# Patient Record
Sex: Female | Born: 1945
Health system: Southern US, Community
[De-identification: ages and names within clinical notes are randomized; demographics above are authoritative.]

## PROBLEM LIST (undated history)

## (undated) DIAGNOSIS — T451X5A Adverse effect of antineoplastic and immunosuppressive drugs, initial encounter: Secondary | ICD-10-CM

## (undated) DIAGNOSIS — E785 Hyperlipidemia, unspecified: Secondary | ICD-10-CM

## (undated) DIAGNOSIS — R234 Changes in skin texture: Secondary | ICD-10-CM

## (undated) DIAGNOSIS — F419 Anxiety disorder, unspecified: Secondary | ICD-10-CM

## (undated) DIAGNOSIS — R7303 Prediabetes: Secondary | ICD-10-CM

## (undated) DIAGNOSIS — E079 Disorder of thyroid, unspecified: Secondary | ICD-10-CM

## (undated) DIAGNOSIS — T7840XA Allergy, unspecified, initial encounter: Secondary | ICD-10-CM

## (undated) DIAGNOSIS — C801 Malignant (primary) neoplasm, unspecified: Secondary | ICD-10-CM

## (undated) DIAGNOSIS — I10 Essential (primary) hypertension: Secondary | ICD-10-CM

## (undated) DIAGNOSIS — G4700 Insomnia, unspecified: Secondary | ICD-10-CM

## (undated) DIAGNOSIS — E039 Hypothyroidism, unspecified: Secondary | ICD-10-CM

## (undated) DIAGNOSIS — L270 Generalized skin eruption due to drugs and medicaments taken internally: Secondary | ICD-10-CM

## (undated) DIAGNOSIS — C3492 Malignant neoplasm of unspecified part of left bronchus or lung: Principal | ICD-10-CM

## (undated) HISTORY — DX: Insomnia, unspecified: G47.00

## (undated) HISTORY — DX: Hyperlipidemia, unspecified: E78.5

## (undated) HISTORY — DX: Disorder of thyroid, unspecified: E07.9

## (undated) HISTORY — DX: Allergy, unspecified, initial encounter: T78.40XA

## (undated) HISTORY — DX: Prediabetes: R73.03

## (undated) HISTORY — DX: Changes in skin texture: R23.4

## (undated) HISTORY — DX: Generalized skin eruption due to drugs and medicaments taken internally: L27.0

## (undated) HISTORY — DX: Anxiety disorder, unspecified: F41.9

## (undated) HISTORY — PX: CYST REMOVAL HAND: SHX6279

## (undated) HISTORY — DX: Essential (primary) hypertension: I10

## (undated) HISTORY — DX: Malignant neoplasm of unspecified part of left bronchus or lung: C34.92

## (undated) HISTORY — DX: Adverse effect of antineoplastic and immunosuppressive drugs, initial encounter: T45.1X5A

## (undated) HISTORY — PX: TONSILLECTOMY AND ADENOIDECTOMY: SUR1326

---

## 1993-10-23 HISTORY — PX: ABDOMINAL HYSTERECTOMY: SHX81

## 1998-04-07 ENCOUNTER — Ambulatory Visit (HOSPITAL_COMMUNITY): Admission: RE | Admit: 1998-04-07 | Discharge: 1998-04-07 | Payer: Self-pay | Admitting: *Deleted

## 1999-06-03 ENCOUNTER — Other Ambulatory Visit: Admission: RE | Admit: 1999-06-03 | Discharge: 1999-06-03 | Payer: Self-pay | Admitting: Orthopedic Surgery

## 2001-06-20 ENCOUNTER — Ambulatory Visit (HOSPITAL_COMMUNITY): Admission: RE | Admit: 2001-06-20 | Discharge: 2001-06-20 | Payer: Self-pay | Admitting: Internal Medicine

## 2001-06-20 ENCOUNTER — Encounter: Payer: Self-pay | Admitting: Internal Medicine

## 2002-10-27 ENCOUNTER — Ambulatory Visit (HOSPITAL_COMMUNITY): Admission: RE | Admit: 2002-10-27 | Discharge: 2002-10-27 | Payer: Self-pay | Admitting: Gastroenterology

## 2003-11-06 ENCOUNTER — Emergency Department (HOSPITAL_COMMUNITY): Admission: AD | Admit: 2003-11-06 | Discharge: 2003-11-06 | Payer: Self-pay | Admitting: Family Medicine

## 2010-02-09 ENCOUNTER — Other Ambulatory Visit: Admission: RE | Admit: 2010-02-09 | Discharge: 2010-02-09 | Payer: Self-pay | Admitting: Internal Medicine

## 2010-02-09 LAB — HM PAP SMEAR: HM Pap smear: NORMAL

## 2011-03-10 NOTE — Op Note (Signed)
   NAMELEITHA, Elizabeth [name]                         ACCOUNT NO.:  [id number]   MEDICAL RECORD NO.:  [id number]                   PATIENT TYPE:  AMB   LOCATION:  ENDO                                 FACILITY:  [facility]   PHYSICIAN:  John C. Madilyn Fireman, M.D.                 DATE OF BIRTH:  22-May-1946   DATE OF PROCEDURE:  10/27/2002  DATE OF DISCHARGE:                                 OPERATIVE REPORT   PROCEDURE:  Colonoscopy.   INDICATION FOR PROCEDURE:  Colon cancer screening in a 65 year old patient  with no prior colon screening.   DESCRIPTION OF PROCEDURE:  The patient was placed in the left lateral  decubitus position and placed on the pulse monitor with continuous low-flow  oxygen delivered by nasal cannula.  She was sedated with 75 mcg IV fentanyl  and 8 mg IV Versed.  The Olympus video colonoscope was inserted into the  rectum and advanced to the cecum, confirmed by transillumination of  McBurney's point, under visualization to the ileocecal valve and appendiceal  orifice.  The prep was excellent.  The cecum, ascending, transverse,  descending, and sigmoid colon all appeared normal with no masses, polyps,  diverticula, or other mucosal abnormalities.  The rectum likewise appeared  normal, and retroflexed view of the anus revealed no obvious internal  hemorrhoids.  The colonoscope was then withdrawn and the patient returned to  the recovery room in stable condition.  She tolerated the procedure well,  and there were no immediate complications.   IMPRESSION:  Normal colonoscopy.   PLAN:  Next colon cancer screening by flexible sigmoidoscopy in five years.                                               John C. Madilyn Fireman, M.D.    JCH/MEDQ  D:  10/27/2002  T:  10/27/2002  Job:  [id number]   cc:   Lovenia Kim, D.O.  [address], [address]  [place]  [place] [address]  Fax: [phone]

## 2012-08-01 LAB — HM DEXA SCAN: HM Dexa Scan: NORMAL

## 2013-07-31 ENCOUNTER — Ambulatory Visit (HOSPITAL_COMMUNITY)
Admission: RE | Admit: 2013-07-31 | Discharge: 2013-07-31 | Disposition: A | Discharge status: Home / Self Care | Payer: 59 | Source: Ambulatory Visit | Attending: Emergency Medicine | Admitting: Emergency Medicine

## 2013-07-31 ENCOUNTER — Other Ambulatory Visit (HOSPITAL_COMMUNITY): Payer: Self-pay | Admitting: Emergency Medicine

## 2013-07-31 DIAGNOSIS — R059 Cough, unspecified: Secondary | ICD-10-CM

## 2013-07-31 DIAGNOSIS — R079 Chest pain, unspecified: Secondary | ICD-10-CM | POA: Insufficient documentation

## 2013-07-31 DIAGNOSIS — R05 Cough: Secondary | ICD-10-CM

## 2013-07-31 DIAGNOSIS — R0602 Shortness of breath: Secondary | ICD-10-CM | POA: Insufficient documentation

## 2013-08-14 LAB — HM MAMMOGRAPHY: HM Mammogram: NORMAL

## 2013-08-28 ENCOUNTER — Telehealth: Payer: Self-pay | Admitting: Emergency Medicine

## 2013-08-28 DIAGNOSIS — G4700 Insomnia, unspecified: Secondary | ICD-10-CM

## 2013-08-28 MED ORDER — ZOLPIDEM TARTRATE 5 MG PO TABS: 5.0000 mg | ORAL_TABLET | Freq: Every evening | ORAL | Status: DC | PRN

## 2013-08-28 NOTE — Telephone Encounter (Deleted)
Controlled fax refill for zolpidem 5mg  tab qty 30 [facility] at [address] tele # [phone]

## 2013-08-28 NOTE — Telephone Encounter (Signed)
Controlled fax refill for zolpidem 5mg  tab qty30 [facility] at [address] tele # [phone]

## 2013-09-03 ENCOUNTER — Other Ambulatory Visit: Payer: Self-pay | Admitting: Physician Assistant

## 2013-09-03 MED ORDER — ALPRAZOLAM 1 MG PO TABS: 1.0000 mg | ORAL_TABLET | Freq: Every evening | ORAL | Status: DC | PRN

## 2013-09-15 ENCOUNTER — Telehealth: Payer: Self-pay | Admitting: Internal Medicine

## 2013-09-15 ENCOUNTER — Other Ambulatory Visit: Payer: Self-pay | Admitting: Internal Medicine

## 2013-09-15 MED ORDER — LEVOTHYROXINE SODIUM 50 MCG PO TABS: ORAL_TABLET | ORAL | Status: DC

## 2013-09-15 NOTE — Telephone Encounter (Signed)
PT. REQUESTING NEW RX ON LEVOTHRYOXINE 5MG  SINCE YOU CHANGED DOSE TO THE FOLLOWING. 1 PILL Monday,WEDNESDAY,FRIDAY. 1 1/2 Sunday,TUESDAY,THURSDAY, Sunday  PHARM: [facility] [address]  PLEASE ADVISE PT IF THIS IS WHAT YOU WANT HER TAKING FROM NOW ON.   NEXT APPT: 10-21-13  @ 9:45AM WITH DR MCK  CPE: 07-30-14 WITH MELISSA SMITH

## 2013-09-16 ENCOUNTER — Telehealth: Payer: Self-pay | Admitting: *Deleted

## 2013-09-28 ENCOUNTER — Other Ambulatory Visit: Payer: Self-pay | Admitting: Emergency Medicine

## 2013-09-28 ENCOUNTER — Other Ambulatory Visit: Payer: Self-pay | Admitting: Internal Medicine

## 2013-10-01 ENCOUNTER — Other Ambulatory Visit: Payer: Self-pay | Admitting: Physician Assistant

## 2013-10-02 NOTE — Telephone Encounter (Signed)
DONE

## 2013-10-17 DIAGNOSIS — R7309 Other abnormal glucose: Secondary | ICD-10-CM | POA: Insufficient documentation

## 2013-10-17 DIAGNOSIS — F419 Anxiety disorder, unspecified: Secondary | ICD-10-CM | POA: Insufficient documentation

## 2013-10-17 DIAGNOSIS — G4700 Insomnia, unspecified: Secondary | ICD-10-CM | POA: Insufficient documentation

## 2013-10-21 ENCOUNTER — Ambulatory Visit (INDEPENDENT_AMBULATORY_CARE_PROVIDER_SITE_OTHER): Payer: 59 | Admitting: Internal Medicine

## 2013-10-21 ENCOUNTER — Encounter: Payer: Self-pay | Admitting: Internal Medicine

## 2013-10-21 VITALS — BP 120/74 | HR 76 | Temp 96.3°F | Resp 16 | Wt 170.4 lb

## 2013-10-21 DIAGNOSIS — E039 Hypothyroidism, unspecified: Secondary | ICD-10-CM | POA: Insufficient documentation

## 2013-10-21 DIAGNOSIS — Z79899 Other long term (current) drug therapy: Secondary | ICD-10-CM

## 2013-10-21 DIAGNOSIS — E782 Mixed hyperlipidemia: Secondary | ICD-10-CM

## 2013-10-21 DIAGNOSIS — M545 Low back pain, unspecified: Secondary | ICD-10-CM

## 2013-10-21 DIAGNOSIS — G4700 Insomnia, unspecified: Secondary | ICD-10-CM

## 2013-10-21 DIAGNOSIS — I10 Essential (primary) hypertension: Secondary | ICD-10-CM

## 2013-10-21 DIAGNOSIS — R7309 Other abnormal glucose: Secondary | ICD-10-CM

## 2013-10-21 DIAGNOSIS — E559 Vitamin D deficiency, unspecified: Secondary | ICD-10-CM

## 2013-10-21 LAB — CBC WITH DIFFERENTIAL/PLATELET
Eosinophils Relative: 3 % (ref 0–5)
HCT: 41.3 % (ref 36.0–46.0)
Hemoglobin: 14.1 g/dL (ref 12.0–15.0)
Lymphocytes Relative: 27 % (ref 12–46)
MCH: 28.8 pg (ref 26.0–34.0)
MCV: 84.3 fL (ref 78.0–100.0)
Monocytes Absolute: 0.7 10*3/uL (ref 0.1–1.0)
Monocytes Relative: 10 % (ref 3–12)
Neutro Abs: 4.1 10*3/uL (ref 1.7–7.7)
Platelets: 205 10*3/uL (ref 150–400)
RDW: 13.3 % (ref 11.5–15.5)
WBC: 7 10*3/uL (ref 4.0–10.5)

## 2013-10-21 LAB — BASIC METABOLIC PANEL WITH GFR
BUN: 13 mg/dL (ref 6–23)
CO2: 31 mEq/L (ref 19–32)
Chloride: 101 mEq/L (ref 96–112)
Creat: 0.93 mg/dL (ref 0.50–1.10)
Glucose, Bld: 69 mg/dL — ABNORMAL LOW (ref 70–99)
Potassium: 4 mEq/L (ref 3.5–5.3)

## 2013-10-21 LAB — LIPID PANEL
HDL: 37 mg/dL — ABNORMAL LOW (ref >39)
LDL Cholesterol: 62 mg/dL (ref 0–99)
Total CHOL/HDL Ratio: 3 Ratio
Triglycerides: 53 mg/dL (ref <150)
VLDL: 11 mg/dL (ref 0–40)

## 2013-10-21 LAB — HEPATIC FUNCTION PANEL
AST: 22 U/L (ref 0–37)
Albumin: 3.9 g/dL (ref 3.5–5.2)
Alkaline Phosphatase: 65 U/L (ref 39–117)
Indirect Bilirubin: 0.6 mg/dL (ref 0.0–0.9)
Total Bilirubin: 0.8 mg/dL (ref 0.3–1.2)

## 2013-10-21 LAB — TSH: TSH: 3.244 u[IU]/mL (ref 0.350–4.500)

## 2013-10-21 LAB — MAGNESIUM: Magnesium: 2 mg/dL (ref 1.5–2.5)

## 2013-10-21 MED ORDER — MELOXICAM 15 MG PO TABS: ORAL_TABLET | ORAL | Status: DC

## 2013-10-21 MED ORDER — LORAZEPAM 2 MG PO TABS: ORAL_TABLET | ORAL | Status: DC

## 2013-10-21 NOTE — Progress Notes (Signed)
Patient ID: Elizabeth [name], female   DOB: Mar 23, 1946, 67 y.o.   MRN: [id number]   This very nice 67 y.o. DWF presents for 3 month follow up with Hypertension, Hyperlipidemia, Pre-Diabetes and Vitamin D Deficiency.    BP predates since 1997 and has been controlled at home. Last labs showed BUN/Creat 16/0.95 with calc GFR 63 in mild renal insufficiency range.Today's BP is 120/74.Marland Kitchen Patient denies any cardiac type chest pain, palpitations, dyspnea/orthopnea/PND, dizziness, claudication, or dependent edema.   Hyperlipidemia is controlled with diet & meds. Last Cholesterol was 123, Triglycerides were  64, HDL 41 and LDL 69 -all at goal. Patient denies myalgias or other med SE's. Other problems include compensated Hypothyroidism, anxiety and insomniq, and climacteric.    Also, the patient has history of elevated glucose with last A1c of 5.5% and elevated insulin of 50 in May 2012 showing Insulin Resistance. Patient denies any symptoms of reactive hypoglycemia, diabetic polys, paresthesias or visual blurring.   Further, Patient has history of Vitamin D Deficiency with last vitamin D of 75 in May this year. Patient supplements vitamin D without any suspected side-effects.  Medication Sig Dispense Refill  . ALPRAZolam (XANAX) 1 MG tablet TAKE 1/2 TO 1 TABLET BY MOUTH THREE TIMES DAILY AS NEEDED  90 tablet  0  . estrogen-methylTESTOSTERone 0.625-1.25 MG per tablet Take 1 tablet by mouth daily.      Marland Kitchen levothyroxine (SYNTHROID, LEVOTHROID) 50 MCG tablet Take 1 tablet 3x wk on MWF And take 1 & 1/2 tab 4x wk on TThSS or as directed  135 tablet  99  . montelukast (SINGULAIR) 10 MG tablet Take 10 mg by mouth at bedtime.      . simvastatin (ZOCOR) 20 MG tablet Take 20 mg by mouth daily.      Marland Kitchen triamterene-hydrochlorothiazide (MAXZIDE) 75-50 MG per tablet TAKE 1 TABLET BY MOUTH DAILY  30 tablet  0     Allergies  Allergen Reactions  . Penicillins Swelling and Rash    PMHx:   Past Medical History  Diagnosis  Date  . Hypertension   . Hyperlipidemia   . Thyroid disease   . Prediabetes   . Allergy   . Anxiety   . Insomnia     FHx:    Reviewed / unchanged  SHx:    Reviewed / unchanged  Systems Review: Constitutional: Denies fever, chills, wt changes, headaches, insomnia, fatigue, night sweats, change in appetite. Eyes: Denies redness, blurred vision, diplopia, discharge, itchy, watery eyes.  ENT: Denies discharge, congestion, post nasal drip, epistaxis, sore throat, earache, hearing loss, dental pain, tinnitus, vertigo, sinus pain, snoring.  CV: Denies chest pain, palpitations, irregular heartbeat, syncope, dyspnea, diaphoresis, orthopnea, PND, claudication, edema. Respiratory: denies cough, dyspnea, DOE, pleurisy, hoarseness, laryngitis, wheezing.  Gastrointestinal: Denies dysphagia, odynophagia, heartburn, reflux, water brash, abdominal pain or cramps, nausea, vomiting, bloating, diarrhea, constipation, hematemesis, melena, hematochezia,  or hemorrhoids. Genitourinary: Denies dysuria, frequency, urgency, nocturia, hesitancy, discharge, hematuria, flank pain. Musculoskeletal: Denies arthralgias, myalgias, stiffness, jt. swelling, pain, limp, strain/sprain.  Skin: Denies pruritus, rash, hives, warts, acne, eczema, change in skin lesion(s). Neuro: No weakness, tremor, incoordination, spasms, paresthesia, or pain. Psychiatric: Denies confusion, memory loss, or sensory loss. Endo: Denies change in weight, skin, hair change.  Heme/Lymph: No excessive bleeding, bruising, orenlarged lymph nodes.  BP: 120/74  Pulse: 76  Temp: 96.3 F (35.7 C)  Resp: 16      On Exam: Appears well nourished - in no distress. Eyes: PERRLA, EOMs, conjunctiva no swelling or erythema.  Sinuses: No frontal/maxillary tenderness ENT/Mouth: EAC's clear, TM's nl w/o erythema, bulging. Nares clear w/o erythema, swelling, exudates. Oropharynx clear without erythema or exudates. Oral hygiene is good. Tongue normal, non  obstructing. Hearing intact.  Neck: Supple. Thyroid nl. Car 2+/2+ without bruits, nodes or JVD. Chest: Respirations nl with BS clear & equal w/o rales, rhonchi, wheezing or stridor.  Cor: Heart sounds normal w/ regular rate and rhythm without sig. murmurs, gallops, clicks, or rubs. Peripheral pulses normal and equal  without edema.  Abdomen: Soft & bowel sounds normal. Non-tender w/o guarding, rebound, hernias, masses, or organomegaly.  Lymphatics: Unremarkable.  Musculoskeletal: Full ROM all peripheral extremities, joint stability, 5/5 strength, and normal gait.  Skin: Warm, dry without exposed rashes, lesions, ecchymosis apparent.  Neuro: Cranial nerves intact, reflexes equal bilaterally. Sensory-motor testing grossly intact. Tendon reflexes grossly intact.  Pysch: Alert & oriented x 3. Insight and judgement nl & appropriate. No ideations.  Assessment and Plan:  1. Hypertension - Continue monitor blood pressure at home. Continue diet/meds same.  2. Hyperlipidemia - Continue diet/meds, exercise,& lifestyle modifications. Continue monitor periodic cholesterol/liver & renal functions   3. Pre-diabetes/Insulin Resistance - Continue diet, exercise, lifestyle modifications. Monitor appropriate labs  4. Vitamin D Deficiency - Continue supplementation.  Recommended regular exercise, BP monitoring, weight control, and discussed med and SE's. Recommended labs to assess and monitor clinical status. Further disposition pending results of labs.

## 2013-10-21 NOTE — Patient Instructions (Signed)

## 2013-10-22 LAB — INSULIN, FASTING: Insulin fasting, serum: 9 u[IU]/mL (ref 3–28)

## 2013-11-10 ENCOUNTER — Other Ambulatory Visit: Payer: Self-pay | Admitting: Internal Medicine

## 2013-11-23 ENCOUNTER — Other Ambulatory Visit: Payer: Self-pay | Admitting: Emergency Medicine

## 2013-11-24 ENCOUNTER — Other Ambulatory Visit: Payer: Self-pay | Admitting: Emergency Medicine

## 2013-12-02 ENCOUNTER — Ambulatory Visit (INDEPENDENT_AMBULATORY_CARE_PROVIDER_SITE_OTHER): Payer: 59 | Admitting: Emergency Medicine

## 2013-12-02 VITALS — BP 130/72 | HR 72 | Temp 95.0°F | Resp 16 | Wt 169.0 lb

## 2013-12-02 DIAGNOSIS — J329 Chronic sinusitis, unspecified: Secondary | ICD-10-CM

## 2013-12-02 DIAGNOSIS — J309 Allergic rhinitis, unspecified: Secondary | ICD-10-CM

## 2013-12-02 MED ORDER — AZITHROMYCIN 250 MG PO TABS: ORAL_TABLET | ORAL | Status: AC

## 2013-12-02 MED ORDER — PREDNISONE 10 MG PO TABS: ORAL_TABLET | ORAL | Status: DC

## 2013-12-02 MED ORDER — ALPRAZOLAM 1 MG PO TABS: ORAL_TABLET | ORAL | Status: DC

## 2013-12-02 NOTE — Progress Notes (Signed)
Subjective:    Patient ID: Elizabeth [name], female    DOB: October 02, 1946, 68 y.o.   MRN: [id number]  HPI Comments: 68 yo female with increased right ear pain x 3 days. Sinus congestion on/off x 1 month. She has been taking Allegra OTC/ Mucinex/ H2o with out relief. She notes symptoms always worse with weather change. She denies any drainage. She has been feeling more fatigued. She notes dry cough and cold sensation over the weekend.   Sinusitis Associated symptoms include chills, congestion, coughing and sinus pressure.   Current Outpatient Prescriptions on File Prior to Visit  Medication Sig Dispense Refill  . aspirin 81 MG tablet Take 81 mg by mouth daily.      Marland Kitchen estrogen-methylTESTOSTERone 0.625-1.25 MG per tablet Take 1 tablet by mouth daily.      Marland Kitchen levothyroxine (SYNTHROID, LEVOTHROID) 50 MCG tablet Take 1 tablet 3x wk on MWF And take 1 & 1/2 tab 4x wk on TThSS or as directed  135 tablet  99  . meloxicam (MOBIC) 15 MG tablet 1/2 to 1 tablet daily with food for pain and inflammation  90 tablet  99  . montelukast (SINGULAIR) 10 MG tablet Take 10 mg by mouth at bedtime.      . simvastatin (ZOCOR) 20 MG tablet TAKE 1 TABLET BY MOUTH EVERY NIGHT AT BEDTIME  90 tablet  0  . triamterene-hydrochlorothiazide (MAXZIDE) 75-50 MG per tablet TAKE 1 TABLET BY MOUTH DAILY  90 tablet  1  . zolpidem (AMBIEN) 5 MG tablet TAKE 1 TABLET BY MOUTH EVERY DAY AT BEDTIME  30 tablet  0  . LORazepam (ATIVAN) 2 MG tablet 1/2 to 1 tablet at bedtime if needed for sleep  30 tablet  5   No current facility-administered medications on file prior to visit.   Allergies  Allergen Reactions  . Penicillins Swelling and Rash   Past Medical History  Diagnosis Date  . Hypertension   . Hyperlipidemia   . Thyroid disease   . Prediabetes   . Allergy   . Anxiety   . Insomnia       Review of Systems  Constitutional: Positive for chills and fatigue.  HENT: Positive for congestion, postnasal drip and sinus pressure.    Respiratory: Positive for cough.   All other systems reviewed and are negative.   BP 130/72  Pulse 72  Temp(Src) 95 F (35 C) (Temporal)  Resp 16  Wt 169 lb (76.658 kg)     Objective:   Physical Exam  Nursing note and vitals reviewed. Constitutional: She is oriented to person, place, and time. She appears well-developed and well-nourished.  HENT:  Head: Normocephalic and atraumatic.  Right Ear: External ear normal.  Left Ear: External ear normal.  Nose: Nose normal.  Mouth/Throat: Oropharynx is clear and moist. No oropharyngeal exudate.  Yellow TMs bilateral Maxillary tenderness   Eyes: Conjunctivae and EOM are normal.  Neck: Normal range of motion.  Cardiovascular: Normal rate, regular rhythm, normal heart sounds and intact distal pulses.   Pulmonary/Chest: Effort normal and breath sounds normal.  Musculoskeletal: Normal range of motion.  Lymphadenopathy:    She has no cervical adenopathy.  Neurological: She is alert and oriented to person, place, and time.  Skin: Skin is warm and dry.  Psychiatric: She has a normal mood and affect. Judgment normal.          Assessment & Plan:  Sinusitis/ Allergic rhinitis- Nasonex NS AD SX #1, Allegra OTC, increase H2o, allergy hygiene explained.  ZPAK/ Pred DP both AD, w/c if SX increase

## 2013-12-02 NOTE — Patient Instructions (Signed)
Sinusitis Sinusitis is redness, soreness, and puffiness (inflammation) of the air pockets in the bones of your face (sinuses). The redness, soreness, and puffiness can cause air and mucus to get trapped in your sinuses. This can allow germs to grow and cause an infection.  HOME CARE   Drink enough fluids to keep your pee (urine) clear or pale yellow.  Use a humidifier in your home.  Run a hot shower to create steam in the bathroom. Sit in the bathroom with the door closed. Breathe in the steam 3 4 times a day.  Put a warm, moist washcloth on your face 3 4 times a day, or as told by your doctor.  Use salt water sprays (saline sprays) to wet the thick fluid in your nose. This can help the sinuses drain.  Only take medicine as told by your doctor. GET HELP RIGHT AWAY IF:   Your pain gets worse.  You have very bad headaches.  You are sick to your stomach (nauseous).  You throw up (vomit).  You are very sleepy (drowsy) all the time.  Your face is puffy (swollen).  Your vision changes.  You have a stiff neck.  You have trouble breathing. MAKE SURE YOU:   Understand these instructions.  Will watch your condition.  Will get help right away if you are not doing well or get worse. Document Released: 03/27/2008 Document Revised: 07/03/2012 Document Reviewed: 05/14/2012 [facility] Patient Information 2014 [place].

## 2013-12-03 ENCOUNTER — Encounter: Payer: Self-pay | Admitting: Emergency Medicine

## 2013-12-22 ENCOUNTER — Other Ambulatory Visit: Payer: Self-pay | Admitting: Emergency Medicine

## 2014-01-20 ENCOUNTER — Ambulatory Visit (INDEPENDENT_AMBULATORY_CARE_PROVIDER_SITE_OTHER): Payer: 59 | Admitting: Emergency Medicine

## 2014-01-20 ENCOUNTER — Encounter: Payer: Self-pay | Admitting: Emergency Medicine

## 2014-01-20 VITALS — BP 124/80 | HR 72 | Temp 98.2°F | Resp 18 | Ht 60.0 in | Wt 166.0 lb

## 2014-01-20 DIAGNOSIS — J309 Allergic rhinitis, unspecified: Secondary | ICD-10-CM

## 2014-01-20 DIAGNOSIS — Z0000 Encounter for general adult medical examination without abnormal findings: Secondary | ICD-10-CM

## 2014-01-20 DIAGNOSIS — I10 Essential (primary) hypertension: Secondary | ICD-10-CM

## 2014-01-20 DIAGNOSIS — R7309 Other abnormal glucose: Secondary | ICD-10-CM

## 2014-01-20 DIAGNOSIS — E782 Mixed hyperlipidemia: Secondary | ICD-10-CM

## 2014-01-20 DIAGNOSIS — Z1331 Encounter for screening for depression: Secondary | ICD-10-CM

## 2014-01-20 DIAGNOSIS — Z789 Other specified health status: Secondary | ICD-10-CM

## 2014-01-20 LAB — CBC WITH DIFFERENTIAL/PLATELET
BASOS PCT: 1 % (ref 0–1)
Basophils Absolute: 0.1 10*3/uL (ref 0.0–0.1)
EOS ABS: 0.1 10*3/uL (ref 0.0–0.7)
Eosinophils Relative: 2 % (ref 0–5)
HEMATOCRIT: 42.2 % (ref 36.0–46.0)
HEMOGLOBIN: 14.2 g/dL (ref 12.0–15.0)
Lymphocytes Relative: 25 % (ref 12–46)
Lymphs Abs: 1.8 10*3/uL (ref 0.7–4.0)
MCH: 29 pg (ref 26.0–34.0)
MCHC: 33.6 g/dL (ref 30.0–36.0)
MCV: 86.1 fL (ref 78.0–100.0)
MONO ABS: 0.7 10*3/uL (ref 0.1–1.0)
Monocytes Relative: 10 % (ref 3–12)
Neutro Abs: 4.4 10*3/uL (ref 1.7–7.7)
Neutrophils Relative %: 62 % (ref 43–77)
Platelets: 201 10*3/uL (ref 150–400)
RBC: 4.9 MIL/uL (ref 3.87–5.11)
RDW: 14.3 % (ref 11.5–15.5)
WBC: 7.1 10*3/uL (ref 4.0–10.5)

## 2014-01-20 LAB — HEMOGLOBIN A1C
Hgb A1c MFr Bld: 5.7 % — ABNORMAL HIGH (ref <5.7)
Mean Plasma Glucose: 117 mg/dL — ABNORMAL HIGH (ref <117)

## 2014-01-20 MED ORDER — ZOLPIDEM TARTRATE 5 MG PO TABS
ORAL_TABLET | ORAL | Status: DC
Start: 2014-01-20 — End: 2014-04-20

## 2014-01-20 NOTE — Patient Instructions (Addendum)
Allergic Rhinitis Allergic rhinitis is when the mucous membranes in the nose respond to allergens. Allergens are particles in the air that cause your body to have an allergic reaction. This causes you to release allergic antibodies. Through a chain of events, these eventually cause you to release histamine into the blood stream. Although meant to protect the body, it is this release of histamine that causes your discomfort, such as frequent sneezing, congestion, and an itchy, runny nose.  CAUSES  Seasonal allergic rhinitis (hay fever) is caused by pollen allergens that may come from grasses, trees, and weeds. Year-round allergic rhinitis (perennial allergic rhinitis) is caused by allergens such as house dust mites, pet dander, and mold spores.  SYMPTOMS   Nasal stuffiness (congestion).  Itchy, runny nose with sneezing and tearing of the eyes. DIAGNOSIS  Your health care provider can help you determine the allergen or allergens that trigger your symptoms. If you and your health care provider are unable to determine the allergen, skin or blood testing may be used. TREATMENT  Allergic Rhinitis does not have a cure, but it can be controlled by:  Medicines and allergy shots (immunotherapy).  Avoiding the allergen. Hay fever may often be treated with antihistamines in pill or nasal spray forms. Antihistamines block the effects of histamine. There are over-the-counter medicines that may help with nasal congestion and swelling around the eyes. Check with your health care provider before taking or giving this medicine.  If avoiding the allergen or the medicine prescribed do not work, there are many new medicines your health care provider can prescribe. Stronger medicine may be used if initial measures are ineffective. Desensitizing injections can be used if medicine and avoidance does not work. Desensitization is when a patient is given ongoing shots until the body becomes less sensitive to the allergen.  Make sure you follow up with your health care provider if problems continue. HOME CARE INSTRUCTIONS It is not possible to completely avoid allergens, but you can reduce your symptoms by taking steps to limit your exposure to them. It helps to know exactly what you are allergic to so that you can avoid your specific triggers. SEEK MEDICAL CARE IF:   You have a fever.  You develop a cough that does not stop easily (persistent).  You have shortness of breath.  You start wheezing.  Symptoms interfere with normal daily activities. Document Released: 07/04/2001 Document Revised: 07/30/2013 Document Reviewed: 06/16/2013 [facility] Patient Information 2014 [place]. We want weight loss that will last so you should lose 1-2 pounds a week.  THAT IS IT! Please pick THREE things a month to change. Once it is a habit check off the item. Then pick another three items off the list to become habits.  If you are already doing a habit on the list GREAT!  Cross that item off! o Don't drink your calories. Ie, alcohol, soda, fruit juice, and sweet tea.  o Drink more water. Drink a glass when you feel hungry or before each meal.  o Eat breakfast - Complex carb and protein (likeDannon light and fit yogurt, oatmeal, fruit, eggs, [place] bacon). o Measure your cereal.  Eat no more than one cup a day. (ie [place]) o Eat an apple a day. o Add a vegetable a day. o Try a new vegetable a month. o Use Pam! Stop using oil or butter to cook. o Don't finish your plate or use smaller plates. o Share your dessert. o Eat sugar free Jello for dessert or frozen  grapes. o Don't eat 2-3 hours before bed. o Switch to whole wheat bread, pasta, and brown rice. o Make healthier choices when you eat out. No fries! o Pick baked chicken, NOT fried. o Don't forget to SLOW DOWN when you eat. It is not going anywhere.  o Take the stairs. o Park far away in the parking lot o [employer] (or weights) for 10 minutes while  watching TV. o Walk at work for 10 minutes during break. o Walk outside 1 time a week with your friend, kids, dog, or significant other. o Start a walking group at [place] the mall as much as you can tolerate.  o Keep a food diary. o Weigh yourself daily. o Walk for 15 minutes 3 days per week. o Cook at home more often and eat out less.  If life happens and you go back to old habits, it is okay.  Just start over. You can do it!   If you experience chest pain, get short of breath, or tired during the exercise, please stop immediately and inform your doctor.    Bad carbs also include fruit juice, alcohol, and sweet tea. These are empty calories that do not signal to your brain that you are full.   Please remember the good carbs are still carbs which convert into sugar. So please measure them out no more than 1/2-1 cup of rice, oatmeal, pasta, and beans.  Veggies are however free foods! Pile them on.   I like lean protein at every meal such as chicken, [place], pork chops, cottage cheese, etc. Just do not fry these meats and please center your meal around vegetable, the meats should be a side dish.   No all fruit is created equal. Please see the list below, the fruit at the bottom is higher in sugars than the fruit at the top

## 2014-01-20 NOTE — Progress Notes (Signed)
Patient ID: Elizabeth [name], female   DOB: [birth year], 68 y.o.   MRN: [id number] Subjective:   Elizabeth [name] is a 68 y.o. female who presents for Medicare Annual Wellness Visit and 3 month follow up on hypertension, prediabetes, hyperlipidemia, vitamin D def.  Date of last medicare wellness visit is unknown.   Her blood pressure has been controlled at home, today their BP is BP: 124/80 mmHg She does workout. She denies chest pain, shortness of breath, dizziness.  She is on cholesterol medication and denies myalgias. Her cholesterol is at goal. The cholesterol last visit was:   Lab Results  Component Value Date   CHOL 121 01/20/2014   HDL 46 01/20/2014   LDLCALC 65 01/20/2014   TRIG 50 01/20/2014   CHOLHDL 2.6 01/20/2014   She has been working on diet and exercise for prediabetes, and denies foot ulcerations, increased appetite, polyuria and visual disturbances. Last A1C in the office was:  Lab Results  Component Value Date   HGBA1C 5.7* 01/20/2014   Patient is on Vitamin D supplement.  Names of Other Physician/Practitioners you currently use: Patient Care Team: [name], MD as PCP - General (Internal Medicine) Sharol Roussel as Physician Assistant (Optometry) Jettie Booze, MD as Consulting Physician (Interventional Cardiology) Missy Sabins, MD as Consulting Physician (Gastroenterology) Simona Huh, MD as Consulting Physician (Dermatology) Lovena Le, (dentist)  Medication Review Current Outpatient Prescriptions on File Prior to Visit  Medication Sig Dispense Refill  . ALPRAZolam (XANAX) 1 MG tablet TAKE 1/2 TO 1 TABLET BY MOUTH THREE TIMES DAILY AS NEEDED  90 tablet  1  . aspirin 81 MG tablet Take 81 mg by mouth daily.      Marland Kitchen estrogen-methylTESTOSTERone 0.625-1.25 MG per tablet Take 1 tablet by mouth daily.      Marland Kitchen levothyroxine (SYNTHROID, LEVOTHROID) 50 MCG tablet Take 1 tablet 3x wk on MWF And take 1 & 1/2 tab 4x wk on TThSS or as directed  135 tablet  99   . meloxicam (MOBIC) 15 MG tablet 1/2 to 1 tablet daily with food for pain and inflammation  90 tablet  99  . montelukast (SINGULAIR) 10 MG tablet Take 10 mg by mouth at bedtime.      . simvastatin (ZOCOR) 20 MG tablet TAKE 1 TABLET BY MOUTH EVERY NIGHT AT BEDTIME  90 tablet  0  . triamterene-hydrochlorothiazide (MAXZIDE) 75-50 MG per tablet TAKE 1 TABLET BY MOUTH DAILY  90 tablet  1  . LORazepam (ATIVAN) 2 MG tablet 1/2 to 1 tablet at bedtime if needed for sleep  30 tablet  5   No current facility-administered medications on file prior to visit.   Allergies  Allergen Reactions  . Penicillins Swelling and Rash    Current Problems (verified) Patient Active Problem List   Diagnosis Date Noted  . Unspecified vitamin D deficiency 10/21/2013  . Unspecified hypothyroidism 10/21/2013  . Unspecified essential hypertension 10/21/2013  . Mixed hyperlipidemia 10/21/2013  . Other abnormal glucose 10/21/2013  . Hypertension   . Hyperlipidemia   . Prediabetes   . Anxiety   . Insomnia     Screening Tests Health Maintenance  Topic Date Due  . Influenza Vaccine  05/23/2014  . Mammogram  08/15/2015  . Tetanus/tdap  12/26/2015  . Colonoscopy  11/19/2022  . Pneumococcal Polysaccharide Vaccine Age 13 And Over  Completed  . Zostavax  Completed     Immunization History  Administered Date(s) Administered  . Pneumococcal Polysaccharide-23 07/22/2012  . Td  12/25/2005  . Zoster 01/09/2007    Preventative care:  Last colonoscopy: 11/19/12 Last mammogram: 08/14/13 Last pap smear/pelvic exam: 2011   DEXA:2013 CXR : 07/31/13  Prior vaccinations: TD or Tdap:2007  Influenza: 10/ 2014 Pneumococcal: 2013 Shingles/Zostavax: 2008  History reviewed:  Past Medical History  Diagnosis Date  . Hypertension   . Hyperlipidemia   . Thyroid disease   . Prediabetes   . Allergy   . Anxiety   . Insomnia    Past Surgical History  Procedure Laterality Date  . Tonsillectomy and adenoidectomy    .  Abdominal hysterectomy  1995    w BSO   History  Substance Use Topics  . Smoking status: Former Smoker    Quit date: 10/23/1974  . Smokeless tobacco: Never Used  . Alcohol Use: No     Comment: Rare   Family History  Problem Relation Age of Onset  . Liver disease Mother   . ALS Father   . Hypertension Father      Risk Factors: Osteoporosis: postmenopausal estrogen deficiency History of fracture in the past year: no  Tobacco History  Substance Use Topics  . Smoking status: Former Smoker    Quit date: 10/23/1974  . Smokeless tobacco: Never Used  . Alcohol Use: No     Comment: Rare   She does not smoke.  Patient is a former smoker. Are there smokers in your home (other than you)?  No  Alcohol Current alcohol use: none  Caffeine Current caffeine use: tea 1-3 /day  Exercise Exercise limitations: The patient has no exercise limitations. Current exercise: cardiovascular workout on exercise equipment and walking  Nutrition/Diet Current diet: in general, a "healthy" diet    Cardiac risk factors: advanced age (older than 65 for men, 6 for women), dyslipidemia and hypertension.  Depression Screen Nurse depression screen reviewed.  (Note: if answer to either of the following is "Yes", a more complete depression screening is indicated)   Q1: Over the past two weeks, have you felt down, depressed or hopeless? No  Q2: Over the past two weeks, have you felt little interest or pleasure in doing things? No  Have you lost interest or pleasure in daily life? No  Do you often feel hopeless? No  Do you cry easily over simple problems? No  Activities of Daily Living Nurse ADLs screen reviewed.  In your present state of health, do you have any difficulty performing the following activities?:  Driving? No Managing money?  No Feeding yourself? No Getting from bed to chair? No Climbing a flight of stairs? No Preparing food and eating?: No Bathing or showering? No Getting  dressed: No Getting to the toilet? No Using the toilet:No Moving around from place to place: No In the past year have you fallen or had a near fall?:No   Are you sexually active?  Yes  Do you have more than one partner?  No  Vision Difficulties: No  Hearing Difficulties: No Do you often ask people to speak up or repeat themselves? No Do you experience ringing or noises in your ears? No Do you have difficulty understanding soft or whispered voices? No  Cognition  Do you feel that you have a problem with memory?No  Do you often misplace items? No  Do you feel safe at home?  Yes  Advanced directives Does patient have a [place]? Yes, bRAD [place], soN Does patient have a Living Will? Yes    Objective:  Vision and hearing screens reviewed.   Blood pressure 124/80, pulse 72, temperature 98.2 F (36.8 C), temperature source Temporal, resp. rate 18, height 5' (1.524 m), weight 166 lb (75.297 kg). Body mass index is 32.42 kg/(m^2).  General appearance: alert, no distress, WD/WN,  female Cognitive Testing  Alert? Yes  Normal Appearance?Yes  Oriented to person? Yes  Place? Yes   Time? Yes  Recall of three objects?  Yes  Can perform simple calculations? Yes  Displays appropriate judgment?Yes  Can read the correct time from a watch face?Yes  HEENT: normocephalic, sclerae anicteric, TMs pearly, nares patent, no discharge or erythema, pharynx normal Oral cavity: MMM, no lesions Neck: supple, no lymphadenopathy, no thyromegaly, no masses Heart: RRR, normal S1, S2, no murmurs Lungs: CTA bilaterally, no wheezes, rhonchi, or rales Abdomen: +bs, soft, non tender, non distended, no masses, no hepatomegaly, no splenomegaly Musculoskeletal: nontender, no swelling, no obvious deformity Extremities: no edema, no cyanosis, no clubbing Pulses: 2+ symmetric, upper and lower extremities, normal cap refill Neurological: alert, oriented x 3, CN2-12 intact, strength  normal upper extremities and lower extremities, sensation normal throughout, DTRs 2+ throughout, no cerebellar signs, gait normal Psychiatric: normal affect, behavior normal, pleasant  Breast:DEF Gyn:   DEF Rectal: DEF   Assessment:     Plan:   During the course of the visit the patient was educated and counseled about appropriate screening and preventive services including:    Diabetes screening  Nutrition counseling   Screening recommendations, referrals:  Vaccinations: Tdap vaccine no  /up to date  Influenza vaccine no  /up to date  Pneumococcal vaccine no  /up to date   Shingles vaccine no  /up to date Hep B vaccine no  Nutrition assessed and recommended  Colonoscopy no  /up to date  Mammogram no  /up to date  Pap smear no  /up to date  Pelvic exam no  /up to date  Recommended yearly ophthalmology/optometry visit for glaucoma screening and checkup yes Recommended yearly dental visit for hygiene and checkup yes Advanced directives - yes  Conditions/risks identified: BMI: Discussed weight loss, diet, and increase physical activity.  Increase physical activity: AHA recommends 150 minutes of physical activity a week.  Medications reviewed DEXA- not indicated Diabetes is at goal, ACE/ARB therapy: No, Reason not on Ace Inhibitor/ARB therapy:  She is not Diabetic Urinary Incontinence is not an issue: discussed non pharmacology and pharmacology options.  Fall risk: low- discussed PT, home fall assessment, medications.   Medicare Attestation I have personally reviewed: The patient's medical and social history Their use of alcohol, tobacco or illicit drugs Their current medications and supplements The patient's functional ability including ADLs,fall risks, home safety risks, cognitive, and hearing and visual impairment Diet and physical activities Evidence for depression or mood disorders  The patient's weight, height, BMI, and visual acuity have been recorded in the  chart.  I have made referrals, counseling, and provided education to the patient based on review of the above and I have provided the patient with a written personalized care plan for preventive services.     Kelby Aline, R, PA-C   01/22/2014  CPT C5885 first AWV CPT [phone] subsequent AWV

## 2014-01-21 LAB — LIPID PANEL
CHOLESTEROL: 121 mg/dL (ref 0–200)
HDL: 46 mg/dL (ref >39)
LDL Cholesterol: 65 mg/dL (ref 0–99)
TRIGLYCERIDES: 50 mg/dL (ref <150)
Total CHOL/HDL Ratio: 2.6 Ratio
VLDL: 10 mg/dL (ref 0–40)

## 2014-01-21 LAB — BASIC METABOLIC PANEL WITH GFR
BUN: 16 mg/dL (ref 6–23)
CALCIUM: 9.2 mg/dL (ref 8.4–10.5)
CO2: 30 meq/L (ref 19–32)
CREATININE: 1.03 mg/dL (ref 0.50–1.10)
Chloride: 101 mEq/L (ref 96–112)
GFR, EST AFRICAN AMERICAN: 65 mL/min
GFR, Est Non African American: 56 mL/min — ABNORMAL LOW
GLUCOSE: 76 mg/dL (ref 70–99)
Potassium: 4.3 mEq/L (ref 3.5–5.3)
Sodium: 141 mEq/L (ref 135–145)

## 2014-01-21 LAB — INSULIN, FASTING: Insulin fasting, serum: 11 u[IU]/mL (ref 3–28)

## 2014-01-21 LAB — HEPATIC FUNCTION PANEL
ALBUMIN: 4.3 g/dL (ref 3.5–5.2)
ALT: 24 U/L (ref 0–35)
AST: 23 U/L (ref 0–37)
Alkaline Phosphatase: 51 U/L (ref 39–117)
Bilirubin, Direct: 0.3 mg/dL (ref 0.0–0.3)
Indirect Bilirubin: 1 mg/dL (ref 0.2–1.2)
TOTAL PROTEIN: 7 g/dL (ref 6.0–8.3)
Total Bilirubin: 1.3 mg/dL — ABNORMAL HIGH (ref 0.2–1.2)

## 2014-02-07 ENCOUNTER — Other Ambulatory Visit: Payer: Self-pay | Admitting: Internal Medicine

## 2014-02-08 ENCOUNTER — Other Ambulatory Visit: Payer: Self-pay | Admitting: Internal Medicine

## 2014-03-05 ENCOUNTER — Encounter: Payer: Self-pay | Admitting: Emergency Medicine

## 2014-03-06 MED ORDER — ALPRAZOLAM 1 MG PO TABS: ORAL_TABLET | ORAL | Status: DC

## 2014-03-23 ENCOUNTER — Other Ambulatory Visit: Payer: Self-pay | Admitting: Emergency Medicine

## 2014-04-13 ENCOUNTER — Other Ambulatory Visit: Payer: Self-pay | Admitting: Internal Medicine

## 2014-04-20 ENCOUNTER — Ambulatory Visit (INDEPENDENT_AMBULATORY_CARE_PROVIDER_SITE_OTHER): Payer: 59 | Admitting: Physician Assistant

## 2014-04-20 ENCOUNTER — Encounter: Payer: Self-pay | Admitting: Physician Assistant

## 2014-04-20 VITALS — BP 110/62 | HR 68 | Temp 97.7°F | Resp 16 | Ht 60.0 in | Wt 168.0 lb

## 2014-04-20 DIAGNOSIS — E039 Hypothyroidism, unspecified: Secondary | ICD-10-CM

## 2014-04-20 DIAGNOSIS — E559 Vitamin D deficiency, unspecified: Secondary | ICD-10-CM

## 2014-04-20 DIAGNOSIS — I10 Essential (primary) hypertension: Secondary | ICD-10-CM

## 2014-04-20 DIAGNOSIS — R7303 Prediabetes: Secondary | ICD-10-CM

## 2014-04-20 DIAGNOSIS — R7309 Other abnormal glucose: Secondary | ICD-10-CM

## 2014-04-20 DIAGNOSIS — E782 Mixed hyperlipidemia: Secondary | ICD-10-CM

## 2014-04-20 DIAGNOSIS — D509 Iron deficiency anemia, unspecified: Secondary | ICD-10-CM

## 2014-04-20 DIAGNOSIS — E538 Deficiency of other specified B group vitamins: Secondary | ICD-10-CM

## 2014-04-20 DIAGNOSIS — M791 Myalgia, unspecified site: Secondary | ICD-10-CM

## 2014-04-20 LAB — CBC WITH DIFFERENTIAL/PLATELET
BASOS ABS: 0.1 10*3/uL (ref 0.0–0.1)
BASOS PCT: 1 % (ref 0–1)
EOS ABS: 0.1 10*3/uL (ref 0.0–0.7)
Eosinophils Relative: 2 % (ref 0–5)
HCT: 41.7 % (ref 36.0–46.0)
HEMOGLOBIN: 14 g/dL (ref 12.0–15.0)
Lymphocytes Relative: 29 % (ref 12–46)
Lymphs Abs: 1.9 10*3/uL (ref 0.7–4.0)
MCH: 29.2 pg (ref 26.0–34.0)
MCHC: 33.6 g/dL (ref 30.0–36.0)
MCV: 86.9 fL (ref 78.0–100.0)
Monocytes Absolute: 0.7 10*3/uL (ref 0.1–1.0)
Monocytes Relative: 10 % (ref 3–12)
NEUTROS PCT: 58 % (ref 43–77)
Neutro Abs: 3.8 10*3/uL (ref 1.7–7.7)
PLATELETS: 201 10*3/uL (ref 150–400)
RBC: 4.8 MIL/uL (ref 3.87–5.11)
RDW: 13.3 % (ref 11.5–15.5)
WBC: 6.6 10*3/uL (ref 4.0–10.5)

## 2014-04-20 LAB — HEMOGLOBIN A1C
HEMOGLOBIN A1C: 5.6 % (ref <5.7)
Mean Plasma Glucose: 114 mg/dL (ref <117)

## 2014-04-20 NOTE — Progress Notes (Signed)
Assessment and Plan:  Hypertension: Continue medication, monitor blood pressure at home. Continue DASH diet. Cholesterol: Continue diet and exercise. Check cholesterol.  Pre-diabetes-Continue diet and exercise. Check A1C Vitamin D Def- check level and continue medications.  Myalgias/RLS- check labs including CPK  Continue diet and meds as discussed. Further disposition pending results of labs.  HPI 68 y.o. female  presents for 3 month follow up with hypertension, hyperlipidemia, prediabetes and vitamin D. Her blood pressure has been controlled at home, today their BP is BP: 110/62 mmHg She does workout, she has her grand kids during the summer and goes to pool with them.  She denies chest pain, shortness of breath, dizziness.  She is on cholesterol medication and denies myalgias. Her cholesterol is at goal. The cholesterol last visit was:   Lab Results  Component Value Date   CHOL 121 01/20/2014   HDL 46 01/20/2014   LDLCALC 65 01/20/2014   TRIG 50 01/20/2014   CHOLHDL 2.6 01/20/2014   She has been working on diet and exercise for prediabetes, and denies paresthesia of the feet, polydipsia and polyuria. Last A1C in the office was:  Lab Results  Component Value Date   HGBA1C 5.7* 01/20/2014   Patient is on Vitamin D supplement.   Lab Results  Component Value Date   VD25OH 42 10/21/2013   She is on thyroid medication. Her medication was not changed last visit. Patient denies nervousness, palpitations and weight changes.  Lab Results  Component Value Date   TSH 3.244 10/21/2013  .  She states that her legs have been hurting at night, it is better with movement. She does have lower back pain that is worse with standing for a long time. She has been off her Mag and is getting back on it.  She takes the xanax just once daily for nerves and the ativan replaced her ambien for sleep at night.   Current Medications:  Current Outpatient Prescriptions on File Prior to Visit  Medication Sig  Dispense Refill  . ALPRAZolam (XANAX) 1 MG tablet TAKE 1/2 TO 1 TABLET BY MOUTH THREE TIMES DAILY AS NEEDED  90 tablet  1  . aspirin 81 MG tablet Take 81 mg by mouth daily.      Marland Kitchen EST ESTROGENS-METHYLTEST HS 0.625-1.25 MG per tablet TAKE 1 TABLET BY MOUTH ONCE DAILY  90 tablet  0  . levothyroxine (SYNTHROID, LEVOTHROID) 50 MCG tablet Take 1 tablet 3x wk on MWF And take 1 & 1/2 tab 4x wk on TThSS or as directed  135 tablet  99  . LORazepam (ATIVAN) 2 MG tablet 1/2 to 1 tablet at bedtime if needed for sleep  30 tablet  5  . meloxicam (MOBIC) 15 MG tablet 1/2 to 1 tablet daily with food for pain and inflammation  90 tablet  99  . montelukast (SINGULAIR) 10 MG tablet TAKE 1 TABLET BY MOUTH EVERY DAY  30 tablet  2  . simvastatin (ZOCOR) 20 MG tablet TAKE 1 TABLET BY MOUTH EVERY NIGHT AT BEDTIME  90 tablet  0  . triamterene-hydrochlorothiazide (MAXZIDE) 75-50 MG per tablet TAKE 1 TABLET BY MOUTH DAILY  90 tablet  1   No current facility-administered medications on file prior to visit.   Medical History:  Past Medical History  Diagnosis Date  . Hypertension   . Hyperlipidemia   . Thyroid disease   . Prediabetes   . Allergy   . Anxiety   . Insomnia    Allergies:  Allergies  Allergen Reactions  . Penicillins Swelling and Rash     Review of Systems: [X]  = complains of  [ ]  = denies  General: Fatigue [ ]  Fever [ ]  Chills [ ]  Weakness [ ]   Insomnia [ ]  Eyes: Redness [ ]  Blurred vision [ ]  Diplopia [ ]   ENT: Congestion [ ]  Sinus Pain [ ]  Post Nasal Drip [ ]  Sore Throat [ ]  Earache [ ]   Cardiac: Chest pain/pressure [ ]  SOB [ ]  Orthopnea [ ]   Palpitations [ ]   Paroxysmal nocturnal dyspnea[ ]  Claudication [ ]  Edema [ ]   Pulmonary: Cough [ ]  Wheezing[ ]   SOB [ ]   Snoring [ ]   GI: Nausea [ ]  Vomiting[ ]  Dysphagia[ ]  Heartburn[ ]  Abdominal pain [ ]  Constipation [ ] ; Diarrhea [ ] ; BRBPR [ ]  Melena[ ]  GU: Hematuria[ ]  Dysuria [ ]  Nocturia[ ]  Urgency [ ]   Hesitancy [ ]  Discharge [ ]  Neuro:  Headaches[ ]  Vertigo[ ]  Paresthesias[ ]  Spasm [ ]  Speech changes [ ]  Incoordination [ ]   Ortho: Arthritis [ ]  Joint pain [ ]  Muscle pain [ ]  Joint swelling [ ]  Back Pain [ ]  Skin:  Rash [ ]   Pruritis [ ]  Change in skin lesion [ ]   Psych: Depression[ ]  Anxiety[ ]  Confusion [ ]  Memory loss [ ]   Heme/Lypmh: Bleeding [ ]  Bruising [ ]  Enlarged lymph nodes [ ]   Endocrine: Visual blurring [ ]  Paresthesia [ ]  Polyuria [ ]  Polydypsea [ ]    Heat/cold intolerance [ ]  Hypoglycemia [ ]   Family history- Review and unchanged Social history- Review and unchanged Physical Exam: BP 110/62  Pulse 68  Temp(Src) 97.7 F (36.5 C)  Resp 16  Ht 5' (1.524 m)  Wt 168 lb (76.204 kg)  BMI 32.81 kg/m2 Wt Readings from Last 3 Encounters:  04/20/14 168 lb (76.204 kg)  01/20/14 166 lb (75.297 kg)  12/03/13 169 lb (76.658 kg)   General Appearance: Well nourished, in no apparent distress. Eyes: PERRLA, EOMs, conjunctiva no swelling or erythema Sinuses: No Frontal/maxillary tenderness ENT/Mouth: Ext aud canals clear, TMs without erythema, bulging. No erythema, swelling, or exudate on post pharynx.  Tonsils not swollen or erythematous. Hearing normal.  Neck: Supple, thyroid normal.  Respiratory: Respiratory effort normal, BS equal bilaterally without rales, rhonchi, wheezing or stridor.  Cardio: RRR with no MRGs. Brisk peripheral pulses without edema.  Abdomen: Soft, + BS.  Non tender, no guarding, rebound, hernias, masses. Lymphatics: Non tender without lymphadenopathy.  Musculoskeletal: Full ROM, 5/5 strength, normal gait.  Skin: Warm, dry without rashes, lesions, ecchymosis.  Neuro: Cranial nerves intact. Normal muscle tone, no cerebellar symptoms. Sensation intact.  Psych: Awake and oriented X 3, normal affect, Insight and Judgment appropriate.    Vicie Mutters 10:43 AM

## 2014-04-20 NOTE — Patient Instructions (Signed)
   Bad carbs also include fruit juice, alcohol, and sweet tea. These are empty calories that do not signal to your brain that you are full.   Please remember the good carbs are still carbs which convert into sugar. So please measure them out no more than 1/2-1 cup of rice, oatmeal, pasta, and beans.  Veggies are however free foods! Pile them on.   I like lean protein at every meal such as chicken, [place], pork chops, cottage cheese, etc. Just do not fry these meats and please center your meal around vegetable, the meats should be a side dish.   No all fruit is created equal. Please see the list below, the fruit at the bottom is higher in sugars than the fruit at the top   Restless Legs Syndrome Restless legs syndrome is a movement disorder. It may also be called a sensori-motor disorder.  CAUSES  No one knows what specifically causes restless legs syndrome, but it tends to run in families. It is also more common in people with low iron, in pregnancy, in people who need dialysis, and those with nerve damage (neuropathy).Some medications may make restless legs syndrome worse.Those medications include drugs to treat high blood pressure, some heart conditions, nausea, colds, allergies, and depression. SYMPTOMS Symptoms include uncomfortable sensations in the legs. These leg sensations are worse during periods of inactivity or rest. They are also worse while sitting or lying down. Individuals that have the disorder describe sensations in the legs that feel like:  Pulling.  Drawing.  Crawling.  Worming.  Boring.  Tingling.  Pins and needles.  Prickling.  Pain. The sensations are usually accompanied by an overwhelming urge to move the legs. Sudden muscle jerks may also occur. Movement provides temporary relief from the discomfort. In rare cases, the arms may also be affected. Symptoms may interfere with going to sleep (sleep onset insomnia). Restless legs syndrome may also be related to  periodic limb movement disorder (PLMD). PLMD is another more common motor disorder. It also causes interrupted sleep. The symptoms from PLMD usually occur most often when you are awake. TREATMENT  Treatment for restless legs syndrome is symptomatic. This means that the symptoms are treated.   Massage and cold compresses may provide temporary relief.  Walk, stretch, or take a cold or hot bath.  Get regular exercise and a good night's sleep.  Avoid caffeine, alcohol, nicotine, and medications that can make it worse.  Do activities that provide mental stimulation like discussions, needlework, and video games. These may be helpful if you are not able to walk or stretch. Some medications are effective in relieving the symptoms. However, many of these medications have side effects. Ask your caregiver about medications that may help your symptoms. Correcting iron deficiency may improve symptoms for some patients. Document Released: 09/29/2002 Document Revised: 01/01/2012 Document Reviewed: 01/05/2011 [facility] Patient Information 2015 [place], [place]. This information is not intended to replace advice given to you by your health care Ephrem Carrick. Make sure you discuss any questions you have with your health care Macallan Ord.

## 2014-04-21 LAB — FERRITIN: Ferritin: 43 ng/mL (ref 10–291)

## 2014-04-21 LAB — BASIC METABOLIC PANEL WITH GFR
BUN: 18 mg/dL (ref 6–23)
CALCIUM: 8.7 mg/dL (ref 8.4–10.5)
CO2: 30 mEq/L (ref 19–32)
Chloride: 102 mEq/L (ref 96–112)
Creat: 0.91 mg/dL (ref 0.50–1.10)
GFR, EST AFRICAN AMERICAN: 76 mL/min
GFR, EST NON AFRICAN AMERICAN: 65 mL/min
GLUCOSE: 73 mg/dL (ref 70–99)
Potassium: 4.1 mEq/L (ref 3.5–5.3)
SODIUM: 139 meq/L (ref 135–145)

## 2014-04-21 LAB — TSH: TSH: 3.066 u[IU]/mL (ref 0.350–4.500)

## 2014-04-21 LAB — LIPID PANEL
CHOLESTEROL: 121 mg/dL (ref 0–200)
HDL: 36 mg/dL — ABNORMAL LOW (ref >39)
LDL Cholesterol: 74 mg/dL (ref 0–99)
Total CHOL/HDL Ratio: 3.4 Ratio
Triglycerides: 56 mg/dL (ref <150)
VLDL: 11 mg/dL (ref 0–40)

## 2014-04-21 LAB — MAGNESIUM: Magnesium: 2 mg/dL (ref 1.5–2.5)

## 2014-04-21 LAB — HEPATIC FUNCTION PANEL
ALT: 21 U/L (ref 0–35)
AST: 24 U/L (ref 0–37)
Albumin: 3.8 g/dL (ref 3.5–5.2)
Alkaline Phosphatase: 51 U/L (ref 39–117)
BILIRUBIN DIRECT: 0.3 mg/dL (ref 0.0–0.3)
Indirect Bilirubin: 0.9 mg/dL (ref 0.2–1.2)
Total Bilirubin: 1.2 mg/dL (ref 0.2–1.2)
Total Protein: 6.5 g/dL (ref 6.0–8.3)

## 2014-04-21 LAB — INSULIN, FASTING: Insulin fasting, serum: 10 u[IU]/mL (ref 3–28)

## 2014-04-21 LAB — VITAMIN D 25 HYDROXY (VIT D DEFICIENCY, FRACTURES): VIT D 25 HYDROXY: 69 ng/mL (ref 30–89)

## 2014-04-21 LAB — IRON AND TIBC
%SAT: 34 % (ref 20–55)
Iron: 105 ug/dL (ref 42–145)
TIBC: 309 ug/dL (ref 250–470)
UIBC: 204 ug/dL (ref 125–400)

## 2014-04-21 LAB — CK: CK TOTAL: 159 U/L (ref 7–177)

## 2014-04-21 LAB — VITAMIN B12: Vitamin B-12: 298 pg/mL (ref 211–911)

## 2014-04-27 ENCOUNTER — Other Ambulatory Visit: Payer: Self-pay | Admitting: Internal Medicine

## 2014-05-21 ENCOUNTER — Ambulatory Visit (INDEPENDENT_AMBULATORY_CARE_PROVIDER_SITE_OTHER): Payer: Medicare Other | Admitting: Internal Medicine

## 2014-05-21 ENCOUNTER — Encounter: Payer: Self-pay | Admitting: Internal Medicine

## 2014-05-21 ENCOUNTER — Other Ambulatory Visit: Payer: Self-pay | Admitting: Emergency Medicine

## 2014-05-21 VITALS — BP 114/80 | HR 88 | Temp 98.8°F | Resp 16 | Ht 60.0 in | Wt 169.8 lb

## 2014-05-21 DIAGNOSIS — J042 Acute laryngotracheitis: Secondary | ICD-10-CM

## 2014-05-21 MED ORDER — HYDROCODONE-ACETAMINOPHEN 5-325 MG PO TABS: ORAL_TABLET | ORAL | Status: AC

## 2014-05-21 MED ORDER — PREDNISONE 20 MG PO TABS: ORAL_TABLET | ORAL | Status: DC

## 2014-05-21 MED ORDER — AZITHROMYCIN 250 MG PO TABS: ORAL_TABLET | ORAL | Status: AC

## 2014-05-21 NOTE — Progress Notes (Signed)
Subjective:    Patient ID: Elizabeth [name], female    DOB: 10-05-1946, 68 y.o.   MRN: [id number]  Cough This is a new problem. The current episode started in the past 7 days. The problem has been waxing and waning. The problem occurs every few minutes. The cough is productive of purulent sputum. Associated symptoms include chest pain, chills, ear congestion, ear pain, a fever, headaches and a sore throat. Pertinent negatives include no heartburn, hemoptysis, myalgias, postnasal drip, rash, rhinorrhea, shortness of breath, sweats, weight loss or wheezing. Associated symptoms comments: c/o intermittant hoarseness and laryngitis..   Medication List   ALPRAZolam 1 MG tablet  Commonly known as:  XANAX  TAKE 1/2 TO 1 TABLET BY MOUTH THREE TIMES DAILY AS NEEDED     aspirin 81 MG tablet  Take 81 mg by mouth daily.     azithromycin 250 MG tablet   NEW  Commonly known as:  ZITHROMAX  Take 2 tablets (500 mg) on  Day 1,  followed by 1 tablet (250 mg) once daily on Days 2 through 5.     EST ESTROGENS-METHYLTEST HS 0.625-1.25 MG per tablet  Generic drug:  estrogen-methylTESTOSTERone  TAKE 1 TABLET BY MOUTH ONCE DAILY     HYDROcodone-acetaminophen 5-325 MG per tablet   __NEW  Commonly known as:  NORCO  Take 1/2 to 1 tablet every 3 to 4 hours as needed for cough     levothyroxine 50 MCG tablet  Commonly known as:  SYNTHROID, LEVOTHROID  - Take 1 tablet 3x wk on MWF - And take 1 & 1/2 tab 4x wk on TThSS or as directed     LORazepam 2 MG tablet  Commonly known as:  ATIVAN  TAKE 1/2 TO 1 TABLET BY MOUTH AT BEDTIME AS NEEDED FOR SLEEP     meloxicam 15 MG tablet  Commonly known as:  MOBIC  1/2 to 1 tablet daily with food for pain and inflammation     montelukast 10 MG tablet  Commonly known as:  SINGULAIR  TAKE 1 TABLET BY MOUTH EVERY DAY     predniSONE 20 MG tablet   -- NEW  Commonly known as:  DELTASONE  1 tab 3 x day for 3 days, then 1 tab 2 x day for 3 days, then 1 tab 1 x day for 5  days     simvastatin 20 MG tablet  Commonly known as:  ZOCOR  TAKE 1 TABLET BY MOUTH EVERY NIGHT AT BEDTIME     triamterene-hydrochlorothiazide 75-50 MG per tablet  Commonly known as:  MAXZIDE  TAKE 1 TABLET BY MOUTH DAILY     Allergies  Allergen Reactions  . Penicillins Swelling and Rash   Past Medical History  Diagnosis Date  . Hypertension   . Hyperlipidemia   . Thyroid disease   . Prediabetes   . Allergy   . Anxiety   . Insomnia    Review of Systems  Constitutional: Positive for fever and chills. Negative for weight loss.  HENT: Positive for ear pain and sore throat. Negative for postnasal drip and rhinorrhea.   Respiratory: Positive for cough. Negative for hemoptysis, shortness of breath and wheezing.   Cardiovascular: Positive for chest pain.  Gastrointestinal: Negative for heartburn.  Musculoskeletal: Negative for myalgias.  Skin: Negative for rash.  Neurological: Positive for headaches.    BP 114/80  P 88  T 98.8 F  R 16  Ht 5'   Wt 169 lb 12.8 oz  BMI 33.16 kg/m2  Objective:   Physical Exam  Constitutional: She is oriented to person, place, and time.  In no Distress. Brassy coarse cough.  HENT:  Head: Normocephalic.  Right Ear: External ear normal.  Left Ear: External ear normal.  Nose: Nose normal.  Mouth/Throat: Oropharynx is clear and moist. No oropharyngeal exudate.  Eyes: EOM are normal. Pupils are equal, round, and reactive to light.  Neck: Normal range of motion. Neck supple. No JVD present. No thyromegaly present.  Cardiovascular: Normal rate, regular rhythm and normal heart sounds.   No murmur heard. Pulmonary/Chest: Effort normal. No respiratory distress. She has no wheezes. She has rales.  Abdominal: Soft.  Musculoskeletal: Normal range of motion.  Lymphadenopathy:    She has no cervical adenopathy.  Neurological: She is alert and oriented to person, place, and time. No cranial nerve deficit. Coordination normal.  Skin: Skin is  dry. No rash noted. No erythema. No pallor.   Assessment & Plan:   1. Acute laryngotracheitis  - predniSONE (DELTASONE) 20 MG tablet; 1 tab 3 x day for 3 days, then 1 tab 2 x day for 3 days, then 1 tab 1 x day for 5 days  Dispense: 20 tablet; Refill: 0 - azithromycin (ZITHROMAX) 250 MG tablet; Take 2 tablets (500 mg) on  Day 1,  followed by 1 tablet (250 mg) once daily on Days 2 through 5.  Dispense: 6 each; Refill: 1 - HYDROcodone-acetaminophen (NORCO) 5-325 MG per tablet; Take 1/2 to 1 tablet every 3 to 4 hours as needed for cough  Dispense: 50 tablet; Refill: 0

## 2014-05-21 NOTE — Patient Instructions (Signed)
   Mucus Relief 400 mg    2 tablets 3 x day after meals

## 2014-05-25 ENCOUNTER — Ambulatory Visit: Payer: Self-pay | Admitting: Internal Medicine

## 2014-05-31 ENCOUNTER — Other Ambulatory Visit: Payer: Self-pay | Admitting: Physician Assistant

## 2014-06-06 ENCOUNTER — Other Ambulatory Visit: Payer: Self-pay | Admitting: Physician Assistant

## 2014-06-15 ENCOUNTER — Other Ambulatory Visit: Payer: Self-pay | Admitting: Internal Medicine

## 2014-06-25 ENCOUNTER — Other Ambulatory Visit: Payer: Self-pay | Admitting: Emergency Medicine

## 2014-06-25 ENCOUNTER — Other Ambulatory Visit: Payer: Self-pay | Admitting: Internal Medicine

## 2014-06-26 ENCOUNTER — Encounter: Payer: Self-pay | Admitting: Internal Medicine

## 2014-06-27 ENCOUNTER — Other Ambulatory Visit: Payer: Self-pay | Admitting: Internal Medicine

## 2014-07-07 ENCOUNTER — Other Ambulatory Visit: Payer: Self-pay | Admitting: Physician Assistant

## 2014-07-30 ENCOUNTER — Encounter: Payer: Self-pay | Admitting: Emergency Medicine

## 2014-07-31 ENCOUNTER — Ambulatory Visit (INDEPENDENT_AMBULATORY_CARE_PROVIDER_SITE_OTHER): Payer: Medicare Other | Admitting: Emergency Medicine

## 2014-07-31 ENCOUNTER — Encounter: Payer: Self-pay | Admitting: Emergency Medicine

## 2014-07-31 VITALS — BP 134/70 | HR 70 | Temp 98.2°F | Resp 16 | Ht 60.25 in | Wt 170.0 lb

## 2014-07-31 DIAGNOSIS — E039 Hypothyroidism, unspecified: Secondary | ICD-10-CM

## 2014-07-31 DIAGNOSIS — I10 Essential (primary) hypertension: Secondary | ICD-10-CM

## 2014-07-31 DIAGNOSIS — E782 Mixed hyperlipidemia: Secondary | ICD-10-CM

## 2014-07-31 DIAGNOSIS — R739 Hyperglycemia, unspecified: Secondary | ICD-10-CM

## 2014-07-31 DIAGNOSIS — E559 Vitamin D deficiency, unspecified: Secondary | ICD-10-CM

## 2014-07-31 DIAGNOSIS — Z23 Encounter for immunization: Secondary | ICD-10-CM

## 2014-07-31 DIAGNOSIS — Z0000 Encounter for general adult medical examination without abnormal findings: Secondary | ICD-10-CM

## 2014-07-31 DIAGNOSIS — R9389 Abnormal findings on diagnostic imaging of other specified body structures: Secondary | ICD-10-CM

## 2014-07-31 DIAGNOSIS — Z1212 Encounter for screening for malignant neoplasm of rectum: Secondary | ICD-10-CM

## 2014-07-31 DIAGNOSIS — I519 Heart disease, unspecified: Secondary | ICD-10-CM

## 2014-07-31 LAB — CBC WITH DIFFERENTIAL/PLATELET
BASOS ABS: 0.1 10*3/uL (ref 0.0–0.1)
BASOS PCT: 1 % (ref 0–1)
EOS PCT: 2 % (ref 0–5)
Eosinophils Absolute: 0.1 10*3/uL (ref 0.0–0.7)
HEMATOCRIT: 42.7 % (ref 36.0–46.0)
Hemoglobin: 14.3 g/dL (ref 12.0–15.0)
Lymphocytes Relative: 28 % (ref 12–46)
Lymphs Abs: 2 10*3/uL (ref 0.7–4.0)
MCH: 28.9 pg (ref 26.0–34.0)
MCHC: 33.5 g/dL (ref 30.0–36.0)
MCV: 86.3 fL (ref 78.0–100.0)
Monocytes Absolute: 0.7 10*3/uL (ref 0.1–1.0)
Monocytes Relative: 10 % (ref 3–12)
NEUTROS ABS: 4.2 10*3/uL (ref 1.7–7.7)
Neutrophils Relative %: 59 % (ref 43–77)
Platelets: 240 10*3/uL (ref 150–400)
RBC: 4.95 MIL/uL (ref 3.87–5.11)
RDW: 13.3 % (ref 11.5–15.5)
WBC: 7.1 10*3/uL (ref 4.0–10.5)

## 2014-07-31 NOTE — Patient Instructions (Signed)

## 2014-07-31 NOTE — Progress Notes (Signed)
Subjective:    Patient ID: Elizabeth [name], female    DOB: 1946/06/25, 68 y.o.   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 Deficient. She is exercising 2-3 x weekly. She is eating healthy. She notes she has not been able to lose any weight.   She notes back pain resolved with blocked pore/ cyst removed from Thoracic spine area. She notes she is overall feeling well and without concerns.    WBC             6.6   04/20/2014 HGB            14.0   04/20/2014 HCT            41.7   04/20/2014 PLT             201   04/20/2014 GLUCOSE          73   04/20/2014 CHOL            121   04/20/2014 TRIG             56   04/20/2014 HDL              36   04/20/2014 LDLCALC          74   04/20/2014 ALT              21   04/20/2014 AST              24   04/20/2014 NA              139   04/20/2014 K               4.1   04/20/2014 CL              102   04/20/2014 CREATININE     0.91   04/20/2014 BUN              18   04/20/2014 CO2              30   04/20/2014 TSH           3.066   04/20/2014 HGBA1C          5.6   04/20/2014   Hyperlipidemia Pertinent negatives include no chest pain or shortness of breath.  Hypertension Pertinent negatives include no chest pain or shortness of breath.     Medication List       This list is accurate as of: 07/31/14 10:28 AM.  Always use your most recent med list.               ALPRAZolam 1 MG tablet  Commonly known as:  XANAX  TAKE 1 TABLET BY MOUTH THREE TIMES DAILY     aspirin 81 MG tablet  Take 81 mg by mouth daily.     EST ESTROGENS-METHYLTEST HS 0.625-1.25 MG per tablet  Generic drug:  estrogen-methylTESTOSTERone  TAKE 1 TABLET BY MOUTH EVERY DAY     levothyroxine 50 MCG tablet  Commonly known as:  SYNTHROID, LEVOTHROID  TAKE 1 TABLET BY MOUTH ONCE DAILY     meloxicam 15 MG tablet  Commonly known as:  MOBIC  1/2 to 1 tablet daily with food for pain and inflammation     montelukast 10 MG  tablet  Commonly known as:  SINGULAIR  TAKE 1 TABLET BY MOUTH EVERY DAY  simvastatin 20 MG tablet  Commonly known as:  ZOCOR  TAKE 1 TABLET BY MOUTH EVERY NIGHT AT BEDTIME     triamterene-hydrochlorothiazide 75-50 MG per tablet  Commonly known as:  MAXZIDE  TAKE 1 TABLET BY MOUTH DAILY       Allergies  Allergen Reactions  . Penicillins Swelling and Rash   Past Medical History  Diagnosis Date  . Hypertension   . Hyperlipidemia   . Thyroid disease   . Prediabetes   . Allergy   . Anxiety   . Insomnia    Past Surgical History  Procedure Laterality Date  . Tonsillectomy and adenoidectomy    . Abdominal hysterectomy  1995    w BSO   History  Substance Use Topics  . Smoking status: Former Smoker    Quit date: 10/23/1974  . Smokeless tobacco: Never Used  . Alcohol Use: No     Comment: Rare   Family History  Problem Relation Age of Onset  . Liver disease Mother   . ALS Father   . Hypertension Father     Preventative care:  Last colonoscopy: 11/19/12  Last mammogram: 08/14/13  Last pap smear/pelvic exam: 2011  DEXA:2013 WNL due 2018  CXR : 07/31/13  Prior vaccinations:  TD or Tdap:2007  Influenza: 10/ 2014  Pneumococcal: 2013  Shingles/Zostavax: 2008   Patient Care Team: [name], MD as PCP - General (Internal Medicine) Sharol Roussel, OD as Physician Assistant (Optometry) Jettie Booze, MD as Consulting Physician (Interventional Cardiology) Missy Sabins, MD as Consulting Physician (Gastroenterology) Simona Huh, MD as Consulting Physician (Dermatology) Clent Jacks, MD as Consulting Physician (Ophthalmology)   Review of Systems  Respiratory: Negative for shortness of breath.   Cardiovascular: Negative for chest pain.  All other systems reviewed and are negative.  BP 134/70  Pulse 70  Temp(Src) 98.2 F (36.8 C) (Temporal)  Resp 16  Ht 5' 0.25" (1.53 m)  Wt 170 lb (77.111 kg)  BMI 32.94 kg/m2     Objective:    Physical Exam  Nursing note and vitals reviewed. Constitutional: She is oriented to person, place, and time. She appears well-developed and well-nourished. No distress.  HENT:  Head: Normocephalic and atraumatic.  Right Ear: External ear normal.  Left Ear: External ear normal.  Nose: Nose normal.  Mouth/Throat: Oropharynx is clear and moist.  Eyes: Conjunctivae and EOM are normal. Pupils are equal, round, and reactive to light. Right eye exhibits no discharge. Left eye exhibits no discharge. No scleral icterus.  Neck: Normal range of motion. Neck supple. No JVD present. No tracheal deviation present. No thyromegaly present.  Cardiovascular: Normal rate, regular rhythm, normal heart sounds and intact distal pulses.   Pulmonary/Chest: Effort normal and breath sounds normal.  Abdominal: Soft. Bowel sounds are normal. She exhibits no distension and no mass. There is no tenderness. There is no rebound and no guarding.  Genitourinary:  GYN- Def Breasts- WNL  Musculoskeletal: Normal range of motion. She exhibits no edema and no tenderness.  Lymphadenopathy:    She has no cervical adenopathy.  Neurological: She is alert and oriented to person, place, and time. She has normal reflexes. No cranial nerve deficit. She exhibits normal muscle tone. Coordination normal.  Skin: Skin is warm and dry. No rash noted. No erythema. No pallor.  Psychiatric: She has a normal mood and affect. Her behavior is normal. Judgment and thought content normal.     AORTA SCAN questionable abnormal 4.8 EKG NSCSPT  Assessment & Plan:  1. CPE- Update screening labs/ History/ Immunizations/ Testing as needed. Advised healthy diet, QD exercise, increase H20 and continue RX/ Vitamins AD.   2. ? Aorta scan w/o obvious abdominal Bruit on exam- get Complete abdomen U/S  3. 3 month F/U for HTN, Cholesterol, Pre-Dm, D. Deficient. Needs healthy diet, cardio QD and obtain healthy weight. Check Labs, Check BP if >130/80  call office

## 2014-08-01 LAB — BASIC METABOLIC PANEL WITH GFR
BUN: 18 mg/dL (ref 6–23)
CALCIUM: 9.6 mg/dL (ref 8.4–10.5)
CO2: 28 meq/L (ref 19–32)
CREATININE: 1.07 mg/dL (ref 0.50–1.10)
Chloride: 98 mEq/L (ref 96–112)
GFR, Est African American: 62 mL/min
GFR, Est Non African American: 54 mL/min — ABNORMAL LOW
Glucose, Bld: 78 mg/dL (ref 70–99)
Potassium: 3.9 mEq/L (ref 3.5–5.3)
Sodium: 139 mEq/L (ref 135–145)

## 2014-08-01 LAB — INSULIN, FASTING: Insulin fasting, serum: 4.7 u[IU]/mL (ref 2.0–19.6)

## 2014-08-01 LAB — MAGNESIUM: Magnesium: 2.1 mg/dL (ref 1.5–2.5)

## 2014-08-01 LAB — MICROALBUMIN / CREATININE URINE RATIO
Creatinine, Urine: 92.9 mg/dL
MICROALB/CREAT RATIO: 4.3 mg/g (ref 0.0–30.0)
Microalb, Ur: 0.4 mg/dL (ref <2.0)

## 2014-08-01 LAB — URINALYSIS, ROUTINE W REFLEX MICROSCOPIC
BILIRUBIN URINE: NEGATIVE
Glucose, UA: NEGATIVE mg/dL
Hgb urine dipstick: NEGATIVE
KETONES UR: NEGATIVE mg/dL
Nitrite: NEGATIVE
PH: 7 (ref 5.0–8.0)
Protein, ur: NEGATIVE mg/dL
SPECIFIC GRAVITY, URINE: 1.011 (ref 1.005–1.030)
Urobilinogen, UA: 0.2 mg/dL (ref 0.0–1.0)

## 2014-08-01 LAB — LIPID PANEL
CHOLESTEROL: 133 mg/dL (ref 0–200)
HDL: 51 mg/dL (ref >39)
LDL Cholesterol: 71 mg/dL (ref 0–99)
TRIGLYCERIDES: 57 mg/dL (ref <150)
Total CHOL/HDL Ratio: 2.6 Ratio
VLDL: 11 mg/dL (ref 0–40)

## 2014-08-01 LAB — URINALYSIS, MICROSCOPIC ONLY
CRYSTALS: NONE SEEN
Casts: NONE SEEN

## 2014-08-01 LAB — HEPATIC FUNCTION PANEL
ALBUMIN: 4.4 g/dL (ref 3.5–5.2)
ALT: 27 U/L (ref 0–35)
AST: 25 U/L (ref 0–37)
Alkaline Phosphatase: 65 U/L (ref 39–117)
Bilirubin, Direct: 0.3 mg/dL (ref 0.0–0.3)
Indirect Bilirubin: 1 mg/dL (ref 0.2–1.2)
TOTAL PROTEIN: 7.4 g/dL (ref 6.0–8.3)
Total Bilirubin: 1.3 mg/dL — ABNORMAL HIGH (ref 0.2–1.2)

## 2014-08-01 LAB — VITAMIN D 25 HYDROXY (VIT D DEFICIENCY, FRACTURES): Vit D, 25-Hydroxy: 73 ng/mL (ref 30–89)

## 2014-08-01 LAB — HEMOGLOBIN A1C
Hgb A1c MFr Bld: 5.6 % (ref <5.7)
MEAN PLASMA GLUCOSE: 114 mg/dL (ref <117)

## 2014-08-01 LAB — TSH: TSH: 2.483 u[IU]/mL (ref 0.350–4.500)

## 2014-08-06 ENCOUNTER — Ambulatory Visit (INDEPENDENT_AMBULATORY_CARE_PROVIDER_SITE_OTHER): Payer: Medicare Other | Admitting: *Deleted

## 2014-08-06 DIAGNOSIS — N390 Urinary tract infection, site not specified: Secondary | ICD-10-CM

## 2014-08-06 NOTE — Progress Notes (Signed)
Patient ID: Elizabeth [name], female   DOB: [birth year], 68 y.o.   MRN: [id number] Patient presents for recheck UA, C&S with questionable UTI vs non-clean catch.  Patient denies any current UTI symptoms, but will call with results of U/C.

## 2014-08-07 LAB — URINALYSIS, ROUTINE W REFLEX MICROSCOPIC
BILIRUBIN URINE: NEGATIVE
Glucose, UA: NEGATIVE mg/dL
Hgb urine dipstick: NEGATIVE
KETONES UR: NEGATIVE mg/dL
Nitrite: NEGATIVE
Protein, ur: NEGATIVE mg/dL
Specific Gravity, Urine: 1.011 (ref 1.005–1.030)
UROBILINOGEN UA: 1 mg/dL (ref 0.0–1.0)
pH: 7 (ref 5.0–8.0)

## 2014-08-07 LAB — URINALYSIS, MICROSCOPIC ONLY
Bacteria, UA: NONE SEEN
CRYSTALS: NONE SEEN
Casts: NONE SEEN

## 2014-08-07 LAB — URINE CULTURE: Colony Count: 50000

## 2014-08-12 ENCOUNTER — Encounter: Payer: Self-pay | Admitting: Emergency Medicine

## 2014-08-14 ENCOUNTER — Other Ambulatory Visit: Payer: 59

## 2014-08-14 ENCOUNTER — Encounter: Payer: Self-pay | Admitting: Physician Assistant

## 2014-08-14 ENCOUNTER — Ambulatory Visit (INDEPENDENT_AMBULATORY_CARE_PROVIDER_SITE_OTHER): Payer: Medicare Other | Admitting: Physician Assistant

## 2014-08-14 VITALS — BP 120/80 | HR 72 | Temp 98.2°F | Resp 16 | Ht 60.25 in | Wt 171.0 lb

## 2014-08-14 DIAGNOSIS — J0100 Acute maxillary sinusitis, unspecified: Secondary | ICD-10-CM

## 2014-08-14 DIAGNOSIS — R0982 Postnasal drip: Secondary | ICD-10-CM

## 2014-08-14 MED ORDER — BENZONATATE 100 MG PO CAPS
100.0000 mg | ORAL_CAPSULE | Freq: Four times a day (QID) | ORAL | Status: DC | PRN
Start: 2014-08-14 — End: 2014-12-29

## 2014-08-14 MED ORDER — FLUTICASONE PROPIONATE 50 MCG/ACT NA SUSP: 1.0000 | Freq: Every day | NASAL | Status: DC

## 2014-08-14 NOTE — Patient Instructions (Signed)

## 2014-08-14 NOTE — Progress Notes (Signed)
Subjective:    Patient ID: Elizabeth [name], female    DOB: Apr 01, 1946, 68 y.o.   MRN: [id number]  68 y.o. female presents today due to cough. Cough This is a new problem. Episode onset: Started this week. The problem has been unchanged. The problem occurs constantly. The cough is non-productive. Associated symptoms include chills, headaches, nasal congestion, postnasal drip, rhinorrhea, a sore throat and shortness of breath. Pertinent negatives include no chest pain, ear congestion, ear pain, fever, rash, sweats or wheezing. Associated symptoms comments: Had chills but have gone away.  Can't take deep breathe sometimes.  The sore throat is now gone and just has a cough due what she thinks post nasal drip. Nothing aggravates the symptoms. Risk factors: No sick contacts. She has tried rest (Ibuprofen and started allegra Wednesday.) for the symptoms. The treatment provided no relief. Her past medical history is significant for environmental allergies.    Review of Systems  Constitutional: Positive for chills. Negative for fever and fatigue.  HENT: Positive for congestion, postnasal drip, rhinorrhea, sinus pressure and sore throat. Negative for dental problem, drooling, ear discharge, ear pain, facial swelling and sneezing.   Eyes: Negative.   Respiratory: Positive for cough and shortness of breath. Negative for wheezing.   Cardiovascular: Negative for chest pain.  Gastrointestinal: Negative.   Genitourinary: Negative.   Musculoskeletal: Negative.   Skin: Negative.  Negative for rash.  Allergic/Immunologic: Positive for environmental allergies.  Neurological: Positive for headaches. Negative for dizziness and light-headedness.   Past Medical History  Diagnosis Date  . Hypertension   . Hyperlipidemia   . Thyroid disease   . Prediabetes   . Allergy   . Anxiety   . Insomnia    Current Outpatient Prescriptions on File Prior to Visit  Medication Sig Dispense Refill  . ALPRAZolam (XANAX) 1  MG tablet TAKE 1 TABLET BY MOUTH THREE TIMES DAILY  90 tablet  5  . aspirin 81 MG tablet Take 81 mg by mouth daily.      Marland Kitchen EST ESTROGENS-METHYLTEST HS 0.625-1.25 MG per tablet TAKE 1 TABLET BY MOUTH EVERY DAY  90 tablet  3  . levothyroxine (SYNTHROID, LEVOTHROID) 50 MCG tablet TAKE 1 TABLET BY MOUTH ONCE DAILY  90 tablet  99  . meloxicam (MOBIC) 15 MG tablet 1/2 to 1 tablet daily with food for pain and inflammation  90 tablet  99  . montelukast (SINGULAIR) 10 MG tablet TAKE 1 TABLET BY MOUTH EVERY DAY  30 tablet  3  . simvastatin (ZOCOR) 20 MG tablet TAKE 1 TABLET BY MOUTH EVERY NIGHT AT BEDTIME  90 tablet  99  . triamterene-hydrochlorothiazide (MAXZIDE) 75-50 MG per tablet TAKE 1 TABLET BY MOUTH DAILY  90 tablet  0   No current facility-administered medications on file prior to visit.   Allergies  Allergen Reactions  . Penicillins Swelling and Rash   Immunization History  Administered Date(s) Administered  . Influenza, High Dose Seasonal PF 07/31/2014  . Pneumococcal Polysaccharide-23 07/22/2012  . Td 12/25/2005  . Zoster 01/09/2007      Objective:   Physical Exam  Constitutional: She is oriented to person, place, and time. She appears well-developed and well-nourished. No distress.  HENT:  Head: Normocephalic.  Right Ear: Tympanic membrane, external ear and ear canal normal. No drainage, swelling or tenderness. Tympanic membrane is not erythematous, not retracted and not bulging. No middle ear effusion.  Left Ear: Tympanic membrane, external ear and ear canal normal. No drainage, swelling or tenderness. Tympanic  membrane is not erythematous, not retracted and not bulging.  No middle ear effusion.  Nose: Mucosal edema, rhinorrhea and sinus tenderness present. Right sinus exhibits maxillary sinus tenderness. Right sinus exhibits no frontal sinus tenderness. Left sinus exhibits maxillary sinus tenderness. Left sinus exhibits no frontal sinus tenderness.  Mouth/Throat: Oropharynx is  clear and moist and mucous membranes are normal. No trismus in the jaw. No oropharyngeal exudate, posterior oropharyngeal edema, posterior oropharyngeal erythema or tonsillar abscesses.  Eyes: Conjunctivae, EOM and lids are normal. Pupils are equal, round, and reactive to light. Right eye exhibits no discharge. Left eye exhibits no discharge. No scleral icterus.  Neck: Trachea normal and normal range of motion. Neck supple. No tracheal deviation present.  Cardiovascular: Normal rate, regular rhythm, S1 normal, S2 normal, normal heart sounds and normal pulses.  Exam reveals no gallop, no distant heart sounds and no friction rub.   No murmur heard. Pulmonary/Chest: Effort normal and breath sounds normal. No stridor. No respiratory distress. She has no decreased breath sounds. She has no wheezes. She has no rhonchi. She has no rales. She exhibits no tenderness.  Abdominal: Soft. Normal appearance and bowel sounds are normal. She exhibits no distension and no mass. There is no tenderness. There is no rebound and no guarding.  Musculoskeletal: Normal range of motion.  Lymphadenopathy:       Head (right side): No submental, no submandibular, no tonsillar, no preauricular, no posterior auricular and no occipital adenopathy present.       Head (left side): No submental, no submandibular, no tonsillar, no preauricular, no posterior auricular and no occipital adenopathy present.    She has no cervical adenopathy.       Right: No supraclavicular adenopathy present.       Left: No supraclavicular adenopathy present.  Neurological: She is alert and oriented to person, place, and time.  Skin: Skin is warm, dry and intact. No rash noted. No cyanosis or erythema. No pallor.  Psychiatric: She has a normal mood and affect. Her speech is normal. Judgment normal.      Assessment & Plan:  1. Acute maxillary sinusitis, recurrence not specified - fluticasone (FLONASE) 50 MCG/ACT nasal spray; Place 1 spray into both  nostrils daily.  Dispense: 16 g; Refill: 2 - Try the Flonase or saline nasal spray to loosen up the sinuses.  Remember to spray each nostril and to pinch your nose and to do this at bedtime.  2. Post-nasal drip  fluticasone (FLONASE) 50 MCG/ACT nasal spray; Place 1 spray into both nostrils daily.  Dispense: 16 g; Refill: 2 - benzonatate (TESSALON PERLES) 100 MG capsule; Take 1 capsule (100 mg total) by mouth every 6 (six) hours as needed for cough.  Dispense: 60 capsule; Refill: 1 - Take Tessalon Perles for cough and post nasal drip.   - Take Tylenol for pain since already on Meloxicam.  - Continue Allegra - If not better in 5-10 days, then contact the office and I will send in antibiotic for you.  Akima Slaugh, Stephani Police, PA-C 2:11 PM [facility]

## 2014-08-16 ENCOUNTER — Other Ambulatory Visit: Payer: Self-pay | Admitting: Internal Medicine

## 2014-08-18 ENCOUNTER — Ambulatory Visit
Admission: RE | Admit: 2014-08-18 | Discharge: 2014-08-18 | Disposition: A | Discharge status: Home / Self Care | Payer: Medicare Other | Source: Ambulatory Visit | Attending: Emergency Medicine | Admitting: Emergency Medicine

## 2014-08-18 DIAGNOSIS — R9389 Abnormal findings on diagnostic imaging of other specified body structures: Secondary | ICD-10-CM

## 2014-08-27 ENCOUNTER — Other Ambulatory Visit: Payer: Self-pay | Admitting: Emergency Medicine

## 2014-08-27 DIAGNOSIS — R932 Abnormal findings on diagnostic imaging of liver and biliary tract: Secondary | ICD-10-CM

## 2014-09-21 ENCOUNTER — Other Ambulatory Visit (INDEPENDENT_AMBULATORY_CARE_PROVIDER_SITE_OTHER): Payer: Self-pay | Admitting: Surgery

## 2014-09-21 DIAGNOSIS — K824 Cholesterolosis of gallbladder: Secondary | ICD-10-CM

## 2014-10-07 ENCOUNTER — Other Ambulatory Visit: Payer: Self-pay | Admitting: Internal Medicine

## 2014-11-03 ENCOUNTER — Ambulatory Visit (INDEPENDENT_AMBULATORY_CARE_PROVIDER_SITE_OTHER): Payer: Medicare Other | Admitting: Internal Medicine

## 2014-11-03 ENCOUNTER — Encounter: Payer: Self-pay | Admitting: Internal Medicine

## 2014-11-03 VITALS — BP 112/70 | HR 76 | Temp 97.7°F | Resp 16 | Ht 60.25 in | Wt 171.2 lb

## 2014-11-03 DIAGNOSIS — R7303 Prediabetes: Secondary | ICD-10-CM

## 2014-11-03 DIAGNOSIS — Z0000 Encounter for general adult medical examination without abnormal findings: Secondary | ICD-10-CM

## 2014-11-03 DIAGNOSIS — I10 Essential (primary) hypertension: Secondary | ICD-10-CM

## 2014-11-03 DIAGNOSIS — E039 Hypothyroidism, unspecified: Secondary | ICD-10-CM

## 2014-11-03 DIAGNOSIS — R6889 Other general symptoms and signs: Secondary | ICD-10-CM

## 2014-11-03 DIAGNOSIS — Z79899 Other long term (current) drug therapy: Secondary | ICD-10-CM | POA: Insufficient documentation

## 2014-11-03 DIAGNOSIS — E782 Mixed hyperlipidemia: Secondary | ICD-10-CM

## 2014-11-03 DIAGNOSIS — E559 Vitamin D deficiency, unspecified: Secondary | ICD-10-CM

## 2014-11-03 DIAGNOSIS — Z0001 Encounter for general adult medical examination with abnormal findings: Secondary | ICD-10-CM

## 2014-11-03 DIAGNOSIS — R7309 Other abnormal glucose: Secondary | ICD-10-CM

## 2014-11-03 LAB — HEMOGLOBIN A1C
HEMOGLOBIN A1C: 5.8 % — AB (ref <5.7)
Mean Plasma Glucose: 120 mg/dL — ABNORMAL HIGH (ref <117)

## 2014-11-03 NOTE — Progress Notes (Signed)
Patient ID: Elizabeth [name], female   DOB: 1946-07-10, 69 y.o.   MRN: [id number]  MEDICARE ANNUAL WELLNESS VISIT AND OV  Assessment:   1. Essential hypertension  - TSH  2. Hyperlipidemia  - Lipid panel  3. Prediabetes  - Hemoglobin A1c - Insulin, fasting  4. Hypothyroidism, unspecified hypothyroidism type  - Vit D  25 hydroxy (rtn osteoporosis monitoring)  5. Vitamin D deficiency   6. Medication management  - CBC with Differential - BASIC METABOLIC PANEL WITH GFR - Hepatic function panel - Magnesium  7. Routine general medical examination at a health care facility   Plan:   During the course of the visit the patient was educated and counseled about appropriate screening and preventive services including:    Pneumococcal vaccine   Influenza vaccine  Td vaccine  Screening electrocardiogram  Bone densitometry screening  Colorectal cancer screening  Diabetes screening  Glaucoma screening  Nutrition counseling   Advanced directives: requested  Screening recommendations, referrals: Vaccinations: DT vaccine 12/25/2005 Influenza vaccine HD 07/31/2014 Pneumococcal vaccine 07/22/2012 Prevnar vaccine unavailable Shingles vaccine 01/09/2007 Hep B vaccine not indicated  Nutrition assessed and recommended  Colonoscopy jan 2014 - recc 10 yr f/u Recommended yearly ophthalmology/optometry visit for glaucoma screening and checkup Recommended yearly dental visit for hygiene and checkup Advanced directives - yes  Conditions/risks identified: BMI: Discussed weight loss, diet, and increase physical activity.  Increase physical activity: AHA recommends 150 minutes of physical activity a week.  Medications reviewed PreDiabetes is at goal, ACE/ARB therapy: Not indicated Urinary Incontinence is not an issue: discussed non pharmacology and pharmacology options.  Fall risk: low- discussed PT, home fall assessment, medications.   Subjective:   Elizabeth [name] is a 69  y.o. female who presents for Medicare Annual Wellness Visit and complete physical.  Date of last medicare wellness visit is unknown.  She has had elevated blood pressure since 1997. Her blood pressure has been controlled at home, & today their BP is BP: 112/70 mmHg She does workout. She denies chest pain, shortness of breath, dizziness.  She is on cholesterol medication and denies myalgias. Her cholesterol is at goal. The cholesterol last visit was:  Lab Results  Component Value Date   CHOL 133 07/31/2014   HDL 51 07/31/2014   LDLCALC 71 07/31/2014   TRIG 57 07/31/2014   CHOLHDL 2.6 07/31/2014   She has had prediabetes for 3 years (A1c 5.7% in 2012). She has been working on diet and exercise for prediabetes, and denies foot ulcerations, hyperglycemia, hypoglycemia , paresthesia of the feet, polydipsia, polyuria and visual disturbances. Last A1C in the office was:  Lab Results  Component Value Date   HGBA1C 5.6 07/31/2014   Patient is on Vitamin D supplement.   Lab Results  Component Value Date   VD25OH 73 07/31/2014     Names of Other Physician/Practitioners you currently use: 1. [facility] here for primary care 2. Dr Katy Fitch , eye doctor, last visit Dec 2014 3. Dr Dalene Carrow, DDS, dentist, last visit Sept 2015  Patient Care Team: [name], MD as PCP - General (Internal Medicine) Sharol Roussel, OD as Physician Assistant (Optometry) Jettie Booze, MD as Consulting Physician (Interventional Cardiology) Missy Sabins, MD as Consulting Physician (Gastroenterology) Simona Huh, MD as Consulting Physician (Dermatology) Clent Jacks, MD as Consulting Physician (Ophthalmology)  Medication Review: Medication Sig  . ALPRAZolam (XANAX) 1 MG tablet TAKE 1 TABLET BY MOUTH THREE TIMES DAILY  . aspirin  81 MG tablet Take 81 mg by mouth daily.  . benzonatate (TESSALON PERLES) 100 MG capsule Take 1 capsule (100 mg total) by  mouth every 6 (six) hours as needed for cough.  . EST ESTROGENS-METHYLTEST HS 0.625-1.25 MG per tablet TAKE 1 TABLET BY MOUTH EVERY DAY  . fluticasone (FLONASE) 50 MCG/ACT nasal spray Place 1 spray into both nostrils daily.  Marland Kitchen levothyroxine (SYNTHROID, LEVOTHROID) 50 MCG tablet TAKE 1 TABLET BY MOUTH ONCE DAILY  . meloxicam (MOBIC) 15 MG tablet 1/2 to 1 tablet daily with food for pain and inflammation  . montelukast (SINGULAIR) 10 MG tablet TAKE 1 TABLET BY MOUTH DAILY  . simvastatin (ZOCOR) 20 MG tablet TAKE 1 TABLET BY MOUTH EVERY NIGHT AT BEDTIME  . triamterene-hydrochlorothiazide (MAXZIDE) 75-50 MG per tablet TAKE 1 TABLET BY MOUTH DAILY   Current Problems (verified) Patient Active Problem List   Diagnosis Date Noted  . Medication management 11/03/2014  . Vitamin D deficiency 10/21/2013  . Hypothyroidism 10/21/2013  . Essential hypertension 10/21/2013  . Hyperlipidemia 10/21/2013  . Prediabetes   . Anxiety   . Insomnia    Screening Tests Health Maintenance  Topic Date Due  . INFLUENZA VACCINE  05/24/2015  . MAMMOGRAM  08/15/2015  . TETANUS/TDAP  12/26/2015  . COLONOSCOPY  11/19/2022  . DEXA SCAN  Completed  . PNEUMOCOCCAL POLYSACCHARIDE VACCINE AGE 69 AND OVER  Completed  . ZOSTAVAX  Completed    Immunization History  Administered Date(s) Administered  . Influenza, High Dose Seasonal PF 07/31/2014  . Pneumococcal Polysaccharide-23 07/22/2012  . Td 12/25/2005  . Zoster 01/09/2007   Preventative care: Last colonoscopy: Jan 2014 -recc 10 yr f/u  Prior vaccinations: TD : 12/25/2005  Influenza: HD 07/31/2014  Pneumococcal: 9/30 2013 Prevnar: unavailable Shingles/Zostavax: 01/09/2007  History reviewed: allergies, current medications, past family history, past medical history, past social history, past surgical history and problem list  Risk Factors: Tobacco History  Substance Use Topics  . Smoking status: Former Smoker    Quit date: 10/23/1974  . Smokeless tobacco:  Never Used  . Alcohol Use: No     Comment: Rare   She does not smoke.  Patient is not a former smoker. Are there smokers in your home (other than you)?  No  Alcohol Current alcohol use: none  Caffeine Current caffeine use: tea 4 glasses /day  Exercise Current exercise: housecleaning, walking and yard work  Nutrition/Diet Current diet: in general, a "healthy" diet    Cardiac risk factors: advanced age (older than 67 for men, 16 for women), dyslipidemia, hypertension and sedentary lifestyle.  Depression Screen (Note: if answer to either of the following is "Yes", a more complete depression screening is indicated)   Q1: Over the past two weeks, have you felt down, depressed or hopeless? No  Q2: Over the past two weeks, have you felt little interest or pleasure in doing things? No  Have you lost interest or pleasure in daily life? No  Do you often feel hopeless? No  Do you cry easily over simple problems? No  Activities of Daily Living In your present state of health, do you have any difficulty performing the following activities?:  Driving? No Managing money?  No Feeding yourself? No Getting from bed to chair? No Climbing a flight of stairs? No Preparing food and eating?: No Bathing or showering? No Getting dressed: No Getting to the toilet? No Using the toilet:No Moving around from place to place: No In the past year have you fallen or  had a near fall?:No   Are you sexually active?  No  Do you have more than one partner?  No  Vision Difficulties: No  Hearing Difficulties: No Do you often ask people to speak up or repeat themselves? No Do you experience ringing or noises in your ears? No Do you have difficulty understanding soft or whispered voices? Sometimes.  Cognition  Do you feel that you have a problem with memory?No  Do you often misplace items? No  Do you feel safe at home?  Yes  Advanced directives Does patient have a [place]?  Yes Does patient have a Living Will? Yes  In addition to the HPI above,  No Fever-chills,  No Headache, No changes with Vision or hearing,  No problems swallowing food or Liquids,  No Chest pain or productive Cough or Shortness of Breath,  No Abdominal pain, No Nausea or Vomitting, Bowel movements are regular,  No Blood in stool or Urine,  No dysuria,  No new skin rashes or bruises,  No new joints pains-aches,  No new weakness, tingling, numbness in any extremity,  No recent weight loss,  No polyuria, polydypsia or polyphagia,  No significant Mental Stressors.  A full 10 point Review of Systems was done, except as stated above, all other Review of Systems were negative  Objective:     Blood pressure 112/70, pulse 76, temperature 97.7 F (36.5 C), resp. rate 16, height 5' 0.25" (1.53 m), weight 171 lb 3.2 oz (77.656 kg). Body mass index is 33.17 kg/(m^2).  General appearance: alert, no distress, WD/WN, female Cognitive Testing  Alert? Yes  Normal Appearance?Yes  Oriented to person? Yes  Place? Yes   Time? Yes  Recall of three objects?  Yes  Can perform simple calculations? Yes  Displays appropriate judgment? Yes  Can read the correct time from a watch/clock?Yes  HEENT: normocephalic, sclerae anicteric, TMs pearly, nares patent, no discharge or erythema, pharynx normal Oral cavity: MMM, no lesions Neck: supple, no lymphadenopathy, no thyromegaly, no masses Heart: RRR, normal S1, S2, no murmurs Lungs: CTA bilaterally, no wheezes, rhonchi, or rales Abdomen: +bs, soft, non tender, non distended, no masses, no hepatomegaly, no splenomegaly Musculoskeletal: nontender, no swelling, no obvious deformity Extremities: no edema, no cyanosis, no clubbing Pulses: 2+ symmetric, upper and lower extremities, normal cap refill Neurological: alert, oriented x 3, CN2-12 intact, strength normal upper extremities and lower extremities, sensation normal throughout, DTRs 2+ throughout, no  cerebellar signs, gait normal Psychiatric: normal affect, behavior normal, pleasant   Medicare Attestation I have personally reviewed: The patient's medical and social history Their use of alcohol, tobacco or illicit drugs Their current medications and supplements The patient's functional ability including ADLs,fall risks, home safety risks, cognitive, and hearing and visual impairment Diet and physical activities Evidence for depression or mood disorders  The patient's weight, height, BMI, and visual acuity have been recorded in the chart.  I have made referrals, counseling, and provided education to the patient based on review of the above and I have provided the patient with a written personalized care plan for preventive services.    Feleica Fulmore DAVID, MD   11/03/2014

## 2014-11-03 NOTE — Patient Instructions (Signed)

## 2014-11-04 LAB — HEPATIC FUNCTION PANEL
ALBUMIN: 4.2 g/dL (ref 3.5–5.2)
ALT: 23 U/L (ref 0–35)
AST: 24 U/L (ref 0–37)
Alkaline Phosphatase: 61 U/L (ref 39–117)
BILIRUBIN DIRECT: 0.3 mg/dL (ref 0.0–0.3)
Indirect Bilirubin: 1 mg/dL (ref 0.2–1.2)
Total Bilirubin: 1.3 mg/dL — ABNORMAL HIGH (ref 0.2–1.2)
Total Protein: 7.1 g/dL (ref 6.0–8.3)

## 2014-11-04 LAB — BASIC METABOLIC PANEL WITH GFR
BUN: 16 mg/dL (ref 6–23)
CHLORIDE: 99 meq/L (ref 96–112)
CO2: 28 meq/L (ref 19–32)
Calcium: 9 mg/dL (ref 8.4–10.5)
Creat: 1.06 mg/dL (ref 0.50–1.10)
GFR, Est African American: 63 mL/min
GFR, Est Non African American: 54 mL/min — ABNORMAL LOW
Glucose, Bld: 80 mg/dL (ref 70–99)
POTASSIUM: 4 meq/L (ref 3.5–5.3)
SODIUM: 138 meq/L (ref 135–145)

## 2014-11-04 LAB — CBC WITH DIFFERENTIAL/PLATELET
Basophils Absolute: 0 10*3/uL (ref 0.0–0.1)
Basophils Relative: 0 % (ref 0–1)
EOS ABS: 0.2 10*3/uL (ref 0.0–0.7)
EOS PCT: 2 % (ref 0–5)
HEMATOCRIT: 45.5 % (ref 36.0–46.0)
Hemoglobin: 14.7 g/dL (ref 12.0–15.0)
Lymphocytes Relative: 23 % (ref 12–46)
Lymphs Abs: 2.1 10*3/uL (ref 0.7–4.0)
MCH: 28.9 pg (ref 26.0–34.0)
MCHC: 32.3 g/dL (ref 30.0–36.0)
MCV: 89.4 fL (ref 78.0–100.0)
MONOS PCT: 7 % (ref 3–12)
MPV: 11.8 fL (ref 8.6–12.4)
Monocytes Absolute: 0.6 10*3/uL (ref 0.1–1.0)
NEUTROS PCT: 68 % (ref 43–77)
Neutro Abs: 6.2 10*3/uL (ref 1.7–7.7)
Platelets: 242 10*3/uL (ref 150–400)
RBC: 5.09 MIL/uL (ref 3.87–5.11)
RDW: 13.2 % (ref 11.5–15.5)
WBC: 9.1 10*3/uL (ref 4.0–10.5)

## 2014-11-04 LAB — MAGNESIUM: MAGNESIUM: 2.1 mg/dL (ref 1.5–2.5)

## 2014-11-04 LAB — LIPID PANEL
CHOL/HDL RATIO: 3.5 ratio
CHOLESTEROL: 138 mg/dL (ref 0–200)
HDL: 40 mg/dL (ref >39)
LDL Cholesterol: 84 mg/dL (ref 0–99)
TRIGLYCERIDES: 70 mg/dL (ref <150)
VLDL: 14 mg/dL (ref 0–40)

## 2014-11-04 LAB — INSULIN, FASTING: Insulin fasting, serum: 24.6 u[IU]/mL — ABNORMAL HIGH (ref 2.0–19.6)

## 2014-11-04 LAB — VITAMIN D 25 HYDROXY (VIT D DEFICIENCY, FRACTURES): VIT D 25 HYDROXY: 34 ng/mL (ref 30–100)

## 2014-11-04 LAB — TSH: TSH: 3.876 u[IU]/mL (ref 0.350–4.500)

## 2014-11-12 ENCOUNTER — Other Ambulatory Visit: Payer: Self-pay | Admitting: Internal Medicine

## 2014-11-16 ENCOUNTER — Other Ambulatory Visit: Payer: Self-pay | Admitting: Internal Medicine

## 2014-12-07 ENCOUNTER — Other Ambulatory Visit: Payer: Self-pay | Admitting: Internal Medicine

## 2014-12-07 DIAGNOSIS — F411 Generalized anxiety disorder: Secondary | ICD-10-CM

## 2014-12-29 ENCOUNTER — Encounter: Payer: Self-pay | Admitting: Internal Medicine

## 2014-12-29 ENCOUNTER — Other Ambulatory Visit: Payer: Self-pay | Admitting: Internal Medicine

## 2014-12-29 ENCOUNTER — Ambulatory Visit: Payer: Self-pay | Admitting: Internal Medicine

## 2014-12-29 ENCOUNTER — Ambulatory Visit (INDEPENDENT_AMBULATORY_CARE_PROVIDER_SITE_OTHER): Payer: Medicare Other | Admitting: Internal Medicine

## 2014-12-29 VITALS — BP 142/86 | HR 74 | Temp 98.0°F | Resp 18 | Ht 60.25 in | Wt 172.0 lb

## 2014-12-29 DIAGNOSIS — M25562 Pain in left knee: Secondary | ICD-10-CM

## 2014-12-29 MED ORDER — PREDNISONE 20 MG PO TABS: 10.0000 mg | ORAL_TABLET | Freq: Every day | ORAL | Status: DC

## 2014-12-29 MED ORDER — TRAMADOL HCL 50 MG PO TABS: 50.0000 mg | ORAL_TABLET | Freq: Four times a day (QID) | ORAL | Status: DC | PRN

## 2014-12-29 NOTE — Progress Notes (Signed)
   Subjective:    Patient ID: Elizabeth [name], female    DOB: 11/28/1945, 69 y.o.   MRN: [id number]  HPI  Patient here complaing of left knee pain that has been going on for some time but it has gotten significantly worse for the last 3-4 days.  The pain is intermittent and aching and with radiation up and down the left leg.  There is no significant injury that she can think of recently.  She has been limping and has been favoring her right leg instead.  It is aggravated significantly when she tried to do lateral leg lifts in her silver sneaker classes.  Pain is a 3/10 pain right now.  The pain this weekend was 8/10.  She tried ice and heat at home with minimal relief.  She took ibuprofen in addition to her meloxicam.  No hx of surgery.    Review of Systems  Gastrointestinal: Negative for nausea and vomiting.  Musculoskeletal: Negative for myalgias.  Neurological: Negative for focal weakness.  Constitutional: Negative for fever, chills and fatigue.  Musculoskeletal: Positive for joint swelling, arthralgias and gait problem.       Objective:  Physical Exam  Constitutional: She is oriented to person, place, and time. She appears well-developed and well-nourished. No distress.  HENT:  Head: Normocephalic and atraumatic.  Nose: Nose normal.  Mouth/Throat: Oropharynx is clear and moist. No oropharyngeal exudate.  Eyes: Conjunctivae are normal. No scleral icterus.  Neck: Normal range of motion. Neck supple.  Cardiovascular: Normal rate, regular rhythm, normal heart sounds and intact distal pulses.  Exam reveals no gallop and no friction rub.   No murmur heard. Negative holmans sign bilaterally.  No evidence of calf swelling or calf tenderness bilaterally.  Pulmonary/Chest: Effort normal and breath sounds normal. No respiratory distress. She has no wheezes. She has no rales. She exhibits no tenderness.  Abdominal: Soft. Bowel sounds are normal.  Musculoskeletal:  Left knee is mildly swollen  without erythema or signficant effusion.  Normal active ROM.  Crepitus with passive ROM.  Tenderness to palpation of the medial and lateral joint line.  Negative anterior, posterior drawer, valgus and varus stress.  Negative McMurrays exam.  Positive patellar grind test.    Lymphadenopathy:    She has no cervical adenopathy.  Neurological: She is alert and oriented to person, place, and time.  Skin: Skin is warm and dry. She is not diaphoretic.  Psychiatric: She has a normal mood and affect. Her behavior is normal. Judgment and thought content normal.  Nursing note and vitals reviewed.     Assessment & Plan:    1. 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  Will d/c mobic and complete 6 day high does steroid taper and then restart mobic.  If no better after that will refer to ortho.  Will give tramadol as needed for severe pain.  Patient to call back in 2 weeks if no better.    - predniSONE (DELTASONE) 20 MG tablet; Take 0.5 tablets (10 mg total) by mouth daily with breakfast. Day 1 take 6 pills,Day 2 take 5 pills,Day 3 take 4 pills,Day 4 take 3 pills,Day 5 take 2 pills,Day 6 take 1 pill  Dispense: 21 tablet; Refill: 0 - traMADol (ULTRAM) 50 MG tablet; Take 1 tablet (50 mg total) by mouth every 6 (six) hours as needed.  Dispense: 30 tablet; Refill: 0

## 2014-12-29 NOTE — Patient Instructions (Signed)

## 2015-01-18 ENCOUNTER — Other Ambulatory Visit: Payer: Self-pay | Admitting: Internal Medicine

## 2015-01-18 ENCOUNTER — Encounter: Payer: Self-pay | Admitting: Internal Medicine

## 2015-01-18 DIAGNOSIS — M25562 Pain in left knee: Secondary | ICD-10-CM

## 2015-02-07 ENCOUNTER — Other Ambulatory Visit: Payer: Self-pay | Admitting: Internal Medicine

## 2015-02-07 DIAGNOSIS — I10 Essential (primary) hypertension: Secondary | ICD-10-CM

## 2015-02-07 DIAGNOSIS — R609 Edema, unspecified: Secondary | ICD-10-CM

## 2015-02-11 ENCOUNTER — Other Ambulatory Visit: Payer: Self-pay | Admitting: Physician Assistant

## 2015-02-23 ENCOUNTER — Ambulatory Visit
Admission: RE | Admit: 2015-02-23 | Discharge: 2015-02-23 | Disposition: A | Discharge status: Home / Self Care | Payer: Self-pay | Source: Ambulatory Visit | Attending: Surgery | Admitting: Surgery

## 2015-02-23 DIAGNOSIS — K824 Cholesterolosis of gallbladder: Secondary | ICD-10-CM

## 2015-02-25 ENCOUNTER — Ambulatory Visit (INDEPENDENT_AMBULATORY_CARE_PROVIDER_SITE_OTHER): Payer: Medicare Other | Admitting: Physician Assistant

## 2015-02-25 ENCOUNTER — Encounter: Payer: Self-pay | Admitting: Physician Assistant

## 2015-02-25 VITALS — BP 132/80 | HR 80 | Temp 97.8°F | Resp 16 | Ht 60.25 in | Wt 174.0 lb

## 2015-02-25 DIAGNOSIS — I10 Essential (primary) hypertension: Secondary | ICD-10-CM

## 2015-02-25 DIAGNOSIS — E559 Vitamin D deficiency, unspecified: Secondary | ICD-10-CM

## 2015-02-25 DIAGNOSIS — E039 Hypothyroidism, unspecified: Secondary | ICD-10-CM

## 2015-02-25 DIAGNOSIS — R7303 Prediabetes: Secondary | ICD-10-CM

## 2015-02-25 DIAGNOSIS — Z6829 Body mass index (BMI) 29.0-29.9, adult: Secondary | ICD-10-CM | POA: Insufficient documentation

## 2015-02-25 DIAGNOSIS — K769 Liver disease, unspecified: Secondary | ICD-10-CM

## 2015-02-25 DIAGNOSIS — E669 Obesity, unspecified: Secondary | ICD-10-CM

## 2015-02-25 DIAGNOSIS — R7309 Other abnormal glucose: Secondary | ICD-10-CM

## 2015-02-25 DIAGNOSIS — E782 Mixed hyperlipidemia: Secondary | ICD-10-CM

## 2015-02-25 DIAGNOSIS — Z79899 Other long term (current) drug therapy: Secondary | ICD-10-CM

## 2015-02-25 LAB — CBC WITH DIFFERENTIAL/PLATELET
BASOS PCT: 1 % (ref 0–1)
Basophils Absolute: 0.1 10*3/uL (ref 0.0–0.1)
EOS ABS: 0.2 10*3/uL (ref 0.0–0.7)
Eosinophils Relative: 3 % (ref 0–5)
HEMATOCRIT: 41.7 % (ref 36.0–46.0)
Hemoglobin: 14.1 g/dL (ref 12.0–15.0)
Lymphocytes Relative: 22 % (ref 12–46)
Lymphs Abs: 1.8 10*3/uL (ref 0.7–4.0)
MCH: 29.3 pg (ref 26.0–34.0)
MCHC: 33.8 g/dL (ref 30.0–36.0)
MCV: 86.7 fL (ref 78.0–100.0)
MPV: 11.8 fL (ref 8.6–12.4)
Monocytes Absolute: 0.6 10*3/uL (ref 0.1–1.0)
Monocytes Relative: 7 % (ref 3–12)
Neutro Abs: 5.5 10*3/uL (ref 1.7–7.7)
Neutrophils Relative %: 67 % (ref 43–77)
PLATELETS: 203 10*3/uL (ref 150–400)
RBC: 4.81 MIL/uL (ref 3.87–5.11)
RDW: 13.6 % (ref 11.5–15.5)
WBC: 8.2 10*3/uL (ref 4.0–10.5)

## 2015-02-25 NOTE — Progress Notes (Signed)
. Assessment and Plan:  1. Hypertension -Continue medication, monitor blood pressure at home. Continue DASH diet.  Reminder to go to the ER if any CP, SOB, nausea, dizziness, severe HA, changes vision/speech, left arm numbness and tingling and jaw pain.  2. Cholesterol -Continue diet and exercise. Check cholesterol.   3. Prediabetes  -Continue diet and exercise. Check A1C  4. Vitamin D Def - check level and continue medications.   5. Hypothyroidism- check TSH level, continue medications the same, reminded to take on an empty stomach 30-62mns before food.   6. Obesity with co morbidities-  long discussion about weight loss, diet, and exercise  7. Liver lesions on UKoreaincreasing in size Will get MRI hepatic protocol to better qualify the lesions No weight loss, night sweats, no family history of cancer.  Will start to taper estrogen, start on every other day x 1 months and then go to 1 pill 2 days a week x 1 month, and then stop.      Continue diet and meds as discussed. Further disposition pending results of labs. Over 30 minutes of exam, counseling, chart review, and critical decision making was performed  HPI 69y.o. female  presents for 3 month follow up on hypertension, cholesterol, prediabetes, and vitamin D deficiency.   Her blood pressure has been controlled at home, today their BP is BP: 132/80 mmHg  She does workout, has not started back with her left knee pain. She denies chest pain, shortness of breath, dizziness.  She is on cholesterol medication and denies myalgias. Her cholesterol is at goal. The cholesterol last visit was:   Lab Results  Component Value Date   CHOL 138 11/03/2014   HDL 40 11/03/2014   LDLCALC 84 11/03/2014   TRIG 70 11/03/2014   CHOLHDL 3.5 11/03/2014   She has been working on diet and exercise for prediabetes, and denies paresthesia of the feet, polydipsia, polyuria and visual disturbances. Last A1C in the office was:  Lab Results  Component  Value Date   HGBA1C 5.8* 11/03/2014  Patient is on Vitamin D supplement.   Lab Results  Component Value Date   VD25OH 34 11/03/2014   She is on thyroid medication. Her medication was not changed last visit.   Lab Results  Component Value Date   TSH 3.876 11/03/2014  She had repeat AB UKore[place]ith Dr. CBrantley Stagefor gallbladder polyp, this is unchanged however she does have hyperechoic foci within the liver and the right lobe lesion has increased. It is suggested hepatic MRI.  Had left knee pain, went to ortho, had normal xrays, possible lateral meniscal tear with popping/clicking worse with twisting but she had steroid injection with rest and it has been better.  BMI is Body mass index is 33.72 kg/(m^2)., she is working on diet and exercise. Wt Readings from Last 3 Encounters:  02/25/15 174 lb (78.926 kg)  12/29/14 172 lb (78.019 kg)  11/03/14 171 lb 3.2 oz (77.656 kg)    Current Medications:  Current Outpatient Prescriptions on File Prior to Visit  Medication Sig Dispense Refill  . ALPRAZolam (XANAX) 1 MG tablet TAKE 1 TABLET BY MOUTH THREE TIMES DAILY 90 tablet 5  . aspirin 81 MG tablet Take 81 mg by mouth daily.    .Marland KitchenEST ESTROGENS-METHYLTEST HS 0.625-1.25 MG per tablet TAKE 1 TABLET BY MOUTH EVERY DAY 90 tablet 3  . levothyroxine (SYNTHROID, LEVOTHROID) 50 MCG tablet TAKE 1 TABLET BY MOUTH ONCE DAILY 90 tablet 99  . meloxicam (  MOBIC) 15 MG tablet TAKE 1/2 TO 1 TABLET BY MOUTH DAILY WITH FOOD FOR PAIN AND INFLAMMATION 90 tablet 0  . montelukast (SINGULAIR) 10 MG tablet TAKE 1 TABLET BY MOUTH EVERY DAY 30 tablet 3  . simvastatin (ZOCOR) 20 MG tablet TAKE 1 TABLET BY MOUTH EVERY NIGHT AT BEDTIME 90 tablet 99  . triamterene-hydrochlorothiazide (MAXZIDE) 75-50 MG per tablet TAKE 1 TABLET BY MOUTH EVERY DAY 90 tablet 1   No current facility-administered medications on file prior to visit.   Medical History:  Past Medical History  Diagnosis Date  . Hypertension   . Hyperlipidemia   .  Thyroid disease   . Prediabetes   . Allergy   . Anxiety   . Insomnia    Allergies:  Allergies  Allergen Reactions  . Penicillins Swelling and Rash     Review of Systems:  Review of Systems  Constitutional: Negative.   HENT: Negative.   Eyes: Negative.   Respiratory: Negative.   Cardiovascular: Negative.   Gastrointestinal: Negative.   Genitourinary: Negative.   Musculoskeletal: Positive for joint pain. Negative for myalgias, back pain, falls and neck pain.  Skin: Negative.   Neurological: Negative.   Endo/Heme/Allergies: Negative.   Psychiatric/Behavioral: Negative.     Family history- Review and unchanged Social history- Review and unchanged Physical Exam: BP 132/80 mmHg  Pulse 80  Temp(Src) 97.8 F (36.6 C)  Resp 16  Ht 5' 0.25" (1.53 m)  Wt 174 lb (78.926 kg)  BMI 33.72 kg/m2 Wt Readings from Last 3 Encounters:  02/25/15 174 lb (78.926 kg)  12/29/14 172 lb (78.019 kg)  11/03/14 171 lb 3.2 oz (77.656 kg)   General Appearance: Well nourished, in no apparent distress. Eyes: PERRLA, EOMs, conjunctiva no swelling or erythema Sinuses: No Frontal/maxillary tenderness ENT/Mouth: Ext aud canals clear, TMs without erythema, bulging. No erythema, swelling, or exudate on post pharynx.  Tonsils not swollen or erythematous. Hearing normal.  Neck: Supple, thyroid normal.  Respiratory: Respiratory effort normal, BS equal bilaterally without rales, rhonchi, wheezing or stridor.  Cardio: RRR with no MRGs. Brisk peripheral pulses without edema.  Abdomen: Soft, + BS, obese,  Non tender, no guarding, rebound, hernias, masses. Lymphatics: Non tender without lymphadenopathy.  Musculoskeletal: Full ROM, 5/5 strength, Normal gait Skin: Warm, dry without rashes, lesions, ecchymosis.  Neuro: Cranial nerves intact. Normal muscle tone, no cerebellar symptoms. Psych: Awake and oriented X 3, normal affect, Insight and Judgment appropriate.    Vicie Mutters, PA-C 11:15 AM [facility]

## 2015-02-25 NOTE — Patient Instructions (Signed)
Before you even begin to attack a weight-loss plan, it pays to remember this: You are not fat. You have fat. Losing weight isn't about blame or shame; it's simply another achievement to accomplish. Dieting is like any other skill-you have to buckle down and work at it. As long as you act in a smart, reasonable way, you'll ultimately get where you want to be. Here are some weight loss pearls for you.  1. It's Not a Diet. It's a Lifestyle Thinking of a diet as something you're on and suffering through only for the short term doesn't work. To shed weight and keep it off, you need to make permanent changes to the way you eat. It's OK to indulge occasionally, of course, but if you cut calories temporarily and then revert to your old way of eating, you'll gain back the weight quicker than you can say yo-yo. Use it to lose it. Research shows that one of the best predictors of long-term weight loss is how many pounds you drop in the first month. For that reason, nutritionists often suggest being stricter for the first two weeks of your new eating strategy to build momentum. Cut out added sugar and alcohol and avoid unrefined carbs. After that, figure out how you can reincorporate them in a way that's healthy and maintainable.  2. There's a Right Way to Exercise Working out burns calories and fat and boosts your metabolism by building muscle. But those trying to lose weight are notorious for overestimating the number of calories they burn and underestimating the amount they take in. Unfortunately, your system is biologically programmed to hold on to extra pounds and that means when you start exercising, your body senses the deficit and ramps up its hunger signals. If you're not diligent, you'll eat everything you burn and then some. Use it to lose it. Cardio gets all the exercise glory, but strength and interval training are the real heroes. They help you build lean muscle, which in turn increases your metabolism and  calorie-burning ability 3. Don't Overreact to Mild Hunger Some people have a hard time losing weight because of hunger anxiety. To them, being hungry is bad-something to be avoided at all costs-so they carry snacks with them and eat when they don't need to. Others eat because they're stressed out or bored. While you never want to get to the point of being ravenous (that's when bingeing is likely to happen), a hunger pang, a craving, or the fact that it's 3:00 p.m. should not send you racing for the vending machine or obsessing about the energy bar in your purse. Ideally, you should put off eating until your stomach is growling and it's difficult to concentrate.  Use it to lose it. When you feel the urge to eat, use the HALT method. Ask yourself, Am I really hungry? Or am I angry or anxious, lonely or bored, or tired? If you're still not certain, try the apple test. If you're truly hungry, an apple should seem delicious; if it doesn't, something else is going on. Or you can try drinking water and making yourself busy, if you are still hungry try a healthy snack.  4. Not All Calories Are Created Equal The mechanics of weight loss are pretty simple: Take in fewer calories than you use for energy. But the kind of food you eat makes all the difference. Processed food that's high in saturated fat and refined starch or sugar can cause inflammation that disrupts the hormone signals that tell  your brain you're full. The result: You eat a lot more.  Use it to lose it. Clean up your diet. Swap in whole, unprocessed foods, including vegetables, lean protein, and healthy fats that will fill you up and give you the biggest nutritional bang for your calorie buck. In a few weeks, as your brain starts receiving regular hunger and fullness signals once again, you'll notice that you feel less hungry overall and naturally start cutting back on the amount you eat.  5. Protein, Produce, and Plant-Based Fats Are Your Weight-Loss  [place] Here's why eating the three Ps regularly will help you drop pounds. Protein fills you up. You need it to build lean muscle, which keeps your metabolism humming so that you can torch more fat. People in a weight-loss program who ate double the recommended daily allowance for protein (about 110 grams for a 150-pound woman) lost 70 percent of their weight from fat, while people who ate the RDA lost only about 40 percent, one study found. Produce is packed with filling fiber. "It's very difficult to consume too many calories if you're eating a lot of vegetables. Example: Three cups of broccoli is a lot of food, yet only 93 calories. (Fruit is another story. It can be easy to overeat and can contain a lot of calories from sugar, so be sure to monitor your intake.) Plant-based fats like olive oil and those in avocados and nuts are healthy and extra satiating.  Use it to lose it. Aim to incorporate each of the three Ps into every meal and snack. People who eat protein throughout the day are able to keep weight off, according to a study in the American Journal of Clinical Nutrition. In addition to meat, poultry and seafood, good sources are beans, lentils, eggs, tofu, and yogurt. As for fat, keep portion sizes in check by measuring out salad dressing, oil, and nut butters (shoot for one to two tablespoons). Finally, eat veggies or a little fruit at every meal. People who did that consumed 308 fewer calories but didn't feel any hungrier than when they didn't eat more produce.  7. How You Eat Is As Important As What You Eat In order for your brain to register that you're full, you need to focus on what you're eating. Sit down whenever you eat, preferably at a table. Turn off the TV or computer, put down your phone, and look at your food. Smell it. Chew slowly, and don't put another bite on your fork until you swallow. When women ate lunch this attentively, they consumed 30 percent less when snacking later than  those who listened to an audiobook at lunchtime, according to a study in the British Journal of Nutrition. 8. Weighing Yourself Really Works The scale provides the best evidence about whether your efforts are paying off. Seeing the numbers tick up or down or stagnate is motivation to keep going-or to rethink your approach. A 2015 study at [employer] found that daily weigh-ins helped people lose more weight, keep it off, and maintain that loss, even after two years. Use it to lose it. Step on the scale at the same time every day for the best results. If your weight shoots up several pounds from one weigh-in to the next, don't freak out. Eating a lot of salt the night before or having your period is the likely culprit. The number should return to normal in a day or two. It's a steady climb that you need to do something about.   9. Too Much Stress and Too Little Sleep Are Your Enemies When you're tired and frazzled, your body cranks up the production of cortisol, the stress hormone that can cause carb cravings. Not getting enough sleep also boosts your levels of ghrelin, a hormone associated with hunger, while suppressing leptin, a hormone that signals fullness and satiety. People on a diet who slept only five and a half hours a night for two weeks lost 55 percent less fat and were hungrier than those who slept eight and a half hours, according to a study in the [place]. Use it to lose it. Prioritize sleep, aiming for seven hours or more a night, which research shows helps lower stress. And make sure you're getting quality zzz's. If a snoring spouse or a fidgety cat wakes you up frequently throughout the night, you may end up getting the equivalent of just four hours of sleep, according to a study from [facility]. Keep pets out of the bedroom, and use a white-noise app to drown out snoring. 10. You Will Hit a plateau-And You Can Bust Through It As you slim down, your  body releases much less leptin, the fullness hormone.  If you're not strength training, start right now. Building muscle can raise your metabolism to help you overcome a plateau. To keep your body challenged and burning calories, incorporate new moves and more intense intervals into your workouts or add another sweat session to your weekly routine. Alternatively, cut an extra 100 calories or so a day from your diet. Now that you've lost weight, your body simply doesn't need as much fuel.   Ways to cut 100 calories  1. Eat your eggs with hot sauce OR salsa instead of cheese.  Eggs are great for breakfast, but many people consider eggs and cheese to be BFFs. Instead of cheese-1 oz. of cheddar has 114 calories-top your eggs with hot sauce, which contains no calories and helps with satiety and metabolism. Salsa is also a great option!!  2. Top your toast, waffles or pancakes with mashed berries instead of jelly or syrup. Half a cup of berries-fresh, frozen or thawed-has about 40 calories, compared with 2 tbsp. of maple syrup or jelly, which both have about 100 calories. The berries will also give you a good punch of fiber, which helps keep you full and satisfied and won't spike blood sugar quickly like the jelly or syrup. 3. Swap the non-fat latte for black coffee with a splash of half-and-half. Contrary to its name, that non-fat latte has 130 calories and a startling 19g of carbohydrates per 16 oz. serving. Replacing that 'light' drinkable dessert with a black coffee with a splash of half-and-half saves you more than 100 calories per 16 oz. serving. 4. Sprinkle salads with freeze-dried raspberries instead of dried cranberries. If you want a sweet addition to your nutritious salad, stay away from dried cranberries. They have a whopping 130 calories per  cup and 30g carbohydrates. Instead, sprinkle freeze-dried raspberries guilt-free and save more than 100 calories per  cup serving, adding 3g of belly-filling  fiber. 5. Go for mustard in place of mayo on your sandwich. Mustard can add really nice flavor to any sandwich, and there are tons of varieties, from spicy to honey. A serving of mayo is 95 calories, versus 10 calories in a serving of mustard. 6. Choose a DIY salad dressing instead of the store-bought kind. Mix Dijon or whole grain mustard with low-fat Kefir or red wine vinegar  and garlic. 7. Use hummus as a spread instead of a dip. Use hummus as a spread on a high-fiber cracker or tortilla with a sandwich and save on calories without sacrificing taste. 8. Pick just one salad "accessory." Salad isn't automatically a calorie winner. It's easy to over-accessorize with toppings. Instead of topping your salad with nuts, avocado and cranberries (all three will clock in at 313 calories), just pick one. The next day, choose a different accessory, which will also keep your salad interesting. You don't wear all your jewelry every day, right? 9. Ditch the white pasta in favor of spaghetti squash. One cup of cooked spaghetti squash has about 40 calories, compared with traditional spaghetti, which comes with more than 200. Spaghetti squash is also nutrient-dense. It's a good source of fiber and Vitamins A and C, and it can be eaten just like you would eat pasta-with a great tomato sauce and [place] meatballs or with pesto, tofu and spinach, for example. 10. Dress up your chili, soups and stews with non-fat [place] yogurt instead of sour cream. Just a 'dollop' of sour cream can set you back 115 calories and a whopping 12g of fat-seven of which are of the artery-clogging variety. Added bonus: [place] yogurt is packed with muscle-building protein, calcium and B Vitamins. 11. Mash cauliflower instead of mashed potatoes. One cup of traditional mashed potatoes-in all their creamy goodness-has more than 200 calories, compared to mashed cauliflower, which you can typically eat for less than 100 calories per 1 cup serving.  Cauliflower is a great source of the antioxidant indole-3-carbinol (I3C), which may help reduce the risk of some cancers, like breast cancer. 12. Ditch the ice cream sundae in favor of a [place] yogurt parfait. Instead of a cup of ice cream or fro-yo for dessert, try 1 cup of nonfat Greek yogurt topped with fresh berries and a sprinkle of cacao nibs. Both toppings are packed with antioxidants, which can help reduce cellular inflammation and oxidative damage. And the comparison is a no-brainer: One cup of ice cream has about 275 calories; one cup of frozen yogurt has about 230; and a cup of Greek yogurt has just 130, plus twice the protein, so you're less likely to return to the freezer for a second helping. 13. Put olive oil in a spray container instead of using it directly from the bottle. Each tablespoon of olive oil is 120 calories and 15g of fat. Use a mister instead of pouring it straight into the pan or onto a salad. This allows for portion control and will save you more than 100 calories. 14. When baking, substitute canned pumpkin for butter or oil. Canned pumpkin-not pumpkin pie mix-is loaded with Vitamin A, which is important for skin and eye health, as well as immunity. And the comparisons are pretty crazy:  cup of canned pumpkin has about 40 calories, compared to butter or oil, which has more than 800 calories. Yes, 800 calories. Applesauce and mashed banana can also serve as good substitutions for butter or oil, usually in a 1:1 ratio. 15. Top casseroles with high-fiber cereal instead of breadcrumbs. Breadcrumbs are typically made with white bread, while breakfast cereals contain 5-9g of fiber per serving. Not only will you save more than 150 calories per  cup serving, the swap will also keep you more full and you'll get a metabolism boost from the added fiber. 16. Snack on pistachios instead of macadamia nuts. Believe it or not, you get the same amount of calories from 35  pistachios (100  calories) as you would from only five macadamia nuts. 17. Chow down on kale chips rather than potato chips. This is my favorite 'don't knock it 'till you try it' swap. Kale chips are so easy to make at home, and you can spice them up with a little grated parmesan or chili powder. Plus, they're a mere fraction of the calories of potato chips, but with the same crunch factor we crave so often. 18. Add seltzer and some fruit slices to your cocktail instead of soda or fruit juice. One cup of soda or fruit juice can pack on as much as 140 calories. Instead, use seltzer and fruit slices. The fruit provides valuable phytochemicals, such as flavonoids and anthocyanins, which help to combat cancer and stave off the aging process.

## 2015-02-26 ENCOUNTER — Ambulatory Visit: Payer: Self-pay | Admitting: Surgery

## 2015-02-26 ENCOUNTER — Other Ambulatory Visit: Payer: Self-pay

## 2015-02-26 DIAGNOSIS — K769 Liver disease, unspecified: Secondary | ICD-10-CM

## 2015-02-26 LAB — LIPID PANEL
Cholesterol: 125 mg/dL (ref 0–200)
HDL: 43 mg/dL — AB (ref >=46)
LDL Cholesterol: 68 mg/dL (ref 0–99)
TRIGLYCERIDES: 69 mg/dL (ref <150)
Total CHOL/HDL Ratio: 2.9 Ratio
VLDL: 14 mg/dL (ref 0–40)

## 2015-02-26 LAB — HEPATIC FUNCTION PANEL
ALT: 23 U/L (ref 0–35)
AST: 22 U/L (ref 0–37)
Albumin: 4.2 g/dL (ref 3.5–5.2)
Alkaline Phosphatase: 53 U/L (ref 39–117)
Bilirubin, Direct: 0.3 mg/dL (ref 0.0–0.3)
Indirect Bilirubin: 0.9 mg/dL (ref 0.2–1.2)
TOTAL PROTEIN: 6.9 g/dL (ref 6.0–8.3)
Total Bilirubin: 1.2 mg/dL (ref 0.2–1.2)

## 2015-02-26 LAB — TSH: TSH: 3.022 u[IU]/mL (ref 0.350–4.500)

## 2015-02-26 LAB — MAGNESIUM: MAGNESIUM: 1.9 mg/dL (ref 1.5–2.5)

## 2015-02-26 LAB — BASIC METABOLIC PANEL WITH GFR
BUN: 19 mg/dL (ref 6–23)
CALCIUM: 9.3 mg/dL (ref 8.4–10.5)
CO2: 30 mEq/L (ref 19–32)
CREATININE: 1.14 mg/dL — AB (ref 0.50–1.10)
Chloride: 99 mEq/L (ref 96–112)
GFR, EST AFRICAN AMERICAN: 57 mL/min — AB
GFR, EST NON AFRICAN AMERICAN: 50 mL/min — AB
GLUCOSE: 67 mg/dL — AB (ref 70–99)
POTASSIUM: 3.7 meq/L (ref 3.5–5.3)
Sodium: 139 mEq/L (ref 135–145)

## 2015-02-26 LAB — HEMOGLOBIN A1C
Hgb A1c MFr Bld: 5.8 % — ABNORMAL HIGH (ref <5.7)
MEAN PLASMA GLUCOSE: 120 mg/dL — AB (ref <117)

## 2015-02-26 LAB — VITAMIN D 25 HYDROXY (VIT D DEFICIENCY, FRACTURES): Vit D, 25-Hydroxy: 51 ng/mL (ref 30–100)

## 2015-02-26 LAB — INSULIN, FASTING: Insulin fasting, serum: 15.3 u[IU]/mL (ref 2.0–19.6)

## 2015-03-06 ENCOUNTER — Encounter: Payer: Self-pay | Admitting: *Deleted

## 2015-03-09 ENCOUNTER — Ambulatory Visit
Admission: RE | Admit: 2015-03-09 | Discharge: 2015-03-09 | Disposition: A | Discharge status: Home / Self Care | Payer: Medicare Other | Source: Ambulatory Visit | Attending: Physician Assistant | Admitting: Physician Assistant

## 2015-03-09 DIAGNOSIS — K769 Liver disease, unspecified: Secondary | ICD-10-CM

## 2015-03-09 MED ORDER — GADOBENATE DIMEGLUMINE 529 MG/ML IV SOLN
15.0000 mL | Freq: Once | INTRAVENOUS | Status: AC | PRN
  Administered 2015-03-09: 15 mL via INTRAVENOUS

## 2015-03-10 ENCOUNTER — Encounter: Payer: Self-pay | Admitting: Physician Assistant

## 2015-03-10 ENCOUNTER — Other Ambulatory Visit: Payer: Self-pay | Admitting: Physician Assistant

## 2015-03-10 DIAGNOSIS — R918 Other nonspecific abnormal finding of lung field: Secondary | ICD-10-CM

## 2015-03-16 ENCOUNTER — Telehealth: Payer: Self-pay | Admitting: Physician Assistant

## 2015-03-16 ENCOUNTER — Ambulatory Visit
Admission: RE | Admit: 2015-03-16 | Discharge: 2015-03-16 | Disposition: A | Discharge status: Home / Self Care | Payer: Medicare Other | Source: Ambulatory Visit | Attending: Physician Assistant | Admitting: Physician Assistant

## 2015-03-16 DIAGNOSIS — R918 Other nonspecific abnormal finding of lung field: Secondary | ICD-10-CM

## 2015-03-16 MED ORDER — IOHEXOL 300 MG/ML  SOLN
75.0000 mL | Freq: Once | INTRAMUSCULAR | Status: AC | PRN
  Administered 2015-03-16: 75 mL via INTRAVENOUS

## 2015-03-16 NOTE — Telephone Encounter (Signed)
69 year old WF with history of total AB hysterectomy in 1995 has been on estrogen since that time has been having RUQ pain, had AB US showed abnormal liver lesion, MRI liver showed hemangioma however also found numerous lung nodules in LLL.  CT chest showed: IMPRESSION: Innumerable bilateral lung nodules throughout all lobes concerning for metastatic disease. Larger dominant lesion superior segment left lower lobe as seen on MRI appears to represent mass with adjacent atelectasis.  Biopsy is recommended. Any of the larger bilateral lung masses would be amenable to CT-guided biopsy.  She has had:  normal MGM 08/2014 Normal Colon 10/2012 Tapering estrogen therapy now  No weight loss, night sweats, or family history of family cancer.  Has had right sided AB pain and left leg pain worse at night.   Discussed with patient, told very concerning for cancer, will set up PET scan and refer to pulmonary. Patient aware.

## 2015-03-17 ENCOUNTER — Other Ambulatory Visit: Payer: Self-pay | Admitting: Physician Assistant

## 2015-03-17 DIAGNOSIS — R918 Other nonspecific abnormal finding of lung field: Secondary | ICD-10-CM

## 2015-03-25 ENCOUNTER — Other Ambulatory Visit: Payer: Self-pay | Admitting: Physician Assistant

## 2015-03-25 ENCOUNTER — Encounter (HOSPITAL_COMMUNITY)
Admission: RE | Admit: 2015-03-25 | Discharge: 2015-03-25 | Disposition: A | Discharge status: Home / Self Care | Payer: Medicare Other | Source: Ambulatory Visit | Attending: Physician Assistant | Admitting: Physician Assistant

## 2015-03-25 DIAGNOSIS — R918 Other nonspecific abnormal finding of lung field: Secondary | ICD-10-CM

## 2015-03-25 DIAGNOSIS — J984 Other disorders of lung: Secondary | ICD-10-CM | POA: Diagnosis present

## 2015-03-25 LAB — GLUCOSE, CAPILLARY: Glucose-Capillary: 100 mg/dL — ABNORMAL HIGH (ref 65–99)

## 2015-03-25 MED ORDER — FLUDEOXYGLUCOSE F - 18 (FDG) INJECTION
8.4000 | Freq: Once | INTRAVENOUS | Status: AC | PRN
  Administered 2015-03-25: 8.4 via INTRAVENOUS

## 2015-03-26 ENCOUNTER — Other Ambulatory Visit: Payer: Self-pay | Admitting: Internal Medicine

## 2015-03-26 ENCOUNTER — Other Ambulatory Visit: Payer: Self-pay | Admitting: Physician Assistant

## 2015-03-26 DIAGNOSIS — R918 Other nonspecific abnormal finding of lung field: Secondary | ICD-10-CM

## 2015-03-29 ENCOUNTER — Other Ambulatory Visit: Payer: Self-pay | Admitting: Radiology

## 2015-03-29 ENCOUNTER — Encounter: Payer: Self-pay | Admitting: Physician Assistant

## 2015-03-31 ENCOUNTER — Other Ambulatory Visit: Payer: Self-pay | Admitting: Radiology

## 2015-03-31 ENCOUNTER — Institutional Professional Consult (permissible substitution): Payer: Medicare Other | Admitting: Internal Medicine

## 2015-04-01 ENCOUNTER — Ambulatory Visit (HOSPITAL_COMMUNITY)
Admission: RE | Admit: 2015-04-01 | Discharge: 2015-04-01 | Disposition: A | Discharge status: Home / Self Care | Payer: Medicare Other | Source: Ambulatory Visit | Attending: Interventional Radiology | Admitting: Interventional Radiology

## 2015-04-01 ENCOUNTER — Encounter (HOSPITAL_COMMUNITY): Payer: Self-pay

## 2015-04-01 ENCOUNTER — Ambulatory Visit (HOSPITAL_COMMUNITY)
Admission: RE | Admit: 2015-04-01 | Discharge: 2015-04-01 | Disposition: A | Discharge status: Home / Self Care | Payer: Medicare Other | Source: Ambulatory Visit | Attending: Physician Assistant | Admitting: Physician Assistant

## 2015-04-01 VITALS — BP 116/65 | HR 70 | Temp 97.6°F | Resp 16 | Ht 60.0 in | Wt 166.0 lb

## 2015-04-01 DIAGNOSIS — D1803 Hemangioma of intra-abdominal structures: Secondary | ICD-10-CM | POA: Insufficient documentation

## 2015-04-01 DIAGNOSIS — R918 Other nonspecific abnormal finding of lung field: Secondary | ICD-10-CM

## 2015-04-01 DIAGNOSIS — R7309 Other abnormal glucose: Secondary | ICD-10-CM | POA: Insufficient documentation

## 2015-04-01 DIAGNOSIS — Z79899 Other long term (current) drug therapy: Secondary | ICD-10-CM | POA: Diagnosis not present

## 2015-04-01 DIAGNOSIS — I10 Essential (primary) hypertension: Secondary | ICD-10-CM | POA: Insufficient documentation

## 2015-04-01 DIAGNOSIS — C3432 Malignant neoplasm of lower lobe, left bronchus or lung: Secondary | ICD-10-CM | POA: Diagnosis not present

## 2015-04-01 DIAGNOSIS — E785 Hyperlipidemia, unspecified: Secondary | ICD-10-CM | POA: Insufficient documentation

## 2015-04-01 DIAGNOSIS — C3490 Malignant neoplasm of unspecified part of unspecified bronchus or lung: Secondary | ICD-10-CM

## 2015-04-01 DIAGNOSIS — Z87891 Personal history of nicotine dependence: Secondary | ICD-10-CM | POA: Insufficient documentation

## 2015-04-01 DIAGNOSIS — Z7982 Long term (current) use of aspirin: Secondary | ICD-10-CM | POA: Diagnosis not present

## 2015-04-01 DIAGNOSIS — E079 Disorder of thyroid, unspecified: Secondary | ICD-10-CM | POA: Diagnosis not present

## 2015-04-01 DIAGNOSIS — R7303 Prediabetes: Secondary | ICD-10-CM

## 2015-04-01 DIAGNOSIS — Z9889 Other specified postprocedural states: Secondary | ICD-10-CM

## 2015-04-01 LAB — CBC
HEMATOCRIT: 37.1 % (ref 36.0–46.0)
Hemoglobin: 13 g/dL (ref 12.0–15.0)
MCH: 29.4 pg (ref 26.0–34.0)
MCHC: 35 g/dL (ref 30.0–36.0)
MCV: 83.9 fL (ref 78.0–100.0)
Platelets: 169 10*3/uL (ref 150–400)
RBC: 4.42 MIL/uL (ref 3.87–5.11)
RDW: 12.7 % (ref 11.5–15.5)
WBC: 7.2 10*3/uL (ref 4.0–10.5)

## 2015-04-01 LAB — APTT: aPTT: 34 seconds (ref 24–37)

## 2015-04-01 LAB — PROTIME-INR
INR: 1.08 (ref 0.00–1.49)
Prothrombin Time: 14.2 seconds (ref 11.6–15.2)

## 2015-04-01 MED ORDER — SODIUM CHLORIDE 0.9 % IV SOLN: Freq: Once | INTRAVENOUS | Status: DC

## 2015-04-01 MED ORDER — FENTANYL CITRATE (PF) 100 MCG/2ML IJ SOLN
INTRAMUSCULAR | Status: AC | PRN
  Administered 2015-04-01: 50 ug via INTRAVENOUS

## 2015-04-01 MED ORDER — FENTANYL CITRATE (PF) 100 MCG/2ML IJ SOLN
INTRAMUSCULAR | Status: AC
  Filled 2015-04-01: qty 2

## 2015-04-01 MED ORDER — LIDOCAINE HCL (PF) 1 % IJ SOLN
INTRAMUSCULAR | Status: AC
Start: 2015-04-01 — End: 2015-04-01
  Filled 2015-04-01: qty 30

## 2015-04-01 MED ORDER — MIDAZOLAM HCL 2 MG/2ML IJ SOLN
INTRAMUSCULAR | Status: AC
  Filled 2015-04-01: qty 2

## 2015-04-01 MED ORDER — LIDOCAINE-EPINEPHRINE 1 %-1:100000 IJ SOLN
INTRAMUSCULAR | Status: AC
  Filled 2015-04-01: qty 1

## 2015-04-01 MED ORDER — MIDAZOLAM HCL 2 MG/2ML IJ SOLN
INTRAMUSCULAR | Status: AC | PRN
  Administered 2015-04-01 (×2): 1 mg via INTRAVENOUS

## 2015-04-01 NOTE — H&P (Signed)
Chief Complaint: "I am here for a lung biopsy."  Referring Physician(s): Collier,Amanda PA-C  History of Present Illness: Elizabeth [name] is a 69 y.o. female with PMHx of HTN, HLP and thyroid disease. She states she underwent an [place] screening in her PCP office and an area on her liver was seen so she underwent a MRI which revealed a hemangioma. The MRI noted an area of concern on her LLL of lung. CT Chest done revealed LLL lung nodules and F/U PET these nodules were hypermetabolic. She has been scheduled today for image guided biopsy of LLL lung nodule. She denies any chest pain, shortness of breath or palpitations. She denies any active signs of bleeding or excessive bruising. She denies any recent fever or chills. The patient denies any history of sleep apnea or chronic oxygen use. She has previously tolerated sedation without complications. She denies any weight loss or appetite changes, she denies any tobacco use or history of tobacco use. She denies any hemoptysis.    Past Medical History  Diagnosis Date  . Hypertension   . Hyperlipidemia   . Thyroid disease   . Prediabetes   . Allergy   . Anxiety   . Insomnia     Past Surgical History  Procedure Laterality Date  . Tonsillectomy and adenoidectomy    . Abdominal hysterectomy  1995    w BSO    Allergies: Penicillins  Medications: Prior to Admission medications   Medication Sig Start Date End Date Taking? Authorizing Provider  ALPRAZolam Duanne Moron) 1 MG tablet TAKE 1 TABLET BY MOUTH THREE TIMES DAILY 12/07/14 06/07/15 Yes [name], MD  aspirin 81 MG tablet Take 81 mg by mouth daily.   Yes Historical Provider, MD  EST ESTROGENS-METHYLTEST HS 0.625-1.25 MG per tablet TAKE 1 TABLET BY MOUTH EVERY DAY 07/07/14  Yes [name], MD  levothyroxine (SYNTHROID, LEVOTHROID) 50 MCG tablet TAKE 1 TABLET BY MOUTH ONCE DAILY 06/25/14 06/26/15 Yes [name], MD  meloxicam (MOBIC) 15 MG tablet TAKE 1/2 TO 1 TABLET BY MOUTH  DAILY WITH FOOD FOR PAIN AND INFLAMMATION 02/11/15  Yes [name], MD  montelukast (SINGULAIR) 10 MG tablet TAKE 1 TABLET BY MOUTH EVERY DAY 12/29/14  Yes [name], MD  simvastatin (ZOCOR) 20 MG tablet TAKE 1 TABLET BY MOUTH EVERY NIGHT AT BEDTIME 05/31/14 06/01/15 Yes [name], MD  triamterene-hydrochlorothiazide (MAXZIDE) 75-50 MG per tablet TAKE 1 TABLET BY MOUTH EVERY DAY 02/07/15  Yes [name], MD     Family History  Problem Relation Age of Onset  . Liver disease Mother   . Alcohol abuse Mother   . ALS Father   . Hypertension Father     History   Social History  . Marital Status: Married    Spouse Name: N/A  . Number of Children: N/A  . Years of Education: N/A   Social History Main Topics  . Smoking status: Former Smoker    Quit date: 10/23/1974  . Smokeless tobacco: Never Used  . Alcohol Use: No     Comment: Rare  . Drug Use: No  . Sexual Activity: Not on file   Other Topics Concern  . None   Social History Narrative   Review of Systems: A 12 point ROS discussed and pertinent positives are indicated in the HPI above.  All other systems are negative.  Review of Systems  Vital Signs: BP 125/76 mmHg  Pulse 85  Temp(Src) 97.4 F (36.3 C)  Resp 20  Ht  5' (1.524 m)  Wt 166 lb (75.297 kg)  BMI 32.42 kg/m2  SpO2 100%  Physical Exam  Constitutional: She is oriented to person, place, and time. No distress.  HENT:  Head: Normocephalic and atraumatic.  Neck: No tracheal deviation present.  Cardiovascular: Normal rate and regular rhythm.  Exam reveals no gallop and no friction rub.   No murmur heard. Pulmonary/Chest: Effort normal and breath sounds normal. No respiratory distress. She has no wheezes. She has no rales.  Abdominal: Soft. Bowel sounds are normal. She exhibits no distension. There is no tenderness.  Neurological: She is alert and oriented to person, place, and time.  Skin: She is not diaphoretic.  Psychiatric: She has a normal  mood and affect. Her behavior is normal. Thought content normal.    Mallampati Score:  MD Evaluation Airway: WNL Heart: WNL Abdomen: WNL Chest/ Lungs: WNL ASA  Classification: 2 Mallampati/Airway Score: Two  Imaging: Ct Chest W Contrast  03/16/2015   CLINICAL DATA:  Mass seen in left lower lobe on recent abdominal MRI  EXAM: CT CHEST WITH CONTRAST  TECHNIQUE: Multidetector CT imaging of the chest was performed during intravenous contrast administration.  CONTRAST:  24m OMNIPAQUE IOHEXOL 300 MG/ML  SOLN  COMPARISON:  03/09/2015  FINDINGS: There are innumerable bilateral pulmonary nodules. These range in size from about 5 mm to 2 cm. They demonstrate irregular indistinct borders, the larger ones demonstrating spiculation. There is a rounded area of opacity in the superior segment of the left lower lobe, measuring 27 x 27 x 30 mm. It is located in the perihilar area. There is consolidation inferior to it with air bronchograms within an which may represent surrounding atelectasis.  There is no significant pleural or pericardial effusion. There are small nonpathologic mediastinal lymph nodes. There is no significant hilar adenopathy.  The known hemangioma in the posterior right lobe of the liver is seen on the current examination. There are no acute musculoskeletal findings.  IMPRESSION: Innumerable bilateral lung nodules throughout all lobes concerning for metastatic disease. Larger dominant lesion superior segment left lower lobe as seen on MRI appears to represent mass with adjacent atelectasis.  Biopsy is recommended. Any of the larger bilateral lung masses would be amenable to CT-guided biopsy.   Electronically Signed   By: RSkipper ClicheM.D.   On: 03/16/2015 14:27   Mr Abdomen W Wo Contrast  03/10/2015   CLINICAL DATA:  Subsequent encounter for liver lesions seen on ultrasound  EXAM: MRI ABDOMEN WITHOUT AND WITH CONTRAST  TECHNIQUE: Multiplanar multisequence MR imaging of the abdomen was  performed both before and after the administration of intravenous contrast.  CONTRAST:  144mMULTIHANCE GADOBENATE DIMEGLUMINE 529 MG/ML IV SOLN  COMPARISON:  Ultrasound exam from 02/23/2015.  FINDINGS: Lower chest: Images which include the lower chest show numerous pulmonary nodules bilaterally (see images 1-7 of series 8). There is a large lesion in the left lower lung which may be consolidation or soft tissue. This lesion measures 3.8 cm.  Hepatobiliary: The dominant lesion in the posterior right liver measures 3.3 x 4.0 cm. 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 No discrete lesion is seen in the left liver is suggested on the recent ultrasound exam. A second tiny T1 hypo intense, T2 hyperintense, nonenhancing lesion is seen in the inferior right liver adjacent to the gallbladder fossa (see image 16 of series 8).  Pancreas: No focal mass lesion. No dilatation of the main duct. No intraparenchymal cyst. No peripancreatic  edema.  Spleen: No splenomegaly. No focal mass lesion.  Adrenals/Urinary Tract: No adrenal nodule or mass. Kidneys are unremarkable.  Stomach/Bowel: Stomach is nondistended. No gastric wall thickening. No evidence of outlet obstruction. Duodenum is normally positioned as is the ligament of Treitz.  Vascular/Lymphatic: No abdominal aortic aneurysm. No evidence for lymphadenopathy in the abdomen.  Other: No intraperitoneal free fluid.  Musculoskeletal: No abnormal marrow signal within the visualized bony anatomy.  IMPRESSION: 1. Multiple bilateral pulmonary nodules with a masslike lesion in the left lower lobe. Dedicated CT chest with contrast recommended to further evaluate. 2. 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 A second tiny inferior right liver lesion measures only 7 mm in cannot be fully characterized. This is most likely a benign finding, but attention on followup imaging could be used to ensure stability. 3. These  results will be called to the ordering clinician or representative by the Radiologist Assistant, and communication documented in the PACS or zVision Dashboard.   Electronically Signed   By: Misty Stanley M.D.   On: 03/10/2015 08:53   Nm Pet Image Initial (pi) Skull Base To Thigh  03/25/2015   CLINICAL DATA:  Initial treatment strategy for pulmonary nodules.  EXAM: NUCLEAR MEDICINE PET SKULL BASE TO THIGH  TECHNIQUE: 8.4 mCi F-18 FDG was injected intravenously. Full-ring PET imaging was performed from the skull base to thigh after the radiotracer. CT data was obtained and used for attenuation correction and anatomic localization.  FASTING BLOOD GLUCOSE:  Value: 100 mg/dl  COMPARISON:  CT scan 03/16/2015  FINDINGS: NECK  No hypermetabolic lymph nodes in the neck.  CHEST  Innumerable bilateral pulmonary nodules are again demonstrated as on the recent CT scan. These are metabolically active.  18 mm right apical lesion on CT image 49 has SUV max of 5.6.  19.5 mm right lower lobe lesion on image number 61 has SUV max of 7.2.  Dominant 4.2 cm left lower lobe lesion has SUV max of 10.7.  No enlarged or hypermetabolic mediastinal or hilar lymph nodes suggest adenopathy.  ABDOMEN/PELVIS  No abnormal hypermetabolic activity within the liver, pancreas, adrenal glands, or spleen. No hypermetabolic lymph nodes in the abdomen or pelvis. Stable hepatic hemangioma. No obvious hepatic metastasis. I do not identify a definite primary neoplasm in the abdomen/ pelvis. The uterus and ovaries are surgically absent.  SKELETON  FDG activity noted along the right iliac crest laterally. There are some bony changes and I suspect this is due to a prior avulsion injury involving the gluteus medius muscle. I do not see an obvious mass or destructive bone lesion on the CT scan. There are few small scattered sclerotic bone lesions which are likely benign bone islands.  IMPRESSION: 1. Diffuse pulmonary metastatic disease with a 4 cm dominant left  lower low lung lesion. No enlarged were hypermetabolic mediastinal or hilar adenopathy. 2. No primary neoplasm is identified in the neck, abdomen or pelvis. 3. No findings for osseous metastatic disease.   Electronically Signed   By: Marijo Sanes M.D.   On: 03/25/2015 09:33    Labs:  CBC:  Recent Labs  07/31/14 1053 11/03/14 1240 02/25/15 1146 04/01/15 0804  WBC 7.1 9.1 8.2 7.2  HGB 14.3 14.7 14.1 13.0  HCT 42.7 45.5 41.7 37.1  PLT 240 242 203 169    COAGS:  Recent Labs  04/01/15 0804  INR 1.08  APTT 34    BMP:  Recent Labs  04/20/14 1104 07/31/14 1053 11/03/14 1240 02/25/15 1146  NA 139 139 138 139  K 4.1 3.9 4.0 3.7  CL 102 98 99 99  CO2 '30 28 28 30  '$ GLUCOSE 73 78 80 67*  BUN '18 18 16 19  '$ CALCIUM 8.7 9.6 9.0 9.3  CREATININE 0.91 1.07 1.06 1.14*  GFRNONAA 65 54* 54* 50*  GFRAA 76 62 63 57*    LIVER FUNCTION TESTS:  Recent Labs  04/20/14 1104 07/31/14 1053 11/03/14 1240 02/25/15 1146  BILITOT 1.2 1.3* 1.3* 1.2  AST '24 25 24 22  '$ ALT '21 27 23 23  '$ ALKPHOS 51 65 61 53  PROT 6.5 7.4 7.1 6.9  ALBUMIN 3.8 4.4 4.2 4.2    Assessment and Plan: Incidental left lower lobe lung nodules found on MRI when evaluating liver lesion/hemangioma, asymptomatic   PET 04/23/61 metabolic activity seen in LLL lung nodules.  Seen by Primary care physician  Scheduled today for image guided LLL lung nodule biopsy with moderate sedation The patient has been NPO, no blood thinners taken, labs and vitals have been reviewed. Risks and Benefits discussed with the patient including, but not limited to bleeding, hemoptysis, respiratory failure requiring intubation, infection, pneumothorax requiring chest tube placement, stroke from air embolism or even death. All of the patient's questions were answered, patient is agreeable to proceed. Consent signed and in chart.   Thank you for this interesting consult.  I greatly enjoyed meeting KARL KNARR and look forward to  participating in their care.  SignedHedy Jacob 04/01/2015, 8:51 AM   I spent a total of 30 minutes in face to face in clinical consultation, greater than 50% of which was counseling/coordinating care for lung nodules.

## 2015-04-01 NOTE — Procedures (Signed)
Technically successful CT guided biopsy of dominant hypermetabolic nodule/mass within the left lower lobe No immediate post procedural complications.

## 2015-04-01 NOTE — Discharge Instructions (Signed)
Needle Biopsy of Lung, Care After °Refer to this sheet in the next few weeks. These instructions provide you with information on caring for yourself after your procedure. Your health care provider may also give you more specific instructions. Your treatment has been planned according to current medical practices, but problems sometimes occur. Call your health care provider if you have any problems or questions after your procedure. °WHAT TO EXPECT AFTER THE PROCEDURE °· A bandage will be applied over the area where the needle was inserted. You may be asked to apply pressure to the bandage for several minutes to ensure there is minimal bleeding. °· In most cases, you can leave when your needle biopsy procedure is completed. Do not drive yourself home. Someone else should take you home. °· If you received an IV sedative or general anesthetic, you will be taken to a comfortable place to relax while the medicine wears off. °· If you have upcoming travel scheduled, talk to your health care provider about when it is safe to travel by air after the procedure. °HOME CARE INSTRUCTIONS °· Expect to take it easy for the rest of the day. °· Protect the area where you received the needle biopsy by keeping the bandage in place for as long as instructed. °· You may feel some mild pain or discomfort in the area, but this should stop in a day or two. °· Take medicines only as directed by your health care provider. °SEEK MEDICAL CARE IF:  °· You have pain at the biopsy site that worsens or is not helped by medicine. °· You have swelling or drainage at the needle biopsy site. °· You have a fever. °SEEK IMMEDIATE MEDICAL CARE IF:  °· You have new or worsening shortness of breath. °· You have chest pain. °· You are coughing up blood. °· You have bleeding that does not stop with pressure or a bandage. °· You develop light-headedness or fainting. °Document Released: 08/06/2007 Document Revised: 02/23/2014 Document Reviewed:  03/03/2013 °[employer]® Patient Information ©2015 [employer]. This information is not intended to replace advice given to you by your health care provider. Make sure you discuss any questions you have with your health care provider. ° °

## 2015-04-02 ENCOUNTER — Encounter: Payer: Self-pay | Admitting: Physician Assistant

## 2015-04-05 ENCOUNTER — Other Ambulatory Visit: Payer: Self-pay | Admitting: *Deleted

## 2015-04-05 ENCOUNTER — Telehealth: Payer: Self-pay | Admitting: *Deleted

## 2015-04-05 ENCOUNTER — Other Ambulatory Visit: Payer: Self-pay | Admitting: Physician Assistant

## 2015-04-05 DIAGNOSIS — C3492 Malignant neoplasm of unspecified part of left bronchus or lung: Secondary | ICD-10-CM

## 2015-04-05 DIAGNOSIS — C3490 Malignant neoplasm of unspecified part of unspecified bronchus or lung: Secondary | ICD-10-CM

## 2015-04-05 HISTORY — DX: Malignant neoplasm of unspecified part of left bronchus or lung: C34.92

## 2015-04-05 NOTE — Telephone Encounter (Signed)
Oncology Nurse Navigator Documentation  Oncology Nurse Navigator Flowsheets 04/05/2015  Referral date to RadOnc/MedOnc 04/05/2015  Navigator Encounter Type Introductory phone call.  I received a referral today on Ms. Rochette.  I have set her up for clinic on 04/15/15 arrive at cancer center at 12:30.  She verbalized understanding appt time and place.   Treatment Phase Other/Pre-tx  Barriers/Navigation Needs No barriers at this time  Time Spent with Patient 15

## 2015-04-15 ENCOUNTER — Other Ambulatory Visit (HOSPITAL_BASED_OUTPATIENT_CLINIC_OR_DEPARTMENT_OTHER): Payer: Medicare Other

## 2015-04-15 ENCOUNTER — Encounter: Payer: Self-pay | Admitting: Internal Medicine

## 2015-04-15 ENCOUNTER — Ambulatory Visit
Admission: RE | Admit: 2015-04-15 | Discharge: 2015-04-15 | Disposition: A | Discharge status: Home / Self Care | Payer: Medicare Other | Source: Ambulatory Visit | Attending: Radiation Oncology | Admitting: Radiation Oncology

## 2015-04-15 ENCOUNTER — Encounter: Payer: Self-pay | Admitting: *Deleted

## 2015-04-15 ENCOUNTER — Ambulatory Visit (HOSPITAL_BASED_OUTPATIENT_CLINIC_OR_DEPARTMENT_OTHER): Payer: Medicare Other | Admitting: Internal Medicine

## 2015-04-15 ENCOUNTER — Ambulatory Visit: Payer: Medicare Other | Attending: Internal Medicine | Admitting: Physical Therapy

## 2015-04-15 ENCOUNTER — Telehealth: Payer: Self-pay | Admitting: Internal Medicine

## 2015-04-15 VITALS — BP 147/78 | HR 82 | Temp 97.9°F | Resp 19 | Ht 60.0 in | Wt 167.5 lb

## 2015-04-15 DIAGNOSIS — C3432 Malignant neoplasm of lower lobe, left bronchus or lung: Secondary | ICD-10-CM | POA: Diagnosis not present

## 2015-04-15 DIAGNOSIS — C3490 Malignant neoplasm of unspecified part of unspecified bronchus or lung: Secondary | ICD-10-CM

## 2015-04-15 DIAGNOSIS — R5381 Other malaise: Secondary | ICD-10-CM | POA: Diagnosis present

## 2015-04-15 DIAGNOSIS — C3492 Malignant neoplasm of unspecified part of left bronchus or lung: Secondary | ICD-10-CM

## 2015-04-15 DIAGNOSIS — Z8739 Personal history of other diseases of the musculoskeletal system and connective tissue: Secondary | ICD-10-CM

## 2015-04-15 LAB — CBC WITH DIFFERENTIAL/PLATELET
BASO%: 0.6 % (ref 0.0–2.0)
Basophils Absolute: 0 10*3/uL (ref 0.0–0.1)
EOS ABS: 0.1 10*3/uL (ref 0.0–0.5)
EOS%: 2 % (ref 0.0–7.0)
HCT: 40.3 % (ref 34.8–46.6)
HGB: 13.7 g/dL (ref 11.6–15.9)
LYMPH%: 25.8 % (ref 14.0–49.7)
MCH: 29.3 pg (ref 25.1–34.0)
MCHC: 34 g/dL (ref 31.5–36.0)
MCV: 86.1 fL (ref 79.5–101.0)
MONO#: 0.6 10*3/uL (ref 0.1–0.9)
MONO%: 9.1 % (ref 0.0–14.0)
NEUT%: 62.5 % (ref 38.4–76.8)
NEUTROS ABS: 4 10*3/uL (ref 1.5–6.5)
Platelets: 185 10*3/uL (ref 145–400)
RBC: 4.68 10*6/uL (ref 3.70–5.45)
RDW: 12.8 % (ref 11.2–14.5)
WBC: 6.4 10*3/uL (ref 3.9–10.3)
lymph#: 1.7 10*3/uL (ref 0.9–3.3)

## 2015-04-15 LAB — COMPREHENSIVE METABOLIC PANEL (CC13)
ALT: 29 U/L (ref 0–55)
AST: 25 U/L (ref 5–34)
Albumin: 3.6 g/dL (ref 3.5–5.0)
Alkaline Phosphatase: 63 U/L (ref 40–150)
Anion Gap: 6 mEq/L (ref 3–11)
BUN: 18.2 mg/dL (ref 7.0–26.0)
CO2: 30 mEq/L — ABNORMAL HIGH (ref 22–29)
CREATININE: 1.2 mg/dL — AB (ref 0.6–1.1)
Calcium: 8.9 mg/dL (ref 8.4–10.4)
Chloride: 104 mEq/L (ref 98–109)
EGFR: 45 mL/min/{1.73_m2} — ABNORMAL LOW (ref >90)
Glucose: 95 mg/dl (ref 70–140)
Potassium: 3.7 mEq/L (ref 3.5–5.1)
Sodium: 140 mEq/L (ref 136–145)
Total Bilirubin: 1.11 mg/dL (ref 0.20–1.20)
Total Protein: 6.8 g/dL (ref 6.4–8.3)

## 2015-04-15 NOTE — Progress Notes (Signed)
[place] [phone]   [phone] Multidisciplinary thoracic oncology clinic  CONSULT NOTE  REFERRING PHYSICIAN: Dr. [name]  REASON FOR CONSULTATION:  69 years old white female recently diagnosed with lung cancer.  HPI [name] is a 69 y.o. female was past medical history significant for anxiety, hypertension, hypothyroidism, dyslipidemia and remote history of smoking for only 10 years but quit in 1976. The patient was undergoing ultrasound of the abdomen on 08/18/2014 for evaluation of abdominal aorta and it showed 2 echogenic liver lesions concerning for hemangioma. Repeat ultrasound of the abdomen or 02/23/2015 showed stable echogenic foci within the gallbladder wall questionable for adenomyosis but there was also the hyperechoic foci within the liver and the right lower lobe lesion has mildly increased in size. Hepatic protocol MRI was recommended. This was performed on 03/10/2015 and it showed multiple bilateral pulmonary nodules with a masslike lesion in the left lower lobe. 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 There was a second tiny inferior right liver lesion measured only 7 mm and cannot be fully characterized. CT scan of the chest with contrast was performed on 03/16/2015 and it showed innumerable bilateral lung nodules throughout all lobes concerning for metastatic disease. The large dominant lesion is located in the superior segment of the left lower lobe and measured 2.7 x 2.7 x 3.0 cm. A PET scan was performed on 03/25/2015 and it showed diffuse pulmonary metastatic disease with a 4.0 cm dominant left lower lobe lung lesion. There was no enlarged hypermetabolic mediastinal or hilar adenopathy. There was no primary neoplasm identified in the neck, abdomen or pelvis. There was no findings for osseous metastatic disease. On 04/01/2015 the patient underwent CT-guided core biopsy of the dominant hypermetabolic  mass within the superior segment of the left lower lobe by interventional radiology. The final pathology (Accession: [id number]) was consistent with adenocarcinoma. The tumor is positive for cytokeratin 7 and TTF-1 while it is negative for cytokeratin 20, CDX-2 and estrogen receptor. The immunohistochemical staining pattern is consistent with an adenocarcinoma of lung primary source. The patient was referred to me today for evaluation and recommendation regarding her treatment options. When seen today she is feeling fine except for dry cough but no significant chest pain, shortness breath or hemoptysis. The patient denied having any significant weight loss or night sweats. The patient denied having any significant nausea or vomiting, no fever or chills. She has no headache or visual changes. Family history significant for mother with liver disease and father with ALS. Maternal grandfather had lung cancer, uncle with bone cancer and aunt with melanoma. The patient is married and has 2 sons. She was accompanied today by her husband Alvester Chou and her 2 sons, Luciana Axe and Hartley. The patient used to do office work. She has a history of smoking for around 10 years but quit in 1976. She has no history of alcohol or drug abuse.  HPI  Past Medical History  Diagnosis Date  . Hypertension   . Hyperlipidemia   . Thyroid disease   . Prediabetes   . Allergy   . Anxiety   . Insomnia     Past Surgical History  Procedure Laterality Date  . Tonsillectomy and adenoidectomy    . Abdominal hysterectomy  1995    w BSO    Family History  Problem Relation Age of Onset  . Liver disease Mother   . Alcohol abuse Mother   . ALS Father   . Hypertension  Father     Social History History  Substance Use Topics  . Smoking status: Former Smoker -- 0.50 packs/day for 10 years    Quit date: 10/23/1974  . Smokeless tobacco: Never Used  . Alcohol Use: No     Comment: Rare    Allergies  Allergen Reactions  .  Penicillins Swelling and Rash    Current Outpatient Prescriptions  Medication Sig Dispense Refill  . ALPRAZolam (XANAX) 1 MG tablet TAKE 1 TABLET BY MOUTH THREE TIMES DAILY 90 tablet 5  . aspirin 81 MG tablet Take 81 mg by mouth daily.    Marland Kitchen EST ESTROGENS-METHYLTEST HS 0.625-1.25 MG per tablet TAKE 1 TABLET BY MOUTH EVERY DAY 90 tablet 3  . levothyroxine (SYNTHROID, LEVOTHROID) 50 MCG tablet TAKE 1 TABLET BY MOUTH ONCE DAILY 90 tablet 99  . meloxicam (MOBIC) 15 MG tablet TAKE 1/2 TO 1 TABLET BY MOUTH DAILY WITH FOOD FOR PAIN AND INFLAMMATION 90 tablet 0  . montelukast (SINGULAIR) 10 MG tablet TAKE 1 TABLET BY MOUTH EVERY DAY 30 tablet 3  . simvastatin (ZOCOR) 20 MG tablet TAKE 1 TABLET BY MOUTH EVERY NIGHT AT BEDTIME 90 tablet 99  . triamterene-hydrochlorothiazide (MAXZIDE) 75-50 MG per tablet TAKE 1 TABLET BY MOUTH EVERY DAY 90 tablet 1   No current facility-administered medications for this visit.    Review of Systems  Constitutional: negative Eyes: negative Ears, nose, mouth, throat, and face: negative Respiratory: positive for cough Cardiovascular: negative Gastrointestinal: negative Genitourinary:negative Integument/breast: negative Hematologic/lymphatic: negative Musculoskeletal:negative Neurological: negative Behavioral/Psych: negative Endocrine: negative Allergic/Immunologic: negative  Physical Exam  KXF:GHWEX, healthy, no distress, well nourished and well developed SKIN: skin color, texture, turgor are normal, no rashes or significant lesions HEAD: Normocephalic, No masses, lesions, tenderness or abnormalities EYES: normal, PERRLA EARS: External ears normal, Canals clear OROPHARYNX:no exudate, no erythema and lips, buccal mucosa, and tongue normal  NECK: supple, no adenopathy, no JVD LYMPH:  no palpable lymphadenopathy, no hepatosplenomegaly BREAST:not examined LUNGS: clear to auscultation , and palpation HEART: regular rate & rhythm, no murmurs and no  gallops ABDOMEN:abdomen soft, non-tender, normal bowel sounds and no masses or organomegaly BACK: Back symmetric, no curvature., No CVA tenderness EXTREMITIES:no joint deformities, effusion, or inflammation, no edema, no skin discoloration  NEURO: alert & oriented x 3 with fluent speech, no focal motor/sensory deficits  PERFORMANCE STATUS: ECOG 1  LABORATORY DATA: Lab Results  Component Value Date   WBC 6.4 04/15/2015   HGB 13.7 04/15/2015   HCT 40.3 04/15/2015   MCV 86.1 04/15/2015   PLT 185 04/15/2015      Chemistry      Component Value Date/Time   NA 140 04/15/2015 1228   NA 139 02/25/2015 1146   K 3.7 04/15/2015 1228   K 3.7 02/25/2015 1146   CL 99 02/25/2015 1146   CO2 30* 04/15/2015 1228   CO2 30 02/25/2015 1146   BUN 18.2 04/15/2015 1228   BUN 19 02/25/2015 1146   CREATININE 1.2* 04/15/2015 1228   CREATININE 1.14* 02/25/2015 1146      Component Value Date/Time   CALCIUM 8.9 04/15/2015 1228   CALCIUM 9.3 02/25/2015 1146   ALKPHOS 63 04/15/2015 1228   ALKPHOS 53 02/25/2015 1146   AST 25 04/15/2015 1228   AST 22 02/25/2015 1146   ALT 29 04/15/2015 1228   ALT 23 02/25/2015 1146   BILITOT 1.11 04/15/2015 1228   BILITOT 1.2 02/25/2015 1146       RADIOGRAPHIC STUDIES: Dg Chest 1 View  04/01/2015  CLINICAL DATA:  Post biopsy  EXAM: CHEST  1 VIEW  COMPARISON:  07/31/2013  FINDINGS: Numerous ill-defined pulmonary nodules bilaterally, with a dominant nodule in the retrocardiac lung which has been recently biopsied. No evidence of significant surrounding alveolar hemorrhage. No pneumothorax or hemothorax noted. Normal heart size and mediastinal contours.  IMPRESSION: No pneumothorax or other acute finding after left lung biopsy.   Electronically Signed   By: Monte Fantasia M.D.   On: 04/01/2015 11:47   Nm Pet Image Initial (pi) Skull Base To Thigh  03/25/2015   CLINICAL DATA:  Initial treatment strategy for pulmonary nodules.  EXAM: NUCLEAR MEDICINE PET SKULL BASE TO  THIGH  TECHNIQUE: 8.4 mCi F-18 FDG was injected intravenously. Full-ring PET imaging was performed from the skull base to thigh after the radiotracer. CT data was obtained and used for attenuation correction and anatomic localization.  FASTING BLOOD GLUCOSE:  Value: 100 mg/dl  COMPARISON:  CT scan 03/16/2015  FINDINGS: NECK  No hypermetabolic lymph nodes in the neck.  CHEST  Innumerable bilateral pulmonary nodules are again demonstrated as on the recent CT scan. These are metabolically active.  18 mm right apical lesion on CT image 49 has SUV max of 5.6.  19.5 mm right lower lobe lesion on image number 61 has SUV max of 7.2.  Dominant 4.2 cm left lower lobe lesion has SUV max of 10.7.  No enlarged or hypermetabolic mediastinal or hilar lymph nodes suggest adenopathy.  ABDOMEN/PELVIS  No abnormal hypermetabolic activity within the liver, pancreas, adrenal glands, or spleen. No hypermetabolic lymph nodes in the abdomen or pelvis. Stable hepatic hemangioma. No obvious hepatic metastasis. I do not identify a definite primary neoplasm in the abdomen/ pelvis. The uterus and ovaries are surgically absent.  SKELETON  FDG activity noted along the right iliac crest laterally. There are some bony changes and I suspect this is due to a prior avulsion injury involving the gluteus medius muscle. I do not see an obvious mass or destructive bone lesion on the CT scan. There are few small scattered sclerotic bone lesions which are likely benign bone islands.  IMPRESSION: 1. Diffuse pulmonary metastatic disease with a 4 cm dominant left lower low lung lesion. No enlarged were hypermetabolic mediastinal or hilar adenopathy. 2. No primary neoplasm is identified in the neck, abdomen or pelvis. 3. No findings for osseous metastatic disease.   Electronically Signed   By: Marijo Sanes M.D.   On: 03/25/2015 09:33   Ct Biopsy  04/01/2015   INDICATION: No known primary, now with multiple hypermetabolic nodules/masses within both the right  and left lungs worrisome for metastatic disease. Please perform CT-guided biopsy for tissue diagnostic purposes  EXAM: CT BIOPSY OF DOMINANT HYPERMETABOLIC MASS WITHIN THE SUPERIOR SEGMENT OF THE LEFT LOWER LOBE  COMPARISON:  Chest CT- 03/16/2015; PET-CT - 03/25/2015  MEDICATIONS: None  ANESTHESIA/SEDATION: Conscious sedation was achieved with intravenous Versed and Fentanyl.  Sedation time  15 minutes  CONTRAST:  None  COMPLICATIONS: None immediate.  PROCEDURE: Informed consent was obtained from the patient following an explanation of the procedure, risks, benefits and alternatives. The patient understands,agrees and consents for the procedure. All questions were addressed. A time out was performed prior to the initiation of the procedure.  The patient was positioned left lateral decubitus on the CT table and a limited chest CT was performed for procedural planning demonstrating unchanged appearance of the dominant approximately 3.7 x 3.5 cm hypermetabolic mass within the superior segment of the left lower  lobe (image 15, series 2). The operative site was prepped and draped in the usual sterile fashion. Under sterile conditions and local anesthesia, a 17 gauge coaxial needle was advanced into the peripheral aspect of the nodule. Positioning was confirmed with intermittent CT fluoroscopy and followed by the acquisition of 5 core needle biopsies with an 18 gauge core needle biopsy device.  Limited post procedural chest CT was negative for pneumothorax or additional complication. The co-axial needle was removed and hemostasis was achieved with manual compression. A dressing was placed. The patient tolerated the procedure well without immediate postprocedural complication. The patient was escorted to have an upright chest radiograph.  IMPRESSION: Technically successful CT guided core needle core biopsy of dominant indeterminate hypermetabolic mass within the superior segment of the left lower lobe.   Electronically  Signed   By: Sandi Mariscal M.D.   On: 04/01/2015 11:51    ASSESSMENT: This is a very pleasant 69 years old white female recently diagnosed with stage IV (T2a, N0, M1 a) non-small cell lung cancer, adenocarcinoma presenting with a large dominant mass and the left lower lobe in addition to multiple pulmonary nodules bilaterally diagnosed in June 2016.  PLAN: I had a lengthy discussion with the patient and her family today about her current disease stage, prognosis and treatment options. I recommended for the patient to complete the staging workup by ordering a MRI of the brain to rule out brain metastasis. I also requested the tissue block to be sent to [facility] one for molecular biomarker testing to see if the patient has any actionable mutations that can benefit from target therapy. If the patient has no actionable mutation, I would consider her for systemic chemotherapy with platinum for AUC of 5 and Alimta 500 MG/M2 plus/minus Avastin 15 MG/KG every 3 weeks. I discussed with the patient adverse effect of the chemotherapy including but not limited to mild alopecia, myelosuppression, nausea and vomiting, peripheral neuropathy, liver or renal dysfunction. The patient is currently asymptomatic except for the dry cough. I gave her the option of proceeding with systemic chemotherapy now versus waiting until the molecular studies are available. Since the patient is mildly symptomatic, she agreed to wait until the molecular studies become available. She would come back for follow-up visit in 3 weeks for reevaluation and more detailed discussion of her treatment options based on the mutations status. She was advised to call immediately if she has any concerning symptoms in the interval. The patient was seen during the multidisciplinary thoracic oncology clinic today by medical oncology, thoracic navigator, social worker and physical therapist. The patient voices understanding of current disease status and  treatment options and is in agreement with the current care plan.  All questions were answered. The patient knows to call the clinic with any problems, questions or concerns. We can certainly see the patient much sooner if necessary.  Thank you so much for allowing me to participate in the care of Elizabeth [name]. I will continue to follow up the patient with you and assist in her care.  I spent 55 minutes counseling the patient face to face. The total time spent in the appointment was 80 minutes.  Disclaimer: This note was dictated with voice recognition software. Similar sounding words can inadvertently be transcribed and may not be corrected upon review.   Nikolette Reindl K. April 15, 2015, 1:45 PM

## 2015-04-15 NOTE — Progress Notes (Signed)
[place] Clinical Social Work  Clinical Social Work met with patient/family and Futures trader at [facility] appointment to offer support and assess for psychosocial needs.  Medical oncologist reviewed patient's diagnosis and recommended treatment plan with patient/family.  Elizabeth [name] was accompanied by spouse Elizabeth [name] and sons Elizabeth [name] and Elizabeth [name].  Elizabeth [name] and family were tearful after MD visit, CSW provided brief emotional support.    Clinical Social Work briefly discussed Clinical Social Work role and [employer] support programs/services.  Clinical Social Work encouraged patient to call with any additional questions or concerns.   Elizabeth [name], MSW, LCSW, OSW-C Clinical Social Worker [facility] [phone]

## 2015-04-15 NOTE — Progress Notes (Signed)
Oncology Nurse Navigator Documentation  Oncology Nurse Navigator Flowsheets 04/15/2015  Navigator Encounter Type Clinic/MDC  Patient Visit Type Initial  Treatment Phase Abnormal Scans  Barriers/Navigation Needs Education;Family concerns  Support Groups/Services Friends and Family  Time Spent with Patient 30         Thoracic Treatment Summary Name:Vernadine D Wickes Date:04/15/2015 DOB:[birth year] Your Medical Team Medical Oncologist:Dr. Mohamed    Type and Stage of Lung Cancer Non-Small Cell Carcinoma: Adenocarcinoma   Clinical Stage:  No matching staging information was found for the patient.   Clinical stage is based on radiology exams.  Pathological stage will be determined after surgery.  Staging is based on the size of the tumor, involvement of lymph nodes or not, and whether or not the cancer center has spread. Recommendations Recommendations: Chemotherapy  These recommendations are based on information available as of today's consult.  This is subject to change depending further testing or exams. Next Steps Next Step: Medical Oncology will set up follow up appointments  05/10/15 Follow up appointment with Dr. Julien Nordmann Barriers to Care What do you perceive as a potential barrier that may prevent you from receiving your treatment plan? Education-Information on lung cancer given and explained Support-resources at [employer] and [employer] given and explained   Resources Given: NCI Booklet on [employer] at [employer].Radonna Ricker [phone] Fall Risk Information  [employer] resources     Questions Norton Blizzard, RN BSN Thoracic Oncology Nurse Navigator at [place] is a nurse navigator that is available to assist you through your cancer journey.  She can answer your questions and/or provide resources regarding your treatment plan, emotional support, or financial concerns.

## 2015-04-15 NOTE — Telephone Encounter (Signed)
Gave and printed appt sched adn avs for pt for July

## 2015-04-15 NOTE — Therapy (Signed)
Elizabeth [name], [place], [address] Phone: [phone]   Fax:  [phone]  Physical Therapy Evaluation  Patient Details  Name: Elizabeth [name] MRN: [id number] Date of Birth: Nov 18, 1945 Referring Provider:  Curt Bears, MD  Encounter Date: 04/15/2015      PT End of Session - 04/15/15 1415    Visit Number 1   Number of Visits 1   PT Start Time 1751   PT Stop Time 1402   PT Time Calculation (min) 15 min   Activity Tolerance Patient tolerated treatment well   Behavior During Therapy [facility] for tasks assessed/performed      Past Medical History  Diagnosis Date  . Hypertension   . Hyperlipidemia   . Thyroid disease   . Prediabetes   . Allergy   . Anxiety   . Insomnia     Past Surgical History  Procedure Laterality Date  . Tonsillectomy and adenoidectomy    . Abdominal hysterectomy  1995    w BSO    There were no vitals filed for this visit.  Visit Diagnosis:  Physical deconditioning - Plan: PT plan of care cert/re-cert  History of joint pain - Plan: PT plan of care cert/re-cert      Subjective Assessment - 04/15/15 1404    Subjective No complaints.   Patient is accompained by: Family member  husband, two sons   Pertinent History Incidental finding of left lower lobe adenocarcinoma, stage IV, with bilateral pulmonary nodules, when being wored up for a liver lesion (which was benign).  Ex-smoker who quit 1976; prediabetic; anxiety; insomnia; vitamin D deficiency; hypthyroid; HTN controlled with diuretic; hyperlipidemia.   Currently in Pain? No/denies            [facility] PT Assessment - 04/15/15 0001    Assessment   Medical Diagnosis left lower lobe adenocarcinoma, stage IV, with bilateral pulmonary nodules   Onset Date/Surgical Date 03/16/15   Precautions   Precautions Other (comment)  cancer precautions   Restrictions   Weight Bearing Restrictions No   Balance Screen   Has the  patient fallen in the past 6 months No   Has the patient had a decrease in activity level because of a fear of falling?  No   Is the patient reluctant to leave their home because of a fear of falling?  No   Home Environment   Living Environment Private residence   Living Arrangements Spouse/significant other   Type of [place] to enter   [place] One level   Prior Function   Level of [place] used to do Silver Sneakers 3x/week until March, when left knee started to hurt; now is taking care of grandkids this summer   Observation/Other Assessments   Observations well-appearing woman in no acute distress surrounded by family members   Functional Tests   Functional tests Sit to Stand   Sit to Stand   Comments 14 times in 30 seconds   Posture/Postural Control   Posture/Postural Control No significant limitations   Posture Comments slight forward head   ROM / Strength   AROM / PROM / Strength AROM   AROM   Overall AROM Comments standing trunk AROM WFL   Ambulation/Gait   Ambulation/Gait Yes   Ambulation/Gait Assistance 7: Independent  per her report   Balance   Balance Assessed Yes   Dynamic Standing Balance   Dynamic Standing - Comments reaches 12 inches  forward in standing                           PT Education - April 26, 2015 1414    Education provided Yes   Education Details posture, breathing, walking or other cardio exercise, energy conservation, PT information   Person(s) Educated Patient;Spouse;Child(ren)   Methods Explanation;Handout   Comprehension Verbalized understanding               Lung Clinic Goals - Apr 26, 2015 1421    Patient will be able to verbalize understanding of the benefit of exercise to decrease fatigue.   Status Achieved   Patient will be able to verbalize the importance of posture.   Status Achieved   Patient will be able to demonstrate diaphragmatic breathing for improved  lung function.   Status Achieved   Patient will be able to verbalize understanding of the role of physical therapy to prevent functional decline and who to contact if physical therapy is needed.   Status Achieved             Plan - Apr 26, 2015 1415    Clinical Impression Statement Patient with asymptomatic status just diagnosed with adenocarcinoma of left lower lobe with multiple pulmonary nodules, stage IV.  Expected to have either targeted therapy for adenocarcinoma if she has genetic markers or chemotherapy.  Recent h/o left knee pain that limited activity.   Rehab Potential Good   PT Frequency One time visit   PT Treatment/Interventions Patient/family education   PT Next Visit Plan None at this time; pt. may benefit from therapy if endurance or fatigue become issues.   PT Home Exercise Plan see education section   Consulted and Agree with Plan of Care Patient;Family member/caregiver          G-Codes - 04/26/2015 1421    Functional Assessment Tool Used clinical judgement   Functional Limitation Mobility: Walking and moving around   Mobility: Walking and Moving Around Current Status [phone]) At least 1 percent but less than 20 percent impaired, limited or restricted   Mobility: Walking and Moving Around Goal Status [phone]) At least 1 percent but less than 20 percent impaired, limited or restricted   Mobility: Walking and Moving Around Discharge Status [phone]) At least 1 percent but less than 20 percent impaired, limited or restricted       Problem List Patient Active Problem List   Diagnosis Date Noted  . Adenocarcinoma, lung 04/05/2015  . Obesity 02/25/2015  . Medication management 11/03/2014  . Vitamin D deficiency 10/21/2013  . Hypothyroidism 10/21/2013  . Essential hypertension 10/21/2013  . Hyperlipidemia 10/21/2013  . Prediabetes   . Anxiety   . Insomnia     Elizabeth [name] 04-26-15, 2:23 PM  [place], [place], [address] Phone: [phone]   Fax:  [phone]    Patient will follow up at outpatient cancer rehab if needed.  If the patient does require further physical therapy, a specific plan will be dictated and sent to the referring physician for approval at that time. The patient was educated today on the importance of posture, diaphragmatic breathing, energy conservation, and a progressive walking program.    Serafina Royals, PT Apr 26, 2015 2:23 PM

## 2015-04-16 ENCOUNTER — Other Ambulatory Visit (HOSPITAL_COMMUNITY)
Admission: RE | Admit: 2015-04-16 | Discharge: 2015-04-16 | Disposition: A | Discharge status: Home / Self Care | Payer: Medicare Other | Source: Ambulatory Visit | Attending: Internal Medicine | Admitting: Internal Medicine

## 2015-04-16 DIAGNOSIS — Z87891 Personal history of nicotine dependence: Secondary | ICD-10-CM | POA: Diagnosis not present

## 2015-04-16 DIAGNOSIS — C3490 Malignant neoplasm of unspecified part of unspecified bronchus or lung: Secondary | ICD-10-CM | POA: Insufficient documentation

## 2015-04-19 ENCOUNTER — Telehealth: Payer: Self-pay | Admitting: *Deleted

## 2015-04-19 ENCOUNTER — Other Ambulatory Visit: Payer: Self-pay | Admitting: Internal Medicine

## 2015-04-19 NOTE — Telephone Encounter (Signed)
Oncology Nurse Navigator Documentation  Oncology Nurse Navigator Flowsheets 04/19/2015  Navigator Encounter Type Telephone/I called Ms. Wojtaszek to see if she had any questions or concerns from clinic.  She received a lot of information last week and I was just checking on her.  She stated she did not have any needs right now.    Treatment Phase Other  Barriers/Navigation Needs None  Support Groups/Services Navigator gave support  Time Spent with Patient 15

## 2015-04-22 ENCOUNTER — Ambulatory Visit (HOSPITAL_COMMUNITY)
Admission: RE | Admit: 2015-04-22 | Discharge: 2015-04-22 | Disposition: A | Discharge status: Home / Self Care | Payer: Medicare Other | Source: Ambulatory Visit | Attending: Internal Medicine | Admitting: Internal Medicine

## 2015-04-22 DIAGNOSIS — C3492 Malignant neoplasm of unspecified part of left bronchus or lung: Secondary | ICD-10-CM

## 2015-04-22 DIAGNOSIS — C3490 Malignant neoplasm of unspecified part of unspecified bronchus or lung: Secondary | ICD-10-CM | POA: Diagnosis present

## 2015-04-22 MED ORDER — GADOBENATE DIMEGLUMINE 529 MG/ML IV SOLN: 15.0000 mL | Freq: Once | INTRAVENOUS | Status: AC | PRN

## 2015-04-23 MED ORDER — GADOBENATE DIMEGLUMINE 529 MG/ML IV SOLN
15.0000 mL | Freq: Once | INTRAVENOUS | Status: AC | PRN
  Administered 2015-04-23: 15 mL via INTRAVENOUS

## 2015-05-04 ENCOUNTER — Encounter (HOSPITAL_COMMUNITY): Payer: Self-pay

## 2015-05-05 ENCOUNTER — Telehealth: Payer: Self-pay | Admitting: Internal Medicine

## 2015-05-05 ENCOUNTER — Telehealth: Payer: Self-pay | Admitting: *Deleted

## 2015-05-05 ENCOUNTER — Other Ambulatory Visit: Payer: Self-pay | Admitting: *Deleted

## 2015-05-05 ENCOUNTER — Encounter: Payer: Self-pay | Admitting: Internal Medicine

## 2015-05-05 ENCOUNTER — Other Ambulatory Visit (HOSPITAL_BASED_OUTPATIENT_CLINIC_OR_DEPARTMENT_OTHER): Payer: Medicare Other

## 2015-05-05 ENCOUNTER — Ambulatory Visit (HOSPITAL_BASED_OUTPATIENT_CLINIC_OR_DEPARTMENT_OTHER): Payer: Medicare Other | Admitting: Internal Medicine

## 2015-05-05 VITALS — BP 146/88 | HR 102 | Temp 98.1°F | Resp 18 | Ht 60.0 in | Wt 165.2 lb

## 2015-05-05 DIAGNOSIS — C3492 Malignant neoplasm of unspecified part of left bronchus or lung: Secondary | ICD-10-CM

## 2015-05-05 DIAGNOSIS — C3432 Malignant neoplasm of lower lobe, left bronchus or lung: Secondary | ICD-10-CM | POA: Diagnosis not present

## 2015-05-05 LAB — CBC WITH DIFFERENTIAL/PLATELET
BASO%: 0.7 % (ref 0.0–2.0)
Basophils Absolute: 0.1 10*3/uL (ref 0.0–0.1)
EOS%: 1.8 % (ref 0.0–7.0)
Eosinophils Absolute: 0.2 10*3/uL (ref 0.0–0.5)
HEMATOCRIT: 42.2 % (ref 34.8–46.6)
HGB: 14.1 g/dL (ref 11.6–15.9)
LYMPH%: 21 % (ref 14.0–49.7)
MCH: 28.5 pg (ref 25.1–34.0)
MCHC: 33.5 g/dL (ref 31.5–36.0)
MCV: 85.1 fL (ref 79.5–101.0)
MONO#: 0.7 10*3/uL (ref 0.1–0.9)
MONO%: 8.4 % (ref 0.0–14.0)
NEUT#: 6 10*3/uL (ref 1.5–6.5)
NEUT%: 68.1 % (ref 38.4–76.8)
Platelets: 183 10*3/uL (ref 145–400)
RBC: 4.96 10*6/uL (ref 3.70–5.45)
RDW: 12.9 % (ref 11.2–14.5)
WBC: 8.8 10*3/uL (ref 3.9–10.3)
lymph#: 1.8 10*3/uL (ref 0.9–3.3)

## 2015-05-05 LAB — COMPREHENSIVE METABOLIC PANEL (CC13)
ALK PHOS: 78 U/L (ref 40–150)
ALT: 43 U/L (ref 0–55)
AST: 33 U/L (ref 5–34)
Albumin: 3.9 g/dL (ref 3.5–5.0)
Anion Gap: 9 mEq/L (ref 3–11)
BILIRUBIN TOTAL: 1.06 mg/dL (ref 0.20–1.20)
BUN: 18.8 mg/dL (ref 7.0–26.0)
CHLORIDE: 103 meq/L (ref 98–109)
CO2: 27 meq/L (ref 22–29)
CREATININE: 1.1 mg/dL (ref 0.6–1.1)
Calcium: 9.6 mg/dL (ref 8.4–10.4)
EGFR: 54 mL/min/{1.73_m2} — AB (ref >90)
GLUCOSE: 114 mg/dL (ref 70–140)
Potassium: 4.3 mEq/L (ref 3.5–5.1)
Sodium: 139 mEq/L (ref 136–145)
Total Protein: 7.4 g/dL (ref 6.4–8.3)

## 2015-05-05 MED ORDER — AFATINIB DIMALEATE 40 MG PO TABS: 40.0000 mg | ORAL_TABLET | Freq: Every day | ORAL | Status: DC

## 2015-05-05 NOTE — Telephone Encounter (Signed)
pwer pof tos ch pt appt-gave pt copy of avs

## 2015-05-05 NOTE — Telephone Encounter (Signed)
Per pof added pt appt for today..per orders pof..the patient aware

## 2015-05-05 NOTE — Telephone Encounter (Signed)
Oncology Nurse Navigator Documentation  Oncology Nurse Navigator Flowsheets 05/05/2015  Navigator Encounter Type Telephone/per Dr. Julien Nordmann, I called patient to come and see him today at 4pm with labs at 3:45.  She verbalized understanding of appt  Treatment Phase Pre-treatment  Barriers/Navigation Needs No barriers at this time  Time Spent with Patient (Retired) 15

## 2015-05-05 NOTE — Progress Notes (Signed)
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  PRIOR THERAPY: None  CURRENT THERAPY: Gilotrif 40 mg by mouth daily. She is expected to start the first dose in a few days.  INTERVAL HISTORY: Elizabeth [name] 69 y.o. female returns to the clinic today for follow-up visit accompanied by her son. The patient is feeling fine today with no specific complaints except for mild shortness of breath. She denied having any significant chest pain, cough or hemoptysis. She has no significant weight loss or night sweats. She denied having any significant nausea or vomiting, no fever or chills. The molecular studies sent to [facility] one showed evidence for EGFR mutation with deletion in exon 19. The patient is here today for evaluation and discussion of her treatment options. She also had MRI of the brain performed recently that showed no evidence for metastatic disease to the brain.  MEDICAL HISTORY: Past Medical History  Diagnosis Date  . Hypertension   . Hyperlipidemia   . Thyroid disease   . Prediabetes   . Allergy   . Anxiety   . Insomnia     ALLERGIES:  is allergic to penicillins.  MEDICATIONS:  Current Outpatient Prescriptions  Medication Sig Dispense Refill  . ALPRAZolam (XANAX) 1 MG tablet TAKE 1 TABLET BY MOUTH THREE TIMES DAILY 90 tablet 5  . aspirin 81 MG tablet Take 81 mg by mouth daily.    Marland Kitchen EST ESTROGENS-METHYLTEST HS 0.625-1.25 MG per tablet TAKE 1 TABLET BY MOUTH EVERY DAY 90 tablet 3  . levothyroxine (SYNTHROID, LEVOTHROID) 50 MCG tablet TAKE 1 TABLET BY MOUTH ONCE DAILY 90  tablet 99  . meloxicam (MOBIC) 15 MG tablet TAKE 1/2 TO 1 TABLET BY MOUTH DAILY WITH FOOD FOR PAIN AND INFLAMMATION 90 tablet 0  . montelukast (SINGULAIR) 10 MG tablet TAKE 1 TABLET BY MOUTH EVERY DAY 30 tablet 3  . simvastatin (ZOCOR) 20 MG tablet TAKE 1 TABLET BY MOUTH EVERY NIGHT AT BEDTIME 90 tablet 99  . triamterene-hydrochlorothiazide (MAXZIDE) 75-50 MG per tablet TAKE 1 TABLET BY MOUTH EVERY DAY 90 tablet 1   No current facility-administered medications for this visit.    SURGICAL HISTORY:  Past Surgical History  Procedure Laterality Date  . Tonsillectomy and adenoidectomy    . Abdominal hysterectomy  1995    w BSO    REVIEW OF SYSTEMS:  Constitutional: negative Eyes: negative Ears, nose, mouth, throat, and face: negative Respiratory: positive for dyspnea on exertion Cardiovascular: negative Gastrointestinal: negative Genitourinary:negative Integument/breast: negative Hematologic/lymphatic: negative Musculoskeletal:negative Neurological: negative Behavioral/Psych: negative Endocrine: negative Allergic/Immunologic: negative   PHYSICAL EXAMINATION: General appearance: alert, cooperative and no distress Head: Normocephalic, without obvious abnormality, atraumatic Neck: no adenopathy, no JVD, supple, symmetrical, trachea midline and thyroid not enlarged, symmetric, no tenderness/mass/nodules Lymph nodes: Cervical, supraclavicular, and axillary nodes normal. Resp: clear to auscultation bilaterally Back: symmetric, no curvature. ROM normal. No CVA tenderness. Cardio: regular rate and rhythm, S1, S2 normal, no murmur, click, rub or gallop GI: soft, non-tender; bowel sounds normal; no masses,  no organomegaly Extremities: extremities normal, atraumatic, no cyanosis or edema Neurologic: Alert and oriented X 3, normal strength  and tone. Normal symmetric reflexes. Normal coordination and gait  ECOG PERFORMANCE STATUS: 0 - Asymptomatic  Blood pressure 146/88, pulse 102,  temperature 98.1 F (36.7 C), temperature source Oral, resp. rate 18, height 5' (1.524 m), weight 165 lb 3.2 oz (74.934 kg), SpO2 98 %.  LABORATORY DATA: Lab Results  Component Value Date   WBC 8.8 05/05/2015   HGB 14.1 05/05/2015   HCT 42.2 05/05/2015   MCV 85.1 05/05/2015   PLT 183 05/05/2015      Chemistry      Component Value Date/Time   NA 140 04/15/2015 1228   NA 139 02/25/2015 1146   K 3.7 04/15/2015 1228   K 3.7 02/25/2015 1146   CL 99 02/25/2015 1146   CO2 30* 04/15/2015 1228   CO2 30 02/25/2015 1146   BUN 18.2 04/15/2015 1228   BUN 19 02/25/2015 1146   CREATININE 1.2* 04/15/2015 1228   CREATININE 1.14* 02/25/2015 1146      Component Value Date/Time   CALCIUM 8.9 04/15/2015 1228   CALCIUM 9.3 02/25/2015 1146   ALKPHOS 63 04/15/2015 1228   ALKPHOS 53 02/25/2015 1146   AST 25 04/15/2015 1228   AST 22 02/25/2015 1146   ALT 29 04/15/2015 1228   ALT 23 02/25/2015 1146   BILITOT 1.11 04/15/2015 1228   BILITOT 1.2 02/25/2015 1146       RADIOGRAPHIC STUDIES: Mr Jeri Cos OQ Contrast  14-May-2015   CLINICAL DATA:  Adenocarcinoma of the lung.  EXAM: MRI HEAD WITHOUT AND WITH CONTRAST  TECHNIQUE: Multiplanar, multiecho pulse sequences of the brain and surrounding structures were obtained without and with intravenous contrast.  CONTRAST:  16 mL MultiHance.  COMPARISON:  None available.  FINDINGS: No acute infarct, hemorrhage, or mass lesion is present. The ventricles are of normal size. No significant extraaxial fluid collection is present.  Scattered subcortical T2 hyperintensities bilaterally are somewhat advanced for age. There is no associated enhancement. Linear enhancement in the right frontal lobe is compatible with a developmental venous anomaly. There is no cavernous malformation associated.  Flow is present in the major intracranial arteries. Globes orbits are intact. The paranasal sinuses and mastoid air cells are clear.  IMPRESSION: 1. No evidence for metastatic  disease of the brain or meninges. 2. Scattered subcortical T2 hyperintensities are somewhat advanced for age. The finding is nonspecific but can be seen in the setting of chronic microvascular ischemia, a demyelinating process such as multiple sclerosis, vasculitis, complicated migraine headaches, or as the sequelae of a prior infectious or inflammatory process. 3. No acute intracranial abnormality.   Electronically Signed   By: San Morelle M.D.   On: 05/14/15 15:19    ASSESSMENT AND PLAN: This is a very pleasant 69 years old white female recently diagnosed with a stage IV non-small cell lung cancer, adenocarcinoma with positive EGFR mutation with deletion in exon 19. Her recent MRI of the brain showed no evidence for disease metastasis. I had a lengthy discussion with the patient and her son about her current condition and treatment options. I recommended for the patient treatment with EGFR tyrosine kinase inhibitor. I recommended for her treatment with Gilotrif which showed survival benefit compared to chemotherapy for patient with EGFR mutation with deletion in exon 19. I would consider the patient for Charlett Nose a 40 mg by mouth daily. I discussed with her the adverse effect of this treatment including but not limited to skin rash, diarrhea, dry skin, paronychia, liver, renal dysfunction. I will send her prescription to  [facility] outpatient [facility]. I strongly advised the patient to keep Imodium with her at home for treatment of diarrhea as needed. She will also call if she has any significant skin rash or any other symptoms. The patient would come back for follow-up visit in 2 weeks for reevaluation and management of any adverse effect of her treatment. She was advised to call immediately if she has any concerning symptoms in the interval. The patient voices understanding of current disease status and treatment options and is in agreement with the current care plan.  All questions were  answered. The patient knows to call the clinic with any problems, questions or concerns. We can certainly see the patient much sooner if necessary.  I spent 20 minutes counseling the patient face to face. The total time spent in the appointment was 30 minutes.  Disclaimer: This note was dictated with voice recognition software. Similar sounding words can inadvertently be transcribed and may not be corrected upon review.

## 2015-05-06 ENCOUNTER — Encounter: Payer: Self-pay | Admitting: Internal Medicine

## 2015-05-06 NOTE — Progress Notes (Signed)
I faxed prior auth for gilotrif

## 2015-05-07 ENCOUNTER — Encounter: Payer: Self-pay | Admitting: Internal Medicine

## 2015-05-07 NOTE — Progress Notes (Signed)
Per [employer] gilotrif '40mg'$  tablet is approved 05/06/15-05/05/16 under part d; medicare. I sent to medical records.

## 2015-05-10 ENCOUNTER — Other Ambulatory Visit: Payer: Medicare Other

## 2015-05-10 ENCOUNTER — Ambulatory Visit: Payer: Medicare Other | Admitting: Internal Medicine

## 2015-05-13 ENCOUNTER — Telehealth: Payer: Self-pay | Admitting: *Deleted

## 2015-05-13 ENCOUNTER — Emergency Department (HOSPITAL_COMMUNITY): Payer: Medicare Other

## 2015-05-13 ENCOUNTER — Encounter (HOSPITAL_COMMUNITY): Payer: Self-pay | Admitting: *Deleted

## 2015-05-13 ENCOUNTER — Emergency Department (HOSPITAL_COMMUNITY)
Admission: EM | Admit: 2015-05-13 | Discharge: 2015-05-13 | Disposition: A | Discharge status: Home / Self Care | Payer: Medicare Other | Attending: Emergency Medicine | Admitting: Emergency Medicine

## 2015-05-13 DIAGNOSIS — R079 Chest pain, unspecified: Secondary | ICD-10-CM | POA: Diagnosis not present

## 2015-05-13 DIAGNOSIS — E785 Hyperlipidemia, unspecified: Secondary | ICD-10-CM | POA: Insufficient documentation

## 2015-05-13 DIAGNOSIS — Z8669 Personal history of other diseases of the nervous system and sense organs: Secondary | ICD-10-CM | POA: Diagnosis not present

## 2015-05-13 DIAGNOSIS — Z7982 Long term (current) use of aspirin: Secondary | ICD-10-CM | POA: Insufficient documentation

## 2015-05-13 DIAGNOSIS — R05 Cough: Secondary | ICD-10-CM | POA: Diagnosis not present

## 2015-05-13 DIAGNOSIS — F419 Anxiety disorder, unspecified: Secondary | ICD-10-CM | POA: Insufficient documentation

## 2015-05-13 DIAGNOSIS — Z880 Allergy status to penicillin: Secondary | ICD-10-CM | POA: Insufficient documentation

## 2015-05-13 DIAGNOSIS — Z79899 Other long term (current) drug therapy: Secondary | ICD-10-CM | POA: Diagnosis not present

## 2015-05-13 DIAGNOSIS — E079 Disorder of thyroid, unspecified: Secondary | ICD-10-CM | POA: Insufficient documentation

## 2015-05-13 DIAGNOSIS — Z87891 Personal history of nicotine dependence: Secondary | ICD-10-CM | POA: Diagnosis not present

## 2015-05-13 DIAGNOSIS — R0602 Shortness of breath: Secondary | ICD-10-CM | POA: Diagnosis not present

## 2015-05-13 DIAGNOSIS — I10 Essential (primary) hypertension: Secondary | ICD-10-CM | POA: Insufficient documentation

## 2015-05-13 HISTORY — DX: Malignant (primary) neoplasm, unspecified: C80.1

## 2015-05-13 LAB — BASIC METABOLIC PANEL
Anion gap: 12 (ref 5–15)
BUN: 17 mg/dL (ref 6–20)
CO2: 24 mmol/L (ref 22–32)
CREATININE: 1.17 mg/dL — AB (ref 0.44–1.00)
Calcium: 9.3 mg/dL (ref 8.9–10.3)
Chloride: 102 mmol/L (ref 101–111)
GFR calc Af Amer: 54 mL/min — ABNORMAL LOW (ref >60)
GFR calc non Af Amer: 47 mL/min — ABNORMAL LOW (ref >60)
Glucose, Bld: 91 mg/dL (ref 65–99)
Potassium: 3.7 mmol/L (ref 3.5–5.1)
Sodium: 138 mmol/L (ref 135–145)

## 2015-05-13 LAB — CBC
HEMATOCRIT: 40.1 % (ref 36.0–46.0)
Hemoglobin: 13.9 g/dL (ref 12.0–15.0)
MCH: 29.3 pg (ref 26.0–34.0)
MCHC: 34.7 g/dL (ref 30.0–36.0)
MCV: 84.6 fL (ref 78.0–100.0)
Platelets: 217 10*3/uL (ref 150–400)
RBC: 4.74 MIL/uL (ref 3.87–5.11)
RDW: 12.5 % (ref 11.5–15.5)
WBC: 10.4 10*3/uL (ref 4.0–10.5)

## 2015-05-13 LAB — I-STAT TROPONIN, ED: Troponin i, poc: 0 ng/mL (ref 0.00–0.08)

## 2015-05-13 LAB — TROPONIN I: Troponin I: <0.03 ng/mL (ref <0.031)

## 2015-05-13 MED ORDER — FENTANYL CITRATE (PF) 100 MCG/2ML IJ SOLN
75.0000 ug | Freq: Once | INTRAMUSCULAR | Status: AC
  Administered 2015-05-13: 75 ug via INTRAVENOUS
  Filled 2015-05-13: qty 2

## 2015-05-13 MED ORDER — SODIUM CHLORIDE 0.9 % IV BOLUS (SEPSIS)
1000.0000 mL | Freq: Once | INTRAVENOUS | Status: AC
  Administered 2015-05-13: 1000 mL via INTRAVENOUS

## 2015-05-13 MED ORDER — IOHEXOL 350 MG/ML SOLN
80.0000 mL | Freq: Once | INTRAVENOUS | Status: AC | PRN
  Administered 2015-05-13: 80 mL via INTRAVENOUS

## 2015-05-13 MED ORDER — HYDROCODONE-HOMATROPINE 5-1.5 MG/5ML PO SYRP: 5.0000 mL | ORAL_SOLUTION | Freq: Four times a day (QID) | ORAL | Status: DC | PRN

## 2015-05-13 NOTE — ED Notes (Signed)
UA at bedside if needed.Marland KitchenMarland KitchenMarland Kitchen

## 2015-05-13 NOTE — ED Notes (Signed)
Pt c/o intermittent left sided chest pain since last night. Pain started in mid chest now has moved to left chest. Pt reports shortness of breath with some mild nausea. Pt took aspirin at 1400 this afternoon. Pt has hx of stage 4 lung cancer.

## 2015-05-13 NOTE — ED Provider Notes (Signed)
CSN: [id number]     Arrival date & time 05/13/15  1831 History   First MD Initiated Contact with Patient 05/13/15 2022     Chief Complaint  Patient presents with  . Chest Pain     (Consider location/radiation/quality/duration/timing/severity/associated sxs/prior Treatment) Patient is a 69 y.o. female presenting with chest pain.  Chest Pain Pain location:  L chest and R chest Pain quality: aching and burning   Pain radiates to:  Does not radiate Pain radiates to the back: no   Pain severity:  Moderate Onset quality:  Gradual Duration:  2 days Timing:  Constant Progression:  Worsening Chronicity:  New Context: breathing   Relieved by:  None tried Worsened by:  Nothing tried Ineffective treatments:  None tried Associated symptoms: cough and shortness of breath   Associated symptoms: no abdominal pain, no fatigue, no fever, no PND and no syncope     Past Medical History  Diagnosis Date  . Hypertension   . Hyperlipidemia   . Thyroid disease   . Prediabetes   . Allergy   . Anxiety   . Insomnia    Past Surgical History  Procedure Laterality Date  . Tonsillectomy and adenoidectomy    . Abdominal hysterectomy  1995    w BSO   Family History  Problem Relation Age of Onset  . Liver disease Mother   . Alcohol abuse Mother   . ALS Father   . Hypertension Father    History  Substance Use Topics  . Smoking status: Former Smoker -- 0.50 packs/day for 10 years    Quit date: 10/23/1974  . Smokeless tobacco: Never Used  . Alcohol Use: No     Comment: Rare   OB History    No data available     Review of Systems  Constitutional: Negative for fever and fatigue.  Respiratory: Positive for cough and shortness of breath.   Cardiovascular: Positive for chest pain. Negative for syncope and PND.  Gastrointestinal: Negative for abdominal pain.  All other systems reviewed and are negative.     Allergies  Penicillins  Home Medications   Prior to Admission medications    Medication Sig Start Date End Date Taking? Authorizing Provider  afatinib dimaleate (GILOTRIF) 40 MG tablet Take 1 tablet (40 mg total) by mouth daily. Take on an empty stomach 1hr before or 2 hrs after meals. 05/05/15   Curt Bears, MD  ALPRAZolam Duanne Moron) 1 MG tablet TAKE 1 TABLET BY MOUTH THREE TIMES DAILY 12/07/14 06/07/15  [name], MD  aspirin 81 MG tablet Take 81 mg by mouth daily.    Historical Provider, MD  EST ESTROGENS-METHYLTEST HS 0.625-1.25 MG per tablet TAKE 1 TABLET BY MOUTH EVERY DAY 07/07/14   [name], MD  levothyroxine (SYNTHROID, LEVOTHROID) 50 MCG tablet TAKE 1 TABLET BY MOUTH ONCE DAILY 06/25/14 06/26/15  [name], MD  montelukast (SINGULAIR) 10 MG tablet TAKE 1 TABLET BY MOUTH EVERY DAY 04/19/15   Vicie Mutters, PA-C  simvastatin (ZOCOR) 20 MG tablet TAKE 1 TABLET BY MOUTH EVERY NIGHT AT BEDTIME 05/31/14 06/01/15  [name], MD  triamterene-hydrochlorothiazide (MAXZIDE) 75-50 MG per tablet TAKE 1 TABLET BY MOUTH EVERY DAY 02/07/15   [name], MD   BP 155/80 mmHg  Pulse 85  Temp(Src) 98 F (36.7 C) (Oral)  Resp 24  Ht '5\' 1"'$  (1.549 m)  Wt 163 lb (73.936 kg)  BMI 30.81 kg/m2  SpO2 98% Physical Exam  Constitutional: She is oriented to person, place, and time.  She appears well-developed and well-nourished.  HENT:  Head: Normocephalic and atraumatic.  Eyes: Conjunctivae and EOM are normal. Right eye exhibits no discharge. Left eye exhibits no discharge.  Cardiovascular: Normal rate and regular rhythm.   Pulmonary/Chest: Effort normal and breath sounds normal. No respiratory distress.  Abdominal: Soft. She exhibits no distension. There is no tenderness. There is no rebound.  Musculoskeletal: Normal range of motion. She exhibits no edema or tenderness.  Neurological: She is alert and oriented to person, place, and time.  Skin: Skin is warm and dry.  Nursing note and vitals reviewed.   ED Course  Procedures (including critical care  time) Labs Review Labs Reviewed  BASIC METABOLIC PANEL - Abnormal; Notable for the following:    Creatinine, Ser 1.17 (*)    GFR calc non Af Amer 47 (*)    GFR calc Af Amer 54 (*)    All other components within normal limits  CBC  TROPONIN I  I-STAT TROPOININ, ED    Imaging Review Dg Chest 2 View  05/13/2015   CLINICAL DATA:  Centralized and left-sided chest pain since yesterday evening. History of lung cancer.  EXAM: CHEST  2 VIEW  COMPARISON:  04/01/2015; 07/31/2013; chest CT - 03/16/2015; CT-guided left lower lobe pulmonary nodule biopsy - 04/01/2015  FINDINGS: Grossly unchanged cardiac silhouette and mediastinal contours. Extensive bilateral nodular opacities including dominant retrocardiac nodule/mass appear grossly unchanged. No new focal airspace opacities. There is persistent mild elevation of the right hemidiaphragm. No pleural effusion or pneumothorax. No evidence of edema. No acute osseus abnormalities.  IMPRESSION: Similar findings of extensive metastatic disease to the chest without acute cardiopulmonary disease.   Electronically Signed   By: Sandi Mariscal M.D.   On: 05/13/2015 19:22   Ct Angio Chest Pe W/cm &/or Wo Cm  05/13/2015   CLINICAL DATA:  Acute onset of shortness breath and generalized chest pain. Current history of stage IV lung cancer. Initial encounter.  EXAM: CT ANGIOGRAPHY CHEST WITH CONTRAST  TECHNIQUE: Multidetector CT imaging of the chest was performed using the standard protocol during bolus administration of intravenous contrast. Multiplanar CT image reconstructions and MIPs were obtained to evaluate the vascular anatomy.  CONTRAST:  64m OMNIPAQUE IOHEXOL 350 MG/ML SOLN  COMPARISON:  Chest radiograph performed earlier today at 7:04 p.m., and PET/CT performed 03/25/2015  FINDINGS: There is no evidence of pulmonary embolus.  Numerous bilateral pulmonary nodules are noted, with a dominant 5.3 cm mass at the left lung base. Mild underlying interstitial prominence is  noted, without definite evidence of lymphangitic spread of tumor. Findings are compatible with diffuse metastatic disease. The extent of metastatic disease is relatively stable from the prior PET/CT.  No superimposed focal airspace consolidation is seen. Mild interstitial prominence may reflect mild superimposed interstitial edema. No pleural effusion or pneumothorax is seen.  The mediastinum is unremarkable in appearance. No mediastinal lymphadenopathy is seen. No pericardial effusion is identified. The great vessels are grossly unremarkable. No axillary lymphadenopathy is seen. The visualized portions of the thyroid gland are unremarkable in appearance.  The visualized portions of the liver and spleen are unremarkable. A tiny hiatal hernia is noted. The visualized portions of the gallbladder, pancreas and adrenal glands are within normal limits.  No acute osseous abnormalities are seen.  Review of the MIP images confirms the above findings.  IMPRESSION: 1. No evidence of pulmonary embolus. 2. Mild interstitial prominence, suggestive of mild superimposed interstitial edema. 3. Numerous bilateral pulmonary nodules again noted, with a dominant 5.3 cm  mass at the left lung base. Findings compatible with diffuse metastatic disease, relatively stable from the recent prior PET/CT. 4. Tiny hiatal hernia noted.   Electronically Signed   By: Garald Balding M.D.   On: 05/13/2015 23:13     EKG Interpretation   Date/Time:  Thursday May 13 2015 18:38:01 EDT Ventricular Rate:  87 PR Interval:  158 QRS Duration: 60 QT Interval:  374 QTC Calculation: 450 R Axis:   26 Text Interpretation:  Normal sinus rhythm Possible Left atrial enlargement  Low voltage QRS Septal infarct , age undetermined Abnormal ECG no acute  ischemia.  QTc borderline long for female Confirmed by [facility] MD, Corene Cornea  [phone]) on 05/13/2015 10:14:07 PM      MDM   Final diagnoses:  Chest pain   69 yo F w/ stage IV lung cancer here with sob,  cp, cough c/w likely allergies, however with history of cancer tachycardia in triage and tachypnea with chest pain is high risk for pulmonary embolism so I will get a CTA to evaluate for that. First troponin was negative and her EKG without any evidence of ischemia and this is low risk for ischemia so I will get a second troponin 3 hours afterwards and have her follow-up with her doctor to continue that workup. Also possible she could have pneumonia however she hasn't had a fever or productive cough and this will show up on CT scan if it is present. I will treat her with some fluids and pain medication at this time to wait for results of CT scan and troponin for disposition.  Ct scan with extensive metastatic disease, reviewed and interpreted contemperaneously by myself, but no evidence of consolidation, PE, significant fluid overload. Symptoms likely 2/2 allergies and coughing, will treat accordingly.   I have personally and contemperaneously reviewed labs and imaging and used in my decision making as above.   I feel the patient has had an appropriate workup for their chief complaint at this time and likelihood of emergent condition existing is low. They have been counseled on decision, discharge, follow up and which symptoms necessitate immediate return to the emergency department. They or their family verbally stated understanding and agreement with plan and discharged in stable condition.      Merrily Pew, MD 05/14/15 [phone]

## 2015-05-13 NOTE — ED Notes (Signed)
Patient is alert and orientedx4.  Patient was explained discharge instructions and they understood them with no questions.  The patient's son, Elizabeth [name] is taking the patient home.

## 2015-05-13 NOTE — Telephone Encounter (Signed)
Oncology Nurse Navigator Documentation  Oncology Nurse Navigator Flowsheets 05/13/2015  Navigator Encounter Type Telephone/I called patient to see if she has started her oral biologic.  She stated she did on 05/11/15.  She did not have any trouble getting medication.   She did state she had some CP last night and was not sure what to do.  I explained if she had this feeling again to go to emergency room.  She verbalized understanding.   I asked that she call cancer center if needed.   Patient Visit Type Follow-up  Treatment Phase Treatment  Barriers/Navigation Needs Education  Education Understanding Cancer/ Treatment Options  Interventions Education Method  Time Spent with Patient 30

## 2015-05-13 NOTE — Telephone Encounter (Signed)
Oncology Nurse Navigator Documentation  Oncology Nurse Navigator Flowsheets 05/13/2015  Referral date to RadOnc/MedOnc -  Navigator Encounter Type Telephone/I called patient today to see if she has gotten her oral biologic medication and how she was doing with it.  I left her a vm message to call if needed   Treatment Phase Treatment  Barriers/Navigation Needs Education  Education Understanding Cancer/ Treatment Options  Interventions Education Method  Time Spent with Patient 15

## 2015-05-19 ENCOUNTER — Telehealth: Payer: Self-pay | Admitting: Internal Medicine

## 2015-05-19 ENCOUNTER — Ambulatory Visit (HOSPITAL_BASED_OUTPATIENT_CLINIC_OR_DEPARTMENT_OTHER): Payer: Medicare Other | Admitting: Physician Assistant

## 2015-05-19 ENCOUNTER — Other Ambulatory Visit (HOSPITAL_BASED_OUTPATIENT_CLINIC_OR_DEPARTMENT_OTHER): Payer: Medicare Other

## 2015-05-19 ENCOUNTER — Other Ambulatory Visit: Payer: Medicare Other

## 2015-05-19 ENCOUNTER — Encounter: Payer: Self-pay | Admitting: *Deleted

## 2015-05-19 ENCOUNTER — Encounter: Payer: Self-pay | Admitting: Physician Assistant

## 2015-05-19 ENCOUNTER — Encounter: Payer: Self-pay | Admitting: Medical Oncology

## 2015-05-19 VITALS — BP 144/80 | HR 81 | Temp 97.7°F | Resp 18 | Ht 61.0 in | Wt 164.9 lb

## 2015-05-19 DIAGNOSIS — C3432 Malignant neoplasm of lower lobe, left bronchus or lung: Secondary | ICD-10-CM

## 2015-05-19 DIAGNOSIS — C3490 Malignant neoplasm of unspecified part of unspecified bronchus or lung: Secondary | ICD-10-CM

## 2015-05-19 DIAGNOSIS — C7800 Secondary malignant neoplasm of unspecified lung: Secondary | ICD-10-CM | POA: Diagnosis not present

## 2015-05-19 DIAGNOSIS — C3492 Malignant neoplasm of unspecified part of left bronchus or lung: Secondary | ICD-10-CM

## 2015-05-19 LAB — COMPREHENSIVE METABOLIC PANEL (CC13)
ALT: 30 U/L (ref 0–55)
AST: 25 U/L (ref 5–34)
Albumin: 3.6 g/dL (ref 3.5–5.0)
Alkaline Phosphatase: 63 U/L (ref 40–150)
Anion Gap: 8 mEq/L (ref 3–11)
BUN: 15.5 mg/dL (ref 7.0–26.0)
CHLORIDE: 102 meq/L (ref 98–109)
CO2: 27 meq/L (ref 22–29)
Calcium: 8.8 mg/dL (ref 8.4–10.4)
Creatinine: 1.2 mg/dL — ABNORMAL HIGH (ref 0.6–1.1)
EGFR: 47 mL/min/{1.73_m2} — ABNORMAL LOW (ref >90)
GLUCOSE: 73 mg/dL (ref 70–140)
POTASSIUM: 3.7 meq/L (ref 3.5–5.1)
Sodium: 137 mEq/L (ref 136–145)
Total Bilirubin: 1.23 mg/dL — ABNORMAL HIGH (ref 0.20–1.20)
Total Protein: 6.8 g/dL (ref 6.4–8.3)

## 2015-05-19 LAB — CBC WITH DIFFERENTIAL/PLATELET
BASO%: 0.5 % (ref 0.0–2.0)
BASOS ABS: 0.1 10*3/uL (ref 0.0–0.1)
EOS%: 1.4 % (ref 0.0–7.0)
Eosinophils Absolute: 0.2 10*3/uL (ref 0.0–0.5)
HCT: 38.9 % (ref 34.8–46.6)
HGB: 13.2 g/dL (ref 11.6–15.9)
LYMPH#: 1.4 10*3/uL (ref 0.9–3.3)
LYMPH%: 12.3 % — ABNORMAL LOW (ref 14.0–49.7)
MCH: 28.8 pg (ref 25.1–34.0)
MCHC: 33.8 g/dL (ref 31.5–36.0)
MCV: 85.1 fL (ref 79.5–101.0)
MONO#: 1 10*3/uL — ABNORMAL HIGH (ref 0.1–0.9)
MONO%: 9.3 % (ref 0.0–14.0)
NEUT#: 8.6 10*3/uL — ABNORMAL HIGH (ref 1.5–6.5)
NEUT%: 76.5 % (ref 38.4–76.8)
Platelets: 194 10*3/uL (ref 145–400)
RBC: 4.57 10*6/uL (ref 3.70–5.45)
RDW: 13.2 % (ref 11.2–14.5)
WBC: 11.2 10*3/uL — ABNORMAL HIGH (ref 3.9–10.3)

## 2015-05-19 NOTE — Telephone Encounter (Signed)
gave and printd appt sched and avs fo rpt for AUG

## 2015-05-19 NOTE — Progress Notes (Addendum)
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  PRIOR THERAPY: None  CURRENT THERAPY: Gilotrif 40 mg by mouth daily. Treatment starting 05/11/2015.   INTERVAL HISTORY: Elizabeth [name] 69 y.o. female returns to the clinic today for follow-up visit. She reports starting the Gilotrif on 05/11/2015. She has been tolerating this medication relatively well. She notes looser than usual bowel movements, now having 2 bowel movements per day. She normally has an issue with constipation, having 1 difficult bowel movement daily. She notes a few skin eruptions on her chin and cheeks with no other significant rash. She reports that her mouth has been irritated and she is been using a mouthwash of Pepto-Bismol a children's strength any histamine and children strength ibuprofen which she has found helpful in the past. She reports going to the emergency room with viral symptoms and was prescribed hydrocodone cough syrup, the symptoms have resolved. She has a couple of fillings that need to be done. She sees Dr. Blenda Mounts who uses Nitrous gas. The patient is feeling fine today with no specific complaints except for mild shortness of breath. She denied having any significant chest pain, cough or hemoptysis. She has no significant weight loss or night sweats. She denied having any significant nausea or vomiting, no fever or chills.   MEDICAL HISTORY: Past Medical History  Diagnosis Date  . Hypertension   . Hyperlipidemia   . Thyroid disease   . Prediabetes   . Allergy   . Anxiety   . Insomnia   . Cancer       ALLERGIES:  is allergic to penicillins.  MEDICATIONS:  Current Outpatient Prescriptions  Medication Sig Dispense Refill  . afatinib dimaleate (GILOTRIF) 40 MG tablet Take 1 tablet (40 mg total) by mouth daily. Take on an empty stomach 1hr before or 2 hrs after meals. 30 tablet 2  . ALPRAZolam (XANAX) 1 MG tablet TAKE 1 TABLET BY MOUTH THREE TIMES DAILY 90 tablet 5  . aspirin 81 MG tablet Take 81 mg by mouth at bedtime.     . cetirizine (ZYRTEC) 10 MG tablet Take 10 mg by mouth daily as needed for allergies.    . EST ESTROGENS-METHYLTEST HS 0.625-1.25 MG per tablet TAKE 1 TABLET BY MOUTH EVERY DAY (Patient taking differently: TAKE 1 TABLET BY MOUTH THREE TIMES WEEKLY ON MON, WED, AND FRI) 90 tablet 3  . HYDROcodone-homatropine (HYCODAN) 5-1.5 MG/5ML syrup Take 5 mLs by mouth every 6 (six) hours as needed for cough. 120 mL 0  . levothyroxine (SYNTHROID, LEVOTHROID) 50 MCG tablet TAKE 1 TABLET BY MOUTH ONCE DAILY (Patient taking differently: TAKE 1 (5MCG) TABLET BY MOUTH THREE TIMES WEEKLY ON MON, WED, AND FRI AND TAKE 1 1/2 (75MCG) ON ALL OTHER DAYS) 90 tablet 99  . meloxicam (MOBIC) 15 MG tablet Take 15 mg by mouth daily as needed for pain.    . montelukast (SINGULAIR) 10 MG tablet TAKE 1 TABLET BY MOUTH EVERY DAY (Patient taking differently: TAKE 1 TABLET BY MOUTH DAILY AT BEDTIME) 30 tablet 3  . simvastatin (ZOCOR) 20 MG tablet TAKE 1 TABLET BY MOUTH EVERY NIGHT  AT BEDTIME 90 tablet 99  . triamterene-hydrochlorothiazide (MAXZIDE) 75-50 MG per tablet TAKE 1 TABLET BY MOUTH EVERY DAY 90 tablet 1   No current facility-administered medications for this visit.    SURGICAL HISTORY:  Past Surgical History  Procedure Laterality Date  . Tonsillectomy and adenoidectomy    . Abdominal hysterectomy  1995    w BSO    REVIEW OF SYSTEMS:  Constitutional: negative Eyes: negative Ears, nose, mouth, throat, and face: negative Respiratory: positive for dyspnea on exertion Cardiovascular:  negative Gastrointestinal: negative Genitourinary:negative Integument/breast: positive for a few small erythematous acneform eruptions on the chin and cheeks Hematologic/lymphatic: negative Musculoskeletal:negative Neurological: negative Behavioral/Psych: negative Endocrine: negative Allergic/Immunologic: negative   PHYSICAL EXAMINATION: General appearance: alert, cooperative and no distress Head: Normocephalic, without obvious abnormality, atraumatic Neck: no adenopathy, no JVD, supple, symmetrical, trachea midline and thyroid not enlarged, symmetric, no tenderness/mass/nodules Lymph nodes: Cervical, supraclavicular, and axillary nodes normal. Resp: clear to auscultation bilaterally Back: symmetric, no curvature. ROM normal. No CVA tenderness. Cardio: regular rate and rhythm, S1, S2 normal, no murmur, click, rub or gallop GI: soft, non-tender; bowel sounds normal; no masses,  no organomegaly Extremities: extremities normal, atraumatic, no cyanosis or edema Neurologic: Alert and oriented X 3, normal strength and tone. Normal symmetric reflexes. Normal coordination and gait Skin: scant erythematous acneform eruptions on the chin and cheeks, no evidence of superinfection.  ECOG PERFORMANCE STATUS: 0 - Asymptomatic  Blood pressure 144/80, pulse 81, temperature 97.7 F (36.5 C), temperature source Oral, resp. rate 18, height 5' 1" (1.549 m), weight 164 lb 14.4 oz (74.798 kg), SpO2 100 %.  LABORATORY DATA: Lab Results  Component Value Date   WBC 11.2* 05/19/2015   HGB 13.2 05/19/2015   HCT 38.9 05/19/2015   MCV 85.1 05/19/2015   PLT 194 05/19/2015      Chemistry      Component Value Date/Time   NA 137 05/19/2015 1142   NA 138 05/13/2015 1855   K 3.7 05/19/2015 1142   K 3.7 05/13/2015 1855   CL 102 05/13/2015 1855   CO2 27 05/19/2015 1142   CO2 24 05/13/2015 1855   BUN 15.5 05/19/2015 1142   BUN 17 05/13/2015 1855   CREATININE 1.2* 05/19/2015 1142   CREATININE 1.17*  05/13/2015 1855   CREATININE 1.14* 02/25/2015 1146      Component Value Date/Time   CALCIUM 8.8 05/19/2015 1142   CALCIUM 9.3 05/13/2015 1855   ALKPHOS 63 05/19/2015 1142   ALKPHOS 53 02/25/2015 1146   AST 25 05/19/2015 1142   AST 22 02/25/2015 1146   ALT 30 05/19/2015 1142   ALT 23 02/25/2015 1146   BILITOT 1.23* 05/19/2015 1142   BILITOT 1.2 02/25/2015 1146       RADIOGRAPHIC STUDIES: Dg Chest 2 View  05/13/2015   CLINICAL DATA:  Centralized and left-sided chest pain since yesterday evening. History of lung cancer.  EXAM: CHEST  2 VIEW  COMPARISON:  04/01/2015; 07/31/2013; chest CT - 03/16/2015; CT-guided left lower lobe pulmonary nodule biopsy - 04/01/2015  FINDINGS: Grossly unchanged cardiac silhouette and mediastinal contours. Extensive bilateral nodular opacities including dominant retrocardiac nodule/mass appear grossly unchanged. No new focal airspace opacities. There is persistent mild elevation of the right hemidiaphragm. No pleural effusion or pneumothorax. No evidence of edema. No acute osseus abnormalities.  IMPRESSION: Similar findings of extensive metastatic disease to the chest without acute cardiopulmonary disease.   Electronically Signed   By: Sandi Mariscal M.D.   On: 05/13/2015 19:22  Ct Angio Chest Pe W/cm &/or Wo Cm  05/13/2015   CLINICAL DATA:  Acute onset of shortness breath and generalized chest pain. Current history of stage IV lung cancer. Initial encounter.  EXAM: CT ANGIOGRAPHY CHEST WITH CONTRAST  TECHNIQUE: Multidetector CT imaging of the chest was performed using the standard protocol during bolus administration of intravenous contrast. Multiplanar CT image reconstructions and MIPs were obtained to evaluate the vascular anatomy.  CONTRAST:  74m OMNIPAQUE IOHEXOL 350 MG/ML SOLN  COMPARISON:  Chest radiograph performed earlier today at 7:04 p.m., and PET/CT performed 03/25/2015  FINDINGS: There is no evidence of pulmonary embolus.  Numerous bilateral pulmonary  nodules are noted, with a dominant 5.3 cm mass at the left lung base. Mild underlying interstitial prominence is noted, without definite evidence of lymphangitic spread of tumor. Findings are compatible with diffuse metastatic disease. The extent of metastatic disease is relatively stable from the prior PET/CT.  No superimposed focal airspace consolidation is seen. Mild interstitial prominence may reflect mild superimposed interstitial edema. No pleural effusion or pneumothorax is seen.  The mediastinum is unremarkable in appearance. No mediastinal lymphadenopathy is seen. No pericardial effusion is identified. The great vessels are grossly unremarkable. No axillary lymphadenopathy is seen. The visualized portions of the thyroid gland are unremarkable in appearance.  The visualized portions of the liver and spleen are unremarkable. A tiny hiatal hernia is noted. The visualized portions of the gallbladder, pancreas and adrenal glands are within normal limits.  No acute osseous abnormalities are seen.  Review of the MIP images confirms the above findings.  IMPRESSION: 1. No evidence of pulmonary embolus. 2. Mild interstitial prominence, suggestive of mild superimposed interstitial edema. 3. Numerous bilateral pulmonary nodules again noted, with a dominant 5.3 cm mass at the left lung base. Findings compatible with diffuse metastatic disease, relatively stable from the recent prior PET/CT. 4. Tiny hiatal hernia noted.   Electronically Signed   By: JGarald BaldingM.D.   On: 05/13/2015 23:13   Mr BJeri CosWXKContrast  04/22/2015   CLINICAL DATA:  Adenocarcinoma of the lung.  EXAM: MRI HEAD WITHOUT AND WITH CONTRAST  TECHNIQUE: Multiplanar, multiecho pulse sequences of the brain and surrounding structures were obtained without and with intravenous contrast.  CONTRAST:  16 mL MultiHance.  COMPARISON:  None available.  FINDINGS: No acute infarct, hemorrhage, or mass lesion is present. The ventricles are of normal size. No  significant extraaxial fluid collection is present.  Scattered subcortical T2 hyperintensities bilaterally are somewhat advanced for age. There is no associated enhancement. Linear enhancement in the right frontal lobe is compatible with a developmental venous anomaly. There is no cavernous malformation associated.  Flow is present in the major intracranial arteries. Globes orbits are intact. The paranasal sinuses and mastoid air cells are clear.  IMPRESSION: 1. No evidence for metastatic disease of the brain or meninges. 2. Scattered subcortical T2 hyperintensities are somewhat advanced for age. The finding is nonspecific but can be seen in the setting of chronic microvascular ischemia, a demyelinating process such as multiple sclerosis, vasculitis, complicated migraine headaches, or as the sequelae of a prior infectious or inflammatory process. 3. No acute intracranial abnormality.   Electronically Signed   By: CSan MorelleM.D.   On: 04/22/2015 15:19    ASSESSMENT AND PLAN: This is a very pleasant 69years old white female recently diagnosed with a stage IV non-small cell lung cancer, adenocarcinoma with positive EGFR mutation with deletion in exon 19. Her recent MRI of the  brain showed no evidence for disease metastasis. She is currently being treated with Gilotrif 40 mg by mouth daily. Treatment beginning 05/11/2015. Overall she's tolerating this medication relatively well. Patient was discussed with and also seen by Dr. Julien Nordmann. She will continue on the Gilotrif at the current dose. She'll follow-up in 2 weeks for another symptom management visit. She is advised to follow-up with her dentist to have her cavities taking care of so that she does not develop any possible closed space infections.  She is again advised to keep Imodium with her at home for treatment of diarrhea as needed. She will also call if she has any significant skin rash or any other symptoms. She was advised to call immediately  if she has any concerning symptoms in the interval. The patient voices understanding of current disease status and treatment options and is in agreement with the current care plan.  All questions were answered. The patient knows to call the clinic with any problems, questions or concerns. We can certainly see the patient much sooner if necessary.  Carlton Adam, PA-C 05/19/2015  ADDENDUM: Hematology/Oncology Attending: I had a face to face encounter with the patient. I recommended her care plan. This is a very pleasant 69 years old white female recently diagnosed with stage IV non-small cell lung cancer, adenocarcinoma with positive EGFR mutation with deletion in exon 19. The patient was started recently on treatment with oral Gilotrif. She status post 2 weeks of treatment and tolerating it fairly well was no significant skin rash or diarrhea. She was advised to keep Imodium available with her also time for management of diarrhea. The patient will continue her current treatment with Gilotrif. She would come back for follow-up visit in 2 weeks for reevaluation and management of any adverse effect of her treatment. She was advised to call immediately if she has any concerning symptoms in the interval.   Disclaimer: This note was dictated with voice recognition software. Similar sounding words can inadvertently be transcribed and may be missed upon review. Eilleen Kempf., MD 05/24/2015

## 2015-05-22 NOTE — Patient Instructions (Signed)
Continue Gilotrif 40 mg by mouth daily Follow up in 2 weeks 

## 2015-05-24 ENCOUNTER — Telehealth: Payer: Self-pay | Admitting: Medical Oncology

## 2015-05-24 ENCOUNTER — Other Ambulatory Visit: Payer: Self-pay | Admitting: Medical Oncology

## 2015-05-24 DIAGNOSIS — T451X5A Adverse effect of antineoplastic and immunosuppressive drugs, initial encounter: Principal | ICD-10-CM

## 2015-05-24 DIAGNOSIS — R112 Nausea with vomiting, unspecified: Secondary | ICD-10-CM

## 2015-05-24 MED ORDER — ONDANSETRON HCL 8 MG PO TABS: 8.0000 mg | ORAL_TABLET | Freq: Three times a day (TID) | ORAL | Status: DC | PRN

## 2015-05-24 NOTE — Telephone Encounter (Signed)
Per Awilda Metro I called in zofran and encouraged fluids and to call back in am if no better.

## 2015-05-24 NOTE — Telephone Encounter (Signed)
On gilotrif . Since Friday she is nauseated and started vomiting today x1. Had 8 oz of fluid today. She has diarrhea controlled with imodium. Mouth sores present. Requests Magic mouth wash and non drowsy antiemetic.

## 2015-06-03 ENCOUNTER — Encounter: Payer: Self-pay | Admitting: Physician Assistant

## 2015-06-03 ENCOUNTER — Other Ambulatory Visit (HOSPITAL_BASED_OUTPATIENT_CLINIC_OR_DEPARTMENT_OTHER): Payer: Medicare Other

## 2015-06-03 ENCOUNTER — Telehealth: Payer: Self-pay | Admitting: Internal Medicine

## 2015-06-03 ENCOUNTER — Ambulatory Visit (HOSPITAL_BASED_OUTPATIENT_CLINIC_OR_DEPARTMENT_OTHER): Payer: Medicare Other | Admitting: Physician Assistant

## 2015-06-03 VITALS — BP 133/76 | HR 90 | Temp 98.1°F | Resp 18 | Ht 61.0 in | Wt 161.2 lb

## 2015-06-03 DIAGNOSIS — R197 Diarrhea, unspecified: Secondary | ICD-10-CM | POA: Diagnosis not present

## 2015-06-03 DIAGNOSIS — C3490 Malignant neoplasm of unspecified part of unspecified bronchus or lung: Secondary | ICD-10-CM

## 2015-06-03 DIAGNOSIS — C3432 Malignant neoplasm of lower lobe, left bronchus or lung: Secondary | ICD-10-CM

## 2015-06-03 DIAGNOSIS — R21 Rash and other nonspecific skin eruption: Secondary | ICD-10-CM

## 2015-06-03 DIAGNOSIS — C3492 Malignant neoplasm of unspecified part of left bronchus or lung: Secondary | ICD-10-CM

## 2015-06-03 LAB — COMPREHENSIVE METABOLIC PANEL (CC13)
ALT: 73 U/L — ABNORMAL HIGH (ref 0–55)
ANION GAP: 8 meq/L (ref 3–11)
AST: 29 U/L (ref 5–34)
Albumin: 3.3 g/dL — ABNORMAL LOW (ref 3.5–5.0)
Alkaline Phosphatase: 122 U/L (ref 40–150)
BILIRUBIN TOTAL: 1.11 mg/dL (ref 0.20–1.20)
BUN: 14.8 mg/dL (ref 7.0–26.0)
CALCIUM: 8.6 mg/dL (ref 8.4–10.4)
CO2: 28 meq/L (ref 22–29)
CREATININE: 1.1 mg/dL (ref 0.6–1.1)
Chloride: 104 mEq/L (ref 98–109)
EGFR: 53 mL/min/{1.73_m2} — ABNORMAL LOW (ref >90)
Glucose: 102 mg/dl (ref 70–140)
Potassium: 3.4 mEq/L — ABNORMAL LOW (ref 3.5–5.1)
Sodium: 140 mEq/L (ref 136–145)
Total Protein: 6.4 g/dL (ref 6.4–8.3)

## 2015-06-03 LAB — CBC WITH DIFFERENTIAL/PLATELET
BASO%: 0.6 % (ref 0.0–2.0)
BASOS ABS: 0.1 10*3/uL (ref 0.0–0.1)
EOS ABS: 1.2 10*3/uL — AB (ref 0.0–0.5)
EOS%: 10 % — AB (ref 0.0–7.0)
HCT: 40 % (ref 34.8–46.6)
HEMOGLOBIN: 13.6 g/dL (ref 11.6–15.9)
LYMPH%: 11.8 % — ABNORMAL LOW (ref 14.0–49.7)
MCH: 28.8 pg (ref 25.1–34.0)
MCHC: 34.1 g/dL (ref 31.5–36.0)
MCV: 84.5 fL (ref 79.5–101.0)
MONO#: 0.9 10*3/uL (ref 0.1–0.9)
MONO%: 8.1 % (ref 0.0–14.0)
NEUT#: 8.1 10*3/uL — ABNORMAL HIGH (ref 1.5–6.5)
NEUT%: 69.5 % (ref 38.4–76.8)
Platelets: 198 10*3/uL (ref 145–400)
RBC: 4.73 10*6/uL (ref 3.70–5.45)
RDW: 13.2 % (ref 11.2–14.5)
WBC: 11.6 10*3/uL — ABNORMAL HIGH (ref 3.9–10.3)
lymph#: 1.4 10*3/uL (ref 0.9–3.3)

## 2015-06-03 LAB — RESEARCH LABS

## 2015-06-03 MED ORDER — CLINDAMYCIN PHOSPHATE 1 % EX SOLN: Freq: Two times a day (BID) | CUTANEOUS | Status: DC

## 2015-06-03 NOTE — Progress Notes (Addendum)
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  PRIOR THERAPY: None  CURRENT THERAPY: Gilotrif 40 mg by mouth daily. Treatment starting 05/11/2015.   INTERVAL HISTORY: Elizabeth [name] 69 y.o. female returns to the clinic today for follow-up visit. She reports starting the Gilotrif on 05/11/2015. She is now status post proximal May 3 weeks of therapy. She has been tolerating this medication relatively well. She has had 2 days of diarrhea having 6 episodes per day. She was taking Imodium but not as directed. She's developed a rash primarily on her face and neck with some scalp itching without specific lesions.  The patient is feeling fine today with no specific complaints except for mild shortness of breath. She denied having any significant chest pain, cough or hemoptysis. She has no significant weight loss or night sweats. She denied having any significant nausea or vomiting, no fever or chills.   MEDICAL HISTORY: Past Medical History  Diagnosis Date  . Hypertension   . Hyperlipidemia   . Thyroid disease   . Prediabetes   . Allergy   . Anxiety   . Insomnia   . Cancer     ALLERGIES:  is allergic to penicillins.  MEDICATIONS:  Current Outpatient Prescriptions  Medication Sig Dispense Refill  . afatinib dimaleate (GILOTRIF) 40 MG tablet Take 1 tablet (40 mg total) by mouth daily. Take on an empty stomach 1hr before or 2 hrs after meals. 30 tablet 2  . ALPRAZolam (XANAX) 1 MG tablet TAKE 1 TABLET BY MOUTH THREE TIMES DAILY 90 tablet 5  . aspirin 81 MG tablet Take 81 mg by mouth at  bedtime.     . cetirizine (ZYRTEC) 10 MG tablet Take 10 mg by mouth daily as needed for allergies.    . EST ESTROGENS-METHYLTEST HS 0.625-1.25 MG per tablet TAKE 1 TABLET BY MOUTH EVERY DAY (Patient taking differently: TAKE 1 TABLET BY MOUTH THREE TIMES WEEKLY ON MON, WED, AND FRI) 90 tablet 3  . HYDROcodone-homatropine (HYCODAN) 5-1.5 MG/5ML syrup Take 5 mLs by mouth every 6 (six) hours as needed for cough. 120 mL 0  . levothyroxine (SYNTHROID, LEVOTHROID) 50 MCG tablet TAKE 1 TABLET BY MOUTH ONCE DAILY (Patient taking differently: TAKE 1 (5MCG) TABLET BY MOUTH THREE TIMES WEEKLY ON MON, WED, AND FRI AND TAKE 1 1/2 (75MCG) ON ALL OTHER DAYS) 90 tablet 99  . meloxicam (MOBIC) 15 MG tablet Take 15 mg by mouth daily as needed for pain.    . montelukast (SINGULAIR) 10 MG tablet TAKE 1 TABLET BY MOUTH EVERY DAY (Patient taking differently: TAKE 1 TABLET BY MOUTH DAILY AT BEDTIME) 30 tablet 3  . ondansetron (ZOFRAN) 8 MG tablet Take 1 tablet (8 mg total) by mouth every 8 (eight) hours as needed for nausea or vomiting. 20 tablet 0  . triamterene-hydrochlorothiazide (MAXZIDE) 75-50 MG per tablet TAKE 1 TABLET BY MOUTH EVERY DAY 90 tablet 1  . clindamycin (CLEOCIN-T) 1 % external solution Apply topically 2 (two) times daily. To affected areas 30 mL 1  . simvastatin (ZOCOR) 20 MG tablet TAKE 1 TABLET BY MOUTH EVERY NIGHT AT BEDTIME 90  tablet 99   No current facility-administered medications for this visit.    SURGICAL HISTORY:  Past Surgical History  Procedure Laterality Date  . Tonsillectomy and adenoidectomy    . Abdominal hysterectomy  1995    w BSO    REVIEW OF SYSTEMS:  Constitutional: negative Eyes: negative Ears, nose, mouth, throat, and face: negative Respiratory: positive for dyspnea on exertion Cardiovascular: negative Gastrointestinal: negative Genitourinary:negative Integument/breast: positive for a few small erythematous acneform eruptions on the chin and  cheeks Hematologic/lymphatic: negative Musculoskeletal:negative Neurological: negative Behavioral/Psych: negative Endocrine: negative Allergic/Immunologic: negative   PHYSICAL EXAMINATION: General appearance: alert, cooperative and no distress Head: Normocephalic, without obvious abnormality, atraumatic Neck: no adenopathy, no JVD, supple, symmetrical, trachea midline and thyroid not enlarged, symmetric, no tenderness/mass/nodules Lymph nodes: Cervical, supraclavicular, and axillary nodes normal. Resp: clear to auscultation bilaterally Back: symmetric, no curvature. ROM normal. No CVA tenderness. Cardio: regular rate and rhythm, S1, S2 normal, no murmur, click, rub or gallop GI: soft, non-tender; bowel sounds normal; no masses,  no organomegaly Extremities: extremities normal, atraumatic, no cyanosis or edema Neurologic: Alert and oriented X 3, normal strength and tone. Normal symmetric reflexes. Normal coordination and gait Skin: erythematous acneform eruptions on the face and neck, to a lesser extent on the anterior chest, no evidence of superinfection.  ECOG PERFORMANCE STATUS: 0 - Asymptomatic  Blood pressure 133/76, pulse 90, temperature 98.1 F (36.7 C), temperature source Oral, resp. rate 18, height 5' 1" (1.549 m), weight 161 lb 3.2 oz (73.12 kg), SpO2 97 %.  LABORATORY DATA: Lab Results  Component Value Date   WBC 11.6* 06/03/2015   HGB 13.6 06/03/2015   HCT 40.0 06/03/2015   MCV 84.5 06/03/2015   PLT 198 06/03/2015      Chemistry      Component Value Date/Time   NA 140 06/03/2015 1334   NA 138 05/13/2015 1855   K 3.4* 06/03/2015 1334   K 3.7 05/13/2015 1855   CL 102 05/13/2015 1855   CO2 28 06/03/2015 1334   CO2 24 05/13/2015 1855   BUN 14.8 06/03/2015 1334   BUN 17 05/13/2015 1855   CREATININE 1.1 06/03/2015 1334   CREATININE 1.17* 05/13/2015 1855   CREATININE 1.14* 02/25/2015 1146      Component Value Date/Time   CALCIUM 8.6 06/03/2015 1334   CALCIUM  9.3 05/13/2015 1855   ALKPHOS 122 06/03/2015 1334   ALKPHOS 53 02/25/2015 1146   AST 29 06/03/2015 1334   AST 22 02/25/2015 1146   ALT 73* 06/03/2015 1334   ALT 23 02/25/2015 1146   BILITOT 1.11 06/03/2015 1334   BILITOT 1.2 02/25/2015 1146       RADIOGRAPHIC STUDIES: Dg Chest 2 View  05/13/2015   CLINICAL DATA:  Centralized and left-sided chest pain since yesterday evening. History of lung cancer.  EXAM: CHEST  2 VIEW  COMPARISON:  04/01/2015; 07/31/2013; chest CT - 03/16/2015; CT-guided left lower lobe pulmonary nodule biopsy - 04/01/2015  FINDINGS: Grossly unchanged cardiac silhouette and mediastinal contours. Extensive bilateral nodular opacities including dominant retrocardiac nodule/mass appear grossly unchanged. No new focal airspace opacities. There is persistent mild elevation of the right hemidiaphragm. No pleural effusion or pneumothorax. No evidence of edema. No acute osseus abnormalities.  IMPRESSION: Similar findings of extensive metastatic disease to the chest without acute cardiopulmonary disease.   Electronically Signed   By: Sandi Mariscal M.D.   On: 05/13/2015 19:22   Ct Angio Chest Pe W/cm &/or Wo Cm  05/13/2015   CLINICAL DATA:  Acute onset of shortness breath and generalized chest pain. Current history of stage IV lung cancer. Initial encounter.  EXAM: CT ANGIOGRAPHY CHEST WITH CONTRAST  TECHNIQUE: Multidetector CT imaging of the chest was performed using the standard protocol during bolus administration of intravenous contrast. Multiplanar CT image reconstructions and MIPs were obtained to evaluate the vascular anatomy.  CONTRAST:  37m OMNIPAQUE IOHEXOL 350 MG/ML SOLN  COMPARISON:  Chest radiograph performed earlier today at 7:04 p.m., and PET/CT performed 03/25/2015  FINDINGS: There is no evidence of pulmonary embolus.  Numerous bilateral pulmonary nodules are noted, with a dominant 5.3 cm mass at the left lung base. Mild underlying interstitial prominence is noted, without  definite evidence of lymphangitic spread of tumor. Findings are compatible with diffuse metastatic disease. The extent of metastatic disease is relatively stable from the prior PET/CT.  No superimposed focal airspace consolidation is seen. Mild interstitial prominence may reflect mild superimposed interstitial edema. No pleural effusion or pneumothorax is seen.  The mediastinum is unremarkable in appearance. No mediastinal lymphadenopathy is seen. No pericardial effusion is identified. The great vessels are grossly unremarkable. No axillary lymphadenopathy is seen. The visualized portions of the thyroid gland are unremarkable in appearance.  The visualized portions of the liver and spleen are unremarkable. A tiny hiatal hernia is noted. The visualized portions of the gallbladder, pancreas and adrenal glands are within normal limits.  No acute osseous abnormalities are seen.  Review of the MIP images confirms the above findings.  IMPRESSION: 1. No evidence of pulmonary embolus. 2. Mild interstitial prominence, suggestive of mild superimposed interstitial edema. 3. Numerous bilateral pulmonary nodules again noted, with a dominant 5.3 cm mass at the left lung base. Findings compatible with diffuse metastatic disease, relatively stable from the recent prior PET/CT. 4. Tiny hiatal hernia noted.   Electronically Signed   By: JGarald BaldingM.D.   On: 05/13/2015 23:13    ASSESSMENT AND PLAN: This is a very pleasant 69years old white female recently diagnosed with a stage IV non-small cell lung cancer, adenocarcinoma with positive EGFR mutation with deletion in exon 19. Her recent MRI of the brain showed no evidence for disease metastasis. She is currently being treated with Gilotrif 40 mg by mouth daily. Treatment beginning 05/11/2015. Overall she's tolerating this medication relatively well. Patient was discussed with and also seen by Dr. MJulien Nordmann She will continue on the Gilotrif at the current dose. She'll  follow-up in approximately 5 weeks with a restaging CT scan of the chest, abdomen and pelvis to reevaluate her disease. For the skin eruption/rash a prescription for clindamycin solution was sent her pharmacy of record via E scribe.  She is again advised to keep Imodium with her at home for treatment of diarrhea as needed.  She was advised to call immediately if she has any concerning symptoms in the interval. The patient voices understanding of current disease status and treatment options and is in agreement with the current care plan.  All questions were answered. The patient knows to call the clinic with any problems, questions or concerns. We can certainly see the patient much sooner if necessary.  JCarlton Adam PA-C 06/03/2015  ADDENDUM: Hematology/Oncology Attending: I had a face to face encounter with the patient. I recommended her care plan. This is a very pleasant 69years old white female with a stage IV non-small cell lung cancer, adenocarcinoma with positive EGFR mutation with deletion in exon 19. She is currently undergoing treatment with targeted therapy with Gilotrif, status  post 3 weeks of treatment and tolerating it fairly well except for mild skin rash on the face and neck area as well as episodes of diarrhea. She denied having any significant nausea or vomiting, no weight loss or night sweats. I recommended for the patient to continue her current treatment with Gilotrif at the same dose. She would come back for follow-up visit in 5 weeks after repeating CT scan of the chest, abdomen and pelvis for restaging of her disease. The patient was advised to call immediately if she has any concerning symptoms in the interval.  Disclaimer: This note was dictated with voice recognition software. Similar sounding words can inadvertently be transcribed and may not be corrected upon review. Eilleen Kempf., MD 06/05/2015

## 2015-06-03 NOTE — Telephone Encounter (Signed)
Gave patient avs report and appointments for September. Central radiology will contact patient re ct scan 2-4 weeks prior to expected date.

## 2015-06-04 NOTE — Patient Instructions (Signed)
Continue Gilotrif at the current dose Follow up in approximately 5 weeks with a restaging CT scan to re-evaluate your disease

## 2015-06-09 ENCOUNTER — Encounter: Payer: Self-pay | Admitting: Skilled Nursing Facility1

## 2015-06-09 NOTE — Progress Notes (Signed)
Subjective:     Patient ID: Elizabeth [name], female   DOB: 10/27/1945, 69 y.o.   MRN: [id number]  HPI   Review of Systems     Objective:   Physical Exam To assist the pt in identifying some dietary strategies to gain some lost wt back.      Assessment:     Pt identified as being malnourished due to losing some wt. Pt was contacted via the telephone at [phone]. Pt states she has lost about 8 or 9 pounds but it has been slow. Pt states she has very little appetite but she is making eating a priority. Pt states she eats breakfast, chicken, and a fruit smoothie every day as a baseline.     Plan:     No plan identified at this time.

## 2015-06-11 ENCOUNTER — Encounter: Payer: Self-pay | Admitting: Internal Medicine

## 2015-06-11 ENCOUNTER — Ambulatory Visit (INDEPENDENT_AMBULATORY_CARE_PROVIDER_SITE_OTHER): Payer: Medicare Other | Admitting: Internal Medicine

## 2015-06-11 VITALS — BP 132/84 | HR 80 | Temp 97.2°F | Resp 16 | Ht 60.0 in | Wt 161.6 lb

## 2015-06-11 DIAGNOSIS — E559 Vitamin D deficiency, unspecified: Secondary | ICD-10-CM | POA: Diagnosis not present

## 2015-06-11 DIAGNOSIS — C3490 Malignant neoplasm of unspecified part of unspecified bronchus or lung: Secondary | ICD-10-CM

## 2015-06-11 DIAGNOSIS — J0410 Acute tracheitis without obstruction: Secondary | ICD-10-CM | POA: Diagnosis not present

## 2015-06-11 DIAGNOSIS — Z6830 Body mass index (BMI) 30.0-30.9, adult: Secondary | ICD-10-CM | POA: Diagnosis not present

## 2015-06-11 DIAGNOSIS — E039 Hypothyroidism, unspecified: Secondary | ICD-10-CM

## 2015-06-11 DIAGNOSIS — E669 Obesity, unspecified: Secondary | ICD-10-CM

## 2015-06-11 DIAGNOSIS — E782 Mixed hyperlipidemia: Secondary | ICD-10-CM | POA: Diagnosis not present

## 2015-06-11 DIAGNOSIS — I10 Essential (primary) hypertension: Secondary | ICD-10-CM

## 2015-06-11 DIAGNOSIS — R7309 Other abnormal glucose: Secondary | ICD-10-CM | POA: Diagnosis not present

## 2015-06-11 DIAGNOSIS — R7303 Prediabetes: Secondary | ICD-10-CM

## 2015-06-11 DIAGNOSIS — Z79899 Other long term (current) drug therapy: Secondary | ICD-10-CM

## 2015-06-11 LAB — CBC WITH DIFFERENTIAL/PLATELET
BASOS ABS: 0 10*3/uL (ref 0.0–0.1)
BASOS PCT: 0 % (ref 0–1)
Eosinophils Absolute: 1.2 10*3/uL — ABNORMAL HIGH (ref 0.0–0.7)
Eosinophils Relative: 18 % — ABNORMAL HIGH (ref 0–5)
HCT: 37.6 % (ref 36.0–46.0)
Hemoglobin: 12.6 g/dL (ref 12.0–15.0)
LYMPHS ABS: 1.1 10*3/uL (ref 0.7–4.0)
Lymphocytes Relative: 16 % (ref 12–46)
MCH: 29 pg (ref 26.0–34.0)
MCHC: 33.5 g/dL (ref 30.0–36.0)
MCV: 86.4 fL (ref 78.0–100.0)
MPV: 10.4 fL (ref 8.6–12.4)
Monocytes Absolute: 0.4 10*3/uL (ref 0.1–1.0)
Monocytes Relative: 6 % (ref 3–12)
NEUTROS PCT: 60 % (ref 43–77)
Neutro Abs: 4 10*3/uL (ref 1.7–7.7)
Platelets: 224 10*3/uL (ref 150–400)
RBC: 4.35 MIL/uL (ref 3.87–5.11)
RDW: 13.5 % (ref 11.5–15.5)
WBC: 6.7 10*3/uL (ref 4.0–10.5)

## 2015-06-11 LAB — LIPID PANEL
CHOL/HDL RATIO: 4.8 ratio (ref <=5.0)
Cholesterol: 119 mg/dL — ABNORMAL LOW (ref 125–200)
HDL: 25 mg/dL — AB (ref >=46)
LDL CALC: 77 mg/dL (ref <130)
Triglycerides: 83 mg/dL (ref <150)
VLDL: 17 mg/dL (ref <30)

## 2015-06-11 LAB — TSH: TSH: 4.219 u[IU]/mL (ref 0.350–4.500)

## 2015-06-11 LAB — HEPATIC FUNCTION PANEL
ALT: 71 U/L — ABNORMAL HIGH (ref 6–29)
AST: 48 U/L — AB (ref 10–35)
Albumin: 3.3 g/dL — ABNORMAL LOW (ref 3.6–5.1)
Alkaline Phosphatase: 96 U/L (ref 33–130)
BILIRUBIN DIRECT: 0.4 mg/dL — AB (ref <=0.2)
BILIRUBIN INDIRECT: 0.9 mg/dL (ref 0.2–1.2)
Total Bilirubin: 1.3 mg/dL — ABNORMAL HIGH (ref 0.2–1.2)
Total Protein: 5.9 g/dL — ABNORMAL LOW (ref 6.1–8.1)

## 2015-06-11 LAB — BASIC METABOLIC PANEL WITH GFR
BUN: 13 mg/dL (ref 7–25)
CHLORIDE: 107 mmol/L (ref 98–110)
CO2: 23 mmol/L (ref 20–31)
Calcium: 8.3 mg/dL — ABNORMAL LOW (ref 8.6–10.4)
Creat: 0.92 mg/dL (ref 0.50–0.99)
GFR, Est African American: 74 mL/min (ref >=60)
GFR, Est Non African American: 64 mL/min (ref >=60)
Glucose, Bld: 95 mg/dL (ref 65–99)
POTASSIUM: 3.9 mmol/L (ref 3.5–5.3)
SODIUM: 140 mmol/L (ref 135–146)

## 2015-06-11 LAB — MAGNESIUM: Magnesium: 1.8 mg/dL (ref 1.5–2.5)

## 2015-06-11 LAB — HEMOGLOBIN A1C
Hgb A1c MFr Bld: 5.8 % — ABNORMAL HIGH (ref <5.7)
Mean Plasma Glucose: 120 mg/dL — ABNORMAL HIGH (ref <117)

## 2015-06-11 MED ORDER — PREDNISONE 20 MG PO TABS: ORAL_TABLET | ORAL | Status: DC

## 2015-06-11 MED ORDER — AZITHROMYCIN 250 MG PO TABS: ORAL_TABLET | ORAL | Status: DC

## 2015-06-11 NOTE — Progress Notes (Signed)
Patient ID: Elizabeth [name], female   DOB: 1946/04/02, 69 y.o.   MRN: [id number]     This very nice 69 y.o. MWF presents for  follow up with Hypertension, Hyperlipidemia, Pre-Diabetes and Vitamin D Deficiency. Patient also reports a persistent cough productive of scant amounts of thick ? mucopurulent sputum.         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     Patient is treated for HTN since 1997 & BP has been controlled at home. Today's BP: 132/84 mmHg. Patient has had no complaints of any cardiac type chest pain, palpitations, dyspnea/orthopnea/PND, dizziness, claudication, or dependent edema.     Hyperlipidemia is controlled with diet & meds. Patient denies myalgias or other med SE's. Last Lipids were Cholesterol 125; HDL 43*; LDL 68; Triglycerides 69 on  02/25/2015.    Also, the patient has history of PreDiabetes since 2012 with A1c 5.7% and has had no symptoms of reactive hypoglycemia, diabetic polys, paresthesias or visual blurring.  Last A1c was 5.8% on 02/25/2015.     Further, the patient also has history of Vitamin D Deficiency and supplements vitamin D without any suspected side-effects. Last vitamin D was 51 on 02/25/2015.  Medication Sig  . afatinib (GILOTRIF) 40 MG tablet Take 1 tablet (40 mg total) by mouth daily. Take on an empty stomach 1hr before or 2 hrs after meals.  Marland Kitchen aspirin 81 MG tablet Take 81 mg by mouth at bedtime.   . cetirizine  10 MG tablet Take 10 mg by mouth daily as needed for allergies.  . clindamycin (CLEOCIN-T) 1 %  solution Apply topically 2 (two) times daily. To affected areas  . EST ESTROGENS-METHYLTEST HS 0.625-1.25 MG per tablet  TAKE 1 TAB 3 x/wk on MWF  .  HYCODAN)L syrup Take 5 mLs by mouth every 6 (six) hours as needed for cough.  . levothyroxine  50 MCG tablet TAKE 1.5 tab 3 x /wk & 1 tab 4 x/wk  .  meloxicam  15 MG tablet Take 15 mg by mouth daily as needed for pain.  . montelukast  10 MG tablet TAKE 1 TABLET BY MOUTH EVERY DAY (Patient taking differently: TAKE 1 TABLET BY MOUTH DAILY AT BEDTIME)  . ondansetron  8 MG tablet Take 1 tablet (8 mg total) by mouth every 8 (eight) hours as needed for nausea or vomiting.  . simvastatin  20 MG tablet TAKE 1 TABLET BY MOUTH EVERY NIGHT AT BEDTIME  . triamterene-hctz (MAXZIDE) 75-50  TAKE 1 TABLET BY MOUTH EVERY DAY   Allergies  Allergen Reactions  . Penicillins Swelling and Rash   PMHx:   Past Medical History  Diagnosis Date  . Hypertension   . Hyperlipidemia   . Thyroid disease   . Prediabetes   . Allergy   . Anxiety   . Insomnia   . Cancer    Immunization History  Administered Date(s) Administered  . Influenza, High Dose Seasonal PF 07/31/2014  . Pneumococcal Polysaccharide-23 07/22/2012  . Td 12/25/2005  . Zoster 01/09/2007   Past Surgical History  Procedure Laterality Date  . Tonsillectomy and adenoidectomy    . Abdominal hysterectomy  1995    w BSO   FHx:    Reviewed / unchanged  SHx:    Reviewed / unchanged  Systems Review:  Constitutional:  Denies fever, chills, wt changes, headaches, insomnia, fatigue, night sweats, change in appetite. Eyes: Denies redness, blurred vision, diplopia, discharge, itchy, watery eyes.  ENT: Denies discharge, congestion, post nasal drip, epistaxis, sore throat, earache, hearing loss, dental pain, tinnitus, vertigo, sinus pain, snoring.  CV: Denies chest pain, palpitations, irregular heartbeat, syncope, dyspnea, diaphoresis, orthopnea, PND, claudication or edema. Respiratory: denies cough, dyspnea, DOE, pleurisy, hoarseness, laryngitis, wheezing.  Gastrointestinal: Denies dysphagia, odynophagia, heartburn, reflux, water brash, abdominal pain or cramps, nausea, vomiting, bloating, diarrhea, constipation, hematemesis, melena, hematochezia  or hemorrhoids. Genitourinary: Denies dysuria,  frequency, urgency, nocturia, hesitancy, discharge, hematuria or flank pain. Musculoskeletal: Denies arthralgias, myalgias, stiffness, jt. swelling, pain, limping or strain/sprain.  Skin: Denies pruritus, rash, hives, warts, acne, eczema or change in skin lesion(s). Neuro: No weakness, tremor, incoordination, spasms, paresthesia or pain. Psychiatric: Denies confusion, memory loss or sensory loss. Endo: Denies change in weight, skin or hair change.  Heme/Lymph: No excessive bleeding, bruising or enlarged lymph nodes.  Physical Exam  BP 132/84   Pulse 80  Temp 97.2 F   Resp 16  Ht 5'   Wt 161 lb 9.6 oz     BMI 31.56   Appears over nourished and in no distress.  Eyes: PERRLA, EOMs, conjunctiva no swelling or erythema. Sinuses: No frontal/maxillary tenderness ENT/Mouth: EAC's clear, TM's nl w/o erythema, bulging. Nares clear w/o erythema, swelling, exudates. Oropharynx clear without erythema or exudates. Oral hygiene is good. Tongue normal, non obstructing. Hearing intact.  Neck: Supple. Thyroid nl. Car 2+/2+ without bruits, nodes or JVD. Chest: Scattered mediaum rales/ rhonchi with a ? Of forced post-tussive expiratory wheezes. No stridor.  Cor: Heart sounds normal w/ regular rate and rhythm without sig. murmurs, gallops, clicks, or rubs. Peripheral pulses normal and equal  without edema.  Abdomen: Soft & bowel sounds normal. Non-tender w/o guarding, rebound, hernias, masses, or organomegaly.  Lymphatics: Unremarkable.  Musculoskeletal: Full ROM all peripheral extremities, joint stability, 5/5 strength, and normal gait.  Skin: Warm, dry without exposed rashes, lesions or ecchymosis apparent.  Neuro: Cranial nerves intact, reflexes equal bilaterally. Sensory-motor testing grossly intact.   Pysch: Alert & oriented x 3.  Insight and judgement nl & appropriate. No ideations.  Assessment and Plan:  1. Essential hypertension  - TSH  2. Hyperlipidemia  - Lipid panel  3.  Prediabetes  - Hemoglobin A1c - Insulin, random  4. Vitamin D deficiency  - Vit D  25 hydroxy   5. Adenocarcinoma of  Lung, Stage 4   6. Obesity   7. Hypothyroidism   8. Medication management  - CBC with Differential/Platelet - BASIC METABOLIC PANEL WITH GFR - Hepatic function panel - Magnesium  9. BMI 30.0-30.9,adult   10. Tracheitis  - predniSONE (DELTASONE) 20 MG tablet; 1 tab 3 x day for 3 days, then 1 tab 2 x day for 3 days, then 1 tab 1 x day for 5 days  Dispense: 20 tablet; Refill: 0 - azithromycin (ZITHROMAX) 250 MG tablet; Take 2 tablets (500 mg) on  Day 1,  followed by 1 tablet (250 mg) once daily on Days 2 through 5.  Dispense: 6 each; Refill: 1   Recommended regular exercise, BP monitoring, weight control, and discussed med and SE's. Recommended labs to assess and monitor clinical status. Further disposition pending results of labs. Over 30 minutes of exam, counseling, chart review was performed

## 2015-06-11 NOTE — Patient Instructions (Addendum)
Recommend Adult Low dose Aspirin or   coated  Aspirin 81 mg daily   To reduce risk of Colon Cancer 20 %,   Skin Cancer 26 % ,   Melanoma 46%   and   Pancreatic cancer 60%  ++++++++++++++++++  Vitamin D goal   is between 70-100.   Please make sure that you are taking your Vitamin D as directed.   It is very important as a natural anti-inflammatory   helping hair, skin, and nails, as well as reducing stroke and heart attack risk.   It helps your bones and helps with mood.  It also decreases numerous cancer risks so please take it as directed.   Low Vit D is associated with a 200-300% higher risk for CANCER   and 200-300% higher risk for HEART   ATTACK  &  STROKE.   .....................................Marland Kitchen  It is also associated with higher death rate at younger ages,   autoimmune diseases like Rheumatoid arthritis, Lupus, Multiple Sclerosis.     Also many other serious conditions, like depression, Alzheimer's  Dementia, infertility, muscle aches, fatigue, fibromyalgia - just to name a few.  +++++++++++++++++++  Recommend the book "The END of DIETING" by Dr Excell Seltzer   & the book "The END of DIABETES " by Dr Excell Seltzer  At [facility]com - get book & Audio CD's     Being diabetic has a  300% increased risk for heart attack, stroke, cancer, and alzheimer- type vascular dementia. It is very important that you work harder with diet by avoiding all foods that are white. Avoid white rice (brown & wild rice is OK), white potatoes (sweetpotatoes in moderation is OK), White bread or wheat bread or anything made out of white flour like bagels, donuts, rolls, buns, biscuits, cakes, pastries, cookies, pizza crust, and pasta (made from white flour & egg whites) - vegetarian pasta or spinach or wheat pasta is OK. Multigrain breads like Arnold's or [employer], or multigrain sandwich thins or flatbreads.  Diet, exercise and weight loss can reverse and cure diabetes in the early  stages.  Diet, exercise and weight loss is very important in the control and prevention of complications of diabetes which affects every system in your body, ie. Brain - dementia/stroke, eyes - glaucoma/blindness, heart - heart attack/heart failure, kidneys - dialysis, stomach - gastric paralysis, intestines - malabsorption, nerves - severe painful neuritis, circulation - gangrene & loss of a leg(s), and finally cancer and Alzheimers.    I recommend avoid fried & greasy foods,  sweets/candy, white rice (brown or wild rice or Quinoa is OK), white potatoes (sweet potatoes are OK) - anything made from white flour - bagels, doughnuts, rolls, buns, biscuits,white and wheat breads, pizza crust and traditional pasta made of white flour & egg white(vegetarian pasta or spinach or wheat pasta is OK).  Multi-grain bread is OK - like multi-grain flat bread or sandwich thins. Avoid alcohol in excess. Exercise is also important.    Eat all the vegetables you want - avoid meat, especially red meat and dairy - especially cheese.  Cheese is the most concentrated form of trans-fats which is the worst thing to clog up our arteries. Veggie cheese is OK which can be found in the fresh produce section at [employer] or [employer] or [employer]  ++++++++++++++++++++++++++    +++++++++++++++++++++++++++++++  Vitamin D Deficiency Vitamin D is an important vitamin that your body needs. Having too little of it in your body is called a deficiency. A  very bad deficiency can make your bones soft and can cause a condition called rickets.  Vitamin D is important to your body for different reasons, such as:   It helps your body absorb 2 minerals called calcium and phosphorus.  It helps make your bones healthy.  It may prevent some diseases, such as diabetes and multiple sclerosis.  It helps your muscles and heart. You can get vitamin D in several ways. It is a natural part of some foods. The vitamin is also added to some  dairy products and cereals. Some people take vitamin D supplements. Also, your body makes vitamin D when you are in the sun. It changes the sun's rays into a form of the vitamin that your body can use. CAUSES   Not eating enough foods that contain vitamin D.  Not getting enough sunlight.  Having certain digestive system diseases that make it hard to absorb vitamin D. These diseases include Crohn's disease, chronic pancreatitis, and cystic fibrosis.  Having a surgery in which part of the stomach or small intestine is removed.  Being obese. Fat cells pull vitamin D out of your blood. That means that obese people may not have enough vitamin D left in their blood and in other body tissues.  Having chronic kidney or liver disease. RISK FACTORS Risk factors are things that make you more likely to develop a vitamin D deficiency. They include:  Being older.  Not being able to get outside very much.  Living in a nursing home.  Having had broken bones.  Having weak or thin bones (osteoporosis).  Having a disease or condition that changes how your body absorbs vitamin D.  Having dark skin.  Some medicines such as seizure medicines or steroids.  Being overweight or obese. SYMPTOMS Mild cases of vitamin D deficiency may not have any symptoms. If you have a very bad case, symptoms may include:  Bone pain.  Muscle pain.  Falling often.  Broken bones caused by a minor injury, due to osteoporosis. DIAGNOSIS A blood test is the best way to tell if you have a vitamin D deficiency. TREATMENT Vitamin D deficiency can be treated in different ways. Treatment for vitamin D deficiency depends on what is causing it. Options include:  Taking vitamin D supplements.  Taking a calcium supplement. Your caregiver will suggest what dose is best for you. HOME CARE INSTRUCTIONS  Take any supplements that your caregiver prescribes. Follow the directions carefully. Take only the suggested  amount.  Have your blood tested 2 months after you start taking supplements.  Eat foods that contain vitamin D. Healthy choices include:  Fortified dairy products, cereals, or juices. Fortified means vitamin D has been added to the food. Check the label on the package to be sure.  Fatty fish like salmon or trout.  Eggs.  Oysters.  Do not use a tanning bed.  Keep your weight at a healthy level. Lose weight if you need to.  Keep all follow-up appointments. Your caregiver will need to perform blood tests to make sure your vitamin D deficiency is going away. SEEK MEDICAL CARE IF:  You have any questions about your treatment.  You continue to have symptoms of vitamin D deficiency.  You have nausea or vomiting.  You are constipated.  You feel confused.  You have severe abdominal or back pain. MAKE SURE YOU:  Understand these instructions.  Will watch your condition.  Will get help right away if you are not doing well or get  worse.

## 2015-06-12 LAB — INSULIN, RANDOM: Insulin: 15.9 u[IU]/mL (ref 2.0–19.6)

## 2015-06-12 LAB — VITAMIN D 25 HYDROXY (VIT D DEFICIENCY, FRACTURES): VIT D 25 HYDROXY: 34 ng/mL (ref 30–100)

## 2015-06-13 ENCOUNTER — Encounter: Payer: Self-pay | Admitting: Internal Medicine

## 2015-07-01 ENCOUNTER — Other Ambulatory Visit: Payer: Self-pay | Admitting: Medical Oncology

## 2015-07-01 ENCOUNTER — Encounter (HOSPITAL_COMMUNITY): Payer: Self-pay

## 2015-07-01 ENCOUNTER — Other Ambulatory Visit (HOSPITAL_BASED_OUTPATIENT_CLINIC_OR_DEPARTMENT_OTHER): Payer: Medicare Other

## 2015-07-01 ENCOUNTER — Ambulatory Visit (HOSPITAL_COMMUNITY)
Admission: RE | Admit: 2015-07-01 | Discharge: 2015-07-01 | Disposition: A | Discharge status: Home / Self Care | Payer: Medicare Other | Source: Ambulatory Visit | Attending: Physician Assistant | Admitting: Physician Assistant

## 2015-07-01 DIAGNOSIS — C3490 Malignant neoplasm of unspecified part of unspecified bronchus or lung: Secondary | ICD-10-CM | POA: Diagnosis not present

## 2015-07-01 DIAGNOSIS — R110 Nausea: Secondary | ICD-10-CM

## 2015-07-01 DIAGNOSIS — M5137 Other intervertebral disc degeneration, lumbosacral region: Secondary | ICD-10-CM | POA: Diagnosis not present

## 2015-07-01 DIAGNOSIS — R911 Solitary pulmonary nodule: Secondary | ICD-10-CM | POA: Diagnosis not present

## 2015-07-01 DIAGNOSIS — C3432 Malignant neoplasm of lower lobe, left bronchus or lung: Secondary | ICD-10-CM | POA: Diagnosis not present

## 2015-07-01 DIAGNOSIS — D1803 Hemangioma of intra-abdominal structures: Secondary | ICD-10-CM | POA: Insufficient documentation

## 2015-07-01 LAB — COMPREHENSIVE METABOLIC PANEL (CC13)
ALT: 103 U/L — ABNORMAL HIGH (ref 0–55)
AST: 38 U/L — ABNORMAL HIGH (ref 5–34)
Albumin: 3.2 g/dL — ABNORMAL LOW (ref 3.5–5.0)
Alkaline Phosphatase: 87 U/L (ref 40–150)
Anion Gap: 10 mEq/L (ref 3–11)
BUN: 13 mg/dL (ref 7.0–26.0)
CO2: 25 meq/L (ref 22–29)
Calcium: 9.1 mg/dL (ref 8.4–10.4)
Chloride: 107 mEq/L (ref 98–109)
Creatinine: 1.1 mg/dL (ref 0.6–1.1)
EGFR: 52 mL/min/{1.73_m2} — AB (ref >90)
GLUCOSE: 88 mg/dL (ref 70–140)
POTASSIUM: 3.9 meq/L (ref 3.5–5.1)
SODIUM: 142 meq/L (ref 136–145)
TOTAL PROTEIN: 6.1 g/dL — AB (ref 6.4–8.3)
Total Bilirubin: 2.02 mg/dL — ABNORMAL HIGH (ref 0.20–1.20)

## 2015-07-01 LAB — CBC WITH DIFFERENTIAL/PLATELET
BASO%: 0.5 % (ref 0.0–2.0)
Basophils Absolute: 0 10*3/uL (ref 0.0–0.1)
EOS%: 5.2 % (ref 0.0–7.0)
Eosinophils Absolute: 0.4 10*3/uL (ref 0.0–0.5)
HCT: 38 % (ref 34.8–46.6)
HEMOGLOBIN: 12.6 g/dL (ref 11.6–15.9)
LYMPH%: 15.1 % (ref 14.0–49.7)
MCH: 28.5 pg (ref 25.1–34.0)
MCHC: 33.1 g/dL (ref 31.5–36.0)
MCV: 86.2 fL (ref 79.5–101.0)
MONO#: 0.7 10*3/uL (ref 0.1–0.9)
MONO%: 9.3 % (ref 0.0–14.0)
NEUT%: 69.9 % (ref 38.4–76.8)
NEUTROS ABS: 5 10*3/uL (ref 1.5–6.5)
Platelets: 181 10*3/uL (ref 145–400)
RBC: 4.41 10*6/uL (ref 3.70–5.45)
RDW: 14 % (ref 11.2–14.5)
WBC: 7.1 10*3/uL (ref 3.9–10.3)
lymph#: 1.1 10*3/uL (ref 0.9–3.3)

## 2015-07-01 MED ORDER — ONDANSETRON HCL 8 MG PO TABS: 8.0000 mg | ORAL_TABLET | Freq: Three times a day (TID) | ORAL | Status: DC | PRN

## 2015-07-01 MED ORDER — IOHEXOL 300 MG/ML  SOLN
100.0000 mL | Freq: Once | INTRAMUSCULAR | Status: AC | PRN
  Administered 2015-07-01: 100 mL via INTRAVENOUS

## 2015-07-05 ENCOUNTER — Encounter: Payer: Self-pay | Admitting: Internal Medicine

## 2015-07-05 ENCOUNTER — Telehealth: Payer: Self-pay | Admitting: Internal Medicine

## 2015-07-05 ENCOUNTER — Ambulatory Visit (HOSPITAL_BASED_OUTPATIENT_CLINIC_OR_DEPARTMENT_OTHER): Payer: Medicare Other | Admitting: Internal Medicine

## 2015-07-05 VITALS — BP 144/77 | HR 88 | Temp 98.3°F | Resp 18 | Ht 60.0 in | Wt 154.9 lb

## 2015-07-05 DIAGNOSIS — C3432 Malignant neoplasm of lower lobe, left bronchus or lung: Secondary | ICD-10-CM | POA: Diagnosis not present

## 2015-07-05 DIAGNOSIS — C3490 Malignant neoplasm of unspecified part of unspecified bronchus or lung: Secondary | ICD-10-CM

## 2015-07-05 NOTE — Progress Notes (Signed)
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  PRIOR THERAPY: None  CURRENT THERAPY: Gilotrif 40 mg by mouth daily. First dose started on 05/11/2015. Status post 2 months of treatment.  INTERVAL HISTORY: JEFFERY GAMMELL 69 y.o. female returns to the clinic today for follow-up visit accompanied by her husband, son and sister. The patient is feeling fine today with no specific complaints except for occasional episodes of diarrhea resolved with Imodium. She has fatigue and weakness after taking Gilotrif in the morning and she switched to night time and feeling much better. She denied having any significant chest pain, cough or hemoptysis. She has no significant weight loss or night sweats. She denied having any significant nausea or vomiting, no fever or chills. She had repeat CT scan of the chest performed recently and she is here for evaluation and discussion of her scan results.  MEDICAL HISTORY: Past Medical History  Diagnosis Date  . Hypertension   . Hyperlipidemia   . Thyroid disease   . Prediabetes   . Allergy   . Anxiety   . Insomnia   . Cancer     ALLERGIES:  is allergic to penicillins.  MEDICATIONS:  Current Outpatient Prescriptions  Medication Sig Dispense Refill  . afatinib dimaleate (GILOTRIF) 40 MG tablet Take 1 tablet (40 mg total) by mouth daily. Take on an empty stomach 1hr before or 2 hrs after meals. 30 tablet 2  . aspirin 81 MG tablet Take 81 mg by mouth at bedtime.     . clindamycin (CLEOCIN-T) 1 % external solution Apply topically 2 (two) times daily. To  affected areas 30 mL 1  . montelukast (SINGULAIR) 10 MG tablet TAKE 1 TABLET BY MOUTH EVERY DAY (Patient taking differently: TAKE 1 TABLET BY MOUTH DAILY AT BEDTIME) 30 tablet 3  . ondansetron (ZOFRAN) 8 MG tablet Take 1 tablet (8 mg total) by mouth every 8 (eight) hours as needed for nausea or vomiting. 20 tablet 0  . cetirizine (ZYRTEC) 10 MG tablet Take 10 mg by mouth daily as needed for allergies.    Marland Kitchen levothyroxine (SYNTHROID, LEVOTHROID) 50 MCG tablet TAKE 1 TABLET BY MOUTH ONCE DAILY (Patient taking differently: TAKE 1 (5MCG) TABLET BY MOUTH THREE TIMES WEEKLY ON MON, WED, AND FRI AND TAKE 1 1/2 (75MCG) ON ALL OTHER DAYS) 90 tablet 99  . simvastatin (ZOCOR) 20 MG tablet TAKE 1 TABLET BY MOUTH EVERY NIGHT AT BEDTIME 90 tablet 99   No current facility-administered medications for this visit.    SURGICAL HISTORY:  Past Surgical History  Procedure Laterality Date  . Tonsillectomy and adenoidectomy    . Abdominal hysterectomy  1995    w BSO    REVIEW OF SYSTEMS:  Constitutional: negative Eyes: negative Ears, nose, mouth, throat, and face: negative Respiratory: negative Cardiovascular: negative Gastrointestinal: positive for diarrhea Genitourinary:negative Integument/breast: negative Hematologic/lymphatic: negative Musculoskeletal:negative Neurological: negative Behavioral/Psych: negative Endocrine: negative Allergic/Immunologic: negative   PHYSICAL EXAMINATION: General appearance: alert, cooperative and no distress Head: Normocephalic, without obvious abnormality, atraumatic Neck: no adenopathy, no JVD, supple, symmetrical, trachea midline and thyroid not enlarged, symmetric, no tenderness/mass/nodules Lymph nodes: Cervical,  supraclavicular, and axillary nodes normal. Resp: clear to auscultation bilaterally Back: symmetric, no curvature. ROM normal. No CVA tenderness. Cardio: regular rate and rhythm, S1, S2 normal, no murmur, click, rub or gallop GI: soft, non-tender; bowel  sounds normal; no masses,  no organomegaly Extremities: extremities normal, atraumatic, no cyanosis or edema Neurologic: Alert and oriented X 3, normal strength and tone. Normal symmetric reflexes. Normal coordination and gait  ECOG PERFORMANCE STATUS: 0 - Asymptomatic  Blood pressure 144/77, pulse 88, temperature 98.3 F (36.8 C), temperature source Oral, resp. rate 18, height 5' (1.524 m), weight 154 lb 14.4 oz (70.262 kg), SpO2 99 %.  LABORATORY DATA: Lab Results  Component Value Date   WBC 7.1 07/01/2015   HGB 12.6 07/01/2015   HCT 38.0 07/01/2015   MCV 86.2 07/01/2015   PLT 181 07/01/2015      Chemistry      Component Value Date/Time   NA 142 07/01/2015 1048   NA 140 06/11/2015 1208   K 3.9 07/01/2015 1048   K 3.9 06/11/2015 1208   CL 107 06/11/2015 1208   CO2 25 07/01/2015 1048   CO2 23 06/11/2015 1208   BUN 13.0 07/01/2015 1048   BUN 13 06/11/2015 1208   CREATININE 1.1 07/01/2015 1048   CREATININE 0.92 06/11/2015 1208   CREATININE 1.17* 05/13/2015 1855      Component Value Date/Time   CALCIUM 9.1 07/01/2015 1048   CALCIUM 8.3* 06/11/2015 1208   ALKPHOS 87 07/01/2015 1048   ALKPHOS 96 06/11/2015 1208   AST 38* 07/01/2015 1048   AST 48* 06/11/2015 1208   ALT 103* 07/01/2015 1048   ALT 71* 06/11/2015 1208   BILITOT 2.02* 07/01/2015 1048   BILITOT 1.3* 06/11/2015 1208       RADIOGRAPHIC STUDIES: Ct Chest W Contrast  07/01/2015   CLINICAL DATA:  New diagnosis small cell lung cancer, currently non operative.  EXAM: CT CHEST, ABDOMEN, AND PELVIS WITH CONTRAST  TECHNIQUE: Multidetector CT imaging of the chest, abdomen and pelvis was performed following the standard protocol during bolus administration of intravenous contrast.  CONTRAST:  145m OMNIPAQUE IOHEXOL 300 MG/ML  SOLN  COMPARISON:  Multiple exams, including 03/16/2015 and 03/25/2015 as well as 05/13/2015  FINDINGS: CT CHEST FINDINGS  Mediastinum/Nodes: Currently no pathologic adenopathy. Mild pectus  excavatum. Small type 1 hiatal hernia.  Lungs/Pleura: Scattered bilateral pulmonary nodules with indistinct margins in both lungs. In general these appear more solid centrally with a hazy margin some have associated air bronchograms or cavitation. The index left lower lobe lesion, which likely represents tumor and some adjacent volume loss, measures 2.6 by 2.5 cm, formerly 3.3 by 3.2 cm. Generally the nodules appear somewhat smaller than before.  Musculoskeletal: Lower cervical spondylosis.  CT ABDOMEN PELVIS FINDINGS  Hepatobiliary: Posterior right hepatic lobe hemangioma, stable.  Pancreas: Unremarkable  Spleen: Punctate calcification in the lower spleen, compatible with remote granulomatous disease.  Adrenals/Urinary Tract: Unremarkable  Stomach/Bowel: Unremarkable  Vascular/Lymphatic: Aortoiliac atherosclerotic vascular disease.  Reproductive: Uterus absent.  Ovaries not well seen.  Other: No supplemental non-categorized findings.  Musculoskeletal: Grade 1 anterolisthesis at L4-5. Degenerative disc disease and endplate sclerosis at LM0-Q6with left foraminal stenosis at L4-5 and L5-S1.  IMPRESSION: 1. Improvement in size of the left lower lobe nodule, now 2.6 by 2.5 cm, previously 8 3.3 cm mass. 2. Scattered nodules in both lungs . Reduced in size. These have year is somewhat unusual feature of some peripheral sub solid appearance which can be seen in the setting of  hemorrhagic lesions, and some may be minimally cavitary. These are likely malignant ; some types of fungal pneumonia can manifest in a similar imaging fashion. 3. No findings of metastatic disease below the diaphragms. 4. Right posterior hepatic lobe hemangioma. 5. Left foraminal impingement at L4-5 and L5-S1 due to spondylosis and degenerative disc disease.   Electronically Signed   By: Van Clines M.D.   On: 07/01/2015 16:07   Ct Abdomen Pelvis W Contrast  07/01/2015   CLINICAL DATA:  New diagnosis small cell lung cancer, currently non  operative.  EXAM: CT CHEST, ABDOMEN, AND PELVIS WITH CONTRAST  TECHNIQUE: Multidetector CT imaging of the chest, abdomen and pelvis was performed following the standard protocol during bolus administration of intravenous contrast.  CONTRAST:  12m OMNIPAQUE IOHEXOL 300 MG/ML  SOLN  COMPARISON:  Multiple exams, including 03/16/2015 and 03/25/2015 as well as 05/13/2015  FINDINGS: CT CHEST FINDINGS  Mediastinum/Nodes: Currently no pathologic adenopathy. Mild pectus excavatum. Small type 1 hiatal hernia.  Lungs/Pleura: Scattered bilateral pulmonary nodules with indistinct margins in both lungs. In general these appear more solid centrally with a hazy margin some have associated air bronchograms or cavitation. The index left lower lobe lesion, which likely represents tumor and some adjacent volume loss, measures 2.6 by 2.5 cm, formerly 3.3 by 3.2 cm. Generally the nodules appear somewhat smaller than before.  Musculoskeletal: Lower cervical spondylosis.  CT ABDOMEN PELVIS FINDINGS  Hepatobiliary: Posterior right hepatic lobe hemangioma, stable.  Pancreas: Unremarkable  Spleen: Punctate calcification in the lower spleen, compatible with remote granulomatous disease.  Adrenals/Urinary Tract: Unremarkable  Stomach/Bowel: Unremarkable  Vascular/Lymphatic: Aortoiliac atherosclerotic vascular disease.  Reproductive: Uterus absent.  Ovaries not well seen.  Other: No supplemental non-categorized findings.  Musculoskeletal: Grade 1 anterolisthesis at L4-5. Degenerative disc disease and endplate sclerosis at LJ6-R6with left foraminal stenosis at L4-5 and L5-S1.  IMPRESSION: 1. Improvement in size of the left lower lobe nodule, now 2.6 by 2.5 cm, previously 8 3.3 cm mass. 2. Scattered nodules in both lungs . Reduced in size. These have year is somewhat unusual feature of some peripheral sub solid appearance which can be seen in the setting of hemorrhagic lesions, and some may be minimally cavitary. These are likely malignant ;  some types of fungal pneumonia can manifest in a similar imaging fashion. 3. No findings of metastatic disease below the diaphragms. 4. Right posterior hepatic lobe hemangioma. 5. Left foraminal impingement at L4-5 and L5-S1 due to spondylosis and degenerative disc disease.   Electronically Signed   By: WVan ClinesM.D.   On: 07/01/2015 16:07    ASSESSMENT AND PLAN: This is a very pleasant 69years old white female recently diagnosed with a stage IV non-small cell lung cancer, adenocarcinoma with positive EGFR mutation with deletion in exon 19. The patient was started on treatment with Gilotrif 40 mg by mouth daily and tolerating it much better after start taking it at nighttime. She status post 2 months of treatment. The recent CT scan of the chest, abdomen and pelvis showed improvement of her disease. I discussed the scan results with the patient and her family. I recommended for her to continue her current treatment with Gilotrif with the same dose but she will hold her treatment for 5 days because of the elevated liver enzymes. We will repeat her compliance metabolic panel week and we will resume the treatment if her liver enzymes improved. The patient would come back for follow-up visit in one month's for reevaluation and repeat blood  work. She was advised to call immediately if she has any concerning symptoms in the interval. The patient voices understanding of current disease status and treatment options and is in agreement with the current care plan.  All questions were answered. The patient knows to call the clinic with any problems, questions or concerns. We can certainly see the patient much sooner if necessary.  I spent 20 minutes counseling the patient face to face. The total time spent in the appointment was 30 minutes.  Disclaimer: This note was dictated with voice recognition software. Similar sounding words can inadvertently be transcribed and may not be corrected upon  review.

## 2015-07-05 NOTE — Telephone Encounter (Signed)
per pof to sch pt appt-gave pt copy of avs °

## 2015-07-11 ENCOUNTER — Other Ambulatory Visit: Payer: Self-pay | Admitting: Internal Medicine

## 2015-07-12 ENCOUNTER — Other Ambulatory Visit (HOSPITAL_BASED_OUTPATIENT_CLINIC_OR_DEPARTMENT_OTHER): Payer: Medicare Other

## 2015-07-12 DIAGNOSIS — C3492 Malignant neoplasm of unspecified part of left bronchus or lung: Secondary | ICD-10-CM

## 2015-07-12 LAB — COMPREHENSIVE METABOLIC PANEL (CC13)
ALBUMIN: 3 g/dL — AB (ref 3.5–5.0)
ALK PHOS: 73 U/L (ref 40–150)
ALT: 92 U/L — AB (ref 0–55)
AST: 49 U/L — AB (ref 5–34)
Anion Gap: 5 mEq/L (ref 3–11)
BUN: 14.9 mg/dL (ref 7.0–26.0)
CALCIUM: 8.4 mg/dL (ref 8.4–10.4)
CO2: 28 mEq/L (ref 22–29)
CREATININE: 0.8 mg/dL (ref 0.6–1.1)
Chloride: 110 mEq/L — ABNORMAL HIGH (ref 98–109)
EGFR: 75 mL/min/{1.73_m2} — ABNORMAL LOW (ref >90)
Glucose: 77 mg/dl (ref 70–140)
Potassium: 3.6 mEq/L (ref 3.5–5.1)
Sodium: 143 mEq/L (ref 136–145)
Total Bilirubin: 0.94 mg/dL (ref 0.20–1.20)
Total Protein: 5.6 g/dL — ABNORMAL LOW (ref 6.4–8.3)

## 2015-07-12 LAB — CBC WITH DIFFERENTIAL/PLATELET
BASO%: 0.8 % (ref 0.0–2.0)
Basophils Absolute: 0 10*3/uL (ref 0.0–0.1)
EOS%: 7.7 % — AB (ref 0.0–7.0)
Eosinophils Absolute: 0.4 10*3/uL (ref 0.0–0.5)
HEMATOCRIT: 34.5 % — AB (ref 34.8–46.6)
HEMOGLOBIN: 11.8 g/dL (ref 11.6–15.9)
LYMPH#: 1 10*3/uL (ref 0.9–3.3)
LYMPH%: 21.7 % (ref 14.0–49.7)
MCH: 29.3 pg (ref 25.1–34.0)
MCHC: 34.3 g/dL (ref 31.5–36.0)
MCV: 85.5 fL (ref 79.5–101.0)
MONO#: 0.5 10*3/uL (ref 0.1–0.9)
MONO%: 11.6 % (ref 0.0–14.0)
NEUT%: 58.2 % (ref 38.4–76.8)
NEUTROS ABS: 2.7 10*3/uL (ref 1.5–6.5)
Platelets: 203 10*3/uL (ref 145–400)
RBC: 4.04 10*6/uL (ref 3.70–5.45)
RDW: 14.1 % (ref 11.2–14.5)
WBC: 4.7 10*3/uL (ref 3.9–10.3)

## 2015-07-13 ENCOUNTER — Telehealth: Payer: Self-pay | Admitting: *Deleted

## 2015-07-13 NOTE — Telephone Encounter (Signed)
Patient called in reference to labs drawn on yesterday.  "Should I refill the Gilotrif?"  Called Dr. Julien Nordmann reviewing lab results and liver enzymes.  Verbal order received and read back from Dr. Julien Nordmann for Ms. Hilley to go ahead and refill.  Order given to Ms. Nygard at this time.

## 2015-08-03 ENCOUNTER — Encounter: Payer: Self-pay | Admitting: Internal Medicine

## 2015-08-03 ENCOUNTER — Encounter: Payer: Self-pay | Admitting: Emergency Medicine

## 2015-08-04 ENCOUNTER — Telehealth: Payer: Self-pay | Admitting: Internal Medicine

## 2015-08-04 ENCOUNTER — Other Ambulatory Visit: Payer: Medicare Other

## 2015-08-04 ENCOUNTER — Ambulatory Visit (HOSPITAL_BASED_OUTPATIENT_CLINIC_OR_DEPARTMENT_OTHER): Payer: Medicare Other | Admitting: Internal Medicine

## 2015-08-04 ENCOUNTER — Encounter: Payer: Self-pay | Admitting: Internal Medicine

## 2015-08-04 ENCOUNTER — Other Ambulatory Visit (HOSPITAL_BASED_OUTPATIENT_CLINIC_OR_DEPARTMENT_OTHER): Payer: Medicare Other

## 2015-08-04 ENCOUNTER — Ambulatory Visit: Payer: Medicare Other | Admitting: Nurse Practitioner

## 2015-08-04 VITALS — BP 122/72 | HR 79 | Temp 97.7°F | Resp 18 | Ht 60.0 in | Wt 150.8 lb

## 2015-08-04 DIAGNOSIS — C7800 Secondary malignant neoplasm of unspecified lung: Secondary | ICD-10-CM | POA: Diagnosis not present

## 2015-08-04 DIAGNOSIS — R21 Rash and other nonspecific skin eruption: Secondary | ICD-10-CM | POA: Diagnosis not present

## 2015-08-04 DIAGNOSIS — C3490 Malignant neoplasm of unspecified part of unspecified bronchus or lung: Secondary | ICD-10-CM

## 2015-08-04 DIAGNOSIS — C3432 Malignant neoplasm of lower lobe, left bronchus or lung: Secondary | ICD-10-CM | POA: Diagnosis not present

## 2015-08-04 DIAGNOSIS — C3492 Malignant neoplasm of unspecified part of left bronchus or lung: Secondary | ICD-10-CM

## 2015-08-04 LAB — CBC WITH DIFFERENTIAL/PLATELET
BASO%: 0.8 % (ref 0.0–2.0)
Basophils Absolute: 0 10*3/uL (ref 0.0–0.1)
EOS%: 6.6 % (ref 0.0–7.0)
Eosinophils Absolute: 0.4 10*3/uL (ref 0.0–0.5)
HEMATOCRIT: 39.2 % (ref 34.8–46.6)
HGB: 12.9 g/dL (ref 11.6–15.9)
LYMPH#: 0.9 10*3/uL (ref 0.9–3.3)
LYMPH%: 16.1 % (ref 14.0–49.7)
MCH: 28.2 pg (ref 25.1–34.0)
MCHC: 33 g/dL (ref 31.5–36.0)
MCV: 85.5 fL (ref 79.5–101.0)
MONO#: 0.6 10*3/uL (ref 0.1–0.9)
MONO%: 11.3 % (ref 0.0–14.0)
NEUT%: 65.2 % (ref 38.4–76.8)
NEUTROS ABS: 3.6 10*3/uL (ref 1.5–6.5)
PLATELETS: 163 10*3/uL (ref 145–400)
RBC: 4.58 10*6/uL (ref 3.70–5.45)
RDW: 14.3 % (ref 11.2–14.5)
WBC: 5.5 10*3/uL (ref 3.9–10.3)

## 2015-08-04 LAB — COMPREHENSIVE METABOLIC PANEL (CC13)
ALK PHOS: 54 U/L (ref 40–150)
ALT: 74 U/L — AB (ref 0–55)
ANION GAP: 8 meq/L (ref 3–11)
AST: 42 U/L — ABNORMAL HIGH (ref 5–34)
Albumin: 3.4 g/dL — ABNORMAL LOW (ref 3.5–5.0)
BUN: 15.5 mg/dL (ref 7.0–26.0)
CALCIUM: 8.8 mg/dL (ref 8.4–10.4)
CO2: 25 meq/L (ref 22–29)
Chloride: 110 mEq/L — ABNORMAL HIGH (ref 98–109)
Creatinine: 0.8 mg/dL (ref 0.6–1.1)
EGFR: 71 mL/min/{1.73_m2} — ABNORMAL LOW (ref >90)
Glucose: 94 mg/dl (ref 70–140)
Potassium: 4.3 mEq/L (ref 3.5–5.1)
Sodium: 142 mEq/L (ref 136–145)
TOTAL PROTEIN: 6 g/dL — AB (ref 6.4–8.3)
Total Bilirubin: 1.41 mg/dL — ABNORMAL HIGH (ref 0.20–1.20)

## 2015-08-04 NOTE — Telephone Encounter (Signed)
per pof to sch pt appt-gave pt copy of sch °

## 2015-08-04 NOTE — Progress Notes (Signed)
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  PRIOR THERAPY: None  CURRENT THERAPY: Gilotrif 40 mg by mouth daily. First dose started on 05/11/2015. Status post 3 months of treatment.  INTERVAL HISTORY: Elizabeth [name] 69 y.o. female returns to the clinic today for follow-up visit accompanied by her husband. The patient is feeling fine today with no specific complaints. She has very mild skin rash and infrequent episodes of diarrhea but she is able to manage these adverse effect much better. She denied having any significant chest pain, cough or hemoptysis. She has no significant weight loss or night sweats. She denied having any significant nausea or vomiting, no fever or chills. She had repeat CT scan of the chest performed recently and she is here for evaluation and discussion of her scan results.  MEDICAL HISTORY: Past Medical History  Diagnosis Date  . Hypertension   . Hyperlipidemia   . Thyroid disease   . Prediabetes   . Allergy   . Anxiety   . Insomnia   . Cancer (HCC)     ALLERGIES:  is allergic to penicillins.  MEDICATIONS:  Current Outpatient Prescriptions  Medication Sig Dispense Refill  . afatinib dimaleate (GILOTRIF) 40 MG tablet Take 1 tablet (40 mg total) by mouth daily. Take on an empty stomach 1hr before or 2 hrs after meals. 30 tablet 2  . aspirin 81 MG tablet Take 81 mg by mouth at bedtime.     . cetirizine (ZYRTEC) 10 MG tablet Take 10 mg by mouth daily as needed for allergies.    . Cholecalciferol (VITAMIN D3) 10000 UNITS TABS Take by mouth.    .  clindamycin (CLEOCIN-T) 1 % external solution Apply topically 2 (two) times daily. To affected areas 30 mL 1  . HYDROMET 5-1.5 MG/5ML syrup TK 5ML PO EVERY 6 HOURS AS NEEDED FOR COUGH  0  . levothyroxine (SYNTHROID, LEVOTHROID) 50 MCG tablet TAKE 1 TABLET BY MOUTH EVERY DAY 90 tablet 1  . montelukast (SINGULAIR) 10 MG tablet TAKE 1 TABLET BY MOUTH EVERY DAY (Patient taking differently: TAKE 1 TABLET BY MOUTH DAILY AT BEDTIME) 30 tablet 3  . ondansetron (ZOFRAN) 8 MG tablet Take 1 tablet (8 mg total) by mouth every 8 (eight) hours as needed for nausea or vomiting. 20 tablet 0  . simvastatin (ZOCOR) 20 MG tablet TK 1 T PO  QHS  99  . triamterene-hydrochlorothiazide (MAXZIDE) 75-50 MG tablet TK 1 T PO QD  1   No current facility-administered medications for this visit.    SURGICAL HISTORY:  Past Surgical History  Procedure Laterality Date  . Tonsillectomy and adenoidectomy    . Abdominal hysterectomy  1995    w BSO    REVIEW OF SYSTEMS:  A comprehensive review of systems was negative except for: Gastrointestinal: positive for diarrhea Integument/breast: positive for rash   PHYSICAL EXAMINATION: General appearance: alert, cooperative and no distress Head: Normocephalic, without obvious abnormality, atraumatic Neck: no adenopathy, no JVD, supple, symmetrical, trachea midline and thyroid not enlarged, symmetric, no tenderness/mass/nodules Lymph nodes: Cervical, supraclavicular, and axillary nodes normal. Resp: clear to auscultation bilaterally Back:  symmetric, no curvature. ROM normal. No CVA tenderness. Cardio: regular rate and rhythm, S1, S2 normal, no murmur, click, rub or gallop GI: soft, non-tender; bowel sounds normal; no masses,  no organomegaly Extremities: extremities normal, atraumatic, no cyanosis or edema Neurologic: Alert and oriented X 3, normal strength and tone. Normal symmetric reflexes. Normal coordination and gait  ECOG PERFORMANCE STATUS: 0 - Asymptomatic  Blood  pressure 122/72, pulse 79, temperature 97.7 F (36.5 C), temperature source Oral, resp. rate 18, height 5' (1.524 m), weight 150 lb 12.8 oz (68.402 kg), SpO2 99 %.  LABORATORY DATA: Lab Results  Component Value Date   WBC 5.5 08/04/2015   HGB 12.9 08/04/2015   HCT 39.2 08/04/2015   MCV 85.5 08/04/2015   PLT 163 08/04/2015      Chemistry      Component Value Date/Time   NA 143 07/12/2015 1104   NA 140 06/11/2015 1208   K 3.6 07/12/2015 1104   K 3.9 06/11/2015 1208   CL 107 06/11/2015 1208   CO2 28 07/12/2015 1104   CO2 23 06/11/2015 1208   BUN 14.9 07/12/2015 1104   BUN 13 06/11/2015 1208   CREATININE 0.8 07/12/2015 1104   CREATININE 0.92 06/11/2015 1208   CREATININE 1.17* 05/13/2015 1855      Component Value Date/Time   CALCIUM 8.4 07/12/2015 1104   CALCIUM 8.3* 06/11/2015 1208   ALKPHOS 73 07/12/2015 1104   ALKPHOS 96 06/11/2015 1208   AST 49* 07/12/2015 1104   AST 48* 06/11/2015 1208   ALT 92* 07/12/2015 1104   ALT 71* 06/11/2015 1208   BILITOT 0.94 07/12/2015 1104   BILITOT 1.3* 06/11/2015 1208       RADIOGRAPHIC STUDIES: No results found.  ASSESSMENT AND PLAN: This is a very pleasant 69 years old white female recently diagnosed with a stage IV non-small cell lung cancer, adenocarcinoma with positive EGFR mutation with deletion in exon 19. The patient was started on treatment with Gilotrif 40 mg by mouth daily and tolerating it much better after start taking it at nighttime. She status post 3 months of treatment.  The patient has no significant complaints today. I recommended for her to continue her current treatment with Gilotrif as a scheduled. The patient would come back for follow-up visit in one month for reevaluation and repeat blood work. She was advised to call immediately if she has any concerning symptoms in the interval. The patient voices understanding of current disease status and treatment options and is in agreement with the current care  plan.  All questions were answered. The patient knows to call the clinic with any problems, questions or concerns. We can certainly see the patient much sooner if necessary.  Disclaimer: This note was dictated with voice recognition software. Similar sounding words can inadvertently be transcribed and may not be corrected upon review.

## 2015-08-09 ENCOUNTER — Other Ambulatory Visit: Payer: Self-pay | Admitting: Internal Medicine

## 2015-08-13 ENCOUNTER — Other Ambulatory Visit: Payer: Self-pay | Admitting: Physician Assistant

## 2015-08-16 ENCOUNTER — Encounter: Payer: Self-pay | Admitting: Internal Medicine

## 2015-08-18 ENCOUNTER — Other Ambulatory Visit: Payer: Self-pay | Admitting: Internal Medicine

## 2015-08-18 DIAGNOSIS — F411 Generalized anxiety disorder: Secondary | ICD-10-CM

## 2015-09-13 ENCOUNTER — Telehealth: Payer: Self-pay | Admitting: Internal Medicine

## 2015-09-13 ENCOUNTER — Encounter: Payer: Self-pay | Admitting: Internal Medicine

## 2015-09-13 ENCOUNTER — Ambulatory Visit (HOSPITAL_BASED_OUTPATIENT_CLINIC_OR_DEPARTMENT_OTHER): Payer: Medicare Other | Admitting: Internal Medicine

## 2015-09-13 ENCOUNTER — Other Ambulatory Visit (HOSPITAL_BASED_OUTPATIENT_CLINIC_OR_DEPARTMENT_OTHER): Payer: Medicare Other

## 2015-09-13 VITALS — BP 150/70 | HR 80 | Temp 98.1°F | Resp 18 | Ht 60.0 in | Wt 148.5 lb

## 2015-09-13 DIAGNOSIS — C3490 Malignant neoplasm of unspecified part of unspecified bronchus or lung: Secondary | ICD-10-CM

## 2015-09-13 DIAGNOSIS — C3492 Malignant neoplasm of unspecified part of left bronchus or lung: Secondary | ICD-10-CM

## 2015-09-13 LAB — CBC WITH DIFFERENTIAL/PLATELET
BASO%: 0.9 % (ref 0.0–2.0)
Basophils Absolute: 0.1 10*3/uL (ref 0.0–0.1)
EOS%: 3.4 % (ref 0.0–7.0)
Eosinophils Absolute: 0.2 10*3/uL (ref 0.0–0.5)
HCT: 36.1 % (ref 34.8–46.6)
HGB: 12 g/dL (ref 11.6–15.9)
LYMPH%: 15.8 % (ref 14.0–49.7)
MCH: 28.1 pg (ref 25.1–34.0)
MCHC: 33.3 g/dL (ref 31.5–36.0)
MCV: 84.3 fL (ref 79.5–101.0)
MONO#: 0.7 10*3/uL (ref 0.1–0.9)
MONO%: 11.8 % (ref 0.0–14.0)
NEUT#: 4 10*3/uL (ref 1.5–6.5)
NEUT%: 68.1 % (ref 38.4–76.8)
PLATELETS: 206 10*3/uL (ref 145–400)
RBC: 4.28 10*6/uL (ref 3.70–5.45)
RDW: 13.7 % (ref 11.2–14.5)
WBC: 5.9 10*3/uL (ref 3.9–10.3)
lymph#: 0.9 10*3/uL (ref 0.9–3.3)

## 2015-09-13 LAB — COMPREHENSIVE METABOLIC PANEL (CC13)
ALT: 47 U/L (ref 0–55)
ANION GAP: 9 meq/L (ref 3–11)
AST: 30 U/L (ref 5–34)
Albumin: 3 g/dL — ABNORMAL LOW (ref 3.5–5.0)
Alkaline Phosphatase: 84 U/L (ref 40–150)
BUN: 12.2 mg/dL (ref 7.0–26.0)
CHLORIDE: 107 meq/L (ref 98–109)
CO2: 24 meq/L (ref 22–29)
Calcium: 8.6 mg/dL (ref 8.4–10.4)
Creatinine: 0.8 mg/dL (ref 0.6–1.1)
EGFR: 79 mL/min/{1.73_m2} — AB (ref >90)
Glucose: 84 mg/dl (ref 70–140)
Potassium: 3.6 mEq/L (ref 3.5–5.1)
SODIUM: 140 meq/L (ref 136–145)
Total Bilirubin: 0.82 mg/dL (ref 0.20–1.20)
Total Protein: 5.6 g/dL — ABNORMAL LOW (ref 6.4–8.3)

## 2015-09-13 NOTE — Progress Notes (Signed)
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  PRIOR THERAPY: None  CURRENT THERAPY: Gilotrif 40 mg by mouth daily. First dose started on 05/11/2015. Status post 4 months of treatment.  INTERVAL HISTORY: Elizabeth [name] 69 y.o. female returns to the clinic today for follow-up visit accompanied by her husband. The patient is feeling fine today with no specific complaints. She has no significant skin rash or diarrhea. She denied having any significant chest pain, cough or hemoptysis. She has no significant weight loss or night sweats. She denied having any significant nausea or vomiting, no fever or chills.   MEDICAL HISTORY: Past Medical History  Diagnosis Date  . Hypertension   . Hyperlipidemia   . Thyroid disease   . Prediabetes   . Allergy   . Anxiety   . Insomnia   . Cancer (HCC)     ALLERGIES:  is allergic to penicillins.  MEDICATIONS:  Current Outpatient Prescriptions  Medication Sig Dispense Refill  . ALPRAZolam (XANAX) 1 MG tablet Take 1/2 to 1 tablet 3 x day if needed for anxiety 90 tablet 5  . aspirin 81 MG tablet Take 81 mg by mouth at bedtime.     Marland Kitchen azithromycin (ZITHROMAX) 250 MG tablet   0  . cetirizine (ZYRTEC) 10 MG tablet Take 10 mg by mouth daily as needed for allergies.    . Cholecalciferol (VITAMIN D3) 10000 UNITS TABS Take by mouth.    . clindamycin (CLEOCIN-T) 1 % external solution Apply topically 2 (two) times daily. To affected areas 30 mL 1  . FLUZONE HIGH-DOSE 0.5 ML SUSY ADM 0.5ML IM UTD  0  . GILOTRIF 40 MG tablet TAKE 1 TABLET BY MOUTH ONCE DAILY.  TAKE ON AN EMPTY STOMACH 1 HOUR BEFORE OR 2 HOURS AFTER MEALS 30 tablet 2  . HYDROMET 5-1.5 MG/5ML syrup TK 5ML PO EVERY 6 HOURS AS NEEDED FOR COUGH  0  . levothyroxine (SYNTHROID, LEVOTHROID) 50 MCG tablet TAKE 1 TABLET BY MOUTH EVERY DAY 90 tablet 1  . montelukast (SINGULAIR) 10 MG tablet TAKE 1 TABLET BY MOUTH EVERY DAY 90 tablet 0  . ondansetron (ZOFRAN) 8 MG tablet Take 1 tablet (8 mg total) by mouth every 8 (eight) hours as needed for nausea or vomiting. 20 tablet 0   No current facility-administered medications for this visit.    SURGICAL HISTORY:  Past Surgical History  Procedure Laterality Date  . Tonsillectomy and adenoidectomy    . Abdominal hysterectomy  1995    w BSO    REVIEW OF SYSTEMS:  A comprehensive review of systems was negative.   PHYSICAL EXAMINATION: General appearance: alert, cooperative and no distress Head: Normocephalic, without obvious abnormality, atraumatic Neck: no adenopathy, no JVD, supple, symmetrical, trachea midline and thyroid not enlarged, symmetric, no tenderness/mass/nodules Lymph nodes: Cervical, supraclavicular, and axillary nodes normal. Resp: clear to auscultation bilaterally Back: symmetric, no curvature. ROM normal. No CVA tenderness. Cardio: regular rate and rhythm, S1, S2 normal, no murmur, click, rub or gallop GI: soft, non-tender; bowel sounds normal; no masses,  no organomegaly Extremities: extremities normal, atraumatic, no cyanosis or edema Neurologic: Alert  and oriented X 3, normal strength and tone. Normal symmetric reflexes. Normal coordination and gait  ECOG PERFORMANCE STATUS: 0 - Asymptomatic  Blood pressure 150/70, pulse 80, temperature 98.1 F (36.7 C), temperature source Oral, resp. rate 18, height 5' (1.524 m), weight 148 lb 8 oz (67.359 kg), SpO2 100 %.  LABORATORY DATA: Lab Results  Component Value Date   WBC 5.9 09/13/2015   HGB 12.0 09/13/2015   HCT 36.1 09/13/2015   MCV 84.3 09/13/2015   PLT 206 09/13/2015        Chemistry      Component Value Date/Time   NA 142 08/04/2015 0937   NA 140 06/11/2015 1208   K 4.3 08/04/2015 0937   K 3.9 06/11/2015 1208   CL 107 06/11/2015 1208   CO2 25 08/04/2015 0937   CO2 23 06/11/2015 1208   BUN 15.5 08/04/2015 0937   BUN 13 06/11/2015 1208   CREATININE 0.8 08/04/2015 0937   CREATININE 0.92 06/11/2015 1208   CREATININE 1.17* 05/13/2015 1855      Component Value Date/Time   CALCIUM 8.8 08/04/2015 0937   CALCIUM 8.3* 06/11/2015 1208   ALKPHOS 54 08/04/2015 0937   ALKPHOS 96 06/11/2015 1208   AST 42* 08/04/2015 0937   AST 48* 06/11/2015 1208   ALT 74* 08/04/2015 0937   ALT 71* 06/11/2015 1208   BILITOT 1.41* 08/04/2015 0937   BILITOT 1.3* 06/11/2015 1208       RADIOGRAPHIC STUDIES: No results found.  ASSESSMENT AND PLAN: This is a very pleasant 69 years old white female recently diagnosed with a stage IV non-small cell lung cancer, adenocarcinoma with positive EGFR mutation with deletion in exon 19. The patient was started on treatment with Gilotrif 40 mg by mouth daily and tolerating it much better after start taking it at nighttime. She status post 4 months of treatment.  The patient has no significant complaints today. I recommended for her to continue her current treatment with Gilotrif as a scheduled. The patient would come back for follow-up visit in one month for reevaluation and repeat blood work as well as CT scan of the chest, abdomen and pelvis. She was advised to call immediately if she has any concerning symptoms in the interval. The patient voices understanding of current disease status and treatment options and is in agreement with the current care plan.  All questions were answered. The patient knows to call the clinic with any problems, questions or concerns. We can certainly see the patient much sooner if necessary.  Disclaimer: This note was dictated with voice recognition software. Similar sounding words can inadvertently be  transcribed and may not be corrected upon review.

## 2015-09-13 NOTE — Telephone Encounter (Signed)
Gave and printed appt sched and avs for pt for DEC....gv barium

## 2015-09-21 ENCOUNTER — Encounter: Payer: Self-pay | Admitting: Internal Medicine

## 2015-09-21 ENCOUNTER — Ambulatory Visit (INDEPENDENT_AMBULATORY_CARE_PROVIDER_SITE_OTHER): Payer: Medicare Other | Admitting: Internal Medicine

## 2015-09-21 VITALS — BP 140/72 | HR 88 | Temp 97.8°F | Resp 16 | Ht 60.0 in | Wt 147.0 lb

## 2015-09-21 DIAGNOSIS — Z1212 Encounter for screening for malignant neoplasm of rectum: Secondary | ICD-10-CM

## 2015-09-21 DIAGNOSIS — Z79899 Other long term (current) drug therapy: Secondary | ICD-10-CM

## 2015-09-21 DIAGNOSIS — E039 Hypothyroidism, unspecified: Secondary | ICD-10-CM

## 2015-09-21 DIAGNOSIS — R7303 Prediabetes: Secondary | ICD-10-CM

## 2015-09-21 DIAGNOSIS — Z0000 Encounter for general adult medical examination without abnormal findings: Secondary | ICD-10-CM | POA: Diagnosis not present

## 2015-09-21 DIAGNOSIS — E559 Vitamin D deficiency, unspecified: Secondary | ICD-10-CM

## 2015-09-21 DIAGNOSIS — Z130 Encounter for screening for diseases of the blood and blood-forming organs and certain disorders involving the immune mechanism: Secondary | ICD-10-CM

## 2015-09-21 DIAGNOSIS — I10 Essential (primary) hypertension: Secondary | ICD-10-CM | POA: Diagnosis not present

## 2015-09-21 DIAGNOSIS — Z0001 Encounter for general adult medical examination with abnormal findings: Secondary | ICD-10-CM

## 2015-09-21 DIAGNOSIS — E782 Mixed hyperlipidemia: Secondary | ICD-10-CM

## 2015-09-21 DIAGNOSIS — E669 Obesity, unspecified: Secondary | ICD-10-CM

## 2015-09-21 LAB — HEPATIC FUNCTION PANEL
ALK PHOS: 71 U/L (ref 33–130)
ALT: 54 U/L — AB (ref 6–29)
AST: 33 U/L (ref 10–35)
Albumin: 3.9 g/dL (ref 3.6–5.1)
BILIRUBIN DIRECT: 0.2 mg/dL (ref <=0.2)
BILIRUBIN INDIRECT: 0.9 mg/dL (ref 0.2–1.2)
Total Bilirubin: 1.1 mg/dL (ref 0.2–1.2)
Total Protein: 6.2 g/dL (ref 6.1–8.1)

## 2015-09-21 LAB — BASIC METABOLIC PANEL WITH GFR
BUN: 18 mg/dL (ref 7–25)
CALCIUM: 8.9 mg/dL (ref 8.6–10.4)
CHLORIDE: 103 mmol/L (ref 98–110)
CO2: 28 mmol/L (ref 20–31)
Creat: 0.94 mg/dL (ref 0.50–0.99)
GFR, EST NON AFRICAN AMERICAN: 63 mL/min (ref >=60)
GFR, Est African American: 72 mL/min (ref >=60)
GLUCOSE: 79 mg/dL (ref 65–99)
POTASSIUM: 4.2 mmol/L (ref 3.5–5.3)
Sodium: 138 mmol/L (ref 135–146)

## 2015-09-21 LAB — CBC WITH DIFFERENTIAL/PLATELET
Basophils Absolute: 0.1 10*3/uL (ref 0.0–0.1)
Basophils Relative: 1 % (ref 0–1)
EOS PCT: 3 % (ref 0–5)
Eosinophils Absolute: 0.2 10*3/uL (ref 0.0–0.7)
HEMATOCRIT: 40.3 % (ref 36.0–46.0)
HEMOGLOBIN: 13.1 g/dL (ref 12.0–15.0)
LYMPHS PCT: 13 % (ref 12–46)
Lymphs Abs: 1 10*3/uL (ref 0.7–4.0)
MCH: 27.9 pg (ref 26.0–34.0)
MCHC: 32.5 g/dL (ref 30.0–36.0)
MCV: 85.7 fL (ref 78.0–100.0)
MONO ABS: 0.6 10*3/uL (ref 0.1–1.0)
MONOS PCT: 8 % (ref 3–12)
MPV: 11.7 fL (ref 8.6–12.4)
Neutro Abs: 5.9 10*3/uL (ref 1.7–7.7)
Neutrophils Relative %: 75 % (ref 43–77)
Platelets: 216 10*3/uL (ref 150–400)
RBC: 4.7 MIL/uL (ref 3.87–5.11)
RDW: 13.5 % (ref 11.5–15.5)
WBC: 7.9 10*3/uL (ref 4.0–10.5)

## 2015-09-21 LAB — MAGNESIUM: Magnesium: 1.7 mg/dL (ref 1.5–2.5)

## 2015-09-21 LAB — IRON AND TIBC
%SAT: 20 % (ref 11–50)
IRON: 62 ug/dL (ref 45–160)
TIBC: 304 ug/dL (ref 250–450)
UIBC: 242 ug/dL (ref 125–400)

## 2015-09-21 LAB — LIPID PANEL
Cholesterol: 142 mg/dL (ref 125–200)
HDL: 38 mg/dL — AB (ref >=46)
LDL Cholesterol: 75 mg/dL (ref <130)
Total CHOL/HDL Ratio: 3.7 Ratio (ref <=5.0)
Triglycerides: 144 mg/dL (ref <150)
VLDL: 29 mg/dL (ref <30)

## 2015-09-21 LAB — TSH: TSH: 1.927 u[IU]/mL (ref 0.350–4.500)

## 2015-09-21 LAB — VITAMIN B12: VITAMIN B 12: 370 pg/mL (ref 211–911)

## 2015-09-21 MED ORDER — DIAZEPAM 5 MG PO TABS: 5.0000 mg | ORAL_TABLET | Freq: Every day | ORAL | Status: DC

## 2015-09-21 NOTE — Progress Notes (Signed)
Patient ID: Elizabeth [name], female   DOB: May 12, 1946, 69 y.o.   MRN: [id number]  Complete Physical  Assessment and Plan:   1. Essential hypertension  - Urinalysis, Routine w reflex microscopic (not at [facility]) - Microalbumin / creatinine urine ratio - EKG 12-Lead - TSH  2. Hypothyroidism, unspecified hypothyroidism type -cont levothyroxine  3. Prediabetes  - Hemoglobin A1c - Insulin, random  4. Hyperlipidemia  - Lipid panel  5. Vitamin D deficiency -cont supplementation - VITAMIN D 25 Hydroxy (Vit-D Deficiency, Fractures)  6. Medication management  - CBC with Differential/Platelet - BASIC METABOLIC PANEL WITH GFR - Hepatic function panel - Magnesium  7. Obesity -cont diet and exercise  8. Encounter for general adult medical examination with abnormal findings   9. Screening for rectal cancer  - POC Hemoccult Bld/Stl (3-Cd Home Screen); Future  10. Screening for deficiency anemia  - Iron and TIBC - Vitamin B12  Needs prevnar 13 vaccine however we are currently out of stock of this vaccine.  She has declined it today and would like to have it back when she is done with her trip this weekend.    Discussed med's effects and SE's. Screening labs and tests as requested with regular follow-up as recommended.  HPI  69 y.o. female  presents for a complete physical. She has been diagnosed with Stage 4 adenocarcinoma of the lungs with metastatic disease.  She reports that she is currently still under the care of Dr. Julien Nordmann and she has been taking oral chemotherapy.  She is on her fifth cycle of oral chemotherapy.  She reports that she is tolerating the medication well but does get some diarrhea and occasional nausea with it.   Her blood pressure has been controlled at home, today their BP is BP: 140/72 mmHg.  She does not workout. She denies chest pain, shortness of breath, dizziness.   She is not on cholesterol medication and denies myalgias. Her cholesterol is at goal.  The cholesterol last visit was:  Lab Results  Component Value Date   CHOL 119* 06/11/2015   HDL 25* 06/11/2015   LDLCALC 77 06/11/2015   TRIG 83 06/11/2015   CHOLHDL 4.8 06/11/2015  .  She has been working on diet and exercise for prediabetes, she is on bASA, she is not on ACE/ARB and denies foot ulcerations, hyperglycemia, hypoglycemia , increased appetite, nausea, paresthesia of the feet, polydipsia, polyuria, visual disturbances, vomiting and weight loss. Last A1C in the office was:  Lab Results  Component Value Date   HGBA1C 5.8* 06/11/2015    Patient is on Vitamin D supplement.   Lab Results  Component Value Date   VD25OH 34 06/11/2015     She does have some issues with insomnia.  She was previously on [place] and would like to have something to help her sleep.    Current Medications:  Current Outpatient Prescriptions on File Prior to Visit  Medication Sig Dispense Refill  . aspirin 81 MG tablet Take 81 mg by mouth at bedtime.     . Cholecalciferol (VITAMIN D3) 10000 UNITS TABS Take by mouth.    Marland Kitchen GILOTRIF 40 MG tablet TAKE 1 TABLET BY MOUTH ONCE DAILY. TAKE ON AN EMPTY STOMACH 1 HOUR BEFORE OR 2 HOURS AFTER MEALS 30 tablet 2  . levothyroxine (SYNTHROID, LEVOTHROID) 50 MCG tablet TAKE 1 TABLET BY MOUTH EVERY DAY 90 tablet 1  . montelukast (SINGULAIR) 10 MG tablet TAKE 1 TABLET BY MOUTH EVERY DAY 90 tablet 0  .  ALPRAZolam (XANAX) 1 MG tablet Take 1/2 to 1 tablet 3 x day if needed for anxiety (Patient not taking: Reported on 09/21/2015) 90 tablet 5   No current facility-administered medications on file prior to visit.    Health Maintenance:   Immunization History  Administered Date(s) Administered  . Influenza, High Dose Seasonal PF 07/31/2014  . Influenza-Unspecified 09/13/2015  . Pneumococcal Polysaccharide-23 07/22/2012  . Td 12/25/2005  . Zoster 01/09/2007    Tetanus: UTD Pneumovax: 2013 Flu vaccine: 09/13/15 Zostavax: 2008 MGM: Due, will schedule DEXA: defer  until cancer treated. Colonoscopy: 2014 Last Dental Exam: Dr. Lovena Le, twice yearly Last Eye Exam: Dr. Earl Gala  Patient Care Team: [name], MD as PCP - General (Internal Medicine) Sharol Roussel, OD as Physician Assistant (Optometry) Jettie Booze, MD as Consulting Physician (Interventional Cardiology) Teena Irani, MD as Consulting Physician (Gastroenterology) Druscilla Brownie, MD as Consulting Physician (Dermatology) Clent Jacks, MD as Consulting Physician (Ophthalmology) Erroll Luna, MD as Consulting Physician (General Surgery)  Allergies:  Allergies  Allergen Reactions  . Penicillins Swelling and Rash    Medical History:  Past Medical History  Diagnosis Date  . Hypertension   . Hyperlipidemia   . Thyroid disease   . Prediabetes   . Allergy   . Anxiety   . Insomnia   . Cancer [facility])     Surgical History:  Past Surgical History  Procedure Laterality Date  . Tonsillectomy and adenoidectomy    . Abdominal hysterectomy  1995    w BSO    Family History:  Family History  Problem Relation Age of Onset  . Liver disease Mother   . Alcohol abuse Mother   . ALS Father   . Hypertension Father     Social History:  Social History  Substance Use Topics  . Smoking status: Former Smoker -- 0.50 packs/day for 10 years    Quit date: 10/23/1974  . Smokeless tobacco: Never Used  . Alcohol Use: No     Comment: Rare    Review of Systems: Review of Systems  Constitutional: Negative for fever and chills.  HENT: Negative for congestion, ear pain and sore throat.   Eyes: Negative.   Respiratory: Negative for cough, shortness of breath and wheezing.   Cardiovascular: Negative for chest pain, palpitations and leg swelling.  Gastrointestinal: Positive for diarrhea. Negative for heartburn, constipation, blood in stool and melena.  Genitourinary: Negative.   Neurological: Negative for dizziness, sensory change, loss of consciousness and headaches.   Psychiatric/Behavioral: Negative for depression. The patient has insomnia. The patient is not nervous/anxious.     Physical Exam: Estimated body mass index is 28.71 kg/(m^2) as calculated from the following:   Height as of this encounter: 5' (1.524 m).   Weight as of this encounter: 147 lb (66.679 kg). BP 140/72 mmHg  Pulse 88  Temp(Src) 97.8 F (36.6 C) (Temporal)  Resp 16  Ht 5' (1.524 m)  Wt 147 lb (66.679 kg)  BMI 28.71 kg/m2  General Appearance: Well nourished well developed, in no apparent distress.  Eyes: PERRLA, EOMs, conjunctiva no swelling or erythema ENT/Mouth: Ear canals normal without obstruction, swelling, erythema, or discharge.  TMs normal bilaterally with no erythema, bulging, retraction, or loss of landmark.  Oropharynx moist and clear with no exudate, erythema, or swelling.   Neck: Supple, thyroid normal. No bruits.  No cervical adenopathy Respiratory: Respiratory effort normal, Breath sounds clear A&P without wheeze, rhonchi, rales.   Cardio: RRR without murmurs, rubs or gallops. Brisk peripheral pulses without  edema.  Chest: symmetric, with normal excursions Breasts: Symmetric, dense fibrocystic tissue, no nipple discharge, retractions.  Abdomen: Soft, nontender, no guarding, rebound, hernias, masses, or organomegaly.  Lymphatics: Non tender without lymphadenopathy.  Musculoskeletal: Full ROM all peripheral extremities,5/5 strength, and normal gait.  Skin: Warm, dry without rashes, lesions, ecchymosis. Neuro: Awake and oriented X 3, Cranial nerves intact, reflexes equal bilaterally. Normal muscle tone, no cerebellar symptoms. Sensation intact.  Psych:  normal affect, Insight and Judgment appropriate.   EKG: WNL no changes.  Over 40 minutes of exam, counseling, chart review and critical decision making was performed  Loma Sousa Forcucci 2:25 PM [facility]

## 2015-09-21 NOTE — Patient Instructions (Signed)
Preventive Care for Adults  A healthy lifestyle and preventive care can promote health and wellness. Preventive health guidelines for women include the following key practices.  A routine yearly physical is a good way to check with your health care provider about your health and preventive screening. It is a chance to share any concerns and updates on your health and to receive a thorough exam.  Visit your dentist for a routine exam and preventive care every 6 months. Brush your teeth twice a day and floss once a day. Good oral hygiene prevents tooth decay and gum disease.  The frequency of eye exams is based on your age, health, family medical history, use of contact lenses, and other factors. Follow your health care provider's recommendations for frequency of eye exams.  Eat a healthy diet. Foods like vegetables, fruits, whole grains, low-fat dairy products, and lean protein foods contain the nutrients you need without too many calories. Decrease your intake of foods high in solid fats, added sugars, and salt. Eat the right amount of calories for you.Get information about a proper diet from your health care provider, if necessary.  Regular physical exercise is one of the most important things you can do for your health. Most adults should get at least 150 minutes of moderate-intensity exercise (any activity that increases your heart rate and causes you to sweat) each week. In addition, most adults need muscle-strengthening exercises on 2 or more days a week.  Maintain a healthy weight. The body mass index (BMI) is a screening tool to identify possible weight problems. It provides an estimate of body fat based on height and weight. Your health care provider can find your BMI and can help you achieve or maintain a healthy weight.For adults 20 years and older:  A BMI below 18.5 is considered underweight.  A BMI of 18.5 to 24.9 is normal.  A BMI of 25 to 29.9 is considered overweight.  A BMI  of 30 and above is considered obese.  Maintain normal blood lipids and cholesterol levels by exercising and minimizing your intake of saturated fat. Eat a balanced diet with plenty of fruit and vegetables. If your lipid or cholesterol levels are high, you are over 50, or you are at high risk for heart disease, you may need your cholesterol levels checked more frequently.Ongoing high lipid and cholesterol levels should be treated with medicines if diet and exercise are not working.  If you smoke, find out from your health care provider how to quit. If you do not use tobacco, do not start.  Lung cancer screening is recommended for adults aged 2-80 years who are at high risk for developing lung cancer because of a history of smoking. A yearly low-dose CT scan of the lungs is recommended for people who have at least a 30-pack-year history of smoking and are a current smoker or have quit within the past 15 years. A pack year of smoking is smoking an average of 1 pack of cigarettes a day for 1 year (for example: 1 pack a day for 30 years or 2 packs a day for 15 years). Yearly screening should continue until the smoker has stopped smoking for at least 15 years. Yearly screening should be stopped for people who develop a health problem that would prevent them from having lung cancer treatment.  Avoid use of street drugs. Do not share needles with anyone. Ask for help if you need support or instructions about stopping the use of drugs.  High  blood pressure causes heart disease and increases the risk of stroke.  Ongoing high blood pressure should be treated with medicines if weight loss and exercise do not work.  If you are 55-79 years old, ask your health care provider if you should take aspirin to prevent strokes.  Diabetes screening involves taking a blood sample to check your fasting blood sugar level. This should be done once every 3 years, after age 45, if you are within normal weight and without risk  factors for diabetes. Testing should be considered at a younger age or be carried out more frequently if you are overweight and have at least 1 risk factor for diabetes.  Breast cancer screening is essential preventive care for women. You should practice "breast self-awareness." This means understanding the normal appearance and feel of your breasts and may include breast self-examination. Any changes detected, no matter how small, should be reported to a health care provider. Women in their 20s and 30s should have a clinical breast exam (CBE) by a health care provider as part of a regular health exam every 1 to 3 years. After age 40, women should have a CBE every year. Starting at age 40, women should consider having a mammogram (breast X-ray test) every year. Women who have a family history of breast cancer should talk to their health care provider about genetic screening. Women at a high risk of breast cancer should talk to their health care providers about having an MRI and a mammogram every year.  Breast cancer gene (BRCA)-related cancer risk assessment is recommended for women who have family members with BRCA-related cancers. BRCA-related cancers include breast, ovarian, tubal, and peritoneal cancers. Having family members with these cancers may be associated with an increased risk for harmful changes (mutations) in the breast cancer genes BRCA1 and BRCA2. Results of the assessment will determine the need for genetic counseling and BRCA1 and BRCA2 testing.  Routine pelvic exams to screen for cancer are no longer recommended for nonpregnant women who are considered low risk for cancer of the pelvic organs (ovaries, uterus, and vagina) and who do not have symptoms. Ask your health care provider if a screening pelvic exam is right for you.  If you have had past treatment for cervical cancer or a condition that could lead to cancer, you need Pap tests and screening for cancer for at least 20 years after  your treatment. If Pap tests have been discontinued, your risk factors (such as having a new sexual partner) need to be reassessed to determine if screening should be resumed. Some women have medical problems that increase the chance of getting cervical cancer. In these cases, your health care provider may recommend more frequent screening and Pap tests.    Colorectal cancer can be detected and often prevented. Most routine colorectal cancer screening begins at the age of 50 years and continues through age 75 years. However, your health care provider may recommend screening at an earlier age if you have risk factors for colon cancer. On a yearly basis, your health care provider may provide home test kits to check for hidden blood in the stool. Use of a small camera at the end of a tube, to directly examine the colon (sigmoidoscopy or colonoscopy), can detect the earliest forms of colorectal cancer. Talk to your health care provider about this at age 50, when routine screening begins. Direct exam of the colon should be repeated every 5-10 years through age 75 years, unless early forms of pre-cancerous   polyps or small growths are found.  Osteoporosis is a disease in which the bones lose minerals and strength with aging. This can result in serious bone fractures or breaks. The risk of osteoporosis can be identified using a bone density scan. Women ages 68 years and over and women at risk for fractures or osteoporosis should discuss screening with their health care providers. Ask your health care provider whether you should take a calcium supplement or vitamin D to reduce the rate of osteoporosis.  Menopause can be associated with physical symptoms and risks. Hormone replacement therapy is available to decrease symptoms and risks. You should talk to your health care provider about whether hormone replacement therapy is right for you.  Use sunscreen. Apply sunscreen liberally and repeatedly throughout the day.  You should seek shade when your shadow is shorter than you. Protect yourself by wearing long sleeves, pants, a wide-brimmed hat, and sunglasses year round, whenever you are outdoors.  Once a month, do a whole body skin exam, using a mirror to look at the skin on your back. Tell your health care provider of new moles, moles that have irregular borders, moles that are larger than a pencil eraser, or moles that have changed in shape or color.  Stay current with required vaccines (immunizations).  Influenza vaccine. All adults should be immunized every year.  Tetanus, diphtheria, and acellular pertussis (Td, Tdap) vaccine. Pregnant women should receive 1 dose of Tdap vaccine during each pregnancy. The dose should be obtained regardless of the length of time since the last dose. Immunization is preferred during the 27th-36th week of gestation. An adult who has not previously received Tdap or who does not know her vaccine status should receive 1 dose of Tdap. This initial dose should be followed by tetanus and diphtheria toxoids (Td) booster doses every 10 years. Adults with an unknown or incomplete history of completing a 3-dose immunization series with Td-containing vaccines should begin or complete a primary immunization series including a Tdap dose. Adults should receive a Td booster every 10 years.    Zoster vaccine. One dose is recommended for adults aged 36 years or older unless certain conditions are present.    Pneumococcal 13-valent conjugate (PCV13) vaccine. When indicated, a person who is uncertain of her immunization history and has no record of immunization should receive the PCV13 vaccine. An adult aged 1 years or older who has certain medical conditions and has not been previously immunized should receive 1 dose of PCV13 vaccine. This PCV13 should be followed with a dose of pneumococcal polysaccharide (PPSV23) vaccine. The PPSV23 vaccine dose should be obtained at least 8 weeks after the  dose of PCV13 vaccine. An adult aged 73 years or older who has certain medical conditions and previously received 1 or more doses of PPSV23 vaccine should receive 1 dose of PCV13. The PCV13 vaccine dose should be obtained 1 or more years after the last PPSV23 vaccine dose.    Pneumococcal polysaccharide (PPSV23) vaccine. When PCV13 is also indicated, PCV13 should be obtained first. All adults aged 47 years and older should be immunized. An adult younger than age 86 years who has certain medical conditions should be immunized. Any person who resides in a nursing home or long-term care facility should be immunized. An adult smoker should be immunized. People with an immunocompromised condition and certain other conditions should receive both PCV13 and PPSV23 vaccines. People with human immunodeficiency virus (HIV) infection should be immunized as soon as possible after diagnosis. Immunization  chemotherapy or radiation therapy should be avoided. Routine use of PPSV23 vaccine is not recommended for American Indians, [place] Natives, or people younger than 65 years unless there are medical conditions that require PPSV23 vaccine. When indicated, people who have unknown immunization and have no record of immunization should receive PPSV23 vaccine. One-time revaccination 5 years after the first dose of PPSV23 is recommended for people aged 19-64 years who have chronic kidney failure, nephrotic syndrome, asplenia, or immunocompromised conditions. People who received 1-2 doses of PPSV23 before age 65 years should receive another dose of PPSV23 vaccine at age 65 years or later if at least 5 years have passed since the previous dose. Doses of PPSV23 are not needed for people immunized with PPSV23 at or after age 65 years.   Preventive Services / Frequency  Ages 65 years and over  Blood pressure check.  Lipid and cholesterol check.  Lung cancer screening. / Every year if you are aged 55-80 years and have a  30-pack-year history of smoking and currently smoke or have quit within the past 15 years. Yearly screening is stopped once you have quit smoking for at least 15 years or develop a health problem that would prevent you from having lung cancer treatment.  Clinical breast exam.** / Every year after age 40 years.  BRCA-related cancer risk assessment.** / For women who have family members with a BRCA-related cancer (breast, ovarian, tubal, or peritoneal cancers).  Mammogram.** / Every year beginning at age 40 years and continuing for as long as you are in good health. Consult with your health care provider.  Pap test.** / Every 3 years starting at age 30 years through age 65 or 70 years with 3 consecutive normal Pap tests. Testing can be stopped between 65 and 70 years with 3 consecutive normal Pap tests and no abnormal Pap or HPV tests in the past 10 years.  Fecal occult blood test (FOBT) of stool. / Every year beginning at age 50 years and continuing until age 75 years. You may not need to do this test if you get a colonoscopy every 10 years.  Flexible sigmoidoscopy or colonoscopy.** / Every 5 years for a flexible sigmoidoscopy or every 10 years for a colonoscopy beginning at age 50 years and continuing until age 75 years.  Hepatitis C blood test.** / For all people born from 1945 through 1965 and any individual with known risks for hepatitis C.  Osteoporosis screening.** / A one-time screening for women ages 65 years and over and women at risk for fractures or osteoporosis.  Skin self-exam. / Monthly.  Influenza vaccine. / Every year.  Tetanus, diphtheria, and acellular pertussis (Tdap/Td) vaccine.** / 1 dose of Td every 10 years.  Zoster vaccine.** / 1 dose for adults aged 60 years or older.  Pneumococcal 13-valent conjugate (PCV13) vaccine.** / Consult your health care provider.  Pneumococcal polysaccharide (PPSV23) vaccine.** / 1 dose for all adults aged 65 years and older. Screening  for abdominal aortic aneurysm (AAA)  by ultrasound is recommended for people who have history of high blood pressure or who are current or former smokers.  

## 2015-09-22 LAB — VITAMIN D 25 HYDROXY (VIT D DEFICIENCY, FRACTURES): VIT D 25 HYDROXY: 61 ng/mL (ref 30–100)

## 2015-09-22 LAB — URINALYSIS, ROUTINE W REFLEX MICROSCOPIC
BILIRUBIN URINE: NEGATIVE
GLUCOSE, UA: NEGATIVE
Hgb urine dipstick: NEGATIVE
Ketones, ur: NEGATIVE
Leukocytes, UA: NEGATIVE
Nitrite: NEGATIVE
PH: 6 (ref 5.0–8.0)
PROTEIN: NEGATIVE
Specific Gravity, Urine: 1.008 (ref 1.001–1.035)

## 2015-09-22 LAB — INSULIN, RANDOM: INSULIN: 16.7 u[IU]/mL (ref 2.0–19.6)

## 2015-09-22 LAB — MICROALBUMIN / CREATININE URINE RATIO
Creatinine, Urine: 43 mg/dL (ref 20–320)
Microalb, Ur: <0.2 mg/dL

## 2015-09-22 LAB — HEMOGLOBIN A1C
HEMOGLOBIN A1C: 5.3 % (ref <5.7)
Mean Plasma Glucose: 105 mg/dL (ref <117)

## 2015-10-12 ENCOUNTER — Ambulatory Visit (HOSPITAL_COMMUNITY)
Admission: RE | Admit: 2015-10-12 | Discharge: 2015-10-12 | Disposition: A | Discharge status: Home / Self Care | Payer: Medicare Other | Source: Ambulatory Visit | Attending: Internal Medicine | Admitting: Internal Medicine

## 2015-10-12 ENCOUNTER — Other Ambulatory Visit (HOSPITAL_BASED_OUTPATIENT_CLINIC_OR_DEPARTMENT_OTHER): Payer: Medicare Other

## 2015-10-12 ENCOUNTER — Encounter (HOSPITAL_COMMUNITY): Payer: Self-pay

## 2015-10-12 ENCOUNTER — Other Ambulatory Visit (HOSPITAL_COMMUNITY): Payer: Medicare Other

## 2015-10-12 DIAGNOSIS — Z9221 Personal history of antineoplastic chemotherapy: Secondary | ICD-10-CM | POA: Diagnosis not present

## 2015-10-12 DIAGNOSIS — D1803 Hemangioma of intra-abdominal structures: Secondary | ICD-10-CM | POA: Diagnosis not present

## 2015-10-12 DIAGNOSIS — C3490 Malignant neoplasm of unspecified part of unspecified bronchus or lung: Secondary | ICD-10-CM | POA: Insufficient documentation

## 2015-10-12 DIAGNOSIS — M47816 Spondylosis without myelopathy or radiculopathy, lumbar region: Secondary | ICD-10-CM | POA: Insufficient documentation

## 2015-10-12 DIAGNOSIS — I700 Atherosclerosis of aorta: Secondary | ICD-10-CM | POA: Diagnosis not present

## 2015-10-12 DIAGNOSIS — D739 Disease of spleen, unspecified: Secondary | ICD-10-CM | POA: Insufficient documentation

## 2015-10-12 DIAGNOSIS — R918 Other nonspecific abnormal finding of lung field: Secondary | ICD-10-CM | POA: Insufficient documentation

## 2015-10-12 LAB — CBC WITH DIFFERENTIAL/PLATELET
BASO%: 0.2 % (ref 0.0–2.0)
BASOS ABS: 0 10*3/uL (ref 0.0–0.1)
EOS%: 0 % (ref 0.0–7.0)
Eosinophils Absolute: 0 10*3/uL (ref 0.0–0.5)
HEMATOCRIT: 40.5 % (ref 34.8–46.6)
HGB: 13.1 g/dL (ref 11.6–15.9)
LYMPH#: 1 10*3/uL (ref 0.9–3.3)
LYMPH%: 7.5 % — ABNORMAL LOW (ref 14.0–49.7)
MCH: 27.2 pg (ref 25.1–34.0)
MCHC: 32.3 g/dL (ref 31.5–36.0)
MCV: 84.3 fL (ref 79.5–101.0)
MONO#: 0.7 10*3/uL (ref 0.1–0.9)
MONO%: 5.4 % (ref 0.0–14.0)
NEUT#: 11.3 10*3/uL — ABNORMAL HIGH (ref 1.5–6.5)
NEUT%: 86.9 % — AB (ref 38.4–76.8)
PLATELETS: 228 10*3/uL (ref 145–400)
RBC: 4.8 10*6/uL (ref 3.70–5.45)
RDW: 13.9 % (ref 11.2–14.5)
WBC: 13 10*3/uL — ABNORMAL HIGH (ref 3.9–10.3)

## 2015-10-12 LAB — COMPREHENSIVE METABOLIC PANEL
ALT: 54 U/L (ref 0–55)
ANION GAP: 9 meq/L (ref 3–11)
AST: 30 U/L (ref 5–34)
Albumin: 3.8 g/dL (ref 3.5–5.0)
Alkaline Phosphatase: 82 U/L (ref 40–150)
BILIRUBIN TOTAL: 0.98 mg/dL (ref 0.20–1.20)
BUN: 19.5 mg/dL (ref 7.0–26.0)
CALCIUM: 9.8 mg/dL (ref 8.4–10.4)
CHLORIDE: 105 meq/L (ref 98–109)
CO2: 25 meq/L (ref 22–29)
CREATININE: 0.9 mg/dL (ref 0.6–1.1)
EGFR: 64 mL/min/{1.73_m2} — ABNORMAL LOW (ref >90)
Glucose: 104 mg/dl (ref 70–140)
Potassium: 3.9 mEq/L (ref 3.5–5.1)
Sodium: 139 mEq/L (ref 136–145)
TOTAL PROTEIN: 6.9 g/dL (ref 6.4–8.3)

## 2015-10-12 MED ORDER — IOHEXOL 300 MG/ML  SOLN
100.0000 mL | Freq: Once | INTRAMUSCULAR | Status: AC | PRN
Start: 2015-10-12 — End: 2015-10-12
  Administered 2015-10-12: 100 mL via INTRAVENOUS

## 2015-10-19 ENCOUNTER — Ambulatory Visit (HOSPITAL_BASED_OUTPATIENT_CLINIC_OR_DEPARTMENT_OTHER): Payer: Medicare Other | Admitting: Internal Medicine

## 2015-10-19 ENCOUNTER — Encounter: Payer: Self-pay | Admitting: Internal Medicine

## 2015-10-19 VITALS — BP 136/81 | HR 96 | Temp 97.9°F | Resp 18 | Ht 60.0 in | Wt 144.3 lb

## 2015-10-19 DIAGNOSIS — C3492 Malignant neoplasm of unspecified part of left bronchus or lung: Secondary | ICD-10-CM

## 2015-10-19 DIAGNOSIS — Z5111 Encounter for antineoplastic chemotherapy: Secondary | ICD-10-CM

## 2015-10-19 DIAGNOSIS — C7800 Secondary malignant neoplasm of unspecified lung: Secondary | ICD-10-CM | POA: Diagnosis not present

## 2015-10-19 DIAGNOSIS — R05 Cough: Secondary | ICD-10-CM | POA: Diagnosis not present

## 2015-10-19 DIAGNOSIS — G4700 Insomnia, unspecified: Secondary | ICD-10-CM | POA: Diagnosis not present

## 2015-10-19 DIAGNOSIS — C3432 Malignant neoplasm of lower lobe, left bronchus or lung: Secondary | ICD-10-CM | POA: Diagnosis not present

## 2015-10-19 MED ORDER — HYDROCODONE-HOMATROPINE 5-1.5 MG/5ML PO SYRP: 5.0000 mL | ORAL_SOLUTION | Freq: Four times a day (QID) | ORAL | Status: DC | PRN

## 2015-10-19 NOTE — Progress Notes (Signed)
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  PRIOR THERAPY: None  CURRENT THERAPY: Gilotrif 40 mg by mouth daily. First dose started on 05/11/2015. Status post 5 months of treatment.  INTERVAL HISTORY: Elizabeth [name] 69 y.o. female returns to the clinic today for follow-up visit accompanied by her husband and son. The patient is feeling fine today with no specific complaints except for mild cough and she is requesting refill of Hycodan. She has insomnia for many years and her primary care physician change in her treatment from Ambien to Valium. She has no significant skin rash but has occasional episodes of diarrhea. She denied having any significant chest pain, cough or hemoptysis. She has no significant weight loss or night sweats. She denied having any significant nausea or vomiting, no fever or chills. She had repeat CT scan of the chest, abdomen and pelvis performed recently and she is here for evaluation and discussion of her scan results.  MEDICAL HISTORY: Past Medical History  Diagnosis Date  . Hypertension   . Hyperlipidemia   . Thyroid disease   . Prediabetes   . Allergy   . Anxiety   . Insomnia   . Cancer (HCC)     ALLERGIES:  is allergic to penicillins.  MEDICATIONS:  Current Outpatient Prescriptions  Medication Sig Dispense Refill  . ALPRAZolam (XANAX) 1 MG tablet Take 1/2 to 1 tablet 3 x day if needed for anxiety (Patient not taking: Reported on 09/21/2015) 90 tablet 5  . aspirin 81 MG tablet Take 81 mg by mouth at bedtime.     . Cholecalciferol (VITAMIN D3)  10000 UNITS TABS Take by mouth.    . diazepam (VALIUM) 5 MG tablet Take 1 tablet (5 mg total) by mouth at bedtime. 30 tablet 0  . GILOTRIF 40 MG tablet TAKE 1 TABLET BY MOUTH ONCE DAILY. TAKE ON AN EMPTY STOMACH 1 HOUR BEFORE OR 2 HOURS AFTER MEALS 30 tablet 2  . levothyroxine (SYNTHROID, LEVOTHROID) 50 MCG tablet TAKE 1 TABLET BY MOUTH EVERY DAY 90 tablet 1  . montelukast (SINGULAIR) 10 MG tablet TAKE 1 TABLET BY MOUTH EVERY DAY 90 tablet 0   No current facility-administered medications for this visit.    SURGICAL HISTORY:  Past Surgical History  Procedure Laterality Date  . Tonsillectomy and adenoidectomy    . Abdominal hysterectomy  1995    w BSO    REVIEW OF SYSTEMS:  Constitutional: negative Eyes: negative Ears, nose, mouth, throat, and face: negative Respiratory: positive for cough Cardiovascular: negative Gastrointestinal: positive for diarrhea Genitourinary:negative Integument/breast: negative Hematologic/lymphatic: negative Musculoskeletal:negative Neurological: negative Behavioral/Psych: positive for sleep disturbance Endocrine: negative Allergic/Immunologic: negative   PHYSICAL EXAMINATION: General appearance: alert, cooperative and no distress Head: Normocephalic, without obvious abnormality, atraumatic Neck: no adenopathy, no JVD, supple, symmetrical, trachea midline and thyroid not enlarged, symmetric, no tenderness/mass/nodules Lymph nodes: Cervical, supraclavicular, and axillary nodes normal. Resp: clear to auscultation bilaterally Back: symmetric, no curvature. ROM normal. No CVA tenderness. Cardio: regular rate and rhythm, S1, S2 normal, no murmur, click, rub or gallop GI: soft, non-tender; bowel sounds normal; no masses,  no organomegaly Extremities:  extremities normal, atraumatic, no cyanosis or edema Neurologic: Alert and oriented X 3, normal strength and tone. Normal symmetric reflexes. Normal coordination and gait  ECOG PERFORMANCE STATUS: 0 -  Asymptomatic  Blood pressure 136/81, pulse 96, temperature 97.9 F (36.6 C), temperature source Oral, resp. rate 18, height 5' (1.524 m), weight 144 lb 4.8 oz (65.454 kg), SpO2 99 %.  LABORATORY DATA: Lab Results  Component Value Date   WBC 13.0* 10/12/2015   HGB 13.1 10/12/2015   HCT 40.5 10/12/2015   MCV 84.3 10/12/2015   PLT 228 10/12/2015      Chemistry      Component Value Date/Time   NA 139 10/12/2015 1115   NA 138 09/21/2015 1504   K 3.9 10/12/2015 1115   K 4.2 09/21/2015 1504   CL 103 09/21/2015 1504   CO2 25 10/12/2015 1115   CO2 28 09/21/2015 1504   BUN 19.5 10/12/2015 1115   BUN 18 09/21/2015 1504   CREATININE 0.9 10/12/2015 1115   CREATININE 0.94 09/21/2015 1504   CREATININE 1.17* 05/13/2015 1855      Component Value Date/Time   CALCIUM 9.8 10/12/2015 1115   CALCIUM 8.9 09/21/2015 1504   ALKPHOS 82 10/12/2015 1115   ALKPHOS 71 09/21/2015 1504   AST 30 10/12/2015 1115   AST 33 09/21/2015 1504   ALT 54 10/12/2015 1115   ALT 54* 09/21/2015 1504   BILITOT 0.98 10/12/2015 1115   BILITOT 1.1 09/21/2015 1504       RADIOGRAPHIC STUDIES: Ct Chest W Contrast  10/12/2015  CLINICAL DATA:  Restaging lung cancer diagnosed in June 2016 post chemotherapy. Adenocarcinoma. EXAM: CT CHEST, ABDOMEN, AND PELVIS WITH CONTRAST TECHNIQUE: Multidetector CT imaging of the chest, abdomen and pelvis was performed following the standard protocol during bolus administration of intravenous contrast. CONTRAST:  112m OMNIPAQUE IOHEXOL 300 MG/ML  SOLN COMPARISON:  CT 07/01/2015 and 05/13/2015. FINDINGS: CT CHEST Mediastinum/Nodes: There are no enlarged mediastinal, hilar or axillary lymph nodes. The thyroid gland, trachea and esophagus demonstrate no significant findings. The heart size is normal. There is no pericardial effusion. There are no significant vascular findings. Lungs/Pleura: There is no pleural effusion.There has been further improvement in the previously demonstrated  innumerable pulmonary nodules bilaterally. The remaining nodules are smaller, less well-defined and less dense. The left infrahilar index lesion remains difficult to measure given the associated volume loss and adjacent opacity, although measures 2.8 x 2.2 cm on image 33 (previously 2.7 x 2.5 cm). No new or enlarging nodules identified. Musculoskeletal/Chest wall: No chest wall mass or suspicious osseous findings. CT ABDOMEN AND PELVIS FINDINGS Hepatobiliary: At approximately 3.4 cm hemangioma posteriorly in the right hepatic lobe on image 46 is unchanged. There are no worrisome hepatic findings. No evidence of gallstones, gallbladder wall thickening or biliary dilatation. Pancreas: Unremarkable. No pancreatic ductal dilatation or surrounding inflammatory changes. Spleen: Stable calcified splenic granuloma. The spleen is normal in size without other focal abnormality. Adrenals/Urinary Tract: Both adrenal glands appear normal. The kidneys appear normal without evidence of urinary tract calculus, suspicious lesion or hydronephrosis. No bladder abnormalities are seen. Stomach/Bowel: No evidence of bowel wall thickening, distention or surrounding inflammatory change. The appendix appears normal. Vascular/Lymphatic: There are no enlarged abdominal or pelvic lymph nodes. Mild aortoiliac atherosclerosis. Reproductive: Hysterectomy.  No evidence of adnexal mass. Other: No ascites or peritoneal nodularity. Musculoskeletal: No acute or significant osseous findings. Stable lower lumbar spondylosis with degenerative grade 1 anterolisthesis at L4-5 and endplate sclerosis at LZ6-X0 IMPRESSION: 1. Continued improvement in  the multiple bilateral pulmonary nodules. The dominant left lower lobe mass appears slightly smaller, difficult to measure given adjacent atelectasis. 2. No evidence of disease progression. No adenopathy or subdiaphragmatic involvement. 3. Hepatic hemangioma and lower lumbar spondylosis again noted.  Electronically Signed   By: Richardean Sale M.D.   On: 10/12/2015 13:33   Ct Abdomen Pelvis W Contrast  10/12/2015  CLINICAL DATA:  Restaging lung cancer diagnosed in June 2016 post chemotherapy. Adenocarcinoma. EXAM: CT CHEST, ABDOMEN, AND PELVIS WITH CONTRAST TECHNIQUE: Multidetector CT imaging of the chest, abdomen and pelvis was performed following the standard protocol during bolus administration of intravenous contrast. CONTRAST:  139m OMNIPAQUE IOHEXOL 300 MG/ML  SOLN COMPARISON:  CT 07/01/2015 and 05/13/2015. FINDINGS: CT CHEST Mediastinum/Nodes: There are no enlarged mediastinal, hilar or axillary lymph nodes. The thyroid gland, trachea and esophagus demonstrate no significant findings. The heart size is normal. There is no pericardial effusion. There are no significant vascular findings. Lungs/Pleura: There is no pleural effusion.There has been further improvement in the previously demonstrated innumerable pulmonary nodules bilaterally. The remaining nodules are smaller, less well-defined and less dense. The left infrahilar index lesion remains difficult to measure given the associated volume loss and adjacent opacity, although measures 2.8 x 2.2 cm on image 33 (previously 2.7 x 2.5 cm). No new or enlarging nodules identified. Musculoskeletal/Chest wall: No chest wall mass or suspicious osseous findings. CT ABDOMEN AND PELVIS FINDINGS Hepatobiliary: At approximately 3.4 cm hemangioma posteriorly in the right hepatic lobe on image 46 is unchanged. There are no worrisome hepatic findings. No evidence of gallstones, gallbladder wall thickening or biliary dilatation. Pancreas: Unremarkable. No pancreatic ductal dilatation or surrounding inflammatory changes. Spleen: Stable calcified splenic granuloma. The spleen is normal in size without other focal abnormality. Adrenals/Urinary Tract: Both adrenal glands appear normal. The kidneys appear normal without evidence of urinary tract calculus, suspicious  lesion or hydronephrosis. No bladder abnormalities are seen. Stomach/Bowel: No evidence of bowel wall thickening, distention or surrounding inflammatory change. The appendix appears normal. Vascular/Lymphatic: There are no enlarged abdominal or pelvic lymph nodes. Mild aortoiliac atherosclerosis. Reproductive: Hysterectomy.  No evidence of adnexal mass. Other: No ascites or peritoneal nodularity. Musculoskeletal: No acute or significant osseous findings. Stable lower lumbar spondylosis with degenerative grade 1 anterolisthesis at L4-5 and endplate sclerosis at LH6-F7 IMPRESSION: 1. Continued improvement in the multiple bilateral pulmonary nodules. The dominant left lower lobe mass appears slightly smaller, difficult to measure given adjacent atelectasis. 2. No evidence of disease progression. No adenopathy or subdiaphragmatic involvement. 3. Hepatic hemangioma and lower lumbar spondylosis again noted. Electronically Signed   By: WRichardean SaleM.D.   On: 10/12/2015 13:33    ASSESSMENT AND PLAN: This is a very pleasant 69years old white female recently diagnosed with a stage IV non-small cell lung cancer, adenocarcinoma with positive EGFR mutation with deletion in exon 19. The patient was started on treatment with Gilotrif 40 mg by mouth daily and tolerating it much better after start taking it at nighttime. She status post 5 months of treatment.  The patient has no significant complaints today except for mild cough. The recent CT scan of the chest, abdomen and pelvis showed further improvement of her disease. I discussed the scan results with the patient and her family.  I recommended for her to continue her current treatment with Gilotrif as a scheduled. The patient would come back for follow-up visit in one month for reevaluation and repeat blood work. For the dry cough, I gave the patient a  refill of Vicodin today. For insomnia, she will continue on treatment with Valium as prescribed by her primary  care physician. She was advised to call immediately if she has any concerning symptoms in the interval. The patient voices understanding of current disease status and treatment options and is in agreement with the current care plan.  All questions were answered. The patient knows to call the clinic with any problems, questions or concerns. We can certainly see the patient much sooner if necessary.  Disclaimer: This note was dictated with voice recognition software. Similar sounding words can inadvertently be transcribed and may not be corrected upon review.

## 2015-10-28 ENCOUNTER — Telehealth: Payer: Self-pay | Admitting: Internal Medicine

## 2015-10-28 NOTE — Telephone Encounter (Signed)
Left message for patient re 1/27 lab/fu. Appointments completed by AR.

## 2015-11-12 ENCOUNTER — Other Ambulatory Visit: Payer: Self-pay | Admitting: Medical Oncology

## 2015-11-12 ENCOUNTER — Telehealth: Payer: Self-pay | Admitting: Internal Medicine

## 2015-11-12 NOTE — Telephone Encounter (Signed)
Spoke with patient and she is aware of her new time,i spoke with diane b to make sure that a Friday was ok and she stated yes

## 2015-11-16 ENCOUNTER — Other Ambulatory Visit: Payer: Self-pay | Admitting: Internal Medicine

## 2015-11-16 MED FILL — *GILOTRIF 40MG TABLET: 40 | 30 days supply | Qty: 30 | Fill #0

## 2015-11-19 ENCOUNTER — Encounter: Payer: Self-pay | Admitting: Internal Medicine

## 2015-11-19 ENCOUNTER — Other Ambulatory Visit (HOSPITAL_BASED_OUTPATIENT_CLINIC_OR_DEPARTMENT_OTHER): Payer: Medicare Other

## 2015-11-19 ENCOUNTER — Telehealth: Payer: Self-pay | Admitting: Internal Medicine

## 2015-11-19 ENCOUNTER — Other Ambulatory Visit: Payer: Medicare Other

## 2015-11-19 ENCOUNTER — Ambulatory Visit: Payer: Medicare Other | Admitting: Internal Medicine

## 2015-11-19 ENCOUNTER — Ambulatory Visit (HOSPITAL_BASED_OUTPATIENT_CLINIC_OR_DEPARTMENT_OTHER): Payer: Medicare Other | Admitting: Internal Medicine

## 2015-11-19 VITALS — BP 127/68 | HR 88 | Temp 97.7°F | Resp 18 | Wt 143.6 lb

## 2015-11-19 DIAGNOSIS — C7800 Secondary malignant neoplasm of unspecified lung: Secondary | ICD-10-CM

## 2015-11-19 DIAGNOSIS — Z5111 Encounter for antineoplastic chemotherapy: Secondary | ICD-10-CM

## 2015-11-19 DIAGNOSIS — C3492 Malignant neoplasm of unspecified part of left bronchus or lung: Secondary | ICD-10-CM

## 2015-11-19 DIAGNOSIS — C3432 Malignant neoplasm of lower lobe, left bronchus or lung: Secondary | ICD-10-CM

## 2015-11-19 LAB — CBC WITH DIFFERENTIAL/PLATELET
BASO%: 0.5 % (ref 0.0–2.0)
Basophils Absolute: 0 10*3/uL (ref 0.0–0.1)
EOS%: 5.7 % (ref 0.0–7.0)
Eosinophils Absolute: 0.3 10*3/uL (ref 0.0–0.5)
HCT: 37.2 % (ref 34.8–46.6)
HEMOGLOBIN: 12.3 g/dL (ref 11.6–15.9)
LYMPH%: 16.4 % (ref 14.0–49.7)
MCH: 27.6 pg (ref 25.1–34.0)
MCHC: 33.1 g/dL (ref 31.5–36.0)
MCV: 83.6 fL (ref 79.5–101.0)
MONO#: 0.6 10*3/uL (ref 0.1–0.9)
MONO%: 9.6 % (ref 0.0–14.0)
NEUT%: 67.8 % (ref 38.4–76.8)
NEUTROS ABS: 4 10*3/uL (ref 1.5–6.5)
Platelets: 211 10*3/uL (ref 145–400)
RBC: 4.45 10*6/uL (ref 3.70–5.45)
RDW: 13.3 % (ref 11.2–14.5)
WBC: 5.9 10*3/uL (ref 3.9–10.3)
lymph#: 1 10*3/uL (ref 0.9–3.3)

## 2015-11-19 LAB — COMPREHENSIVE METABOLIC PANEL
ALBUMIN: 3.2 g/dL — AB (ref 3.5–5.0)
ALK PHOS: 83 U/L (ref 40–150)
ALT: 41 U/L (ref 0–55)
ANION GAP: 8 meq/L (ref 3–11)
AST: 29 U/L (ref 5–34)
BUN: 18.2 mg/dL (ref 7.0–26.0)
CALCIUM: 9.1 mg/dL (ref 8.4–10.4)
CHLORIDE: 108 meq/L (ref 98–109)
CO2: 25 mEq/L (ref 22–29)
Creatinine: 0.9 mg/dL (ref 0.6–1.1)
EGFR: 68 mL/min/{1.73_m2} — AB (ref >90)
Glucose: 106 mg/dl (ref 70–140)
POTASSIUM: 3.8 meq/L (ref 3.5–5.1)
Sodium: 141 mEq/L (ref 136–145)
Total Bilirubin: 0.77 mg/dL (ref 0.20–1.20)
Total Protein: 6.3 g/dL — ABNORMAL LOW (ref 6.4–8.3)

## 2015-11-19 NOTE — Progress Notes (Signed)
[facility] Telephone:([phone]   Fax:(336) [place], [place] [phone]  DIAGNOSIS: Stage IV (T2a, N0, M1 a) non-small cell lung cancer, adenocarcinoma with positive EGFR mutation with deletion in exon 19, presenting with a large dominant mass and the left lower lobe in addition to multiple pulmonary nodules bilaterally diagnosed in June 2016.  PRIOR THERAPY: None  CURRENT THERAPY: Gilotrif 40 mg by mouth daily. First dose started on 05/11/2015. Status post 6 months of treatment.  INTERVAL HISTORY: Elizabeth [name] 70 y.o. female returns to the clinic today for follow-up visit accompanied by her husband. The patient is feeling fine today with no specific complaints except for occasional diarrhea with certain foods.  She has no significant skin rash but a few surgeries in her scalp. She denied having any significant chest pain, cough or hemoptysis. She has no significant weight loss or night sweats. She denied having any significant nausea or vomiting, no fever or chills. She had repeat CBC performed earlier today and she is here for evaluation and discussion of her lab results.  MEDICAL HISTORY: Past Medical History  Diagnosis Date  . Hypertension   . Hyperlipidemia   . Thyroid disease   . Prediabetes   . Allergy   . Anxiety   . Insomnia   . Cancer ([facility])   . Adenocarcinoma of left lung, stage 4 ([facility]) 04/05/2015    Biopsy confirmed CT SCAN: There are innumerable bilateral pulmonary nodules. These range in size from about 5 mm to 2 cm. They demonstrate irregular indistinct borders, the larger ones demonstrating spiculation. PET SCAN: Diffuse pulmonary metastatic disease with a 4 cm dominant left lower low lung lesion. No enlarged or hypermetabolic mediastinal or hilar adenopathy.    ALLERGIES:  is allergic to penicillins.  MEDICATIONS:  Current Outpatient Prescriptions    Medication Sig Dispense Refill  . ALPRAZolam (XANAX) 1 MG tablet Take 1/2 to 1 tablet 3 x day if needed for anxiety 90 tablet 5  . aspirin 81 MG tablet Take 81 mg by mouth at bedtime.     . Cholecalciferol (VITAMIN D3) 10000 UNITS TABS Take by mouth.    Marland Kitchen GILOTRIF 40 MG tablet TAKE 1 TABLET BY MOUTH ONCE DAILY. TAKE ON AN EMPTY STOMACH 1 HOUR BEFORE OR 2 HOURS AFTER MEALS 30 tablet 2  . HYDROcodone-homatropine (HYCODAN) 5-1.5 MG/5ML syrup Take 5 mLs by mouth every 6 (six) hours as needed for cough. 120 mL 0  . levothyroxine (SYNTHROID, LEVOTHROID) 50 MCG tablet TAKE 1 TABLET BY MOUTH EVERY DAY 90 tablet 1  . montelukast (SINGULAIR) 10 MG tablet TAKE 1 TABLET BY MOUTH EVERY DAY 90 tablet 0   No current facility-administered medications for this visit.    SURGICAL HISTORY:  Past Surgical History  Procedure Laterality Date  . Tonsillectomy and adenoidectomy    . Abdominal hysterectomy  1995    w BSO    REVIEW OF SYSTEMS:  A comprehensive review of systems was negative except for: Gastrointestinal: positive for diarrhea Integument/breast: positive for rash   PHYSICAL EXAMINATION: General appearance: alert, cooperative and no distress Head: Normocephalic, without obvious abnormality, atraumatic Neck: no adenopathy, no JVD, supple, symmetrical, trachea midline and thyroid not enlarged, symmetric, no tenderness/mass/nodules Lymph nodes: Cervical, supraclavicular, and axillary nodes normal. Resp: clear to auscultation bilaterally Back: symmetric, no curvature. ROM normal. No CVA tenderness. Cardio: regular rate and rhythm, S1, S2 normal, no murmur,  click, rub or gallop GI: soft, non-tender; bowel sounds normal; no masses,  no organomegaly Extremities: extremities normal, atraumatic, no cyanosis or edema Neurologic: Alert and oriented X 3, normal strength and tone. Normal symmetric reflexes. Normal coordination and gait  ECOG PERFORMANCE STATUS: 0 - Asymptomatic  Blood pressure 127/68,  pulse 88, temperature 97.7 F (36.5 C), temperature source Oral, resp. rate 18, weight 143 lb 9.6 oz (65.137 kg), SpO2 99 %.  LABORATORY DATA: Lab Results  Component Value Date   WBC 5.9 11/19/2015   HGB 12.3 11/19/2015   HCT 37.2 11/19/2015   MCV 83.6 11/19/2015   PLT 211 11/19/2015      Chemistry      Component Value Date/Time   NA 139 10/12/2015 1115   NA 138 09/21/2015 1504   K 3.9 10/12/2015 1115   K 4.2 09/21/2015 1504   CL 103 09/21/2015 1504   CO2 25 10/12/2015 1115   CO2 28 09/21/2015 1504   BUN 19.5 10/12/2015 1115   BUN 18 09/21/2015 1504   CREATININE 0.9 10/12/2015 1115   CREATININE 0.94 09/21/2015 1504   CREATININE 1.17* 05/13/2015 1855      Component Value Date/Time   CALCIUM 9.8 10/12/2015 1115   CALCIUM 8.9 09/21/2015 1504   ALKPHOS 82 10/12/2015 1115   ALKPHOS 71 09/21/2015 1504   AST 30 10/12/2015 1115   AST 33 09/21/2015 1504   ALT 54 10/12/2015 1115   ALT 54* 09/21/2015 1504   BILITOT 0.98 10/12/2015 1115   BILITOT 1.1 09/21/2015 1504       RADIOGRAPHIC STUDIES: No results found.  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 The patient was started on treatment with Gilotrif 40 mg by mouth daily and tolerating it much better after start taking it at nighttime. She status post 6 months of treatment.  The patient has no significant complaints today except for mild sores in the scalp and few episodes of diarrhea.  I recommended for her to continue her current treatment with Gilotrif as a scheduled. The patient would come back for follow-up visit in one month for reevaluation and repeat blood work. She was advised to call immediately if she has any concerning symptoms in the interval. The patient voices understanding of current disease status and treatment options and is in agreement with the  current care plan.  All questions were answered. The patient knows to call the clinic with any problems, questions or concerns. We can certainly see the patient much sooner if necessary.  Disclaimer: This note was dictated with voice recognition software. Similar sounding words can inadvertently be transcribed and may not be corrected upon review.

## 2015-11-19 NOTE — Telephone Encounter (Signed)
per pof to sch appt-gave pt copy of avs

## 2015-12-05 ENCOUNTER — Other Ambulatory Visit: Payer: Self-pay | Admitting: Internal Medicine

## 2015-12-11 ENCOUNTER — Encounter: Payer: Self-pay | Admitting: *Deleted

## 2015-12-16 MED FILL — *GILOTRIF 40MG TABLET: 40 | 30 days supply | Qty: 30 | Fill #1

## 2015-12-20 ENCOUNTER — Encounter: Payer: Self-pay | Admitting: Internal Medicine

## 2015-12-20 ENCOUNTER — Telehealth: Payer: Self-pay | Admitting: Internal Medicine

## 2015-12-20 ENCOUNTER — Other Ambulatory Visit (HOSPITAL_BASED_OUTPATIENT_CLINIC_OR_DEPARTMENT_OTHER): Payer: Medicare Other

## 2015-12-20 ENCOUNTER — Ambulatory Visit (HOSPITAL_BASED_OUTPATIENT_CLINIC_OR_DEPARTMENT_OTHER): Payer: Medicare Other | Admitting: Internal Medicine

## 2015-12-20 VITALS — BP 140/70 | HR 68 | Temp 98.0°F | Resp 18 | Ht 60.0 in | Wt 143.6 lb

## 2015-12-20 DIAGNOSIS — Z5111 Encounter for antineoplastic chemotherapy: Secondary | ICD-10-CM

## 2015-12-20 DIAGNOSIS — C3432 Malignant neoplasm of lower lobe, left bronchus or lung: Secondary | ICD-10-CM | POA: Diagnosis not present

## 2015-12-20 DIAGNOSIS — C7800 Secondary malignant neoplasm of unspecified lung: Secondary | ICD-10-CM | POA: Diagnosis not present

## 2015-12-20 DIAGNOSIS — C3492 Malignant neoplasm of unspecified part of left bronchus or lung: Secondary | ICD-10-CM

## 2015-12-20 LAB — CBC WITH DIFFERENTIAL/PLATELET
BASO%: 1 % (ref 0.0–2.0)
BASOS ABS: 0.1 10*3/uL (ref 0.0–0.1)
EOS%: 4.9 % (ref 0.0–7.0)
Eosinophils Absolute: 0.3 10*3/uL (ref 0.0–0.5)
HCT: 38.3 % (ref 34.8–46.6)
HGB: 12.8 g/dL (ref 11.6–15.9)
LYMPH%: 15.8 % (ref 14.0–49.7)
MCH: 27.8 pg (ref 25.1–34.0)
MCHC: 33.3 g/dL (ref 31.5–36.0)
MCV: 83.5 fL (ref 79.5–101.0)
MONO#: 0.7 10*3/uL (ref 0.1–0.9)
MONO%: 11.5 % (ref 0.0–14.0)
NEUT#: 3.8 10*3/uL (ref 1.5–6.5)
NEUT%: 66.8 % (ref 38.4–76.8)
PLATELETS: 197 10*3/uL (ref 145–400)
RBC: 4.58 10*6/uL (ref 3.70–5.45)
RDW: 13.8 % (ref 11.2–14.5)
WBC: 5.7 10*3/uL (ref 3.9–10.3)
lymph#: 0.9 10*3/uL (ref 0.9–3.3)

## 2015-12-20 LAB — COMPREHENSIVE METABOLIC PANEL
ALT: 41 U/L (ref 0–55)
ANION GAP: 9 meq/L (ref 3–11)
AST: 26 U/L (ref 5–34)
Albumin: 3.3 g/dL — ABNORMAL LOW (ref 3.5–5.0)
Alkaline Phosphatase: 65 U/L (ref 40–150)
BUN: 15 mg/dL (ref 7.0–26.0)
CALCIUM: 9 mg/dL (ref 8.4–10.4)
CHLORIDE: 109 meq/L (ref 98–109)
CO2: 24 meq/L (ref 22–29)
Creatinine: 1 mg/dL (ref 0.6–1.1)
EGFR: 59 mL/min/{1.73_m2} — AB (ref >90)
Glucose: 86 mg/dl (ref 70–140)
POTASSIUM: 4 meq/L (ref 3.5–5.1)
Sodium: 141 mEq/L (ref 136–145)
Total Bilirubin: 1.06 mg/dL (ref 0.20–1.20)
Total Protein: 6.3 g/dL — ABNORMAL LOW (ref 6.4–8.3)

## 2015-12-20 NOTE — Telephone Encounter (Signed)
Scheduled patient appt per pof, avs report printed.   Patient is aware [facility] will contact to schedule CT scan

## 2015-12-20 NOTE — Progress Notes (Signed)
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  PRIOR THERAPY: None  CURRENT THERAPY: Gilotrif 40 mg by mouth daily. First dose started on 05/11/2015. Status post 7 months of treatment.  INTERVAL HISTORY: Elizabeth [name] 70 y.o. female returns to the clinic today for follow-up visit accompanied by her husband. She has no significant change since her last visit. The patient is feeling fine today with no specific complaints except for occasional diarrhea with certain foods. She has no significant skin rash. She denied having any significant chest pain, cough or hemoptysis. She has no significant weight loss or night sweats. She denied having any significant nausea or vomiting, no fever or chills. She had repeat CBC performed earlier today and she is here for evaluation and discussion of her lab results.  MEDICAL HISTORY: Past Medical History  Diagnosis Date  . Hypertension   . Hyperlipidemia   . Thyroid disease   . Prediabetes   . Allergy   . Anxiety   . Insomnia   . Cancer ([place])   . Adenocarcinoma of left lung, stage 4 ([place]) 04/05/2015    Biopsy confirmed CT SCAN: There are innumerable bilateral pulmonary nodules. These range in size from about 5 mm to 2 cm. They demonstrate irregular indistinct borders, the larger ones demonstrating spiculation. PET SCAN: Diffuse pulmonary metastatic disease with a 4 cm dominant left lower low lung lesion. No enlarged or hypermetabolic mediastinal or hilar adenopathy.    ALLERGIES:  is allergic to penicillins.  MEDICATIONS:  Current Outpatient  Prescriptions  Medication Sig Dispense Refill  . aspirin 81 MG tablet Take 81 mg by mouth at bedtime.     . Cholecalciferol (VITAMIN D3) 10000 UNITS TABS Take by mouth.    . clindamycin (CLEOCIN T) 1 % external solution Apply 1 application topically daily as needed.  0  . GILOTRIF 40 MG tablet TAKE 1 TABLET BY MOUTH ONCE DAILY. TAKE ON AN EMPTY STOMACH 1 HOUR BEFORE OR 2 HOURS AFTER MEALS 30 tablet 2  . levothyroxine (SYNTHROID, LEVOTHROID) 50 MCG tablet TAKE 1 TABLET BY MOUTH EVERY DAY 90 tablet 0  . montelukast (SINGULAIR) 10 MG tablet TAKE 1 TABLET BY MOUTH EVERY DAY 90 tablet 0  . ALPRAZolam (XANAX) 1 MG tablet Take 1/2 to 1 tablet 3 x day if needed for anxiety 90 tablet 5  . HYDROcodone-homatropine (HYCODAN) 5-1.5 MG/5ML syrup Take 5 mLs by mouth every 6 (six) hours as needed for cough. (Patient not taking: Reported on 12/20/2015) 120 mL 0   No current facility-administered medications for this visit.    SURGICAL HISTORY:  Past Surgical History  Procedure Laterality Date  . Tonsillectomy and adenoidectomy    . Abdominal hysterectomy  1995    w BSO    REVIEW OF SYSTEMS:  A comprehensive review of systems was negative except for: Gastrointestinal: positive for diarrhea   PHYSICAL EXAMINATION: General appearance: alert, cooperative and no distress Head: Normocephalic, without obvious abnormality, atraumatic Neck: no adenopathy, no JVD, supple, symmetrical, trachea midline and thyroid not enlarged, symmetric, no tenderness/mass/nodules Lymph nodes: Cervical, supraclavicular, and axillary nodes normal. Resp: clear to auscultation  bilaterally Back: symmetric, no curvature. ROM normal. No CVA tenderness. Cardio: regular rate and rhythm, S1, S2 normal, no murmur, click, rub or gallop GI: soft, non-tender; bowel sounds normal; no masses,  no organomegaly Extremities: extremities normal, atraumatic, no cyanosis or edema Neurologic: Alert and oriented X 3, normal strength and tone. Normal  symmetric reflexes. Normal coordination and gait  ECOG PERFORMANCE STATUS: 0 - Asymptomatic  Blood pressure 140/70, pulse 68, temperature 98 F (36.7 C), temperature source Oral, resp. rate 18, height 5' (1.524 m), weight 143 lb 9.6 oz (65.137 kg), SpO2 100 %.  LABORATORY DATA: Lab Results  Component Value Date   WBC 5.7 12/20/2015   HGB 12.8 12/20/2015   HCT 38.3 12/20/2015   MCV 83.5 12/20/2015   PLT 197 12/20/2015      Chemistry      Component Value Date/Time   NA 141 11/19/2015 0828   NA 138 09/21/2015 1504   K 3.8 11/19/2015 0828   K 4.2 09/21/2015 1504   CL 103 09/21/2015 1504   CO2 25 11/19/2015 0828   CO2 28 09/21/2015 1504   BUN 18.2 11/19/2015 0828   BUN 18 09/21/2015 1504   CREATININE 0.9 11/19/2015 0828   CREATININE 0.94 09/21/2015 1504   CREATININE 1.17* 05/13/2015 1855      Component Value Date/Time   CALCIUM 9.1 11/19/2015 0828   CALCIUM 8.9 09/21/2015 1504   ALKPHOS 83 11/19/2015 0828   ALKPHOS 71 09/21/2015 1504   AST 29 11/19/2015 0828   AST 33 09/21/2015 1504   ALT 41 11/19/2015 0828   ALT 54* 09/21/2015 1504   BILITOT 0.77 11/19/2015 0828   BILITOT 1.1 09/21/2015 1504       RADIOGRAPHIC STUDIES: No results found.  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 The patient was started on treatment with Gilotrif 40 mg by mouth daily and tolerating it much better after start taking it at nighttime. She status post 7 months of treatment.  The patient has no significant complaints today except for few episodes of diarrhea with certain foods.  I recommended for her to continue her current treatment with Gilotrif as a scheduled. The patient would come back for follow-up visit in one month for reevaluation and repeat blood work as well as CT scan of the chest, abdomen and pelvis for restaging of  her disease. She was advised to call immediately if she has any concerning symptoms in the interval. The patient voices understanding of current disease status and treatment options and is in agreement with the current care plan.  All questions were answered. The patient knows to call the clinic with any problems, questions or concerns. We can certainly see the patient much sooner if necessary.  Disclaimer: This note was dictated with voice recognition software. Similar sounding words can inadvertently be transcribed and may not be corrected upon review.

## 2015-12-24 ENCOUNTER — Encounter: Payer: Self-pay | Admitting: Physician Assistant

## 2015-12-24 ENCOUNTER — Ambulatory Visit (INDEPENDENT_AMBULATORY_CARE_PROVIDER_SITE_OTHER): Payer: Medicare Other | Admitting: Physician Assistant

## 2015-12-24 VITALS — BP 108/60 | HR 80 | Temp 97.3°F | Resp 16 | Ht 60.0 in | Wt 144.8 lb

## 2015-12-24 DIAGNOSIS — I10 Essential (primary) hypertension: Secondary | ICD-10-CM | POA: Diagnosis not present

## 2015-12-24 DIAGNOSIS — D649 Anemia, unspecified: Secondary | ICD-10-CM

## 2015-12-24 DIAGNOSIS — G4700 Insomnia, unspecified: Secondary | ICD-10-CM

## 2015-12-24 DIAGNOSIS — Z79899 Other long term (current) drug therapy: Secondary | ICD-10-CM

## 2015-12-24 DIAGNOSIS — E559 Vitamin D deficiency, unspecified: Secondary | ICD-10-CM

## 2015-12-24 DIAGNOSIS — Z23 Encounter for immunization: Secondary | ICD-10-CM | POA: Diagnosis not present

## 2015-12-24 DIAGNOSIS — C3492 Malignant neoplasm of unspecified part of left bronchus or lung: Secondary | ICD-10-CM | POA: Diagnosis not present

## 2015-12-24 DIAGNOSIS — R7303 Prediabetes: Secondary | ICD-10-CM | POA: Diagnosis not present

## 2015-12-24 DIAGNOSIS — R6889 Other general symptoms and signs: Secondary | ICD-10-CM | POA: Diagnosis not present

## 2015-12-24 DIAGNOSIS — E782 Mixed hyperlipidemia: Secondary | ICD-10-CM | POA: Diagnosis not present

## 2015-12-24 DIAGNOSIS — Z5111 Encounter for antineoplastic chemotherapy: Secondary | ICD-10-CM | POA: Diagnosis not present

## 2015-12-24 DIAGNOSIS — Z0001 Encounter for general adult medical examination with abnormal findings: Secondary | ICD-10-CM

## 2015-12-24 DIAGNOSIS — E669 Obesity, unspecified: Secondary | ICD-10-CM | POA: Diagnosis not present

## 2015-12-24 DIAGNOSIS — F419 Anxiety disorder, unspecified: Secondary | ICD-10-CM | POA: Diagnosis not present

## 2015-12-24 DIAGNOSIS — Z0000 Encounter for general adult medical examination without abnormal findings: Secondary | ICD-10-CM

## 2015-12-24 DIAGNOSIS — E039 Hypothyroidism, unspecified: Secondary | ICD-10-CM

## 2015-12-24 LAB — TSH: TSH: 0.52 m[IU]/L

## 2015-12-24 LAB — VITAMIN B12: VITAMIN B 12: 312 pg/mL (ref 200–1100)

## 2015-12-24 LAB — CBC WITH DIFFERENTIAL/PLATELET
Basophils Absolute: 0.1 10*3/uL (ref 0.0–0.1)
Basophils Relative: 1 % (ref 0–1)
EOS PCT: 4 % (ref 0–5)
Eosinophils Absolute: 0.2 10*3/uL (ref 0.0–0.7)
HCT: 40.3 % (ref 36.0–46.0)
Hemoglobin: 13.1 g/dL (ref 12.0–15.0)
LYMPHS ABS: 0.9 10*3/uL (ref 0.7–4.0)
LYMPHS PCT: 16 % (ref 12–46)
MCH: 27.4 pg (ref 26.0–34.0)
MCHC: 32.5 g/dL (ref 30.0–36.0)
MCV: 84.3 fL (ref 78.0–100.0)
MONO ABS: 0.5 10*3/uL (ref 0.1–1.0)
MONOS PCT: 9 % (ref 3–12)
MPV: 11.5 fL (ref 8.6–12.4)
NEUTROS ABS: 4.1 10*3/uL (ref 1.7–7.7)
Neutrophils Relative %: 70 % (ref 43–77)
PLATELETS: 222 10*3/uL (ref 150–400)
RBC: 4.78 MIL/uL (ref 3.87–5.11)
RDW: 13.9 % (ref 11.5–15.5)
WBC: 5.8 10*3/uL (ref 4.0–10.5)

## 2015-12-24 LAB — LIPID PANEL
CHOL/HDL RATIO: 3.7 ratio (ref <=5.0)
CHOLESTEROL: 157 mg/dL (ref 125–200)
HDL: 43 mg/dL — AB (ref >=46)
LDL Cholesterol: 91 mg/dL (ref <130)
Triglycerides: 114 mg/dL (ref <150)
VLDL: 23 mg/dL (ref <30)

## 2015-12-24 LAB — BASIC METABOLIC PANEL WITH GFR
BUN: 15 mg/dL (ref 7–25)
CO2: 29 mmol/L (ref 20–31)
CREATININE: 0.9 mg/dL (ref 0.50–0.99)
Calcium: 8.7 mg/dL (ref 8.6–10.4)
Chloride: 106 mmol/L (ref 98–110)
GFR, EST AFRICAN AMERICAN: 76 mL/min (ref >=60)
GFR, Est Non African American: 66 mL/min (ref >=60)
Glucose, Bld: 45 mg/dL — ABNORMAL LOW (ref 65–99)
Potassium: 4 mmol/L (ref 3.5–5.3)
SODIUM: 141 mmol/L (ref 135–146)

## 2015-12-24 LAB — HEPATIC FUNCTION PANEL
ALT: 40 U/L — ABNORMAL HIGH (ref 6–29)
AST: 21 U/L (ref 10–35)
Albumin: 3.7 g/dL (ref 3.6–5.1)
Alkaline Phosphatase: 66 U/L (ref 33–130)
BILIRUBIN DIRECT: 0.3 mg/dL — AB (ref <=0.2)
BILIRUBIN TOTAL: 1.3 mg/dL — AB (ref 0.2–1.2)
Indirect Bilirubin: 1 mg/dL (ref 0.2–1.2)
Total Protein: 6.2 g/dL (ref 6.1–8.1)

## 2015-12-24 LAB — MAGNESIUM: Magnesium: 1.6 mg/dL (ref 1.5–2.5)

## 2015-12-24 LAB — IRON AND TIBC
%SAT: 27 % (ref 11–50)
Iron: 80 ug/dL (ref 45–160)
TIBC: 297 ug/dL (ref 250–450)
UIBC: 217 ug/dL (ref 125–400)

## 2015-12-24 LAB — FERRITIN: Ferritin: 50 ng/mL (ref 20–288)

## 2015-12-24 NOTE — Patient Instructions (Signed)
Benefiber is good for constipation/diarrhea/irritable bowel syndrome, it helps with weight loss and can help lower your bad cholesterol. Please do 1 TBSP in the morning in water, coffee, or tea. It can take up to a month before you can see a difference with your bowel movements. It is cheapest from [employer], [employer], [employer].   Preventive Care for Adults A healthy lifestyle and preventive care can promote health and wellness. Preventive health guidelines for women include the following key practices.  A routine yearly physical is a good way to check with your health care provider about your health and preventive screening. It is a chance to share any concerns and updates on your health and to receive a thorough exam.  Visit your dentist for a routine exam and preventive care every 6 months. Brush your teeth twice a day and floss once a day. Good oral hygiene prevents tooth decay and gum disease.  The frequency of eye exams is based on your age, health, family medical history, use of contact lenses, and other factors. Follow your health care provider's recommendations for frequency of eye exams.  Eat a healthy diet. Foods like vegetables, fruits, whole grains, low-fat dairy products, and lean protein foods contain the nutrients you need without too many calories. Decrease your intake of foods high in solid fats, added sugars, and salt. Eat the right amount of calories for you.Get information about a proper diet from your health care provider, if necessary.  Regular physical exercise is one of the most important things you can do for your health. Most adults should get at least 150 minutes of moderate-intensity exercise (any activity that increases your heart rate and causes you to sweat) each week. In addition, most adults need muscle-strengthening exercises on 2 or more days a week.  Maintain a healthy weight. The body mass index (BMI) is a screening tool to identify possible weight problems. It provides  an estimate of body fat based on height and weight. Your health care provider can find your BMI and can help you achieve or maintain a healthy weight.For adults 20 years and older:  A BMI below 18.5 is considered underweight.  A BMI of 18.5 to 24.9 is normal.  A BMI of 25 to 29.9 is considered overweight.  A BMI of 30 and above is considered obese.  Maintain normal blood lipids and cholesterol levels by exercising and minimizing your intake of saturated fat. Eat a balanced diet with plenty of fruit and vegetables. If your lipid or cholesterol levels are high, you are over 50, or you are at high risk for heart disease, you may need your cholesterol levels checked more frequently.Ongoing high lipid and cholesterol levels should be treated with medicines if diet and exercise are not working.  If you smoke, find out from your health care provider how to quit. If you do not use tobacco, do not start.  Lung cancer screening is recommended for adults aged 37-80 years who are at high risk for developing lung cancer because of a history of smoking. A yearly low-dose CT scan of the lungs is recommended for people who have at least a 30-pack-year history of smoking and are a current smoker or have quit within the past 15 years. A pack year of smoking is smoking an average of 1 pack of cigarettes a day for 1 year (for example: 1 pack a day for 30 years or 2 packs a day for 15 years). Yearly screening should continue until the smoker has stopped  smoking for at least 15 years. Yearly screening should be stopped for people who develop a health problem that would prevent them from having lung cancer treatment.  Avoid use of street drugs. Do not share needles with anyone. Ask for help if you need support or instructions about stopping the use of drugs.  High blood pressure causes heart disease and increases the risk of stroke.  Ongoing high blood pressure should be treated with medicines if weight loss and  exercise do not work.  If you are 11-4 years old, ask your health care provider if you should take aspirin to prevent strokes.  Diabetes screening involves taking a blood sample to check your fasting blood sugar level. This should be done once every 3 years, after age 54, if you are within normal weight and without risk factors for diabetes. Testing should be considered at a younger age or be carried out more frequently if you are overweight and have at least 1 risk factor for diabetes.  Breast cancer screening is essential preventive care for women. You should practice "breast self-awareness." This means understanding the normal appearance and feel of your breasts and may include breast self-examination. Any changes detected, no matter how small, should be reported to a health care provider. Women in their 48s and 30s should have a clinical breast exam (CBE) by a health care provider as part of a regular health exam every 1 to 3 years. After age 64, women should have a CBE every year. Starting at age 24, women should consider having a mammogram (breast X-ray test) every year. Women who have a family history of breast cancer should talk to their health care provider about genetic screening. Women at a high risk of breast cancer should talk to their health care providers about having an MRI and a mammogram every year.  Breast cancer gene (BRCA)-related cancer risk assessment is recommended for women who have family members with BRCA-related cancers. BRCA-related cancers include breast, ovarian, tubal, and peritoneal cancers. Having family members with these cancers may be associated with an increased risk for harmful changes (mutations) in the breast cancer genes BRCA1 and BRCA2. Results of the assessment will determine the need for genetic counseling and BRCA1 and BRCA2 testing.  Routine pelvic exams to screen for cancer are no longer recommended for nonpregnant women who are considered low risk for  cancer of the pelvic organs (ovaries, uterus, and vagina) and who do not have symptoms. Ask your health care provider if a screening pelvic exam is right for you.  If you have had past treatment for cervical cancer or a condition that could lead to cancer, you need Pap tests and screening for cancer for at least 20 years after your treatment. If Pap tests have been discontinued, your risk factors (such as having a new sexual partner) need to be reassessed to determine if screening should be resumed. Some women have medical problems that increase the chance of getting cervical cancer. In these cases, your health care provider may recommend more frequent screening and Pap tests.    Colorectal cancer can be detected and often prevented. Most routine colorectal cancer screening begins at the age of 75 years and continues through age 48 years. However, your health care provider may recommend screening at an earlier age if you have risk factors for colon cancer. On a yearly basis, your health care provider may provide home test kits to check for hidden blood in the stool. Use of a small camera at  the end of a tube, to directly examine the colon (sigmoidoscopy or colonoscopy), can detect the earliest forms of colorectal cancer. Talk to your health care provider about this at age 57, when routine screening begins. Direct exam of the colon should be repeated every 5-10 years through age 71 years, unless early forms of pre-cancerous polyps or small growths are found.  Osteoporosis is a disease in which the bones lose minerals and strength with aging. This can result in serious bone fractures or breaks. The risk of osteoporosis can be identified using a bone density scan. Women ages 83 years and over and women at risk for fractures or osteoporosis should discuss screening with their health care providers. Ask your health care provider whether you should take a calcium supplement or vitamin D to reduce the rate of  osteoporosis.  Menopause can be associated with physical symptoms and risks. Hormone replacement therapy is available to decrease symptoms and risks. You should talk to your health care provider about whether hormone replacement therapy is right for you.  Use sunscreen. Apply sunscreen liberally and repeatedly throughout the day. You should seek shade when your shadow is shorter than you. Protect yourself by wearing long sleeves, pants, a wide-brimmed hat, and sunglasses year round, whenever you are outdoors.  Once a month, do a whole body skin exam, using a mirror to look at the skin on your back. Tell your health care provider of new moles, moles that have irregular borders, moles that are larger than a pencil eraser, or moles that have changed in shape or color.  Stay current with required vaccines (immunizations).  Influenza vaccine. All adults should be immunized every year.  Tetanus, diphtheria, and acellular pertussis (Td, Tdap) vaccine. Pregnant women should receive 1 dose of Tdap vaccine during each pregnancy. The dose should be obtained regardless of the length of time since the last dose. Immunization is preferred during the 27th-36th week of gestation. An adult who has not previously received Tdap or who does not know her vaccine status should receive 1 dose of Tdap. This initial dose should be followed by tetanus and diphtheria toxoids (Td) booster doses every 10 years. Adults with an unknown or incomplete history of completing a 3-dose immunization series with Td-containing vaccines should begin or complete a primary immunization series including a Tdap dose. Adults should receive a Td booster every 10 years.    Zoster vaccine. One dose is recommended for adults aged 59 years or older unless certain conditions are present.    Pneumococcal 13-valent conjugate (PCV13) vaccine. When indicated, a person who is uncertain of her immunization history and has no record of immunization  should receive the PCV13 vaccine. An adult aged 50 years or older who has certain medical conditions and has not been previously immunized should receive 1 dose of PCV13 vaccine. This PCV13 should be followed with a dose of pneumococcal polysaccharide (PPSV23) vaccine. The PPSV23 vaccine dose should be obtained at least 8 weeks after the dose of PCV13 vaccine. An adult aged 84 years or older who has certain medical conditions and previously received 1 or more doses of PPSV23 vaccine should receive 1 dose of PCV13. The PCV13 vaccine dose should be obtained 1 or more years after the last PPSV23 vaccine dose.    Pneumococcal polysaccharide (PPSV23) vaccine. When PCV13 is also indicated, PCV13 should be obtained first. All adults aged 59 years and older should be immunized. An adult younger than age 17 years who has certain medical conditions should  be immunized. Any person who resides in a nursing home or long-term care facility should be immunized. An adult smoker should be immunized. People with an immunocompromised condition and certain other conditions should receive both PCV13 and PPSV23 vaccines. People with human immunodeficiency virus (HIV) infection should be immunized as soon as possible after diagnosis. Immunization during chemotherapy or radiation therapy should be avoided. Routine use of PPSV23 vaccine is not recommended for American Indians, [place] Natives, or people younger than 65 years unless there are medical conditions that require PPSV23 vaccine. When indicated, people who have unknown immunization and have no record of immunization should receive PPSV23 vaccine. One-time revaccination 5 years after the first dose of PPSV23 is recommended for people aged 19-64 years who have chronic kidney failure, nephrotic syndrome, asplenia, or immunocompromised conditions. People who received 1-2 doses of PPSV23 before age 79 years should receive another dose of PPSV23 vaccine at age 29 years or later if at  least 5 years have passed since the previous dose. Doses of PPSV23 are not needed for people immunized with PPSV23 at or after age 42 years.   Preventive Services / Frequency  Ages 86 years and over  Blood pressure check.  Lipid and cholesterol check.  Lung cancer screening. / Every year if you are aged 83-80 years and have a 30-pack-year history of smoking and currently smoke or have quit within the past 15 years. Yearly screening is stopped once you have quit smoking for at least 15 years or develop a health problem that would prevent you from having lung cancer treatment.  Clinical breast exam.** / Every year after age 52 years.  BRCA-related cancer risk assessment.** / For women who have family members with a BRCA-related cancer (breast, ovarian, tubal, or peritoneal cancers).  Mammogram.** / Every year beginning at age 50 years and continuing for as long as you are in good health. Consult with your health care provider.  Pap test.** / Every 3 years starting at age 63 years through age 91 or 74 years with 3 consecutive normal Pap tests. Testing can be stopped between 65 and 70 years with 3 consecutive normal Pap tests and no abnormal Pap or HPV tests in the past 10 years.  Fecal occult blood test (FOBT) of stool. / Every year beginning at age 70 years and continuing until age 76 years. You may not need to do this test if you get a colonoscopy every 10 years.  Flexible sigmoidoscopy or colonoscopy.** / Every 5 years for a flexible sigmoidoscopy or every 10 years for a colonoscopy beginning at age 26 years and continuing until age 30 years.  Hepatitis C blood test.** / For all people born from [birth year] through 1964 and any individual with known risks for hepatitis C.  Osteoporosis screening.** / A one-time screening for women ages 26 years and over and women at risk for fractures or osteoporosis.  Skin self-exam. / Monthly.  Influenza vaccine. / Every year.  Tetanus, diphtheria, and  acellular pertussis (Tdap/Td) vaccine.** / 1 dose of Td every 10 years.  Zoster vaccine.** / 1 dose for adults aged 56 years or older.  Pneumococcal 13-valent conjugate (PCV13) vaccine.** / Consult your health care provider.  Pneumococcal polysaccharide (PPSV23) vaccine.** / 1 dose for all adults aged 72 years and older. Screening for abdominal aortic aneurysm (AAA)  by ultrasound is recommended for people who have history of high blood pressure or who are current or former smokers.

## 2015-12-24 NOTE — Progress Notes (Signed)
Patient ID: Elizabeth [name], female   DOB: 30-Nov-1945, 70 y.o.   MRN: [id number]  MEDICARE ANNUAL WELLNESS VISIT AND OV  Assessment:   1. Essential hypertension - continue medications, DASH diet, exercise and monitor at home. Call if greater than 130/80.  - CBC with Differential/Platelet - BASIC METABOLIC PANEL WITH GFR - Hepatic function panel  2. Adenocarcinoma of left lung, stage 4 ([place]) Continue follow up with oncology  3. Hypothyroidism, unspecified hypothyroidism type - TSH  4. Prediabetes Discussed general issues about diabetes pathophysiology and management., Educational material distributed., Suggested low cholesterol diet., Encouraged aerobic exercise., Discussed foot care., Reminded to get yearly retinal exam.  5. Hyperlipidemia - Lipid panel  6. Vitamin D deficiency - VITAMIN D 25 Hydroxy (Vit-D Deficiency, Fractures)  7. Obesity - long discussion about weight loss, diet, and exercise  8. Medication management - Magnesium  9. Anxiety continue medications, stress management techniques discussed, increase water, good sleep hygiene discussed, increase exercise, and increase veggies.   10. Encounter for antineoplastic chemotherapy Continue follow up with oncology  11. Insomnia improved  12. Medicare annual wellness visit, subsequent  13. Need for prophylactic vaccination against Streptococcus pneumoniae (pneumococcus) - Pneumococcal conjugate vaccine 13-valent IM  14. Anemia, unspecified anemia type - Iron and TIBC - Ferritin - Vitamin B12   Future Appointments Date Time Provider [place]  01/12/2016 10:00 AM [facility] ONLY [facility] None  01/12/2016 11:00 AM [facility] 2 [facility] [place]  01/19/2016 12:45 PM Curt Bears, MD [facility] None  09/20/2016 2:00 PM Courtney Forcucci, PA-C [facility] None      Plan:   During the course of the visit the patient was educated and counseled about appropriate screening and preventive services  including:    Pneumococcal vaccine   Influenza vaccine  Td vaccine  Screening electrocardiogram  Bone densitometry screening  Colorectal cancer screening  Diabetes screening  Glaucoma screening  Nutrition counseling   Advanced directives: requested  Conditions/risks identified: BMI: Discussed weight loss, diet, and increase physical activity.  Increase physical activity: AHA recommends 150 minutes of physical activity a week.  Medications reviewed PreDiabetes is at goal, ACE/ARB therapy: Not indicated Urinary Incontinence is not an issue: discussed non pharmacology and pharmacology options.  Fall risk: low- discussed PT, home fall assessment, medications.   Subjective:   Elizabeth [name] is a 70 y.o. female who presents for Medicare Annual Wellness Visit and complete physical.  Date of last medicare wellness visit 2016  She has had elevated blood pressure since 1997. Her blood pressure has been controlled at home, & today their BP is BP: 108/60 mmHg She does workout. She denies chest pain, shortness of breath, dizziness.  She has stage 4 adenocarcinoma of the lungs, follows Dr. Julien Nordmann, has been taking oral chemotherapy. She is tolerating the medication okay and will get repeat scan on the  She is on cholesterol medication and denies myalgias. Her cholesterol is at goal. The cholesterol last visit was:  Lab Results  Component Value Date   CHOL 142 09/21/2015   HDL 38* 09/21/2015   LDLCALC 75 09/21/2015   TRIG 144 09/21/2015   CHOLHDL 3.7 09/21/2015   She has had prediabetes for 3 years (A1c 5.7% in 2012). She has been working on diet and exercise for prediabetes, and denies foot ulcerations, hyperglycemia, hypoglycemia , paresthesia of the feet, polydipsia, polyuria and visual disturbances. Last A1C in the office was:  Lab Results  Component Value Date   HGBA1C 5.3 09/21/2015   Patient is  on Vitamin D supplement.   Lab Results  Component Value Date   VD25OH 61  09/21/2015     BMI is Body mass index is 28.28 kg/(m^2)., she is working on diet and exercise. Wt Readings from Last 3 Encounters:  12/24/15 144 lb 12.8 oz (65.681 kg)  12/20/15 143 lb 9.6 oz (65.137 kg)  11/19/15 143 lb 9.6 oz (65.137 kg)   She is on thyroid medication. Her medication was changed last visit.   Lab Results  Component Value Date   TSH 1.927 09/21/2015  .   Names of Other Physician/Practitioners you currently use: 1. [facility] here for primary care 2. Dr Earl Gala , eye doctor, last visit Dec 2016 3. Dr Dalene Carrow, DDS, dentist, last visit Sept 2016  Patient Care Team: [name], MD as PCP - General (Internal Medicine) Sharol Roussel, OD as Physician Assistant (Optometry) Jettie Booze, MD as Consulting Physician (Interventional Cardiology) Teena Irani, MD as Consulting Physician (Gastroenterology) Druscilla Brownie, MD as Consulting Physician (Dermatology) Clent Jacks, MD as Consulting Physician (Ophthalmology) Erroll Luna, MD as Consulting Physician (General Surgery)  Medication Review: Current Outpatient Prescriptions on File Prior to Visit  Medication Sig Dispense Refill  . ALPRAZolam (XANAX) 1 MG tablet Take 1/2 to 1 tablet 3 x day if needed for anxiety 90 tablet 5  . aspirin 81 MG tablet Take 81 mg by mouth at bedtime.     . Cholecalciferol (VITAMIN D3) 10000 UNITS TABS Take by mouth.    . clindamycin (CLEOCIN T) 1 % external solution Apply 1 application topically daily as needed.  0  . GILOTRIF 40 MG tablet TAKE 1 TABLET BY MOUTH ONCE DAILY. TAKE ON AN EMPTY STOMACH 1 HOUR BEFORE OR 2 HOURS AFTER MEALS 30 tablet 2  . HYDROcodone-homatropine (HYCODAN) 5-1.5 MG/5ML syrup Take 5 mLs by mouth every 6 (six) hours as needed for cough. 120 mL 0  . levothyroxine (SYNTHROID, LEVOTHROID) 50 MCG tablet TAKE 1 TABLET BY MOUTH EVERY DAY 90 tablet 0  . montelukast (SINGULAIR) 10 MG tablet TAKE 1 TABLET BY MOUTH EVERY  DAY 90 tablet 0   No current facility-administered medications on file prior to visit.    Current Problems (verified) Patient Active Problem List   Diagnosis Date Noted  . Encounter for antineoplastic chemotherapy 10/19/2015  . Medicare annual wellness visit, subsequent 06/11/2015  . Adenocarcinoma of left lung, stage 4 ([place]) 04/05/2015  . Obesity 02/25/2015  . Medication management 11/03/2014  . Vitamin D deficiency 10/21/2013  . Hypothyroidism 10/21/2013  . Essential hypertension 10/21/2013  . Hyperlipidemia 10/21/2013  . Prediabetes   . Anxiety   . Insomnia    Screening Tests Immunization History  Administered Date(s) Administered  . Influenza, High Dose Seasonal PF 07/31/2014  . Influenza-Unspecified 09/13/2015  . Pneumococcal Polysaccharide-23 07/22/2012  . Td 12/25/2005  . Zoster 01/09/2007   Preventative care: Last colonoscopy: Jan 2014 -recc 10 yr f/u [facility] 10/2015  Prior vaccinations: TD : 12/25/2005 DUE this year Influenza: HD 08/01/2015 Pneumococcal: 9/30 2013 Prevnar: DUE Shingles/Zostavax: 01/09/2007  Allergies Allergies  Allergen Reactions  . Penicillins Swelling and Rash   Surgical history Past Surgical History  Procedure Laterality Date  . Tonsillectomy and adenoidectomy    . Abdominal hysterectomy  1995    w BSO   Family history Family History  Problem Relation Age of Onset  . Liver disease Mother   . Alcohol abuse Mother   . ALS Father   . Hypertension Father  Risk Factors: Tobacco Social History  Substance Use Topics  . Smoking status: Former Smoker -- 0.50 packs/day for 10 years    Quit date: 10/23/1974  . Smokeless tobacco: Never Used  . Alcohol Use: No     Comment: Rare   She does not smoke.  Patient is not a former smoker. Are there smokers in your home (other than you)?  No  Alcohol Current alcohol use: none  Caffeine Current caffeine use: tea 4 glasses /day  Exercise Current exercise: none  Nutrition/Diet Current  diet: in general, a "healthy" diet    Cardiac risk factors: advanced age (older than 62 for men, 9 for women), dyslipidemia, hypertension and sedentary lifestyle.  Depression Screen (Note: if answer to either of the following is "Yes", a more complete depression screening is indicated)   Q1: Over the past two weeks, have you felt down, depressed or hopeless? No  Q2: Over the past two weeks, have you felt little interest or pleasure in doing things? No  Have you lost interest or pleasure in daily life? No  Do you often feel hopeless? No  Do you cry easily over simple problems? No  Activities of Daily Living In your present state of health, do you have any difficulty performing the following activities?:  Driving? No Managing money?  No Feeding yourself? No Getting from bed to chair? No Climbing a flight of stairs? No Preparing food and eating?: No Bathing or showering? No Getting dressed: No Getting to the toilet? No Using the toilet:No Moving around from place to place: No In the past year have you fallen or had a near fall?:No  Vision Difficulties: No  Hearing Difficulties: No Do you often ask people to speak up or repeat themselves? No Do you experience ringing or noises in your ears? No Do you have difficulty understanding soft or whispered voices? Sometimes.  Cognition  Do you feel that you have a problem with memory?No  Do you often misplace items? No  Do you feel safe at home?  Yes  Advanced directives Does patient have a Jay? Yes Does patient have a Living Will? Yes   Objective:     Blood pressure 108/60, pulse 80, temperature 97.3 F (36.3 C), temperature source Temporal, resp. rate 16, height 5' (1.524 m), weight 144 lb 12.8 oz (65.681 kg), SpO2 98 %. Body mass index is 28.28 kg/(m^2).  General appearance: alert, no distress, WD/WN, female Cognitive Testing  Alert? Yes  Normal Appearance?Yes  Oriented to person? Yes  Place?  Yes   Time? Yes  Recall of three objects?  Yes  Can perform simple calculations? Yes  Displays appropriate judgment? Yes  Can read the correct time from a watch/clock?Yes  HEENT: normocephalic, sclerae anicteric, TMs pearly, nares patent, no discharge or erythema, pharynx normal Oral cavity: MMM, no lesions Neck: supple, no lymphadenopathy, no thyromegaly, no masses Heart: RRR, normal S1, S2, no murmurs Lungs: CTA bilaterally, no wheezes, rhonchi, or rales Abdomen: +bs, soft, non tender, non distended, no masses, no hepatomegaly, no splenomegaly Musculoskeletal: nontender, no swelling, no obvious deformity Extremities: no edema, no cyanosis, no clubbing Pulses: 2+ symmetric, upper and lower extremities, normal cap refill Neurological: alert, oriented x 3, CN2-12 intact, strength normal upper extremities and lower extremities, sensation normal throughout, DTRs 2+ throughout, no cerebellar signs, gait normal Psychiatric: normal affect, behavior normal, pleasant   Medicare Attestation I have personally reviewed: The patient's medical and social history Their use of alcohol, tobacco or  illicit drugs Their current medications and supplements The patient's functional ability including ADLs,fall risks, home safety risks, cognitive, and hearing and visual impairment Diet and physical activities Evidence for depression or mood disorders  The patient's weight, height, BMI, and visual acuity have been recorded in the chart.  I have made referrals, counseling, and provided education to the patient based on review of the above and I have provided the patient with a written personalized care plan for preventive services.    Vicie Mutters, PA-C   12/24/2015

## 2015-12-25 LAB — VITAMIN D 25 HYDROXY (VIT D DEFICIENCY, FRACTURES): Vit D, 25-Hydroxy: 97 ng/mL (ref 30–100)

## 2016-01-12 ENCOUNTER — Ambulatory Visit (HOSPITAL_COMMUNITY)
Admission: RE | Admit: 2016-01-12 | Discharge: 2016-01-12 | Disposition: A | Discharge status: Home / Self Care | Payer: Medicare Other | Source: Ambulatory Visit | Attending: Internal Medicine | Admitting: Internal Medicine

## 2016-01-12 ENCOUNTER — Other Ambulatory Visit (HOSPITAL_BASED_OUTPATIENT_CLINIC_OR_DEPARTMENT_OTHER): Payer: Medicare Other

## 2016-01-12 ENCOUNTER — Encounter (HOSPITAL_COMMUNITY): Payer: Self-pay

## 2016-01-12 DIAGNOSIS — C3492 Malignant neoplasm of unspecified part of left bronchus or lung: Secondary | ICD-10-CM | POA: Diagnosis not present

## 2016-01-12 DIAGNOSIS — I700 Atherosclerosis of aorta: Secondary | ICD-10-CM | POA: Diagnosis not present

## 2016-01-12 DIAGNOSIS — M5136 Other intervertebral disc degeneration, lumbar region: Secondary | ICD-10-CM | POA: Diagnosis not present

## 2016-01-12 DIAGNOSIS — C3432 Malignant neoplasm of lower lobe, left bronchus or lung: Secondary | ICD-10-CM | POA: Diagnosis not present

## 2016-01-12 DIAGNOSIS — M47816 Spondylosis without myelopathy or radiculopathy, lumbar region: Secondary | ICD-10-CM | POA: Diagnosis not present

## 2016-01-12 DIAGNOSIS — M954 Acquired deformity of chest and rib: Secondary | ICD-10-CM | POA: Diagnosis not present

## 2016-01-12 DIAGNOSIS — J9811 Atelectasis: Secondary | ICD-10-CM | POA: Insufficient documentation

## 2016-01-12 DIAGNOSIS — D1803 Hemangioma of intra-abdominal structures: Secondary | ICD-10-CM | POA: Insufficient documentation

## 2016-01-12 LAB — COMPREHENSIVE METABOLIC PANEL
ALK PHOS: 68 U/L (ref 40–150)
ALT: 40 U/L (ref 0–55)
ANION GAP: 8 meq/L (ref 3–11)
AST: 23 U/L (ref 5–34)
Albumin: 3.4 g/dL — ABNORMAL LOW (ref 3.5–5.0)
BILIRUBIN TOTAL: 1.2 mg/dL (ref 0.20–1.20)
BUN: 12.4 mg/dL (ref 7.0–26.0)
CALCIUM: 9.2 mg/dL (ref 8.4–10.4)
CHLORIDE: 104 meq/L (ref 98–109)
CO2: 28 mEq/L (ref 22–29)
CREATININE: 0.9 mg/dL (ref 0.6–1.1)
EGFR: 67 mL/min/{1.73_m2} — ABNORMAL LOW (ref >90)
Glucose: 77 mg/dl (ref 70–140)
Potassium: 4.5 mEq/L (ref 3.5–5.1)
Sodium: 141 mEq/L (ref 136–145)
Total Protein: 6.6 g/dL (ref 6.4–8.3)

## 2016-01-12 LAB — CBC WITH DIFFERENTIAL/PLATELET
BASO%: 0.3 % (ref 0.0–2.0)
BASOS ABS: 0 10*3/uL (ref 0.0–0.1)
EOS ABS: 0.3 10*3/uL (ref 0.0–0.5)
EOS%: 3.8 % (ref 0.0–7.0)
HEMATOCRIT: 38.5 % (ref 34.8–46.6)
HGB: 13 g/dL (ref 11.6–15.9)
LYMPH#: 1 10*3/uL (ref 0.9–3.3)
LYMPH%: 14.4 % (ref 14.0–49.7)
MCH: 28.3 pg (ref 25.1–34.0)
MCHC: 33.8 g/dL (ref 31.5–36.0)
MCV: 83.9 fL (ref 79.5–101.0)
MONO#: 0.8 10*3/uL (ref 0.1–0.9)
MONO%: 11.2 % (ref 0.0–14.0)
NEUT#: 4.9 10*3/uL (ref 1.5–6.5)
NEUT%: 70.3 % (ref 38.4–76.8)
PLATELETS: 194 10*3/uL (ref 145–400)
RBC: 4.59 10*6/uL (ref 3.70–5.45)
RDW: 13.1 % (ref 11.2–14.5)
WBC: 6.9 10*3/uL (ref 3.9–10.3)

## 2016-01-12 MED ORDER — IOPAMIDOL (ISOVUE-300) INJECTION 61%
100.0000 mL | Freq: Once | INTRAVENOUS | Status: AC | PRN
  Administered 2016-01-12: 100 mL via INTRAVENOUS

## 2016-01-14 MED FILL — *GILOTRIF 40MG TABLET: 40 | 30 days supply | Qty: 30 | Fill #2

## 2016-01-17 ENCOUNTER — Encounter: Payer: Self-pay | Admitting: Medical Oncology

## 2016-01-17 ENCOUNTER — Other Ambulatory Visit: Payer: Self-pay | Admitting: Medical Oncology

## 2016-01-17 DIAGNOSIS — C3492 Malignant neoplasm of unspecified part of left bronchus or lung: Secondary | ICD-10-CM

## 2016-01-17 DIAGNOSIS — L270 Generalized skin eruption due to drugs and medicaments taken internally: Secondary | ICD-10-CM

## 2016-01-17 HISTORY — DX: Generalized skin eruption due to drugs and medicaments taken internally: L27.0

## 2016-01-17 MED ORDER — CLINDAMYCIN PHOSPHATE 1 % EX SOLN: 1.0000 "application " | Freq: Every day | CUTANEOUS | Status: DC | PRN

## 2016-01-19 ENCOUNTER — Telehealth: Payer: Self-pay | Admitting: Medical Oncology

## 2016-01-19 ENCOUNTER — Encounter: Payer: Self-pay | Admitting: Internal Medicine

## 2016-01-19 ENCOUNTER — Ambulatory Visit (HOSPITAL_BASED_OUTPATIENT_CLINIC_OR_DEPARTMENT_OTHER): Payer: Medicare Other | Admitting: Internal Medicine

## 2016-01-19 ENCOUNTER — Telehealth: Payer: Self-pay | Admitting: Internal Medicine

## 2016-01-19 VITALS — BP 130/74 | HR 80 | Temp 97.9°F | Resp 18 | Ht 60.0 in | Wt 145.8 lb

## 2016-01-19 DIAGNOSIS — Z5111 Encounter for antineoplastic chemotherapy: Secondary | ICD-10-CM

## 2016-01-19 DIAGNOSIS — C3492 Malignant neoplasm of unspecified part of left bronchus or lung: Secondary | ICD-10-CM

## 2016-01-19 DIAGNOSIS — L270 Generalized skin eruption due to drugs and medicaments taken internally: Secondary | ICD-10-CM

## 2016-01-19 DIAGNOSIS — C3432 Malignant neoplasm of lower lobe, left bronchus or lung: Secondary | ICD-10-CM | POA: Diagnosis not present

## 2016-01-19 DIAGNOSIS — C7800 Secondary malignant neoplasm of unspecified lung: Secondary | ICD-10-CM

## 2016-01-19 NOTE — Telephone Encounter (Signed)
Gave and printed appt shced and avs for pt for April °

## 2016-01-19 NOTE — Telephone Encounter (Signed)
Pt did come to appt

## 2016-01-19 NOTE — Progress Notes (Signed)
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  PRIOR THERAPY: None  CURRENT THERAPY: Gilotrif 40 mg by mouth daily. First dose started on 05/11/2015. Status post 8 months of treatment.  INTERVAL HISTORY: Elizabeth [name] 70 y.o. female returns to the clinic today for follow-up visit accompanied by her husband and son. The patient is feeling fine today with no specific complaints except for occasional diarrhea with certain foods in addition to dry skin and these scaly scalp. She has no significant skin rash. She denied having any significant chest pain, shortness of breath, cough or hemoptysis. She has no significant weight loss or night sweats. She denied having any significant nausea or vomiting, no fever or chills. She had repeat CT scan of the chest, abdomen and pelvis as well as CBC and comprehensive metabolic panel and she is here today for evaluation and discussion of her scan and lab results.  MEDICAL HISTORY: Past Medical History  Diagnosis Date  . Hypertension   . Hyperlipidemia   . Thyroid disease   . Prediabetes   . Allergy   . Anxiety   . Insomnia   . Cancer ([place])   . Adenocarcinoma of left lung, stage 4 ([place]) 04/05/2015    Biopsy confirmed CT SCAN: There are innumerable bilateral pulmonary nodules. These range in size from about 5 mm to 2 cm. They demonstrate irregular indistinct borders, the larger ones demonstrating spiculation. PET SCAN: Diffuse pulmonary metastatic disease with a 4 cm dominant left lower low lung lesion. No enlarged or hypermetabolic mediastinal or hilar  adenopathy.  . Drug-induced skin rash 01/17/2016    ALLERGIES:  is allergic to penicillins.  MEDICATIONS:  Current Outpatient Prescriptions  Medication Sig Dispense Refill  . ALPRAZolam (XANAX) 1 MG tablet Take 1/2 to 1 tablet 3 x day if needed for anxiety 90 tablet 5  . aspirin 81 MG tablet Take 81 mg by mouth at bedtime.     . Cholecalciferol (VITAMIN D3) 10000 UNITS TABS Take by mouth.    . clindamycin (CLEOCIN T) 1 % external solution Apply 1 application topically daily as needed. 30 mL 0  . GILOTRIF 40 MG tablet TAKE 1 TABLET BY MOUTH ONCE DAILY. TAKE ON AN EMPTY STOMACH 1 HOUR BEFORE OR 2 HOURS AFTER MEALS 30 tablet 2  . HYDROcodone-homatropine (HYCODAN) 5-1.5 MG/5ML syrup Take 5 mLs by mouth every 6 (six) hours as needed for cough. 120 mL 0  . levothyroxine (SYNTHROID, LEVOTHROID) 50 MCG tablet TAKE 1 TABLET BY MOUTH EVERY DAY 90 tablet 0  . montelukast (SINGULAIR) 10 MG tablet TAKE 1 TABLET BY MOUTH EVERY DAY 90 tablet 0   No current facility-administered medications for this visit.    SURGICAL HISTORY:  Past Surgical History  Procedure Laterality Date  . Tonsillectomy and adenoidectomy    . Abdominal hysterectomy  1995    w BSO    REVIEW OF SYSTEMS:  Constitutional: negative Eyes: negative Ears, nose, mouth, throat, and face: negative Respiratory: negative Cardiovascular: negative Gastrointestinal: negative Genitourinary:negative Integument/breast: positive for dryness Hematologic/lymphatic: negative Musculoskeletal:negative Neurological: negative Behavioral/Psych: negative Endocrine: negative Allergic/Immunologic: negative   PHYSICAL EXAMINATION:  General appearance: alert, cooperative and no distress Head: Normocephalic, without obvious abnormality, atraumatic Neck: no adenopathy, no JVD, supple, symmetrical, trachea midline and thyroid not enlarged, symmetric, no tenderness/mass/nodules Lymph nodes: Cervical, supraclavicular, and axillary nodes normal. Resp:  clear to auscultation bilaterally Back: symmetric, no curvature. ROM normal. No CVA tenderness. Cardio: regular rate and rhythm, S1, S2 normal, no murmur, click, rub or gallop GI: soft, non-tender; bowel sounds normal; no masses,  no organomegaly Extremities: extremities normal, atraumatic, no cyanosis or edema Neurologic: Alert and oriented X 3, normal strength and tone. Normal symmetric reflexes. Normal coordination and gait  ECOG PERFORMANCE STATUS: 0 - Asymptomatic  There were no vitals taken for this visit.  LABORATORY DATA: Lab Results  Component Value Date   WBC 6.9 01/12/2016   HGB 13.0 01/12/2016   HCT 38.5 01/12/2016   MCV 83.9 01/12/2016   PLT 194 01/12/2016      Chemistry      Component Value Date/Time   NA 141 01/12/2016 1012   NA 141 12/24/2015 1151   K 4.5 01/12/2016 1012   K 4.0 12/24/2015 1151   CL 106 12/24/2015 1151   CO2 28 01/12/2016 1012   CO2 29 12/24/2015 1151   BUN 12.4 01/12/2016 1012   BUN 15 12/24/2015 1151   CREATININE 0.9 01/12/2016 1012   CREATININE 0.90 12/24/2015 1151   CREATININE 1.17* 05/13/2015 1855      Component Value Date/Time   CALCIUM 9.2 01/12/2016 1012   CALCIUM 8.7 12/24/2015 1151   ALKPHOS 68 01/12/2016 1012   ALKPHOS 66 12/24/2015 1151   AST 23 01/12/2016 1012   AST 21 12/24/2015 1151   ALT 40 01/12/2016 1012   ALT 40* 12/24/2015 1151   BILITOT 1.20 01/12/2016 1012   BILITOT 1.3* 12/24/2015 1151       RADIOGRAPHIC STUDIES: Ct Chest W Contrast  01/12/2016  CLINICAL DATA:  Restaging of stage IV lung cancer original diagnosis 2014. Chemotherapy completed. EXAM: CT CHEST, ABDOMEN, AND PELVIS WITH CONTRAST TECHNIQUE: Multidetector CT imaging of the chest, abdomen and pelvis was performed following the standard protocol during bolus administration of intravenous contrast. CONTRAST:  139m ISOVUE-300 IOPAMIDOL (ISOVUE-300) INJECTION 61% COMPARISON:  Multiple exams, including 10/12/2015 FINDINGS: CT CHEST FINDINGS  Mediastinum/Nodes: No pathologic thoracic adenopathy. Pectus excavatum, Haller index 2.8. As before there is a small amount of fluid in the superior per cardial recesses. Lungs/Pleura: There has been no change in the appearance of patchy nodularity and architectural distortion in the lungs related to prior treated metastatic lesions. The local atelectasis in the left lower lobe is noted with air bronchograms; a 1.3 by 1.0 cm portion which is stable from previous does not have air bronchograms and could represent residual pulmonary nodule. Some of the nodular densities are associated with volume loss for example the region of right lower lobe architectural distortion and sub solid nodularity on image 30/4. One index focus of nodularity in the right upper lobe measures 1.1 by 0.8 cm on image 23/4, no change based on my measurements. Musculoskeletal: Thoracic spondylosis. CT ABDOMEN PELVIS FINDINGS Hepatobiliary: Stable segment 7 hemangioma.  Otherwise unremarkable. Pancreas: Unremarkable Spleen: Punctate chronic calcification compatible with old granulomatous disease. Adrenals/Urinary Tract: Unremarkable Stomach/Bowel: Unremarkable Vascular/Lymphatic: Aortoiliac atherosclerotic vascular disease. No pathologic adenopathy in the abdomen or pelvis. Reproductive: Hysterectomy.  Ovaries not well seen. Other: No supplemental non-categorized findings. Musculoskeletal: 6 mm degenerative anterolisthesis at L4-5 with resulting impingement. There is also left foraminal stenosis at L5-S1 due to spurring and disc protrusion. Endplate  sclerosis at L5-S1 with loss of disc height. IMPRESSION: 1. Stable CT appearance the chest. Prior pulmonary nodules currently manifest mostly is regions of architectural distortion and faint mild residual nodularity. There is continued focal consolidation/atelectasis in the vicinity of the prior larger left lower lobe mass ; more solid appearance in this vicinity of atelectasis measures 1.3 by 1.0 cm,  previously 1.4 by 1.0 cm. 2. No findings of metastatic disease to the abdomen or pelvis currently. 3. Other imaging findings of potential clinical significance: Pectus excavatum. Hepatic hemangioma. Aortoiliac atherosclerotic vascular disease. Lumbar spondylosis and degenerative disc disease with impingement at L4-5 and L5-S1. Electronically Signed   By: Van Clines M.D.   On: 01/12/2016 14:01   Ct Abdomen Pelvis W Contrast  01/12/2016  CLINICAL DATA:  Restaging of stage IV lung cancer original diagnosis 2014. Chemotherapy completed. EXAM: CT CHEST, ABDOMEN, AND PELVIS WITH CONTRAST TECHNIQUE: Multidetector CT imaging of the chest, abdomen and pelvis was performed following the standard protocol during bolus administration of intravenous contrast. CONTRAST:  129m ISOVUE-300 IOPAMIDOL (ISOVUE-300) INJECTION 61% COMPARISON:  Multiple exams, including 10/12/2015 FINDINGS: CT CHEST FINDINGS Mediastinum/Nodes: No pathologic thoracic adenopathy. Pectus excavatum, Haller index 2.8. As before there is a small amount of fluid in the superior per cardial recesses. Lungs/Pleura: There has been no change in the appearance of patchy nodularity and architectural distortion in the lungs related to prior treated metastatic lesions. The local atelectasis in the left lower lobe is noted with air bronchograms; a 1.3 by 1.0 cm portion which is stable from previous does not have air bronchograms and could represent residual pulmonary nodule. Some of the nodular densities are associated with volume loss for example the region of right lower lobe architectural distortion and sub solid nodularity on image 30/4. One index focus of nodularity in the right upper lobe measures 1.1 by 0.8 cm on image 23/4, no change based on my measurements. Musculoskeletal: Thoracic spondylosis. CT ABDOMEN PELVIS FINDINGS Hepatobiliary: Stable segment 7 hemangioma.  Otherwise unremarkable. Pancreas: Unremarkable Spleen: Punctate chronic  calcification compatible with old granulomatous disease. Adrenals/Urinary Tract: Unremarkable Stomach/Bowel: Unremarkable Vascular/Lymphatic: Aortoiliac atherosclerotic vascular disease. No pathologic adenopathy in the abdomen or pelvis. Reproductive: Hysterectomy.  Ovaries not well seen. Other: No supplemental non-categorized findings. Musculoskeletal: 6 mm degenerative anterolisthesis at L4-5 with resulting impingement. There is also left foraminal stenosis at L5-S1 due to spurring and disc protrusion. Endplate sclerosis at LH4-R7with loss of disc height. IMPRESSION: 1. Stable CT appearance the chest. Prior pulmonary nodules currently manifest mostly is regions of architectural distortion and faint mild residual nodularity. There is continued focal consolidation/atelectasis in the vicinity of the prior larger left lower lobe mass ; more solid appearance in this vicinity of atelectasis measures 1.3 by 1.0 cm, previously 1.4 by 1.0 cm. 2. No findings of metastatic disease to the abdomen or pelvis currently. 3. Other imaging findings of potential clinical significance: Pectus excavatum. Hepatic hemangioma. Aortoiliac atherosclerotic vascular disease. Lumbar spondylosis and degenerative disc disease with impingement at L4-5 and L5-S1. Electronically Signed   By: WVan ClinesM.D.   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 The patient was started on treatment with Gilotrif 40 mg by mouth daily and tolerating it much better after start taking it at nighttime. She status post 8 months of treatment.  The patient  has no significant complaints today except for few episodes of diarrhea with certain foods and dry skin. The recent CT scan of the chest, abdomen and pelvis showed no concerning findings for disease progression. I  discussed the scan results with the patient and her family.  I recommended for her to continue her current treatment with Gilotrif as a scheduled. The patient would come back for follow-up visit in one month for reevaluation and repeat blood work. She was advised to call immediately if she has any concerning symptoms in the interval. The patient voices understanding of current disease status and treatment options and is in agreement with the current care plan.  All questions were answered. The patient knows to call the clinic with any problems, questions or concerns. We can certainly see the patient much sooner if necessary.  Disclaimer: This note was dictated with voice recognition software. Similar sounding words can inadvertently be transcribed and may not be corrected upon review.

## 2016-02-01 ENCOUNTER — Telehealth: Payer: Self-pay | Admitting: *Deleted

## 2016-02-01 DIAGNOSIS — C3492 Malignant neoplasm of unspecified part of left bronchus or lung: Secondary | ICD-10-CM

## 2016-02-01 DIAGNOSIS — L270 Generalized skin eruption due to drugs and medicaments taken internally: Secondary | ICD-10-CM

## 2016-02-01 MED ORDER — CLINDAMYCIN PHOSPHATE 1 % EX SOLN: 1.0000 "application " | Freq: Every day | CUTANEOUS | Status: DC | PRN

## 2016-02-01 NOTE — Telephone Encounter (Signed)
TC from patient requesting refill on her Clindamycin solution for skin breakouts d/t oral chemo Gilotrif.  Refill done

## 2016-02-13 ENCOUNTER — Other Ambulatory Visit: Payer: Self-pay | Admitting: Internal Medicine

## 2016-02-14 ENCOUNTER — Other Ambulatory Visit: Payer: Self-pay | Admitting: Internal Medicine

## 2016-02-17 ENCOUNTER — Other Ambulatory Visit (HOSPITAL_BASED_OUTPATIENT_CLINIC_OR_DEPARTMENT_OTHER): Payer: Medicare Other

## 2016-02-17 ENCOUNTER — Ambulatory Visit (HOSPITAL_BASED_OUTPATIENT_CLINIC_OR_DEPARTMENT_OTHER): Payer: Medicare Other | Admitting: Internal Medicine

## 2016-02-17 ENCOUNTER — Encounter: Payer: Self-pay | Admitting: Internal Medicine

## 2016-02-17 ENCOUNTER — Telehealth: Payer: Self-pay | Admitting: Internal Medicine

## 2016-02-17 VITALS — BP 124/74 | HR 86 | Temp 98.6°F | Resp 18 | Ht 60.0 in | Wt 142.9 lb

## 2016-02-17 DIAGNOSIS — C3492 Malignant neoplasm of unspecified part of left bronchus or lung: Secondary | ICD-10-CM

## 2016-02-17 DIAGNOSIS — C3432 Malignant neoplasm of lower lobe, left bronchus or lung: Secondary | ICD-10-CM | POA: Diagnosis not present

## 2016-02-17 DIAGNOSIS — L270 Generalized skin eruption due to drugs and medicaments taken internally: Secondary | ICD-10-CM

## 2016-02-17 DIAGNOSIS — Z5111 Encounter for antineoplastic chemotherapy: Secondary | ICD-10-CM

## 2016-02-17 DIAGNOSIS — R197 Diarrhea, unspecified: Secondary | ICD-10-CM | POA: Diagnosis not present

## 2016-02-17 DIAGNOSIS — C7800 Secondary malignant neoplasm of unspecified lung: Secondary | ICD-10-CM

## 2016-02-17 DIAGNOSIS — L271 Localized skin eruption due to drugs and medicaments taken internally: Secondary | ICD-10-CM | POA: Diagnosis not present

## 2016-02-17 LAB — CBC WITH DIFFERENTIAL/PLATELET
BASO%: 0.7 % (ref 0.0–2.0)
BASOS ABS: 0.1 10*3/uL (ref 0.0–0.1)
EOS%: 2.1 % (ref 0.0–7.0)
Eosinophils Absolute: 0.2 10*3/uL (ref 0.0–0.5)
HCT: 38 % (ref 34.8–46.6)
HGB: 12.7 g/dL (ref 11.6–15.9)
LYMPH#: 1.1 10*3/uL (ref 0.9–3.3)
LYMPH%: 10.6 % — ABNORMAL LOW (ref 14.0–49.7)
MCH: 27.7 pg (ref 25.1–34.0)
MCHC: 33.4 g/dL (ref 31.5–36.0)
MCV: 82.8 fL (ref 79.5–101.0)
MONO#: 0.8 10*3/uL (ref 0.1–0.9)
MONO%: 8.4 % (ref 0.0–14.0)
NEUT#: 7.8 10*3/uL — ABNORMAL HIGH (ref 1.5–6.5)
NEUT%: 78.2 % — AB (ref 38.4–76.8)
Platelets: 214 10*3/uL (ref 145–400)
RBC: 4.59 10*6/uL (ref 3.70–5.45)
RDW: 13.2 % (ref 11.2–14.5)
WBC: 9.9 10*3/uL (ref 3.9–10.3)

## 2016-02-17 LAB — COMPREHENSIVE METABOLIC PANEL
ALT: 52 U/L (ref 0–55)
AST: 28 U/L (ref 5–34)
Albumin: 3.1 g/dL — ABNORMAL LOW (ref 3.5–5.0)
Alkaline Phosphatase: 117 U/L (ref 40–150)
Anion Gap: 7 mEq/L (ref 3–11)
BUN: 15.1 mg/dL (ref 7.0–26.0)
CHLORIDE: 105 meq/L (ref 98–109)
CO2: 28 meq/L (ref 22–29)
Calcium: 9 mg/dL (ref 8.4–10.4)
Creatinine: 0.9 mg/dL (ref 0.6–1.1)
EGFR: 70 mL/min/{1.73_m2} — AB (ref >90)
GLUCOSE: 85 mg/dL (ref 70–140)
POTASSIUM: 4 meq/L (ref 3.5–5.1)
SODIUM: 140 meq/L (ref 136–145)
Total Bilirubin: 0.94 mg/dL (ref 0.20–1.20)
Total Protein: 6.4 g/dL (ref 6.4–8.3)

## 2016-02-17 MED ORDER — METHYLPREDNISOLONE 4 MG PO TBPK: ORAL_TABLET | ORAL | Status: DC

## 2016-02-17 MED ORDER — DOXYCYCLINE HYCLATE 100 MG PO TABS: 100.0000 mg | ORAL_TABLET | Freq: Two times a day (BID) | ORAL | Status: DC

## 2016-02-17 MED ORDER — AFATINIB DIMALEATE 30 MG PO TABS: 30.0000 mg | ORAL_TABLET | Freq: Every day | ORAL | Status: DC

## 2016-02-17 MED ORDER — CLINDAMYCIN PHOSPHATE 1 % EX SOLN: 1.0000 "application " | Freq: Every day | CUTANEOUS | Status: DC | PRN

## 2016-02-17 MED FILL — *GILOTRIF 30 MG TABLET: 30 | 30 days supply | Qty: 30 | Fill #0

## 2016-02-17 MED FILL — METHYLPREDNISOLONE 4 MG TAB: 4 | 6 days supply | Qty: 21 | Fill #0

## 2016-02-17 MED FILL — DOXYCYCLINE HYCLATE 100 MG: 100 | 10 days supply | Qty: 20 | Fill #0

## 2016-02-17 NOTE — Progress Notes (Signed)
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  PRIOR THERAPY: Gilotrif 40 mg by mouth daily. First dose started on 05/11/2015. Status post 9 months of treatment. This was discontinued on 02/17/2016 secondary to extensive skin rash.   CURRENT THERAPY: Gilotrif 30 mg by mouth daily. First dose start on 02/24/2016.   INTERVAL HISTORY: Elizabeth [name] 70 y.o. female returns to the clinic today for follow-up visit accompanied by her husband and son. The patient developed extensive skin rash on the chest, back, upper and lower extremities as well as face and head. It is a macular rash started 2 weeks ago. There was no change in any of her medication. She also had few episodes of diarrhea recently. She denied having any significant chest pain, shortness of breath, cough or hemoptysis. She has no significant weight loss or night sweats. She denied having any significant nausea or vomiting, no fever or chills. She is here today for evaluation and management of her condition.  MEDICAL HISTORY: Past Medical History  Diagnosis Date  . Hypertension   . Hyperlipidemia   . Thyroid disease   . Prediabetes   . Allergy   . Anxiety   . Insomnia   . Cancer ([place])   . Adenocarcinoma of left lung, stage 4 ([place]) 04/05/2015    Biopsy confirmed CT SCAN: There are innumerable bilateral pulmonary nodules. These range in size from about 5 mm to 2 cm. They demonstrate irregular indistinct borders, the larger ones demonstrating spiculation. PET SCAN: Diffuse pulmonary metastatic disease with a 4 cm dominant left lower  low lung lesion. No enlarged or hypermetabolic mediastinal or hilar adenopathy.  . Drug-induced skin rash 01/17/2016    ALLERGIES:  is allergic to penicillins.  MEDICATIONS:  Current Outpatient Prescriptions  Medication Sig Dispense Refill  . aspirin 81 MG tablet Take 81 mg by mouth at bedtime.     . Cholecalciferol (VITAMIN D3) 10000 UNITS TABS Take by mouth.    . clindamycin (CLEOCIN T) 1 % external solution Apply 1 application topically daily as needed. 30 mL 1  . Cyanocobalamin (VITAMIN B 12 PO) Take 1 tablet by mouth daily.    Marland Kitchen GILOTRIF 40 MG tablet TAKE 1 TABLET BY MOUTH ONCE DAILY. TAKE ON AN EMPTY STOMACH 1 HOUR BEFORE OR 2 HOURS AFTER MEALS 30 tablet 2  . HYDROcodone-homatropine (HYCODAN) 5-1.5 MG/5ML syrup Take 5 mLs by mouth every 6 (six) hours as needed for cough. 120 mL 0  . levothyroxine (SYNTHROID, LEVOTHROID) 50 MCG tablet TAKE 1 TABLET BY MOUTH EVERY DAY 90 tablet 0  . magnesium 30 MG tablet Take 30 mg by mouth 2 (two) times daily.    . montelukast (SINGULAIR) 10 MG tablet TAKE 1 TABLET BY MOUTH EVERY DAY 90 tablet 0   No current facility-administered medications for this visit.    SURGICAL HISTORY:  Past Surgical History  Procedure Laterality Date  . Tonsillectomy and adenoidectomy    . Abdominal hysterectomy  1995    w BSO    REVIEW OF SYSTEMS:  Constitutional: negative Eyes: negative Ears, nose, mouth, throat, and face: negative Respiratory: negative Cardiovascular:  negative Gastrointestinal: negative Genitourinary:negative Integument/breast: positive for dryness, pruritus and rash Hematologic/lymphatic: negative Musculoskeletal:negative Neurological: negative Behavioral/Psych: negative Endocrine: negative Allergic/Immunologic: negative   PHYSICAL EXAMINATION: General appearance: alert, cooperative and no distress Head: Normocephalic, without obvious abnormality, atraumatic Neck: no adenopathy, no JVD, supple, symmetrical, trachea midline and thyroid  not enlarged, symmetric, no tenderness/mass/nodules Lymph nodes: Cervical, supraclavicular, and axillary nodes normal. Resp: clear to auscultation bilaterally Back: symmetric, no curvature. ROM normal. No CVA tenderness. Cardio: regular rate and rhythm, S1, S2 normal, no murmur, click, rub or gallop GI: soft, non-tender; bowel sounds normal; no masses,  no organomegaly Extremities: extremities normal, atraumatic, no cyanosis or edema Neurologic: Alert and oriented X 3, normal strength and tone. Normal symmetric reflexes. Normal coordination and gait  Skin exam: Extensive macular skin rash on the face, chest, back as well as upper and lower extremities.  ECOG PERFORMANCE STATUS: 0 - Asymptomatic  Blood pressure 124/74, pulse 86, temperature 98.6 F (37 C), temperature source Oral, resp. rate 18, height 5' (1.524 m), weight 142 lb 14.4 oz (64.819 kg), SpO2 99 %.  LABORATORY DATA: Lab Results  Component Value Date   WBC 9.9 02/17/2016   HGB 12.7 02/17/2016   HCT 38.0 02/17/2016   MCV 82.8 02/17/2016   PLT 214 02/17/2016      Chemistry      Component Value Date/Time   NA 141 01/12/2016 1012   NA 141 12/24/2015 1151   K 4.5 01/12/2016 1012   K 4.0 12/24/2015 1151   CL 106 12/24/2015 1151   CO2 28 01/12/2016 1012   CO2 29 12/24/2015 1151   BUN 12.4 01/12/2016 1012   BUN 15 12/24/2015 1151   CREATININE 0.9 01/12/2016 1012   CREATININE 0.90 12/24/2015 1151   CREATININE 1.17* 05/13/2015 1855      Component Value Date/Time   CALCIUM 9.2 01/12/2016 1012   CALCIUM 8.7 12/24/2015 1151   ALKPHOS 68 01/12/2016 1012   ALKPHOS 66 12/24/2015 1151   AST 23 01/12/2016 1012   AST 21 12/24/2015 1151   ALT 40 01/12/2016 1012   ALT 40* 12/24/2015 1151   BILITOT 1.20 01/12/2016 1012   BILITOT 1.3* 12/24/2015 1151       RADIOGRAPHIC STUDIES: No results found.  ASSESSMENT AND PLAN: This is a very pleasant 70 years old white female recently diagnosed with a stage IV non-small cell lung  cancer, adenocarcinoma with positive EGFR mutation with deletion in exon 19. The patient was started on treatment with Gilotrif 40 mg by mouth daily and tolerating it much better after start taking it at nighttime. She status post 9 months of treatment. This was discontinued today secondary to significant skin rash. The patient presented today with extensive rash all over her body. She also had few episodes of diarrhea. I recommended for her to hold her treatment with Gilotrif for 7 days. I will reduce the dose of Gilotrif to 30 mg by mouth daily starting next week. For the extensive skin rash, I will start the patient on Medrol Dosepak in addition to doxycycline 100 mg by mouth twice a day for 10 days. She will also continue with the clindamycin lotion. For the diarrhea, the patient will continue with the Imodium. The patient would come back for follow-up visit in one month for reevaluation and repeat blood work. She was advised to call immediately if she has any concerning symptoms in the interval. The patient voices understanding of current disease status and treatment options and is in agreement with the  current care plan.  All questions were answered. The patient knows to call the clinic with any problems, questions or concerns. We can certainly see the patient much sooner if necessary.  Disclaimer: This note was dictated with voice recognition software. Similar sounding words can inadvertently be transcribed and may not be corrected upon review.

## 2016-02-17 NOTE — Telephone Encounter (Signed)
Gave and printed appt sched and avs for pt for may °

## 2016-02-25 ENCOUNTER — Telehealth: Payer: Self-pay | Admitting: Internal Medicine

## 2016-02-25 NOTE — Telephone Encounter (Signed)
S/w pt, advised appt chg from 5/23 to 6/5 @ 1.30 due to md pal. Pt verbalized understanding.

## 2016-03-14 ENCOUNTER — Other Ambulatory Visit: Payer: Medicare Other

## 2016-03-14 ENCOUNTER — Ambulatory Visit: Payer: Medicare Other | Admitting: Internal Medicine

## 2016-03-20 ENCOUNTER — Other Ambulatory Visit: Payer: Self-pay | Admitting: Internal Medicine

## 2016-03-22 MED FILL — *GILOTRIF 30 MG TABLET: 30 | 30 days supply | Qty: 30 | Fill #1

## 2016-03-24 ENCOUNTER — Ambulatory Visit (INDEPENDENT_AMBULATORY_CARE_PROVIDER_SITE_OTHER): Payer: Medicare Other | Admitting: *Deleted

## 2016-03-24 DIAGNOSIS — R300 Dysuria: Secondary | ICD-10-CM | POA: Diagnosis not present

## 2016-03-24 LAB — URINALYSIS, ROUTINE W REFLEX MICROSCOPIC
BILIRUBIN URINE: NEGATIVE
Glucose, UA: NEGATIVE
KETONES UR: NEGATIVE
Nitrite: NEGATIVE
PH: 6 (ref 5.0–8.0)
SPECIFIC GRAVITY, URINE: 1.016 (ref 1.001–1.035)

## 2016-03-24 LAB — URINALYSIS, MICROSCOPIC ONLY
CASTS: NONE SEEN [LPF]
CRYSTALS: NONE SEEN [HPF]
Squamous Epithelial / LPF: NONE SEEN [HPF] (ref <=5)
Yeast: NONE SEEN [HPF]

## 2016-03-24 MED ORDER — CIPROFLOXACIN HCL 500 MG PO TABS: 500.0000 mg | ORAL_TABLET | Freq: Two times a day (BID) | ORAL | Status: AC

## 2016-03-24 NOTE — Progress Notes (Signed)
Patient ID: Elizabeth [name], female   DOB: [birth year], 71 y.o.   MRN: [id number] Patient presents to check UA, C&S with c/o increasing dysuria, increased urine frequency and urgency, with decreased urine output.  Per Dr. Idell Pickles orders, Cipro 500 mg BID times 10 days sent into pharmacy for patient to start over the weekend, and advised pt we may need to change abx once we receive culture results.  Patient expressed understanding.

## 2016-03-26 LAB — URINE CULTURE: Colony Count: >=100000

## 2016-03-27 ENCOUNTER — Other Ambulatory Visit (HOSPITAL_BASED_OUTPATIENT_CLINIC_OR_DEPARTMENT_OTHER): Payer: Medicare Other

## 2016-03-27 ENCOUNTER — Other Ambulatory Visit: Payer: Self-pay | Admitting: Medical Oncology

## 2016-03-27 ENCOUNTER — Encounter: Payer: Self-pay | Admitting: Internal Medicine

## 2016-03-27 ENCOUNTER — Ambulatory Visit (HOSPITAL_BASED_OUTPATIENT_CLINIC_OR_DEPARTMENT_OTHER): Payer: Medicare Other | Admitting: Internal Medicine

## 2016-03-27 ENCOUNTER — Telehealth: Payer: Self-pay | Admitting: Internal Medicine

## 2016-03-27 VITALS — BP 138/78 | HR 74 | Temp 97.8°F | Resp 18 | Ht 60.0 in | Wt 142.2 lb

## 2016-03-27 DIAGNOSIS — R197 Diarrhea, unspecified: Secondary | ICD-10-CM

## 2016-03-27 DIAGNOSIS — Z5111 Encounter for antineoplastic chemotherapy: Secondary | ICD-10-CM

## 2016-03-27 DIAGNOSIS — C7800 Secondary malignant neoplasm of unspecified lung: Secondary | ICD-10-CM

## 2016-03-27 DIAGNOSIS — C3432 Malignant neoplasm of lower lobe, left bronchus or lung: Secondary | ICD-10-CM

## 2016-03-27 DIAGNOSIS — C3492 Malignant neoplasm of unspecified part of left bronchus or lung: Secondary | ICD-10-CM

## 2016-03-27 DIAGNOSIS — L270 Generalized skin eruption due to drugs and medicaments taken internally: Secondary | ICD-10-CM

## 2016-03-27 LAB — COMPREHENSIVE METABOLIC PANEL
ALT: 45 U/L (ref 0–55)
AST: 26 U/L (ref 5–34)
Albumin: 3.3 g/dL — ABNORMAL LOW (ref 3.5–5.0)
Alkaline Phosphatase: 81 U/L (ref 40–150)
Anion Gap: 7 mEq/L (ref 3–11)
BILIRUBIN TOTAL: 0.91 mg/dL (ref 0.20–1.20)
BUN: 17.2 mg/dL (ref 7.0–26.0)
CO2: 26 meq/L (ref 22–29)
CREATININE: 1 mg/dL (ref 0.6–1.1)
Calcium: 8.8 mg/dL (ref 8.4–10.4)
Chloride: 105 mEq/L (ref 98–109)
EGFR: 61 mL/min/{1.73_m2} — AB (ref >90)
GLUCOSE: 81 mg/dL (ref 70–140)
Potassium: 3.9 mEq/L (ref 3.5–5.1)
SODIUM: 137 meq/L (ref 136–145)
TOTAL PROTEIN: 6.6 g/dL (ref 6.4–8.3)

## 2016-03-27 LAB — CBC WITH DIFFERENTIAL/PLATELET
BASO%: 0.7 % (ref 0.0–2.0)
Basophils Absolute: 0.1 10*3/uL (ref 0.0–0.1)
EOS ABS: 0.2 10*3/uL (ref 0.0–0.5)
EOS%: 2.6 % (ref 0.0–7.0)
HCT: 37.5 % (ref 34.8–46.6)
HEMOGLOBIN: 12.5 g/dL (ref 11.6–15.9)
LYMPH%: 15 % (ref 14.0–49.7)
MCH: 27.3 pg (ref 25.1–34.0)
MCHC: 33.4 g/dL (ref 31.5–36.0)
MCV: 81.8 fL (ref 79.5–101.0)
MONO#: 0.7 10*3/uL (ref 0.1–0.9)
MONO%: 9.6 % (ref 0.0–14.0)
NEUT%: 72.1 % (ref 38.4–76.8)
NEUTROS ABS: 5.6 10*3/uL (ref 1.5–6.5)
PLATELETS: 211 10*3/uL (ref 145–400)
RBC: 4.58 10*6/uL (ref 3.70–5.45)
RDW: 13.8 % (ref 11.2–14.5)
WBC: 7.7 10*3/uL (ref 3.9–10.3)
lymph#: 1.2 10*3/uL (ref 0.9–3.3)

## 2016-03-27 MED ORDER — METHYLPREDNISOLONE 4 MG PO TBPK: ORAL_TABLET | ORAL | Status: DC

## 2016-03-27 MED ORDER — HYDROCODONE-HOMATROPINE 5-1.5 MG/5ML PO SYRP: 5.0000 mL | ORAL_SOLUTION | Freq: Four times a day (QID) | ORAL | Status: DC | PRN

## 2016-03-27 MED ORDER — CLINDAMYCIN PHOSPHATE 1 % EX SOLN: 1.0000 "application " | Freq: Every day | CUTANEOUS | Status: DC | PRN

## 2016-03-27 NOTE — Telephone Encounter (Signed)
Gave pt apt & avs °

## 2016-03-27 NOTE — Progress Notes (Signed)
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  PRIOR THERAPY: Gilotrif 40 mg by mouth daily. First dose started on 05/11/2015. Status post 9 months of treatment. This was discontinued on 02/17/2016 secondary to extensive skin rash.   CURRENT THERAPY: Gilotrif 30 mg by mouth daily. First dose start on 02/24/2016.   INTERVAL HISTORY: Elizabeth [name] 70 y.o. female returns to the clinic today for follow-up visit. The patient is still having papular skin rash on the lower extremity. It is improved a little bit after treatment with doxycycline. She is requesting refill of clindamycin today. She also had few episodes of diarrhea recently. She denied having any significant chest pain, shortness of breath, cough or hemoptysis. She has no significant weight loss or night sweats. She denied having any significant nausea or vomiting, no fever or chills. She is here today for evaluation and management of her condition.  MEDICAL HISTORY: Past Medical History  Diagnosis Date  . Hypertension   . Hyperlipidemia   . Thyroid disease   . Prediabetes   . Allergy   . Anxiety   . Insomnia   . Cancer ([place])   . Adenocarcinoma of left lung, stage 4 ([facility]) 04/05/2015    Biopsy confirmed CT SCAN: There are innumerable bilateral pulmonary nodules. These range in size from about 5 mm to 2 cm. They demonstrate irregular indistinct borders, the larger ones demonstrating spiculation. PET SCAN: Diffuse pulmonary metastatic disease with a 4 cm dominant left lower low lung lesion. No enlarged or hypermetabolic mediastinal  or hilar adenopathy.  . Drug-induced skin rash 01/17/2016    ALLERGIES:  is allergic to penicillins.  MEDICATIONS:  Current Outpatient Prescriptions  Medication Sig Dispense Refill  . afatinib dimaleate (GILOTRIF) 30 MG tablet Take 1 tablet (30 mg total) by mouth daily. Take on an empty stomach 1hr before or 2 hrs after meals. 30 tablet 2  . aspirin 81 MG tablet Take 81 mg by mouth at bedtime.     . Cholecalciferol (VITAMIN D3) 10000 UNITS TABS Take by mouth.    . ciprofloxacin (CIPRO) 500 MG tablet Take 1 tablet (500 mg total) by mouth 2 (two) times daily. 20 tablet 0  . clindamycin (CLEOCIN T) 1 % external solution Apply 1 application topically daily as needed. 30 mL 1  . Cyanocobalamin (VITAMIN B 12 PO) Take 1 tablet by mouth daily.    Marland Kitchen HYDROcodone-homatropine (HYCODAN) 5-1.5 MG/5ML syrup Take 5 mLs by mouth every 6 (six) hours as needed for cough. 120 mL 0  . hydrocortisone cream 0.5 % Apply 1 application topically 2 (two) times daily.    Marland Kitchen levothyroxine (SYNTHROID, LEVOTHROID) 50 MCG tablet TAKE 1 TABLET BY MOUTH EVERY DAY 90 tablet 0  . magnesium 30 MG tablet Take 30 mg by mouth 2 (two) times daily.    . montelukast (SINGULAIR) 10 MG tablet TAKE 1 TABLET BY MOUTH EVERY DAY 90 tablet 1  . Triprolidine-Pseudoephedrine (ANTIHISTAMINE PO) Take by mouth.     No current facility-administered medications for this visit.    SURGICAL HISTORY:  Past Surgical History  Procedure Laterality Date  . Tonsillectomy  and adenoidectomy    . Abdominal hysterectomy  1995    w BSO    REVIEW OF SYSTEMS:  A comprehensive review of systems was negative except for: Gastrointestinal: positive for diarrhea Integument/breast: positive for rash   PHYSICAL EXAMINATION: General appearance: alert, cooperative and no distress Head: Normocephalic, without obvious abnormality, atraumatic Neck: no adenopathy, no JVD, supple, symmetrical, trachea midline and thyroid not enlarged, symmetric, no  tenderness/mass/nodules Lymph nodes: Cervical, supraclavicular, and axillary nodes normal. Resp: clear to auscultation bilaterally Back: symmetric, no curvature. ROM normal. No CVA tenderness. Cardio: regular rate and rhythm, S1, S2 normal, no murmur, click, rub or gallop GI: soft, non-tender; bowel sounds normal; no masses,  no organomegaly Extremities: extremities normal, atraumatic, no cyanosis or edema Neurologic: Alert and oriented X 3, normal strength and tone. Normal symmetric reflexes. Normal coordination and gait  Skin exam: Extensive papular skin rash on the face, chest, back as well as upper and lower extremities.  ECOG PERFORMANCE STATUS: 0 - Asymptomatic  Blood pressure 138/78, pulse 74, temperature 97.8 F (36.6 C), temperature source Oral, resp. rate 18, height 5' (1.524 m), weight 142 lb 3.2 oz (64.501 kg), SpO2 100 %.  LABORATORY DATA: Lab Results  Component Value Date   WBC 7.7 03/27/2016   HGB 12.5 03/27/2016   HCT 37.5 03/27/2016   MCV 81.8 03/27/2016   PLT 211 03/27/2016      Chemistry      Component Value Date/Time   NA 137 03/27/2016 1348   NA 141 12/24/2015 1151   K 3.9 03/27/2016 1348   K 4.0 12/24/2015 1151   CL 106 12/24/2015 1151   CO2 26 03/27/2016 1348   CO2 29 12/24/2015 1151   BUN 17.2 03/27/2016 1348   BUN 15 12/24/2015 1151   CREATININE 1.0 03/27/2016 1348   CREATININE 0.90 12/24/2015 1151   CREATININE 1.17* 05/13/2015 1855      Component Value Date/Time   CALCIUM 8.8 03/27/2016 1348   CALCIUM 8.7 12/24/2015 1151   ALKPHOS 81 03/27/2016 1348   ALKPHOS 66 12/24/2015 1151   AST 26 03/27/2016 1348   AST 21 12/24/2015 1151   ALT 45 03/27/2016 1348   ALT 40* 12/24/2015 1151   BILITOT 0.91 03/27/2016 1348   BILITOT 1.3* 12/24/2015 1151       RADIOGRAPHIC STUDIES: No results found.  ASSESSMENT AND PLAN: This is a very pleasant 70 years old white female recently diagnosed with a stage IV non-small cell lung cancer, adenocarcinoma  with positive EGFR mutation with deletion in exon 19. The patient was started on treatment with Gilotrif 40 mg by mouth daily and tolerating it much better after start taking it at nighttime. She status post 9 months of treatment. This was discontinued today secondary to significant skin rash.  She also had few episodes of diarrhea as well as papular skin rash on the lower extremities.. I will start the patient on Medrol Dosepak for the skin rash. I will reduce the dose of Gilotrif to 30 mg by mouth daily starting next week. She will also continue with the clindamycin lotion. For the diarrhea, the patient will continue with the Imodium. The patient would come back for follow-up visit in one month for reevaluation and repeat blood work in addition to CT scan of the chest, abdomen and pelvis for restaging of her disease. She was advised to call immediately if she has any concerning symptoms in the interval. The patient voices understanding of current disease status and treatment options and  is in agreement with the current care plan.  All questions were answered. The patient knows to call the clinic with any problems, questions or concerns. We can certainly see the patient much sooner if necessary.  Disclaimer: This note was dictated with voice recognition software. Similar sounding words can inadvertently be transcribed and may not be corrected upon review.

## 2016-04-17 ENCOUNTER — Other Ambulatory Visit (HOSPITAL_BASED_OUTPATIENT_CLINIC_OR_DEPARTMENT_OTHER): Payer: Medicare Other

## 2016-04-17 ENCOUNTER — Encounter: Payer: Self-pay | Admitting: Internal Medicine

## 2016-04-17 ENCOUNTER — Ambulatory Visit (HOSPITAL_COMMUNITY)
Admission: RE | Admit: 2016-04-17 | Discharge: 2016-04-17 | Disposition: A | Discharge status: Home / Self Care | Payer: Medicare Other | Source: Ambulatory Visit | Attending: Internal Medicine | Admitting: Internal Medicine

## 2016-04-17 ENCOUNTER — Encounter (HOSPITAL_COMMUNITY): Payer: Self-pay

## 2016-04-17 DIAGNOSIS — D1803 Hemangioma of intra-abdominal structures: Secondary | ICD-10-CM | POA: Diagnosis not present

## 2016-04-17 DIAGNOSIS — M47816 Spondylosis without myelopathy or radiculopathy, lumbar region: Secondary | ICD-10-CM | POA: Diagnosis not present

## 2016-04-17 DIAGNOSIS — Z9221 Personal history of antineoplastic chemotherapy: Secondary | ICD-10-CM | POA: Diagnosis not present

## 2016-04-17 DIAGNOSIS — I700 Atherosclerosis of aorta: Secondary | ICD-10-CM | POA: Insufficient documentation

## 2016-04-17 DIAGNOSIS — C3492 Malignant neoplasm of unspecified part of left bronchus or lung: Secondary | ICD-10-CM

## 2016-04-17 DIAGNOSIS — Z5111 Encounter for antineoplastic chemotherapy: Secondary | ICD-10-CM

## 2016-04-17 LAB — COMPREHENSIVE METABOLIC PANEL
ALT: 47 U/L (ref 0–55)
AST: 24 U/L (ref 5–34)
Albumin: 3.5 g/dL (ref 3.5–5.0)
Alkaline Phosphatase: 69 U/L (ref 40–150)
Anion Gap: 8 mEq/L (ref 3–11)
BILIRUBIN TOTAL: 1.61 mg/dL — AB (ref 0.20–1.20)
BUN: 16.3 mg/dL (ref 7.0–26.0)
CO2: 28 meq/L (ref 22–29)
CREATININE: 0.9 mg/dL (ref 0.6–1.1)
Calcium: 9.4 mg/dL (ref 8.4–10.4)
Chloride: 106 mEq/L (ref 98–109)
EGFR: 70 mL/min/{1.73_m2} — ABNORMAL LOW (ref >90)
GLUCOSE: 61 mg/dL — AB (ref 70–140)
Potassium: 4 mEq/L (ref 3.5–5.1)
SODIUM: 142 meq/L (ref 136–145)
TOTAL PROTEIN: 7 g/dL (ref 6.4–8.3)

## 2016-04-17 LAB — CBC WITH DIFFERENTIAL/PLATELET
BASO%: 0.6 % (ref 0.0–2.0)
Basophils Absolute: 0 10*3/uL (ref 0.0–0.1)
EOS%: 7.6 % — ABNORMAL HIGH (ref 0.0–7.0)
Eosinophils Absolute: 0.5 10*3/uL (ref 0.0–0.5)
HCT: 38.8 % (ref 34.8–46.6)
HGB: 12.8 g/dL (ref 11.6–15.9)
LYMPH%: 16 % (ref 14.0–49.7)
MCH: 27.6 pg (ref 25.1–34.0)
MCHC: 33 g/dL (ref 31.5–36.0)
MCV: 83.8 fL (ref 79.5–101.0)
MONO#: 0.7 10*3/uL (ref 0.1–0.9)
MONO%: 10.8 % (ref 0.0–14.0)
NEUT%: 65 % (ref 38.4–76.8)
NEUTROS ABS: 4 10*3/uL (ref 1.5–6.5)
Platelets: 169 10*3/uL (ref 145–400)
RBC: 4.63 10*6/uL (ref 3.70–5.45)
RDW: 13.7 % (ref 11.2–14.5)
WBC: 6.2 10*3/uL (ref 3.9–10.3)
lymph#: 1 10*3/uL (ref 0.9–3.3)

## 2016-04-17 MED ORDER — IOPAMIDOL (ISOVUE-300) INJECTION 61%
100.0000 mL | Freq: Once | INTRAVENOUS | Status: AC | PRN
  Administered 2016-04-17: 100 mL via INTRAVENOUS

## 2016-04-20 MED FILL — *GILOTRIF 30 MG TABLET: 30 | 30 days supply | Qty: 30 | Fill #2

## 2016-04-24 ENCOUNTER — Other Ambulatory Visit: Payer: Self-pay | Admitting: Internal Medicine

## 2016-04-26 ENCOUNTER — Ambulatory Visit (HOSPITAL_BASED_OUTPATIENT_CLINIC_OR_DEPARTMENT_OTHER): Payer: Medicare Other | Admitting: Internal Medicine

## 2016-04-26 ENCOUNTER — Telehealth: Payer: Self-pay | Admitting: Internal Medicine

## 2016-04-26 ENCOUNTER — Encounter: Payer: Self-pay | Admitting: Internal Medicine

## 2016-04-26 VITALS — BP 138/82 | HR 78 | Temp 97.7°F | Resp 18 | Ht 60.0 in | Wt 144.5 lb

## 2016-04-26 DIAGNOSIS — C3492 Malignant neoplasm of unspecified part of left bronchus or lung: Secondary | ICD-10-CM

## 2016-04-26 DIAGNOSIS — R197 Diarrhea, unspecified: Secondary | ICD-10-CM

## 2016-04-26 DIAGNOSIS — L270 Generalized skin eruption due to drugs and medicaments taken internally: Secondary | ICD-10-CM | POA: Diagnosis not present

## 2016-04-26 DIAGNOSIS — C3432 Malignant neoplasm of lower lobe, left bronchus or lung: Secondary | ICD-10-CM | POA: Diagnosis not present

## 2016-04-26 DIAGNOSIS — C7800 Secondary malignant neoplasm of unspecified lung: Secondary | ICD-10-CM | POA: Diagnosis not present

## 2016-04-26 DIAGNOSIS — Z5111 Encounter for antineoplastic chemotherapy: Secondary | ICD-10-CM

## 2016-04-26 NOTE — Progress Notes (Signed)
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  PRIOR THERAPY: Gilotrif 40 mg by mouth daily. First dose started on 05/11/2015. Status post 9 months of treatment. This was discontinued on 02/17/2016 secondary to extensive skin rash.   CURRENT THERAPY: Gilotrif 30 mg by mouth daily. First dose start on 02/24/2016. Status post 2 months of treatment.  INTERVAL HISTORY: Elizabeth [name] 70 y.o. female returns to the clinic today for follow-up visit accompanied by her husband. The patient is feeling much better today was no specific complaints. She also had few episodes of diarrhea recently. She denied having any significant chest pain, shortness of breath, cough or hemoptysis. She has no significant weight loss or night sweats. She denied having any significant nausea or vomiting, no fever or chills. She had repeat CT scan of the chest, abdomen and pelvis performed recently and she is here for evaluation and discussion of her scan results.  MEDICAL HISTORY: Past Medical History  Diagnosis Date  . Hypertension   . Hyperlipidemia   . Thyroid disease   . Prediabetes   . Allergy   . Anxiety   . Insomnia   . Cancer ([place])   . Adenocarcinoma of left lung, stage 4 ([facility]) 04/05/2015    Biopsy confirmed CT SCAN: There are innumerable bilateral pulmonary nodules. These range in size from about 5 mm to 2 cm. They demonstrate irregular indistinct borders, the larger ones demonstrating spiculation. PET SCAN: Diffuse pulmonary metastatic disease with a 4 cm dominant left lower low lung lesion. No enlarged or  hypermetabolic mediastinal or hilar adenopathy.  . Drug-induced skin rash 01/17/2016    ALLERGIES:  is allergic to penicillins.  MEDICATIONS:  Current Outpatient Prescriptions  Medication Sig Dispense Refill  . afatinib dimaleate (GILOTRIF) 30 MG tablet Take 1 tablet (30 mg total) by mouth daily. Take on an empty stomach 1hr before or 2 hrs after meals. 30 tablet 2  . aspirin 81 MG tablet Take 81 mg by mouth at bedtime.     . cephALEXin (KEFLEX) 500 MG capsule TK ONE C PO Q 8 H FOR 10 DAYS  0  . Cholecalciferol (VITAMIN D3) 10000 UNITS TABS Take by mouth.    . clindamycin (CLEOCIN T) 1 % external solution Apply 1 application topically daily as needed. 30 mL 1  . Cyanocobalamin (VITAMIN B 12 PO) Take 1 tablet by mouth daily.    . hydrocortisone cream 0.5 % Apply 1 application topically 2 (two) times daily.    Marland Kitchen levothyroxine (SYNTHROID, LEVOTHROID) 50 MCG tablet TAKE 1 TABLET BY MOUTH EVERY DAY 90 tablet 0  . magnesium 30 MG tablet Take 30 mg by mouth 2 (two) times daily.    . montelukast (SINGULAIR) 10 MG tablet TAKE 1 TABLET BY MOUTH EVERY DAY 90 tablet 1  . ALPRAZolam (XANAX) 1 MG tablet TAKE 1/2 TO 1 TABLET BY MOUTH THREE TIMES DAILY AS NEEDED FOR ANXIETY (Patient not taking: Reported on 04/26/2016) 270 tablet 1  . HYDROcodone-homatropine (HYCODAN) 5-1.5 MG/5ML syrup Take 5 mLs by mouth every 6 (six) hours as needed for cough. (Patient not taking: Reported on 04/26/2016)  120 mL 0  . Triprolidine-Pseudoephedrine (ANTIHISTAMINE PO) Take by mouth. Reported on 04/26/2016     No current facility-administered medications for this visit.    SURGICAL HISTORY:  Past Surgical History  Procedure Laterality Date  . Tonsillectomy and adenoidectomy    . Abdominal hysterectomy  1995    w BSO    REVIEW OF SYSTEMS:  Constitutional: negative Eyes: negative Ears, nose, mouth, throat, and face: negative Respiratory: negative Cardiovascular: negative Gastrointestinal: positive for  diarrhea Genitourinary:negative Integument/breast: negative Hematologic/lymphatic: negative Musculoskeletal:negative Neurological: negative Behavioral/Psych: negative Endocrine: negative Allergic/Immunologic: negative   PHYSICAL EXAMINATION: General appearance: alert, cooperative and no distress Head: Normocephalic, without obvious abnormality, atraumatic Neck: no adenopathy, no JVD, supple, symmetrical, trachea midline and thyroid not enlarged, symmetric, no tenderness/mass/nodules Lymph nodes: Cervical, supraclavicular, and axillary nodes normal. Resp: clear to auscultation bilaterally Back: symmetric, no curvature. ROM normal. No CVA tenderness. Cardio: regular rate and rhythm, S1, S2 normal, no murmur, click, rub or gallop GI: soft, non-tender; bowel sounds normal; no masses,  no organomegaly Extremities: extremities normal, atraumatic, no cyanosis or edema Neurologic: Alert and oriented X 3, normal strength and tone. Normal symmetric reflexes. Normal coordination and gait   ECOG PERFORMANCE STATUS: 0 - Asymptomatic  Blood pressure 138/82, pulse 78, temperature 97.7 F (36.5 C), temperature source Oral, resp. rate 18, height 5' (1.524 m), weight 144 lb 8 oz (65.545 kg), SpO2 100 %.  LABORATORY DATA: Lab Results  Component Value Date   WBC 6.2 04/17/2016   HGB 12.8 04/17/2016   HCT 38.8 04/17/2016   MCV 83.8 04/17/2016   PLT 169 04/17/2016      Chemistry      Component Value Date/Time   NA 142 04/17/2016 1059   NA 141 12/24/2015 1151   K 4.0 04/17/2016 1059   K 4.0 12/24/2015 1151   CL 106 12/24/2015 1151   CO2 28 04/17/2016 1059   CO2 29 12/24/2015 1151   BUN 16.3 04/17/2016 1059   BUN 15 12/24/2015 1151   CREATININE 0.9 04/17/2016 1059   CREATININE 0.90 12/24/2015 1151   CREATININE 1.17* 05/13/2015 1855      Component Value Date/Time   CALCIUM 9.4 04/17/2016 1059   CALCIUM 8.7 12/24/2015 1151   ALKPHOS 69 04/17/2016 1059   ALKPHOS 66 12/24/2015 1151   AST  24 04/17/2016 1059   AST 21 12/24/2015 1151   ALT 47 04/17/2016 1059   ALT 40* 12/24/2015 1151   BILITOT 1.61* 04/17/2016 1059   BILITOT 1.3* 12/24/2015 1151       RADIOGRAPHIC STUDIES: Ct Chest W Contrast  04/17/2016  CLINICAL DATA:  Restaging lung cancer diagnosed 1 year ago. Chemotherapy in progress. No current complaints. EXAM: CT CHEST, ABDOMEN, AND PELVIS WITH CONTRAST TECHNIQUE: Multidetector CT imaging of the chest, abdomen and pelvis was performed following the standard protocol during bolus administration of intravenous contrast. CONTRAST:  163m ISOVUE-300 IOPAMIDOL (ISOVUE-300) INJECTION 61% COMPARISON:  CT 01/12/2016 and 10/12/2015 FINDINGS: CT CHEST Cardiovascular: There are no significant vascular findings. The heart size is normal. A small amount of fluid in the superior pericardial recess is unchanged. Mediastinum/Nodes: There are no enlarged mediastinal, hilar or axillary lymph nodes. The thyroid gland appears unremarkable. There is a small hiatal hernia. Lungs/Pleura: There is no pleural effusion. There has been no significant change in the multifocal irregular nodular and linear densities in both lungs, consistent with treated metastases. The area of volume loss, architectural distortion and bronchiectasis medially in the left lower lobe is unchanged. There is  no endobronchial lesion. No new or enlarging nodules are seen. Musculoskeletal/Chest wall: No chest wall mass or suspicious osseous findings. Stable pectus deformity of the sternum. CT ABDOMEN AND PELVIS FINDINGS Hepatobiliary: There are no worrisome hepatic lesions. The dominant hemangioma in segment 7 is unchanged, measuring 4.0 x 3.0 cm on image 50. No evidence of gallstones, gallbladder wall thickening or biliary dilatation. Pancreas: Unremarkable. No pancreatic ductal dilatation or surrounding inflammatory changes. Spleen: Normal in size without focal abnormality. There is a stable granuloma medially. Adrenals/Urinary  Tract: Both adrenal glands appear normal. The kidneys appear stable without suspicious findings. There are bilateral extrarenal pelves without evidence of urinary tract calculus or obstruction. The bladder appears unremarkable. Stomach/Bowel: No evidence of bowel wall thickening, distention or surrounding inflammatory change. Vascular/Lymphatic: There are no enlarged abdominal or pelvic lymph nodes. Mild aortic atherosclerosis noted. Reproductive: Hysterectomy.  No evidence of adnexal mass. Other: No ascites or peritoneal nodularity. Musculoskeletal: No acute or significant osseous findings. Scattered sclerotic lesions in the pelvis are unchanged. There is stable lower lumbar spondylosis with chronic disc space loss at L5-S1 and a grade 1 anterolisthesis at L4-5. IMPRESSION: 1. Stable examination with treated pulmonary metastatic disease bilaterally. No new or enlarging lesions identified. 2. No evidence of adenopathy or abdominal pelvic metastatic disease. 3. Other findings of potential clinical significance: Stable hepatic hemangioma. Aortic atherosclerosis. Stable lower lumbar spondylosis. Electronically Signed   By: Richardean Sale M.D.   On: 04/17/2016 13:05   Ct Abdomen Pelvis W Contrast  04/17/2016  CLINICAL DATA:  Restaging lung cancer diagnosed 1 year ago. Chemotherapy in progress. No current complaints. EXAM: CT CHEST, ABDOMEN, AND PELVIS WITH CONTRAST TECHNIQUE: Multidetector CT imaging of the chest, abdomen and pelvis was performed following the standard protocol during bolus administration of intravenous contrast. CONTRAST:  169m ISOVUE-300 IOPAMIDOL (ISOVUE-300) INJECTION 61% COMPARISON:  CT 01/12/2016 and 10/12/2015 FINDINGS: CT CHEST Cardiovascular: There are no significant vascular findings. The heart size is normal. A small amount of fluid in the superior pericardial recess is unchanged. Mediastinum/Nodes: There are no enlarged mediastinal, hilar or axillary lymph nodes. The thyroid gland  appears unremarkable. There is a small hiatal hernia. Lungs/Pleura: There is no pleural effusion. There has been no significant change in the multifocal irregular nodular and linear densities in both lungs, consistent with treated metastases. The area of volume loss, architectural distortion and bronchiectasis medially in the left lower lobe is unchanged. There is no endobronchial lesion. No new or enlarging nodules are seen. Musculoskeletal/Chest wall: No chest wall mass or suspicious osseous findings. Stable pectus deformity of the sternum. CT ABDOMEN AND PELVIS FINDINGS Hepatobiliary: There are no worrisome hepatic lesions. The dominant hemangioma in segment 7 is unchanged, measuring 4.0 x 3.0 cm on image 50. No evidence of gallstones, gallbladder wall thickening or biliary dilatation. Pancreas: Unremarkable. No pancreatic ductal dilatation or surrounding inflammatory changes. Spleen: Normal in size without focal abnormality. There is a stable granuloma medially. Adrenals/Urinary Tract: Both adrenal glands appear normal. The kidneys appear stable without suspicious findings. There are bilateral extrarenal pelves without evidence of urinary tract calculus or obstruction. The bladder appears unremarkable. Stomach/Bowel: No evidence of bowel wall thickening, distention or surrounding inflammatory change. Vascular/Lymphatic: There are no enlarged abdominal or pelvic lymph nodes. Mild aortic atherosclerosis noted. Reproductive: Hysterectomy.  No evidence of adnexal mass. Other: No ascites or peritoneal nodularity. Musculoskeletal: No acute or significant osseous findings. Scattered sclerotic lesions in the pelvis are unchanged. There is stable lower lumbar spondylosis with chronic disc space loss  at L5-S1 and a grade 1 anterolisthesis at L4-5. IMPRESSION: 1. Stable examination with treated pulmonary metastatic disease bilaterally. No new or enlarging lesions identified. 2. No evidence of adenopathy or abdominal pelvic  metastatic disease. 3. Other findings of potential clinical significance: Stable hepatic hemangioma. Aortic atherosclerosis. Stable lower lumbar spondylosis. Electronically Signed   By: Richardean Sale M.D.   On: 04/17/2016 13:05    ASSESSMENT AND PLAN: This is a very pleasant 70 years old white female recently diagnosed with a stage IV non-small cell lung cancer, adenocarcinoma with positive EGFR mutation with deletion in exon 19. The patient was started on treatment with Gilotrif 40 mg by mouth daily and tolerating it much better after start taking it at nighttime. She status post 9 months of treatment. This was discontinued today secondary to significant skin rash. She is currently on reduced dose of Gilotrif to 30 mg by mouth daily status post 2 months. The recent CT scan of the chest, abdomen and pelvis showed no evidence for disease progression. I discussed the scan results with the patient and her husband. For the diarrhea, the patient will continue with the imodium. The patient would come back for follow-up visit in one month for reevaluation and repeat blood work. She was advised to call immediately if she has any concerning symptoms in the interval. The patient voices understanding of current disease status and treatment options and is in agreement with the current care plan.  All questions were answered. The patient knows to call the clinic with any problems, questions or concerns. We can certainly see the patient much sooner if necessary.  Disclaimer: This note was dictated with voice recognition software. Similar sounding words can inadvertently be transcribed and may not be corrected upon review.

## 2016-04-26 NOTE — Telephone Encounter (Signed)
Gave pt avs °

## 2016-04-26 NOTE — Progress Notes (Signed)
Pt had ingrown toenail to the left G toe removed on 04/21/16 by Dr. Fritzi Mandes. Pt is taking Keflex 500 mg three times daily x 10 days. Will return 05/05/16 for follow-up.

## 2016-04-27 ENCOUNTER — Ambulatory Visit: Payer: Self-pay | Admitting: Internal Medicine

## 2016-05-09 ENCOUNTER — Ambulatory Visit: Payer: Medicare Other | Admitting: Podiatry

## 2016-05-16 ENCOUNTER — Ambulatory Visit: Payer: Self-pay | Admitting: Internal Medicine

## 2016-05-22 ENCOUNTER — Other Ambulatory Visit: Payer: Self-pay | Admitting: Internal Medicine

## 2016-05-22 MED FILL — *GILOTRIF 30 MG TABLET: 30 | 30 days supply | Qty: 30 | Fill #0

## 2016-05-23 ENCOUNTER — Ambulatory Visit (INDEPENDENT_AMBULATORY_CARE_PROVIDER_SITE_OTHER): Payer: Medicare Other | Admitting: Internal Medicine

## 2016-05-23 ENCOUNTER — Encounter: Payer: Self-pay | Admitting: Internal Medicine

## 2016-05-23 VITALS — BP 120/78 | HR 72 | Temp 97.1°F | Resp 16 | Ht 60.0 in | Wt 144.6 lb

## 2016-05-23 DIAGNOSIS — Z79899 Other long term (current) drug therapy: Secondary | ICD-10-CM

## 2016-05-23 DIAGNOSIS — E782 Mixed hyperlipidemia: Secondary | ICD-10-CM | POA: Diagnosis not present

## 2016-05-23 DIAGNOSIS — E559 Vitamin D deficiency, unspecified: Secondary | ICD-10-CM

## 2016-05-23 DIAGNOSIS — E038 Other specified hypothyroidism: Secondary | ICD-10-CM | POA: Diagnosis not present

## 2016-05-23 DIAGNOSIS — I10 Essential (primary) hypertension: Secondary | ICD-10-CM | POA: Diagnosis not present

## 2016-05-23 DIAGNOSIS — R7303 Prediabetes: Secondary | ICD-10-CM

## 2016-05-23 LAB — LIPID PANEL
CHOL/HDL RATIO: 3.4 ratio (ref <=5.0)
CHOLESTEROL: 189 mg/dL (ref 125–200)
HDL: 55 mg/dL (ref >=46)
LDL Cholesterol: 114 mg/dL (ref <130)
Triglycerides: 100 mg/dL (ref <150)
VLDL: 20 mg/dL (ref <30)

## 2016-05-23 LAB — CBC WITH DIFFERENTIAL/PLATELET
Basophils Absolute: 50 cells/uL (ref 0–200)
Basophils Relative: 1 %
EOS PCT: 6 %
Eosinophils Absolute: 300 cells/uL (ref 15–500)
HCT: 40.9 % (ref 35.0–45.0)
HEMOGLOBIN: 13.5 g/dL (ref 11.7–15.5)
LYMPHS ABS: 1100 {cells}/uL (ref 850–3900)
Lymphocytes Relative: 22 %
MCH: 27.8 pg (ref 27.0–33.0)
MCHC: 33 g/dL (ref 32.0–36.0)
MCV: 84.3 fL (ref 80.0–100.0)
MONOS PCT: 11 %
MPV: 11.9 fL (ref 7.5–12.5)
Monocytes Absolute: 550 cells/uL (ref 200–950)
NEUTROS ABS: 3000 {cells}/uL (ref 1500–7800)
Neutrophils Relative %: 60 %
PLATELETS: 188 10*3/uL (ref 140–400)
RBC: 4.85 MIL/uL (ref 3.80–5.10)
RDW: 14.3 % (ref 11.0–15.0)
WBC: 5 10*3/uL (ref 3.8–10.8)

## 2016-05-23 LAB — HEMOGLOBIN A1C
Hgb A1c MFr Bld: 5.3 % (ref <5.7)
MEAN PLASMA GLUCOSE: 105 mg/dL

## 2016-05-23 LAB — BASIC METABOLIC PANEL WITH GFR
BUN: 15 mg/dL (ref 7–25)
CALCIUM: 9.1 mg/dL (ref 8.6–10.4)
CHLORIDE: 105 mmol/L (ref 98–110)
CO2: 25 mmol/L (ref 20–31)
CREATININE: 0.9 mg/dL (ref 0.50–0.99)
GFR, EST NON AFRICAN AMERICAN: 65 mL/min (ref >=60)
GFR, Est African American: 75 mL/min (ref >=60)
Glucose, Bld: 72 mg/dL (ref 65–99)
Potassium: 3.8 mmol/L (ref 3.5–5.3)
SODIUM: 140 mmol/L (ref 135–146)

## 2016-05-23 LAB — HEPATIC FUNCTION PANEL
ALT: 37 U/L — AB (ref 6–29)
AST: 21 U/L (ref 10–35)
Albumin: 4 g/dL (ref 3.6–5.1)
Alkaline Phosphatase: 72 U/L (ref 33–130)
BILIRUBIN DIRECT: 0.2 mg/dL (ref <=0.2)
BILIRUBIN INDIRECT: 0.9 mg/dL (ref 0.2–1.2)
BILIRUBIN TOTAL: 1.1 mg/dL (ref 0.2–1.2)
Total Protein: 6.5 g/dL (ref 6.1–8.1)

## 2016-05-23 LAB — MAGNESIUM: MAGNESIUM: 1.6 mg/dL (ref 1.5–2.5)

## 2016-05-23 LAB — TSH: TSH: 1.05 mIU/L

## 2016-05-23 NOTE — Patient Instructions (Signed)

## 2016-05-23 NOTE — Progress Notes (Signed)
[facility]                       [name], M.D.        Uvaldo Bristle. Silverio Lay, P.A.-C       Starlyn Skeans, P.A.-C   [facility]                [address] [place] [address] Telephone [phone] Telefax [phone] ______________________________________________________________________     This very nice 70 y.o. MWF presents for 3 month follow up with Hypertension, Hyperlipidemia, Pre-Diabetes and Vitamin D Deficiency. Patient was dx'd with Stage 4 LLL lung cancer in June 2016 and sinc has been on Chemo with Gilotrif supervised by Dr Julien Nordmann.    Patient has been treated for HTN in the past & has been able to d/c her diuretic over the past year & BP has been controlled at home. Today's BP: 120/78. Patient has had no complaints of any cardiac type chest pain, palpitations, dyspnea/orthopnea/PND, dizziness, claudication, or dependent edema.     Hyperlipidemia is controlled with diet & meds. Patient denies myalgias or other med SE's. Last Lipids were  Lab Results  Component Value Date   CHOL 157 12/24/2015   HDL 43 (L) 12/24/2015   LDLCALC 91 12/24/2015   TRIG 114 12/24/2015   CHOLHDL 3.7 12/24/2015      Also, the patient has history of T2_NIDDM PreDiabetes and has had no symptoms of reactive hypoglycemia, diabetic polys, paresthesias or visual blurring.  Last A1c was  Lab Results  Component Value Date   HGBA1C 5.3 09/21/2015       Further, the patient also has history of Vitamin D Deficiency and supplements vitamin D without any suspected side-effects. Last vitamin D was   Lab Results  Component Value Date   VD25OH 30 12/24/2015   Current Outpatient Prescriptions on File Prior to Visit  Medication Sig  . ALPRAZolam (XANAX) 1 MG tablet TAKE 1/2 TO 1 TABLET BY MOUTH THREE TIMES DAILY AS NEEDED FOR ANXIETY  . aspirin 81 MG tablet Take 81 mg by mouth at bedtime.   . Cholecalciferol  (VITAMIN D3) 10000 UNITS TABS Take by mouth.  . clindamycin (CLEOCIN T) 1 % external solution Apply 1 application topically daily as needed.  . Cyanocobalamin (VITAMIN B 12 PO) Take 1 tablet by mouth daily.  Marland Kitchen GILOTRIF 30 MG tablet TAKE 1 TABLET BY MOUTH DAILY. TAKE ON AN EMPTY STOMACH 1HR BEFORE OR 2 HOURS AFTER MEALS.  . HYDROcodone-homatropine (HYCODAN) 5-1.5 MG/5ML syrup Take 5 mLs by mouth every 6 (six) hours as needed for cough.  . hydrocortisone cream 0.5 % Apply 1 application topically 2 (two) times daily.  Marland Kitchen levothyroxine (SYNTHROID, LEVOTHROID) 50 MCG tablet TAKE 1 TABLET BY MOUTH EVERY DAY  . magnesium 30 MG tablet Take 30 mg by mouth 2 (two) times daily.  . montelukast (SINGULAIR) 10 MG tablet TAKE 1 TABLET BY MOUTH EVERY DAY  . Triprolidine-Pseudoephedrine (ANTIHISTAMINE PO) Take by mouth. Reported on 04/26/2016   No current facility-administered medications on file prior to visit.    Allergies  Allergen Reactions  . Penicillins Swelling and Rash   PMHx:   Past Medical History:  Diagnosis Date  . Adenocarcinoma of left lung, stage 4 ([place]) 04/05/2015   Biopsy confirmed CT SCAN: There are innumerable bilateral pulmonary nodules. These range in  size from about 5 mm to 2 cm. They demonstrate irregular indistinct borders, the larger ones demonstrating spiculation. PET SCAN: Diffuse pulmonary metastatic disease with a 4 cm dominant left lower low lung lesion. No enlarged or hypermetabolic mediastinal or hilar adenopathy.  . Allergy   . Anxiety   . Cancer ([facility])   . Drug-induced skin rash 01/17/2016  . Hyperlipidemia   . Hypertension   . Insomnia   . Prediabetes   . Thyroid disease    Immunization History  Administered Date(s) Administered  . Influenza, High Dose Seasonal PF 07/31/2014  . Influenza-Unspecified 09/13/2015  . Pneumococcal Conjugate-13 12/24/2015  . Pneumococcal Polysaccharide-23 07/22/2012  . Td 12/25/2005  . Zoster 01/09/2007   Past Surgical History:   Procedure Laterality Date  . ABDOMINAL HYSTERECTOMY  1995   w BSO  . TONSILLECTOMY AND ADENOIDECTOMY     FHx:    Reviewed / unchanged  SHx:    Reviewed / unchanged  Systems Review:  Constitutional: Denies fever, chills, wt changes, headaches, insomnia, fatigue, night sweats, change in appetite. Eyes: Denies redness, blurred vision, diplopia, discharge, itchy, watery eyes.  ENT: Denies discharge, congestion, post nasal drip, epistaxis, sore throat, earache, hearing loss, dental pain, tinnitus, vertigo, sinus pain, snoring.  CV: Denies chest pain, palpitations, irregular heartbeat, syncope, dyspnea, diaphoresis, orthopnea, PND, claudication or edema. Respiratory: denies cough, dyspnea, DOE, pleurisy, hoarseness, laryngitis, wheezing.  Gastrointestinal: Denies dysphagia, odynophagia, heartburn, reflux, water brash, abdominal pain or cramps, nausea, vomiting, bloating, diarrhea, constipation, hematemesis, melena, hematochezia  or hemorrhoids. Genitourinary: Denies dysuria, frequency, urgency, nocturia, hesitancy, discharge, hematuria or flank pain. Musculoskeletal: Denies arthralgias, myalgias, stiffness, jt. swelling, pain, limping or strain/sprain.  Skin: Denies pruritus, rash, hives, warts, acne, eczema or change in skin lesion(s). Neuro: No weakness, tremor, incoordination, spasms, paresthesia or pain. Psychiatric: Denies confusion, memory loss or sensory loss. Endo: Denies change in weight, skin or hair change.  Heme/Lymph: No excessive bleeding, bruising or enlarged lymph nodes.  Physical Exam  BP 120/78   Pulse 72   Temp 97.1 F (36.2 C)   Resp 16   Ht 5' (1.524 m)   Wt 144 lb 9.6 oz (65.6 kg)   BMI 28.24 kg/m   Appears well nourished and in no distress. Eyes: PERRLA, EOMs, conjunctiva no swelling or erythema. Sinuses: No frontal/maxillary tenderness ENT/Mouth: EAC's clear, TM's nl w/o erythema, bulging. Nares clear w/o erythema, swelling, exudates. Oropharynx clear  without erythema or exudates. Oral hygiene is good. Tongue normal, non obstructing. Hearing intact.  Neck: Supple. Thyroid nl. Car 2+/2+ without bruits, nodes or JVD. Chest: Respirations nl with BS clear & equal w/o rales, rhonchi, wheezing or stridor.  Cor: Heart sounds normal w/ regular rate and rhythm without sig. murmurs, gallops, clicks, or rubs. Peripheral pulses normal and equal  without edema.  Abdomen: Soft & bowel sounds normal. Non-tender w/o guarding, rebound, hernias, masses, or organomegaly.  Lymphatics: Unremarkable.  Musculoskeletal: Full ROM all peripheral extremities, joint stability, 5/5 strength, and normal gait.  Skin: Warm, dry without exposed rashes, lesions or ecchymosis apparent.  Neuro: Cranial nerves intact, reflexes equal bilaterally. Sensory-motor testing grossly intact. Tendon reflexes grossly intact.  Pysch: Alert & oriented x 3.  Insight and judgement nl & appropriate. No ideations.  Assessment and Plan:   1. Essential hypertension  - Continue medication, monitor blood pressure at home. Continue DASH diet. Reminder to go to the ER if any CP, SOB, nausea, dizziness, severe HA, changes vision/speech, left arm numbness and tingling and jaw pain. -  TSH  2. Hyperlipidemia  - Continue diet/meds, exercise,& lifestyle modifications. Continue monitor periodic cholesterol/liver & renal functions  - Lipid panel - TSH  3. Prediabetes  - Continue diet, exercise, lifestyle modifications. Monitor appropriate labs. - Hemoglobin A1c - Insulin, random  4. Vitamin D deficiency  - Continue supplementation. - VITAMIN D 25 Hydroxy   5. Hypothyroidism  - TSH  6. Medication management  - CBC with Differential/Platelet - BASIC METABOLIC PANEL WITH GFR - Hepatic function panel - Magnesium  7. LLL lung Cancer, stage IV   Recommended regular exercise, BP monitoring, weight control, and discussed med and SE's. Recommended labs to assess and monitor clinical  status. Further disposition pending results of labs. Over 30 minutes of exam, counseling, chart review was performed

## 2016-05-24 LAB — INSULIN, RANDOM: Insulin: 25.3 u[IU]/mL — ABNORMAL HIGH (ref 2.0–19.6)

## 2016-05-24 LAB — VITAMIN D 25 HYDROXY (VIT D DEFICIENCY, FRACTURES): VIT D 25 HYDROXY: 118 ng/mL — AB (ref 30–100)

## 2016-05-25 ENCOUNTER — Telehealth: Payer: Self-pay | Admitting: Internal Medicine

## 2016-05-25 ENCOUNTER — Encounter: Payer: Self-pay | Admitting: Internal Medicine

## 2016-05-25 ENCOUNTER — Other Ambulatory Visit (HOSPITAL_BASED_OUTPATIENT_CLINIC_OR_DEPARTMENT_OTHER): Payer: Medicare Other

## 2016-05-25 ENCOUNTER — Ambulatory Visit (HOSPITAL_BASED_OUTPATIENT_CLINIC_OR_DEPARTMENT_OTHER): Payer: Medicare Other | Admitting: Internal Medicine

## 2016-05-25 VITALS — BP 129/76 | HR 80 | Temp 98.1°F | Resp 18 | Ht 60.0 in | Wt 143.5 lb

## 2016-05-25 DIAGNOSIS — C7800 Secondary malignant neoplasm of unspecified lung: Secondary | ICD-10-CM

## 2016-05-25 DIAGNOSIS — C3432 Malignant neoplasm of lower lobe, left bronchus or lung: Secondary | ICD-10-CM

## 2016-05-25 DIAGNOSIS — Z5111 Encounter for antineoplastic chemotherapy: Secondary | ICD-10-CM

## 2016-05-25 DIAGNOSIS — L270 Generalized skin eruption due to drugs and medicaments taken internally: Secondary | ICD-10-CM

## 2016-05-25 DIAGNOSIS — R197 Diarrhea, unspecified: Secondary | ICD-10-CM

## 2016-05-25 DIAGNOSIS — C3492 Malignant neoplasm of unspecified part of left bronchus or lung: Secondary | ICD-10-CM

## 2016-05-25 LAB — COMPREHENSIVE METABOLIC PANEL
ALBUMIN: 3.5 g/dL (ref 3.5–5.0)
ALK PHOS: 79 U/L (ref 40–150)
ALT: 40 U/L (ref 0–55)
AST: 22 U/L (ref 5–34)
Anion Gap: 8 mEq/L (ref 3–11)
BUN: 17.4 mg/dL (ref 7.0–26.0)
CALCIUM: 9.4 mg/dL (ref 8.4–10.4)
CHLORIDE: 106 meq/L (ref 98–109)
CO2: 28 mEq/L (ref 22–29)
CREATININE: 0.9 mg/dL (ref 0.6–1.1)
EGFR: 65 mL/min/{1.73_m2} — ABNORMAL LOW (ref >90)
GLUCOSE: 82 mg/dL (ref 70–140)
POTASSIUM: 4.6 meq/L (ref 3.5–5.1)
SODIUM: 142 meq/L (ref 136–145)
Total Bilirubin: 1.34 mg/dL — ABNORMAL HIGH (ref 0.20–1.20)
Total Protein: 6.8 g/dL (ref 6.4–8.3)

## 2016-05-25 LAB — CBC WITH DIFFERENTIAL/PLATELET
BASO%: 0.6 % (ref 0.0–2.0)
Basophils Absolute: 0 10*3/uL (ref 0.0–0.1)
EOS%: 5 % (ref 0.0–7.0)
Eosinophils Absolute: 0.3 10*3/uL (ref 0.0–0.5)
HEMATOCRIT: 38.4 % (ref 34.8–46.6)
HEMOGLOBIN: 12.9 g/dL (ref 11.6–15.9)
LYMPH#: 1.2 10*3/uL (ref 0.9–3.3)
LYMPH%: 23.4 % (ref 14.0–49.7)
MCH: 28 pg (ref 25.1–34.0)
MCHC: 33.6 g/dL (ref 31.5–36.0)
MCV: 83.5 fL (ref 79.5–101.0)
MONO#: 0.6 10*3/uL (ref 0.1–0.9)
MONO%: 12 % (ref 0.0–14.0)
NEUT#: 3.1 10*3/uL (ref 1.5–6.5)
NEUT%: 59 % (ref 38.4–76.8)
Platelets: 168 10*3/uL (ref 145–400)
RBC: 4.6 10*6/uL (ref 3.70–5.45)
RDW: 13.5 % (ref 11.2–14.5)
WBC: 5.2 10*3/uL (ref 3.9–10.3)

## 2016-05-25 NOTE — Progress Notes (Signed)
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  PRIOR THERAPY: Gilotrif 40 mg by mouth daily. First dose started on 05/11/2015. Status post 9 months of treatment. This was discontinued on 02/17/2016 secondary to extensive skin rash.   CURRENT THERAPY: Gilotrif 30 mg by mouth daily. First dose start on 02/24/2016. Status post 3 months of treatment.  INTERVAL HISTORY: Elizabeth [name] 70 y.o. female returns to the clinic today for follow-up visit. The patient is feeling much better today with no specific complaints. She has very mild skin rash but no significant diarrhea. She denied having any significant chest pain, shortness of breath, cough or hemoptysis. She has no significant weight loss or night sweats. She denied having any significant nausea or vomiting, no fever or chills. She is here today for reevaluation with repeat blood work.  MEDICAL HISTORY: Past Medical History:  Diagnosis Date  . Adenocarcinoma of left lung, stage 4 ([place]) 04/05/2015   Biopsy confirmed CT SCAN: There are innumerable bilateral pulmonary nodules. These range in size from about 5 mm to 2 cm. They demonstrate irregular indistinct borders, the larger ones demonstrating spiculation. PET SCAN: Diffuse pulmonary metastatic disease with a 4 cm dominant left lower low lung lesion. No enlarged or hypermetabolic mediastinal or hilar adenopathy.  . Allergy   . Anxiety   . Cancer ([place])   . Drug-induced skin rash 01/17/2016  . Hyperlipidemia   . Hypertension   . Insomnia   . Prediabetes   . Thyroid disease      ALLERGIES:  is allergic to penicillins.  MEDICATIONS:  Current Outpatient Prescriptions  Medication Sig Dispense Refill  . ALPRAZolam (XANAX) 1 MG tablet TAKE 1/2 TO 1 TABLET BY MOUTH THREE TIMES DAILY AS NEEDED FOR ANXIETY 270 tablet 1  . aspirin 81 MG tablet Take 81 mg by mouth at bedtime.     . Cholecalciferol (VITAMIN D3) 10000 UNITS TABS Take by mouth.    . clindamycin (CLEOCIN T) 1 % external solution Apply 1 application topically daily as needed. 30 mL 1  . Cyanocobalamin (VITAMIN B 12 PO) Take 1 tablet by mouth daily.    Marland Kitchen GILOTRIF 30 MG tablet TAKE 1 TABLET BY MOUTH DAILY. TAKE ON AN EMPTY STOMACH 1HR BEFORE OR 2 HOURS AFTER MEALS. 30 tablet 2  . HYDROcodone-homatropine (HYCODAN) 5-1.5 MG/5ML syrup Take 5 mLs by mouth every 6 (six) hours as needed for cough. 120 mL 0  . hydrocortisone cream 0.5 % Apply 1 application topically 2 (two) times daily.    Marland Kitchen levothyroxine (SYNTHROID, LEVOTHROID) 50 MCG tablet TAKE 1 TABLET BY MOUTH EVERY DAY 90 tablet 0  . magnesium 30 MG tablet Take 30 mg by mouth 2 (two) times daily.    . montelukast (SINGULAIR) 10 MG tablet TAKE 1 TABLET BY MOUTH EVERY DAY 90 tablet 1  . Triprolidine-Pseudoephedrine (ANTIHISTAMINE PO) Take by mouth. Reported on 04/26/2016     No current facility-administered medications for this visit.     SURGICAL HISTORY:  Past Surgical History:  Procedure Laterality Date  . ABDOMINAL HYSTERECTOMY  1995   w [facility]  REVIEW OF SYSTEMS:  A comprehensive review of systems was negative.   PHYSICAL EXAMINATION: General appearance: alert, cooperative and no distress Head: Normocephalic, without obvious abnormality, atraumatic Neck: no adenopathy, no JVD, supple, symmetrical, trachea midline and thyroid not enlarged, symmetric, no tenderness/mass/nodules Lymph nodes: Cervical, supraclavicular, and axillary nodes normal. Resp: clear to auscultation bilaterally Back: symmetric, no curvature.  ROM normal. No CVA tenderness. Cardio: regular rate and rhythm, S1, S2 normal, no murmur, click, rub or gallop GI: soft, non-tender; bowel sounds normal; no masses,  no organomegaly Extremities: extremities normal, atraumatic, no cyanosis or edema Neurologic: Alert and oriented X 3, normal strength and tone. Normal symmetric reflexes. Normal coordination and gait   ECOG PERFORMANCE STATUS: 0 - Asymptomatic  Blood pressure 129/76, pulse 80, temperature 98.1 F (36.7 C), temperature source Oral, resp. rate 18, height 5' (1.524 m), weight 143 lb 8 oz (65.1 kg), SpO2 100 %.  LABORATORY DATA: Lab Results  Component Value Date   WBC 5.2 05/25/2016   HGB 12.9 05/25/2016   HCT 38.4 05/25/2016   MCV 83.5 05/25/2016   PLT 168 05/25/2016      Chemistry      Component Value Date/Time   NA 140 05/23/2016 1234   NA 142 04/17/2016 1059   K 3.8 05/23/2016 1234   K 4.0 04/17/2016 1059   CL 105 05/23/2016 1234   CO2 25 05/23/2016 1234   CO2 28 04/17/2016 1059   BUN 15 05/23/2016 1234   BUN 16.3 04/17/2016 1059   CREATININE 0.90 05/23/2016 1234   CREATININE 0.9 04/17/2016 1059      Component Value Date/Time   CALCIUM 9.1 05/23/2016 1234   CALCIUM 9.4 04/17/2016 1059   ALKPHOS 72 05/23/2016 1234   ALKPHOS 69 04/17/2016 1059   AST 21 05/23/2016 1234   AST 24 04/17/2016 1059   ALT 37 (H) 05/23/2016 1234   ALT 47 04/17/2016 1059   BILITOT 1.1 05/23/2016 1234   BILITOT 1.61 (H) 04/17/2016 1059       RADIOGRAPHIC STUDIES: No results found.  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 The patient was started on treatment with Gilotrif 40 mg by mouth daily, status post 9 months of treatment. This was discontinued today secondary to significant skin rash. She is currently on reduced dose of Gilotrif to 30 mg by mouth daily status  post 3 months. She is tolerating this dose much better. I recommended for the patient to continue her treatment as scheduled. For the diarrhea, the patient will continue with the imodium on as-needed basis. The patient would come back for follow-up visit in one month for reevaluation and repeat blood work. She was advised to call immediately if she has any concerning symptoms in the interval. The patient voices understanding of current disease status and treatment options and is in agreement with the current care plan.  All questions were answered. The patient knows to call the clinic with any problems, questions or concerns. We can certainly see the patient much sooner if necessary.  Disclaimer: This note was dictated with voice recognition software. Similar sounding words can inadvertently be transcribed and may not be corrected upon review.

## 2016-05-25 NOTE — Telephone Encounter (Signed)
Gv pt appt for 06/29/16.

## 2016-05-29 ENCOUNTER — Encounter: Payer: Self-pay | Admitting: Physician Assistant

## 2016-05-29 DIAGNOSIS — I700 Atherosclerosis of aorta: Secondary | ICD-10-CM | POA: Insufficient documentation

## 2016-06-12 ENCOUNTER — Telehealth: Payer: Self-pay | Admitting: Medical Oncology

## 2016-06-12 ENCOUNTER — Other Ambulatory Visit: Payer: Self-pay | Admitting: Medical Oncology

## 2016-06-12 DIAGNOSIS — R21 Rash and other nonspecific skin eruption: Secondary | ICD-10-CM

## 2016-06-12 MED ORDER — METHYLPREDNISOLONE 4 MG PO TBPK: ORAL_TABLET | ORAL | 0 refills | Status: DC

## 2016-06-12 NOTE — Telephone Encounter (Signed)
Returned call . She stated she is "breaking out again like she did in march and april on her torso-stomach-  and upper thighs including inner thighs.

## 2016-06-12 NOTE — Telephone Encounter (Signed)
Prednisone dose pak called to pharmacy,I left message for pt.

## 2016-06-20 MED FILL — *GILOTRIF 30 MG TABLET: 30 | 30 days supply | Qty: 30 | Fill #1

## 2016-06-24 IMAGING — CR DG CHEST 1V
1 series · 1 of 1 positions shown · non-contrast
Comparison: 07/31/2013

CLINICAL DATA: Post biopsy

EXAM:
CHEST  1 VIEW

[w chest pa]
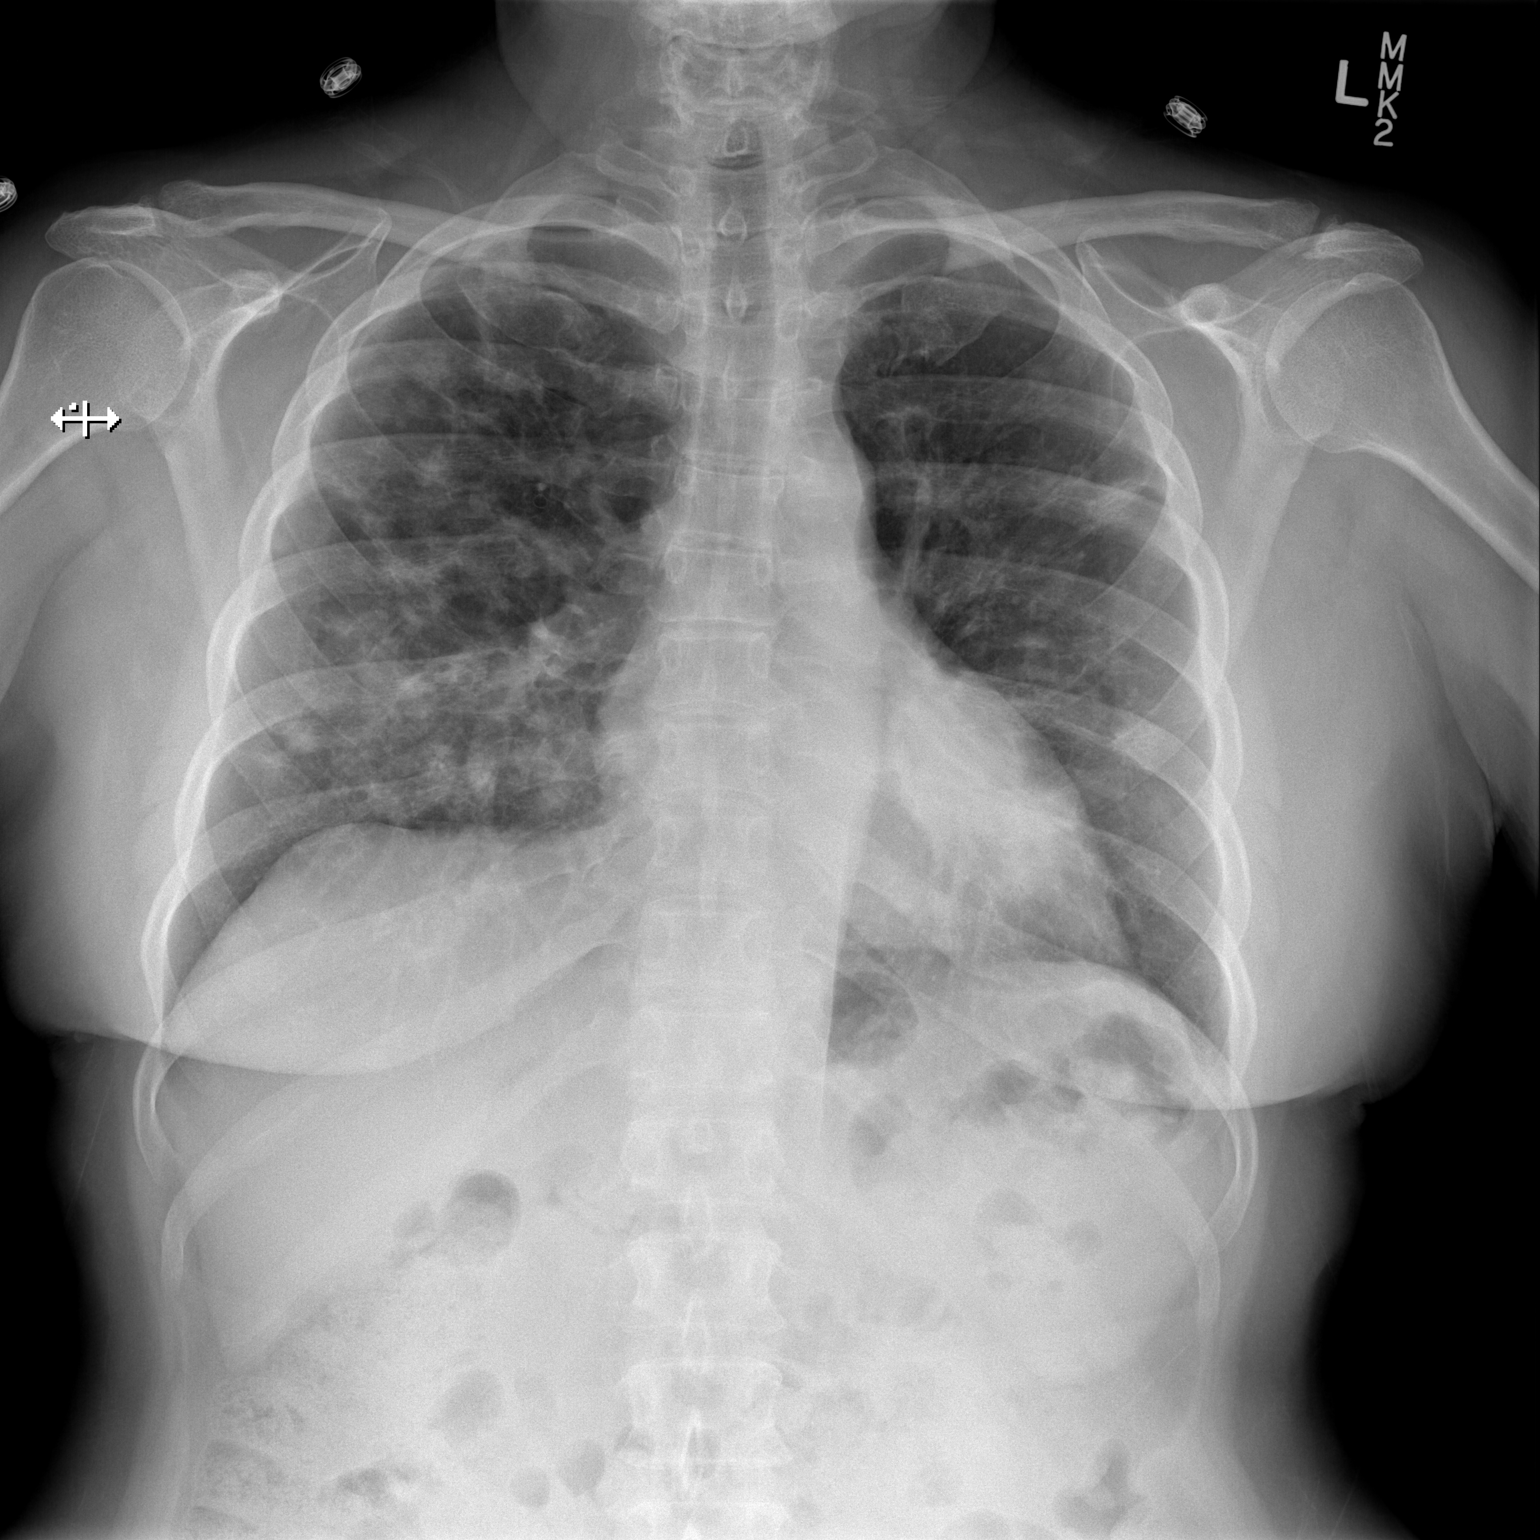

[1 of 1 positions shown; findings below may reference images not displayed]

FINDINGS: Numerous ill-defined pulmonary nodules bilaterally, with a dominant
nodule in the retrocardiac lung which has been recently biopsied. No
evidence of significant surrounding alveolar hemorrhage. No
pneumothorax or hemothorax noted. Normal heart size and mediastinal
contours.
IMPRESSION: No pneumothorax or other acute finding after left lung biopsy.

## 2016-06-29 ENCOUNTER — Ambulatory Visit (HOSPITAL_BASED_OUTPATIENT_CLINIC_OR_DEPARTMENT_OTHER): Payer: Medicare Other | Admitting: Internal Medicine

## 2016-06-29 ENCOUNTER — Other Ambulatory Visit (HOSPITAL_BASED_OUTPATIENT_CLINIC_OR_DEPARTMENT_OTHER): Payer: Medicare Other

## 2016-06-29 ENCOUNTER — Encounter: Payer: Self-pay | Admitting: Internal Medicine

## 2016-06-29 ENCOUNTER — Telehealth: Payer: Self-pay | Admitting: Internal Medicine

## 2016-06-29 VITALS — BP 130/82 | HR 71 | Temp 98.0°F | Resp 17 | Ht 60.0 in | Wt 145.9 lb

## 2016-06-29 DIAGNOSIS — C3432 Malignant neoplasm of lower lobe, left bronchus or lung: Secondary | ICD-10-CM

## 2016-06-29 DIAGNOSIS — R197 Diarrhea, unspecified: Secondary | ICD-10-CM

## 2016-06-29 DIAGNOSIS — C7800 Secondary malignant neoplasm of unspecified lung: Secondary | ICD-10-CM

## 2016-06-29 DIAGNOSIS — C3492 Malignant neoplasm of unspecified part of left bronchus or lung: Secondary | ICD-10-CM

## 2016-06-29 DIAGNOSIS — L270 Generalized skin eruption due to drugs and medicaments taken internally: Secondary | ICD-10-CM | POA: Diagnosis not present

## 2016-06-29 DIAGNOSIS — Z5111 Encounter for antineoplastic chemotherapy: Secondary | ICD-10-CM

## 2016-06-29 LAB — COMPREHENSIVE METABOLIC PANEL
ALT: 34 U/L (ref 0–55)
ANION GAP: 8 meq/L (ref 3–11)
AST: 22 U/L (ref 5–34)
Albumin: 3.1 g/dL — ABNORMAL LOW (ref 3.5–5.0)
Alkaline Phosphatase: 91 U/L (ref 40–150)
BILIRUBIN TOTAL: 0.95 mg/dL (ref 0.20–1.20)
BUN: 14.9 mg/dL (ref 7.0–26.0)
CALCIUM: 9.2 mg/dL (ref 8.4–10.4)
CO2: 29 meq/L (ref 22–29)
CREATININE: 0.9 mg/dL (ref 0.6–1.1)
Chloride: 105 mEq/L (ref 98–109)
EGFR: 65 mL/min/{1.73_m2} — ABNORMAL LOW (ref >90)
Glucose: 88 mg/dl (ref 70–140)
Potassium: 4.5 mEq/L (ref 3.5–5.1)
Sodium: 142 mEq/L (ref 136–145)
TOTAL PROTEIN: 6.6 g/dL (ref 6.4–8.3)

## 2016-06-29 LAB — CBC WITH DIFFERENTIAL/PLATELET
BASO%: 0.8 % (ref 0.0–2.0)
Basophils Absolute: 0.1 10*3/uL (ref 0.0–0.1)
EOS%: 3.9 % (ref 0.0–7.0)
Eosinophils Absolute: 0.3 10*3/uL (ref 0.0–0.5)
HEMATOCRIT: 38.4 % (ref 34.8–46.6)
HGB: 12.5 g/dL (ref 11.6–15.9)
LYMPH#: 1 10*3/uL (ref 0.9–3.3)
LYMPH%: 13.7 % — ABNORMAL LOW (ref 14.0–49.7)
MCH: 27.2 pg (ref 25.1–34.0)
MCHC: 32.6 g/dL (ref 31.5–36.0)
MCV: 83.4 fL (ref 79.5–101.0)
MONO#: 0.6 10*3/uL (ref 0.1–0.9)
MONO%: 8.7 % (ref 0.0–14.0)
NEUT%: 72.9 % (ref 38.4–76.8)
NEUTROS ABS: 5.2 10*3/uL (ref 1.5–6.5)
PLATELETS: 186 10*3/uL (ref 145–400)
RBC: 4.61 10*6/uL (ref 3.70–5.45)
RDW: 13.7 % (ref 11.2–14.5)
WBC: 7.1 10*3/uL (ref 3.9–10.3)

## 2016-06-29 MED ORDER — DOXYCYCLINE HYCLATE 100 MG PO TABS: 100.0000 mg | ORAL_TABLET | Freq: Two times a day (BID) | ORAL | 0 refills | Status: DC

## 2016-06-29 MED FILL — DOXYCYCLINE HYCLATE 100 MG: 100 | 10 days supply | Qty: 20 | Fill #0

## 2016-06-29 NOTE — Progress Notes (Signed)
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  PRIOR THERAPY: Gilotrif 40 mg by mouth daily. First dose started on 05/11/2015. Status post 9 months of treatment. This was discontinued on 02/17/2016 secondary to extensive skin rash.   CURRENT THERAPY: Gilotrif 30 mg by mouth daily. First dose start on 02/24/2016. Status post 4 months of treatment.  INTERVAL HISTORY: Elizabeth [name] 70 y.o. female returns to the clinic today for follow-up visit. The patient is feeling much better today with no specific complaints except for several papular skin rash on the lower extremities. She was treated with a Medrol Dosepak and felt a little bit better but the rash recurs. She has intermittent diarrhea. She denied having any significant chest pain, shortness of breath, cough or hemoptysis. She has no significant weight loss or night sweats. She denied having any significant nausea or vomiting, no fever or chills. She is here today for reevaluation with repeat blood work.  MEDICAL HISTORY: Past Medical History:  Diagnosis Date  . Adenocarcinoma of left lung, stage 4 ([place]) 04/05/2015   Biopsy confirmed CT SCAN: There are innumerable bilateral pulmonary nodules. These range in size from about 5 mm to 2 cm. They demonstrate irregular indistinct borders, the larger ones demonstrating spiculation. PET SCAN: Diffuse pulmonary metastatic disease with a 4 cm dominant left lower low lung lesion. No enlarged or hypermetabolic mediastinal or hilar adenopathy.  . Allergy   . Anxiety   . Cancer ([facility])   . Drug-induced  skin rash 01/17/2016  . Hyperlipidemia   . Hypertension   . Insomnia   . Prediabetes   . Thyroid disease     ALLERGIES:  is allergic to penicillins.  MEDICATIONS:  Current Outpatient Prescriptions  Medication Sig Dispense Refill  . ALPRAZolam (XANAX) 1 MG tablet TAKE 1/2 TO 1 TABLET BY MOUTH THREE TIMES DAILY AS NEEDED FOR ANXIETY 270 tablet 1  . aspirin 81 MG tablet Take 81 mg by mouth at bedtime.     . Cholecalciferol (VITAMIN D3) 10000 UNITS TABS Take by mouth.    . clindamycin (CLEOCIN T) 1 % external solution Apply 1 application topically daily as needed. (Patient not taking: Reported on 05/25/2016) 30 mL 1  . Cyanocobalamin (VITAMIN B 12 PO) Take 1 tablet by mouth daily.    Marland Kitchen GILOTRIF 30 MG tablet TAKE 1 TABLET BY MOUTH DAILY. TAKE ON AN EMPTY STOMACH 1HR BEFORE OR 2 HOURS AFTER MEALS. 30 tablet 2  . HYDROcodone-homatropine (HYCODAN) 5-1.5 MG/5ML syrup Take 5 mLs by mouth every 6 (six) hours as needed for cough. (Patient not taking: Reported on 05/25/2016) 120 mL 0  . hydrocortisone cream 0.5 % Apply 1 application topically 2 (two) times daily.    Marland Kitchen levothyroxine (SYNTHROID, LEVOTHROID) 50 MCG tablet TAKE 1 TABLET BY MOUTH EVERY DAY 90 tablet 0  . magnesium 30 MG tablet Take 30 mg by mouth 2 (two) times daily.    . methylPREDNISolone (MEDROL DOSEPAK) 4 MG TBPK tablet Take per package instructions 21 tablet 0  . montelukast (SINGULAIR) 10 MG tablet TAKE 1 TABLET BY MOUTH EVERY DAY 90 tablet 1  .  Triprolidine-Pseudoephedrine (ANTIHISTAMINE PO) Take by mouth. Reported on 04/26/2016     No current facility-administered medications for this visit.     SURGICAL HISTORY:  Past Surgical History:  Procedure Laterality Date  . ABDOMINAL HYSTERECTOMY  1995   w [facility]  . TONSILLECTOMY AND ADENOIDECTOMY      REVIEW OF SYSTEMS:  A comprehensive review of systems was negative except for: Gastrointestinal: positive for diarrhea Integument/breast: positive for rash   PHYSICAL EXAMINATION: General  appearance: alert, cooperative and no distress Head: Normocephalic, without obvious abnormality, atraumatic Neck: no adenopathy, no JVD, supple, symmetrical, trachea midline and thyroid not enlarged, symmetric, no tenderness/mass/nodules Lymph nodes: Cervical, supraclavicular, and axillary nodes normal. Resp: clear to auscultation bilaterally Back: symmetric, no curvature. ROM normal. No CVA tenderness. Cardio: regular rate and rhythm, S1, S2 normal, no murmur, click, rub or gallop GI: soft, non-tender; bowel sounds normal; no masses,  no organomegaly Extremities: extremities normal, atraumatic, no cyanosis or edema Neurologic: Alert and oriented X 3, normal strength and tone. Normal symmetric reflexes. Normal coordination and gait   ECOG PERFORMANCE STATUS: 0 - Asymptomatic  Blood pressure 130/82, pulse 71, temperature 98 F (36.7 C), temperature source Oral, resp. rate 17, height 5' (1.524 m), weight 145 lb 14.4 oz (66.2 kg), SpO2 100 %.  LABORATORY DATA: Lab Results  Component Value Date   WBC 7.1 06/29/2016   HGB 12.5 06/29/2016   HCT 38.4 06/29/2016   MCV 83.4 06/29/2016   PLT 186 06/29/2016      Chemistry      Component Value Date/Time   NA 142 06/29/2016 1054   K 4.5 06/29/2016 1054   CL 105 05/23/2016 1234   CO2 29 06/29/2016 1054   BUN 14.9 06/29/2016 1054   CREATININE 0.9 06/29/2016 1054      Component Value Date/Time   CALCIUM 9.2 06/29/2016 1054   ALKPHOS 91 06/29/2016 1054   AST 22 06/29/2016 1054   ALT 34 06/29/2016 1054   BILITOT 0.95 06/29/2016 1054       RADIOGRAPHIC STUDIES: No results found.  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 The patient was started on treatment with Gilotrif 40 mg by mouth daily, status post 9 months of treatment. This was discontinued today secondary to  significant skin rash. She is currently on reduced dose of Gilotrif to 30 mg by mouth daily status post 4 months. She is tolerating the treatment well except for recurrent skin rash mainly on the lower extremities as well as intermittent diarrhea. I recommended for the patient to continue her treatment with Gilotrif as scheduled.  For the skin rash, I will start the patient on doxycycline 100 mg by mouth twice a day for 10 days. For the diarrhea, the patient will continue with the imodium on as-needed basis. The patient would come back for follow-up visit in one month for reevaluation and repeat blood work as well as CT scan of the chest, abdomen and pelvis for restaging of her disease. She was advised to call immediately if she has any concerning symptoms in the interval. The patient voices understanding of current disease status and treatment options and is in agreement with the current care plan.  All questions were answered. The patient knows to call the clinic with any problems, questions or concerns. We can certainly see the patient much sooner if necessary.  Disclaimer: This note was dictated with voice recognition software. Similar sounding words can inadvertently be transcribed and may not be corrected upon review.

## 2016-06-29 NOTE — Telephone Encounter (Signed)
Gave patient avs report and appointments for October. Central radiology will call re ct - patient aware.

## 2016-07-03 ENCOUNTER — Other Ambulatory Visit: Payer: Self-pay | Admitting: Internal Medicine

## 2016-07-04 MED ORDER — LEVOTHYROXINE SODIUM 50 MCG PO TABS: 50.0000 ug | ORAL_TABLET | Freq: Every day | ORAL | 0 refills | Status: DC

## 2016-07-18 ENCOUNTER — Encounter: Payer: Self-pay | Admitting: Internal Medicine

## 2016-07-20 MED FILL — *GILOTRIF 30 MG TABLET: 30 | 30 days supply | Qty: 30 | Fill #2

## 2016-07-28 ENCOUNTER — Encounter (HOSPITAL_COMMUNITY): Payer: Self-pay

## 2016-07-28 ENCOUNTER — Other Ambulatory Visit (HOSPITAL_BASED_OUTPATIENT_CLINIC_OR_DEPARTMENT_OTHER): Payer: Medicare Other

## 2016-07-28 ENCOUNTER — Ambulatory Visit (HOSPITAL_COMMUNITY)
Admission: RE | Admit: 2016-07-28 | Discharge: 2016-07-28 | Disposition: A | Discharge status: Home / Self Care | Payer: Medicare Other | Source: Ambulatory Visit | Attending: Internal Medicine | Admitting: Internal Medicine

## 2016-07-28 DIAGNOSIS — C3492 Malignant neoplasm of unspecified part of left bronchus or lung: Secondary | ICD-10-CM

## 2016-07-28 DIAGNOSIS — Z5111 Encounter for antineoplastic chemotherapy: Secondary | ICD-10-CM

## 2016-07-28 DIAGNOSIS — D1803 Hemangioma of intra-abdominal structures: Secondary | ICD-10-CM | POA: Insufficient documentation

## 2016-07-28 LAB — CBC WITH DIFFERENTIAL/PLATELET
BASO%: 0.5 % (ref 0.0–2.0)
Basophils Absolute: 0 10*3/uL (ref 0.0–0.1)
EOS ABS: 0.2 10*3/uL (ref 0.0–0.5)
EOS%: 2.3 % (ref 0.0–7.0)
HCT: 38.6 % (ref 34.8–46.6)
HEMOGLOBIN: 12.6 g/dL (ref 11.6–15.9)
LYMPH%: 14.9 % (ref 14.0–49.7)
MCH: 26.9 pg (ref 25.1–34.0)
MCHC: 32.6 g/dL (ref 31.5–36.0)
MCV: 82.8 fL (ref 79.5–101.0)
MONO#: 0.8 10*3/uL (ref 0.1–0.9)
MONO%: 9.9 % (ref 0.0–14.0)
NEUT%: 72.4 % (ref 38.4–76.8)
NEUTROS ABS: 5.7 10*3/uL (ref 1.5–6.5)
PLATELETS: 183 10*3/uL (ref 145–400)
RBC: 4.66 10*6/uL (ref 3.70–5.45)
RDW: 13.8 % (ref 11.2–14.5)
WBC: 7.8 10*3/uL (ref 3.9–10.3)
lymph#: 1.2 10*3/uL (ref 0.9–3.3)

## 2016-07-28 LAB — COMPREHENSIVE METABOLIC PANEL
ALBUMIN: 3.2 g/dL — AB (ref 3.5–5.0)
ALK PHOS: 80 U/L (ref 40–150)
ALT: 22 U/L (ref 0–55)
ANION GAP: 9 meq/L (ref 3–11)
AST: 17 U/L (ref 5–34)
BILIRUBIN TOTAL: 1.17 mg/dL (ref 0.20–1.20)
BUN: 18.2 mg/dL (ref 7.0–26.0)
CALCIUM: 9.2 mg/dL (ref 8.4–10.4)
CO2: 27 mEq/L (ref 22–29)
Chloride: 107 mEq/L (ref 98–109)
Creatinine: 0.8 mg/dL (ref 0.6–1.1)
EGFR: 72 mL/min/{1.73_m2} — AB (ref >90)
Glucose: 69 mg/dl — ABNORMAL LOW (ref 70–140)
Potassium: 4.1 mEq/L (ref 3.5–5.1)
Sodium: 143 mEq/L (ref 136–145)
TOTAL PROTEIN: 6.6 g/dL (ref 6.4–8.3)

## 2016-07-28 MED ORDER — IOPAMIDOL (ISOVUE-300) INJECTION 61%
100.0000 mL | Freq: Once | INTRAVENOUS | Status: AC | PRN
  Administered 2016-07-28: 100 mL via INTRAVENOUS

## 2016-08-01 ENCOUNTER — Encounter: Payer: Self-pay | Admitting: Internal Medicine

## 2016-08-01 ENCOUNTER — Telehealth: Payer: Self-pay | Admitting: Internal Medicine

## 2016-08-01 ENCOUNTER — Ambulatory Visit (HOSPITAL_BASED_OUTPATIENT_CLINIC_OR_DEPARTMENT_OTHER): Payer: Medicare Other | Admitting: Internal Medicine

## 2016-08-01 VITALS — BP 136/79 | HR 88 | Temp 98.2°F | Resp 18 | Ht 60.0 in | Wt 146.4 lb

## 2016-08-01 DIAGNOSIS — L270 Generalized skin eruption due to drugs and medicaments taken internally: Secondary | ICD-10-CM

## 2016-08-01 DIAGNOSIS — Z5111 Encounter for antineoplastic chemotherapy: Secondary | ICD-10-CM

## 2016-08-01 DIAGNOSIS — R197 Diarrhea, unspecified: Secondary | ICD-10-CM

## 2016-08-01 DIAGNOSIS — C3492 Malignant neoplasm of unspecified part of left bronchus or lung: Secondary | ICD-10-CM

## 2016-08-01 DIAGNOSIS — C3432 Malignant neoplasm of lower lobe, left bronchus or lung: Secondary | ICD-10-CM

## 2016-08-01 DIAGNOSIS — C7800 Secondary malignant neoplasm of unspecified lung: Secondary | ICD-10-CM

## 2016-08-01 MED ORDER — METHYLPREDNISOLONE 4 MG PO TBPK: ORAL_TABLET | ORAL | 0 refills | Status: DC

## 2016-08-01 MED ORDER — DOXYCYCLINE HYCLATE 100 MG PO TABS: 100.0000 mg | ORAL_TABLET | Freq: Two times a day (BID) | ORAL | 0 refills | Status: DC

## 2016-08-01 MED FILL — METHYLPREDNISOLONE 4 MG TAB: 4 | 6 days supply | Qty: 21 | Fill #0

## 2016-08-01 MED FILL — DOXYCYCLINE HYCLATE 100 MG: 100 | 10 days supply | Qty: 20 | Fill #0

## 2016-08-01 NOTE — Telephone Encounter (Signed)
Avs report and appointment schedule given to patient, per 08/01/16 los.

## 2016-08-01 NOTE — Progress Notes (Signed)
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  PRIOR THERAPY: Gilotrif 40 mg by mouth daily. First dose started on 05/11/2015. Status post 9 months of treatment. This was discontinued on 02/17/2016 secondary to extensive skin rash.   CURRENT THERAPY: Gilotrif 30 mg by mouth daily. First dose start on 02/24/2016. Status post 5 months of treatment.  INTERVAL HISTORY: Elizabeth [name] 70 y.o. female returns to the clinic today for follow-up visit accompanied by her husband. The patient is feeling much better today with no specific complaints except for several papular skin rash on the lower extremities. She also has intermittent diarrhea. She denied having any significant chest pain, shortness of breath, cough or hemoptysis. She has no significant weight loss or night sweats. She denied having any significant nausea or vomiting, no fever or chills. She had repeat CT scan of the chest, abdomen and pelvis performed recently and she is here for evaluation and discussion of her scan results.  MEDICAL HISTORY: Past Medical History:  Diagnosis Date  . Adenocarcinoma of left lung, stage 4 ([place]) 04/05/2015   Biopsy confirmed CT SCAN: There are innumerable bilateral pulmonary nodules. These range in size from about 5 mm to 2 cm. They demonstrate irregular indistinct borders, the larger ones demonstrating spiculation. PET SCAN: Diffuse pulmonary metastatic disease with a 4 cm dominant left lower low lung lesion. No enlarged or hypermetabolic mediastinal or hilar adenopathy.  . Allergy   . Anxiety   .  Cancer ([place])   . Drug-induced skin rash 01/17/2016  . Hyperlipidemia   . Hypertension   . Insomnia   . Prediabetes   . Thyroid disease     ALLERGIES:  is allergic to penicillins.  MEDICATIONS:  Current Outpatient Prescriptions  Medication Sig Dispense Refill  . ALPRAZolam (XANAX) 1 MG tablet TAKE 1/2 TO 1 TABLET BY MOUTH THREE TIMES DAILY AS NEEDED FOR ANXIETY 270 tablet 1  . aspirin 81 MG tablet Take 81 mg by mouth at bedtime.     . Cholecalciferol (VITAMIN D3) 10000 UNITS TABS Take by mouth.    . clindamycin (CLEOCIN T) 1 % external solution Apply 1 application topically daily as needed. (Patient not taking: Reported on 05/25/2016) 30 mL 1  . Cyanocobalamin (VITAMIN B 12 PO) Take 1 tablet by mouth daily.    Marland Kitchen doxycycline (VIBRA-TABS) 100 MG tablet Take 1 tablet (100 mg total) by mouth 2 (two) times daily. 20 tablet 0  . GILOTRIF 30 MG tablet TAKE 1 TABLET BY MOUTH DAILY. TAKE ON AN EMPTY STOMACH 1HR BEFORE OR 2 HOURS AFTER MEALS. 30 tablet 2  . HYDROcodone-homatropine (HYCODAN) 5-1.5 MG/5ML syrup Take 5 mLs by mouth every 6 (six) hours as needed for cough. (Patient not taking: Reported on 05/25/2016) 120 mL 0  . hydrocortisone cream 0.5 % Apply 1 application topically 2 (two) times daily.    Marland Kitchen levothyroxine (SYNTHROID, LEVOTHROID) 50 MCG tablet TAKE 1 TABLET BY MOUTH EVERY DAY 90 tablet 0  . magnesium 30 MG tablet Take 30 mg by mouth 2 (two) times daily.    . methylPREDNISolone (MEDROL DOSEPAK) 4 MG TBPK tablet Take  per package instructions 21 tablet 0  . montelukast (SINGULAIR) 10 MG tablet TAKE 1 TABLET BY MOUTH EVERY DAY 90 tablet 1  . Triprolidine-Pseudoephedrine (ANTIHISTAMINE PO) Take by mouth. Reported on 04/26/2016     No current facility-administered medications for this visit.     SURGICAL HISTORY:  Past Surgical History:  Procedure Laterality Date  . ABDOMINAL HYSTERECTOMY  1995   w BSO  . TONSILLECTOMY AND ADENOIDECTOMY      REVIEW OF SYSTEMS:  Constitutional:  negative Eyes: negative Ears, nose, mouth, throat, and face: negative Respiratory: negative Cardiovascular: negative Gastrointestinal: negative Genitourinary:negative Integument/breast: negative Hematologic/lymphatic: negative Musculoskeletal:negative Neurological: negative Behavioral/Psych: negative Endocrine: negative Allergic/Immunologic: negative   PHYSICAL EXAMINATION: General appearance: alert, cooperative and no distress Head: Normocephalic, without obvious abnormality, atraumatic Neck: no adenopathy, no JVD, supple, symmetrical, trachea midline and thyroid not enlarged, symmetric, no tenderness/mass/nodules Lymph nodes: Cervical, supraclavicular, and axillary nodes normal. Resp: clear to auscultation bilaterally Back: symmetric, no curvature. ROM normal. No CVA tenderness. Cardio: regular rate and rhythm, S1, S2 normal, no murmur, click, rub or gallop GI: soft, non-tender; bowel sounds normal; no masses,  no organomegaly Extremities: extremities normal, atraumatic, no cyanosis or edema Neurologic: Alert and oriented X 3, normal strength and tone. Normal symmetric reflexes. Normal coordination and gait   ECOG PERFORMANCE STATUS: 0 - Asymptomatic  Blood pressure 136/79, pulse 88, temperature 98.2 F (36.8 C), temperature source Oral, resp. rate 18, height 5' (1.524 m), weight 146 lb 6.4 oz (66.4 kg), SpO2 99 %.  LABORATORY DATA: Lab Results  Component Value Date   WBC 7.8 07/28/2016   HGB 12.6 07/28/2016   HCT 38.6 07/28/2016   MCV 82.8 07/28/2016   PLT 183 07/28/2016      Chemistry      Component Value Date/Time   NA 143 07/28/2016 1050   K 4.1 07/28/2016 1050   CL 105 05/23/2016 1234   CO2 27 07/28/2016 1050   BUN 18.2 07/28/2016 1050   CREATININE 0.8 07/28/2016 1050      Component Value Date/Time   CALCIUM 9.2 07/28/2016 1050   ALKPHOS 80 07/28/2016 1050   AST 17 07/28/2016 1050   ALT 22 07/28/2016 1050   BILITOT 1.17 07/28/2016 1050        RADIOGRAPHIC STUDIES: Ct Chest W Contrast  Result Date: 07/28/2016 CLINICAL DATA:  Restaging left lung cancer. EXAM: CT CHEST, ABDOMEN, AND PELVIS WITH CONTRAST TECHNIQUE: Multidetector CT imaging of the chest, abdomen and pelvis was performed following the standard protocol during bolus administration of intravenous contrast. CONTRAST:  111m ISOVUE-300 IOPAMIDOL (ISOVUE-300) INJECTION 61% COMPARISON:  04/17/2016 FINDINGS: CT CHEST FINDINGS Chest wall: No breast masses, supraclavicular or axillary lymphadenopathy. Cardiovascular: The heart is normal in size. No pericardial effusion. Stable pectus deformity with mass effect on the right arm. The aorta is normal in caliber. No dissection. The branch vessels are patent. Mediastinum/Nodes: No mediastinal or hilar mass or adenopathy. Small scattered lymph nodes are unchanged. The esophagus is grossly normal. Lungs/Pleura: Stable multi focal wall a irregular nodular and linear densities in both lungs consistent with treated metastasis. Stable area of volume loss, architectural distortion and bronchiectasis in the left lower lobe is unchanged. This may be radiation related. No new pulmonary lesions.  No acute overlying pulmonary process. Musculoskeletal: No significant bony findings. CT ABDOMEN PELVIS FINDINGS Hepatobiliary: Stable 3.5 cm right hepatic lobe hemangioma. No worrisome hepatic lesions to suggest metastatic disease. The gallbladder is normal. No common bile duct dilatation. Pancreas: No mass, inflammation or ductal  dilatation. Spleen: Normal size. No focal lesions. Stable calcified granulomas. Adrenals/Urinary Tract: The adrenal glands and kidneys are normal. Stomach/Bowel: The stomach, duodenum, small bowel and colon are unremarkable. No inflammatory changes, mass lesions or obstructive findings. Moderate stool throughout the colon may suggest constipation. The terminal ileum and appendix are normal. Vascular/Lymphatic: No mesenteric or  retroperitoneal mass or adenopathy. Stable scattered atherosclerotic calcifications involving the aorta. The branch vessels are patent. The major venous structures are patent. Reproductive: The uterus is surgically absent. The ovaries are not identified. Other: No pelvic mass or adenopathy. No free pelvic fluid collections. The bladder appears normal. No inguinal mass or adenopathy. Musculoskeletal: No significant bony findings. Moderate degenerative disc disease at L5-S1. IMPRESSION: Stable CT appearance of the chest, abdomen and pelvis. Evidence of treated pulmonary metastatic disease without findings for recurrent metastatic disease. Stable hepatic hemangioma. No metastatic disease in the abdomen/ pelvis. Electronically Signed   By: Marijo Sanes M.D.   On: 07/28/2016 14:58   Ct Abdomen Pelvis W Contrast  Result Date: 07/28/2016 CLINICAL DATA:  Restaging left lung cancer. EXAM: CT CHEST, ABDOMEN, AND PELVIS WITH CONTRAST TECHNIQUE: Multidetector CT imaging of the chest, abdomen and pelvis was performed following the standard protocol during bolus administration of intravenous contrast. CONTRAST:  115m ISOVUE-300 IOPAMIDOL (ISOVUE-300) INJECTION 61% COMPARISON:  04/17/2016 FINDINGS: CT CHEST FINDINGS Chest wall: No breast masses, supraclavicular or axillary lymphadenopathy. Cardiovascular: The heart is normal in size. No pericardial effusion. Stable pectus deformity with mass effect on the right arm. The aorta is normal in caliber. No dissection. The branch vessels are patent. Mediastinum/Nodes: No mediastinal or hilar mass or adenopathy. Small scattered lymph nodes are unchanged. The esophagus is grossly normal. Lungs/Pleura: Stable multi focal wall a irregular nodular and linear densities in both lungs consistent with treated metastasis. Stable area of volume loss, architectural distortion and bronchiectasis in the left lower lobe is unchanged. This may be radiation related. No new pulmonary lesions.  No  acute overlying pulmonary process. Musculoskeletal: No significant bony findings. CT ABDOMEN PELVIS FINDINGS Hepatobiliary: Stable 3.5 cm right hepatic lobe hemangioma. No worrisome hepatic lesions to suggest metastatic disease. The gallbladder is normal. No common bile duct dilatation. Pancreas: No mass, inflammation or ductal dilatation. Spleen: Normal size. No focal lesions. Stable calcified granulomas. Adrenals/Urinary Tract: The adrenal glands and kidneys are normal. Stomach/Bowel: The stomach, duodenum, small bowel and colon are unremarkable. No inflammatory changes, mass lesions or obstructive findings. Moderate stool throughout the colon may suggest constipation. The terminal ileum and appendix are normal. Vascular/Lymphatic: No mesenteric or retroperitoneal mass or adenopathy. Stable scattered atherosclerotic calcifications involving the aorta. The branch vessels are patent. The major venous structures are patent. Reproductive: The uterus is surgically absent. The ovaries are not identified. Other: No pelvic mass or adenopathy. No free pelvic fluid collections. The bladder appears normal. No inguinal mass or adenopathy. Musculoskeletal: No significant bony findings. Moderate degenerative disc disease at L5-S1. IMPRESSION: Stable CT appearance of the chest, abdomen and pelvis. Evidence of treated pulmonary metastatic disease without findings for recurrent metastatic disease. Stable hepatic hemangioma. No metastatic disease in the abdomen/ pelvis. Electronically Signed   By: PMarijo SanesM.D.   On: 07/28/2016 14:58    ASSESSMENT AND PLAN: This is a very pleasant 70years old white female recently diagnosed with a stage IV non-small cell lung cancer, adenocarcinoma with positive EGFR mutation with deletion in exon 19. The patient was started on treatment with Gilotrif 40 mg by mouth daily, status post 9 months  of treatment. This was discontinued today secondary to significant skin rash. She is currently  on reduced dose of Gilotrif to 30 mg by mouth daily status post 5 months. She is tolerating the treatment well except for recurrent skin rash mainly on the lower extremities as well as intermittent diarrhea. The recent CT scan of the chest, abdomen and pelvis showed no evidence for disease progression. I discussed the scan results with the patient and her husband. I recommended for the patient to continue her treatment with Gilotrif with the same dose as scheduled.  For the skin rash, I gave the patient prescription for doxycycline 100 mg by mouth twice a day for 10 days in addition to Medrol Dosepak. I also asked the patient to see her dermatologist for other recommendations. For the diarrhea, the patient will continue with the imodium on as-needed basis. The patient would come back for follow-up visit in one month for reevaluation and repeat blood work. She was advised to call immediately if she has any concerning symptoms in the interval. The patient voices understanding of current disease status and treatment options and is in agreement with the current care plan.  All questions were answered. The patient knows to call the clinic with any problems, questions or concerns. We can certainly see the patient much sooner if necessary.  Disclaimer: This note was dictated with voice recognition software. Similar sounding words can inadvertently be transcribed and may not be corrected upon review.

## 2016-08-05 IMAGING — CT CT ANGIO CHEST
2 of 9 series · 18 of 46 positions shown · IV contrast (Omni 300)
Comparison: Chest radiograph performed earlier today at [DATE] p.m.,
and PET/CT performed 03/25/2015

CLINICAL DATA: Acute onset of shortness breath and generalized
chest pain. Current history of stage IV lung cancer. Initial
encounter.

EXAM:
CT ANGIOGRAPHY CHEST WITH CONTRAST
TECHNIQUE: Multidetector CT imaging of the chest was performed using the
standard protocol during bolus administration of intravenous
contrast. Multiplanar CT image reconstructions and MIPs were
obtained to evaluate the vascular anatomy.
CONTRAST:  80mL OMNIPAQUE IOHEXOL 350 MG/ML SOLN

[Series 5: thins · axial · 0.67mm/px · z∈[+1053,+1288]mm · 15 of 265 slices shown]
[im 15/265  lung]
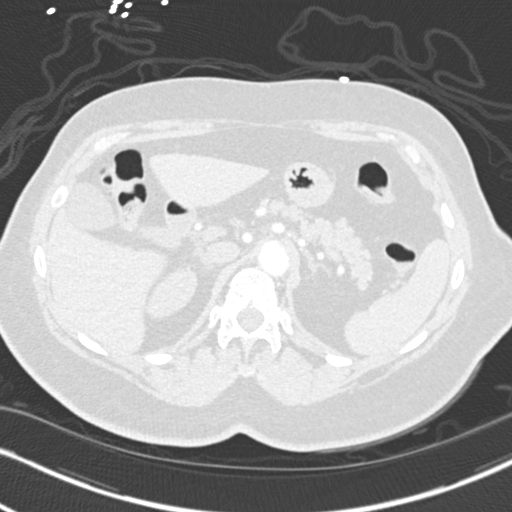
[im 30/265  soft-tissue]
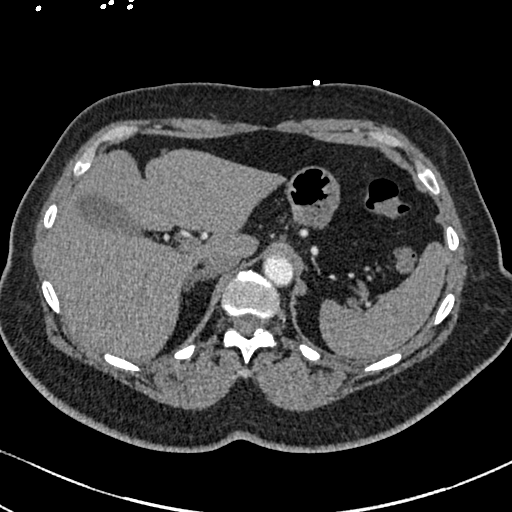
[im 45/265  lung]
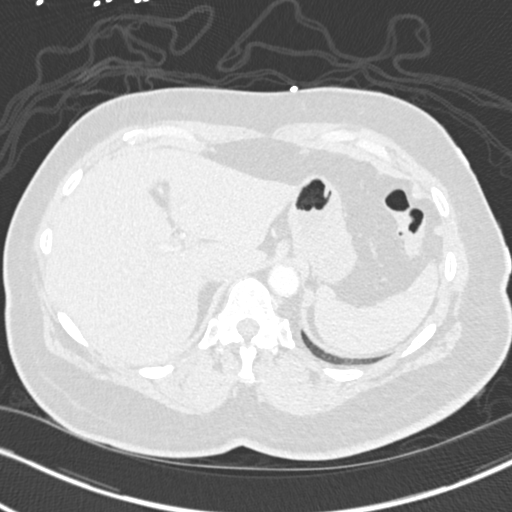
[im 59/265  soft-tissue]
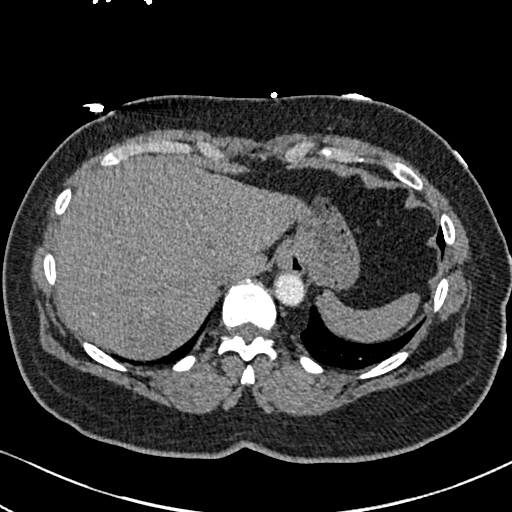
[im 89/265  lung]
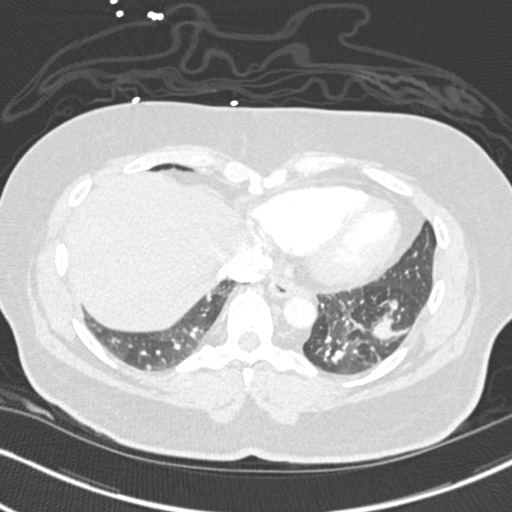
[im 103/265  soft-tissue]
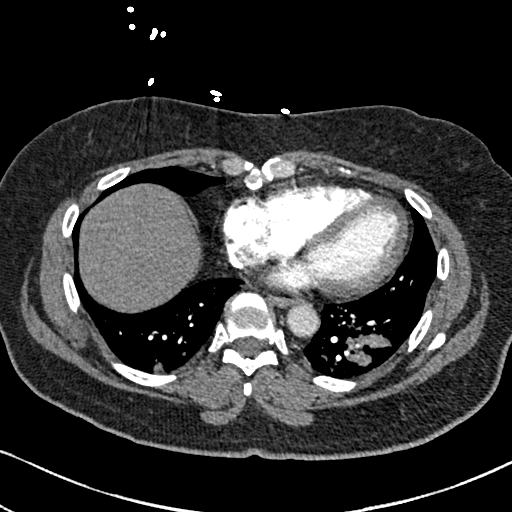
[im 118/265  lung]
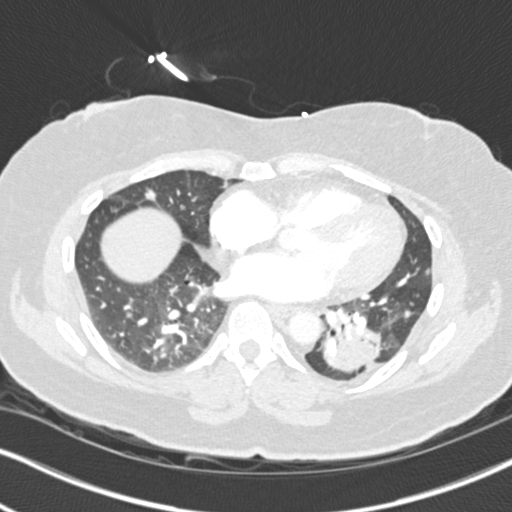
[im 133/265  soft-tissue]
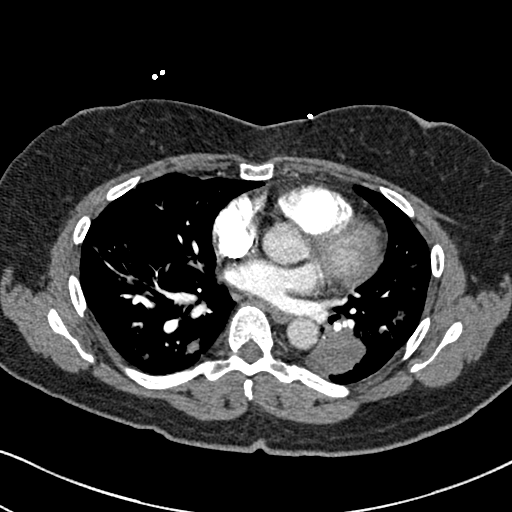
[im 147/265  lung]
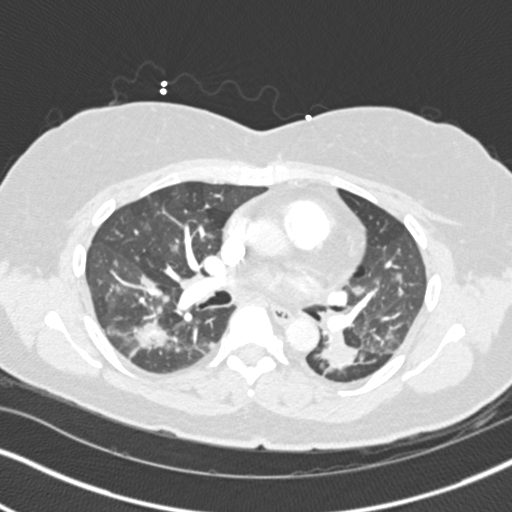
[im 162/265  soft-tissue]
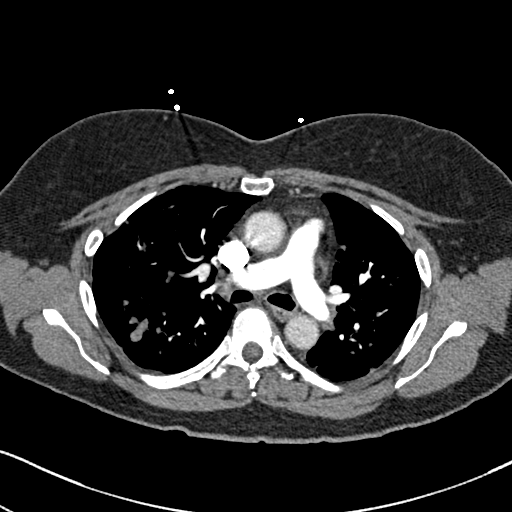
[im 177/265  lung]
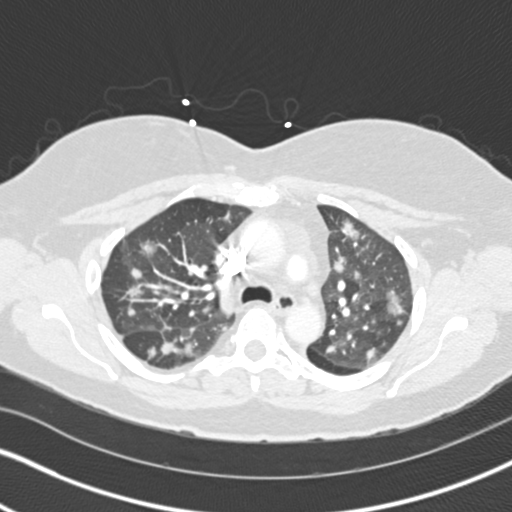
[im 206/265  soft-tissue]
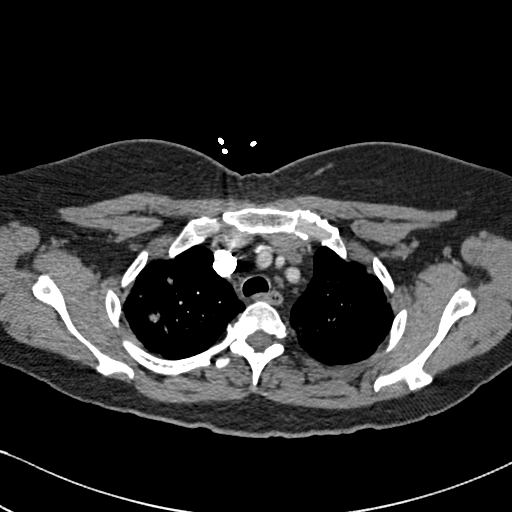
[im 221/265  lung]
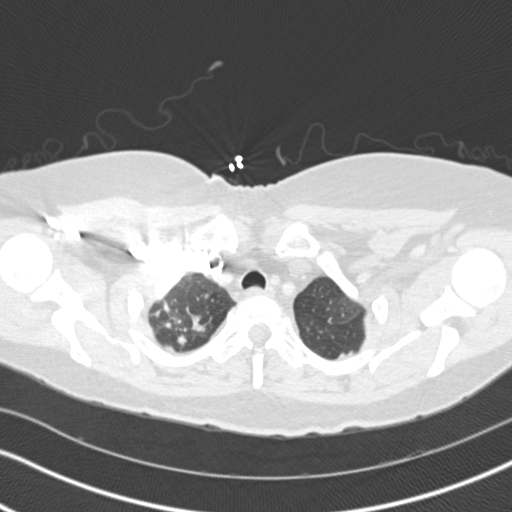
[im 235/265  soft-tissue]
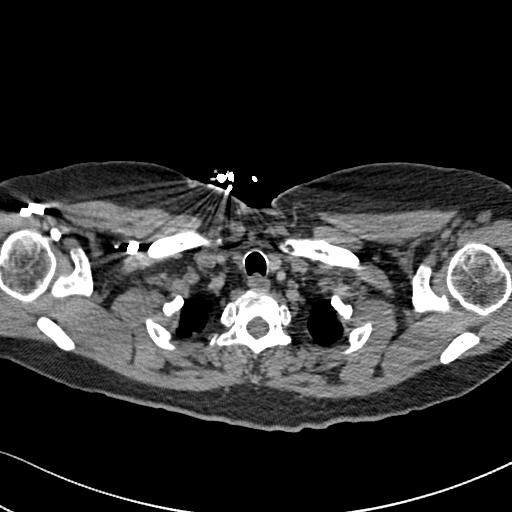
[im 250/265  lung]
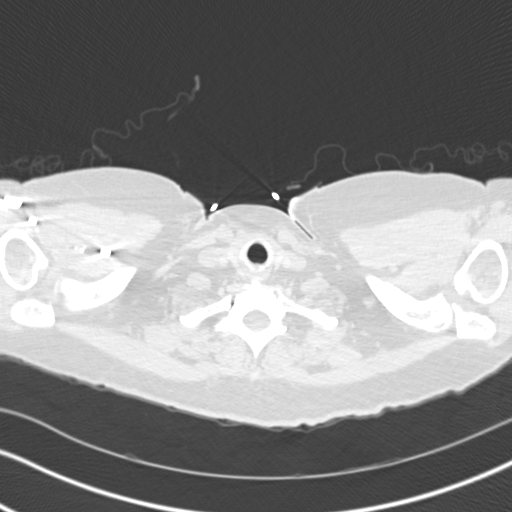

[Series 7: coronal mpr · coronal · 0.53mm/px · 3 of 126 slices shown]
[im 32/126  soft-tissue]
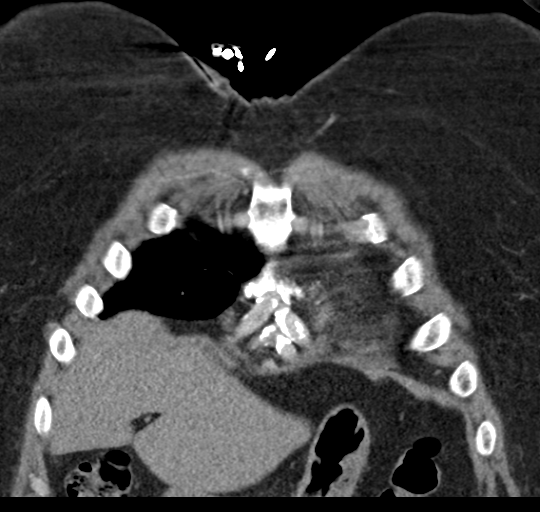
[im 63/126  soft-tissue]
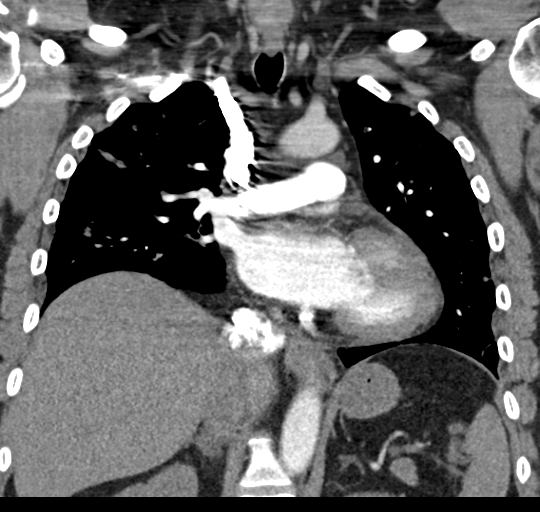
[im 94/126  soft-tissue]
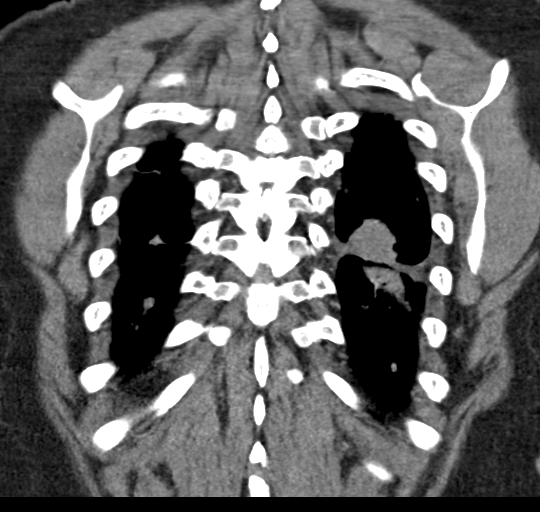

[18 of 46 positions shown; findings below may reference images not displayed]

FINDINGS: There is no evidence of pulmonary embolus.

Numerous bilateral pulmonary nodules are noted, with a dominant
cm mass at the left lung base. Mild underlying interstitial
prominence is noted, without definite evidence of lymphangitic
spread of tumor. Findings are compatible with diffuse metastatic
disease. The extent of metastatic disease is relatively stable from
the prior PET/CT.

No superimposed focal airspace consolidation is seen. Mild
interstitial prominence may reflect mild superimposed interstitial
edema. No pleural effusion or pneumothorax is seen.

The mediastinum is unremarkable in appearance. No mediastinal
lymphadenopathy is seen. No pericardial effusion is identified. The
great vessels are grossly unremarkable. No axillary lymphadenopathy
is seen. The visualized portions of the thyroid gland are
unremarkable in appearance.

The visualized portions of the liver and spleen are unremarkable. A
tiny hiatal hernia is noted. The visualized portions of the
gallbladder, pancreas and adrenal glands are within normal limits.

No acute osseous abnormalities are seen.

Review of the MIP images confirms the above findings.
IMPRESSION: 1. No evidence of pulmonary embolus.
2. Mild interstitial prominence, suggestive of mild superimposed
interstitial edema.
3. Numerous bilateral pulmonary nodules again noted, with a dominant
5.3 cm mass at the left lung base. Findings compatible with diffuse
metastatic disease, relatively stable from the recent prior PET/CT.
4. Tiny hiatal hernia noted.

## 2016-08-14 ENCOUNTER — Encounter: Payer: Self-pay | Admitting: Internal Medicine

## 2016-08-17 ENCOUNTER — Other Ambulatory Visit: Payer: Self-pay | Admitting: Internal Medicine

## 2016-08-18 MED FILL — GILOTRIF 30 MG TABLET: 30 | 30 days supply | Qty: 30 | Fill #0

## 2016-08-29 ENCOUNTER — Encounter: Payer: Self-pay | Admitting: Internal Medicine

## 2016-08-29 ENCOUNTER — Ambulatory Visit (HOSPITAL_BASED_OUTPATIENT_CLINIC_OR_DEPARTMENT_OTHER): Payer: Medicare Other | Admitting: Internal Medicine

## 2016-08-29 ENCOUNTER — Telehealth: Payer: Self-pay | Admitting: Internal Medicine

## 2016-08-29 ENCOUNTER — Other Ambulatory Visit (HOSPITAL_BASED_OUTPATIENT_CLINIC_OR_DEPARTMENT_OTHER): Payer: Medicare Other

## 2016-08-29 VITALS — BP 134/70 | HR 70 | Temp 97.9°F | Resp 16 | Ht 60.0 in | Wt 147.3 lb

## 2016-08-29 DIAGNOSIS — R197 Diarrhea, unspecified: Secondary | ICD-10-CM | POA: Diagnosis not present

## 2016-08-29 DIAGNOSIS — C3432 Malignant neoplasm of lower lobe, left bronchus or lung: Secondary | ICD-10-CM | POA: Diagnosis not present

## 2016-08-29 DIAGNOSIS — C7800 Secondary malignant neoplasm of unspecified lung: Secondary | ICD-10-CM | POA: Diagnosis not present

## 2016-08-29 DIAGNOSIS — C3492 Malignant neoplasm of unspecified part of left bronchus or lung: Secondary | ICD-10-CM

## 2016-08-29 DIAGNOSIS — L270 Generalized skin eruption due to drugs and medicaments taken internally: Secondary | ICD-10-CM

## 2016-08-29 DIAGNOSIS — Z5111 Encounter for antineoplastic chemotherapy: Secondary | ICD-10-CM

## 2016-08-29 LAB — CBC WITH DIFFERENTIAL/PLATELET
BASO%: 0.9 % (ref 0.0–2.0)
Basophils Absolute: 0 10*3/uL (ref 0.0–0.1)
EOS%: 4.2 % (ref 0.0–7.0)
Eosinophils Absolute: 0.2 10*3/uL (ref 0.0–0.5)
HEMATOCRIT: 37 % (ref 34.8–46.6)
HEMOGLOBIN: 12.2 g/dL (ref 11.6–15.9)
LYMPH#: 1.2 10*3/uL (ref 0.9–3.3)
LYMPH%: 27.1 % (ref 14.0–49.7)
MCH: 27.8 pg (ref 25.1–34.0)
MCHC: 33 g/dL (ref 31.5–36.0)
MCV: 84.3 fL (ref 79.5–101.0)
MONO#: 0.4 10*3/uL (ref 0.1–0.9)
MONO%: 8.4 % (ref 0.0–14.0)
NEUT#: 2.7 10*3/uL (ref 1.5–6.5)
NEUT%: 59.4 % (ref 38.4–76.8)
PLATELETS: 175 10*3/uL (ref 145–400)
RBC: 4.39 10*6/uL (ref 3.70–5.45)
RDW: 13.8 % (ref 11.2–14.5)
WBC: 4.5 10*3/uL (ref 3.9–10.3)

## 2016-08-29 LAB — COMPREHENSIVE METABOLIC PANEL
ALBUMIN: 3.3 g/dL — AB (ref 3.5–5.0)
ALK PHOS: 76 U/L (ref 40–150)
ALT: 25 U/L (ref 0–55)
ANION GAP: 7 meq/L (ref 3–11)
AST: 22 U/L (ref 5–34)
BILIRUBIN TOTAL: 0.79 mg/dL (ref 0.20–1.20)
BUN: 15.3 mg/dL (ref 7.0–26.0)
CALCIUM: 8.9 mg/dL (ref 8.4–10.4)
CO2: 27 mEq/L (ref 22–29)
CREATININE: 1.1 mg/dL (ref 0.6–1.1)
Chloride: 106 mEq/L (ref 98–109)
EGFR: 53 mL/min/{1.73_m2} — ABNORMAL LOW (ref >90)
Glucose: 78 mg/dl (ref 70–140)
Potassium: 4.4 mEq/L (ref 3.5–5.1)
Sodium: 140 mEq/L (ref 136–145)
TOTAL PROTEIN: 6.7 g/dL (ref 6.4–8.3)

## 2016-08-29 NOTE — Progress Notes (Signed)
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  PRIOR THERAPY: Gilotrif 40 mg by mouth daily. First dose started on 05/11/2015. Status post 9 months of treatment. This was discontinued on 02/17/2016 secondary to extensive skin rash.   CURRENT THERAPY: Gilotrif 30 mg by mouth daily. First dose start on 02/24/2016. Status post 6 months of treatment.  INTERVAL HISTORY: Elizabeth [name] 70 y.o. female returns to the clinic today for follow-up visit accompanied by her husband. The patient is feeling much better today with no specific complaints. She also has intermittent diarrhea but well controlled with Imodium. She denied having any significant chest pain, shortness of breath, cough or hemoptysis. She has no significant weight loss or night sweats. She denied having any significant nausea or vomiting, no fever or chills. She is here today for evaluation and repeat blood work.  MEDICAL HISTORY: Past Medical History:  Diagnosis Date  . Adenocarcinoma of left lung, stage 4 ([place]) 04/05/2015   Biopsy confirmed CT SCAN: There are innumerable bilateral pulmonary nodules. These range in size from about 5 mm to 2 cm. They demonstrate irregular indistinct borders, the larger ones demonstrating spiculation. PET SCAN: Diffuse pulmonary metastatic disease with a 4 cm dominant left lower low lung lesion. No enlarged or hypermetabolic mediastinal or hilar adenopathy.  . Allergy   . Anxiety   . Cancer ([place])   . Drug-induced skin rash 01/17/2016  . Hyperlipidemia   . Hypertension   . Insomnia   . Prediabetes     . Thyroid disease     ALLERGIES:  is allergic to penicillins.  MEDICATIONS:  Current Outpatient Prescriptions  Medication Sig Dispense Refill  . ALPRAZolam (XANAX) 1 MG tablet TAKE 1/2 TO 1 TABLET BY MOUTH THREE TIMES DAILY AS NEEDED FOR ANXIETY 270 tablet 1  . aspirin 81 MG tablet Take 81 mg by mouth at bedtime.     . Cholecalciferol (VITAMIN D3) 10000 UNITS TABS Take by mouth.    . clindamycin (CLEOCIN T) 1 % external solution Apply 1 application topically daily as needed. 30 mL 1  . Cyanocobalamin (VITAMIN B 12 PO) Take 1 tablet by mouth daily.    Marland Kitchen GILOTRIF 30 MG tablet TAKE 1 TABLET BY MOUTH DAILY. TAKE ON AN EMPTY STOMACH 1 HOUR BEFORE OR 2 HOURS AFTER MEALS. 30 tablet 2  . hydrocortisone cream 0.5 % Apply 1 application topically 2 (two) times daily.    Marland Kitchen levothyroxine (SYNTHROID, LEVOTHROID) 50 MCG tablet TAKE 1 TABLET BY MOUTH EVERY DAY 90 tablet 0  . magnesium 30 MG tablet Take 30 mg by mouth 2 (two) times daily.    . montelukast (SINGULAIR) 10 MG tablet TAKE 1 TABLET BY MOUTH EVERY DAY 90 tablet 1  . Triprolidine-Pseudoephedrine (ANTIHISTAMINE PO) Take by mouth. Reported on 04/26/2016    . HYDROcodone-homatropine (HYCODAN) 5-1.5 MG/5ML syrup Take 5 mLs by mouth every 6 (six) hours as needed for cough. (Patient not taking: Reported on 08/29/2016) 120 mL 0   No current facility-administered medications for this visit.     SURGICAL HISTORY:  Past Surgical History:  Procedure Laterality Date  . ABDOMINAL  HYSTERECTOMY  1995   w BSO  . TONSILLECTOMY AND ADENOIDECTOMY      REVIEW OF SYSTEMS:  A comprehensive review of systems was negative.   PHYSICAL EXAMINATION: General appearance: alert, cooperative and no distress Head: Normocephalic, without obvious abnormality, atraumatic Neck: no adenopathy, no JVD, supple, symmetrical, trachea midline and thyroid not enlarged, symmetric, no tenderness/mass/nodules Lymph nodes: Cervical, supraclavicular, and axillary nodes normal. Resp:  clear to auscultation bilaterally Back: symmetric, no curvature. ROM normal. No CVA tenderness. Cardio: regular rate and rhythm, S1, S2 normal, no murmur, click, rub or gallop GI: soft, non-tender; bowel sounds normal; no masses,  no organomegaly Extremities: extremities normal, atraumatic, no cyanosis or edema Neurologic: Alert and oriented X 3, normal strength and tone. Normal symmetric reflexes. Normal coordination and gait   ECOG PERFORMANCE STATUS: 0 - Asymptomatic  Blood pressure 134/70, pulse 70, temperature 97.9 F (36.6 C), temperature source Oral, resp. rate 16, height 5' (1.524 m), weight 147 lb 4.8 oz (66.8 kg), SpO2 100 %.  LABORATORY DATA: Lab Results  Component Value Date   WBC 4.5 08/29/2016   HGB 12.2 08/29/2016   HCT 37.0 08/29/2016   MCV 84.3 08/29/2016   PLT 175 08/29/2016      Chemistry      Component Value Date/Time   NA 140 08/29/2016 1012   K 4.4 08/29/2016 1012   CL 105 05/23/2016 1234   CO2 27 08/29/2016 1012   BUN 15.3 08/29/2016 1012   CREATININE 1.1 08/29/2016 1012      Component Value Date/Time   CALCIUM 8.9 08/29/2016 1012   ALKPHOS 76 08/29/2016 1012   AST 22 08/29/2016 1012   ALT 25 08/29/2016 1012   BILITOT 0.79 08/29/2016 1012       RADIOGRAPHIC STUDIES: No results found.  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 The patient was started on treatment with Gilotrif 40 mg by mouth daily, status post 9 months of treatment. This was discontinued today secondary to significant skin rash. She is currently on reduced dose of Gilotrif to 30 mg by mouth daily status post 6 months. She is tolerating the treatment well. I recommended for the patient to continue her treatment with Gilotrif with the same dose as scheduled.  For the diarrhea, the patient will continue with the imodium on  as-needed basis. The patient would come back for follow-up visit in one month for reevaluation and repeat blood work. She was advised to call immediately if she has any concerning symptoms in the interval. The patient voices understanding of current disease status and treatment options and is in agreement with the current care plan.  All questions were answered. The patient knows to call the clinic with any problems, questions or concerns. We can certainly see the patient much sooner if necessary.  Disclaimer: This note was dictated with voice recognition software. Similar sounding words can inadvertently be transcribed and may not be corrected upon review.

## 2016-08-29 NOTE — Telephone Encounter (Signed)
Gave patient avs report and appointments for December  °

## 2016-09-03 ENCOUNTER — Encounter: Payer: Self-pay | Admitting: Internal Medicine

## 2016-09-03 ENCOUNTER — Other Ambulatory Visit: Payer: Self-pay | Admitting: Internal Medicine

## 2016-09-12 ENCOUNTER — Encounter: Payer: Self-pay | Admitting: Internal Medicine

## 2016-09-12 ENCOUNTER — Other Ambulatory Visit: Payer: Self-pay | Admitting: Internal Medicine

## 2016-09-12 DIAGNOSIS — C3492 Malignant neoplasm of unspecified part of left bronchus or lung: Secondary | ICD-10-CM

## 2016-09-12 DIAGNOSIS — Z5111 Encounter for antineoplastic chemotherapy: Secondary | ICD-10-CM

## 2016-09-12 MED FILL — GILOTRIF 30 MG TABLET: 30 | 30 days supply | Qty: 30 | Fill #1

## 2016-09-19 ENCOUNTER — Other Ambulatory Visit: Payer: Self-pay | Admitting: Medical Oncology

## 2016-09-19 DIAGNOSIS — C3492 Malignant neoplasm of unspecified part of left bronchus or lung: Secondary | ICD-10-CM

## 2016-09-19 DIAGNOSIS — Z5111 Encounter for antineoplastic chemotherapy: Secondary | ICD-10-CM

## 2016-09-19 MED ORDER — HYDROCODONE-HOMATROPINE 5-1.5 MG/5ML PO SYRP: 5.0000 mL | ORAL_SOLUTION | Freq: Four times a day (QID) | ORAL | 0 refills | Status: DC | PRN

## 2016-09-19 NOTE — Addendum Note (Signed)
Addended by: Ardeen Garland on: 09/19/2016 01:42 PM   Modules accepted: Orders

## 2016-09-20 ENCOUNTER — Encounter: Payer: Self-pay | Admitting: Internal Medicine

## 2016-09-26 ENCOUNTER — Ambulatory Visit (INDEPENDENT_AMBULATORY_CARE_PROVIDER_SITE_OTHER): Payer: Medicare Other | Admitting: Internal Medicine

## 2016-09-26 ENCOUNTER — Encounter: Payer: Self-pay | Admitting: Internal Medicine

## 2016-09-26 VITALS — BP 128/84 | HR 70 | Temp 98.2°F | Resp 16 | Ht 60.0 in | Wt 148.0 lb

## 2016-09-26 DIAGNOSIS — E782 Mixed hyperlipidemia: Secondary | ICD-10-CM

## 2016-09-26 DIAGNOSIS — E559 Vitamin D deficiency, unspecified: Secondary | ICD-10-CM

## 2016-09-26 DIAGNOSIS — E038 Other specified hypothyroidism: Secondary | ICD-10-CM

## 2016-09-26 DIAGNOSIS — Z136 Encounter for screening for cardiovascular disorders: Secondary | ICD-10-CM | POA: Diagnosis not present

## 2016-09-26 DIAGNOSIS — Z0000 Encounter for general adult medical examination without abnormal findings: Secondary | ICD-10-CM

## 2016-09-26 DIAGNOSIS — I10 Essential (primary) hypertension: Secondary | ICD-10-CM

## 2016-09-26 DIAGNOSIS — R7303 Prediabetes: Secondary | ICD-10-CM

## 2016-09-26 DIAGNOSIS — I700 Atherosclerosis of aorta: Secondary | ICD-10-CM

## 2016-09-26 DIAGNOSIS — Z79899 Other long term (current) drug therapy: Secondary | ICD-10-CM

## 2016-09-26 DIAGNOSIS — C3492 Malignant neoplasm of unspecified part of left bronchus or lung: Secondary | ICD-10-CM

## 2016-09-26 MED ORDER — PREDNISOLONE 15 MG/5ML PO SOLN: ORAL | 0 refills | Status: DC

## 2016-09-26 MED ORDER — CLOTRIMAZOLE 1 % EX OINT: TOPICAL_OINTMENT | CUTANEOUS | 0 refills | Status: DC

## 2016-09-26 MED ORDER — MUPIROCIN 2 % EX OINT: 1.0000 "application " | TOPICAL_OINTMENT | Freq: Two times a day (BID) | CUTANEOUS | 0 refills | Status: DC

## 2016-09-26 MED ORDER — IPRATROPIUM BROMIDE 0.03 % NA SOLN: 2.0000 | Freq: Three times a day (TID) | NASAL | 2 refills | Status: DC

## 2016-09-26 MED FILL — prednisoLONE 15 MG/5ML SYRP: 15 | 30 days supply | Qty: 450 | Fill #0

## 2016-09-26 MED FILL — MUPIROCIN 2% OINTMENT: 2 | 14 days supply | Qty: 22 | Fill #0

## 2016-09-26 MED FILL — IPRATROPIUM 0.03% SPRAY: 0.03 | 30 days supply | Qty: 30 | Fill #0

## 2016-09-26 NOTE — Progress Notes (Signed)
Complete Physical  Assessment and Plan:   1. Aortic atherosclerosis (HCC) -cont BP control -cont cholesterol control -diet and exercise  2. Essential hypertension -cont current meds -well controlled -dash diet -exercise as tolerated -monitor at home  3. Other specified hypothyroidism -cont levothyroxine -TSH  4. Hyperlipidemia -cont diet and exercise -monitor yearly  5. Prediabetes -cont diet and exercise  6. Vitamin D deficiency -cont Vit D -recheck levels at the cancer center  7. Medication management -has labs drawn at the cancer center -has appointment tomorrow  8.  Lung Adenocarcinoma -follows with Dr. Earlie Server -on current Glotinef -has skin intolerance to medication -will give prednisolone for scalp itching   Discussed med's effects and SE's. Screening labs and tests as requested with regular follow-up as recommended.  HPI  70 y.o. female  presents for a complete physical.  Her blood pressure has been controlled at home, today their BP is BP: 128/84.  She does not workout. She denies chest pain, shortness of breath, dizziness.   She is on cholesterol medication and denies myalgias. Her cholesterol is at goal. The cholesterol last visit was:  Lab Results  Component Value Date   CHOL 189 05/23/2016   HDL 55 05/23/2016   LDLCALC 114 05/23/2016   TRIG 100 05/23/2016   CHOLHDL 3.4 05/23/2016  .  She has been working on diet and exercise for prediabetes, she is on bASA, she is on ACE/ARB and denies foot ulcerations, hyperglycemia, hypoglycemia , increased appetite, nausea, paresthesia of the feet, polydipsia, polyuria, visual disturbances, vomiting and weight loss. Last A1C in the office was:  Lab Results  Component Value Date   HGBA1C 5.3 05/23/2016    Patient is on Vitamin D supplement.   Lab Results  Component Value Date   VD25OH 118 (H) 05/23/2016     She does have a chronic rash secondary to the [facility] treatment that she is on for her lung  cancer.  She is using eucerin cream on it and avoiding sun exposure.    Current Medications:  Current Outpatient Prescriptions on File Prior to Visit  Medication Sig Dispense Refill  . ALPRAZolam (XANAX) 1 MG tablet TAKE 1/2 TO 1 TABLET BY MOUTH THREE TIMES DAILY AS NEEDED FOR ANXIETY 270 tablet 1  . aspirin 81 MG tablet Take 81 mg by mouth at bedtime.     . Cholecalciferol (VITAMIN D3) 10000 UNITS TABS Take by mouth.    . clindamycin (CLEOCIN T) 1 % external solution Apply 1 application topically daily as needed. 30 mL 1  . Cyanocobalamin (VITAMIN B 12 PO) Take 1 tablet by mouth daily.    Marland Kitchen GILOTRIF 30 MG tablet TAKE 1 TABLET BY MOUTH DAILY. TAKE ON AN EMPTY STOMACH 1 HOUR BEFORE OR 2 HOURS AFTER MEALS. 30 tablet 2  . HYDROcodone-homatropine (HYCODAN) 5-1.5 MG/5ML syrup Take 5 mLs by mouth every 6 (six) hours as needed for cough. 120 mL 0  . hydrocortisone cream 0.5 % Apply 1 application topically 2 (two) times daily.    Marland Kitchen levothyroxine (SYNTHROID, LEVOTHROID) 50 MCG tablet TAKE 1 TABLET BY MOUTH EVERY DAY 90 tablet 0  . magnesium 30 MG tablet Take 30 mg by mouth 2 (two) times daily.    . montelukast (SINGULAIR) 10 MG tablet TAKE 1 TABLET BY MOUTH EVERY DAY 90 tablet 3  . Triprolidine-Pseudoephedrine (ANTIHISTAMINE PO) Take by mouth. Reported on 04/26/2016     No current facility-administered medications on file prior to visit.     Health Maintenance:  Immunization History  Administered Date(s) Administered  . Influenza, High Dose Seasonal PF 07/31/2014  . Influenza-Unspecified 09/13/2015, 07/23/2016  . Pneumococcal Conjugate-13 12/24/2015  . Pneumococcal Polysaccharide-23 07/22/2012  . Td 12/25/2005  . Zoster 01/09/2007    Tetanus:2008 Pneumovax: 2013 Flu vaccine:2017 Zostavax:2008 Pap: Hysterctomy MGM:1/17  DEXA:2013 Colonoscopy: 2014 Last Eye Exam: Changing to somebody else, was seeing Dr. Earl Gala, due in January  Patient Care Team: [name], MD as PCP - General  (Internal Medicine) Sharol Roussel, OD as Physician Assistant (Optometry) Jettie Booze, MD as Consulting Physician (Interventional Cardiology) Teena Irani, MD as Consulting Physician (Gastroenterology) Druscilla Brownie, MD as Consulting Physician (Dermatology) Clent Jacks, MD as Consulting Physician (Ophthalmology) Erroll Luna, MD as Consulting Physician (General Surgery)  Allergies:  Allergies  Allergen Reactions  . Penicillins Swelling and Rash    Medical History:  Past Medical History:  Diagnosis Date  . Adenocarcinoma of left lung, stage 4 ([place]) 04/05/2015   Biopsy confirmed CT SCAN: There are innumerable bilateral pulmonary nodules. These range in size from about 5 mm to 2 cm. They demonstrate irregular indistinct borders, the larger ones demonstrating spiculation. PET SCAN: Diffuse pulmonary metastatic disease with a 4 cm dominant left lower low lung lesion. No enlarged or hypermetabolic mediastinal or hilar adenopathy.  . Allergy   . Anxiety   . Cancer ([place])   . Drug-induced skin rash 01/17/2016  . Hyperlipidemia   . Hypertension   . Insomnia   . Prediabetes   . Thyroid disease     Surgical History:  Past Surgical History:  Procedure Laterality Date  . ABDOMINAL HYSTERECTOMY  1995   w BSO  . TONSILLECTOMY AND ADENOIDECTOMY      Family History:  Family History  Problem Relation Age of Onset  . Liver disease Mother   . Alcohol abuse Mother   . ALS Father   . Hypertension Father     Social History:  Social History  Substance Use Topics  . Smoking status: Former Smoker    Packs/day: 0.50    Years: 10.00    Quit date: 10/23/1974  . Smokeless tobacco: Never Used  . Alcohol use No     Comment: Rare    Review of Systems: Review of Systems  Constitutional: Negative for chills, fever and malaise/fatigue.  HENT: Negative for congestion, ear pain and sore throat.   Eyes: Negative.   Respiratory: Positive for shortness of breath. Negative for cough and  wheezing.   Cardiovascular: Negative for chest pain, palpitations and leg swelling.  Gastrointestinal: Positive for diarrhea. Negative for abdominal pain, blood in stool, constipation, heartburn and melena.  Genitourinary: Negative.   Skin: Positive for itching and rash.  Neurological: Negative for dizziness, sensory change, loss of consciousness and headaches.  Psychiatric/Behavioral: Negative for depression. The patient is not nervous/anxious and does not have insomnia.     Physical Exam: Estimated body mass index is 28.9 kg/m as calculated from the following:   Height as of this encounter: 5' (1.524 m).   Weight as of this encounter: 148 lb (67.1 kg). BP 128/84   Pulse 70   Temp 98.2 F (36.8 C) (Temporal)   Resp 16   Ht 5' (1.524 m)   Wt 148 lb (67.1 kg)   BMI 28.90 kg/m   General Appearance: Well nourished well developed, in no apparent distress.  Eyes: PERRLA, EOMs, conjunctiva no swelling or erythema ENT/Mouth: Ear canals normal without obstruction, swelling, erythema, or discharge.  TMs normal bilaterally with no erythema, bulging, retraction,  or loss of landmark.  Oropharynx moist and clear with no exudate, erythema, or swelling.   Neck: Supple, thyroid normal. No bruits.  No cervical adenopathy Respiratory: Respiratory effort normal, Breath sounds clear A&P without wheeze, rhonchi, rales.   Cardio: RRR without murmurs, rubs or gallops. Brisk peripheral pulses without edema.  Chest: symmetric, with normal excursions Breasts: Symmetric, without lumps, nipple discharge, retractions.  Abdomen: Soft, nontender, no guarding, rebound, hernias, masses, or organomegaly.  Lymphatics: Non tender without lymphadenopathy.  Musculoskeletal: Full ROM all peripheral extremities,5/5 strength, and normal gait.  Skin: Erythematous papular rash to the bilateral forearms and the neck.  Non-tender, warm and dry Neuro: Awake and oriented X 3, Cranial nerves intact, reflexes equal bilaterally.  Normal muscle tone, no cerebellar symptoms. Sensation intact.  Psych:  normal affect, Insight and Judgment appropriate.   EKG: WNL no changes.  Over 40 minutes of exam, counseling, chart review and critical decision making was performed  Loma Sousa Forcucci 2:15 PM [facility]

## 2016-09-27 ENCOUNTER — Telehealth: Payer: Self-pay | Admitting: Internal Medicine

## 2016-09-27 ENCOUNTER — Other Ambulatory Visit (HOSPITAL_BASED_OUTPATIENT_CLINIC_OR_DEPARTMENT_OTHER): Payer: Medicare Other

## 2016-09-27 ENCOUNTER — Ambulatory Visit (HOSPITAL_BASED_OUTPATIENT_CLINIC_OR_DEPARTMENT_OTHER): Payer: Medicare Other | Admitting: Internal Medicine

## 2016-09-27 ENCOUNTER — Encounter: Payer: Self-pay | Admitting: Internal Medicine

## 2016-09-27 ENCOUNTER — Other Ambulatory Visit: Payer: Self-pay | Admitting: Internal Medicine

## 2016-09-27 DIAGNOSIS — C7800 Secondary malignant neoplasm of unspecified lung: Secondary | ICD-10-CM | POA: Diagnosis not present

## 2016-09-27 DIAGNOSIS — C3432 Malignant neoplasm of lower lobe, left bronchus or lung: Secondary | ICD-10-CM | POA: Diagnosis not present

## 2016-09-27 DIAGNOSIS — Z5111 Encounter for antineoplastic chemotherapy: Secondary | ICD-10-CM

## 2016-09-27 DIAGNOSIS — R05 Cough: Secondary | ICD-10-CM | POA: Diagnosis not present

## 2016-09-27 DIAGNOSIS — C3492 Malignant neoplasm of unspecified part of left bronchus or lung: Secondary | ICD-10-CM

## 2016-09-27 DIAGNOSIS — R21 Rash and other nonspecific skin eruption: Secondary | ICD-10-CM | POA: Diagnosis not present

## 2016-09-27 LAB — COMPREHENSIVE METABOLIC PANEL
ALT: 33 U/L (ref 0–55)
ANION GAP: 11 meq/L (ref 3–11)
AST: 28 U/L (ref 5–34)
Albumin: 3.5 g/dL (ref 3.5–5.0)
Alkaline Phosphatase: 110 U/L (ref 40–150)
BUN: 19.7 mg/dL (ref 7.0–26.0)
CALCIUM: 9.4 mg/dL (ref 8.4–10.4)
CHLORIDE: 105 meq/L (ref 98–109)
CO2: 26 mEq/L (ref 22–29)
Creatinine: 0.9 mg/dL (ref 0.6–1.1)
EGFR: 67 mL/min/{1.73_m2} — ABNORMAL LOW (ref >90)
Glucose: 104 mg/dl (ref 70–140)
POTASSIUM: 4.3 meq/L (ref 3.5–5.1)
Sodium: 141 mEq/L (ref 136–145)
Total Bilirubin: 1.04 mg/dL (ref 0.20–1.20)
Total Protein: 7.4 g/dL (ref 6.4–8.3)

## 2016-09-27 LAB — CBC WITH DIFFERENTIAL/PLATELET
BASO%: 0.2 % (ref 0.0–2.0)
BASOS ABS: 0 10*3/uL (ref 0.0–0.1)
EOS%: 0.3 % (ref 0.0–7.0)
Eosinophils Absolute: 0 10*3/uL (ref 0.0–0.5)
HEMATOCRIT: 37.4 % (ref 34.8–46.6)
HGB: 12.4 g/dL (ref 11.6–15.9)
LYMPH#: 0.8 10*3/uL — AB (ref 0.9–3.3)
LYMPH%: 8.8 % — AB (ref 14.0–49.7)
MCH: 28.1 pg (ref 25.1–34.0)
MCHC: 33.2 g/dL (ref 31.5–36.0)
MCV: 84.8 fL (ref 79.5–101.0)
MONO#: 0.1 10*3/uL (ref 0.1–0.9)
MONO%: 1.3 % (ref 0.0–14.0)
NEUT#: 8 10*3/uL — ABNORMAL HIGH (ref 1.5–6.5)
NEUT%: 89.4 % — AB (ref 38.4–76.8)
Platelets: 199 10*3/uL (ref 145–400)
RBC: 4.41 10*6/uL (ref 3.70–5.45)
RDW: 13.4 % (ref 11.2–14.5)
WBC: 8.9 10*3/uL (ref 3.9–10.3)

## 2016-09-27 MED ORDER — HYDROCODONE-HOMATROPINE 5-1.5 MG/5ML PO SYRP: 5.0000 mL | ORAL_SOLUTION | Freq: Four times a day (QID) | ORAL | 0 refills | Status: DC | PRN

## 2016-09-27 NOTE — Progress Notes (Signed)
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 She presented with large mass in the left lower lobe in addition to multiple bilateral pulmonary nodules diagnosed in June 2016  PRIOR THERAPY: Gilotrif 40 mg by mouth daily. First dose started on 05/11/2015. Status post 9 months of treatment. This was discontinued on 02/17/2016 secondary to extensive skin rash.  CURRENT THERAPY: Gilotrif 30 mg by mouth daily started 02/24/2016, status post 7 months of treatment.  INTERVAL HISTORY: Elizabeth [name] 70 y.o. female returns to the clinic today for monthly follow-up visit. The patient has no complaints today except for cough and she is requesting refill of Hycodan. She is tolerating her current treatment with Gilotrif fairly well with no significant adverse effect except for dry skin and mild rash. She has occasional diarrhea. She denied having any significant chest pain, shortness of breath or hemoptysis. She had repeat CBC and comprehensive metabolic panel performed earlier today and she is here for evaluation and discussion of her lab results.  MEDICAL HISTORY: Past Medical History:  Diagnosis Date  . Adenocarcinoma of left lung, stage 4 ([place]) 04/05/2015   Biopsy confirmed CT SCAN: There are innumerable bilateral pulmonary nodules. These range in size from about 5 mm to 2 cm. They demonstrate irregular indistinct borders, the larger ones demonstrating spiculation. PET SCAN: Diffuse pulmonary metastatic disease with a 4 cm dominant left lower low lung lesion. No enlarged or hypermetabolic mediastinal or hilar adenopathy.  . Allergy   . Anxiety   . Cancer ([place])   . Drug-induced skin rash 01/17/2016  . Hyperlipidemia   . Hypertension   .  Insomnia   . Prediabetes   . Thyroid disease     ALLERGIES:  is allergic to penicillins.  MEDICATIONS:  Current Outpatient Prescriptions  Medication Sig Dispense Refill  . ALPRAZolam (XANAX) 1 MG tablet TAKE 1/2 TO 1 TABLET BY MOUTH THREE TIMES DAILY AS NEEDED FOR ANXIETY 270 tablet 1  . aspirin 81 MG tablet Take 81 mg by mouth at bedtime.     . Cholecalciferol (VITAMIN D3) 10000 UNITS TABS Take by mouth.    . clindamycin (CLEOCIN T) 1 % external solution Apply 1 application topically daily as needed. 30 mL 1  . Clotrimazole 1 % OINT Place a small amount of the external right lip for angular chelitis 56.7 g 0  . Cyanocobalamin (VITAMIN B 12 PO) Take 1 tablet by mouth daily.    Marland Kitchen GILOTRIF 30 MG tablet TAKE 1 TABLET BY MOUTH DAILY. TAKE ON AN EMPTY STOMACH 1 HOUR BEFORE OR 2 HOURS AFTER MEALS. 30 tablet 2  . HYDROcodone-homatropine (HYCODAN) 5-1.5 MG/5ML syrup Take 5 mLs by mouth every 6 (six) hours as needed for cough. 120 mL 0  . hydrocortisone cream 0.5 % Apply 1 application topically 2 (two) times daily.    Marland Kitchen ipratropium (ATROVENT) 0.03 % nasal spray Place 2 sprays into the nose 3 (three) times daily. 30 mL 2  . levothyroxine (SYNTHROID, LEVOTHROID) 50 MCG tablet TAKE 1 TABLET BY MOUTH EVERY DAY 90 tablet 0  . magnesium 30 MG tablet Take 30 mg by mouth 2 (two) times daily.    . montelukast (SINGULAIR) 10 MG tablet TAKE 1 TABLET BY MOUTH EVERY DAY 90 tablet 3  .  mupirocin ointment (BACTROBAN) 2 % Place 1 application into the nose 2 (two) times daily. 22 g 0  . prednisoLONE (PRELONE) 15 MG/5ML SOLN Please use enough to apply topically to the scalp for itching 450 mL 0  . Triprolidine-Pseudoephedrine (ANTIHISTAMINE PO) Take by mouth. Reported on 04/26/2016     No current facility-administered medications for this visit.     SURGICAL HISTORY:  Past Surgical History:  Procedure Laterality Date  . ABDOMINAL HYSTERECTOMY  1995   w BSO  . TONSILLECTOMY AND ADENOIDECTOMY      REVIEW OF  SYSTEMS:  A comprehensive review of systems was negative except for: Respiratory: positive for cough Integument/breast: positive for rash   PHYSICAL EXAMINATION: General appearance: alert, cooperative and no distress Head: Normocephalic, without obvious abnormality, atraumatic Neck: no adenopathy, no JVD, supple, symmetrical, trachea midline and thyroid not enlarged, symmetric, no tenderness/mass/nodules Lymph nodes: Cervical, supraclavicular, and axillary nodes normal. Resp: clear to auscultation bilaterally Back: symmetric, no curvature. ROM normal. No CVA tenderness. Cardio: regular rate and rhythm, S1, S2 normal, no murmur, click, rub or gallop and normal apical impulse GI: soft, non-tender; bowel sounds normal; no masses,  no organomegaly Extremities: extremities normal, atraumatic, no cyanosis or edema  ECOG PERFORMANCE STATUS: 1 - Symptomatic but completely ambulatory  Blood pressure 122/68, pulse 72, temperature 97.9 F (36.6 C), temperature source Oral, resp. rate 18, height 5' (1.524 m), weight 146 lb 11.2 oz (66.5 kg), SpO2 99 %.  LABORATORY DATA: Lab Results  Component Value Date   WBC 8.9 09/27/2016   HGB 12.4 09/27/2016   HCT 37.4 09/27/2016   MCV 84.8 09/27/2016   PLT 199 09/27/2016      Chemistry      Component Value Date/Time   NA 140 08/29/2016 1012   K 4.4 08/29/2016 1012   CL 105 05/23/2016 1234   CO2 27 08/29/2016 1012   BUN 15.3 08/29/2016 1012   CREATININE 1.1 08/29/2016 1012      Component Value Date/Time   CALCIUM 8.9 08/29/2016 1012   ALKPHOS 76 08/29/2016 1012   AST 22 08/29/2016 1012   ALT 25 08/29/2016 1012   BILITOT 0.79 08/29/2016 1012       RADIOGRAPHIC STUDIES: No results found.  ASSESSMENT AND PLAN: This is a very pleasant 70 years old white female with stage IV non-small cell lung cancer, adenocarcinoma with positive EGFR mutation with deletion in exon 19 diagnosed in June 2016. She was previously treated with Gilotrif 40 mg by  mouth daily for 9 months but this was discontinued secondary to intolerance. She is currently undergoing treatment with oral Gilotrif 30 mg by mouth daily status post 7 months She is tolerating her current treatment well with no concerning complaints except for mild skin rash and occasional episodes of diarrhea. I recommended for the patient to continue her treatment with Gilotrif I will see her back for follow-up visit in one months with repeat CT scan of the chest for restaging of her disease. For the cough, I gave the patient refill of Hycodan. She was advised to call immediately if she has any concerning symptoms in the interval. The patient voices understanding of current disease status and treatment options and is in agreement with the current care plan.  All questions were answered. The patient knows to call the clinic with any problems, questions or concerns. We can certainly see the patient much sooner if necessary.  I spent 10 minutes counseling the patient face to face. The total time spent  in the appointment was 15 minutes.  Disclaimer: This note was dictated with voice recognition software. Similar sounding words can inadvertently be transcribed and may not be corrected upon review.

## 2016-09-27 NOTE — Telephone Encounter (Signed)
Appointments scheduled per 12/6 LOS. Patient given AVS report and calendars with future scheduled appointments. Patient aware of CT scan. Patient given two bottles of contrast and instructions.

## 2016-09-29 LAB — VITAMIN D 25 HYDROXY (VIT D DEFICIENCY, FRACTURES): VIT D 25 HYDROXY: 100 ng/mL (ref 30.0–100.0)

## 2016-10-04 ENCOUNTER — Encounter: Payer: Self-pay | Admitting: Internal Medicine

## 2016-10-04 ENCOUNTER — Other Ambulatory Visit: Payer: Self-pay | Admitting: Internal Medicine

## 2016-10-04 DIAGNOSIS — E2839 Other primary ovarian failure: Secondary | ICD-10-CM

## 2016-10-08 NOTE — Addendum Note (Signed)
Addended by: Josee Speece A on: 10/08/2016 04:32 PM   Modules accepted: Orders

## 2016-10-17 MED FILL — GILOTRIF 30 MG TABLET: 30 | 30 days supply | Qty: 30 | Fill #2

## 2016-10-26 ENCOUNTER — Other Ambulatory Visit: Payer: Self-pay | Admitting: Internal Medicine

## 2016-10-27 ENCOUNTER — Other Ambulatory Visit (HOSPITAL_BASED_OUTPATIENT_CLINIC_OR_DEPARTMENT_OTHER): Payer: Medicare Other

## 2016-10-27 ENCOUNTER — Ambulatory Visit (HOSPITAL_COMMUNITY)
Admission: RE | Admit: 2016-10-27 | Discharge: 2016-10-27 | Disposition: A | Discharge status: Home / Self Care | Payer: Medicare Other | Source: Ambulatory Visit | Attending: Internal Medicine | Admitting: Internal Medicine

## 2016-10-27 DIAGNOSIS — C3492 Malignant neoplasm of unspecified part of left bronchus or lung: Secondary | ICD-10-CM | POA: Insufficient documentation

## 2016-10-27 DIAGNOSIS — R918 Other nonspecific abnormal finding of lung field: Secondary | ICD-10-CM | POA: Insufficient documentation

## 2016-10-27 DIAGNOSIS — D1803 Hemangioma of intra-abdominal structures: Secondary | ICD-10-CM | POA: Diagnosis not present

## 2016-10-27 DIAGNOSIS — Z5111 Encounter for antineoplastic chemotherapy: Secondary | ICD-10-CM

## 2016-10-27 LAB — CBC WITH DIFFERENTIAL/PLATELET
BASO%: 1 % (ref 0.0–2.0)
BASOS ABS: 0.1 10*3/uL (ref 0.0–0.1)
EOS ABS: 0.2 10*3/uL (ref 0.0–0.5)
EOS%: 3.9 % (ref 0.0–7.0)
HEMATOCRIT: 39.1 % (ref 34.8–46.6)
HGB: 12.9 g/dL (ref 11.6–15.9)
LYMPH%: 28.5 % (ref 14.0–49.7)
MCH: 27.8 pg (ref 25.1–34.0)
MCHC: 33 g/dL (ref 31.5–36.0)
MCV: 84.3 fL (ref 79.5–101.0)
MONO#: 0.4 10*3/uL (ref 0.1–0.9)
MONO%: 8.5 % (ref 0.0–14.0)
NEUT#: 2.8 10*3/uL (ref 1.5–6.5)
NEUT%: 58.1 % (ref 38.4–76.8)
Platelets: 191 10*3/uL (ref 145–400)
RBC: 4.64 10*6/uL (ref 3.70–5.45)
RDW: 13.6 % (ref 11.2–14.5)
WBC: 4.8 10*3/uL (ref 3.9–10.3)
lymph#: 1.4 10*3/uL (ref 0.9–3.3)

## 2016-10-27 LAB — COMPREHENSIVE METABOLIC PANEL
ALT: 29 U/L (ref 0–55)
AST: 24 U/L (ref 5–34)
Albumin: 3.7 g/dL (ref 3.5–5.0)
Alkaline Phosphatase: 85 U/L (ref 40–150)
Anion Gap: 8 mEq/L (ref 3–11)
BUN: 17 mg/dL (ref 7.0–26.0)
CHLORIDE: 106 meq/L (ref 98–109)
CO2: 28 meq/L (ref 22–29)
CREATININE: 0.9 mg/dL (ref 0.6–1.1)
Calcium: 9.4 mg/dL (ref 8.4–10.4)
EGFR: 69 mL/min/{1.73_m2} — ABNORMAL LOW (ref >90)
Glucose: 78 mg/dl (ref 70–140)
Potassium: 4.1 mEq/L (ref 3.5–5.1)
Sodium: 142 mEq/L (ref 136–145)
Total Bilirubin: 1.33 mg/dL — ABNORMAL HIGH (ref 0.20–1.20)
Total Protein: 6.7 g/dL (ref 6.4–8.3)

## 2016-10-27 MED ORDER — IOPAMIDOL (ISOVUE-300) INJECTION 61%
INTRAVENOUS | Status: AC
  Filled 2016-10-27: qty 75

## 2016-10-27 MED ORDER — IOPAMIDOL (ISOVUE-300) INJECTION 61%
75.0000 mL | Freq: Once | INTRAVENOUS | Status: AC | PRN
  Administered 2016-10-27: 75 mL via INTRAVENOUS

## 2016-10-31 ENCOUNTER — Ambulatory Visit (HOSPITAL_BASED_OUTPATIENT_CLINIC_OR_DEPARTMENT_OTHER): Payer: Medicare Other | Admitting: Internal Medicine

## 2016-10-31 ENCOUNTER — Telehealth: Payer: Self-pay | Admitting: Internal Medicine

## 2016-10-31 ENCOUNTER — Encounter: Payer: Self-pay | Admitting: Internal Medicine

## 2016-10-31 VITALS — BP 136/65 | HR 73 | Temp 97.9°F | Resp 18 | Ht 60.0 in | Wt 148.2 lb

## 2016-10-31 DIAGNOSIS — I10 Essential (primary) hypertension: Secondary | ICD-10-CM

## 2016-10-31 DIAGNOSIS — L270 Generalized skin eruption due to drugs and medicaments taken internally: Secondary | ICD-10-CM

## 2016-10-31 DIAGNOSIS — E038 Other specified hypothyroidism: Secondary | ICD-10-CM

## 2016-10-31 DIAGNOSIS — C7802 Secondary malignant neoplasm of left lung: Secondary | ICD-10-CM | POA: Diagnosis not present

## 2016-10-31 DIAGNOSIS — C3492 Malignant neoplasm of unspecified part of left bronchus or lung: Secondary | ICD-10-CM

## 2016-10-31 DIAGNOSIS — R197 Diarrhea, unspecified: Secondary | ICD-10-CM

## 2016-10-31 DIAGNOSIS — C3432 Malignant neoplasm of lower lobe, left bronchus or lung: Secondary | ICD-10-CM | POA: Diagnosis not present

## 2016-10-31 DIAGNOSIS — C7801 Secondary malignant neoplasm of right lung: Secondary | ICD-10-CM | POA: Diagnosis not present

## 2016-10-31 DIAGNOSIS — Z5111 Encounter for antineoplastic chemotherapy: Secondary | ICD-10-CM

## 2016-10-31 NOTE — Telephone Encounter (Signed)
Appointments scheduled per 1/9 LOS. Patient given AVS report and calendars with future scheduled appointments. °

## 2016-10-31 NOTE — Progress Notes (Signed)
[facility] [phone]   Fax:(336) [place], MD [address] [place] [employer] [place] [address]  DIAGNOSIS: Stage IV (T2a, N0, M1a) non-small cell lung cancer, adenocarcinoma with positive EGFR mutation with deletion in exon 19 diagnosed in June 2016 and presented with large mass in the left lower lobe in addition to multiple bilateral pulmonary nodules  PRIOR THERAPY: Gilotrif 40 mg by mouth daily started 05/11/2015, status post 9 months of treatment discontinued on 02/17/2016 secondary to extensive skin rash.  CURRENT THERAPY: Gilotrif 30 mg by mouth daily started 02/21/2016 status post 8 months of treatment.  INTERVAL HISTORY: Elizabeth [name] 71 y.o. female returns to the clinic today for follow-up visit accompanied by her husband. The patient is feeling fine today with no specific complaints. Her skin rash has much improved after starting treatment with steroids. She also has few episodes of diarrhea. She denied having any significant weight loss or night sweats. She has no chest pain, shortness of breath, cough or hemoptysis. She denied having any fever or chills. The patient had repeat CT scan of the chest performed recently and she is here for evaluation and discussion of her scan results.  MEDICAL HISTORY: Past Medical History:  Diagnosis Date  . Adenocarcinoma of left lung, stage 4 ([place]) 04/05/2015   Biopsy confirmed CT SCAN: There are innumerable bilateral pulmonary nodules. These range in size from about 5 mm to 2 cm. They demonstrate irregular indistinct borders, the larger ones demonstrating spiculation. PET SCAN: Diffuse pulmonary metastatic disease with a 4 cm dominant left lower low lung lesion. No enlarged or hypermetabolic mediastinal or hilar adenopathy.  . Allergy   . Anxiety   . Cancer ([place])   . Drug-induced skin rash 01/17/2016  . Hyperlipidemia   . Hypertension   . Insomnia   .  Prediabetes   . Thyroid disease     ALLERGIES:  is allergic to penicillins.  MEDICATIONS:  Current Outpatient Prescriptions  Medication Sig Dispense Refill  . ALPRAZolam (XANAX) 1 MG tablet TAKE 1/2 TO 1 TABLET BY MOUTH THREE TIMES DAILY AS NEEDED FOR ANXIETY 270 tablet 0  . aspirin 81 MG tablet Take 81 mg by mouth at bedtime.     . Cholecalciferol (VITAMIN D3) 10000 UNITS TABS Take by mouth.    . clindamycin (CLEOCIN T) 1 % external solution Apply 1 application topically daily as needed. 30 mL 1  . Clotrimazole 1 % OINT Place a small amount of the external right lip for angular chelitis 56.7 g 0  . Cyanocobalamin (VITAMIN B 12 PO) Take 1 tablet by mouth daily.    Marland Kitchen GILOTRIF 30 MG tablet TAKE 1 TABLET BY MOUTH DAILY. TAKE ON AN EMPTY STOMACH 1 HOUR BEFORE OR 2 HOURS AFTER MEALS. 30 tablet 2  . hydrocortisone cream 0.5 % Apply 1 application topically 2 (two) times daily.    Marland Kitchen ipratropium (ATROVENT) 0.03 % nasal spray Place 2 sprays into the nose 3 (three) times daily. 30 mL 2  . levothyroxine (SYNTHROID, LEVOTHROID) 50 MCG tablet TAKE 1 TABLET BY MOUTH EVERY DAY 90 tablet 0  . magnesium 30 MG tablet Take 30 mg by mouth 2 (two) times daily.    . montelukast (SINGULAIR) 10 MG tablet TAKE 1 TABLET BY MOUTH EVERY DAY 90 tablet 3  . mupirocin ointment (BACTROBAN) 2 % Place 1 application into the nose 2 (two) times daily. 22 g 0  . prednisoLONE (PRELONE)  15 MG/5ML SOLN Please use enough to apply topically to the scalp for itching 450 mL 0  . Triprolidine-Pseudoephedrine (ANTIHISTAMINE PO) Take by mouth. Reported on 04/26/2016    . HYDROcodone-homatropine (HYCODAN) 5-1.5 MG/5ML syrup Take 5 mLs by mouth every 6 (six) hours as needed for cough. (Patient not taking: Reported on 10/31/2016) 120 mL 0   No current facility-administered medications for this visit.     SURGICAL HISTORY:  Past Surgical History:  Procedure Laterality Date  . ABDOMINAL HYSTERECTOMY  1995   w BSO  . TONSILLECTOMY AND  ADENOIDECTOMY      REVIEW OF SYSTEMS:  Constitutional: negative Eyes: negative Ears, nose, mouth, throat, and face: negative Respiratory: negative Cardiovascular: negative Gastrointestinal: positive for diarrhea Genitourinary:negative Integument/breast: negative Hematologic/lymphatic: negative Musculoskeletal:negative Neurological: negative Behavioral/Psych: negative Endocrine: negative Allergic/Immunologic: negative   PHYSICAL EXAMINATION: General appearance: alert, cooperative and no distress Head: Normocephalic, without obvious abnormality, atraumatic Neck: no adenopathy, no JVD, supple, symmetrical, trachea midline and thyroid not enlarged, symmetric, no tenderness/mass/nodules Lymph nodes: Cervical, supraclavicular, and axillary nodes normal. Resp: clear to auscultation bilaterally Back: symmetric, no curvature. ROM normal. No CVA tenderness. Cardio: regular rate and rhythm, S1, S2 normal, no murmur, click, rub or gallop GI: soft, non-tender; bowel sounds normal; no masses,  no organomegaly Extremities: extremities normal, atraumatic, no cyanosis or edema Neurologic: Alert and oriented X 3, normal strength and tone. Normal symmetric reflexes. Normal coordination and gait  ECOG PERFORMANCE STATUS: 0 - Asymptomatic  Blood pressure 136/65, pulse 73, temperature 97.9 F (36.6 C), temperature source Oral, resp. rate 18, height 5' (1.524 m), weight 148 lb 3.2 oz (67.2 kg), SpO2 100 %.  LABORATORY DATA: Lab Results  Component Value Date   WBC 4.8 10/27/2016   HGB 12.9 10/27/2016   HCT 39.1 10/27/2016   MCV 84.3 10/27/2016   PLT 191 10/27/2016      Chemistry      Component Value Date/Time   NA 142 10/27/2016 1055   K 4.1 10/27/2016 1055   CL 105 05/23/2016 1234   CO2 28 10/27/2016 1055   BUN 17.0 10/27/2016 1055   CREATININE 0.9 10/27/2016 1055      Component Value Date/Time   CALCIUM 9.4 10/27/2016 1055   ALKPHOS 85 10/27/2016 1055   AST 24 10/27/2016 1055    ALT 29 10/27/2016 1055   BILITOT 1.33 (H) 10/27/2016 1055       RADIOGRAPHIC STUDIES: Ct Chest W Contrast  Result Date: 10/27/2016 CLINICAL DATA:  Restaging lung cancer. EXAM: CT CHEST WITH CONTRAST TECHNIQUE: Multidetector CT imaging of the chest was performed during intravenous contrast administration. CONTRAST:  60m ISOVUE-300 IOPAMIDOL (ISOVUE-300) INJECTION 61% COMPARISON:  Chest CT 07/28/2016 FINDINGS: Chest wall: No breast masses, supraclavicular or axillary lymphadenopathy. The thyroid gland appears normal. Moderate pectus deformity with mass effect on the right ventricle. Cardiovascular: The heart is normal in size. No pericardial effusion. The aorta is normal in caliber. No dissection. The branch vessels are patent. No significant coronary artery calcifications. Mediastinum/Nodes: Small scattered mediastinal and hilar lymph nodes but no mass or overt adenopathy. The esophagus is grossly normal. Lungs/Pleura: Multiple small stable ill-defined pulmonary nodules consistent with treated disease. No new or enlarging pulmonary lesions. 13.5 mm lesion in the superior segment of the right lower lobe is stable. Persistent area of dense airspace consolidation and bronchiectasis in the left lower lobe likely treated tumor and possible radiation change. No findings suspicious for recurrent tumor based on prior CTs. Upper Abdomen: Stable segment 7 3.4 cm  hemangioma. No findings suspicious for hepatic or adrenal gland metastasis. Musculoskeletal: No significant bony findings. IMPRESSION: Stable CT appearance of the chest when compared to multiple prior studies. Treated pulmonary metastatic disease with vague scattered nodular densities. No findings suspicious for new or progressive/ recurrent metastatic disease. Stable small scattered mediastinal and hilar lymph nodes but no mass or adenopathy. Stable right hepatic lobe hemangioma. No findings for upper abdominal metastatic disease. Electronically Signed   By:  Marijo Sanes M.D.   On: 10/27/2016 13:50    ASSESSMENT AND PLAN: This is a very pleasant 71 years old white female with a stage IV non-small cell lung cancer, adenocarcinoma with positive EGFR mutation with deletion in exon 19. She was initially treated with 40 mg by mouth daily for 9 months discontinued secondary to significant skin rash. She is currently on Gilotrif 30 mg by mouth daily for the last 8 months and tolerating her treatment well. The patient had repeat CT scan of the chest performed recently. I personally and independently reviewed the scan images and discuss the results with the patient and her husband. Her scan showed no clear evidence for disease progression. I recommended for the patient to continue her current treatment with Gilotrif 30 mg by mouth daily. For the skin rash, she will continue to apply hydrocortisone cream. For the diarrhea, she will continue on Imodium on as-needed basis. For the hypothyroidism, the patient will continue levothyroxine 50 g by mouth daily. The patient would come back for follow-up visit in one month's for reevaluation with repeat CBC and comprehensive metabolic panel. She was advised to call immediately if she has any concerning symptoms in the interval. The patient voices understanding of current disease status and treatment options and is in agreement with the current care plan.  All questions were answered. The patient knows to call the clinic with any problems, questions or concerns. We can certainly see the patient much sooner if necessary.  Disclaimer: This note was dictated with voice recognition software. Similar sounding words can inadvertently be transcribed and may not be corrected upon review.

## 2016-11-14 ENCOUNTER — Other Ambulatory Visit: Payer: Self-pay | Admitting: Internal Medicine

## 2016-11-14 MED FILL — GILOTRIF 30 MG TABLET: 30 | 30 days supply | Qty: 30 | Fill #0

## 2016-11-27 ENCOUNTER — Other Ambulatory Visit: Payer: Self-pay | Admitting: Physician Assistant

## 2016-11-28 ENCOUNTER — Encounter: Payer: Self-pay | Admitting: Internal Medicine

## 2016-11-28 ENCOUNTER — Ambulatory Visit (HOSPITAL_BASED_OUTPATIENT_CLINIC_OR_DEPARTMENT_OTHER): Payer: Medicare Other | Admitting: Internal Medicine

## 2016-11-28 ENCOUNTER — Other Ambulatory Visit (HOSPITAL_BASED_OUTPATIENT_CLINIC_OR_DEPARTMENT_OTHER): Payer: Medicare Other

## 2016-11-28 ENCOUNTER — Telehealth: Payer: Self-pay | Admitting: Internal Medicine

## 2016-11-28 VITALS — BP 138/81 | HR 91 | Temp 97.6°F | Resp 18 | Ht 60.0 in | Wt 148.5 lb

## 2016-11-28 DIAGNOSIS — Z5111 Encounter for antineoplastic chemotherapy: Secondary | ICD-10-CM

## 2016-11-28 DIAGNOSIS — C3492 Malignant neoplasm of unspecified part of left bronchus or lung: Secondary | ICD-10-CM

## 2016-11-28 DIAGNOSIS — C7800 Secondary malignant neoplasm of unspecified lung: Secondary | ICD-10-CM

## 2016-11-28 DIAGNOSIS — I10 Essential (primary) hypertension: Secondary | ICD-10-CM

## 2016-11-28 DIAGNOSIS — C3432 Malignant neoplasm of lower lobe, left bronchus or lung: Secondary | ICD-10-CM | POA: Diagnosis not present

## 2016-11-28 DIAGNOSIS — L270 Generalized skin eruption due to drugs and medicaments taken internally: Secondary | ICD-10-CM

## 2016-11-28 DIAGNOSIS — E038 Other specified hypothyroidism: Secondary | ICD-10-CM

## 2016-11-28 LAB — CBC WITH DIFFERENTIAL/PLATELET
BASO%: 0.3 % (ref 0.0–2.0)
BASOS ABS: 0 10*3/uL (ref 0.0–0.1)
EOS%: 1.8 % (ref 0.0–7.0)
Eosinophils Absolute: 0.2 10*3/uL (ref 0.0–0.5)
HEMATOCRIT: 40 % (ref 34.8–46.6)
HGB: 12.9 g/dL (ref 11.6–15.9)
LYMPH%: 14.9 % (ref 14.0–49.7)
MCH: 28 pg (ref 25.1–34.0)
MCHC: 32.3 g/dL (ref 31.5–36.0)
MCV: 86.8 fL (ref 79.5–101.0)
MONO#: 0.8 10*3/uL (ref 0.1–0.9)
MONO%: 7.9 % (ref 0.0–14.0)
NEUT#: 7.2 10*3/uL — ABNORMAL HIGH (ref 1.5–6.5)
NEUT%: 75.1 % (ref 38.4–76.8)
Platelets: 189 10*3/uL (ref 145–400)
RBC: 4.61 10*6/uL (ref 3.70–5.45)
RDW: 13 % (ref 11.2–14.5)
WBC: 9.6 10*3/uL (ref 3.9–10.3)
lymph#: 1.4 10*3/uL (ref 0.9–3.3)

## 2016-11-28 LAB — COMPREHENSIVE METABOLIC PANEL
ALT: 25 U/L (ref 0–55)
AST: 20 U/L (ref 5–34)
Albumin: 3.4 g/dL — ABNORMAL LOW (ref 3.5–5.0)
Alkaline Phosphatase: 89 U/L (ref 40–150)
Anion Gap: 7 mEq/L (ref 3–11)
BUN: 15 mg/dL (ref 7.0–26.0)
CALCIUM: 9.2 mg/dL (ref 8.4–10.4)
CHLORIDE: 107 meq/L (ref 98–109)
CO2: 28 mEq/L (ref 22–29)
Creatinine: 0.9 mg/dL (ref 0.6–1.1)
EGFR: 66 mL/min/{1.73_m2} — AB (ref >90)
Glucose: 78 mg/dl (ref 70–140)
POTASSIUM: 4.5 meq/L (ref 3.5–5.1)
SODIUM: 142 meq/L (ref 136–145)
Total Bilirubin: 0.79 mg/dL (ref 0.20–1.20)
Total Protein: 6.8 g/dL (ref 6.4–8.3)

## 2016-11-28 NOTE — Telephone Encounter (Signed)
Appointments scheduled per 2/6 LOS. Patient given AVS report and calendars with future scheduled appointments. °

## 2016-11-28 NOTE — Progress Notes (Signed)
Elizabeth [name]   Fax:(336) [place], MD [address] Elizabeth [name] [address]  DIAGNOSIS: Stage IV (T2a, N0, M1a) non-small cell lung cancer, adenocarcinoma with positive EGFR mutation with deletion in exon 19 diagnosed in June 2016 and presented with large mass in the left lower lobe in addition to multiple bilateral pulmonary nodules  PRIOR THERAPY: Gilotrif 40 mg by mouth daily started 05/11/2015, status post 9 months of treatment discontinued on 02/17/2016 secondary to extensive skin rash.  CURRENT THERAPY: Gilotrif 30 mg by mouth daily started 02/21/2016 status post 8 months of treatment.  INTERVAL HISTORY: Elizabeth [name] 71 y.o. female came to the clinic today for follow-up visit. The patient is feeling fine with no specific complaints except for the scab in her scalp in addition to fingertips skin breaks. She was seen by dermatology in November and was given prescription for topical shampoos and creams. She denied having any recent weight loss or night sweats. She has no nausea, vomiting, diarrhea or constipation. She has no chest pain, shortness of breath, cough or hemoptysis. She is here today for evaluation and repeat blood work.  MEDICAL HISTORY: Past Medical History:  Diagnosis Date  . Adenocarcinoma of left lung, stage 4 ([place]) 04/05/2015   Biopsy confirmed CT SCAN: There are innumerable bilateral pulmonary nodules. These range in size from about 5 mm to 2 cm. They demonstrate irregular indistinct borders, the larger ones demonstrating spiculation. PET SCAN: Diffuse pulmonary metastatic disease with a 4 cm dominant left lower low lung lesion. No enlarged or hypermetabolic mediastinal or hilar adenopathy.  . Allergy   . Anxiety   . Cancer ([place])   . Drug-induced skin rash 01/17/2016  . Hyperlipidemia   . Hypertension   . Insomnia   . Prediabetes   . Thyroid disease      ALLERGIES:  is allergic to penicillins.  MEDICATIONS:  Current Outpatient Prescriptions  Medication Sig Dispense Refill  . ALPRAZolam (XANAX) 1 MG tablet TAKE 1/2 TO 1 TABLET BY MOUTH THREE TIMES DAILY AS NEEDED FOR ANXIETY 270 tablet 0  . aspirin 81 MG tablet Take 81 mg by mouth at bedtime.     . Cholecalciferol (VITAMIN D3) 10000 UNITS TABS Take by mouth.    . clindamycin (CLEOCIN T) 1 % external solution Apply 1 application topically daily as needed. 30 mL 1  . Clotrimazole 1 % OINT Place a small amount of the external right lip for angular chelitis 56.7 g 0  . Cyanocobalamin (VITAMIN B 12 PO) Take 1 tablet by mouth daily.    Marland Kitchen GILOTRIF 30 MG tablet TAKE 1 TABLET BY MOUTH ONCE DAILY. TAKE ON AN EMPTY STOMACH 1 HOUR BEFORE OR 2 HOURS AFTER MEALS 30 tablet 2  . hydrocortisone cream 0.5 % Apply 1 application topically 2 (two) times daily.    Marland Kitchen ipratropium (ATROVENT) 0.03 % nasal spray Place 2 sprays into the nose 3 (three) times daily. 30 mL 2  . levothyroxine (SYNTHROID, LEVOTHROID) 50 MCG tablet TAKE 1 TABLET BY MOUTH EVERY DAY 90 tablet 1  . magnesium 30 MG tablet Take 30 mg by mouth 2 (two) times daily.    . montelukast (SINGULAIR) 10 MG tablet TAKE 1 TABLET BY MOUTH EVERY DAY 90 tablet 3  . mupirocin ointment (BACTROBAN) 2 % Place 1 application into the nose 2 (two) times daily. 22 g 0  . prednisoLONE (PRELONE) 15 MG/5ML SOLN Please use  enough to apply topically to the scalp for itching 450 mL 0  . Triprolidine-Pseudoephedrine (ANTIHISTAMINE PO) Take by mouth. Reported on 04/26/2016    . HYDROcodone-homatropine (HYCODAN) 5-1.5 MG/5ML syrup Take 5 mLs by mouth every 6 (six) hours as needed for cough. (Patient not taking: Reported on 10/31/2016) 120 mL 0   No current facility-administered medications for this visit.     SURGICAL HISTORY:  Past Surgical History:  Procedure Laterality Date  . ABDOMINAL HYSTERECTOMY  1995   w BSO  . TONSILLECTOMY AND ADENOIDECTOMY      REVIEW OF  SYSTEMS:  A comprehensive review of systems was negative except for: Integument/breast: positive for dryness and skin lesion(s)   PHYSICAL EXAMINATION: General appearance: alert, cooperative and no distress Head: Normocephalic, without obvious abnormality, atraumatic Neck: no adenopathy, no JVD, supple, symmetrical, trachea midline and thyroid not enlarged, symmetric, no tenderness/mass/nodules Lymph nodes: Cervical, supraclavicular, and axillary nodes normal. Resp: clear to auscultation bilaterally Back: symmetric, no curvature. ROM normal. No CVA tenderness. Cardio: regular rate and rhythm, S1, S2 normal, no murmur, click, rub or gallop GI: soft, non-tender; bowel sounds normal; no masses,  no organomegaly Extremities: extremities normal, atraumatic, no cyanosis or edema  ECOG PERFORMANCE STATUS: 0 - Asymptomatic  Blood pressure 138/81, pulse 91, temperature 97.6 F (36.4 C), temperature source Oral, resp. rate 18, height 5' (1.524 m), weight 148 lb 8 oz (67.4 kg), SpO2 100 %.  LABORATORY DATA: Lab Results  Component Value Date   WBC 9.6 11/28/2016   HGB 12.9 11/28/2016   HCT 40.0 11/28/2016   MCV 86.8 11/28/2016   PLT 189 11/28/2016      Chemistry      Component Value Date/Time   NA 142 10/27/2016 1055   K 4.1 10/27/2016 1055   CL 105 05/23/2016 1234   CO2 28 10/27/2016 1055   BUN 17.0 10/27/2016 1055   CREATININE 0.9 10/27/2016 1055      Component Value Date/Time   CALCIUM 9.4 10/27/2016 1055   ALKPHOS 85 10/27/2016 1055   AST 24 10/27/2016 1055   ALT 29 10/27/2016 1055   BILITOT 1.33 (H) 10/27/2016 1055       RADIOGRAPHIC STUDIES: No results found.  ASSESSMENT AND PLAN:  This is a very pleasant 71 years old white female with a stage IV non-small cell lung cancer, adenocarcinoma with positive EGFR mutation with deletion in exon 19. She status post treatment with Gilotrif 40 mg by mouth daily for 9 months and her dose was reduced to 30 mg by mouth daily secondary  to intolerance and skin rash, status post 9 months. She is feeling fine today except for the the scalp lesions. I recommended for her to see her dermatologist again for evaluation of these lesions. I recommended for the patient to continue her current treatment with Gilotrif 30 mg by mouth daily. I gave her the option of reducing the dose to 20 mg by mouth daily but she declined this option. I will see her back for follow-up visit in one month for evaluation and repeat blood work. She was advised to call immediately if she has any concerning symptoms in the interval. The patient voices understanding of current disease status and treatment options and is in agreement with the current care plan.  All questions were answered. The patient knows to call the clinic with any problems, questions or concerns. We can certainly see the patient much sooner if necessary. I spent 10 minutes counseling the patient face to face. The  total time spent in the appointment was 15 minutes.  Disclaimer: This note was dictated with voice recognition software. Similar sounding words can inadvertently be transcribed and may not be corrected upon review.

## 2016-12-05 MED FILL — SULFAMETHOXAZOLE/TMP DS TAB: 800-160 | 14 days supply | Qty: 28 | Fill #0

## 2016-12-05 MED FILL — FLUOCINONIDE 0.05% SOLUTION: 0.05 | 30 days supply | Qty: 60 | Fill #0

## 2016-12-17 MED FILL — IPRATROPIUM 0.03% SPRAY: 0.03 | 30 days supply | Qty: 30 | Fill #1

## 2016-12-18 ENCOUNTER — Other Ambulatory Visit: Payer: Self-pay | Admitting: Internal Medicine

## 2016-12-18 MED FILL — GILOTRIF 30 MG TABLET: 30 | 30 days supply | Qty: 30 | Fill #1

## 2016-12-18 MED FILL — MUPIROCIN 2% OINTMENT: 2 | 10 days supply | Qty: 22 | Fill #0

## 2016-12-28 ENCOUNTER — Ambulatory Visit (HOSPITAL_BASED_OUTPATIENT_CLINIC_OR_DEPARTMENT_OTHER): Payer: Medicare Other | Admitting: Internal Medicine

## 2016-12-28 ENCOUNTER — Other Ambulatory Visit (HOSPITAL_BASED_OUTPATIENT_CLINIC_OR_DEPARTMENT_OTHER): Payer: Medicare Other

## 2016-12-28 ENCOUNTER — Telehealth: Payer: Self-pay | Admitting: Internal Medicine

## 2016-12-28 VITALS — BP 138/80 | HR 69 | Temp 97.5°F | Resp 16 | Wt 151.5 lb

## 2016-12-28 DIAGNOSIS — L270 Generalized skin eruption due to drugs and medicaments taken internally: Secondary | ICD-10-CM

## 2016-12-28 DIAGNOSIS — C7802 Secondary malignant neoplasm of left lung: Secondary | ICD-10-CM | POA: Diagnosis not present

## 2016-12-28 DIAGNOSIS — Z5111 Encounter for antineoplastic chemotherapy: Secondary | ICD-10-CM

## 2016-12-28 DIAGNOSIS — C7801 Secondary malignant neoplasm of right lung: Secondary | ICD-10-CM | POA: Diagnosis not present

## 2016-12-28 DIAGNOSIS — C3492 Malignant neoplasm of unspecified part of left bronchus or lung: Secondary | ICD-10-CM

## 2016-12-28 DIAGNOSIS — C3432 Malignant neoplasm of lower lobe, left bronchus or lung: Secondary | ICD-10-CM | POA: Diagnosis not present

## 2016-12-28 LAB — CBC WITH DIFFERENTIAL/PLATELET
BASO%: 0.8 % (ref 0.0–2.0)
BASOS ABS: 0 10*3/uL (ref 0.0–0.1)
EOS%: 4.6 % (ref 0.0–7.0)
Eosinophils Absolute: 0.2 10*3/uL (ref 0.0–0.5)
HEMATOCRIT: 37.7 % (ref 34.8–46.6)
HGB: 12.3 g/dL (ref 11.6–15.9)
LYMPH#: 1.3 10*3/uL (ref 0.9–3.3)
LYMPH%: 24.5 % (ref 14.0–49.7)
MCH: 28 pg (ref 25.1–34.0)
MCHC: 32.6 g/dL (ref 31.5–36.0)
MCV: 85.7 fL (ref 79.5–101.0)
MONO#: 0.5 10*3/uL (ref 0.1–0.9)
MONO%: 10.1 % (ref 0.0–14.0)
NEUT%: 60 % (ref 38.4–76.8)
NEUTROS ABS: 3.2 10*3/uL (ref 1.5–6.5)
Platelets: 161 10*3/uL (ref 145–400)
RBC: 4.4 10*6/uL (ref 3.70–5.45)
RDW: 13.3 % (ref 11.2–14.5)
WBC: 5.3 10*3/uL (ref 3.9–10.3)

## 2016-12-28 LAB — COMPREHENSIVE METABOLIC PANEL
ALBUMIN: 3.5 g/dL (ref 3.5–5.0)
ALK PHOS: 80 U/L (ref 40–150)
ALT: 42 U/L (ref 0–55)
AST: 28 U/L (ref 5–34)
Anion Gap: 8 mEq/L (ref 3–11)
BUN: 15.9 mg/dL (ref 7.0–26.0)
CALCIUM: 9.2 mg/dL (ref 8.4–10.4)
CO2: 29 mEq/L (ref 22–29)
CREATININE: 0.9 mg/dL (ref 0.6–1.1)
Chloride: 106 mEq/L (ref 98–109)
EGFR: 63 mL/min/{1.73_m2} — ABNORMAL LOW (ref >90)
GLUCOSE: 86 mg/dL (ref 70–140)
POTASSIUM: 4.5 meq/L (ref 3.5–5.1)
SODIUM: 142 meq/L (ref 136–145)
Total Bilirubin: 1.02 mg/dL (ref 0.20–1.20)
Total Protein: 6.6 g/dL (ref 6.4–8.3)

## 2016-12-28 NOTE — Telephone Encounter (Signed)
Gave patient AVS and calender per 12/28/2016 los. [facility] to contact patient with CT schedule. Patient is aware .

## 2016-12-28 NOTE — Progress Notes (Signed)
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  CURRENT THERAPY: Gilotrif 30 mg by mouth daily started 02/21/2016 status post 9 months of treatment.  INTERVAL HISTORY: Elizabeth [name] 71 y.o. female came to the clinic today for follow-up visit accompanied by her husband.The patient is feeling fine today with no specific complaints. The skin rash is better after she would was seen by dermatology and was given topical lotions for her scalp lesions. She is currently on Bactroban, Clotrimazole as well as clindamycin lotion. She denied having any diarrhea. She has no chest pain, shortness of breath, cough or hemoptysis.she has no nausea or vomiting. She denied having any weight loss or night sweats. She is here today for evaluation and repeat blood work.   MEDICAL HISTORY: Past Medical History:  Diagnosis Date  . Adenocarcinoma of left lung, stage 4 ([place]) 04/05/2015   Biopsy confirmed CT SCAN: There are innumerable bilateral pulmonary nodules. These range in size from about 5 mm to 2 cm. They demonstrate irregular indistinct borders, the larger ones demonstrating spiculation. PET SCAN: Diffuse pulmonary metastatic disease with a 4 cm dominant left lower low lung lesion. No enlarged or hypermetabolic mediastinal or hilar adenopathy.  . Allergy   . Anxiety   . Cancer ([place])   . Drug-induced skin rash 01/17/2016  . Hyperlipidemia   . Hypertension   .  Insomnia   . Prediabetes   . Thyroid disease     ALLERGIES:  is allergic to penicillins.  MEDICATIONS:  Current Outpatient Prescriptions  Medication Sig Dispense Refill  . ALPRAZolam (XANAX) 1 MG tablet TAKE 1/2 TO 1 TABLET BY MOUTH THREE TIMES DAILY AS NEEDED FOR ANXIETY 270 tablet 0  . aspirin 81 MG tablet Take 81 mg by mouth at bedtime.     . Cholecalciferol (VITAMIN D3) 10000 UNITS TABS Take by mouth.    . clindamycin (CLEOCIN T) 1 % external solution Apply 1 application topically daily as needed. 30 mL 1  . Clotrimazole 1 % OINT Place a small amount of the external right lip for angular chelitis 56.7 g 0  . Cyanocobalamin (VITAMIN B 12 PO) Take 1 tablet by mouth daily.    Marland Kitchen GILOTRIF 30 MG tablet TAKE 1 TABLET BY MOUTH ONCE DAILY. TAKE ON AN EMPTY STOMACH 1 HOUR BEFORE OR 2 HOURS AFTER MEALS 30 tablet 2  . HYDROcodone-homatropine (HYCODAN) 5-1.5 MG/5ML syrup Take 5 mLs by mouth every 6 (six) hours as needed for cough. (Patient not taking: Reported on 10/31/2016) 120 mL 0  . hydrocortisone cream 0.5 % Apply 1 application topically 2 (two) times daily.    Marland Kitchen ipratropium (ATROVENT) 0.03 % nasal spray Place 2 sprays into the nose 3 (three) times daily. 30 mL 2  . levothyroxine (SYNTHROID, LEVOTHROID) 50 MCG tablet TAKE 1 TABLET BY MOUTH EVERY DAY 90 tablet 1  . magnesium 30 MG tablet Take 30 mg by mouth 2 (two) times daily.    . montelukast (SINGULAIR) 10 MG tablet TAKE  1 TABLET BY MOUTH EVERY DAY 90 tablet 3  . mupirocin ointment (BACTROBAN) 2 % PLACE 1 APPLICATION INTO THE NOSE 2 (TWO) TIMES DAILY. 22 g 0  . prednisoLONE (PRELONE) 15 MG/5ML SOLN Please use enough to apply topically to the scalp for itching 450 mL 0  . Triprolidine-Pseudoephedrine (ANTIHISTAMINE PO) Take by mouth. Reported on 04/26/2016     No current facility-administered medications for this visit.     SURGICAL HISTORY:  Past Surgical History:  Procedure Laterality Date  . ABDOMINAL HYSTERECTOMY  1995   w BSO  .  TONSILLECTOMY AND ADENOIDECTOMY      REVIEW OF SYSTEMS:  A comprehensive review of systems was negative except for: Integument/breast: positive for skin lesion(s)   PHYSICAL EXAMINATION: General appearance: alert, cooperative and no distress Head: Normocephalic, without obvious abnormality, atraumatic Neck: no adenopathy, no JVD, supple, symmetrical, trachea midline and thyroid not enlarged, symmetric, no tenderness/mass/nodules Lymph nodes: Cervical, supraclavicular, and axillary nodes normal. Resp: clear to auscultation bilaterally Back: symmetric, no curvature. ROM normal. No CVA tenderness. Cardio: regular rate and rhythm, S1, S2 normal, no murmur, click, rub or gallop GI: soft, non-tender; bowel sounds normal; no masses,  no organomegaly Extremities: extremities normal, atraumatic, no cyanosis or edema  ECOG PERFORMANCE STATUS: 0 - Asymptomatic  Blood pressure 138/80, pulse 69, temperature 97.5 F (36.4 C), temperature source Oral, resp. rate 16, weight 151 lb 8 oz (68.7 kg), SpO2 100 %.  LABORATORY DATA: Lab Results  Component Value Date   WBC 5.3 12/28/2016   HGB 12.3 12/28/2016   HCT 37.7 12/28/2016   MCV 85.7 12/28/2016   PLT 161 12/28/2016      Chemistry      Component Value Date/Time   NA 142 12/28/2016 1109   K 4.5 12/28/2016 1109   CL 105 05/23/2016 1234   CO2 29 12/28/2016 1109   BUN 15.9 12/28/2016 1109   CREATININE 0.9 12/28/2016 1109      Component Value Date/Time   CALCIUM 9.2 12/28/2016 1109   ALKPHOS 80 12/28/2016 1109   AST 28 12/28/2016 1109   ALT 42 12/28/2016 1109   BILITOT 1.02 12/28/2016 1109       RADIOGRAPHIC STUDIES: No results found.  ASSESSMENT AND PLAN:  This is a very pleasant 71 years old white female with a stage IV non-small cell lung cancer, carcinomawith positive EGFR mutation with deletion in exon 19 status post treatment with Gilotrif 40 mg by mouth daily for 9 months and she is currently on a reduced dose Gilotrif 30 mg by  mouth daily status post 9 months. She is tolerating the treatment well with no significant complaints except for the skin rash. I recommended for the patient to continue her current treatment with Gilotrif with the same dose. I will see her back for follow-up visit in one month's for evaluation after repeating CT scan of the chest for restaging of her disease. She was advised to call immediately if she has any concerning symptoms in the interval. The patient voices understanding of current disease status and treatment options and is in agreement with the current care plan.  All questions were answered. The patient knows to call the clinic with any problems, questions or concerns. We can certainly see the patient much sooner if necessary. I spent 10 minutes counseling the patient face to face. The total time spent in the appointment was 15 minutes.  Disclaimer: This note was dictated with voice recognition software. Similar sounding words can inadvertently be transcribed  and may not be corrected upon review.

## 2016-12-30 ENCOUNTER — Encounter: Payer: Self-pay | Admitting: Internal Medicine

## 2017-01-04 MED FILL — FLUOCINONIDE 0.05% SOLUTION: 0.05 | 30 days supply | Qty: 60 | Fill #1

## 2017-01-16 MED FILL — GILOTRIF 30 MG TABLET: 30 | 30 days supply | Qty: 30 | Fill #2

## 2017-01-18 MED FILL — SULFAMETHOXAZOLE/TMP DS TAB: 800-160 | 14 days supply | Qty: 28 | Fill #0

## 2017-01-24 ENCOUNTER — Encounter: Payer: Self-pay | Admitting: *Deleted

## 2017-01-26 ENCOUNTER — Ambulatory Visit (HOSPITAL_COMMUNITY)
Admission: RE | Admit: 2017-01-26 | Discharge: 2017-01-26 | Disposition: A | Discharge status: Home / Self Care | Payer: Medicare Other | Source: Ambulatory Visit | Attending: Internal Medicine | Admitting: Internal Medicine

## 2017-01-26 ENCOUNTER — Other Ambulatory Visit (HOSPITAL_BASED_OUTPATIENT_CLINIC_OR_DEPARTMENT_OTHER): Payer: Medicare Other

## 2017-01-26 DIAGNOSIS — L270 Generalized skin eruption due to drugs and medicaments taken internally: Secondary | ICD-10-CM | POA: Insufficient documentation

## 2017-01-26 DIAGNOSIS — C3432 Malignant neoplasm of lower lobe, left bronchus or lung: Secondary | ICD-10-CM | POA: Diagnosis not present

## 2017-01-26 DIAGNOSIS — C3492 Malignant neoplasm of unspecified part of left bronchus or lung: Secondary | ICD-10-CM

## 2017-01-26 DIAGNOSIS — Z9221 Personal history of antineoplastic chemotherapy: Secondary | ICD-10-CM | POA: Diagnosis not present

## 2017-01-26 DIAGNOSIS — Z5111 Encounter for antineoplastic chemotherapy: Secondary | ICD-10-CM

## 2017-01-26 LAB — CBC WITH DIFFERENTIAL/PLATELET
BASO%: 1 % (ref 0.0–2.0)
Basophils Absolute: 0.1 10*3/uL (ref 0.0–0.1)
EOS%: 2.9 % (ref 0.0–7.0)
Eosinophils Absolute: 0.2 10*3/uL (ref 0.0–0.5)
HCT: 38.1 % (ref 34.8–46.6)
HGB: 12.7 g/dL (ref 11.6–15.9)
LYMPH%: 21.7 % (ref 14.0–49.7)
MCH: 28 pg (ref 25.1–34.0)
MCHC: 33.5 g/dL (ref 31.5–36.0)
MCV: 83.8 fL (ref 79.5–101.0)
MONO#: 0.6 10*3/uL (ref 0.1–0.9)
MONO%: 9.2 % (ref 0.0–14.0)
NEUT#: 4.1 10*3/uL (ref 1.5–6.5)
NEUT%: 65.2 % (ref 38.4–76.8)
Platelets: 195 10*3/uL (ref 145–400)
RBC: 4.54 10*6/uL (ref 3.70–5.45)
RDW: 13.8 % (ref 11.2–14.5)
WBC: 6.2 10*3/uL (ref 3.9–10.3)
lymph#: 1.3 10*3/uL (ref 0.9–3.3)

## 2017-01-26 LAB — COMPREHENSIVE METABOLIC PANEL
ALT: 26 U/L (ref 0–55)
AST: 22 U/L (ref 5–34)
Albumin: 3.6 g/dL (ref 3.5–5.0)
Alkaline Phosphatase: 79 U/L (ref 40–150)
Anion Gap: 8 mEq/L (ref 3–11)
BUN: 16.8 mg/dL (ref 7.0–26.0)
CHLORIDE: 107 meq/L (ref 98–109)
CO2: 24 meq/L (ref 22–29)
CREATININE: 1.1 mg/dL (ref 0.6–1.1)
Calcium: 9.2 mg/dL (ref 8.4–10.4)
EGFR: 51 mL/min/{1.73_m2} — ABNORMAL LOW (ref >90)
Glucose: 82 mg/dl (ref 70–140)
Potassium: 5 mEq/L (ref 3.5–5.1)
Sodium: 139 mEq/L (ref 136–145)
Total Bilirubin: 0.7 mg/dL (ref 0.20–1.20)
Total Protein: 6.8 g/dL (ref 6.4–8.3)

## 2017-01-26 MED ORDER — IOPAMIDOL (ISOVUE-300) INJECTION 61%
INTRAVENOUS | Status: AC
  Filled 2017-01-26: qty 75

## 2017-01-26 MED ORDER — IOPAMIDOL (ISOVUE-300) INJECTION 61%
75.0000 mL | Freq: Once | INTRAVENOUS | Status: AC | PRN
  Administered 2017-01-26: 75 mL via INTRAVENOUS

## 2017-01-29 ENCOUNTER — Encounter: Payer: Self-pay | Admitting: Internal Medicine

## 2017-01-29 MED FILL — CLOBETASOL 0.05% SOLUTION: 0.05 | 20 days supply | Qty: 50 | Fill #0

## 2017-02-01 ENCOUNTER — Encounter: Payer: Self-pay | Admitting: Internal Medicine

## 2017-02-01 ENCOUNTER — Ambulatory Visit (HOSPITAL_BASED_OUTPATIENT_CLINIC_OR_DEPARTMENT_OTHER): Payer: Medicare Other | Admitting: Internal Medicine

## 2017-02-01 ENCOUNTER — Telehealth: Payer: Self-pay | Admitting: Internal Medicine

## 2017-02-01 VITALS — BP 133/75 | HR 71 | Temp 97.7°F | Resp 18 | Ht 60.0 in | Wt 150.8 lb

## 2017-02-01 DIAGNOSIS — C7802 Secondary malignant neoplasm of left lung: Secondary | ICD-10-CM | POA: Diagnosis not present

## 2017-02-01 DIAGNOSIS — C7801 Secondary malignant neoplasm of right lung: Secondary | ICD-10-CM

## 2017-02-01 DIAGNOSIS — C3432 Malignant neoplasm of lower lobe, left bronchus or lung: Secondary | ICD-10-CM

## 2017-02-01 DIAGNOSIS — C3492 Malignant neoplasm of unspecified part of left bronchus or lung: Secondary | ICD-10-CM

## 2017-02-01 DIAGNOSIS — L270 Generalized skin eruption due to drugs and medicaments taken internally: Secondary | ICD-10-CM | POA: Diagnosis not present

## 2017-02-01 DIAGNOSIS — E039 Hypothyroidism, unspecified: Secondary | ICD-10-CM

## 2017-02-01 DIAGNOSIS — Z5111 Encounter for antineoplastic chemotherapy: Secondary | ICD-10-CM

## 2017-02-01 NOTE — Telephone Encounter (Signed)
Appointments scheduled per 4.12.18 LOS. Patient given AVS report and calendars with future scheduled appointments. °

## 2017-02-01 NOTE — Progress Notes (Signed)
[place] [phone]   Fax:([phone]) [place], MD [address] [place] [address]  DIAGNOSIS: Stage IV (T2a, N0, M1a) non-small cell lung cancer, adenocarcinoma with positive EGFR mutation with deletion in exon 19 diagnosed in June 2016 and presented with large mass in the left lower lobe in addition to multiple bilateral pulmonary nodules  PRIOR THERAPY: Gilotrif 40 mg by mouth daily started 05/11/2015, status post 9 months of treatment discontinued on 02/17/2016 secondary to extensive skin rash.  CURRENT THERAPY: Gilotrif 30 mg by mouth daily started 02/21/2016 status post 11 months of treatment.  INTERVAL HISTORY: Elizabeth [name] 71 y.o. female returns to the clinic today for follow-up visit accompanied by her husband. The patient is feeling fine today with no specific complaints except for mild skin rash and scabs in her scalp. She was seen by dermatology and treated with Bactrim with some improvement in her symptoms. She also uses hydrocortisone spray to the scalp area. She denied having any diarrhea. She denied having any chest pain, shortness of breath, cough or hemoptysis. She has no fever or chills. She denied having any nausea, vomiting, diarrhea or constipation. She had repeat CT scan of the chest performed recently and she is here for evaluation and discussion of her scan results.   MEDICAL HISTORY: Past Medical History:  Diagnosis Date  . Adenocarcinoma of left lung, stage 4 ([place]) 04/05/2015   Biopsy confirmed CT SCAN: There are innumerable bilateral pulmonary nodules. These range in size from about 5 mm to 2 cm. They demonstrate irregular indistinct borders, the larger ones demonstrating spiculation. PET SCAN: Diffuse pulmonary metastatic disease with a 4 cm dominant left lower low lung lesion. No enlarged or hypermetabolic mediastinal or hilar adenopathy.  . Allergy   . Anxiety   .  Cancer ([place])   . Drug-induced skin rash 01/17/2016  . Hyperlipidemia   . Hypertension   . Insomnia   . Prediabetes   . Thyroid disease     ALLERGIES:  is allergic to penicillins.  MEDICATIONS:  Current Outpatient Prescriptions  Medication Sig Dispense Refill  . ALPRAZolam (XANAX) 1 MG tablet TAKE 1/2 TO 1 TABLET BY MOUTH THREE TIMES DAILY AS NEEDED FOR ANXIETY 270 tablet 0  . aspirin 81 MG tablet Take 81 mg by mouth at bedtime.     . Cholecalciferol (VITAMIN D3) 10000 UNITS TABS Take by mouth.    . clindamycin (CLEOCIN T) 1 % external solution Apply 1 application topically daily as needed. 30 mL 1  . Clotrimazole 1 % OINT Place a small amount of the external right lip for angular chelitis 56.7 g 0  . Cyanocobalamin (VITAMIN B 12 PO) Take 1 tablet by mouth daily.    Marland Kitchen GILOTRIF 30 MG tablet TAKE 1 TABLET BY MOUTH ONCE DAILY. TAKE ON AN EMPTY STOMACH 1 HOUR BEFORE OR 2 HOURS AFTER MEALS 30 tablet 2  . HYDROcodone-homatropine (HYCODAN) 5-1.5 MG/5ML syrup Take 5 mLs by mouth every 6 (six) hours as needed for cough. (Patient not taking: Reported on 10/31/2016) 120 mL 0  . hydrocortisone cream 0.5 % Apply 1 application topically 2 (two) times daily.    Marland Kitchen ipratropium (ATROVENT) 0.03 % nasal spray Place 2 sprays into the nose 3 (three) times daily. 30 mL 2  . levothyroxine (SYNTHROID, LEVOTHROID) 50 MCG tablet TAKE 1 TABLET BY MOUTH EVERY DAY 90 tablet 1  . magnesium 30 MG tablet Take 30  mg by mouth 2 (two) times daily.    . montelukast (SINGULAIR) 10 MG tablet TAKE 1 TABLET BY MOUTH EVERY DAY 90 tablet 3  . mupirocin ointment (BACTROBAN) 2 % PLACE 1 APPLICATION INTO THE NOSE 2 (TWO) TIMES DAILY. 22 g 0  . prednisoLONE (PRELONE) 15 MG/5ML SOLN Please use enough to apply topically to the scalp for itching 450 mL 0  . Triprolidine-Pseudoephedrine (ANTIHISTAMINE PO) Take by mouth. Reported on 04/26/2016     No current facility-administered medications for this visit.     SURGICAL HISTORY:  Past  Surgical History:  Procedure Laterality Date  . ABDOMINAL HYSTERECTOMY  1995   w BSO  . TONSILLECTOMY AND ADENOIDECTOMY      REVIEW OF SYSTEMS:  Constitutional: negative Eyes: negative Ears, nose, mouth, throat, and face: negative Respiratory: negative Cardiovascular: negative Gastrointestinal: negative Genitourinary:negative Integument/breast: positive for dryness and rash Hematologic/lymphatic: negative Musculoskeletal:negative Neurological: negative Behavioral/Psych: negative Endocrine: negative Allergic/Immunologic: negative   PHYSICAL EXAMINATION: General appearance: alert, cooperative and no distress Head: Normocephalic, without obvious abnormality, atraumatic Neck: no adenopathy, no JVD, supple, symmetrical, trachea midline and thyroid not enlarged, symmetric, no tenderness/mass/nodules Lymph nodes: Cervical, supraclavicular, and axillary nodes normal. Resp: clear to auscultation bilaterally Back: symmetric, no curvature. ROM normal. No CVA tenderness. Cardio: regular rate and rhythm, S1, S2 normal, no murmur, click, rub or gallop GI: soft, non-tender; bowel sounds normal; no masses,  no organomegaly Extremities: extremities normal, atraumatic, no cyanosis or edema Neurologic: Alert and oriented X 3, normal strength and tone. Normal symmetric reflexes. Normal coordination and gait  ECOG PERFORMANCE STATUS: 0 - Asymptomatic  Blood pressure 133/75, pulse 71, temperature 97.7 F (36.5 C), temperature source Oral, resp. rate 18, height 5' (1.524 m), weight 150 lb 12.8 oz (68.4 kg), SpO2 100 %.  LABORATORY DATA: Lab Results  Component Value Date   WBC 6.2 01/26/2017   HGB 12.7 01/26/2017   HCT 38.1 01/26/2017   MCV 83.8 01/26/2017   PLT 195 01/26/2017      Chemistry      Component Value Date/Time   NA 139 01/26/2017 1041   K 5.0 01/26/2017 1041   CL 105 05/23/2016 1234   CO2 24 01/26/2017 1041   BUN 16.8 01/26/2017 1041   CREATININE 1.1 01/26/2017 1041        Component Value Date/Time   CALCIUM 9.2 01/26/2017 1041   ALKPHOS 79 01/26/2017 1041   AST 22 01/26/2017 1041   ALT 26 01/26/2017 1041   BILITOT 0.70 01/26/2017 1041       RADIOGRAPHIC STUDIES: Ct Chest W Contrast  Result Date: 01/26/2017 CLINICAL DATA:  Restaging left lung cancer.  Initial diagnosis 2016. EXAM: CT CHEST WITH CONTRAST TECHNIQUE: Multidetector CT imaging of the chest was performed during intravenous contrast administration. CONTRAST:  39m ISOVUE-300 IOPAMIDOL (ISOVUE-300) INJECTION 61% COMPARISON:  10/27/2016 FINDINGS: Chest wall: No breast masses, supraclavicular or axillary lymphadenopathy. The thyroid gland appears normal. Cardiovascular: The heart is normal in size. No pericardial effusion. 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 The aorta is normal in caliber. No dissection. The branch vessels are patent. Stable scattered coronary artery calcifications. Mediastinum/Nodes: No mediastinal or hilar mass or lymphadenopathy. The esophagus is grossly normal. Lungs/Pleura: Stable CT appearance of both lungs. In the there are numerous patchy ill-defined ground-glass opacities but no change since multiple prior studies. No worrisome or progressive findings/lesions. Stable treated lesion in the left lower lobe with chronic  radiation and bronchiectasis measuring a maximum of 3.5 cm. No evidence of recurrent tumor. No pleural effusions. No acute overlying pulmonary findings. Upper Abdomen: Stable hepatic hemangioma. No findings suspicious for hepatic or adrenal gland metastasis. Musculoskeletal: No significant bony findings. No evidence of osseous metastatic disease. IMPRESSION: Stable CT appearance of the chest when compared to multiple prior studies. Treated pulmonary metastatic disease with numerous vague scattered nodular densities but no progressive or new findings. Stable small scattered mediastinal and hilar lymph  nodes there No findings for upper abdominal metastatic disease. Electronically Signed   By: Marijo Sanes M.D.   On: 01/26/2017 14:58    ASSESSMENT AND PLAN:  This is a very pleasant 71 years old white female with a stage IV non-small cell lung cancer, adenocarcinoma with positive EGFR mutation with deletion in the 19 status post treatment with Gilotrif initially at 40 mg by mouth daily for 9 months than her dose was reduced to 30 mg by mouth daily status post 11 months. The patient is doing fine with no specific complaints and tolerating her treatment well except for the mild skin rash and scabs in her scalp. She had repeat CT scan of the chest performed recently. I personally and independently reviewed the scan images and discuss the results with the patient and her husband. Her scan showed no evidence for disease progression. I recommended for the patient to continue her current treatment with Gilotrif 30 mg by mouth daily. I will see her back for follow-up visit in 6 weeks for evaluation and repeat blood work. For the skin rash she will continue her current treatment with hydrocortisone lotions as needed. For the hypothyroidism, she will continue on levothyroxine. Her blood pressure has been well controlled recently and she would continue on close monitoring for now. The patient was advised to call immediately if she has any concerning symptoms in the interval. The patient voices understanding of current disease status and treatment options and is in agreement with the current care plan. All questions were answered. The patient knows to call the clinic with any problems, questions or concerns. We can certainly see the patient much sooner if necessary.  Disclaimer: This note was dictated with voice recognition software. Similar sounding words can inadvertently be transcribed and may not be corrected upon review.

## 2017-02-12 ENCOUNTER — Other Ambulatory Visit: Payer: Self-pay | Admitting: Internal Medicine

## 2017-02-12 NOTE — Telephone Encounter (Signed)
Please call [facility]  

## 2017-02-13 ENCOUNTER — Encounter: Payer: Self-pay | Admitting: *Deleted

## 2017-02-13 MED FILL — FLUOCINONIDE 0.05% SOLUTION: 0.05 | 30 days supply | Qty: 60 | Fill #2

## 2017-02-14 ENCOUNTER — Other Ambulatory Visit: Payer: Self-pay | Admitting: Internal Medicine

## 2017-02-14 MED FILL — GILOTRIF 30 MG TABLET: 30 | 30 days supply | Qty: 30 | Fill #0

## 2017-02-27 MED FILL — CLOBETASOL 0.05% SOLUTION: 0.05 | 20 days supply | Qty: 50 | Fill #1

## 2017-03-08 ENCOUNTER — Ambulatory Visit (INDEPENDENT_AMBULATORY_CARE_PROVIDER_SITE_OTHER): Payer: Medicare Other | Admitting: Internal Medicine

## 2017-03-08 ENCOUNTER — Encounter: Payer: Self-pay | Admitting: Internal Medicine

## 2017-03-08 VITALS — BP 114/70 | HR 76 | Temp 97.0°F | Resp 16 | Ht 60.0 in | Wt 149.8 lb

## 2017-03-08 DIAGNOSIS — E559 Vitamin D deficiency, unspecified: Secondary | ICD-10-CM | POA: Diagnosis not present

## 2017-03-08 DIAGNOSIS — Z79899 Other long term (current) drug therapy: Secondary | ICD-10-CM | POA: Diagnosis not present

## 2017-03-08 DIAGNOSIS — I10 Essential (primary) hypertension: Secondary | ICD-10-CM

## 2017-03-08 DIAGNOSIS — R7309 Other abnormal glucose: Secondary | ICD-10-CM | POA: Diagnosis not present

## 2017-03-08 DIAGNOSIS — E782 Mixed hyperlipidemia: Secondary | ICD-10-CM

## 2017-03-08 DIAGNOSIS — E039 Hypothyroidism, unspecified: Secondary | ICD-10-CM | POA: Diagnosis not present

## 2017-03-08 LAB — LIPID PANEL
Cholesterol: 157 mg/dL (ref <200)
HDL: 46 mg/dL — AB (ref >50)
LDL CALC: 86 mg/dL (ref <100)
Total CHOL/HDL Ratio: 3.4 Ratio (ref <5.0)
Triglycerides: 125 mg/dL (ref <150)
VLDL: 25 mg/dL (ref <30)

## 2017-03-08 LAB — TSH: TSH: 0.98 m[IU]/L

## 2017-03-08 NOTE — Patient Instructions (Signed)

## 2017-03-08 NOTE — Progress Notes (Signed)
This very nice 71 y.o. MWF presents for 3 month follow up with HTN, HLD, Pre-DM, Hypothyroidism and  Vit D Deficiency who was dx'd with Stage 4 LLL lung cancer (June 2016) and is followed on Gilotrif by Dr Julien Nordmann. .      Patient has hx/o HTN predating since 1997 and has tapered off of her diuretic over the last year & BP has been controlled at home. Today's BP is at goal - 114/70. Patient has had no complaints of any cardiac type chest pain, palpitations, dyspnea/orthopnea/PND, dizziness, claudication, or dependent edema.     Hyperlipidemia is not controlled with diet. Last Lipids were not at goal: Lab Results  Component Value Date   CHOL 189 05/23/2016   HDL 55 05/23/2016   LDLCALC 114 05/23/2016   TRIG 100 05/23/2016   CHOLHDL 3.4 05/23/2016      Also, the patient has history of PreDiabetes (A1c 5.7% in 2012)  and has had no symptoms of reactive hypoglycemia, diabetic polys, paresthesias or visual blurring.  Last A1c was  Lab Results  Component Value Date   HGBA1C 5.3 05/23/2016      Further, the patient also has history of Vitamin D Deficiency and supplements vitamin D without any suspected side-effects. Last vitamin D was at goal:   Lab Results  Component Value Date   VD25OH 100.0 09/27/2016   Current Outpatient Prescriptions on File Prior to Visit  Medication Sig  . aspirin 81 MG tablet Take 81 mg by mouth at bedtime.   . Cholecalciferol (VITAMIN D3) 10000 UNITS TABS Take by mouth.  . Cyanocobalamin (VITAMIN B 12 PO) Take 1 tablet by mouth daily.  Marland Kitchen GILOTRIF 30 MG tablet TAKE 1 TABLET BY MOUTH ONCE DAILY. TAKE ON AN EMPTY STOMACH 1 HOUR BEFORE OR 2 HOURS AFTER MEALS  . HYDROcodone-homatropine (HYCODAN) 5-1.5 MG/5ML syrup Take 5 mLs by mouth every 6 (six) hours as needed for cough.  Marland Kitchen ipratropium (ATROVENT) 0.03 % nasal spray Place 2 sprays into the nose 3 (three) times daily.  Marland Kitchen levothyroxine (SYNTHROID, LEVOTHROID) 50 MCG tablet TAKE 1 TABLET BY MOUTH EVERY DAY  .  montelukast (SINGULAIR) 10 MG tablet TAKE 1 TABLET BY MOUTH EVERY DAY  . mupirocin ointment (BACTROBAN) 2 % PLACE 1 APPLICATION INTO THE NOSE 2 (TWO) TIMES DAILY.  . Triprolidine-Pseudoephedrine (ANTIHISTAMINE PO) Take by mouth. Reported on 04/26/2016   No current facility-administered medications on file prior to visit.    Allergies  Allergen Reactions  . Penicillins Swelling and Rash   PMHx:   Past Medical History:  Diagnosis Date  . Adenocarcinoma of left lung, stage 4 ([place]) 04/05/2015   Biopsy confirmed CT SCAN: There are innumerable bilateral pulmonary nodules. These range in size from about 5 mm to 2 cm. They demonstrate irregular indistinct borders, the larger ones demonstrating spiculation. PET SCAN: Diffuse pulmonary metastatic disease with a 4 cm dominant left lower low lung lesion. No enlarged or hypermetabolic mediastinal or hilar adenopathy.  . Allergy   . Anxiety   . Cancer ([place])   . Drug-induced skin rash 01/17/2016  . Hyperlipidemia   . Hypertension   . Insomnia   . Prediabetes   . Thyroid disease    Immunization History  Administered Date(s) Administered  . Influenza, High Dose Seasonal PF 07/31/2014  . Influenza-Unspecified 09/13/2015, 07/23/2016  . Pneumococcal Conjugate-13 12/24/2015  . Pneumococcal Polysaccharide-23 07/22/2012  . Td 12/25/2005  . Zoster 01/09/2007   Past Surgical History:  Procedure Laterality Date  .  ABDOMINAL HYSTERECTOMY  1995   w BSO  . TONSILLECTOMY AND ADENOIDECTOMY     FHx:    Reviewed / unchanged  SHx:    Reviewed / unchanged  Systems Review:  Constitutional: Denies fever, chills, wt changes, headaches, insomnia, fatigue, night sweats, change in appetite. Eyes: Denies redness, blurred vision, diplopia, discharge, itchy, watery eyes.  ENT: Denies discharge, congestion, post nasal drip, epistaxis, sore throat, earache, hearing loss, dental pain, tinnitus, vertigo, sinus pain, snoring.  CV: Denies chest pain, palpitations,  irregular heartbeat, syncope, dyspnea, diaphoresis, orthopnea, PND, claudication or edema. Respiratory: denies cough, dyspnea, DOE, pleurisy, hoarseness, laryngitis, wheezing.  Gastrointestinal: Denies dysphagia, odynophagia, heartburn, reflux, water brash, abdominal pain or cramps, nausea, vomiting, bloating, diarrhea, constipation, hematemesis, melena, hematochezia  or hemorrhoids. Genitourinary: Denies dysuria, frequency, urgency, nocturia, hesitancy, discharge, hematuria or flank pain. Musculoskeletal: Denies arthralgias, myalgias, stiffness, jt. swelling, pain, limping or strain/sprain.  Skin: Denies pruritus, rash, hives, warts, acne, eczema or change in skin lesion(s). Neuro: No weakness, tremor, incoordination, spasms, paresthesia or pain. Psychiatric: Denies confusion, memory loss or sensory loss. Endo: Denies change in weight, skin or hair change.  Heme/Lymph: No excessive bleeding, bruising or enlarged lymph nodes.  Physical Exam  BP 114/70   Pulse 76   Temp 97 F (36.1 C)   Resp 16   Ht 5' (1.524 m)   Wt 149 lb 12.8 oz (67.9 kg)   BMI 29.26 kg/m   Appears well nourished, well groomed  and in no distress.  Eyes: PERRLA, EOMs, conjunctiva no swelling or erythema. Sinuses: No frontal/maxillary tenderness ENT/Mouth: EAC's clear, TM's nl w/o erythema, bulging. Nares clear w/o erythema, swelling, exudates. Oropharynx clear without erythema or exudates. Oral hygiene is good. Tongue normal, non obstructing. Hearing intact.  Neck: Supple. Thyroid nl. Car 2+/2+ without bruits, nodes or JVD. Chest: Respirations nl with BS clear & equal w/o rales, rhonchi, wheezing or stridor.  Cor: Heart sounds normal w/ regular rate and rhythm without sig. murmurs, gallops, clicks or rubs. Peripheral pulses normal and equal  without edema.  Abdomen: Soft & bowel sounds normal. Non-tender w/o guarding, rebound, hernias, masses or organomegaly.  Lymphatics: Unremarkable.  Musculoskeletal: Full ROM  all peripheral extremities, joint stability, 5/5 strength and normal gait.  Skin: Warm, dry without exposed rashes, lesions or ecchymosis apparent.  Neuro: Cranial nerves intact, reflexes equal bilaterally. Sensory-motor testing grossly intact. Tendon reflexes grossly intact.  Pysch: Alert & oriented x 3.  Insight and judgement nl & appropriate. No ideations.  Assessment and Plan:  1. Essential hypertension  - Continue medication, monitor blood pressure at home.  - Continue DASH diet. Reminder to go to the ER if any CP,  SOB, nausea, dizziness, severe HA, changes vision/speech,  left arm numbness and tingling and jaw pain.  - Magnesium - TSH  2. Hyperlipidemia, mixed  - Continue diet/meds, exercise,& lifestyle modifications.  - Continue monitor periodic cholesterol/liver & renal functions   - Lipid panel - TSH  3. PreDiabetes  - Continue diet, exercise, lifestyle modifications.  - Monitor appropriate labs.  - Hemoglobin A1c - Insulin, random  4. Vitamin D deficiency  - Continue supplementation. - VITAMIN D 25 Hydroxy   5. Acquired hypothyroidism  - TSH  6. Medication management  - Magnesium - Lipid panel - TSH - Hemoglobin A1c - Insulin, random - VITAMIN D 25 Hydroxy        Discussed  regular exercise, BP monitoring, weight control to achieve/maintain BMI less than 25 and discussed med and SE's.  Recommended labs to assess and monitor clinical status with further disposition pending results of labs. Over 30 minutes of exam, counseling, chart review was performed.

## 2017-03-09 LAB — HEMOGLOBIN A1C
Hgb A1c MFr Bld: 5.3 % (ref <5.7)
Mean Plasma Glucose: 105 mg/dL

## 2017-03-09 LAB — INSULIN, RANDOM: INSULIN: 5.2 u[IU]/mL (ref 2.0–19.6)

## 2017-03-09 LAB — MAGNESIUM: Magnesium: 1.9 mg/dL (ref 1.5–2.5)

## 2017-03-09 LAB — VITAMIN D 25 HYDROXY (VIT D DEFICIENCY, FRACTURES): Vit D, 25-Hydroxy: 100 ng/mL (ref 30–100)

## 2017-03-12 MED FILL — GILOTRIF 30 MG TABLET: 30 | 30 days supply | Qty: 30 | Fill #1

## 2017-03-15 ENCOUNTER — Encounter: Payer: Self-pay | Admitting: Internal Medicine

## 2017-03-15 ENCOUNTER — Ambulatory Visit (HOSPITAL_BASED_OUTPATIENT_CLINIC_OR_DEPARTMENT_OTHER): Payer: Medicare Other | Admitting: Internal Medicine

## 2017-03-15 ENCOUNTER — Telehealth: Payer: Self-pay | Admitting: Internal Medicine

## 2017-03-15 ENCOUNTER — Other Ambulatory Visit (HOSPITAL_BASED_OUTPATIENT_CLINIC_OR_DEPARTMENT_OTHER): Payer: Medicare Other

## 2017-03-15 VITALS — BP 120/71 | HR 77 | Temp 97.7°F | Resp 18 | Ht 60.0 in | Wt 148.4 lb

## 2017-03-15 DIAGNOSIS — C7801 Secondary malignant neoplasm of right lung: Secondary | ICD-10-CM | POA: Diagnosis not present

## 2017-03-15 DIAGNOSIS — C3432 Malignant neoplasm of lower lobe, left bronchus or lung: Secondary | ICD-10-CM | POA: Diagnosis not present

## 2017-03-15 DIAGNOSIS — L270 Generalized skin eruption due to drugs and medicaments taken internally: Secondary | ICD-10-CM | POA: Diagnosis not present

## 2017-03-15 DIAGNOSIS — C3492 Malignant neoplasm of unspecified part of left bronchus or lung: Secondary | ICD-10-CM

## 2017-03-15 DIAGNOSIS — Z5111 Encounter for antineoplastic chemotherapy: Secondary | ICD-10-CM

## 2017-03-15 DIAGNOSIS — C7802 Secondary malignant neoplasm of left lung: Secondary | ICD-10-CM | POA: Diagnosis not present

## 2017-03-15 LAB — COMPREHENSIVE METABOLIC PANEL
ALBUMIN: 3.5 g/dL (ref 3.5–5.0)
ALK PHOS: 96 U/L (ref 40–150)
ALT: 30 U/L (ref 0–55)
AST: 24 U/L (ref 5–34)
Anion Gap: 7 mEq/L (ref 3–11)
BUN: 20.3 mg/dL (ref 7.0–26.0)
CALCIUM: 9.4 mg/dL (ref 8.4–10.4)
CHLORIDE: 108 meq/L (ref 98–109)
CO2: 26 mEq/L (ref 22–29)
Creatinine: 0.9 mg/dL (ref 0.6–1.1)
EGFR: 61 mL/min/{1.73_m2} — AB (ref >90)
GLUCOSE: 58 mg/dL — AB (ref 70–140)
POTASSIUM: 4 meq/L (ref 3.5–5.1)
SODIUM: 141 meq/L (ref 136–145)
Total Bilirubin: 0.93 mg/dL (ref 0.20–1.20)
Total Protein: 6.9 g/dL (ref 6.4–8.3)

## 2017-03-15 LAB — CBC WITH DIFFERENTIAL/PLATELET
BASO%: 0.7 % (ref 0.0–2.0)
BASOS ABS: 0.1 10*3/uL (ref 0.0–0.1)
EOS%: 2 % (ref 0.0–7.0)
Eosinophils Absolute: 0.2 10*3/uL (ref 0.0–0.5)
HCT: 36.5 % (ref 34.8–46.6)
HEMOGLOBIN: 12.4 g/dL (ref 11.6–15.9)
LYMPH%: 14.8 % (ref 14.0–49.7)
MCH: 28.5 pg (ref 25.1–34.0)
MCHC: 33.9 g/dL (ref 31.5–36.0)
MCV: 83.9 fL (ref 79.5–101.0)
MONO#: 0.7 10*3/uL (ref 0.1–0.9)
MONO%: 8.8 % (ref 0.0–14.0)
NEUT#: 6.1 10*3/uL (ref 1.5–6.5)
NEUT%: 73.7 % (ref 38.4–76.8)
Platelets: 199 10*3/uL (ref 145–400)
RBC: 4.35 10*6/uL (ref 3.70–5.45)
RDW: 13.6 % (ref 11.2–14.5)
WBC: 8.3 10*3/uL (ref 3.9–10.3)
lymph#: 1.2 10*3/uL (ref 0.9–3.3)

## 2017-03-15 NOTE — Progress Notes (Signed)
[facility] Telephone:([phone]   Fax:(336) [phone]  OFFICE PROGRESS NOTE  [name], MD 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  CURRENT THERAPY: Gilotrif 30 mg by mouth daily started 02/21/2016 status post 13 months of treatment.  INTERVAL HISTORY: Elizabeth [name] 71 y.o. female returns to the clinic today for follow-up visit. The patient is currently on treatment with Gilotrif and tolerating it well except for the scabs in her scalp. She is scheduled to see her dermatologist tomorrow for evaluation of this condition. She denied having any weight loss or night sweats. She has no nausea, vomiting, diarrhea or constipation. She has no fever or chills. She has no chest pain, shortness of breath, cough or hemoptysis. She is here today for evaluation and repeat blood work.   MEDICAL HISTORY: Past Medical History:  Diagnosis Date  . Adenocarcinoma of left lung, stage 4 ([place]) 04/05/2015   Biopsy confirmed CT SCAN: There are innumerable bilateral pulmonary nodules. These range in size from about 5 mm to 2 cm. They demonstrate irregular indistinct borders, the larger ones demonstrating spiculation. PET SCAN: Diffuse pulmonary metastatic disease with a 4 cm dominant left lower low lung lesion. No enlarged or hypermetabolic mediastinal or hilar adenopathy.  . Allergy   . Anxiety   . Cancer ([place])   . Drug-induced skin rash 01/17/2016  . Hyperlipidemia   . Hypertension   . Insomnia   . Prediabetes   . Thyroid disease     ALLERGIES:  is  allergic to penicillins.  MEDICATIONS:  Current Outpatient Prescriptions  Medication Sig Dispense Refill  . aspirin 81 MG tablet Take 81 mg by mouth at bedtime.     . Cholecalciferol (VITAMIN D3) 10000 UNITS TABS Take by mouth.    . Cyanocobalamin (VITAMIN B 12 PO) Take 1 tablet by mouth daily.    Marland Kitchen GILOTRIF 30 MG tablet TAKE 1 TABLET BY MOUTH ONCE DAILY. TAKE ON AN EMPTY STOMACH 1 HOUR BEFORE OR 2 HOURS AFTER MEALS 30 tablet 2  . ibuprofen (ADVIL,MOTRIN) 200 MG tablet Take 200 mg by mouth every 6 (six) hours as needed.    Marland Kitchen ipratropium (ATROVENT) 0.03 % nasal spray Place 2 sprays into the nose 3 (three) times daily. 30 mL 2  . levothyroxine (SYNTHROID, LEVOTHROID) 50 MCG tablet TAKE 1 TABLET BY MOUTH EVERY DAY 90 tablet 1  . montelukast (SINGULAIR) 10 MG tablet TAKE 1 TABLET BY MOUTH EVERY DAY 90 tablet 3  . HYDROcodone-homatropine (HYCODAN) 5-1.5 MG/5ML syrup Take 5 mLs by mouth every 6 (six) hours as needed for cough. (Patient not taking: Reported on 03/15/2017) 120 mL 0  . mupirocin ointment (BACTROBAN) 2 % PLACE 1 APPLICATION INTO THE NOSE 2 (TWO) TIMES DAILY. (Patient not taking: Reported on 03/15/2017) 22 g 0   No current facility-administered medications for this visit.     SURGICAL HISTORY:  Past Surgical History:  Procedure Laterality Date  . ABDOMINAL HYSTERECTOMY  1995   w BSO  . TONSILLECTOMY AND ADENOIDECTOMY      REVIEW OF SYSTEMS:  A comprehensive review of  systems was negative except for: Integument/breast: positive for rash   PHYSICAL EXAMINATION: General appearance: alert, cooperative and no distress Head: Normocephalic, without obvious abnormality, atraumatic Neck: no adenopathy, no JVD, supple, symmetrical, trachea midline and thyroid not enlarged, symmetric, no tenderness/mass/nodules Lymph nodes: Cervical, supraclavicular, and axillary nodes normal. Resp: clear to auscultation bilaterally Back: symmetric, no curvature. ROM normal. No CVA tenderness. Cardio:  regular rate and rhythm, S1, S2 normal, no murmur, click, rub or gallop GI: soft, non-tender; bowel sounds normal; no masses,  no organomegaly Extremities: extremities normal, atraumatic, no cyanosis or edema  ECOG PERFORMANCE STATUS: 0 - Asymptomatic  Blood pressure 120/71, pulse 77, temperature 97.7 F (36.5 C), temperature source Oral, resp. rate 18, height 5' (1.524 m), weight 148 lb 6.4 oz (67.3 kg), SpO2 100 %.  LABORATORY DATA: Lab Results  Component Value Date   WBC 8.3 03/15/2017   HGB 12.4 03/15/2017   HCT 36.5 03/15/2017   MCV 83.9 03/15/2017   PLT 199 03/15/2017      Chemistry      Component Value Date/Time   NA 139 01/26/2017 1041   K 5.0 01/26/2017 1041   CL 105 05/23/2016 1234   CO2 24 01/26/2017 1041   BUN 16.8 01/26/2017 1041   CREATININE 1.1 01/26/2017 1041      Component Value Date/Time   CALCIUM 9.2 01/26/2017 1041   ALKPHOS 79 01/26/2017 1041   AST 22 01/26/2017 1041   ALT 26 01/26/2017 1041   BILITOT 0.70 01/26/2017 1041       RADIOGRAPHIC STUDIES: No results found.  ASSESSMENT AND PLAN:  This is a very pleasant 71 years old right female with a stage IV non-small cell lung cancer, adenocarcinoma with positive EGFR mutation with deletion in exon 19. The patient is currently undergoing treatment with Gilotrif 30 mg by mouth daily status post 17 months. She is tolerating her treatment well with no significant adverse effects except for the skin rash in the scalp. She is scheduled to see her dermatologist tomorrow for evaluation of these lesions. I recommended for the patient to continue her treatment with Gilotrif at the same dose. If the patient continues to have more trouble with Gilotrif, I may consider switching her to treatment with Tagrisso. I will see her back for follow-up visit in 6 weeks for reevaluation with repeat blood work. She was advised to call immediately if she has any concerning symptoms in the interval. The patient voices  understanding of current disease status and treatment options and is in agreement with the current care plan. All questions were answered. The patient knows to call the clinic with any problems, questions or concerns. We can certainly see the patient much sooner if necessary. I spent 10 minutes counseling the patient face to face. The total time spent in the appointment was 15 minutes.  Disclaimer: This note was dictated with voice recognition software. Similar sounding words can inadvertently be transcribed and may not be corrected upon review.

## 2017-03-15 NOTE — Telephone Encounter (Signed)
Gave patient avs report and appointments for July.  °

## 2017-03-16 MED FILL — FLUOCINONIDE 0.05% SOLUTION: 0.05 | 30 days supply | Qty: 60 | Fill #0

## 2017-04-05 ENCOUNTER — Other Ambulatory Visit: Payer: Self-pay | Admitting: Internal Medicine

## 2017-04-05 NOTE — Telephone Encounter (Signed)
Please call [facility]  

## 2017-04-09 MED FILL — GILOTRIF 30 MG TABLET: 30 | 30 days supply | Qty: 30 | Fill #2

## 2017-04-13 MED FILL — IPRATROPIUM 0.03% SPRAY: 0.03 | 30 days supply | Qty: 30 | Fill #2

## 2017-04-20 ENCOUNTER — Other Ambulatory Visit: Payer: Self-pay | Admitting: Internal Medicine

## 2017-04-24 ENCOUNTER — Telehealth: Payer: Self-pay | Admitting: Internal Medicine

## 2017-04-24 ENCOUNTER — Ambulatory Visit (HOSPITAL_BASED_OUTPATIENT_CLINIC_OR_DEPARTMENT_OTHER): Payer: Medicare Other | Admitting: Internal Medicine

## 2017-04-24 ENCOUNTER — Other Ambulatory Visit (HOSPITAL_BASED_OUTPATIENT_CLINIC_OR_DEPARTMENT_OTHER): Payer: Medicare Other

## 2017-04-24 ENCOUNTER — Encounter: Payer: Self-pay | Admitting: Internal Medicine

## 2017-04-24 VITALS — BP 139/74 | HR 70 | Temp 97.7°F | Resp 18 | Ht 60.0 in | Wt 149.3 lb

## 2017-04-24 DIAGNOSIS — L270 Generalized skin eruption due to drugs and medicaments taken internally: Secondary | ICD-10-CM

## 2017-04-24 DIAGNOSIS — C7802 Secondary malignant neoplasm of left lung: Secondary | ICD-10-CM

## 2017-04-24 DIAGNOSIS — C3492 Malignant neoplasm of unspecified part of left bronchus or lung: Secondary | ICD-10-CM

## 2017-04-24 DIAGNOSIS — C3432 Malignant neoplasm of lower lobe, left bronchus or lung: Secondary | ICD-10-CM | POA: Diagnosis not present

## 2017-04-24 DIAGNOSIS — C7801 Secondary malignant neoplasm of right lung: Secondary | ICD-10-CM | POA: Diagnosis not present

## 2017-04-24 DIAGNOSIS — Z5111 Encounter for antineoplastic chemotherapy: Secondary | ICD-10-CM

## 2017-04-24 LAB — COMPREHENSIVE METABOLIC PANEL
ALT: 30 U/L (ref 0–55)
ANION GAP: 9 meq/L (ref 3–11)
AST: 30 U/L (ref 5–34)
Albumin: 3.5 g/dL (ref 3.5–5.0)
Alkaline Phosphatase: 80 U/L (ref 40–150)
BUN: 17.6 mg/dL (ref 7.0–26.0)
CO2: 26 mEq/L (ref 22–29)
Calcium: 8.9 mg/dL (ref 8.4–10.4)
Chloride: 106 mEq/L (ref 98–109)
Creatinine: 1.1 mg/dL (ref 0.6–1.1)
EGFR: 52 mL/min/{1.73_m2} — AB (ref >90)
GLUCOSE: 91 mg/dL (ref 70–140)
POTASSIUM: 4.4 meq/L (ref 3.5–5.1)
SODIUM: 141 meq/L (ref 136–145)
Total Bilirubin: 0.71 mg/dL (ref 0.20–1.20)
Total Protein: 6.8 g/dL (ref 6.4–8.3)

## 2017-04-24 LAB — CBC WITH DIFFERENTIAL/PLATELET
BASO%: 0.8 % (ref 0.0–2.0)
BASOS ABS: 0.1 10*3/uL (ref 0.0–0.1)
EOS ABS: 0.2 10*3/uL (ref 0.0–0.5)
EOS%: 2.3 % (ref 0.0–7.0)
HCT: 37.2 % (ref 34.8–46.6)
HEMOGLOBIN: 12.4 g/dL (ref 11.6–15.9)
LYMPH%: 18.1 % (ref 14.0–49.7)
MCH: 28.2 pg (ref 25.1–34.0)
MCHC: 33.3 g/dL (ref 31.5–36.0)
MCV: 84.6 fL (ref 79.5–101.0)
MONO#: 0.5 10*3/uL (ref 0.1–0.9)
MONO%: 6.8 % (ref 0.0–14.0)
NEUT#: 5 10*3/uL (ref 1.5–6.5)
NEUT%: 72 % (ref 38.4–76.8)
Platelets: 209 10*3/uL (ref 145–400)
RBC: 4.39 10*6/uL (ref 3.70–5.45)
RDW: 14 % (ref 11.2–14.5)
WBC: 7 10*3/uL (ref 3.9–10.3)
lymph#: 1.3 10*3/uL (ref 0.9–3.3)

## 2017-04-24 NOTE — Telephone Encounter (Signed)
Scheduled appt per 7/3 los - Gave patient AVS and calender per los. [facility] to contact patient with ct  Schedule.

## 2017-04-24 NOTE — Progress Notes (Signed)
[facility] [phone]   [phone]  OFFICE PROGRESS NOTE  [name], MD [address] [place] [address]  DIAGNOSIS: Stage IV (T2a, N0, M1a) non-small cell lung cancer, adenocarcinoma with positive EGFR mutation with deletion in exon 19 diagnosed in June 2016 and presented with large mass in the left lower lobe in addition to multiple bilateral pulmonary nodules  PRIOR THERAPY: Gilotrif 40 mg by mouth daily started 05/11/2015, status post 9 months of treatment discontinued on 02/17/2016 secondary to extensive skin rash.  CURRENT THERAPY: Gilotrif 30 mg by mouth daily started 02/21/2016 status post 14 months of treatment.  INTERVAL HISTORY: Elizabeth [name] 71 y.o. female returns to the clinic today for follow-up visit. The patient is feeling fine today was no specific complaints. This scabs in her Scalp is much better after she started treatment with Bactrim. She denied having any recent weight loss or night sweats. She has no nausea, vomiting, diarrhea or constipation. She has no fever or chills. The patient denied having any chest pain, shortness of breath, cough or hemoptysis. She is here today for evaluation and repeat blood work.  MEDICAL HISTORY: Past Medical History:  Diagnosis Date  . Adenocarcinoma of left lung, stage 4 ([place]) 04/05/2015   Biopsy confirmed CT SCAN: There are innumerable bilateral pulmonary nodules. These range in size from about 5 mm to 2 cm. They demonstrate irregular indistinct borders, the larger ones demonstrating spiculation. PET SCAN: Diffuse pulmonary metastatic disease with a 4 cm dominant left lower low lung lesion. No enlarged or hypermetabolic mediastinal or hilar adenopathy.  . Allergy   . Anxiety   . Cancer ([place])   . Drug-induced skin rash 01/17/2016  . Hyperlipidemia   . Hypertension   . Insomnia   . Prediabetes   . Thyroid disease     ALLERGIES:  is allergic to  penicillins.  MEDICATIONS:  Current Outpatient Prescriptions  Medication Sig Dispense Refill  . ALPRAZolam (XANAX) 1 MG tablet TAKE 1/2 TO 1 TABLET BY MOUTH 2 TO 3 TIMES DAILY ONLY AS NEEDED FOR ANXIETY 90 tablet 0  . aspirin 81 MG tablet Take 81 mg by mouth at bedtime.     . Cholecalciferol (VITAMIN D3) 10000 UNITS TABS Take by mouth.    . clobetasol (TEMOVATE) 0.05 % external solution   2  . coal tar (NEUTROGENA T-GEL) 0.5 % shampoo Apply topically at bedtime as needed.    . Cyanocobalamin (VITAMIN B 12 PO) Take 1 tablet by mouth daily.    . fluocinonide (LIDEX) 0.05 % external solution APP TO THE SCALP D PRN  2  . GILOTRIF 30 MG tablet TAKE 1 TABLET BY MOUTH ONCE DAILY. TAKE ON AN EMPTY STOMACH 1 HOUR BEFORE OR 2 HOURS AFTER MEALS 30 tablet 2  . HYDROcodone-homatropine (HYCODAN) 5-1.5 MG/5ML syrup Take 5 mLs by mouth every 6 (six) hours as needed for cough. 120 mL 0  . ibuprofen (ADVIL,MOTRIN) 200 MG tablet Take 200 mg by mouth every 6 (six) hours as needed.    Marland Kitchen ipratropium (ATROVENT) 0.03 % nasal spray Place 2 sprays into the nose 3 (three) times daily. 30 mL 2  . ketoconazole (NIZORAL) 2 % shampoo APP ON THE SKIN TWICE WEEKLY  3  . levothyroxine (SYNTHROID, LEVOTHROID) 50 MCG tablet TAKE 1 TABLET BY MOUTH EVERY DAY 90 tablet 0  . montelukast (SINGULAIR) 10 MG tablet TAKE 1 TABLET BY MOUTH EVERY DAY 90 tablet 3  . mupirocin  ointment (BACTROBAN) 2 % PLACE 1 APPLICATION INTO THE NOSE 2 (TWO) TIMES DAILY. 22 g 0  . sulfamethoxazole-trimethoprim (BACTRIM DS,SEPTRA DS) 800-160 MG tablet TK 1 T PO BID  1   No current facility-administered medications for this visit.     SURGICAL HISTORY:  Past Surgical History:  Procedure Laterality Date  . ABDOMINAL HYSTERECTOMY  1995   w BSO  . TONSILLECTOMY AND ADENOIDECTOMY      REVIEW OF SYSTEMS:  A comprehensive review of systems was negative except for: Integument/breast: positive for rash   PHYSICAL EXAMINATION: General appearance: alert,  cooperative and no distress Head: Normocephalic, without obvious abnormality, atraumatic Neck: no adenopathy, no JVD, supple, symmetrical, trachea midline and thyroid not enlarged, symmetric, no tenderness/mass/nodules Lymph nodes: Cervical, supraclavicular, and axillary nodes normal. Resp: clear to auscultation bilaterally Back: symmetric, no curvature. ROM normal. No CVA tenderness. Cardio: regular rate and rhythm, S1, S2 normal, no murmur, click, rub or gallop GI: soft, non-tender; bowel sounds normal; no masses,  no organomegaly Extremities: extremities normal, atraumatic, no cyanosis or edema  ECOG PERFORMANCE STATUS: 0 - Asymptomatic  Blood pressure 139/74, pulse 70, temperature 97.7 F (36.5 C), temperature source Oral, resp. rate 18, height 5' (1.524 m), weight 149 lb 4.8 oz (67.7 kg), SpO2 100 %.  LABORATORY DATA: Lab Results  Component Value Date   WBC 7.0 04/24/2017   HGB 12.4 04/24/2017   HCT 37.2 04/24/2017   MCV 84.6 04/24/2017   PLT 209 04/24/2017      Chemistry      Component Value Date/Time   NA 141 04/24/2017 1250   K 4.4 04/24/2017 1250   CL 105 05/23/2016 1234   CO2 26 04/24/2017 1250   BUN 17.6 04/24/2017 1250   CREATININE 1.1 04/24/2017 1250      Component Value Date/Time   CALCIUM 8.9 04/24/2017 1250   ALKPHOS 80 04/24/2017 1250   AST 30 04/24/2017 1250   ALT 30 04/24/2017 1250   BILITOT 0.71 04/24/2017 1250       RADIOGRAPHIC STUDIES: No results found.  ASSESSMENT AND PLAN:  This is a very pleasant 71 years old white female with stage IV non-small cell lung cancer, adenocarcinoma with positive EGFR mutation with deletion in next 52 and currently undergoing treatment with Gilotrif initially at a dose of 40 mg for 9 months followed by 30 mg by mouth daily status post 14 months. She is tolerating the treatment well except for the skin rash mainly on the scalp. Her lab work is unremarkable today. I recommended for the patient to continue her  current treatment with Gilotrif with the same dose. I will see her back for follow-up visit in 6 weeks for evaluation after repeating CT scan of the chest for restaging of her disease. She was advised to call immediately if she has any concerning symptoms in the interval. The patient voices understanding of current disease status and treatment options and is in agreement with the current care plan. All questions were answered. The patient knows to call the clinic with any problems, questions or concerns. We can certainly see the patient much sooner if necessary. I spent 10 minutes counseling the patient face to face. The total time spent in the appointment was 15 minutes.  Disclaimer: This note was dictated with voice recognition software. Similar sounding words can inadvertently be transcribed and may not be corrected upon review.

## 2017-04-27 ENCOUNTER — Ambulatory Visit: Payer: Self-pay | Admitting: Physician Assistant

## 2017-05-08 MED FILL — SULFAMETHOXAZOLE/TMP DS TAB: 800-160 | 10 days supply | Qty: 20 | Fill #0

## 2017-05-09 ENCOUNTER — Other Ambulatory Visit: Payer: Self-pay | Admitting: Internal Medicine

## 2017-05-14 MED FILL — GILOTRIF 30 MG TABLET: 30 | 30 days supply | Qty: 30 | Fill #0

## 2017-05-22 MED FILL — BETAMETHASONE DP 0.05% CRM: 0.05 | 20 days supply | Qty: 45 | Fill #0

## 2017-05-22 MED FILL — SULFAMETHOXAZOLE/TMP DS TAB: 800-160 | 30 days supply | Qty: 60 | Fill #0

## 2017-06-01 ENCOUNTER — Ambulatory Visit (HOSPITAL_COMMUNITY)
Admission: RE | Admit: 2017-06-01 | Discharge: 2017-06-01 | Disposition: A | Discharge status: Home / Self Care | Payer: Medicare Other | Source: Ambulatory Visit | Attending: Internal Medicine | Admitting: Internal Medicine

## 2017-06-01 ENCOUNTER — Other Ambulatory Visit (HOSPITAL_BASED_OUTPATIENT_CLINIC_OR_DEPARTMENT_OTHER): Payer: Medicare Other

## 2017-06-01 DIAGNOSIS — Z9889 Other specified postprocedural states: Secondary | ICD-10-CM | POA: Diagnosis not present

## 2017-06-01 DIAGNOSIS — L270 Generalized skin eruption due to drugs and medicaments taken internally: Secondary | ICD-10-CM

## 2017-06-01 DIAGNOSIS — C3492 Malignant neoplasm of unspecified part of left bronchus or lung: Secondary | ICD-10-CM

## 2017-06-01 DIAGNOSIS — Z5111 Encounter for antineoplastic chemotherapy: Secondary | ICD-10-CM

## 2017-06-01 DIAGNOSIS — R918 Other nonspecific abnormal finding of lung field: Secondary | ICD-10-CM | POA: Insufficient documentation

## 2017-06-01 DIAGNOSIS — J479 Bronchiectasis, uncomplicated: Secondary | ICD-10-CM | POA: Insufficient documentation

## 2017-06-01 LAB — CBC WITH DIFFERENTIAL/PLATELET
BASO%: 0.6 % (ref 0.0–2.0)
BASOS ABS: 0 10*3/uL (ref 0.0–0.1)
EOS ABS: 0.2 10*3/uL (ref 0.0–0.5)
EOS%: 3.3 % (ref 0.0–7.0)
HCT: 35.8 % (ref 34.8–46.6)
HGB: 12 g/dL (ref 11.6–15.9)
LYMPH%: 21.9 % (ref 14.0–49.7)
MCH: 28.9 pg (ref 25.1–34.0)
MCHC: 33.5 g/dL (ref 31.5–36.0)
MCV: 86.3 fL (ref 79.5–101.0)
MONO#: 0.6 10*3/uL (ref 0.1–0.9)
MONO%: 10.1 % (ref 0.0–14.0)
NEUT#: 3.5 10*3/uL (ref 1.5–6.5)
NEUT%: 64.1 % (ref 38.4–76.8)
PLATELETS: 169 10*3/uL (ref 145–400)
RBC: 4.15 10*6/uL (ref 3.70–5.45)
RDW: 13.5 % (ref 11.2–14.5)
WBC: 5.4 10*3/uL (ref 3.9–10.3)
lymph#: 1.2 10*3/uL (ref 0.9–3.3)

## 2017-06-01 LAB — COMPREHENSIVE METABOLIC PANEL
ALT: 21 U/L (ref 0–55)
ANION GAP: 6 meq/L (ref 3–11)
AST: 23 U/L (ref 5–34)
Albumin: 3.3 g/dL — ABNORMAL LOW (ref 3.5–5.0)
Alkaline Phosphatase: 69 U/L (ref 40–150)
BUN: 16.4 mg/dL (ref 7.0–26.0)
CHLORIDE: 108 meq/L (ref 98–109)
CO2: 25 meq/L (ref 22–29)
Calcium: 8.9 mg/dL (ref 8.4–10.4)
Creatinine: 1 mg/dL (ref 0.6–1.1)
EGFR: 59 mL/min/{1.73_m2} — AB (ref >90)
Glucose: 79 mg/dl (ref 70–140)
POTASSIUM: 4 meq/L (ref 3.5–5.1)
Sodium: 140 mEq/L (ref 136–145)
Total Bilirubin: 0.96 mg/dL (ref 0.20–1.20)
Total Protein: 6.4 g/dL (ref 6.4–8.3)

## 2017-06-01 MED ORDER — IOPAMIDOL (ISOVUE-300) INJECTION 61%
INTRAVENOUS | Status: AC
  Administered 2017-06-01: 75 mL
  Filled 2017-06-01: qty 75

## 2017-06-04 ENCOUNTER — Ambulatory Visit (HOSPITAL_BASED_OUTPATIENT_CLINIC_OR_DEPARTMENT_OTHER): Payer: Medicare Other | Admitting: Internal Medicine

## 2017-06-04 ENCOUNTER — Encounter: Payer: Self-pay | Admitting: Internal Medicine

## 2017-06-04 ENCOUNTER — Telehealth: Payer: Self-pay | Admitting: Internal Medicine

## 2017-06-04 VITALS — BP 156/71 | HR 93 | Temp 98.0°F | Resp 18 | Ht 60.0 in | Wt 150.9 lb

## 2017-06-04 DIAGNOSIS — C7802 Secondary malignant neoplasm of left lung: Secondary | ICD-10-CM

## 2017-06-04 DIAGNOSIS — C7801 Secondary malignant neoplasm of right lung: Secondary | ICD-10-CM | POA: Diagnosis not present

## 2017-06-04 DIAGNOSIS — C3432 Malignant neoplasm of lower lobe, left bronchus or lung: Secondary | ICD-10-CM

## 2017-06-04 DIAGNOSIS — L270 Generalized skin eruption due to drugs and medicaments taken internally: Secondary | ICD-10-CM

## 2017-06-04 DIAGNOSIS — C3492 Malignant neoplasm of unspecified part of left bronchus or lung: Secondary | ICD-10-CM

## 2017-06-04 DIAGNOSIS — E039 Hypothyroidism, unspecified: Secondary | ICD-10-CM

## 2017-06-04 DIAGNOSIS — Z5111 Encounter for antineoplastic chemotherapy: Secondary | ICD-10-CM

## 2017-06-04 MED ORDER — HYDROCODONE-HOMATROPINE 5-1.5 MG/5ML PO SYRP: 5.0000 mL | ORAL_SOLUTION | Freq: Four times a day (QID) | ORAL | 0 refills | Status: DC | PRN

## 2017-06-04 NOTE — Progress Notes (Signed)
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  CURRENT THERAPY: Gilotrif 30 mg by mouth daily started 02/21/2016 status post 15 months of treatment.  INTERVAL HISTORY: Elizabeth [name] 71 y.o. female returns to the clinic today for follow-up visit accompanied by her husband and sister. The patient is feeling well today with no specific complaints. She continues to tolerate her treatment with Gilotrif fairly well. She uses Bactrim for the rash on the scalp 1 week at a time. She denied having any chest pain, shortness of breath, cough or hemoptysis. She denied having any nausea, vomiting, diarrhea or constipation. She has no fever or chills. She had repeat CT scan of the chest performed recently and she is here for evaluation and discussion of her scan results.  MEDICAL HISTORY: Past Medical History:  Diagnosis Date  . Adenocarcinoma of left lung, stage 4 ([place]) 04/05/2015   Biopsy confirmed CT SCAN: There are innumerable bilateral pulmonary nodules. These range in size from about 5 mm to 2 cm. They demonstrate irregular indistinct borders, the larger ones demonstrating spiculation. PET SCAN: Diffuse pulmonary metastatic disease with a 4 cm dominant left lower low lung lesion. No enlarged or hypermetabolic mediastinal or hilar adenopathy.  . Allergy   . Anxiety   . Cancer ([place])   . Drug-induced skin rash 01/17/2016  . Hyperlipidemia   . Hypertension   .  Insomnia   . Prediabetes   . Thyroid disease     ALLERGIES:  is allergic to penicillins.  MEDICATIONS:  Current Outpatient Prescriptions  Medication Sig Dispense Refill  . ALPRAZolam (XANAX) 1 MG tablet TAKE 1/2 TO 1 TABLET BY MOUTH 2 TO 3 TIMES DAILY ONLY AS NEEDED FOR ANXIETY 90 tablet 0  . aspirin 81 MG tablet Take 81 mg by mouth at bedtime.     . Cholecalciferol (VITAMIN D3) 10000 UNITS TABS Take by mouth.    . coal tar (NEUTROGENA T-GEL) 0.5 % shampoo Apply topically at bedtime as needed.    . Cyanocobalamin (VITAMIN B 12 PO) Take 1 tablet by mouth daily.    Marland Kitchen GILOTRIF 30 MG tablet TAKE 1 TABLET BY MOUTH ONCE DAILY. TAKE ON AN EMPTY STOMACH 1 HOUR BEFORE OR 2 HOURS AFTER MEALS 30 tablet 2  . HYDROcodone-homatropine (HYCODAN) 5-1.5 MG/5ML syrup Take 5 mLs by mouth every 6 (six) hours as needed for cough. 120 mL 0  . ibuprofen (ADVIL,MOTRIN) 200 MG tablet Take 200 mg by mouth every 6 (six) hours as needed.    Marland Kitchen ipratropium (ATROVENT) 0.03 % nasal spray Place 2 sprays into the nose 3 (three) times daily. 30 mL 2  . levothyroxine (SYNTHROID, LEVOTHROID) 50 MCG tablet TAKE 1 TABLET BY MOUTH EVERY DAY 90 tablet 0  . montelukast (SINGULAIR) 10 MG tablet TAKE 1 TABLET BY MOUTH EVERY DAY 90 tablet 3  . clobetasol (TEMOVATE) 0.05 % external solution   2  . fluocinonide (LIDEX) 0.05 % external solution APP TO THE SCALP D PRN  2  .  ketoconazole (NIZORAL) 2 % shampoo APP ON THE SKIN TWICE WEEKLY  3  . mupirocin ointment (BACTROBAN) 2 % PLACE 1 APPLICATION INTO THE NOSE 2 (TWO) TIMES DAILY. (Patient not taking: Reported on 06/04/2017) 22 g 0  . pyrithione zinc (HEAD AND SHOULDERS) 1 % shampoo     . sulfamethoxazole-trimethoprim (BACTRIM DS,SEPTRA DS) 800-160 MG tablet TK 1 T PO BID  1   No current facility-administered medications for this visit.     SURGICAL HISTORY:  Past Surgical History:  Procedure Laterality Date  . ABDOMINAL HYSTERECTOMY  1995   w BSO  . TONSILLECTOMY AND  ADENOIDECTOMY      REVIEW OF SYSTEMS:  Constitutional: negative Eyes: negative Ears, nose, mouth, throat, and face: negative Respiratory: negative Cardiovascular: negative Gastrointestinal: negative Genitourinary:negative Integument/breast: positive for rash Hematologic/lymphatic: negative Musculoskeletal:negative Neurological: negative Behavioral/Psych: negative Endocrine: negative Allergic/Immunologic: negative   PHYSICAL EXAMINATION: General appearance: alert, cooperative and no distress Head: Normocephalic, without obvious abnormality, atraumatic Neck: no adenopathy, no JVD, supple, symmetrical, trachea midline and thyroid not enlarged, symmetric, no tenderness/mass/nodules Lymph nodes: Cervical, supraclavicular, and axillary nodes normal. Resp: clear to auscultation bilaterally Back: symmetric, no curvature. ROM normal. No CVA tenderness. Cardio: regular rate and rhythm, S1, S2 normal, no murmur, click, rub or gallop GI: soft, non-tender; bowel sounds normal; no masses,  no organomegaly Extremities: extremities normal, atraumatic, no cyanosis or edema Neurologic: Alert and oriented X 3, normal strength and tone. Normal symmetric reflexes. Normal coordination and gait  ECOG PERFORMANCE STATUS: 0 - Asymptomatic  Blood pressure (!) 156/71, pulse 93, temperature 98 F (36.7 C), temperature source Oral, resp. rate 18, height 5' (1.524 m), weight 150 lb 14.4 oz (68.4 kg), SpO2 97 %.  LABORATORY DATA: Lab Results  Component Value Date   WBC 5.4 06/01/2017   HGB 12.0 06/01/2017   HCT 35.8 06/01/2017   MCV 86.3 06/01/2017   PLT 169 06/01/2017      Chemistry      Component Value Date/Time   NA 140 06/01/2017 1036   K 4.0 06/01/2017 1036   CL 105 05/23/2016 1234   CO2 25 06/01/2017 1036   BUN 16.4 06/01/2017 1036   CREATININE 1.0 06/01/2017 1036      Component Value Date/Time   CALCIUM 8.9 06/01/2017 1036   ALKPHOS 69 06/01/2017 1036   AST 23 06/01/2017 1036   ALT  21 06/01/2017 1036   BILITOT 0.96 06/01/2017 1036       RADIOGRAPHIC STUDIES: Ct Chest W Contrast  Result Date: 06/01/2017 CLINICAL DATA:  Lung ca; oral chemo ongoing EXAM: CT CHEST WITH CONTRAST TECHNIQUE: Multidetector CT imaging of the chest was performed during intravenous contrast administration. CONTRAST:  18m ISOVUE-300 IOPAMIDOL (ISOVUE-300) INJECTION 61% COMPARISON:  CT 01/26/2017 FINDINGS: Cardiovascular: No significant vascular findings. Normal heart size. No pericardial effusion. Mediastinum/Nodes: No axillary supraclavicular nodes. No mediastinal hilar nodes. No pericardial effusion Lungs/Pleura: Ill-defined mixed reticulonodular solid and ground-glass nodules in the upper lobes are unchanged from comparison exam. No new pulmonary nodules. Infrahilar consolidation bronchiectasis in the LEFT lower lobe measuring 3.6 cm unchanged. Example nodular density in the RIGHT lower lobe measuring 9 mm (75, series 5) is unchanged. Upper Abdomen: Insert limited abdomen hemangioma in the RIGHT hepatic lobe. Musculoskeletal: No aggressive osseous lesion IMPRESSION: 1. Stable mixed solid and ground-glass nodules with no new nodularity. 2. Stable infrahilar consolidation and bronchiectasis in LEFT lower lobe. 3. No evidence of disease progression Electronically Signed   By: SSuzy BouchardM.D.   On: 06/01/2017  16:31    ASSESSMENT AND PLAN:  This is a very pleasant 71 years old white female with stage IV non-small cell lung cancer, adenocarcinoma with positive EGFR mutation with deletion in next 28 and currently undergoing treatment with Gilotrif initially at a dose of 40 mg for 9 months followed by 30 mg by mouth daily status post 15 months.  The patient continues to tolerate her treatment well with no significant adverse effects except for the scalp rash improved with Bactrim. She had repeat CT scan of the chest performed recently. I personally and independently reviewed the scans and discuss the  results with the patient and her family. Her scan showed no evidence for disease progression. I recommended for the patient to continue her current treatment with Gilotrif. I would see her back for follow-up visit in 6 weeks for evaluation and repeat blood work. For hypertension, strongly encouraged the patient to monitor her blood pressure closely at home and to consult with his primary care physician if no improvement in her condition. She was advised to call immediately if she has any concerning symptoms in the interval. The patient voices understanding of current disease status and treatment options and is in agreement with the current care plan. All questions were answered. The patient knows to call the clinic with any problems, questions or concerns. We can certainly see the patient much sooner if necessary.  Disclaimer: This note was dictated with voice recognition software. Similar sounding words can inadvertently be transcribed and may not be corrected upon review.

## 2017-06-04 NOTE — Telephone Encounter (Signed)
Gave patient avs and calendars for upcoming appts.

## 2017-06-12 NOTE — Progress Notes (Signed)
Patient ID: Elizabeth [name], female   DOB: November 20, 1945, 71 y.o.   MRN: [id number]  MEDICARE ANNUAL WELLNESS VISIT AND OV  Assessment:    Essential hypertension - continue medications, DASH diet, exercise and monitor at home. Call if greater than 130/80.  -     CBC with Differential/Platelet -     BASIC METABOLIC PANEL WITH GFR -     Hepatic function panel  Aortic atherosclerosis (HCC) Control blood pressure, cholesterol, glucose, increase exercise.   Adenocarcinoma of left lung, stage 4 (HCC) Cont follow up  Acquired hypothyroidism Hypothyroidism-check TSH level, continue medications the same, reminded to take on an empty stomach 30-89mins before food.  -     TSH  Hyperlipidemia -continue medications, check lipids, decrease fatty foods, increase activity.  -     Lipid panel  Prediabetes Discussed general issues about diabetes pathophysiology and management., Educational material distributed., Suggested low cholesterol diet., Encouraged aerobic exercise., Discussed foot care., Reminded to get yearly retinal exam.  Medication management -     Magnesium  Anxiety continue medications, stress management techniques discussed, increase water, good sleep hygiene discussed, increase exercise, and increase veggies.   Insomnia, unspecified type -     ALPRAZolam (XANAX) 1 MG tablet; TAKE 1/2 TO 1 TABLET BY MOUTH 2 TO 3 TIMES DAILY ONLY AS NEEDED FOR ANXIETY  Vitamin D deficiency Continue supplement  Encounter for antineoplastic chemotherapy Cont. Follow up  Medicare annual wellness visit, subsequent 1 year follow up  Advanced care planning/counseling discussion Will bring in copies for scanning, discussed with patient   Future Appointments Date Time Provider [place]  07/16/2017 10:15 AM [facility] 1 [facility] None  07/16/2017 10:45 AM Curt Bears, MD [facility] None  09/26/2017 2:00 PM Vicie Mutters, PA-C [facility] None     Plan:   During the  course of the visit the patient was educated and counseled about appropriate screening and preventive services including:    Pneumococcal vaccine   Influenza vaccine  Td vaccine  Screening electrocardiogram  Bone densitometry screening  Colorectal cancer screening  Diabetes screening  Glaucoma screening  Nutrition counseling   Advanced directives: requested  Subjective:   Elizabeth [name] is a 71 y.o. female who presents for Medicare Annual Wellness Visit and complete physical.    She has had elevated blood pressure since 1997. Her blood pressure has been controlled at home, & today their BP is BP: 110/70 She does workout. She denies chest pain, shortness of breath, dizziness.  She has stage 4 adenocarcinoma of the lungs, follows Dr. Julien Nordmann, has been taking oral chemotherapy. She is on cholesterol medication and denies myalgias. Her cholesterol is at goal. The cholesterol last visit was:  Lab Results  Component Value Date   CHOL 157 03/08/2017   HDL 46 (L) 03/08/2017   LDLCALC 86 03/08/2017   TRIG 125 03/08/2017   CHOLHDL 3.4 03/08/2017   She has had prediabetes for 3 years (A1c 5.7% in 2012). She has been working on diet and exercise for prediabetes, and denies foot ulcerations, hyperglycemia, hypoglycemia , paresthesia of the feet, polydipsia, polyuria and visual disturbances. Last A1C in the office was:  Lab Results  Component Value Date   HGBA1C 5.3 03/08/2017   Patient is on Vitamin D supplement.   Lab Results  Component Value Date   VD25OH 100 03/08/2017     BMI is Body mass index is 28.98 kg/m., she is working on diet and exercise. Wt Readings from Last 3 Encounters:  06/14/17  148 lb 6.4 oz (67.3 kg)  06/04/17 150 lb 14.4 oz (68.4 kg)  04/24/17 149 lb 4.8 oz (67.7 kg)   She is on thyroid medication. Her medication was changed last visit.   Lab Results  Component Value Date   TSH 0.98 03/08/2017  .   Names of Other Physician/Practitioners you  currently use: 1. [facility] here for primary care 2. Dr Earl Gala , eye doctor, last visit Dec 2016 3. Dr Dalene Carrow, DDS, dentist, last visit March 2018  Patient Care Team: [name], MD as PCP - General (Internal Medicine) Sharol Roussel, [place] as Physician Assistant (Optometry) Jettie Booze, MD as Consulting Physician (Interventional Cardiology) Teena Irani, MD as Consulting Physician (Gastroenterology) Druscilla Brownie, MD as Consulting Physician (Dermatology) Clent Jacks, MD as Consulting Physician (Ophthalmology) Erroll Luna, MD as Consulting Physician (General Surgery)  Medication Review: Current Outpatient Prescriptions on File Prior to Visit  Medication Sig Dispense Refill  . aspirin 81 MG tablet Take 81 mg by mouth at bedtime.     . Cholecalciferol (VITAMIN D3) 10000 UNITS TABS Take by mouth.    . clobetasol (TEMOVATE) 0.05 % external solution   2  . coal tar (NEUTROGENA T-GEL) 0.5 % shampoo Apply topically at bedtime as needed.    . Cyanocobalamin (VITAMIN B 12 PO) Take 1 tablet by mouth daily.    . fluocinonide (LIDEX) 0.05 % external solution APP TO THE SCALP D PRN  2  . GILOTRIF 30 MG tablet TAKE 1 TABLET BY MOUTH ONCE DAILY. TAKE ON AN EMPTY STOMACH 1 HOUR BEFORE OR 2 HOURS AFTER MEALS 30 tablet 2  . HYDROcodone-homatropine (HYCODAN) 5-1.5 MG/5ML syrup Take 5 mLs by mouth every 6 (six) hours as needed for cough. 120 mL 0  . ibuprofen (ADVIL,MOTRIN) 200 MG tablet Take 200 mg by mouth every 6 (six) hours as needed.    Marland Kitchen ipratropium (ATROVENT) 0.03 % nasal spray Place 2 sprays into the nose 3 (three) times daily. 30 mL 2  . ketoconazole (NIZORAL) 2 % shampoo APP ON THE SKIN TWICE WEEKLY  3  . levothyroxine (SYNTHROID, LEVOTHROID) 50 MCG tablet TAKE 1 TABLET BY MOUTH EVERY DAY 90 tablet 0  . montelukast (SINGULAIR) 10 MG tablet TAKE 1 TABLET BY MOUTH EVERY DAY 90 tablet 3  . mupirocin ointment (BACTROBAN) 2 % PLACE 1  APPLICATION INTO THE NOSE 2 (TWO) TIMES DAILY. 22 g 0  . pyrithione zinc (HEAD AND SHOULDERS) 1 % shampoo     . sulfamethoxazole-trimethoprim (BACTRIM DS,SEPTRA DS) 800-160 MG tablet TK 1 T PO BID  1   No current facility-administered medications on file prior to visit.     Current Problems (verified) Patient Active Problem List   Diagnosis Date Noted  . Aortic atherosclerosis ([place]) 05/29/2016  . Encounter for antineoplastic chemotherapy 10/19/2015  . Adenocarcinoma of left lung, stage 4 ([place]) 04/05/2015  . Obesity 02/25/2015  . Medication management 11/03/2014  . Vitamin D deficiency 10/21/2013  . Hypothyroidism 10/21/2013  . Essential hypertension 10/21/2013  . Hyperlipidemia 10/21/2013  . Prediabetes   . Anxiety   . Insomnia    Screening Tests Immunization History  Administered Date(s) Administered  . Influenza, High Dose Seasonal PF 07/31/2014  . Influenza-Unspecified 09/13/2015, 07/23/2016  . Pneumococcal Conjugate-13 12/24/2015  . Pneumococcal Polysaccharide-23 07/22/2012  . Td 12/25/2005  . Zoster 01/09/2007   Preventative care: Last colonoscopy: Jan 2014 -recc 10 yr f/u [facility] 10/2015  Prior vaccinations: TD : 12/25/2005 DUE this year  Influenza: HD 07/2016 Pneumococcal: 2013 Prevnar: 2017 Shingles/Zostavax: 01/09/2007  Allergies Allergies  Allergen Reactions  . Penicillins Swelling and Rash    SURGICAL HISTORY She  has a past surgical history that includes Tonsillectomy and adenoidectomy and Abdominal hysterectomy (1995). FAMILY HISTORY Her family history includes ALS in her father; Alcohol abuse in her mother; Hypertension in her father; Liver disease in her mother. SOCIAL HISTORY She  reports that she quit smoking about 42 years ago. She has a 5.00 pack-year smoking history. She has never used smokeless tobacco. She reports that she does not drink alcohol or use drugs.  MEDICARE WELLNESS OBJECTIVES: Physical activity: Current Exercise Habits: Structured  exercise class, Type of exercise: strength training/weights;treadmill, Time (Minutes): 30, Frequency (Times/Week): 3, Weekly Exercise (Minutes/Week): 90, Intensity: Moderate Cardiac risk factors: Cardiac Risk Factors include: advanced age (>35men, >61 women);hypertension;dyslipidemia Depression/mood screen:   Depression screen [facility] 2/9 06/14/2017  Decreased Interest 0  Down, Depressed, Hopeless 0  PHQ - 2 Score 0    ADLs:  In your present state of health, do you have any difficulty performing the following activities: 06/14/2017 03/08/2017  Hearing? N N  Vision? N N  Difficulty concentrating or making decisions? N N  Walking or climbing stairs? N N  Dressing or bathing? N N  Doing errands, shopping? N N  Some recent data might be hidden     Cognitive Testing  Alert? Yes  Normal Appearance?Yes  Oriented to person? Yes  Place? Yes   Time? Yes  Recall of three objects?  Yes  Can perform simple calculations? Yes  Displays appropriate judgment?Yes  Can read the correct time from a watch face?Yes  EOL planning: Does Patient Have a Medical Advance Directive?: Yes Type of Advance Directive: Healthcare Power of Attorney, Living will Pr[place] in Chart?: No - copy requested     Objective:     Blood pressure 110/70, pulse 87, temperature (!) 97.4 F (36.3 C), resp. rate 16, height 5' (1.524 m), weight 148 lb 6.4 oz (67.3 kg), SpO2 98 %. Body mass index is 28.98 kg/m.  General appearance: alert, no distress, WD/WN, female HEENT: normocephalic, sclerae anicteric, TMs pearly, nares patent, no discharge or erythema, pharynx normal Oral cavity: MMM, no lesions Neck: supple, no lymphadenopathy, no thyromegaly, no masses Heart: RRR, normal S1, S2, no murmurs Lungs: CTA bilaterally, no wheezes, rhonchi, or rales Abdomen: +bs, soft, non tender, non distended, no masses, no hepatomegaly, no splenomegaly Musculoskeletal: nontender, no swelling, no obvious  deformity Extremities: no edema, no cyanosis, no clubbing Pulses: 2+ symmetric, upper and lower extremities, normal cap refill Neurological: alert, oriented x 3, CN2-12 intact, strength normal upper extremities and lower extremities, sensation normal throughout, DTRs 2+ throughout, no cerebellar signs, gait normal Psychiatric: normal affect, behavior normal, pleasant   Medicare Attestation I have personally reviewed: The patient's medical and social history Their use of alcohol, tobacco or illicit drugs Their current medications and supplements The patient's functional ability including ADLs,fall risks, home safety risks, cognitive, and hearing and visual impairment Diet and physical activities Evidence for depression or mood disorders  The patient's weight, height, BMI, and visual acuity have been recorded in the chart.  I have made referrals, counseling, and provided education to the patient based on review of the above and I have provided the patient with a written personalized care plan for preventive services.    Vicie Mutters, PA-C   06/14/2017

## 2017-06-13 MED FILL — GILOTRIF 30 MG TABLET: 30 | 30 days supply | Qty: 30 | Fill #1

## 2017-06-14 ENCOUNTER — Encounter: Payer: Self-pay | Admitting: Physician Assistant

## 2017-06-14 ENCOUNTER — Ambulatory Visit (INDEPENDENT_AMBULATORY_CARE_PROVIDER_SITE_OTHER): Payer: Medicare Other | Admitting: Physician Assistant

## 2017-06-14 VITALS — BP 110/70 | HR 87 | Temp 97.4°F | Resp 16 | Ht 60.0 in | Wt 148.4 lb

## 2017-06-14 DIAGNOSIS — G4700 Insomnia, unspecified: Secondary | ICD-10-CM | POA: Diagnosis not present

## 2017-06-14 DIAGNOSIS — Z7189 Other specified counseling: Secondary | ICD-10-CM | POA: Diagnosis not present

## 2017-06-14 DIAGNOSIS — E039 Hypothyroidism, unspecified: Secondary | ICD-10-CM | POA: Diagnosis not present

## 2017-06-14 DIAGNOSIS — Z5111 Encounter for antineoplastic chemotherapy: Secondary | ICD-10-CM | POA: Diagnosis not present

## 2017-06-14 DIAGNOSIS — R7303 Prediabetes: Secondary | ICD-10-CM

## 2017-06-14 DIAGNOSIS — I10 Essential (primary) hypertension: Secondary | ICD-10-CM | POA: Diagnosis not present

## 2017-06-14 DIAGNOSIS — Z79899 Other long term (current) drug therapy: Secondary | ICD-10-CM

## 2017-06-14 DIAGNOSIS — I700 Atherosclerosis of aorta: Secondary | ICD-10-CM

## 2017-06-14 DIAGNOSIS — E559 Vitamin D deficiency, unspecified: Secondary | ICD-10-CM | POA: Diagnosis not present

## 2017-06-14 DIAGNOSIS — F419 Anxiety disorder, unspecified: Secondary | ICD-10-CM

## 2017-06-14 DIAGNOSIS — E782 Mixed hyperlipidemia: Secondary | ICD-10-CM | POA: Diagnosis not present

## 2017-06-14 DIAGNOSIS — Z0000 Encounter for general adult medical examination without abnormal findings: Secondary | ICD-10-CM | POA: Diagnosis not present

## 2017-06-14 DIAGNOSIS — C3492 Malignant neoplasm of unspecified part of left bronchus or lung: Secondary | ICD-10-CM

## 2017-06-14 MED ORDER — ALPRAZOLAM 1 MG PO TABS: ORAL_TABLET | ORAL | 1 refills | Status: DC

## 2017-06-14 NOTE — Progress Notes (Signed)
Xanax was called into pharmacy on Aug 23rd 2018 at 12:03pm by DD

## 2017-06-14 NOTE — Patient Instructions (Signed)
Common causes of cough OR hoarseness OR sore throat:   Allergies, Viral Infections, Acid Reflux and Bacterial Infections.  1) Allergies and viral infections cause a cough OR sore throat by post nasal drip and are often worse at night, can also have sneezing, lower grade fevers, clear/yellow mucus. This is best treated with allergy medications or nasal sprays.  Please get on allegra for 1-2 weeks The strongest is allegra or fexafinadine  Cheapest at [employer], [employer], [employer]  2) Bacterial infections are more severe than allergies or viral infections with fever, teeth pain, fatigue. This can be treated with prednisone and the same over the counter medication and after 7 days can be treated with an antibiotic.   3) Silent reflux/GERD can cause a cough OR sore throat OR hoarseness WITHOUT heart burn because the esophagus that goes to the stomach and trachea that goes to the lungs are very close and when you lay down the acid can irritate your throat and lungs. This can cause hoarseness, cough, and wheezing. Please stop any alcohol or anti-inflammatories like aleve/advil/ibuprofen and start an over the counter Prilosec or omeprazole 1-2 times daily 93mins before food for 2 weeks, then switch to over the counter zantac/ratinidine or pepcid/famotadine once at night for 2 weeks.   4) sometimes irritation causes more irritation. Try voice rest, use sugar free cough drops to prevent coughing, and try to stop clearing your throat.   If you ever have a cough that does not go away after trying these things please make a follow up visit for further evaluation or we can refer you to a specialist. Or if you ever have shortness of breath or chest pain go to the ER.

## 2017-06-15 LAB — BASIC METABOLIC PANEL WITH GFR
BUN / CREAT RATIO: 12 (calc) (ref 6–22)
BUN: 12 mg/dL (ref 7–25)
CALCIUM: 8.9 mg/dL (ref 8.6–10.4)
CO2: 27 mmol/L (ref 20–32)
CREATININE: 1.03 mg/dL — AB (ref 0.60–0.93)
Chloride: 106 mmol/L (ref 98–110)
GFR, EST AFRICAN AMERICAN: 64 mL/min/{1.73_m2} (ref >=60)
GFR, EST NON AFRICAN AMERICAN: 55 mL/min/{1.73_m2} — AB (ref >=60)
Glucose, Bld: 69 mg/dL (ref 65–99)
Potassium: 4.1 mmol/L (ref 3.5–5.3)
Sodium: 142 mmol/L (ref 135–146)

## 2017-06-15 LAB — CBC WITH DIFFERENTIAL/PLATELET
BASOS PCT: 0.9 %
Basophils Absolute: 51 cells/uL (ref 0–200)
EOS ABS: 143 {cells}/uL (ref 15–500)
Eosinophils Relative: 2.5 %
HEMATOCRIT: 37.3 % (ref 35.0–45.0)
HEMOGLOBIN: 12.1 g/dL (ref 11.7–15.5)
LYMPHS ABS: 1180 {cells}/uL (ref 850–3900)
MCH: 27.8 pg (ref 27.0–33.0)
MCHC: 32.4 g/dL (ref 32.0–36.0)
MCV: 85.7 fL (ref 80.0–100.0)
MPV: 12.6 fL — AB (ref 7.5–12.5)
Monocytes Relative: 9.4 %
NEUTROS ABS: 3791 {cells}/uL (ref 1500–7800)
Neutrophils Relative %: 66.5 %
Platelets: 186 10*3/uL (ref 140–400)
RBC: 4.35 10*6/uL (ref 3.80–5.10)
RDW: 12.9 % (ref 11.0–15.0)
TOTAL LYMPHOCYTE: 20.7 %
WBC: 5.7 10*3/uL (ref 3.8–10.8)
WBCMIX: 536 {cells}/uL (ref 200–950)

## 2017-06-15 LAB — HEPATIC FUNCTION PANEL
AG RATIO: 1.6 (calc) (ref 1.0–2.5)
ALBUMIN MSPROF: 3.9 g/dL (ref 3.6–5.1)
ALT: 22 U/L (ref 6–29)
AST: 23 U/L (ref 10–35)
Alkaline phosphatase (APISO): 73 U/L (ref 33–130)
Bilirubin, Direct: 0.2 mg/dL (ref 0.0–0.2)
GLOBULIN: 2.5 g/dL (ref 1.9–3.7)
Indirect Bilirubin: 0.7 mg/dL (calc) (ref 0.2–1.2)
Total Bilirubin: 0.9 mg/dL (ref 0.2–1.2)
Total Protein: 6.4 g/dL (ref 6.1–8.1)

## 2017-06-15 LAB — LIPID PANEL
CHOL/HDL RATIO: 3.3 (calc) (ref <5.0)
CHOLESTEROL: 166 mg/dL (ref <200)
HDL: 50 mg/dL — AB (ref >50)
LDL CHOLESTEROL (CALC): 96 mg/dL
Non-HDL Cholesterol (Calc): 116 mg/dL (calc) (ref <130)
TRIGLYCERIDES: 105 mg/dL (ref <150)

## 2017-06-15 LAB — TSH: TSH: 2.32 mIU/L (ref 0.40–4.50)

## 2017-06-15 LAB — MAGNESIUM: Magnesium: 1.8 mg/dL (ref 1.5–2.5)

## 2017-06-26 ENCOUNTER — Encounter: Payer: Self-pay | Admitting: Internal Medicine

## 2017-07-11 MED FILL — GILOTRIF 30 MG TABLET: 30 | 30 days supply | Qty: 30 | Fill #2

## 2017-07-13 ENCOUNTER — Other Ambulatory Visit: Payer: Self-pay | Admitting: Physician Assistant

## 2017-07-16 ENCOUNTER — Other Ambulatory Visit (HOSPITAL_BASED_OUTPATIENT_CLINIC_OR_DEPARTMENT_OTHER): Payer: Medicare Other

## 2017-07-16 ENCOUNTER — Telehealth: Payer: Self-pay | Admitting: Internal Medicine

## 2017-07-16 ENCOUNTER — Ambulatory Visit (HOSPITAL_BASED_OUTPATIENT_CLINIC_OR_DEPARTMENT_OTHER): Payer: Medicare Other | Admitting: Internal Medicine

## 2017-07-16 ENCOUNTER — Encounter: Payer: Self-pay | Admitting: Internal Medicine

## 2017-07-16 VITALS — BP 122/79 | HR 79 | Temp 98.4°F | Resp 18 | Ht 60.0 in | Wt 149.4 lb

## 2017-07-16 DIAGNOSIS — Z5111 Encounter for antineoplastic chemotherapy: Secondary | ICD-10-CM

## 2017-07-16 DIAGNOSIS — L270 Generalized skin eruption due to drugs and medicaments taken internally: Secondary | ICD-10-CM

## 2017-07-16 DIAGNOSIS — C3492 Malignant neoplasm of unspecified part of left bronchus or lung: Secondary | ICD-10-CM

## 2017-07-16 DIAGNOSIS — C3432 Malignant neoplasm of lower lobe, left bronchus or lung: Secondary | ICD-10-CM

## 2017-07-16 DIAGNOSIS — L271 Localized skin eruption due to drugs and medicaments taken internally: Secondary | ICD-10-CM | POA: Diagnosis not present

## 2017-07-16 DIAGNOSIS — E039 Hypothyroidism, unspecified: Secondary | ICD-10-CM

## 2017-07-16 DIAGNOSIS — C7801 Secondary malignant neoplasm of right lung: Secondary | ICD-10-CM

## 2017-07-16 DIAGNOSIS — C7802 Secondary malignant neoplasm of left lung: Secondary | ICD-10-CM | POA: Diagnosis not present

## 2017-07-16 LAB — CBC WITH DIFFERENTIAL/PLATELET
BASO%: 0.8 % (ref 0.0–2.0)
Basophils Absolute: 0 10*3/uL (ref 0.0–0.1)
EOS%: 4.1 % (ref 0.0–7.0)
Eosinophils Absolute: 0.2 10*3/uL (ref 0.0–0.5)
HEMATOCRIT: 36.4 % (ref 34.8–46.6)
HGB: 11.8 g/dL (ref 11.6–15.9)
LYMPH%: 27 % (ref 14.0–49.7)
MCH: 28.2 pg (ref 25.1–34.0)
MCHC: 32.4 g/dL (ref 31.5–36.0)
MCV: 86.9 fL (ref 79.5–101.0)
MONO#: 0.4 10*3/uL (ref 0.1–0.9)
MONO%: 8.5 % (ref 0.0–14.0)
NEUT%: 59.6 % (ref 38.4–76.8)
NEUTROS ABS: 2.9 10*3/uL (ref 1.5–6.5)
Platelets: 169 10*3/uL (ref 145–400)
RBC: 4.19 10*6/uL (ref 3.70–5.45)
RDW: 13.4 % (ref 11.2–14.5)
WBC: 4.9 10*3/uL (ref 3.9–10.3)
lymph#: 1.3 10*3/uL (ref 0.9–3.3)

## 2017-07-16 LAB — COMPREHENSIVE METABOLIC PANEL
ALT: 27 U/L (ref 0–55)
ANION GAP: 8 meq/L (ref 3–11)
AST: 24 U/L (ref 5–34)
Albumin: 3.4 g/dL — ABNORMAL LOW (ref 3.5–5.0)
Alkaline Phosphatase: 71 U/L (ref 40–150)
BILIRUBIN TOTAL: 0.75 mg/dL (ref 0.20–1.20)
BUN: 16.3 mg/dL (ref 7.0–26.0)
CALCIUM: 9 mg/dL (ref 8.4–10.4)
CO2: 25 meq/L (ref 22–29)
CREATININE: 1 mg/dL (ref 0.6–1.1)
Chloride: 108 mEq/L (ref 98–109)
EGFR: 60 mL/min/{1.73_m2} — ABNORMAL LOW (ref >90)
Glucose: 65 mg/dl — ABNORMAL LOW (ref 70–140)
Potassium: 4 mEq/L (ref 3.5–5.1)
Sodium: 141 mEq/L (ref 136–145)
TOTAL PROTEIN: 6.5 g/dL (ref 6.4–8.3)

## 2017-07-16 NOTE — Progress Notes (Signed)
[facility] [phone]   Fax:([phone]  OFFICE PROGRESS NOTE  [name], MD 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  CURRENT THERAPY: Gilotrif 30 mg by mouth daily started 02/21/2016 status post 16 months of treatment.  INTERVAL HISTORY: Elizabeth [name] 71 y.o. female returns to the clinic today for follow-up visit. The patient is feeling fine today with no specific complaints. The rash in her head is much better as the patient has been on Bactrim for the last 2 weeks. She denied having any chest pain, shortness breath, cough or hemoptysis. She denied having any weight loss or night sweats. She has no nausea, vomiting, diarrhea or constipation. She is here today for evaluation and repeat blood work.  MEDICAL HISTORY: Past Medical History:  Diagnosis Date  . Adenocarcinoma of left lung, stage 4 ([place]) 04/05/2015   Biopsy confirmed CT SCAN: There are innumerable bilateral pulmonary nodules. These range in size from about 5 mm to 2 cm. They demonstrate irregular indistinct borders, the larger ones demonstrating spiculation. PET SCAN: Diffuse pulmonary metastatic disease with a 4 cm dominant left lower low lung lesion. No enlarged or hypermetabolic mediastinal or hilar adenopathy.  . Allergy   . Anxiety   . Cancer ([place])   . Drug-induced skin rash 01/17/2016  . Hyperlipidemia   . Hypertension   . Insomnia   . Prediabetes   . Thyroid disease     ALLERGIES:  is allergic to penicillins.  MEDICATIONS:  Current Outpatient  Prescriptions  Medication Sig Dispense Refill  . ALPRAZolam (XANAX) 1 MG tablet TAKE 1/2 TO 1 TABLET BY MOUTH 2 TO 3 TIMES DAILY ONLY AS NEEDED FOR ANXIETY 90 tablet 1  . aspirin 81 MG tablet Take 81 mg by mouth at bedtime.     . Cholecalciferol (VITAMIN D3) 10000 UNITS TABS Take by mouth.    . clobetasol (TEMOVATE) 0.05 % external solution   2  . coal tar (NEUTROGENA T-GEL) 0.5 % shampoo Apply topically at bedtime as needed.    . Cyanocobalamin (VITAMIN B 12 PO) Take 1 tablet by mouth daily.    . fluocinonide (LIDEX) 0.05 % external solution APP TO THE SCALP D PRN  2  . GILOTRIF 30 MG tablet TAKE 1 TABLET BY MOUTH ONCE DAILY. TAKE ON AN EMPTY STOMACH 1 HOUR BEFORE OR 2 HOURS AFTER MEALS 30 tablet 2  . HYDROcodone-homatropine (HYCODAN) 5-1.5 MG/5ML syrup Take 5 mLs by mouth every 6 (six) hours as needed for cough. 120 mL 0  . ibuprofen (ADVIL,MOTRIN) 200 MG tablet Take 200 mg by mouth every 6 (six) hours as needed.    Marland Kitchen ipratropium (ATROVENT) 0.03 % nasal spray Place 2 sprays into the nose 3 (three) times daily. 30 mL 2  . ketoconazole (NIZORAL) 2 % shampoo APP ON THE SKIN TWICE WEEKLY  3  . levothyroxine (SYNTHROID, LEVOTHROID) 50 MCG tablet TAKE 1 TABLET BY MOUTH EVERY DAY 90 tablet 1  . montelukast (SINGULAIR) 10 MG tablet TAKE 1 TABLET BY MOUTH EVERY DAY 90 tablet 3  . mupirocin ointment (BACTROBAN) 2 %  PLACE 1 APPLICATION INTO THE NOSE 2 (TWO) TIMES DAILY. 22 g 0  . pyrithione zinc (HEAD AND SHOULDERS) 1 % shampoo     . sulfamethoxazole-trimethoprim (BACTRIM DS,SEPTRA DS) 800-160 MG tablet TK 1 T PO BID  1   No current facility-administered medications for this visit.     SURGICAL HISTORY:  Past Surgical History:  Procedure Laterality Date  . ABDOMINAL HYSTERECTOMY  1995   w BSO  . TONSILLECTOMY AND ADENOIDECTOMY      REVIEW OF SYSTEMS:  A comprehensive review of systems was negative.   PHYSICAL EXAMINATION: General appearance: alert, cooperative and no distress Head:  Normocephalic, without obvious abnormality, atraumatic Neck: no adenopathy, no JVD, supple, symmetrical, trachea midline and thyroid not enlarged, symmetric, no tenderness/mass/nodules Lymph nodes: Cervical, supraclavicular, and axillary nodes normal. Resp: clear to auscultation bilaterally Back: symmetric, no curvature. ROM normal. No CVA tenderness. Cardio: regular rate and rhythm, S1, S2 normal, no murmur, click, rub or gallop GI: soft, non-tender; bowel sounds normal; no masses,  no organomegaly Extremities: extremities normal, atraumatic, no cyanosis or edema  ECOG PERFORMANCE STATUS: 0 - Asymptomatic  Blood pressure 122/79, pulse 79, temperature 98.4 F (36.9 C), temperature source Oral, resp. rate 18, height 5' (1.524 m), weight 149 lb 6.4 oz (67.8 kg), SpO2 99 %.  LABORATORY DATA: Lab Results  Component Value Date   WBC 4.9 07/16/2017   HGB 11.8 07/16/2017   HCT 36.4 07/16/2017   MCV 86.9 07/16/2017   PLT 169 07/16/2017      Chemistry      Component Value Date/Time   NA 141 07/16/2017 1005   K 4.0 07/16/2017 1005   CL 106 06/14/2017 1116   CO2 25 07/16/2017 1005   BUN 16.3 07/16/2017 1005   CREATININE 1.0 07/16/2017 1005      Component Value Date/Time   CALCIUM 9.0 07/16/2017 1005   ALKPHOS 71 07/16/2017 1005   AST 24 07/16/2017 1005   ALT 27 07/16/2017 1005   BILITOT 0.75 07/16/2017 1005       RADIOGRAPHIC STUDIES: No results found.  ASSESSMENT AND PLAN:  This is a very pleasant 71 years old white female with stage IV non-small cell lung cancer, adenocarcinoma with positive EGFR mutation with deletion in next 66 and currently undergoing treatment with Gilotrif initially at a dose of 40 mg for 9 months followed by 30 mg by mouth daily status post 16 months.  She continues to tolerate her treatment fairly well with no significant adverse effects. I recommended for the patient to continue Gilotrif with the same dose. I will see her back for follow-up visit in  6 weeks for reevaluation with repeat blood work. She was advised to call immediately if she has any concerning symptoms in the interval. The patient voices understanding of current disease status and treatment options and is in agreement with the current care plan. All questions were answered. The patient knows to call the clinic with any problems, questions or concerns. We can certainly see the patient much sooner if necessary.  Disclaimer: This note was dictated with voice recognition software. Similar sounding words can inadvertently be transcribed and may not be corrected upon review.

## 2017-07-16 NOTE — Telephone Encounter (Signed)
Scheduled appt per 9/24 los - Gave patient AVS and calender per los.

## 2017-07-20 ENCOUNTER — Encounter: Payer: Self-pay | Admitting: Adult Health

## 2017-07-20 ENCOUNTER — Ambulatory Visit (INDEPENDENT_AMBULATORY_CARE_PROVIDER_SITE_OTHER): Payer: Medicare Other | Admitting: Adult Health

## 2017-07-20 ENCOUNTER — Other Ambulatory Visit: Payer: Self-pay | Admitting: Adult Health

## 2017-07-20 VITALS — BP 110/74 | HR 80 | Temp 97.9°F | Resp 16 | Ht 60.0 in | Wt 146.4 lb

## 2017-07-20 DIAGNOSIS — R8290 Unspecified abnormal findings in urine: Secondary | ICD-10-CM | POA: Diagnosis not present

## 2017-07-20 DIAGNOSIS — N39498 Other specified urinary incontinence: Secondary | ICD-10-CM | POA: Diagnosis not present

## 2017-07-20 NOTE — Progress Notes (Signed)
Assessment and Plan:  Elizabeth [name] was seen today for urinary tract infection.  Diagnoses and all orders for this visit:  Cloudy urine -     Urinalysis w microscopic + reflex cultur  Other urinary incontinence      -       New onset nocturnal incontinent and stress incontinence   Urine visibly cloudy and discolored in office today. Will have the patient start taking Bactrim DS BID pending cultures; continue pushing water. ER precautions given with new systemic symptoms or blood in urine. Plan to follow up with urine culture in 1 months time. Patient is to monitor incontinence and notify us should this continue/not resolve with treatment.    Further disposition pending results of labs. Discussed med's effects and SE's.   Over 30 minutes of exam, counseling, chart review, and critical decision making was performed.   Future Appointments Date Time Provider [place]  08/03/2017 11:00 AM [facility] None  08/27/2017 10:15 AM [facility] 2 [facility] None  08/27/2017 10:45 AM Curt Bears, MD [facility] None  09/26/2017 2:00 PM Vicie Mutters, PA-C [facility] None    ------------------------------------------------------------------------------------------------------------------   HPI 71 y.o.female presents for suprapubic pressure onset yesterday, she reports her urine is now murky, she describes a grey, "swampy" color. The patient reports she had an episode of noctural incontinence yesterday, denies previous episodes of noctural incontinence, but reports she reports several incidences recently where she "leaked" when she bent over in the past week. Endorses sense of incomplete emptying with urination, frequency, fatigue. Denies dysuria, urgency, vaginal discharge, itching, rashes, fever or chills. Denies flank/back pain, N/V/D abdominal pain, changes in stool.   She drinks 3x 32 ounces of water daily.     The patient had labs drawn 4 days ago:    Component  Value Date/Time   WBC 4.9 07/16/2017 1005   WBC 5.7 06/14/2017 1116   RBC 4.19 07/16/2017 1005   RBC 4.35 06/14/2017 1116   HGB 11.8 07/16/2017 1005   HCT 36.4 07/16/2017 1005   PLT 169 07/16/2017 1005   MCV 86.9 07/16/2017 1005   MCH 28.2 07/16/2017 1005   MCH 27.8 06/14/2017 1116   MCHC 32.4 07/16/2017 1005   MCHC 32.4 06/14/2017 1116   RDW 13.4 07/16/2017 1005   LYMPHSABS 1.3 07/16/2017 1005   MONOABS 0.4 07/16/2017 1005   EOSABS 0.2 07/16/2017 1005   BASOSABS 0.0 07/16/2017 1005   CMP     Component Value Date/Time   NA 141 07/16/2017 1005   K 4.0 07/16/2017 1005   CL 106 06/14/2017 1116   CO2 25 07/16/2017 1005   GLUCOSE 65 (L) 07/16/2017 1005   BUN 16.3 07/16/2017 1005   CREATININE 1.0 07/16/2017 1005   CALCIUM 9.0 07/16/2017 1005   PROT 6.5 07/16/2017 1005   ALBUMIN 3.4 (L) 07/16/2017 1005   AST 24 07/16/2017 1005   ALT 27 07/16/2017 1005   ALKPHOS 71 07/16/2017 1005   BILITOT 0.75 07/16/2017 1005   GFRNONAA 55 (L) 06/14/2017 1116   GFRAA 64 06/14/2017 1116    Past Medical History:  Diagnosis Date  . Adenocarcinoma of left lung, stage 4 ([place]) 04/05/2015   Biopsy confirmed CT SCAN: There are innumerable bilateral pulmonary nodules. These range in size from about 5 mm to 2 cm. They demonstrate irregular indistinct borders, the larger ones demonstrating spiculation. PET SCAN: Diffuse pulmonary metastatic disease with a 4 cm dominant left lower low lung lesion. No enlarged or hypermetabolic mediastinal or hilar adenopathy.  Marland Kitchen  Allergy   . Anxiety   . Cancer ([place])   . Drug-induced skin rash 01/17/2016  . Hyperlipidemia   . Hypertension   . Insomnia   . Prediabetes   . Thyroid disease      Allergies  Allergen Reactions  . Penicillins Swelling and Rash    Current Outpatient Prescriptions on File Prior to Visit  Medication Sig  . ALPRAZolam (XANAX) 1 MG tablet TAKE 1/2 TO 1 TABLET BY MOUTH 2 TO 3 TIMES DAILY ONLY AS NEEDED FOR ANXIETY  . aspirin 81 MG tablet  Take 81 mg by mouth at bedtime.   . Cholecalciferol (VITAMIN D3) 10000 UNITS TABS Take by mouth.  . clobetasol (TEMOVATE) 0.05 % external solution   . coal tar (NEUTROGENA T-GEL) 0.5 % shampoo Apply topically at bedtime as needed.  . Cyanocobalamin (VITAMIN B 12 PO) Take 1 tablet by mouth daily.  . fluocinonide (LIDEX) 0.05 % external solution APP TO THE SCALP D PRN  . GILOTRIF 30 MG tablet TAKE 1 TABLET BY MOUTH ONCE DAILY. TAKE ON AN EMPTY STOMACH 1 HOUR BEFORE OR 2 HOURS AFTER MEALS  . HYDROcodone-homatropine (HYCODAN) 5-1.5 MG/5ML syrup Take 5 mLs by mouth every 6 (six) hours as needed for cough.  Marland Kitchen ibuprofen (ADVIL,MOTRIN) 200 MG tablet Take 200 mg by mouth every 6 (six) hours as needed.  Marland Kitchen ipratropium (ATROVENT) 0.03 % nasal spray Place 2 sprays into the nose 3 (three) times daily.  Marland Kitchen ketoconazole (NIZORAL) 2 % shampoo APP ON THE SKIN TWICE WEEKLY  . levothyroxine (SYNTHROID, LEVOTHROID) 50 MCG tablet TAKE 1 TABLET BY MOUTH EVERY DAY  . montelukast (SINGULAIR) 10 MG tablet TAKE 1 TABLET BY MOUTH EVERY DAY  . mupirocin ointment (BACTROBAN) 2 % PLACE 1 APPLICATION INTO THE NOSE 2 (TWO) TIMES DAILY.  Marland Kitchen pyrithione zinc (HEAD AND SHOULDERS) 1 % shampoo   . sulfamethoxazole-trimethoprim (BACTRIM DS,SEPTRA DS) 800-160 MG tablet TK 1 T PO BID   No current facility-administered medications on file prior to visit.     ROS: all negative except above.   Physical Exam:  BP 110/74   Pulse 80   Temp 97.9 F (36.6 C)   Resp 16   Ht 5' (1.524 m)   Wt 146 lb 6.4 oz (66.4 kg)   BMI 28.59 kg/m   General Appearance: Well nourished, in no apparent distress. Neck: Supple, thyroid normal.  Respiratory: Respiratory effort normal, BS equal bilaterally without rales, rhonchi, wheezing or stridor.  Cardio: RRR with no MRGs. Brisk peripheral pulses without edema.  Abdomen: Soft, + BS.  Non tender, no guarding, rebound, hernias, masses. Bladder not palpable or tender.  Lymphatics: Non tender without  lymphadenopathy.  Musculoskeletal: Full ROM, 5/5 strength, normal gait.  Skin: Warm, dry without rashes, lesions, ecchymosis.  Neuro: Cranial nerves intact. Normal muscle tone, no cerebellar symptoms. Sensation intact.  Psych: Awake and oriented X 3, normal affect, Insight and Judgment appropriate.     Izora Ribas, NP 10:20 AM [facility]

## 2017-07-20 NOTE — Patient Instructions (Signed)
If you see lots of blood in your urine, have chills, fever, back/flank achiness, or develop abdominal pain go to the ER.   Acute Urinary Retention, Female Acute urinary retention is the temporary inability to urinate. This is an uncommon problem in women. It can be caused by:  Infection.  A side effect of a medicine.  A problem in a nearby organ that presses or squeezes on the bladder or the urethra (the tube that drains the bladder).  Psychological problems.  Surgery on your bladder, urethra, or pelvic organs that causes obstruction to the outflow of urine from your bladder.  Follow these instructions at home: If you are sent home with a Foley catheter and a drainage system, you will need to discuss the best course of action with your health care provider. While the catheter is in, maintain a good intake of fluids. Keep the drainage bag emptied and lower than your catheter. This is so that contaminated urine will not flow back into your bladder, which could lead to a urinary tract infection. There are two main types of drainage bags. One is a large bag that usually is used at night. It has a good capacity that will allow you to sleep through the night without having to empty it. The second type is called a leg bag. It has a smaller capacity so it needs to be emptied more frequently. However, the main advantage is that it can be attached by a leg strap and goes underneath your clothing, allowing you the freedom to move about or leave your home. Only take over-the-counter or prescription medicines for pain, discomfort, or fever as directed by your health care provider. Contact a health care provider if:  You develop a low-grade fever.  You experience spasms or leakage of urine with the spasms. Get help right away if:  You develop chills or fever.  Your catheter stops draining urine.  Your catheter falls out.  You start to develop increased bleeding that does not respond to rest and  increased fluid intake. This information is not intended to replace advice given to you by your health care provider. Make sure you discuss any questions you have with your health care provider. Document Released: 10/08/2006 Document Revised: 03/22/2016 Document Reviewed: 03/20/2013 Elsevier Interactive Patient Education  2017 [employer].

## 2017-07-23 ENCOUNTER — Other Ambulatory Visit: Payer: Self-pay | Admitting: Adult Health

## 2017-07-23 DIAGNOSIS — N3001 Acute cystitis with hematuria: Secondary | ICD-10-CM

## 2017-07-23 LAB — URINALYSIS W MICROSCOPIC + REFLEX CULTURE
BILIRUBIN URINE: NEGATIVE
Glucose, UA: NEGATIVE
Hyaline Cast: NONE SEEN /LPF
KETONES UR: NEGATIVE
NITRITES URINE, INITIAL: POSITIVE — AB
SQUAMOUS EPITHELIAL / LPF: NONE SEEN /HPF (ref <=5)
Specific Gravity, Urine: 1.013 (ref 1.001–1.03)
pH: 6 (ref 5.0–8.0)

## 2017-07-23 LAB — URINE CULTURE
MICRO NUMBER:: 81083464
SPECIMEN QUALITY:: ADEQUATE

## 2017-07-23 LAB — CULTURE INDICATED

## 2017-07-23 MED ORDER — CIPROFLOXACIN HCL 500 MG PO TABS: 500.0000 mg | ORAL_TABLET | Freq: Two times a day (BID) | ORAL | 0 refills | Status: AC

## 2017-08-02 ENCOUNTER — Other Ambulatory Visit: Payer: Self-pay | Admitting: Internal Medicine

## 2017-08-03 ENCOUNTER — Other Ambulatory Visit: Payer: Medicare Other

## 2017-08-03 DIAGNOSIS — N3001 Acute cystitis with hematuria: Secondary | ICD-10-CM

## 2017-08-04 LAB — URINE CULTURE
MICRO NUMBER:: 81140618
SPECIMEN QUALITY: ADEQUATE

## 2017-08-08 MED FILL — GILOTRIF 30 MG TABLET: 30 | 30 days supply | Qty: 30 | Fill #0

## 2017-08-18 ENCOUNTER — Other Ambulatory Visit: Payer: Self-pay | Admitting: Internal Medicine

## 2017-08-22 ENCOUNTER — Other Ambulatory Visit: Payer: Self-pay | Admitting: *Deleted

## 2017-08-22 MED ORDER — IPRATROPIUM BROMIDE 0.03 % NA SOLN: 2.0000 | Freq: Three times a day (TID) | NASAL | 3 refills | Status: DC

## 2017-08-22 MED FILL — IPRATROPIUM 0.03% SPRAY: 0.03 | 30 days supply | Qty: 60 | Fill #0

## 2017-08-27 ENCOUNTER — Ambulatory Visit (HOSPITAL_BASED_OUTPATIENT_CLINIC_OR_DEPARTMENT_OTHER): Payer: Medicare Other | Admitting: Internal Medicine

## 2017-08-27 ENCOUNTER — Encounter: Payer: Self-pay | Admitting: Internal Medicine

## 2017-08-27 ENCOUNTER — Telehealth: Payer: Self-pay | Admitting: Internal Medicine

## 2017-08-27 ENCOUNTER — Other Ambulatory Visit (HOSPITAL_BASED_OUTPATIENT_CLINIC_OR_DEPARTMENT_OTHER): Payer: Medicare Other

## 2017-08-27 DIAGNOSIS — C7801 Secondary malignant neoplasm of right lung: Secondary | ICD-10-CM

## 2017-08-27 DIAGNOSIS — C3492 Malignant neoplasm of unspecified part of left bronchus or lung: Secondary | ICD-10-CM

## 2017-08-27 DIAGNOSIS — C3490 Malignant neoplasm of unspecified part of unspecified bronchus or lung: Secondary | ICD-10-CM

## 2017-08-27 DIAGNOSIS — C3432 Malignant neoplasm of lower lobe, left bronchus or lung: Secondary | ICD-10-CM | POA: Diagnosis not present

## 2017-08-27 DIAGNOSIS — Z5111 Encounter for antineoplastic chemotherapy: Secondary | ICD-10-CM

## 2017-08-27 DIAGNOSIS — C7802 Secondary malignant neoplasm of left lung: Secondary | ICD-10-CM | POA: Diagnosis not present

## 2017-08-27 LAB — COMPREHENSIVE METABOLIC PANEL
ALT: 19 U/L (ref 0–55)
ANION GAP: 9 meq/L (ref 3–11)
AST: 21 U/L (ref 5–34)
Albumin: 3.6 g/dL (ref 3.5–5.0)
Alkaline Phosphatase: 68 U/L (ref 40–150)
BUN: 16.4 mg/dL (ref 7.0–26.0)
CALCIUM: 9.1 mg/dL (ref 8.4–10.4)
CHLORIDE: 107 meq/L (ref 98–109)
CO2: 26 meq/L (ref 22–29)
Creatinine: 0.9 mg/dL (ref 0.6–1.1)
Glucose: 62 mg/dl — ABNORMAL LOW (ref 70–140)
POTASSIUM: 4.2 meq/L (ref 3.5–5.1)
Sodium: 143 mEq/L (ref 136–145)
Total Bilirubin: 0.9 mg/dL (ref 0.20–1.20)
Total Protein: 7.1 g/dL (ref 6.4–8.3)

## 2017-08-27 LAB — CBC WITH DIFFERENTIAL/PLATELET
BASO%: 0.3 % (ref 0.0–2.0)
BASOS ABS: 0 10*3/uL (ref 0.0–0.1)
EOS ABS: 0.2 10*3/uL (ref 0.0–0.5)
EOS%: 3.2 % (ref 0.0–7.0)
HEMATOCRIT: 39.7 % (ref 34.8–46.6)
HEMOGLOBIN: 12.9 g/dL (ref 11.6–15.9)
LYMPH#: 1.4 10*3/uL (ref 0.9–3.3)
LYMPH%: 22.6 % (ref 14.0–49.7)
MCH: 28.4 pg (ref 25.1–34.0)
MCHC: 32.5 g/dL (ref 31.5–36.0)
MCV: 87.3 fL (ref 79.5–101.0)
MONO#: 0.5 10*3/uL (ref 0.1–0.9)
MONO%: 8.2 % (ref 0.0–14.0)
NEUT%: 65.7 % (ref 38.4–76.8)
NEUTROS ABS: 4.1 10*3/uL (ref 1.5–6.5)
Platelets: 177 10*3/uL (ref 145–400)
RBC: 4.55 10*6/uL (ref 3.70–5.45)
RDW: 13.2 % (ref 11.2–14.5)
WBC: 6.3 10*3/uL (ref 3.9–10.3)

## 2017-08-27 NOTE — Progress Notes (Signed)
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  CURRENT THERAPY: Gilotrif 30 mg by mouth daily started 02/21/2016 status post 18 months of treatment.  INTERVAL HISTORY: Elizabeth [name] 71 y.o. female returns to the clinic today for 6 weeks follow-up visit.  The patient is feeling fine today with no specific complaints.  She denied having any chest pain, shortness of breath, cough or hemoptysis.  She denied having any weight loss or night sweats.  She has no nausea, vomiting, diarrhea or constipation.  She continues to tolerate her treatment with Gilotrif fairly well. She is here today for evaluation and repeat blood work.  MEDICAL HISTORY: Past Medical History:  Diagnosis Date  . Adenocarcinoma of left lung, stage 4 ([place]) 04/05/2015   Biopsy confirmed CT SCAN: There are innumerable bilateral pulmonary nodules. These range in size from about 5 mm to 2 cm. They demonstrate irregular indistinct borders, the larger ones demonstrating spiculation. PET SCAN: Diffuse pulmonary metastatic disease with a 4 cm dominant left lower low lung lesion. No enlarged or hypermetabolic mediastinal or hilar adenopathy.  . Allergy   . Anxiety   . Cancer ([place])   . Drug-induced skin rash 01/17/2016  . Hyperlipidemia   . Hypertension   . Insomnia   . Prediabetes   . Thyroid disease     ALLERGIES:  is allergic to penicillins.  MEDICATIONS:  Current Outpatient  Medications  Medication Sig Dispense Refill  . ALPRAZolam (XANAX) 1 MG tablet TAKE 1/2 TO 1 TABLET BY MOUTH 2 TO 3 TIMES DAILY ONLY AS NEEDED FOR ANXIETY 90 tablet 1  . aspirin 81 MG tablet Take 81 mg by mouth at bedtime.     . Cholecalciferol (VITAMIN D3) 10000 UNITS TABS Take by mouth.    . clobetasol (TEMOVATE) 0.05 % external solution   2  . coal tar (NEUTROGENA T-GEL) 0.5 % shampoo Apply topically at bedtime as needed.    . Cyanocobalamin (VITAMIN B 12 PO) Take 1 tablet by mouth daily.    . fluocinonide (LIDEX) 0.05 % external solution APP TO THE SCALP D PRN  2  . GILOTRIF 30 MG tablet TAKE 1 TABLET BY MOUTH ONCE DAILY. TAKE ON AN EMPTY STOMACH 1 HOUR BEFORE OR 2 HOURS AFTER MEALS 30 tablet 2  . HYDROcodone-homatropine (HYCODAN) 5-1.5 MG/5ML syrup Take 5 mLs by mouth every 6 (six) hours as needed for cough. 120 mL 0  . ibuprofen (ADVIL,MOTRIN) 200 MG tablet Take 200 mg by mouth every 6 (six) hours as needed.    Marland Kitchen ipratropium (ATROVENT) 0.03 % nasal spray Place 2 sprays into the nose 3 (three) times daily. 60 mL 3  . ketoconazole (NIZORAL) 2 % shampoo APP ON THE SKIN TWICE WEEKLY  3  . levothyroxine (SYNTHROID, LEVOTHROID) 50 MCG tablet TAKE 1 TABLET BY MOUTH EVERY DAY 90 tablet 1  . montelukast (SINGULAIR) 10 MG tablet TAKE 1 TABLET BY MOUTH EVERY DAY 90 tablet 1  . mupirocin ointment (BACTROBAN) 2 % PLACE  1 APPLICATION INTO THE NOSE 2 (TWO) TIMES DAILY. 22 g 0  . pyrithione zinc (HEAD AND SHOULDERS) 1 % shampoo     . sulfamethoxazole-trimethoprim (BACTRIM DS,SEPTRA DS) 800-160 MG tablet TK 1 T PO BID  1   No current facility-administered medications for this visit.     SURGICAL HISTORY:  Past Surgical History:  Procedure Laterality Date  . ABDOMINAL HYSTERECTOMY  1995   w BSO  . TONSILLECTOMY AND ADENOIDECTOMY      REVIEW OF SYSTEMS:  A comprehensive review of systems was negative.   PHYSICAL EXAMINATION: General appearance: alert, cooperative and no distress Head:  Normocephalic, without obvious abnormality, atraumatic Neck: no adenopathy, no JVD, supple, symmetrical, trachea midline and thyroid not enlarged, symmetric, no tenderness/mass/nodules Lymph nodes: Cervical, supraclavicular, and axillary nodes normal. Resp: clear to auscultation bilaterally Back: symmetric, no curvature. ROM normal. No CVA tenderness. Cardio: regular rate and rhythm, S1, S2 normal, no murmur, click, rub or gallop GI: soft, non-tender; bowel sounds normal; no masses,  no organomegaly Extremities: extremities normal, atraumatic, no cyanosis or edema  ECOG PERFORMANCE STATUS: 0 - Asymptomatic  Blood pressure (!) 148/83, pulse 100, temperature 97.8 F (36.6 C), temperature source Oral, resp. rate 18, height 5' (1.524 m), weight 153 lb 9.6 oz (69.7 kg), SpO2 100 %.  LABORATORY DATA: Lab Results  Component Value Date   WBC 6.3 08/27/2017   HGB 12.9 08/27/2017   HCT 39.7 08/27/2017   MCV 87.3 08/27/2017   PLT 177 08/27/2017      Chemistry      Component Value Date/Time   NA 143 08/27/2017 1026   K 4.2 08/27/2017 1026   CL 106 06/14/2017 1116   CO2 26 08/27/2017 1026   BUN 16.4 08/27/2017 1026   CREATININE 0.9 08/27/2017 1026      Component Value Date/Time   CALCIUM 9.1 08/27/2017 1026   ALKPHOS 68 08/27/2017 1026   AST 21 08/27/2017 1026   ALT 19 08/27/2017 1026   BILITOT 0.90 08/27/2017 1026       RADIOGRAPHIC STUDIES: No results found.  ASSESSMENT AND PLAN:  This is a very pleasant 71 years old white female with stage IV non-small cell lung cancer, adenocarcinoma with positive EGFR mutation with deletion in next 37 and currently undergoing treatment with Gilotrif initially at a dose of 40 mg for 9 months followed by 30 mg by mouth daily status post 18 months.  The patient tolerated the last 6 weeks of her treatment fairly well with no significant adverse effects. I recommended for her to continue her current treatment with Gilotrif at 30 mg p.o. Daily. I  will see her back for follow-up visit in 2 months for evaluation after repeating CT scan of the chest for restaging of her disease. The patient was advised to call immediately if she has any concerning symptoms in the interval. The patient voices understanding of current disease status and treatment options and is in agreement with the current care plan. All questions were answered. The patient knows to call the clinic with any problems, questions or concerns. We can certainly see the patient much sooner if necessary.  Disclaimer: This note was dictated with voice recognition software. Similar sounding words can inadvertently be transcribed and may not be corrected upon review.

## 2017-08-27 NOTE — Telephone Encounter (Signed)
Scheduled appt per 1/15 los - Gave patient AVS and calender per los.  

## 2017-09-07 MED FILL — GILOTRIF 30 MG TABLET: 30 | 30 days supply | Qty: 30 | Fill #1

## 2017-09-25 DIAGNOSIS — R239 Unspecified skin changes: Secondary | ICD-10-CM

## 2017-09-25 DIAGNOSIS — T451X5A Adverse effect of antineoplastic and immunosuppressive drugs, initial encounter: Secondary | ICD-10-CM | POA: Insufficient documentation

## 2017-09-25 DIAGNOSIS — R234 Changes in skin texture: Secondary | ICD-10-CM

## 2017-09-25 HISTORY — DX: Adverse effect of antineoplastic and immunosuppressive drugs, initial encounter: R23.9

## 2017-09-25 HISTORY — DX: Adverse effect of antineoplastic and immunosuppressive drugs, initial encounter: T45.1X5A

## 2017-09-25 NOTE — Progress Notes (Signed)
Complete Physical  Assessment and Plan:  Essential hypertension - continue medications, DASH diet, exercise and monitor at home. Call if greater than 130/80.  -     CBC with Differential/Platelet -     BASIC METABOLIC PANEL WITH GFR -     Hepatic function panel -     TSH -     Urinalysis, Routine w reflex microscopic -     Microalbumin / creatinine urine ratio -     EKG 12-Lead  Aortic atherosclerosis (HCC) Control blood pressure, cholesterol, glucose, increase exercise.  -     Lipid panel  Adenocarcinoma of left lung, stage 4 (HCC) Follow up   Acquired hypothyroidism -     TSH Hypothyroidism-check TSH level, continue medications the same, reminded to take on an empty stomach 30-64mins before food.   Hyperlipidemia -     Lipid panel -continue medications, check lipids, decrease fatty foods, increase activity.   Medication management -     Magnesium  Encounter for antineoplastic chemotherapy Continue follow up  Anxiety continue medications, stress management techniques discussed, increase water, good sleep hygiene discussed, increase exercise, and increase veggies.   Insomnia, unspecified type Takes xanax PRN  Vitamin D deficiency -     VITAMIN D 25 Hydroxy (Vit-D Deficiency, Fractures)  BMI 29.0-29.9,adult - long discussion about weight loss, diet, and exercise -recommended diet heavy in fruits and veggies and low in animal meats, cheeses, and dairy products  Anemia, unspecified type -     Iron,Total/Total Iron Binding Cap -     Vitamin B12   Discussed med's effects and SE's. Screening labs and tests as requested with regular follow-up as recommended. Future Appointments  Date Time Provider [place]  10/26/2017 10:00 AM [facility] LAB 3 [facility] None  10/29/2017 11:15 AM Curt Bears, MD [facility] None  10/07/2018  2:00 PM Vicie Mutters, PA-C [facility] None    HPI  71 y.o. female  presents for a complete physical.  Her blood pressure  has been controlled at home, today their BP is BP: 124/80.  She does not workout. She denies chest pain, shortness of breath, dizziness.  She has history of stage 4 lung cancer, on Glotinef maintenance therapy and following with Dr. Julien Nordmann. Has chemotherapy induced dermatitis on scalp but has resolved. Last CT chest 05/2017- going in Jan again for repeat.  She is on cholesterol medication and denies myalgias. Her cholesterol is at goal. The cholesterol last visit was:  Lab Results  Component Value Date   CHOL 166 06/14/2017   HDL 50 (L) 06/14/2017   LDLCALC 86 03/08/2017   TRIG 105 06/14/2017   CHOLHDL 3.3 06/14/2017  .  Last A1C in the office was:  Lab Results  Component Value Date   HGBA1C 5.3 03/08/2017   Patient is on Vitamin D supplement.   Lab Results  Component Value Date   VD25OH 100 03/08/2017     She is on thyroid medication. Her medication was not changed last visit.   Lab Results  Component Value Date   TSH 2.32 06/14/2017  .   BMI is Body mass index is 29.12 kg/m., she is working on diet and exercise. Wt Readings from Last 3 Encounters:  09/26/17 151 lb 9.6 oz (68.8 kg)  08/27/17 153 lb 9.6 oz (69.7 kg)  07/20/17 146 lb 6.4 oz (66.4 kg)    Current Medications:  Current Outpatient Medications on File Prior to Visit  Medication Sig Dispense Refill  . ALPRAZolam (XANAX) 1 MG tablet  TAKE 1/2 TO 1 TABLET BY MOUTH 2 TO 3 TIMES DAILY ONLY AS NEEDED FOR ANXIETY 90 tablet 1  . aspirin 81 MG tablet Take 81 mg by mouth at bedtime.     . Cholecalciferol (VITAMIN D3) 10000 UNITS TABS Take by mouth.    . Cyanocobalamin (VITAMIN B 12 PO) Take 1 tablet by mouth daily.    Marland Kitchen FLUZONE HIGH-DOSE 0.5 ML injection ADM 0.5ML IM UTD  0  . GILOTRIF 30 MG tablet TAKE 1 TABLET BY MOUTH ONCE DAILY. TAKE ON AN EMPTY STOMACH 1 HOUR BEFORE OR 2 HOURS AFTER MEALS 30 tablet 2  . HYDROcodone-homatropine (HYCODAN) 5-1.5 MG/5ML syrup Take 5 mLs by mouth every 6 (six) hours as needed for cough.  120 mL 0  . ibuprofen (ADVIL,MOTRIN) 200 MG tablet Take 200 mg by mouth every 6 (six) hours as needed.    Marland Kitchen ipratropium (ATROVENT) 0.03 % nasal spray Place 2 sprays into the nose 3 (three) times daily. 60 mL 3  . levothyroxine (SYNTHROID, LEVOTHROID) 50 MCG tablet TAKE 1 TABLET BY MOUTH EVERY DAY 90 tablet 1  . montelukast (SINGULAIR) 10 MG tablet TAKE 1 TABLET BY MOUTH EVERY DAY 90 tablet 1   No current facility-administered medications on file prior to visit.     Health Maintenance:   Immunization History  Administered Date(s) Administered  . Influenza, High Dose Seasonal PF 07/31/2014  . Influenza-Unspecified 09/13/2015, 07/23/2016, 08/06/2017  . Pneumococcal Conjugate-13 12/24/2015  . Pneumococcal Polysaccharide-23 07/22/2012  . Td 12/25/2005  . Zoster 01/09/2007   Tetanus: 2007 DUE declines Pneumovax: 2013 Prevnar: 2017 Flu vaccine:2018 Zostavax:2008  Pap: Hysterectomy MGM:11/20/2016  DEXA:2013 Colonoscopy: 2014  Last Eye Exam: Changing to somebody else, was seeing Dr. Earl Gala, due in January  Patient Care Team: [name], MD as PCP - General (Internal Medicine) Sharol Roussel, [place] as Physician Assistant (Optometry) Jettie Booze, MD as Consulting Physician (Interventional Cardiology) Teena Irani, MD as Consulting Physician (Gastroenterology) Druscilla Brownie, MD as Consulting Physician (Dermatology) Clent Jacks, MD as Consulting Physician (Ophthalmology) Erroll Luna, MD as Consulting Physician (General Surgery)  Medical History:  Past Medical History:  Diagnosis Date  . Adenocarcinoma of left lung, stage 4 ([place]) 04/05/2015   Biopsy confirmed CT SCAN: There are innumerable bilateral pulmonary nodules. These range in size from about 5 mm to 2 cm. They demonstrate irregular indistinct borders, the larger ones demonstrating spiculation. PET SCAN: Diffuse pulmonary metastatic disease with a 4 cm dominant left lower low lung lesion. No enlarged or  hypermetabolic mediastinal or hilar adenopathy.  . Allergy   . Anxiety   . Cancer ([place])   . Change of skin related to chemotherapy 09/25/2017  . Drug-induced skin rash 01/17/2016  . Hyperlipidemia   . Hypertension   . Insomnia   . Prediabetes   . Thyroid disease    Allergies Allergies  Allergen Reactions  . Penicillins Swelling and Rash   SURGICAL HISTORY She  has a past surgical history that includes Tonsillectomy and adenoidectomy and Abdominal hysterectomy (1995). FAMILY HISTORY Her family history includes ALS in her father; Alcohol abuse in her mother; Hypertension in her father; Liver disease in her mother. SOCIAL HISTORY She  reports that she quit smoking about 42 years ago. She has a 5.00 pack-year smoking history. she has never used smokeless tobacco. She reports that she does not drink alcohol or use drugs.  Review of Systems: Review of Systems  Constitutional: Negative for chills, fever and malaise/fatigue.  HENT: Negative for congestion, ear pain  and sore throat.   Eyes: Negative.   Respiratory: Positive for shortness of breath. Negative for cough and wheezing.   Cardiovascular: Negative for chest pain, palpitations and leg swelling.  Gastrointestinal: Negative for abdominal pain, blood in stool, constipation, diarrhea, heartburn and melena.  Genitourinary: Negative.   Skin: Negative for itching and rash.  Neurological: Negative for dizziness, sensory change, loss of consciousness and headaches.  Psychiatric/Behavioral: Negative for depression. The patient is not nervous/anxious and does not have insomnia.     Physical Exam: Estimated body mass index is 29.12 kg/m as calculated from the following:   Height as of this encounter: 5' 0.5" (1.537 m).   Weight as of this encounter: 151 lb 9.6 oz (68.8 kg). BP 124/80   Pulse 94   Temp (!) 97.3 F (36.3 C)   Resp 16   Ht 5' 0.5" (1.537 m)   Wt 151 lb 9.6 oz (68.8 kg)   SpO2 97%   BMI 29.12 kg/m   General  Appearance: Well nourished well developed, in no apparent distress.  Eyes: PERRLA, EOMs, conjunctiva no swelling or erythema ENT/Mouth: Ear canals normal without obstruction, swelling, erythema, or discharge.  TMs normal bilaterally with no erythema, bulging, retraction, or loss of landmark.  Oropharynx moist and clear with no exudate, erythema, or swelling.   Neck: Supple, thyroid normal. No bruits.  No cervical adenopathy Respiratory: Respiratory effort normal, Breath sounds clear A&P without wheeze, rhonchi, rales.   Cardio: RRR without murmurs, rubs or gallops. Brisk peripheral pulses without edema.  Chest: symmetric, with normal excursions Breasts: Symmetric, without lumps, nipple discharge, retractions.  Abdomen: Soft, nontender, no guarding, rebound, hernias, masses, or organomegaly.  Lymphatics: Non tender without lymphadenopathy.  Musculoskeletal: Full ROM all peripheral extremities,5/5 strength, and normal gait.  Skin: Erythematous papular rash to the bilateral forearms and the neck.  Non-tender, warm and dry Neuro: Awake and oriented X 3, Cranial nerves intact, reflexes equal bilaterally. Normal muscle tone, no cerebellar symptoms. Sensation intact.  Psych:  normal affect, Insight and Judgment appropriate.   EKG: WNL no changes.  Over 40 minutes of exam, counseling, chart review and critical decision making was performed  Vicie Mutters 2:03 PM [facility]

## 2017-09-26 ENCOUNTER — Encounter: Payer: Self-pay | Admitting: Physician Assistant

## 2017-09-26 ENCOUNTER — Ambulatory Visit (INDEPENDENT_AMBULATORY_CARE_PROVIDER_SITE_OTHER): Payer: Medicare Other | Admitting: Physician Assistant

## 2017-09-26 ENCOUNTER — Encounter: Payer: Self-pay | Admitting: Internal Medicine

## 2017-09-26 VITALS — BP 124/80 | HR 94 | Temp 97.3°F | Resp 16 | Ht 60.5 in | Wt 151.6 lb

## 2017-09-26 DIAGNOSIS — E782 Mixed hyperlipidemia: Secondary | ICD-10-CM

## 2017-09-26 DIAGNOSIS — Z6829 Body mass index (BMI) 29.0-29.9, adult: Secondary | ICD-10-CM

## 2017-09-26 DIAGNOSIS — D649 Anemia, unspecified: Secondary | ICD-10-CM

## 2017-09-26 DIAGNOSIS — I10 Essential (primary) hypertension: Secondary | ICD-10-CM

## 2017-09-26 DIAGNOSIS — C3492 Malignant neoplasm of unspecified part of left bronchus or lung: Secondary | ICD-10-CM

## 2017-09-26 DIAGNOSIS — Z136 Encounter for screening for cardiovascular disorders: Secondary | ICD-10-CM | POA: Diagnosis not present

## 2017-09-26 DIAGNOSIS — Z5111 Encounter for antineoplastic chemotherapy: Secondary | ICD-10-CM

## 2017-09-26 DIAGNOSIS — Z0000 Encounter for general adult medical examination without abnormal findings: Secondary | ICD-10-CM | POA: Diagnosis not present

## 2017-09-26 DIAGNOSIS — E039 Hypothyroidism, unspecified: Secondary | ICD-10-CM

## 2017-09-26 DIAGNOSIS — Z79899 Other long term (current) drug therapy: Secondary | ICD-10-CM

## 2017-09-26 DIAGNOSIS — E559 Vitamin D deficiency, unspecified: Secondary | ICD-10-CM

## 2017-09-26 DIAGNOSIS — F419 Anxiety disorder, unspecified: Secondary | ICD-10-CM

## 2017-09-26 DIAGNOSIS — I700 Atherosclerosis of aorta: Secondary | ICD-10-CM

## 2017-09-26 DIAGNOSIS — G4700 Insomnia, unspecified: Secondary | ICD-10-CM

## 2017-09-26 MED ORDER — ALPRAZOLAM 1 MG PO TABS: ORAL_TABLET | ORAL | 1 refills | Status: DC

## 2017-09-26 NOTE — Patient Instructions (Signed)

## 2017-09-27 LAB — LIPID PANEL
CHOL/HDL RATIO: 3.6 (calc) (ref <5.0)
Cholesterol: 185 mg/dL (ref <200)
HDL: 51 mg/dL (ref >50)
LDL Cholesterol (Calc): 111 mg/dL (calc) — ABNORMAL HIGH
NON-HDL CHOLESTEROL (CALC): 134 mg/dL — AB (ref <130)
TRIGLYCERIDES: 124 mg/dL (ref <150)

## 2017-09-27 LAB — URINALYSIS, ROUTINE W REFLEX MICROSCOPIC
BILIRUBIN URINE: NEGATIVE
Glucose, UA: NEGATIVE
HGB URINE DIPSTICK: NEGATIVE
Ketones, ur: NEGATIVE
Leukocytes, UA: NEGATIVE
Nitrite: NEGATIVE
PH: 5.5 (ref 5.0–8.0)
Protein, ur: NEGATIVE
SPECIFIC GRAVITY, URINE: 1.011 (ref 1.001–1.03)

## 2017-09-27 LAB — CBC WITH DIFFERENTIAL/PLATELET
BASOS ABS: 59 {cells}/uL (ref 0–200)
BASOS PCT: 0.9 %
EOS ABS: 150 {cells}/uL (ref 15–500)
Eosinophils Relative: 2.3 %
HCT: 38.5 % (ref 35.0–45.0)
HEMOGLOBIN: 13 g/dL (ref 11.7–15.5)
Lymphs Abs: 1313 cells/uL (ref 850–3900)
MCH: 28.5 pg (ref 27.0–33.0)
MCHC: 33.8 g/dL (ref 32.0–36.0)
MCV: 84.4 fL (ref 80.0–100.0)
MPV: 12.7 fL — AB (ref 7.5–12.5)
Monocytes Relative: 8.7 %
Neutro Abs: 4414 cells/uL (ref 1500–7800)
Neutrophils Relative %: 67.9 %
Platelets: 197 10*3/uL (ref 140–400)
RBC: 4.56 10*6/uL (ref 3.80–5.10)
RDW: 12.6 % (ref 11.0–15.0)
TOTAL LYMPHOCYTE: 20.2 %
WBC: 6.5 10*3/uL (ref 3.8–10.8)
WBCMIX: 566 {cells}/uL (ref 200–950)

## 2017-09-27 LAB — BASIC METABOLIC PANEL WITH GFR
BUN/Creatinine Ratio: 19 (calc) (ref 6–22)
BUN: 21 mg/dL (ref 7–25)
CALCIUM: 9.1 mg/dL (ref 8.6–10.4)
CO2: 28 mmol/L (ref 20–32)
CREATININE: 1.13 mg/dL — AB (ref 0.60–0.93)
Chloride: 104 mmol/L (ref 98–110)
GFR, EST NON AFRICAN AMERICAN: 49 mL/min/{1.73_m2} — AB (ref >=60)
GFR, Est African American: 57 mL/min/{1.73_m2} — ABNORMAL LOW (ref >=60)
Glucose, Bld: 84 mg/dL (ref 65–99)
POTASSIUM: 4 mmol/L (ref 3.5–5.3)
Sodium: 140 mmol/L (ref 135–146)

## 2017-09-27 LAB — HEPATIC FUNCTION PANEL
AG RATIO: 1.5 (calc) (ref 1.0–2.5)
ALBUMIN MSPROF: 3.9 g/dL (ref 3.6–5.1)
ALKALINE PHOSPHATASE (APISO): 70 U/L (ref 33–130)
ALT: 20 U/L (ref 6–29)
AST: 21 U/L (ref 10–35)
BILIRUBIN DIRECT: 0.2 mg/dL (ref 0.0–0.2)
BILIRUBIN TOTAL: 0.8 mg/dL (ref 0.2–1.2)
Globulin: 2.6 g/dL (calc) (ref 1.9–3.7)
Indirect Bilirubin: 0.6 mg/dL (calc) (ref 0.2–1.2)
Total Protein: 6.5 g/dL (ref 6.1–8.1)

## 2017-09-27 LAB — VITAMIN D 25 HYDROXY (VIT D DEFICIENCY, FRACTURES): Vit D, 25-Hydroxy: 105 ng/mL — ABNORMAL HIGH (ref 30–100)

## 2017-09-27 LAB — MAGNESIUM: MAGNESIUM: 1.8 mg/dL (ref 1.5–2.5)

## 2017-09-27 LAB — IRON, TOTAL/TOTAL IRON BINDING CAP
%SAT: 19 % (ref 11–50)
Iron: 55 ug/dL (ref 45–160)
TIBC: 296 ug/dL (ref 250–450)

## 2017-09-27 LAB — VITAMIN B12: VITAMIN B 12: 427 pg/mL (ref 200–1100)

## 2017-09-27 LAB — MICROALBUMIN / CREATININE URINE RATIO
Creatinine, Urine: 59 mg/dL (ref 20–275)
Microalb, Ur: <0.2 mg/dL

## 2017-09-27 LAB — TSH: TSH: 2.77 mIU/L (ref 0.40–4.50)

## 2017-10-10 MED FILL — GILOTRIF 30 MG TABLET: 30 | 30 days supply | Qty: 30 | Fill #2

## 2017-10-26 ENCOUNTER — Other Ambulatory Visit: Payer: Medicare Other

## 2017-10-26 ENCOUNTER — Other Ambulatory Visit (HOSPITAL_BASED_OUTPATIENT_CLINIC_OR_DEPARTMENT_OTHER): Payer: Medicare Other

## 2017-10-26 ENCOUNTER — Ambulatory Visit (HOSPITAL_COMMUNITY)
Admission: RE | Admit: 2017-10-26 | Discharge: 2017-10-26 | Disposition: A | Discharge status: Home / Self Care | Payer: Medicare Other | Source: Ambulatory Visit | Attending: Internal Medicine | Admitting: Internal Medicine

## 2017-10-26 DIAGNOSIS — R918 Other nonspecific abnormal finding of lung field: Secondary | ICD-10-CM | POA: Insufficient documentation

## 2017-10-26 DIAGNOSIS — C3490 Malignant neoplasm of unspecified part of unspecified bronchus or lung: Secondary | ICD-10-CM

## 2017-10-26 LAB — COMPREHENSIVE METABOLIC PANEL
ALT: 22 U/L (ref 0–55)
ANION GAP: 6 meq/L (ref 3–11)
AST: 19 U/L (ref 5–34)
Albumin: 3.4 g/dL — ABNORMAL LOW (ref 3.5–5.0)
Alkaline Phosphatase: 70 U/L (ref 40–150)
BUN: 15.7 mg/dL (ref 7.0–26.0)
CO2: 28 meq/L (ref 22–29)
CREATININE: 1 mg/dL (ref 0.6–1.1)
Calcium: 8.8 mg/dL (ref 8.4–10.4)
Chloride: 105 mEq/L (ref 98–109)
EGFR: 57 mL/min/{1.73_m2} — ABNORMAL LOW (ref >60)
Glucose: 97 mg/dl (ref 70–140)
Potassium: 4.1 mEq/L (ref 3.5–5.1)
SODIUM: 139 meq/L (ref 136–145)
Total Bilirubin: 0.9 mg/dL (ref 0.20–1.20)
Total Protein: 6.5 g/dL (ref 6.4–8.3)

## 2017-10-26 LAB — CBC WITH DIFFERENTIAL/PLATELET
BASO%: 0.3 % (ref 0.0–2.0)
BASOS ABS: 0 10*3/uL (ref 0.0–0.1)
EOS%: 2.3 % (ref 0.0–7.0)
Eosinophils Absolute: 0.1 10*3/uL (ref 0.0–0.5)
HEMATOCRIT: 37.2 % (ref 34.8–46.6)
HEMOGLOBIN: 12.2 g/dL (ref 11.6–15.9)
LYMPH%: 21.4 % (ref 14.0–49.7)
MCH: 28.2 pg (ref 25.1–34.0)
MCHC: 32.8 g/dL (ref 31.5–36.0)
MCV: 85.9 fL (ref 79.5–101.0)
MONO#: 0.4 10*3/uL (ref 0.1–0.9)
MONO%: 7.1 % (ref 0.0–14.0)
NEUT#: 4 10*3/uL (ref 1.5–6.5)
NEUT%: 68.9 % (ref 38.4–76.8)
Platelets: 173 10*3/uL (ref 145–400)
RBC: 4.33 10*6/uL (ref 3.70–5.45)
RDW: 13 % (ref 11.2–14.5)
WBC: 5.8 10*3/uL (ref 3.9–10.3)
lymph#: 1.2 10*3/uL (ref 0.9–3.3)

## 2017-10-26 MED ORDER — IOPAMIDOL (ISOVUE-300) INJECTION 61%
75.0000 mL | Freq: Once | INTRAVENOUS | Status: AC | PRN
  Administered 2017-10-26: 75 mL via INTRAVENOUS

## 2017-10-29 ENCOUNTER — Telehealth: Payer: Self-pay | Admitting: Internal Medicine

## 2017-10-29 ENCOUNTER — Encounter: Payer: Self-pay | Admitting: Internal Medicine

## 2017-10-29 ENCOUNTER — Inpatient Hospital Stay: Payer: Medicare Other | Attending: Internal Medicine | Admitting: Internal Medicine

## 2017-10-29 ENCOUNTER — Encounter: Payer: Self-pay | Admitting: *Deleted

## 2017-10-29 VITALS — BP 143/73 | HR 93 | Temp 97.5°F | Resp 18 | Ht 60.5 in | Wt 154.4 lb

## 2017-10-29 DIAGNOSIS — G4700 Insomnia, unspecified: Secondary | ICD-10-CM | POA: Diagnosis not present

## 2017-10-29 DIAGNOSIS — E079 Disorder of thyroid, unspecified: Secondary | ICD-10-CM

## 2017-10-29 DIAGNOSIS — Z7982 Long term (current) use of aspirin: Secondary | ICD-10-CM

## 2017-10-29 DIAGNOSIS — R7303 Prediabetes: Secondary | ICD-10-CM | POA: Diagnosis not present

## 2017-10-29 DIAGNOSIS — E785 Hyperlipidemia, unspecified: Secondary | ICD-10-CM | POA: Diagnosis not present

## 2017-10-29 DIAGNOSIS — Z5111 Encounter for antineoplastic chemotherapy: Secondary | ICD-10-CM

## 2017-10-29 DIAGNOSIS — L270 Generalized skin eruption due to drugs and medicaments taken internally: Secondary | ICD-10-CM | POA: Insufficient documentation

## 2017-10-29 DIAGNOSIS — I10 Essential (primary) hypertension: Secondary | ICD-10-CM

## 2017-10-29 DIAGNOSIS — F419 Anxiety disorder, unspecified: Secondary | ICD-10-CM | POA: Diagnosis not present

## 2017-10-29 DIAGNOSIS — C3432 Malignant neoplasm of lower lobe, left bronchus or lung: Secondary | ICD-10-CM | POA: Diagnosis not present

## 2017-10-29 DIAGNOSIS — C3492 Malignant neoplasm of unspecified part of left bronchus or lung: Secondary | ICD-10-CM

## 2017-10-29 DIAGNOSIS — Z79899 Other long term (current) drug therapy: Secondary | ICD-10-CM

## 2017-10-29 NOTE — Telephone Encounter (Signed)
Scheduled appt per 1/7 los - Gave patient AVS and calender per los.

## 2017-10-29 NOTE — Progress Notes (Signed)
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  CURRENT THERAPY: Gilotrif 30 mg by mouth daily started 02/21/2016 status post 20 months of treatment.  INTERVAL HISTORY: Elizabeth [name] 72 y.o. female returns to the clinic today for follow-up visit accompanied by her husband.  The patient is feeling very well today with no specific complaints.  She denied having any significant skin rash or diarrhea.  She has no chest pain, shortness breath, cough or hemoptysis.  She has no fever or chills.  She continues to tolerate her treatment with GILOTRIF fairly well.  She had a repeat CT scan of the chest performed recently and she is here for evaluation and discussion of her scan results.  MEDICAL HISTORY: Past Medical History:  Diagnosis Date  . Adenocarcinoma of left lung, stage 4 ([place]) 04/05/2015   Biopsy confirmed CT SCAN: There are innumerable bilateral pulmonary nodules. These range in size from about 5 mm to 2 cm. They demonstrate irregular indistinct borders, the larger ones demonstrating spiculation. PET SCAN: Diffuse pulmonary metastatic disease with a 4 cm dominant left lower low lung lesion. No enlarged or hypermetabolic mediastinal or hilar adenopathy.  . Allergy   . Anxiety   . Cancer ([place])   . Change of skin related to chemotherapy 09/25/2017  . Drug-induced skin rash 01/17/2016  . Hyperlipidemia   . Hypertension   . Insomnia   . Prediabetes   .  Thyroid disease     ALLERGIES:  is allergic to penicillins.  MEDICATIONS:  Current Outpatient Medications  Medication Sig Dispense Refill  . ALPRAZolam (XANAX) 1 MG tablet TAKE 1/2 TO 1 TABLET BY MOUTH 2 TO 3 TIMES DAILY ONLY AS NEEDED FOR ANXIETY 90 tablet 1  . aspirin 81 MG tablet Take 81 mg by mouth at bedtime.     . Cholecalciferol (VITAMIN D3) 10000 UNITS TABS Take by mouth.    . Cyanocobalamin (VITAMIN B 12 PO) Take 1 tablet by mouth daily.    Marland Kitchen FLUZONE HIGH-DOSE 0.5 ML injection ADM 0.5ML IM UTD  0  . GILOTRIF 30 MG tablet TAKE 1 TABLET BY MOUTH ONCE DAILY. TAKE ON AN EMPTY STOMACH 1 HOUR BEFORE OR 2 HOURS AFTER MEALS 30 tablet 2  . HYDROcodone-homatropine (HYCODAN) 5-1.5 MG/5ML syrup Take 5 mLs by mouth every 6 (six) hours as needed for cough. 120 mL 0  . ibuprofen (ADVIL,MOTRIN) 200 MG tablet Take 200 mg by mouth every 6 (six) hours as needed.    Marland Kitchen ipratropium (ATROVENT) 0.03 % nasal spray Place 2 sprays into the nose 3 (three) times daily. 60 mL 3  . levothyroxine (SYNTHROID, LEVOTHROID) 50 MCG tablet TAKE 1 TABLET BY MOUTH EVERY DAY 90 tablet 1  . montelukast (SINGULAIR) 10 MG tablet TAKE 1 TABLET BY MOUTH EVERY DAY 90 tablet 1   No current facility-administered medications for this visit.     SURGICAL HISTORY:  Past Surgical History:  Procedure Laterality Date  . ABDOMINAL HYSTERECTOMY  1995   w  BSO  . TONSILLECTOMY AND ADENOIDECTOMY      REVIEW OF SYSTEMS:  Constitutional: negative Eyes: negative Ears, nose, mouth, throat, and face: negative Respiratory: negative Cardiovascular: negative Gastrointestinal: negative Genitourinary:negative Integument/breast: negative Hematologic/lymphatic: negative Musculoskeletal:negative Neurological: negative Behavioral/Psych: negative Endocrine: negative Allergic/Immunologic: negative   PHYSICAL EXAMINATION: General appearance: alert, cooperative and no distress Head: Normocephalic, without obvious abnormality,  atraumatic Neck: no adenopathy, no JVD, supple, symmetrical, trachea midline and thyroid not enlarged, symmetric, no tenderness/mass/nodules Lymph nodes: Cervical, supraclavicular, and axillary nodes normal. Resp: clear to auscultation bilaterally Back: symmetric, no curvature. ROM normal. No CVA tenderness. Cardio: regular rate and rhythm, S1, S2 normal, no murmur, click, rub or gallop GI: soft, non-tender; bowel sounds normal; no masses,  no organomegaly Extremities: extremities normal, atraumatic, no cyanosis or edema Neurologic: Alert and oriented X 3, normal strength and tone. Normal symmetric reflexes. Normal coordination and gait  ECOG PERFORMANCE STATUS: 0 - Asymptomatic  Blood pressure (!) 143/73, pulse 93, temperature (!) 97.5 F (36.4 C), temperature source Oral, resp. rate 18, height 5' 0.5" (1.537 m), weight 154 lb 6.4 oz (70 kg), SpO2 100 %.  LABORATORY DATA: Lab Results  Component Value Date   WBC 5.8 10/26/2017   HGB 12.2 10/26/2017   HCT 37.2 10/26/2017   MCV 85.9 10/26/2017   PLT 173 10/26/2017      Chemistry      Component Value Date/Time   NA 139 10/26/2017 1252   K 4.1 10/26/2017 1252   CL 104 09/26/2017 1436   CO2 28 10/26/2017 1252   BUN 15.7 10/26/2017 1252   CREATININE 1.0 10/26/2017 1252      Component Value Date/Time   CALCIUM 8.8 10/26/2017 1252   ALKPHOS 70 10/26/2017 1252   AST 19 10/26/2017 1252   ALT 22 10/26/2017 1252   BILITOT 0.90 10/26/2017 1252       RADIOGRAPHIC STUDIES: Ct Chest W Contrast  Result Date: 10/26/2017 CLINICAL DATA:  Non-small-cell lung cancer. EXAM: CT CHEST WITH CONTRAST TECHNIQUE: Multidetector CT imaging of the chest was performed during intravenous contrast administration. CONTRAST:  47m ISOVUE-300 IOPAMIDOL (ISOVUE-300) INJECTION 61% COMPARISON:  06/01/2017 FINDINGS: Cardiovascular: Heart size upper normal. No evidence for pericardial effusion. No thoracic aortic aneurysm. Mediastinum/Nodes: No mediastinal  lymphadenopathy. There is no hilar lymphadenopathy. The esophagus has normal imaging features. There is no axillary lymphadenopathy. Lungs/Pleura: Areas of architectural distortion and probable scarring in the right upper lung are associated with areas of nodularity, similar to prior. Ill-defined nodular opacities are also seen in the left lung, similar. Similar appearance of left infrahilar atelectasis/scarring with some bronchiectatic change. Irregular 9 mm right lower lobe pulmonary nodule measured on the previous study is stable at 9 mm. Upper Abdomen: Right hepatic hemangioma again noted. Musculoskeletal: Bone windows reveal no worrisome lytic or sclerotic osseous lesions. IMPRESSION: 1. Stable exam. Scattered bilateral irregular pulmonary nodules are similar to prior without definite new or progressive lesion on today's study. 2. Left infrahilar atelectasis/scarring is similar. Electronically Signed   By: EMisty StanleyM.D.   On: 10/26/2017 15:10    ASSESSMENT AND PLAN:  This is a very pleasant 72years old white female with stage IV non-small cell lung cancer, adenocarcinoma with positive EGFR mutation with deletion in next 186and currently undergoing treatment with Gilotrif initially at a dose of 40 mg for 9 months followed by 30 mg by mouth daily status post 20 months.  The patient continues to tolerate this treatment fairly well with no concerning complaints. She had  repeat CT scan of the chest performed recently.  I personally and independently reviewed the scans and discussed the results with the patient and her husband.  Her scan showed no evidence for new or progressive lesions. I recommended for the patient to continue her current treatment with GILOTRIF 30 mg p.o. Daily. I will see her back for follow-up visit in 2 months for evaluation with repeat blood work. For hypertension, the patient will continue on her current blood pressure medication and she was advised to monitor it closely at  home. The patient was advised to call immediately if she has any concerning symptoms in the interval. The patient voices understanding of current disease status and treatment options and is in agreement with the current care plan. All questions were answered. The patient knows to call the clinic with any problems, questions or concerns. We can certainly see the patient much sooner if necessary.  Disclaimer: This note was dictated with voice recognition software. Similar sounding words can inadvertently be transcribed and may not be corrected upon review.

## 2017-10-30 ENCOUNTER — Ambulatory Visit: Payer: Self-pay

## 2017-11-02 ENCOUNTER — Other Ambulatory Visit: Payer: Self-pay | Admitting: Pharmacist

## 2017-11-02 ENCOUNTER — Other Ambulatory Visit: Payer: Self-pay | Admitting: Internal Medicine

## 2017-11-12 MED FILL — GILOTRIF 30 MG TABLET: 30 | 30 days supply | Qty: 30 | Fill #0

## 2017-11-13 ENCOUNTER — Inpatient Hospital Stay: Payer: Medicare Other

## 2017-11-13 DIAGNOSIS — C3492 Malignant neoplasm of unspecified part of left bronchus or lung: Secondary | ICD-10-CM

## 2017-11-13 LAB — RESEARCH LABS

## 2017-12-12 MED FILL — GILOTRIF 30 MG TABLET: 30 | 30 days supply | Qty: 30 | Fill #1

## 2017-12-25 ENCOUNTER — Encounter: Payer: Self-pay | Admitting: Internal Medicine

## 2017-12-25 ENCOUNTER — Telehealth: Payer: Self-pay | Admitting: Internal Medicine

## 2017-12-25 ENCOUNTER — Inpatient Hospital Stay: Payer: Medicare Other

## 2017-12-25 ENCOUNTER — Inpatient Hospital Stay: Payer: Medicare Other | Attending: Internal Medicine | Admitting: Internal Medicine

## 2017-12-25 ENCOUNTER — Other Ambulatory Visit: Payer: Self-pay | Admitting: Physician Assistant

## 2017-12-25 DIAGNOSIS — C7802 Secondary malignant neoplasm of left lung: Secondary | ICD-10-CM | POA: Diagnosis not present

## 2017-12-25 DIAGNOSIS — Z7982 Long term (current) use of aspirin: Secondary | ICD-10-CM | POA: Diagnosis not present

## 2017-12-25 DIAGNOSIS — C7801 Secondary malignant neoplasm of right lung: Secondary | ICD-10-CM | POA: Diagnosis not present

## 2017-12-25 DIAGNOSIS — Z79899 Other long term (current) drug therapy: Secondary | ICD-10-CM

## 2017-12-25 DIAGNOSIS — C3432 Malignant neoplasm of lower lobe, left bronchus or lung: Secondary | ICD-10-CM | POA: Diagnosis present

## 2017-12-25 DIAGNOSIS — C3490 Malignant neoplasm of unspecified part of unspecified bronchus or lung: Secondary | ICD-10-CM

## 2017-12-25 DIAGNOSIS — C3492 Malignant neoplasm of unspecified part of left bronchus or lung: Secondary | ICD-10-CM

## 2017-12-25 LAB — CMP (CANCER CENTER ONLY)
ALT: 21 U/L (ref 0–55)
AST: 25 U/L (ref 5–34)
Albumin: 3.6 g/dL (ref 3.5–5.0)
Alkaline Phosphatase: 78 U/L (ref 40–150)
Anion gap: 9 (ref 3–11)
BUN: 17 mg/dL (ref 7–26)
CO2: 26 mmol/L (ref 22–29)
Calcium: 9.3 mg/dL (ref 8.4–10.4)
Chloride: 107 mmol/L (ref 98–109)
Creatinine: 1.02 mg/dL (ref 0.60–1.10)
GFR, Est AFR Am: >60 mL/min (ref >60)
GFR, Estimated: 54 mL/min — ABNORMAL LOW (ref >60)
Glucose, Bld: 52 mg/dL — ABNORMAL LOW (ref 70–140)
Potassium: 3.9 mmol/L (ref 3.5–5.1)
Sodium: 142 mmol/L (ref 136–145)
Total Bilirubin: 0.9 mg/dL (ref 0.2–1.2)
Total Protein: 7.2 g/dL (ref 6.4–8.3)

## 2017-12-25 LAB — CBC WITH DIFFERENTIAL (CANCER CENTER ONLY)
Basophils Absolute: 0.1 10*3/uL (ref 0.0–0.1)
Basophils Relative: 1 %
EOS PCT: 5 %
Eosinophils Absolute: 0.3 10*3/uL (ref 0.0–0.5)
HEMATOCRIT: 39.4 % (ref 34.8–46.6)
Hemoglobin: 12.8 g/dL (ref 11.6–15.9)
LYMPHS ABS: 1.1 10*3/uL (ref 0.9–3.3)
LYMPHS PCT: 20 %
MCH: 27.4 pg (ref 25.1–34.0)
MCHC: 32.5 g/dL (ref 31.5–36.0)
MCV: 84.3 fL (ref 79.5–101.0)
MONO ABS: 0.6 10*3/uL (ref 0.1–0.9)
MONOS PCT: 10 %
NEUTROS ABS: 3.7 10*3/uL (ref 1.5–6.5)
Neutrophils Relative %: 64 %
PLATELETS: 186 10*3/uL (ref 145–400)
RBC: 4.68 MIL/uL (ref 3.70–5.45)
RDW: 13.7 % (ref 11.2–14.5)
WBC Count: 5.8 10*3/uL (ref 3.9–10.3)

## 2017-12-25 NOTE — Progress Notes (Signed)
[place] Telephone:([phone]   Fax:(336) [phone]  OFFICE PROGRESS NOTE  [name], MD 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  CURRENT THERAPY: Gilotrif 30 mg by mouth daily started 02/21/2016 status post 22 months of treatment.  INTERVAL HISTORY: Elizabeth [name] 72 y.o. female returns to the clinic today for 2 months follow-up visit.  The patient is feeling fine today with no specific complaints.  She denied having any weight loss or night sweats.  She has no nausea, vomiting, diarrhea or constipation.  She denied having any significant skin rash.  She continues to tolerate her treatment with GILOTRIF fairly well.  She has no chest pain, shortness of breath, cough or hemoptysis.  She is here today for evaluation with repeat blood work.  MEDICAL HISTORY: Past Medical History:  Diagnosis Date  . Adenocarcinoma of left lung, stage 4 ([place]) 04/05/2015   Biopsy confirmed CT SCAN: There are innumerable bilateral pulmonary nodules. These range in size from about 5 mm to 2 cm. They demonstrate irregular indistinct borders, the larger ones demonstrating spiculation. PET SCAN: Diffuse pulmonary metastatic disease with a 4 cm dominant left lower low lung lesion. No enlarged or hypermetabolic mediastinal or hilar adenopathy.  . Allergy   . Anxiety   . Cancer ([place])   . Change of skin related to chemotherapy 09/25/2017  . Drug-induced skin rash 01/17/2016  . Hyperlipidemia   . Hypertension   . Insomnia   . Prediabetes   . Thyroid disease      ALLERGIES:  is allergic to penicillins.  MEDICATIONS:  Current Outpatient Medications  Medication Sig Dispense Refill  . ALPRAZolam (XANAX) 1 MG tablet TAKE 1/2 TO 1 TABLET BY MOUTH 2 TO 3 TIMES DAILY ONLY AS NEEDED FOR ANXIETY 90 tablet 1  . aspirin 81 MG tablet Take 81 mg by mouth at bedtime.     . Cholecalciferol (VITAMIN D3) 10000 UNITS TABS Take by mouth.    . Cyanocobalamin (VITAMIN B 12 PO) Take 1 tablet by mouth daily.    Marland Kitchen FLUZONE HIGH-DOSE 0.5 ML injection ADM 0.5ML IM UTD  0  . GILOTRIF 30 MG tablet TAKE 1 TABLET BY MOUTH ONCE DAILY. TAKE ON AN EMPTY STOMACH 1 HOUR BEFORE OR 2 HOURS AFTER MEALS 30 tablet 2  . HYDROcodone-homatropine (HYCODAN) 5-1.5 MG/5ML syrup Take 5 mLs by mouth every 6 (six) hours as needed for cough. 120 mL 0  . ibuprofen (ADVIL,MOTRIN) 200 MG tablet Take 200 mg by mouth every 6 (six) hours as needed.    Marland Kitchen ipratropium (ATROVENT) 0.03 % nasal spray Place 2 sprays into the nose 3 (three) times daily. 60 mL 3  . levothyroxine (SYNTHROID, LEVOTHROID) 50 MCG tablet TAKE 1 TABLET BY MOUTH EVERY DAY 90 tablet 1  . montelukast (SINGULAIR) 10 MG tablet TAKE 1 TABLET BY MOUTH EVERY DAY 90 tablet 1   No current facility-administered medications for this visit.     SURGICAL HISTORY:  Past Surgical History:  Procedure Laterality Date  . ABDOMINAL HYSTERECTOMY  1995   w BSO  . TONSILLECTOMY AND  ADENOIDECTOMY      REVIEW OF SYSTEMS:  A comprehensive review of systems was negative.   PHYSICAL EXAMINATION: General appearance: alert, cooperative and no distress Head: Normocephalic, without obvious abnormality, atraumatic Neck: no adenopathy, no JVD, supple, symmetrical, trachea midline and thyroid not enlarged, symmetric, no tenderness/mass/nodules Lymph nodes: Cervical, supraclavicular, and axillary nodes normal. Resp: clear to auscultation bilaterally Back: symmetric, no curvature. ROM normal. No CVA tenderness. Cardio: regular rate and rhythm, S1, S2 normal,  no murmur, click, rub or gallop GI: soft, non-tender; bowel sounds normal; no masses,  no organomegaly Extremities: extremities normal, atraumatic, no cyanosis or edema  ECOG PERFORMANCE STATUS: 0 - Asymptomatic  Blood pressure 136/74, pulse 81, temperature 97.6 F (36.4 C), temperature source Oral, resp. rate 18, height 5' 0.5" (1.537 m), weight 157 lb 8 oz (71.4 kg), SpO2 99 %.  LABORATORY DATA: Lab Results  Component Value Date   WBC 5.8 12/25/2017   HGB 12.2 10/26/2017   HCT 39.4 12/25/2017   MCV 84.3 12/25/2017   PLT 186 12/25/2017      Chemistry      Component Value Date/Time   NA 139 10/26/2017 1252   K 4.1 10/26/2017 1252   CL 104 09/26/2017 1436   CO2 28 10/26/2017 1252   BUN 15.7 10/26/2017 1252   CREATININE 1.0 10/26/2017 1252      Component Value Date/Time   CALCIUM 8.8 10/26/2017 1252   ALKPHOS 70 10/26/2017 1252   AST 19 10/26/2017 1252   ALT 22 10/26/2017 1252   BILITOT 0.90 10/26/2017 1252       RADIOGRAPHIC STUDIES: No results found.  ASSESSMENT AND PLAN:  This is a very pleasant 72 years old white female with stage IV non-small cell lung cancer, adenocarcinoma with positive EGFR mutation with deletion in next 17 and currently undergoing treatment with Gilotrif initially at a dose of 40 mg for 9 months followed by 30 mg by mouth daily status post 22 months.  She continues to tolerate this treatment fairly well. I recommended for the patient to continue her current treatment with GILOTRIF with the same dose. I will see her back for follow-up visit in 2 months for evaluation after repeating CT scan of the chest for restaging of her disease. She was advised to call immediately if she has any concerning symptoms in the interval. The patient voices understanding of current disease status and treatment options and is in agreement with the current care plan. All questions were answered. The patient knows to call the clinic with any problems, questions or  concerns. We can certainly see the patient much sooner if necessary.  Disclaimer: This note was dictated with voice recognition software. Similar sounding words can inadvertently be transcribed and may not be corrected upon review.

## 2017-12-25 NOTE — Telephone Encounter (Signed)
Scheduled appt per 3/5 los - Gave patient AVS and calender per los. Central radiology to contact patient with ct scan

## 2018-01-06 ENCOUNTER — Other Ambulatory Visit: Payer: Self-pay | Admitting: Internal Medicine

## 2018-01-10 MED FILL — GILOTRIF 30 MG TABLET: 30 | 30 days supply | Qty: 30 | Fill #2

## 2018-01-23 ENCOUNTER — Encounter: Payer: Self-pay | Admitting: Adult Health

## 2018-01-23 ENCOUNTER — Ambulatory Visit (INDEPENDENT_AMBULATORY_CARE_PROVIDER_SITE_OTHER): Payer: Medicare Other | Admitting: Adult Health

## 2018-01-23 VITALS — BP 140/86 | HR 103 | Temp 97.5°F | Ht 60.5 in | Wt 156.0 lb

## 2018-01-23 DIAGNOSIS — J069 Acute upper respiratory infection, unspecified: Secondary | ICD-10-CM | POA: Diagnosis not present

## 2018-01-23 MED ORDER — PREDNISONE 20 MG PO TABS: ORAL_TABLET | ORAL | 0 refills | Status: DC

## 2018-01-23 MED ORDER — AZITHROMYCIN 250 MG PO TABS: ORAL_TABLET | ORAL | 1 refills | Status: AC

## 2018-01-23 MED ORDER — PROMETHAZINE-DM 6.25-15 MG/5ML PO SYRP: 5.0000 mL | ORAL_SOLUTION | Freq: Four times a day (QID) | ORAL | 1 refills | Status: DC | PRN

## 2018-01-23 NOTE — Progress Notes (Signed)
Assessment and Plan:  Elizabeth [name] was seen today for uri.  Diagnoses and all orders for this visit:  Acute URI Will proceed with abx tx due to hx of advanced lung CA with strong ER precautions and to contact oncologist prior to the weekend to notify if symptoms not improving in the next day or two.  Suggested symptomatic OTC remedies. Nasal saline spray and steam baths, mucinex for congestion. Nasal steroids, allergy pill, oral steroids Follow up as needed. -     azithromycin (ZITHROMAX) 250 MG tablet; Take 2 tablets (500 mg) on  Day 1,  followed by 1 tablet (250 mg) once daily on Days 2 through 5. -     predniSONE (DELTASONE) 20 MG tablet; 2 tablets daily for 3 days, 1 tablet daily for 4 days. -     promethazine-dextromethorphan (PROMETHAZINE-DM) 6.25-15 MG/5ML syrup; Take 5 mLs by mouth 4 (four) times daily as needed for cough.  Further disposition pending results of labs. Discussed med's effects and SE's.   Over 15 minutes of exam, counseling, chart review, and critical decision making was performed.   Future Appointments  Date Time Provider [place]  02/22/2018  9:30 AM [facility] 6 [facility] None  02/25/2018 11:00 AM Curt Bears, MD [facility] None  03/27/2018 11:00 AM Liane Comber, NP [facility] None  10/07/2018  2:00 PM Vicie Mutters, PA-C [facility] None    ------------------------------------------------------------------------------------------------------------------   HPI BP 140/86   Pulse (!) 103   Temp (!) 97.5 F (36.4 C)   Ht 5' 0.5" (1.537 m)   Wt 156 lb (70.8 kg)   SpO2 96%   BMI 29.97 kg/m   72 y.o.female presents for nasal congestion, "crackling" in her sinuses, facial pressure with new mildly productive cough x 5 days. She endorses intermittent mild sense of chills and mild headache with cough, has been taking ibuprofen for this which has been helpful. Denies fatigue/malaise, night sweats, weakness, dizziness, palpitations, shortness of  breath. She has taken some leftover hycodan cough syrup last night. She presents in no acute distress, 96% sat on RA, RR 18, speaks in complete sentences.   She does have hx of seasonal allergies and takes singulair daily, uses flonase as needed.   She has stage IV non-small cell lung cancer, per Dr. Worthy Flank note -adenocarcinoma diagnosed in June 2016 - presents as large mass in LLL with multiple bilateral pulmonary nodules with recent CT chest follow up from 10/26/2017. She continues with gilotrif 30 mg daily.   Past Medical History:  Diagnosis Date  . Adenocarcinoma of left lung, stage 4 ([place]) 04/05/2015   Biopsy confirmed CT SCAN: There are innumerable bilateral pulmonary nodules. These range in size from about 5 mm to 2 cm. They demonstrate irregular indistinct borders, the larger ones demonstrating spiculation. PET SCAN: Diffuse pulmonary metastatic disease with a 4 cm dominant left lower low lung lesion. No enlarged or hypermetabolic mediastinal or hilar adenopathy.  . Allergy   . Anxiety   . Cancer ([place])   . Change of skin related to chemotherapy 09/25/2017  . Drug-induced skin rash 01/17/2016  . Hyperlipidemia   . Hypertension   . Insomnia   . Prediabetes   . Thyroid disease      Allergies  Allergen Reactions  . Penicillins Swelling and Rash    Current Outpatient Medications on File Prior to Visit  Medication Sig  . ALPRAZolam (XANAX) 1 MG tablet Take 1/2 to 1 tablet 2 to 3 x / day ONLY if need for Anxiety Attack  and please try to limit to 5 days /week to avoid addiction  . aspirin 81 MG tablet Take 81 mg by mouth at bedtime.   . Cholecalciferol (VITAMIN D3) 10000 UNITS TABS Take by mouth.  Marland Kitchen GILOTRIF 30 MG tablet TAKE 1 TABLET BY MOUTH ONCE DAILY. TAKE ON AN EMPTY STOMACH 1 HOUR BEFORE OR 2 HOURS AFTER MEALS  . HYDROcodone-homatropine (HYCODAN) 5-1.5 MG/5ML syrup Take 5 mLs by mouth every 6 (six) hours as needed for cough.  Marland Kitchen ibuprofen (ADVIL,MOTRIN) 200 MG tablet Take 200 mg  by mouth every 6 (six) hours as needed.  Marland Kitchen ipratropium (ATROVENT) 0.03 % nasal spray Place 2 sprays into the nose 3 (three) times daily.  Marland Kitchen levothyroxine (SYNTHROID, LEVOTHROID) 50 MCG tablet TAKE 1 TABLET BY MOUTH EVERY DAY  . montelukast (SINGULAIR) 10 MG tablet TAKE 1 TABLET BY MOUTH EVERY DAY  . Cyanocobalamin (VITAMIN B 12 PO) Take 1 tablet by mouth daily.  Marland Kitchen FLUZONE HIGH-DOSE 0.5 ML injection ADM 0.5ML IM UTD   No current facility-administered medications on file prior to visit.     ROS: all negative except above.   Physical Exam:  BP 140/86   Pulse (!) 103   Temp (!) 97.5 F (36.4 C)   Ht 5' 0.5" (1.537 m)   Wt 156 lb (70.8 kg)   SpO2 96%   BMI 29.97 kg/m   General Appearance: Well nourished, in no apparent distress. Eyes: PERRLA, EOMs, conjunctiva no swelling or erythema Sinuses: No Frontal/maxillary tenderness ENT/Mouth: Ext aud canals clear, TMs without erythema, bulging. No erythema, swelling, or exudate on post pharynx.  Tonsils not swollen or erythematous. Hearing normal.  Neck: Supple.  Respiratory: Respiratory effort normal, BS present throughout with scattered rales and mild expiratory wheezing to bilateral bases without rhonchi stridor.  Cardio: RRR with no MRGs. Brisk peripheral pulses without edema.  Abdomen: Soft, + BS. Lymphatics: Non tender without lymphadenopathy.  Musculoskeletal: Symmetrical strength, normal gait.  Skin: Warm, dry without rashes, lesions, ecchymosis.  Psych: Awake and oriented X 3, normal affect, Insight and Judgment appropriate.     Izora Ribas, NP 2:36 PM [facility]

## 2018-01-23 NOTE — Patient Instructions (Signed)
  Call oncologist on Friday if not improving, ER precautions for sudden/severe symptoms   HOW TO TREAT VIRAL COUGH AND COLD SYMPTOMS:  -Symptoms usually last at least 1 week with the worst symptoms being around day 4.  - colds usually start with a sore throat and end with a cough, and the cough can take 2 weeks to get better.  -No antibiotics are needed for colds, flu, sore throats, cough, bronchitis UNLESS symptoms are longer than 7 days OR if you are getting better then get drastically worse.  -There are a lot of combination medications (Dayquil, Nyquil, Vicks 44, tyelnol cold and sinus, ETC). Please look at the ingredients on the back so that you are treating the correct symptoms and not doubling up on medications/ingredients.    Medicines you can use  Nasal congestion  Little Remedies saline spray (aerosol/mist)- can try this, it is in the kids section - pseudoephedrine (Sudafed)- behind the counter, do not use if you have high blood pressure, medicine that have -D in them.  - phenylephrine (Sudafed PE) -Dextormethorphan + chlorpheniramine (Coridcidin HBP)- okay if you have high blood pressure -Oxymetazoline (Afrin) nasal spray- LIMIT to 3 days -Saline nasal spray -Neti pot (used distilled or bottled water)  Ear pain/congestion  -pseudoephedrine (sudafed) - Nasonex/flonase nasal spray  Fever  -Acetaminophen (Tyelnol) -Ibuprofen (Advil, motrin, aleve)  Sore Throat  -Acetaminophen (Tyelnol) -Ibuprofen (Advil, motrin, aleve) -Drink a lot of water -Gargle with salt water - Rest your voice (don't talk) -Throat sprays -Cough drops  Body Aches  -Acetaminophen (Tyelnol) -Ibuprofen (Advil, motrin, aleve)  Headache  -Acetaminophen (Tyelnol) -Ibuprofen (Advil, motrin, aleve) - Exedrin, Exedrin Migraine  Allergy symptoms (cough, sneeze, runny nose, itchy eyes) -Claritin or loratadine cheapest but likely the weakest  -Zyrtec or certizine at night because it can make you  sleepy -The strongest is allegra or fexafinadine  Cheapest at [employer], [employer], [employer]  Cough  -Dextromethorphan (Delsym)- medicine that has DM in it -Guafenesin (Mucinex/Robitussin) - cough drops - drink lots of water  Chest Congestion  -Guafenesin (Mucinex/Robitussin)  Red Itchy Eyes  - Naphcon-A  Upset Stomach  - Bland diet (nothing spicy, greasy, fried, and high acid foods like tomatoes, oranges, berries) -OKAY- cereal, bread, soup, crackers, rice -Eat smaller more frequent meals -reduce caffeine, no alcohol -Loperamide (Imodium-AD) if diarrhea -Prevacid for heart burn  General health when sick  -Hydration -wash your hands frequently -keep surfaces clean -change pillow cases and sheets often -Get fresh air but do not exercise strenuously -Vitamin D, double up on it - Vitamin C -Zinc

## 2018-01-24 MED FILL — PROMETHAZINE-DM SYRUP: 6.25-15 | 12 days supply | Qty: 240 | Fill #0

## 2018-01-31 ENCOUNTER — Other Ambulatory Visit: Payer: Self-pay | Admitting: Internal Medicine

## 2018-02-07 ENCOUNTER — Other Ambulatory Visit: Payer: Self-pay | Admitting: Internal Medicine

## 2018-02-07 MED FILL — GILOTRIF 30 MG TABLET: 30 | 30 days supply | Qty: 30 | Fill #0

## 2018-02-15 ENCOUNTER — Other Ambulatory Visit: Payer: Self-pay | Admitting: Internal Medicine

## 2018-02-15 ENCOUNTER — Other Ambulatory Visit: Payer: Self-pay | Admitting: Adult Health

## 2018-02-22 ENCOUNTER — Ambulatory Visit (HOSPITAL_COMMUNITY)
Admission: RE | Admit: 2018-02-22 | Discharge: 2018-02-22 | Disposition: A | Discharge status: Home / Self Care | Payer: Medicare Other | Source: Ambulatory Visit | Attending: Internal Medicine | Admitting: Internal Medicine

## 2018-02-22 ENCOUNTER — Inpatient Hospital Stay: Payer: Medicare Other | Attending: Internal Medicine

## 2018-02-22 DIAGNOSIS — C7801 Secondary malignant neoplasm of right lung: Secondary | ICD-10-CM | POA: Diagnosis not present

## 2018-02-22 DIAGNOSIS — C3490 Malignant neoplasm of unspecified part of unspecified bronchus or lung: Secondary | ICD-10-CM | POA: Diagnosis present

## 2018-02-22 DIAGNOSIS — R21 Rash and other nonspecific skin eruption: Secondary | ICD-10-CM | POA: Insufficient documentation

## 2018-02-22 DIAGNOSIS — J479 Bronchiectasis, uncomplicated: Secondary | ICD-10-CM | POA: Diagnosis not present

## 2018-02-22 DIAGNOSIS — C3432 Malignant neoplasm of lower lobe, left bronchus or lung: Secondary | ICD-10-CM | POA: Diagnosis present

## 2018-02-22 DIAGNOSIS — R918 Other nonspecific abnormal finding of lung field: Secondary | ICD-10-CM | POA: Insufficient documentation

## 2018-02-22 DIAGNOSIS — C7802 Secondary malignant neoplasm of left lung: Secondary | ICD-10-CM | POA: Insufficient documentation

## 2018-02-22 DIAGNOSIS — Z7982 Long term (current) use of aspirin: Secondary | ICD-10-CM | POA: Insufficient documentation

## 2018-02-22 DIAGNOSIS — Z79899 Other long term (current) drug therapy: Secondary | ICD-10-CM | POA: Diagnosis not present

## 2018-02-22 LAB — CBC WITH DIFFERENTIAL (CANCER CENTER ONLY)
Basophils Absolute: 0 10*3/uL (ref 0.0–0.1)
Basophils Relative: 1 %
Eosinophils Absolute: 0.3 10*3/uL (ref 0.0–0.5)
Eosinophils Relative: 6 %
HEMATOCRIT: 39 % (ref 34.8–46.6)
Hemoglobin: 12.6 g/dL (ref 11.6–15.9)
LYMPHS PCT: 26 %
Lymphs Abs: 1.4 10*3/uL (ref 0.9–3.3)
MCH: 27.6 pg (ref 25.1–34.0)
MCHC: 32.3 g/dL (ref 31.5–36.0)
MCV: 85.3 fL (ref 79.5–101.0)
MONO ABS: 0.5 10*3/uL (ref 0.1–0.9)
MONOS PCT: 10 %
NEUTROS ABS: 3.1 10*3/uL (ref 1.5–6.5)
Neutrophils Relative %: 57 %
Platelet Count: 188 10*3/uL (ref 145–400)
RBC: 4.57 MIL/uL (ref 3.70–5.45)
RDW: 13.4 % (ref 11.2–14.5)
WBC Count: 5.3 10*3/uL (ref 3.9–10.3)

## 2018-02-22 LAB — CMP (CANCER CENTER ONLY)
ALBUMIN: 3.6 g/dL (ref 3.5–5.0)
ALT: 19 U/L (ref 0–55)
ANION GAP: 6 (ref 3–11)
AST: 24 U/L (ref 5–34)
Alkaline Phosphatase: 73 U/L (ref 40–150)
BUN: 17 mg/dL (ref 7–26)
CO2: 27 mmol/L (ref 22–29)
Calcium: 9.2 mg/dL (ref 8.4–10.4)
Chloride: 105 mmol/L (ref 98–109)
Creatinine: 1.01 mg/dL (ref 0.60–1.10)
GFR, Est AFR Am: >60 mL/min (ref >60)
GFR, Estimated: 55 mL/min — ABNORMAL LOW (ref >60)
GLUCOSE: 82 mg/dL (ref 70–140)
POTASSIUM: 4 mmol/L (ref 3.5–5.1)
Sodium: 138 mmol/L (ref 136–145)
Total Bilirubin: 0.9 mg/dL (ref 0.2–1.2)
Total Protein: 6.8 g/dL (ref 6.4–8.3)

## 2018-02-22 MED ORDER — IOHEXOL 300 MG/ML  SOLN
75.0000 mL | Freq: Once | INTRAMUSCULAR | Status: AC | PRN
  Administered 2018-02-22: 75 mL via INTRAVENOUS

## 2018-02-25 ENCOUNTER — Inpatient Hospital Stay (HOSPITAL_BASED_OUTPATIENT_CLINIC_OR_DEPARTMENT_OTHER): Payer: Medicare Other | Admitting: Internal Medicine

## 2018-02-25 ENCOUNTER — Telehealth: Payer: Self-pay | Admitting: Internal Medicine

## 2018-02-25 ENCOUNTER — Encounter: Payer: Self-pay | Admitting: Internal Medicine

## 2018-02-25 VITALS — BP 137/83 | HR 83 | Temp 97.7°F | Resp 18 | Ht 60.5 in | Wt 155.6 lb

## 2018-02-25 DIAGNOSIS — C7802 Secondary malignant neoplasm of left lung: Secondary | ICD-10-CM

## 2018-02-25 DIAGNOSIS — C3432 Malignant neoplasm of lower lobe, left bronchus or lung: Secondary | ICD-10-CM

## 2018-02-25 DIAGNOSIS — R21 Rash and other nonspecific skin eruption: Secondary | ICD-10-CM

## 2018-02-25 DIAGNOSIS — Z7982 Long term (current) use of aspirin: Secondary | ICD-10-CM

## 2018-02-25 DIAGNOSIS — Z5111 Encounter for antineoplastic chemotherapy: Secondary | ICD-10-CM

## 2018-02-25 DIAGNOSIS — C7801 Secondary malignant neoplasm of right lung: Secondary | ICD-10-CM

## 2018-02-25 DIAGNOSIS — Z79899 Other long term (current) drug therapy: Secondary | ICD-10-CM

## 2018-02-25 DIAGNOSIS — C3492 Malignant neoplasm of unspecified part of left bronchus or lung: Secondary | ICD-10-CM

## 2018-02-25 NOTE — Progress Notes (Signed)
[facility] Telephone:([phone]) [phone]   Fax:([phone]  OFFICE PROGRESS NOTE  [name], MD 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  CURRENT THERAPY: Gilotrif 30 mg by mouth daily started 02/21/2016 status post 24 months of treatment.  INTERVAL HISTORY: Elizabeth [name] 72 y.o. female returns to the clinic today for follow-up visit accompanied by her husband.  The patient is feeling fine today with no concerning complaints.  She has mild skin rash but no significant diarrhea.  She denied having any chest pain, shortness of breath, cough or hemoptysis.  She denied having any fever or chills.  She has no nausea, vomiting, diarrhea or constipation.  She has no significant weight loss.  She had a repeat CT scan of the chest performed recently and she is here for evaluation and discussion of her discuss results.  MEDICAL HISTORY: Past Medical History:  Diagnosis Date  . Adenocarcinoma of left lung, stage 4 ([place]) 04/05/2015   Biopsy confirmed CT SCAN: There are innumerable bilateral pulmonary nodules. These range in size from about 5 mm to 2 cm. They demonstrate irregular indistinct borders, the larger ones demonstrating spiculation. PET SCAN: Diffuse pulmonary metastatic disease with a 4 cm dominant left lower low lung lesion. No enlarged or hypermetabolic mediastinal or hilar adenopathy.  . Allergy   . Anxiety   . Cancer ([place])   . Change of skin related to chemotherapy 09/25/2017  . Drug-induced skin rash 01/17/2016  . Hyperlipidemia   .  Hypertension   . Insomnia   . Prediabetes   . Thyroid disease     ALLERGIES:  is allergic to penicillins.  MEDICATIONS:  Current Outpatient Medications  Medication Sig Dispense Refill  . ALPRAZolam (XANAX) 1 MG tablet TAKE 1/2 TO 1 TABLET  2-3 TIMES DAILY ONLY IF NEEDED FOR ANXIETY ATTACK. LIMIT TO 5 DAYS PER WEEK TO AVOID ADDICTION 90 tablet 0  . aspirin 81 MG tablet Take 81 mg by mouth at bedtime.     . Cholecalciferol (VITAMIN D3) 10000 UNITS TABS Take by mouth.    . Cyanocobalamin (VITAMIN B 12 PO) Take 1 tablet by mouth daily.    Marland Kitchen FLUZONE HIGH-DOSE 0.5 ML injection ADM 0.5ML IM UTD  0  . GILOTRIF 30 MG tablet TAKE 1 TABLET BY MOUTH ONCE DAILY. TAKE ON AN EMPTY STOMACH 1 HOUR BEFORE OR 2 HOURS AFTER A MEAL 30 tablet 2  . HYDROcodone-homatropine (HYCODAN) 5-1.5 MG/5ML syrup Take 5 mLs by mouth every 6 (six) hours as needed for cough. 120 mL 0  . ibuprofen (ADVIL,MOTRIN) 200 MG tablet Take 200 mg by mouth every 6 (six) hours as needed.    Marland Kitchen ipratropium (ATROVENT) 0.03 % nasal spray Place 2 sprays into the nose 3 (three) times daily. 60 mL 3  . levothyroxine (SYNTHROID, LEVOTHROID) 50 MCG tablet TAKE 1 TABLET BY MOUTH EVERY DAY 90 tablet 0  . montelukast (SINGULAIR) 10 MG tablet TAKE 1 TABLET BY MOUTH EVERY DAY 90 tablet 0  . predniSONE (DELTASONE) 20 MG tablet 2 tablets daily for 3 days, 1 tablet daily for  4 days. 10 tablet 0  . promethazine-dextromethorphan (PROMETHAZINE-DM) 6.25-15 MG/5ML syrup Take 5 mLs by mouth 4 (four) times daily as needed for cough. 240 mL 1   No current facility-administered medications for this visit.     SURGICAL HISTORY:  Past Surgical History:  Procedure Laterality Date  . ABDOMINAL HYSTERECTOMY  1995   w BSO  . TONSILLECTOMY AND ADENOIDECTOMY      REVIEW OF SYSTEMS:  Constitutional: negative Eyes: negative Ears, nose, mouth, throat, and face: negative Respiratory: negative Cardiovascular: negative Gastrointestinal:  negative Genitourinary:negative Integument/breast: negative Hematologic/lymphatic: negative Musculoskeletal:negative Neurological: negative Behavioral/Psych: negative Endocrine: negative Allergic/Immunologic: negative   PHYSICAL EXAMINATION: General appearance: alert, cooperative and no distress Head: Normocephalic, without obvious abnormality, atraumatic Neck: no adenopathy, no JVD, supple, symmetrical, trachea midline and thyroid not enlarged, symmetric, no tenderness/mass/nodules Lymph nodes: Cervical, supraclavicular, and axillary nodes normal. Resp: clear to auscultation bilaterally Back: symmetric, no curvature. ROM normal. No CVA tenderness. Cardio: regular rate and rhythm, S1, S2 normal, no murmur, click, rub or gallop GI: soft, non-tender; bowel sounds normal; no masses,  no organomegaly Extremities: extremities normal, atraumatic, no cyanosis or edema Neurologic: Alert and oriented X 3, normal strength and tone. Normal symmetric reflexes. Normal coordination and gait  ECOG PERFORMANCE STATUS: 0 - Asymptomatic  Blood pressure 137/83, pulse 83, temperature 97.7 F (36.5 C), temperature source Oral, resp. rate 18, height 5' 0.5" (1.537 m), weight 155 lb 9.6 oz (70.6 kg), SpO2 100 %.  LABORATORY DATA: Lab Results  Component Value Date   WBC 5.3 02/22/2018   HGB 12.6 02/22/2018   HCT 39.0 02/22/2018   MCV 85.3 02/22/2018   PLT 188 02/22/2018      Chemistry      Component Value Date/Time   NA 138 02/22/2018 0905   NA 139 10/26/2017 1252   K 4.0 02/22/2018 0905   K 4.1 10/26/2017 1252   CL 105 02/22/2018 0905   CO2 27 02/22/2018 0905   CO2 28 10/26/2017 1252   BUN 17 02/22/2018 0905   BUN 15.7 10/26/2017 1252   CREATININE 1.01 02/22/2018 0905   CREATININE 1.0 10/26/2017 1252      Component Value Date/Time   CALCIUM 9.2 02/22/2018 0905   CALCIUM 8.8 10/26/2017 1252   ALKPHOS 73 02/22/2018 0905   ALKPHOS 70 10/26/2017 1252   AST 24 02/22/2018 0905   AST 19  10/26/2017 1252   ALT 19 02/22/2018 0905   ALT 22 10/26/2017 1252   BILITOT 0.9 02/22/2018 0905   BILITOT 0.90 10/26/2017 1252       RADIOGRAPHIC STUDIES: Ct Chest W Contrast  Result Date: 02/22/2018 CLINICAL DATA:  Lung cancer diagnosed in 2016. Chemotherapy in progress. Asymptomatic. Stage IV adenocarcinoma of the lung. EXAM: CT CHEST WITH CONTRAST TECHNIQUE: Multidetector CT imaging of the chest was performed during intravenous contrast administration. CONTRAST:  18m OMNIPAQUE IOHEXOL 300 MG/ML  SOLN COMPARISON:  10/26/2017 FINDINGS: Cardiovascular: Aortic atherosclerosis. Heart size accentuated by a pectus excavatum deformity. No pericardial effusion. No central pulmonary embolism, on this non-dedicated study. Mediastinum/Nodes: No supraclavicular adenopathy. Subcentimeter left thyroid nodule is grossly similar and nonspecific. No mediastinal or hilar adenopathy. Tiny hiatal hernia. Lungs/Pleura: No pleural fluid.  Mild centrilobular emphysema. Similar appearance of relatively diffuse areas of ground-glass and interstitial opacity with mild architectural distortion. Example more solid component in the right upper lobe at 9 mm on image 68/4, similar. Area of consolidation and bronchiectasis within the central left lower lobe is unchanged, including on image 77/7. Upper Abdomen:  Cavernous hemangioma within the posterior segment right liver lobe 4.1 cm. Normal imaged portions of the spleen, stomach, gallbladder, pancreas, adrenal glands, kidneys. Musculoskeletal: No acute osseous abnormality. Upper thoracic and cervicothoracic junction spondylosis. IMPRESSION: 1. Similar appearance of the lungs. Innumerable areas of ground-glass/interstitial opacity and architectural distortion. Similar central left lower lobe consolidation with traction bronchiectasis. 2. No thoracic adenopathy. Electronically Signed   By: Abigail Miyamoto M.D.   On: 02/22/2018 14:20    ASSESSMENT AND PLAN:  This is a very pleasant 72  years old white female with stage IV non-small cell lung cancer, adenocarcinoma with positive EGFR mutation with deletion in next 24 and currently undergoing treatment with Gilotrif initially at a dose of 40 mg for 9 months followed by 30 mg by mouth daily status post 24 months.  The patient continues to tolerate her treatment with GILOTRIF fairly well.  She had repeat CT scan of the chest performed recently.  I personally and independently reviewed the scan results and discuss it with the patient and her husband today.  Had a scan showed no concerning findings for disease progression. I recommended for her to continue her current treatment with GILOTRIF and she will come back for follow-up visit in 2 months for evaluation with repeat blood work. She was advised to call immediately if she has any concerning symptoms in the interval. The patient voices understanding of current disease status and treatment options and is in agreement with the current care plan. All questions were answered. The patient knows to call the clinic with any problems, questions or concerns. We can certainly see the patient much sooner if necessary.  Disclaimer: This note was dictated with voice recognition software. Similar sounding words can inadvertently be transcribed and may not be corrected upon review.

## 2018-02-25 NOTE — Telephone Encounter (Signed)
Scheduled appt per 5/6 los - Gave patient AVS and calender per los.

## 2018-03-11 MED FILL — GILOTRIF 30 MG TABLET: 30 | 30 days supply | Qty: 30 | Fill #1

## 2018-03-25 NOTE — Progress Notes (Signed)
MEDICARE ANNUAL WELLNESS VISIT AND FOLLOW UP  Assessment:   Annual medicare wellness visit  Essential hypertension - continue medications, DASH diet, exercise and monitor at home. Call if greater than 130/80.  -     CBC with Differential/Platelet -     BASIC METABOLIC PANEL WITH GFR -     Hepatic function panel  Aortic atherosclerosis (HCC) Control blood pressure, cholesterol, glucose, increase exercise.   Adenocarcinoma of left lung, stage 4 (HCC) Cont follow up, q3 month CTs, gilotrif   Acquired hypothyroidism Hypothyroidism-check TSH level, continue medications the same, reminded to take on an empty stomach 30-46mins before food.  -     TSH  Hyperlipidemia -continue medications, check lipids, decrease fatty foods, increase activity.  -     Lipid panel  Other abnormal glucose Discussed general issues about diabetes pathophysiology and management., Educational material distributed., Suggested low cholesterol diet., Encouraged aerobic exercise., Discussed foot care., Reminded to get yearly retinal exam.  Anxiety Well managed by current regimen; continue medications Stress management techniques discussed, increase water, good sleep hygiene discussed, increase exercise, and increase veggies.   Vitamin D deficiency Continue supplement  Over 40 minutes of exam, counseling, chart review and critical decision making was performed Future Appointments  Date Time Provider [place]  04/29/2018  9:45 AM [facility] 4 [facility] None  04/29/2018 10:15 AM Curt Bears, MD [facility] None  10/07/2018  2:00 PM Vicie Mutters, PA-C [facility] None     Plan:   During the course of the visit the patient was educated and counseled about appropriate screening and preventive services including:    Pneumococcal vaccine   Prevnar 13  Influenza vaccine  Td vaccine  Screening electrocardiogram  Bone densitometry screening  Colorectal cancer screening  Diabetes  screening  Glaucoma screening  Nutrition counseling   Advanced directives: requested   Subjective:  Elizabeth [name] is a 72 y.o. female who presents for Medicare Annual Wellness Visit and 3 month follow up. She was diagnosed with Stage 4 LLL lung cancer (June 2016) and is followed on Gilotrif by Dr Julien Nordmann. She is discussing left knee TKA with Dr. 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   she has a diagnosis of anxiety/insomnia and is currently on xanax 0.5-1 mg TID PRN, reports symptoms are well controlled on current regimen. she currently takes 1 mg at night. Doing well with anxiety and hasn't needed during the day.   BMI is Body mass index is 29.77 kg/m., she has been working on diet and exercise. Wt Readings from Last 3 Encounters:  03/27/18 155 lb (70.3 kg)  02/25/18 155 lb 9.6 oz (70.6 kg)  01/23/18 156 lb (70.8 kg)    Her blood pressure has been controlled at home, today their BP is BP: 112/82 She does workout. She denies chest pain, shortness of breath, dizziness.   She is not on cholesterol medication and denies myalgias. Her cholesterol is not at goal. The cholesterol last visit was:   Lab Results  Component Value Date   CHOL 185 09/26/2017   HDL 51 09/26/2017   LDLCALC 111 (H) 09/26/2017   TRIG 124 09/26/2017   CHOLHDL 3.6 09/26/2017    Last A1C in the office was:  Lab Results  Component Value Date   HGBA1C 5.3 03/08/2017   Last GFR: Lab Results  Component Value Date   GFRNONAA 55 (L) 02/22/2018   Patient is on Vitamin D supplement.   Lab Results  Component Value Date   VD25OH 105 (H)  09/26/2017      Medication Review: Current Outpatient Medications on File Prior to Visit  Medication Sig Dispense Refill  . ALPRAZolam (XANAX) 1 MG tablet TAKE 1/2 TO 1 TABLET  2-3 TIMES DAILY ONLY IF NEEDED FOR ANXIETY ATTACK. LIMIT TO 5 DAYS PER WEEK TO AVOID ADDICTION 90 tablet 0  . aspirin 81 MG tablet Take 81 mg by mouth at bedtime.     . Cholecalciferol (VITAMIN D3)  10000 UNITS TABS Take 10,000 Units by mouth 3 (three) times a week.     Marland Kitchen FLUZONE HIGH-DOSE 0.5 ML injection ADM 0.5ML IM UTD  0  . GILOTRIF 30 MG tablet TAKE 1 TABLET BY MOUTH ONCE DAILY. TAKE ON AN EMPTY STOMACH 1 HOUR BEFORE OR 2 HOURS AFTER A MEAL 30 tablet 2  . HYDROcodone-homatropine (HYCODAN) 5-1.5 MG/5ML syrup Take 5 mLs by mouth every 6 (six) hours as needed for cough. 120 mL 0  . ibuprofen (ADVIL,MOTRIN) 200 MG tablet Take 200 mg by mouth every 6 (six) hours as needed.    Marland Kitchen levothyroxine (SYNTHROID, LEVOTHROID) 50 MCG tablet TAKE 1 TABLET BY MOUTH EVERY DAY 90 tablet 0  . montelukast (SINGULAIR) 10 MG tablet TAKE 1 TABLET BY MOUTH EVERY DAY 90 tablet 0  . Cyanocobalamin (VITAMIN B 12 PO) Take 1 tablet by mouth daily.    Marland Kitchen ipratropium (ATROVENT) 0.03 % nasal spray Place 2 sprays into the nose 3 (three) times daily. (Patient not taking: Reported on 03/27/2018) 60 mL 3  . predniSONE (DELTASONE) 20 MG tablet 2 tablets daily for 3 days, 1 tablet daily for 4 days. 10 tablet 0  . promethazine-dextromethorphan (PROMETHAZINE-DM) 6.25-15 MG/5ML syrup Take 5 mLs by mouth 4 (four) times daily as needed for cough. 240 mL 1   No current facility-administered medications on file prior to visit.     Allergies  Allergen Reactions  . Penicillins Swelling and Rash    Current Problems (verified) Patient Active Problem List   Diagnosis Date Noted  . Aortic atherosclerosis ([place]) 05/29/2016  . Encounter for antineoplastic chemotherapy 10/19/2015  . Adenocarcinoma of left lung, stage 4 ([place]) 04/05/2015  . BMI 29.0-29.9,adult 02/25/2015  . Medication management 11/03/2014  . Vitamin D deficiency 10/21/2013  . Hypothyroidism 10/21/2013  . Essential hypertension 10/21/2013  . Hyperlipidemia 10/21/2013  . Other abnormal glucose   . Anxiety   . Insomnia     Screening Tests Immunization History  Administered Date(s) Administered  . Influenza, High Dose Seasonal PF 07/31/2014  .  Influenza-Unspecified 09/13/2015, 07/23/2016, 08/06/2017  . Pneumococcal Conjugate-13 12/24/2015  . Pneumococcal Polysaccharide-23 07/22/2012  . Td 12/25/2005  . Zoster 01/09/2007   Preventative care: Last colonoscopy: Jan 2014 -recc 10 yr f/u MGM 10/2016 due for follow  Prior vaccinations: TD : 12/25/2005 DUE, declines Influenza: HD 07/2017 Pneumococcal: 2013 Prevnar: 2017 Shingles/Zostavax: 01/09/2007  Names of Other Physician/Practitioners you currently use: 1. [facility] here for primary care 2. Dr Wyatt Portela, eye doctor, last visit 2019 3. Dr Dalene Carrow, DDS, dentist, last visit 2019   Patient Care Team: [name], MD as PCP - General (Internal Medicine) Sharol Roussel, [place] as Physician Assistant (Optometry) Jettie Booze, MD as Consulting Physician (Interventional Cardiology) Teena Irani, MD (Inactive) as Consulting Physician (Gastroenterology) Druscilla Brownie, MD as Consulting Physician (Dermatology) Clent Jacks, MD as Consulting Physician (Ophthalmology) Erroll Luna, MD as Consulting Physician (General Surgery)  SURGICAL HISTORY She  has a past surgical history that includes Tonsillectomy and adenoidectomy and Abdominal hysterectomy (  1995). FAMILY HISTORY Her family history includes ALS in her father; Alcohol abuse in her mother; Hypertension in her father; Liver disease in her mother. SOCIAL HISTORY She  reports that she quit smoking about 43 years ago. She has a 5.00 pack-year smoking history. She has never used smokeless tobacco. She reports that she does not drink alcohol or use drugs.   MEDICARE WELLNESS OBJECTIVES: Physical activity:   Cardiac risk factors:   Depression/mood screen:   Depression screen [facility] 2/9 03/27/2018  Decreased Interest 0  Down, Depressed, Hopeless 0  PHQ - 2 Score 0    ADLs:  In your present state of health, do you have any difficulty performing the following activities:  03/27/2018 06/14/2017  Hearing? N N  Vision? N N  Difficulty concentrating or making decisions? N N  Walking or climbing stairs? N N  Dressing or bathing? N N  Doing errands, shopping? N N  Preparing Food and eating ? N -  Using the Toilet? N -  In the past six months, have you accidently leaked urine? N -  Do you have problems with loss of bowel control? N -  Managing your Medications? N -  Managing your Finances? N -  Housekeeping or managing your Housekeeping? N -  Some recent data might be hidden     Cognitive Testing  Alert? Yes  Normal Appearance?Yes  Oriented to person? Yes  Place? Yes   Time? Yes  Recall of three objects?  Yes  Can perform simple calculations? Yes  Displays appropriate judgment?Yes  Can read the correct time from a watch face?Yes  EOL planning: Does Patient Have a Medical Advance Directive?: Yes Type of Advance Directive: Healthcare Power of Attorney, Living will Does patient want to make changes to medical advance directive?: No - Patient declined Copy of [place] in Chart?: No - copy requested  Review of Systems  Constitutional: Negative for malaise/fatigue and weight loss.  HENT: Negative for hearing loss and tinnitus.   Eyes: Negative for blurred vision and double vision.  Respiratory: Negative for cough, sputum production, shortness of breath and wheezing.   Cardiovascular: Negative for chest pain, palpitations, orthopnea, claudication, leg swelling and PND.  Gastrointestinal: Negative for abdominal pain, blood in stool, constipation, diarrhea, heartburn, melena, nausea and vomiting.  Genitourinary: Negative.   Musculoskeletal: Negative for falls, joint pain and myalgias.  Skin: Negative for rash.  Neurological: Negative for dizziness, tingling, sensory change, weakness and headaches.  Endo/Heme/Allergies: Negative for polydipsia.  Psychiatric/Behavioral: Negative.  Negative for depression, memory loss, substance abuse and  suicidal ideas. The patient is not nervous/anxious and does not have insomnia.   All other systems reviewed and are negative.    Objective:     Today's Vitals   03/27/18 1050  BP: 112/82  Pulse: 97  Temp: (!) 97.3 F (36.3 C)  SpO2: 97%  Weight: 155 lb (70.3 kg)  Height: 5' 0.5" (1.537 m)   Body mass index is 29.77 kg/m.  General appearance: alert, no distress, WD/WN, female HEENT: normocephalic, sclerae anicteric, TMs pearly, nares patent, no discharge or erythema, pharynx normal Oral cavity: MMM, no lesions Neck: supple, no lymphadenopathy, no thyromegaly, no masses Heart: RRR, normal S1, S2, no murmurs Lungs: CTA bilaterally, no wheezes, rhonchi, or rales Abdomen: +bs, soft, non tender, non distended, no masses, no hepatomegaly, no splenomegaly Musculoskeletal: nontender, no swelling, no obvious deformity Extremities: no edema, no cyanosis, no clubbing Pulses: 2+ symmetric, upper and lower extremities, normal cap refill Neurological:  alert, oriented x 3, CN2-12 intact, strength normal upper extremities and lower extremities, sensation normal throughout, DTRs 2+ throughout, no cerebellar signs, gait normal Psychiatric: normal affect, behavior normal, pleasant   Medicare Attestation I have personally reviewed: The patient's medical and social history Their use of alcohol, tobacco or illicit drugs Their current medications and supplements The patient's functional ability including ADLs,fall risks, home safety risks, cognitive, and hearing and visual impairment Diet and physical activities Evidence for depression or mood disorders  The patient's weight, height, BMI, and visual acuity have been recorded in the chart.  I have made referrals, counseling, and provided education to the patient based on review of the above and I have provided the patient with a written personalized care plan for preventive services.     Izora Ribas, NP   03/27/2018

## 2018-03-27 ENCOUNTER — Ambulatory Visit (INDEPENDENT_AMBULATORY_CARE_PROVIDER_SITE_OTHER): Payer: Medicare Other | Admitting: Adult Health

## 2018-03-27 ENCOUNTER — Encounter: Payer: Self-pay | Admitting: Adult Health

## 2018-03-27 VITALS — BP 112/82 | HR 97 | Temp 97.3°F | Ht 60.5 in | Wt 155.0 lb

## 2018-03-27 DIAGNOSIS — E559 Vitamin D deficiency, unspecified: Secondary | ICD-10-CM

## 2018-03-27 DIAGNOSIS — I700 Atherosclerosis of aorta: Secondary | ICD-10-CM

## 2018-03-27 DIAGNOSIS — C3492 Malignant neoplasm of unspecified part of left bronchus or lung: Secondary | ICD-10-CM | POA: Diagnosis not present

## 2018-03-27 DIAGNOSIS — R7309 Other abnormal glucose: Secondary | ICD-10-CM | POA: Diagnosis not present

## 2018-03-27 DIAGNOSIS — R6889 Other general symptoms and signs: Secondary | ICD-10-CM

## 2018-03-27 DIAGNOSIS — Z6829 Body mass index (BMI) 29.0-29.9, adult: Secondary | ICD-10-CM

## 2018-03-27 DIAGNOSIS — I10 Essential (primary) hypertension: Secondary | ICD-10-CM | POA: Diagnosis not present

## 2018-03-27 DIAGNOSIS — E039 Hypothyroidism, unspecified: Secondary | ICD-10-CM | POA: Diagnosis not present

## 2018-03-27 DIAGNOSIS — F419 Anxiety disorder, unspecified: Secondary | ICD-10-CM | POA: Diagnosis not present

## 2018-03-27 DIAGNOSIS — Z0001 Encounter for general adult medical examination with abnormal findings: Secondary | ICD-10-CM | POA: Diagnosis not present

## 2018-03-27 DIAGNOSIS — E782 Mixed hyperlipidemia: Secondary | ICD-10-CM

## 2018-03-27 DIAGNOSIS — G4700 Insomnia, unspecified: Secondary | ICD-10-CM | POA: Diagnosis not present

## 2018-03-27 DIAGNOSIS — Z79899 Other long term (current) drug therapy: Secondary | ICD-10-CM

## 2018-03-27 DIAGNOSIS — Z0000 Encounter for general adult medical examination without abnormal findings: Secondary | ICD-10-CM

## 2018-03-27 NOTE — Patient Instructions (Addendum)
Please drop off or mail copies of medical power of attorney/ living will  Consider adding a soluble fiber supplement (citrucel, benefiber, etc) - can help slow down diarrhea   Aim for 7+ servings of fruits and vegetables daily  80+ fluid ounces of water or unsweet tea for healthy kidneys  Limit alcohol intake  Limit animal fats in diet for cholesterol and heart health - choose grass fed whenever available  Aim for low stress - take time to unwind and care for your mental health  Aim for 150 min of moderate intensity exercise weekly for heart health, and weights twice weekly for bone health  Aim for 7-9 hours of sleep daily       When it comes to diets, agreement about the perfect plan isn't easy to find, even among the experts. Experts at the [employer] developed an idea known as the Healthy Eating Plate. Just imagine a plate divided into logical, healthy portions.  The emphasis is on diet quality:  Load up on vegetables and fruits - one-half of your plate: Aim for color and variety, and remember that potatoes don't count.  Go for whole grains - one-quarter of your plate: Whole wheat, barley, wheat berries, quinoa, oats, brown rice, and foods made with them. If you want pasta, go with whole wheat pasta.  Protein power - one-quarter of your plate: Fish, chicken, beans, and nuts are all healthy, versatile protein sources. Limit red meat.  The diet, however, does go beyond the plate, offering a few other suggestions.  Use healthy plant oils, such as olive, canola, soy, corn, sunflower and peanut. Check the labels, and avoid partially hydrogenated oil, which have unhealthy trans fats.  If you're thirsty, drink water. Coffee and tea are good in moderation, but skip sugary drinks and limit milk and dairy products to one or two daily servings.  The type of carbohydrate in the diet is more important than the amount. Some sources of carbohydrates, such as  vegetables, fruits, whole grains, and beans-are healthier than others.  Finally, stay active.

## 2018-03-28 LAB — HEMOGLOBIN A1C
Hgb A1c MFr Bld: 5.4 % of total Hgb (ref <5.7)
Mean Plasma Glucose: 108 (calc)
eAG (mmol/L): 6 (calc)

## 2018-03-28 LAB — CBC WITH DIFFERENTIAL/PLATELET
BASOS ABS: 52 {cells}/uL (ref 0–200)
BASOS PCT: 1 %
EOS PCT: 3.5 %
Eosinophils Absolute: 182 cells/uL (ref 15–500)
HEMATOCRIT: 38.3 % (ref 35.0–45.0)
HEMOGLOBIN: 12.6 g/dL (ref 11.7–15.5)
LYMPHS ABS: 1139 {cells}/uL (ref 850–3900)
MCH: 27.3 pg (ref 27.0–33.0)
MCHC: 32.9 g/dL (ref 32.0–36.0)
MCV: 82.9 fL (ref 80.0–100.0)
MPV: 12.6 fL — ABNORMAL HIGH (ref 7.5–12.5)
Monocytes Relative: 10.6 %
NEUTROS ABS: 3276 {cells}/uL (ref 1500–7800)
Neutrophils Relative %: 63 %
Platelets: 201 10*3/uL (ref 140–400)
RBC: 4.62 10*6/uL (ref 3.80–5.10)
RDW: 13.2 % (ref 11.0–15.0)
Total Lymphocyte: 21.9 %
WBC mixed population: 551 cells/uL (ref 200–950)
WBC: 5.2 10*3/uL (ref 3.8–10.8)

## 2018-03-28 LAB — COMPLETE METABOLIC PANEL WITH GFR
AG Ratio: 1.6 (calc) (ref 1.0–2.5)
ALT: 16 U/L (ref 6–29)
AST: 20 U/L (ref 10–35)
Albumin: 4 g/dL (ref 3.6–5.1)
Alkaline phosphatase (APISO): 64 U/L (ref 33–130)
BILIRUBIN TOTAL: 1 mg/dL (ref 0.2–1.2)
BUN/Creatinine Ratio: 16 (calc) (ref 6–22)
BUN: 17 mg/dL (ref 7–25)
CALCIUM: 9.1 mg/dL (ref 8.6–10.4)
CHLORIDE: 105 mmol/L (ref 98–110)
CO2: 29 mmol/L (ref 20–32)
CREATININE: 1.05 mg/dL — AB (ref 0.60–0.93)
GFR, EST AFRICAN AMERICAN: 62 mL/min/{1.73_m2} (ref >=60)
GFR, Est Non African American: 53 mL/min/{1.73_m2} — ABNORMAL LOW (ref >=60)
GLUCOSE: 82 mg/dL (ref 65–99)
Globulin: 2.5 g/dL (calc) (ref 1.9–3.7)
Potassium: 4.8 mmol/L (ref 3.5–5.3)
Sodium: 142 mmol/L (ref 135–146)
TOTAL PROTEIN: 6.5 g/dL (ref 6.1–8.1)

## 2018-03-28 LAB — LIPID PANEL
CHOL/HDL RATIO: 3.6 (calc) (ref <5.0)
Cholesterol: 182 mg/dL (ref <200)
HDL: 50 mg/dL — AB (ref >50)
LDL CHOLESTEROL (CALC): 112 mg/dL — AB
NON-HDL CHOLESTEROL (CALC): 132 mg/dL — AB (ref <130)
TRIGLYCERIDES: 97 mg/dL (ref <150)

## 2018-03-28 LAB — VITAMIN D 25 HYDROXY (VIT D DEFICIENCY, FRACTURES): VIT D 25 HYDROXY: 64 ng/mL (ref 30–100)

## 2018-03-28 LAB — TSH: TSH: 2.28 mIU/L (ref 0.40–4.50)

## 2018-04-02 ENCOUNTER — Other Ambulatory Visit: Payer: Self-pay | Admitting: Adult Health

## 2018-04-11 MED FILL — GILOTRIF 30 MG TABLET: 30 | 30 days supply | Qty: 30 | Fill #2

## 2018-04-29 ENCOUNTER — Encounter: Payer: Self-pay | Admitting: *Deleted

## 2018-04-29 ENCOUNTER — Inpatient Hospital Stay: Payer: Medicare Other | Attending: Internal Medicine | Admitting: Internal Medicine

## 2018-04-29 ENCOUNTER — Telehealth: Payer: Self-pay | Admitting: Internal Medicine

## 2018-04-29 ENCOUNTER — Encounter: Payer: Self-pay | Admitting: Internal Medicine

## 2018-04-29 ENCOUNTER — Inpatient Hospital Stay: Payer: Medicare Other

## 2018-04-29 VITALS — BP 141/96 | HR 92 | Temp 97.7°F | Resp 17 | Ht 60.5 in | Wt 156.5 lb

## 2018-04-29 DIAGNOSIS — I10 Essential (primary) hypertension: Secondary | ICD-10-CM | POA: Diagnosis not present

## 2018-04-29 DIAGNOSIS — Z7982 Long term (current) use of aspirin: Secondary | ICD-10-CM | POA: Diagnosis not present

## 2018-04-29 DIAGNOSIS — E079 Disorder of thyroid, unspecified: Secondary | ICD-10-CM | POA: Diagnosis not present

## 2018-04-29 DIAGNOSIS — Z79899 Other long term (current) drug therapy: Secondary | ICD-10-CM | POA: Diagnosis not present

## 2018-04-29 DIAGNOSIS — C3492 Malignant neoplasm of unspecified part of left bronchus or lung: Secondary | ICD-10-CM

## 2018-04-29 DIAGNOSIS — C7801 Secondary malignant neoplasm of right lung: Secondary | ICD-10-CM | POA: Insufficient documentation

## 2018-04-29 DIAGNOSIS — C3432 Malignant neoplasm of lower lobe, left bronchus or lung: Secondary | ICD-10-CM | POA: Diagnosis present

## 2018-04-29 DIAGNOSIS — C7802 Secondary malignant neoplasm of left lung: Secondary | ICD-10-CM | POA: Insufficient documentation

## 2018-04-29 DIAGNOSIS — F419 Anxiety disorder, unspecified: Secondary | ICD-10-CM | POA: Diagnosis not present

## 2018-04-29 LAB — CMP (CANCER CENTER ONLY)
ALBUMIN: 3.7 g/dL (ref 3.5–5.0)
ALK PHOS: 75 U/L (ref 38–126)
ALT: 25 U/L (ref 0–44)
ANION GAP: 6 (ref 5–15)
AST: 22 U/L (ref 15–41)
BILIRUBIN TOTAL: 1.1 mg/dL (ref 0.3–1.2)
BUN: 19 mg/dL (ref 8–23)
CALCIUM: 9.2 mg/dL (ref 8.9–10.3)
CO2: 29 mmol/L (ref 22–32)
Chloride: 104 mmol/L (ref 98–111)
Creatinine: 1.06 mg/dL — ABNORMAL HIGH (ref 0.44–1.00)
GFR, Est AFR Am: 60 mL/min — ABNORMAL LOW (ref >60)
GFR, Estimated: 52 mL/min — ABNORMAL LOW (ref >60)
GLUCOSE: 93 mg/dL (ref 70–99)
Potassium: 4.1 mmol/L (ref 3.5–5.1)
Sodium: 139 mmol/L (ref 135–145)
Total Protein: 6.9 g/dL (ref 6.5–8.1)

## 2018-04-29 LAB — CBC WITH DIFFERENTIAL (CANCER CENTER ONLY)
BASOS PCT: 1 %
Basophils Absolute: 0 10*3/uL (ref 0.0–0.1)
Eosinophils Absolute: 0.1 10*3/uL (ref 0.0–0.5)
Eosinophils Relative: 2 %
HEMATOCRIT: 39.9 % (ref 34.8–46.6)
HEMOGLOBIN: 12.9 g/dL (ref 11.6–15.9)
Lymphocytes Relative: 22 %
Lymphs Abs: 1.3 10*3/uL (ref 0.9–3.3)
MCH: 27.6 pg (ref 25.1–34.0)
MCHC: 32.3 g/dL (ref 31.5–36.0)
MCV: 85.3 fL (ref 79.5–101.0)
MONOS PCT: 9 %
Monocytes Absolute: 0.5 10*3/uL (ref 0.1–0.9)
NEUTROS ABS: 3.8 10*3/uL (ref 1.5–6.5)
NEUTROS PCT: 66 %
Platelet Count: 179 10*3/uL (ref 145–400)
RBC: 4.68 MIL/uL (ref 3.70–5.45)
RDW: 13.4 % (ref 11.2–14.5)
WBC Count: 5.7 10*3/uL (ref 3.9–10.3)

## 2018-04-29 NOTE — Progress Notes (Signed)
Oncology Nurse Navigator Documentation  Oncology Nurse Navigator Flowsheets 04/29/2018  Navigator Location [facility]  Navigator Encounter Type Clinic/MDC/I spoke with patient about her treatment. She is doing well without complaints.  She is getting her medication on time and the company calls to check on her every month.   Patient Visit Type MedOnc  Treatment Phase Treatment  Barriers/Navigation Needs No barriers at this time  Acuity Level 1  Acuity Level 1 Minimal follow up required  Time Spent with Patient 15

## 2018-04-29 NOTE — Progress Notes (Signed)
[place] Telephone:([phone]) [phone]   Fax:([phone]) [phone]  OFFICE PROGRESS NOTE  [name], MD 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  CURRENT THERAPY: Gilotrif 30 mg by mouth daily started 02/21/2016 status post 26 months of treatment.  INTERVAL HISTORY: Elizabeth [name] 72 y.o. female returns to the clinic today for follow-up visit.  The patient is feeling fine today with no specific complaints.  She continues to tolerate her treatment with GILOTRIF fairly well.  She denied having any chest pain, shortness breath, cough or hemoptysis.  She denied having any nausea, vomiting, diarrhea or constipation.  She has no concerning skin rash.  She has no significant weight loss or night sweats.  She is here today for evaluation and repeat blood work.  MEDICAL HISTORY: Past Medical History:  Diagnosis Date  . Adenocarcinoma of left lung, stage 4 ([place]) 04/05/2015   Biopsy confirmed CT SCAN: There are innumerable bilateral pulmonary nodules. These range in size from about 5 mm to 2 cm. They demonstrate irregular indistinct borders, the larger ones demonstrating spiculation. PET SCAN: Diffuse pulmonary metastatic disease with a 4 cm dominant left lower low lung lesion. No enlarged or hypermetabolic mediastinal or hilar adenopathy.  . Allergy   . Anxiety   . Cancer ([place])   . Change of skin related to chemotherapy 09/25/2017  . Drug-induced skin rash 01/17/2016  . Hyperlipidemia   . Hypertension   . Insomnia   . Prediabetes   . Thyroid disease      ALLERGIES:  is allergic to penicillins.  MEDICATIONS:  Current Outpatient Medications  Medication Sig Dispense Refill  . ALPRAZolam (XANAX) 1 MG tablet TAKE 1/2 TO 1 TABLET  2-3 TIMES DAILY ONLY IF NEEDED FOR ANXIETY ATTACK. LIMIT TO 5 DAYS PER WEEK TO AVOID ADDICTION 90 tablet 0  . aspirin 81 MG tablet Take 81 mg by mouth at bedtime.     . Cholecalciferol (VITAMIN D3) 10000 UNITS TABS Take 10,000 Units by mouth 3 (three) times a week.     . Cyanocobalamin (VITAMIN B 12 PO) Take 1 tablet by mouth daily.    Marland Kitchen GILOTRIF 30 MG tablet TAKE 1 TABLET BY MOUTH ONCE DAILY. TAKE ON AN EMPTY STOMACH 1 HOUR BEFORE OR 2 HOURS AFTER A MEAL 30 tablet 2  . HYDROcodone-homatropine (HYCODAN) 5-1.5 MG/5ML syrup Take 5 mLs by mouth every 6 (six) hours as needed for cough. 120 mL 0  . ibuprofen (ADVIL,MOTRIN) 200 MG tablet Take 200 mg by mouth every 6 (six) hours as needed.    Marland Kitchen levothyroxine (SYNTHROID, LEVOTHROID) 50 MCG tablet TAKE 1 TABLET BY MOUTH EVERY DAY 90 tablet 0  . montelukast (SINGULAIR) 10 MG tablet TAKE 1 TABLET BY MOUTH EVERY DAY 90 tablet 3   No current facility-administered medications for this visit.     SURGICAL HISTORY:  Past Surgical History:  Procedure Laterality Date  . ABDOMINAL HYSTERECTOMY  1995   w BSO  . TONSILLECTOMY AND ADENOIDECTOMY      REVIEW OF SYSTEMS:  A comprehensive review of systems was negative.   PHYSICAL  EXAMINATION: General appearance: alert, cooperative and no distress Head: Normocephalic, without obvious abnormality, atraumatic Neck: no adenopathy, no JVD, supple, symmetrical, trachea midline and thyroid not enlarged, symmetric, no tenderness/mass/nodules Lymph nodes: Cervical, supraclavicular, and axillary nodes normal. Resp: clear to auscultation bilaterally Back: symmetric, no curvature. ROM normal. No CVA tenderness. Cardio: regular rate and rhythm, S1, S2 normal, no murmur, click, rub or gallop GI: soft, non-tender; bowel sounds normal; no  masses,  no organomegaly Extremities: extremities normal, atraumatic, no cyanosis or edema  ECOG PERFORMANCE STATUS: 0 - Asymptomatic  Blood pressure (!) 141/96, pulse 92, temperature 97.7 F (36.5 C), temperature source Oral, resp. rate 17, height 5' 0.5" (1.537 m), weight 156 lb 8 oz (71 kg), SpO2 100 %.  LABORATORY DATA: Lab Results  Component Value Date   WBC 5.7 04/29/2018   HGB 12.9 04/29/2018   HCT 39.9 04/29/2018   MCV 85.3 04/29/2018   PLT 179 04/29/2018      Chemistry      Component Value Date/Time   NA 139 04/29/2018 0932   NA 139 10/26/2017 1252   K 4.1 04/29/2018 0932   K 4.1 10/26/2017 1252   CL 104 04/29/2018 0932   CO2 29 04/29/2018 0932   CO2 28 10/26/2017 1252   BUN 19 04/29/2018 0932   BUN 15.7 10/26/2017 1252   CREATININE 1.06 (H) 04/29/2018 0932   CREATININE 1.05 (H) 03/27/2018 1112   CREATININE 1.0 10/26/2017 1252      Component Value Date/Time   CALCIUM 9.2 04/29/2018 0932   CALCIUM 8.8 10/26/2017 1252   ALKPHOS 75 04/29/2018 0932   ALKPHOS 70 10/26/2017 1252   AST 22 04/29/2018 0932   AST 19 10/26/2017 1252   ALT 25 04/29/2018 0932   ALT 22 10/26/2017 1252   BILITOT 1.1 04/29/2018 0932   BILITOT 0.90 10/26/2017 1252       RADIOGRAPHIC STUDIES: No results found.  ASSESSMENT AND PLAN:  This is a very pleasant 72 years old white female with stage IV non-small cell lung cancer, adenocarcinoma with positive EGFR mutation with deletion in next 35 and currently undergoing treatment with Gilotrif initially at a dose of 40 mg for 9 months followed by 30 mg by mouth daily status post 26 months.  The patient continues to tolerate her treatment with GILOTRIF fairly well.  Her lab work is unremarkable today. I recommended for her treatment with the current dose. I will see her back for follow-up visit in 2 months for evaluation and repeat blood work. She was advised to call immediately if she has any concerning symptoms in the interval. The patient  voices understanding of current disease status and treatment options and is in agreement with the current care plan. All questions were answered. The patient knows to call the clinic with any problems, questions or concerns. We can certainly see the patient much sooner if necessary.  Disclaimer: This note was dictated with voice recognition software. Similar sounding words can inadvertently be transcribed and may not be corrected upon review.

## 2018-04-29 NOTE — Telephone Encounter (Signed)
scheduled appt per 7/8 los - gave patient aVS and calender per los.

## 2018-05-01 ENCOUNTER — Other Ambulatory Visit: Payer: Self-pay | Admitting: Internal Medicine

## 2018-05-02 ENCOUNTER — Other Ambulatory Visit: Payer: Self-pay | Admitting: Internal Medicine

## 2018-05-10 MED FILL — GILOTRIF 30 MG TABLET: 30 | 30 days supply | Qty: 30 | Fill #0

## 2018-06-10 MED FILL — GILOTRIF 30 MG TABLET: 30 | 30 days supply | Qty: 30 | Fill #1

## 2018-07-01 ENCOUNTER — Inpatient Hospital Stay: Payer: Medicare Other | Attending: Internal Medicine | Admitting: Internal Medicine

## 2018-07-01 ENCOUNTER — Telehealth: Payer: Self-pay | Admitting: Internal Medicine

## 2018-07-01 ENCOUNTER — Inpatient Hospital Stay: Payer: Medicare Other

## 2018-07-01 ENCOUNTER — Encounter: Payer: Self-pay | Admitting: Internal Medicine

## 2018-07-01 VITALS — BP 129/75 | HR 84 | Temp 97.9°F | Resp 18 | Ht 60.5 in | Wt 156.3 lb

## 2018-07-01 DIAGNOSIS — C3432 Malignant neoplasm of lower lobe, left bronchus or lung: Secondary | ICD-10-CM | POA: Diagnosis not present

## 2018-07-01 DIAGNOSIS — C3492 Malignant neoplasm of unspecified part of left bronchus or lung: Secondary | ICD-10-CM

## 2018-07-01 DIAGNOSIS — Z79899 Other long term (current) drug therapy: Secondary | ICD-10-CM | POA: Diagnosis not present

## 2018-07-01 DIAGNOSIS — Z5111 Encounter for antineoplastic chemotherapy: Secondary | ICD-10-CM

## 2018-07-01 DIAGNOSIS — I10 Essential (primary) hypertension: Secondary | ICD-10-CM

## 2018-07-01 DIAGNOSIS — C3490 Malignant neoplasm of unspecified part of unspecified bronchus or lung: Secondary | ICD-10-CM

## 2018-07-01 LAB — CBC WITH DIFFERENTIAL (CANCER CENTER ONLY)
BASOS PCT: 1 %
Basophils Absolute: 0 10*3/uL (ref 0.0–0.1)
EOS ABS: 0.2 10*3/uL (ref 0.0–0.5)
EOS PCT: 4 %
HEMATOCRIT: 38.2 % (ref 34.8–46.6)
Hemoglobin: 12.4 g/dL (ref 11.6–15.9)
Lymphocytes Relative: 22 %
Lymphs Abs: 1.1 10*3/uL (ref 0.9–3.3)
MCH: 27.6 pg (ref 25.1–34.0)
MCHC: 32.5 g/dL (ref 31.5–36.0)
MCV: 85.1 fL (ref 79.5–101.0)
MONO ABS: 0.5 10*3/uL (ref 0.1–0.9)
MONOS PCT: 9 %
NEUTROS ABS: 3.3 10*3/uL (ref 1.5–6.5)
Neutrophils Relative %: 64 %
Platelet Count: 164 10*3/uL (ref 145–400)
RBC: 4.49 MIL/uL (ref 3.70–5.45)
RDW: 13.4 % (ref 11.2–14.5)
WBC Count: 5.1 10*3/uL (ref 3.9–10.3)

## 2018-07-01 LAB — CMP (CANCER CENTER ONLY)
ALBUMIN: 3.6 g/dL (ref 3.5–5.0)
ALK PHOS: 76 U/L (ref 38–126)
ALT: 23 U/L (ref 0–44)
AST: 21 U/L (ref 15–41)
Anion gap: 8 (ref 5–15)
BUN: 16 mg/dL (ref 8–23)
CALCIUM: 9.1 mg/dL (ref 8.9–10.3)
CO2: 28 mmol/L (ref 22–32)
CREATININE: 1.03 mg/dL — AB (ref 0.44–1.00)
Chloride: 107 mmol/L (ref 98–111)
GFR, Est AFR Am: >60 mL/min (ref >60)
GFR, Estimated: 53 mL/min — ABNORMAL LOW (ref >60)
GLUCOSE: 85 mg/dL (ref 70–99)
Potassium: 3.9 mmol/L (ref 3.5–5.1)
Sodium: 143 mmol/L (ref 135–145)
Total Bilirubin: 1 mg/dL (ref 0.3–1.2)
Total Protein: 6.7 g/dL (ref 6.5–8.1)

## 2018-07-01 NOTE — Progress Notes (Signed)
[facility] [phone]) [phone]   [phone]  OFFICE PROGRESS NOTE  [name], MD [address] [place] [address]  DIAGNOSIS: Stage IV (T2a, N0, M1a) non-small cell lung cancer, adenocarcinoma with positive EGFR mutation with deletion in exon 19 diagnosed in June 2016 and presented with large mass in the left lower lobe in addition to multiple bilateral pulmonary nodules  PRIOR THERAPY: Gilotrif 40 mg by mouth daily started 05/11/2015, status post 9 months of treatment discontinued on 02/17/2016 secondary to extensive skin rash.  CURRENT THERAPY: Gilotrif 30 mg by mouth daily started 02/21/2016 status post 28 months of treatment.  INTERVAL HISTORY: Elizabeth [name] 72 y.o. female returns to the clinic today for follow-up visit.  The patient is feeling fine today with no concerning complaints.  She denied having any skin rash but has intermittent episodes of diarrhea depending on her meals.  Denied having any chest pain, shortness of breath, cough or hemoptysis.  She denied having any fever or chills.  She has no nausea, vomiting or constipation.  She denied having any significant weight loss.  She continues to tolerate her treatment with GILOTRIF fairly well.  She is here today for evaluation and repeat blood work.  MEDICAL HISTORY: Past Medical History:  Diagnosis Date  . Adenocarcinoma of left lung, stage 4 ([facility]) 04/05/2015   Biopsy confirmed CT SCAN: There are innumerable bilateral pulmonary nodules. These range in size from about 5 mm to 2 cm. They demonstrate irregular indistinct borders, the larger ones demonstrating spiculation. PET SCAN: Diffuse pulmonary metastatic disease with a 4 cm dominant left lower low lung lesion. No enlarged or hypermetabolic mediastinal or hilar adenopathy.  . Allergy   . Anxiety   . Cancer ([place])   . Change of skin related to chemotherapy 09/25/2017  . Drug-induced skin rash 01/17/2016  .  Hyperlipidemia   . Hypertension   . Insomnia   . Prediabetes   . Thyroid disease     ALLERGIES:  is allergic to penicillins.  MEDICATIONS:  Current Outpatient Medications  Medication Sig Dispense Refill  . ALPRAZolam (XANAX) 1 MG tablet Take 1/2 to 1 tablet 2 to 3 x / day ONLY if needed for Anxiety Attack & please try to limit to 5 days /week to avoid addiction 90 tablet 0  . aspirin 81 MG tablet Take 81 mg by mouth at bedtime.     . Cholecalciferol (VITAMIN D3) 10000 UNITS TABS Take 10,000 Units by mouth 3 (three) times a week.     . Cyanocobalamin (VITAMIN B 12 PO) Take 1 tablet by mouth daily.    Marland Kitchen GILOTRIF 30 MG tablet TAKE 1 TABLET BY MOUTH ONCE DAILY. TAKE ON AN EMPTY STOMACH 1 HOUR BEFORE OR 2 HOURS AFTER A MEAL 30 tablet 2  . HYDROcodone-homatropine (HYCODAN) 5-1.5 MG/5ML syrup Take 5 mLs by mouth every 6 (six) hours as needed for cough. (Patient not taking: Reported on 04/29/2018) 120 mL 0  . ibuprofen (ADVIL,MOTRIN) 200 MG tablet Take 200 mg by mouth every 6 (six) hours as needed.    Marland Kitchen levothyroxine (SYNTHROID, LEVOTHROID) 50 MCG tablet TAKE 1 TABLET BY MOUTH EVERY DAY 90 tablet 0  . montelukast (SINGULAIR) 10 MG tablet TAKE 1 TABLET BY MOUTH EVERY DAY 90 tablet 3   No current facility-administered medications for this visit.     SURGICAL HISTORY:  Past Surgical History:  Procedure Laterality Date  . ABDOMINAL HYSTERECTOMY  1995   w  BSO  . TONSILLECTOMY AND ADENOIDECTOMY      REVIEW OF SYSTEMS:  A comprehensive review of systems was negative except for: Gastrointestinal: positive for diarrhea   PHYSICAL EXAMINATION: General appearance: alert, cooperative and no distress Head: Normocephalic, without obvious abnormality, atraumatic Neck: no adenopathy, no JVD, supple, symmetrical, trachea midline and thyroid not enlarged, symmetric, no tenderness/mass/nodules Lymph nodes: Cervical, supraclavicular, and axillary nodes normal. Resp: clear to auscultation bilaterally Back:  symmetric, no curvature. ROM normal. No CVA tenderness. Cardio: regular rate and rhythm, S1, S2 normal, no murmur, click, rub or gallop GI: soft, non-tender; bowel sounds normal; no masses,  no organomegaly Extremities: extremities normal, atraumatic, no cyanosis or edema  ECOG PERFORMANCE STATUS: 0 - Asymptomatic  Blood pressure 129/75, pulse 84, temperature 97.9 F (36.6 C), resp. rate 18, height 5' 0.5" (1.537 m), weight 156 lb 4.8 oz (70.9 kg), SpO2 100 %.  LABORATORY DATA: Lab Results  Component Value Date   WBC 5.1 07/01/2018   HGB 12.4 07/01/2018   HCT 38.2 07/01/2018   MCV 85.1 07/01/2018   PLT 164 07/01/2018      Chemistry      Component Value Date/Time   NA 139 04/29/2018 0932   NA 139 10/26/2017 1252   K 4.1 04/29/2018 0932   K 4.1 10/26/2017 1252   CL 104 04/29/2018 0932   CO2 29 04/29/2018 0932   CO2 28 10/26/2017 1252   BUN 19 04/29/2018 0932   BUN 15.7 10/26/2017 1252   CREATININE 1.06 (H) 04/29/2018 0932   CREATININE 1.05 (H) 03/27/2018 1112   CREATININE 1.0 10/26/2017 1252      Component Value Date/Time   CALCIUM 9.2 04/29/2018 0932   CALCIUM 8.8 10/26/2017 1252   ALKPHOS 75 04/29/2018 0932   ALKPHOS 70 10/26/2017 1252   AST 22 04/29/2018 0932   AST 19 10/26/2017 1252   ALT 25 04/29/2018 0932   ALT 22 10/26/2017 1252   BILITOT 1.1 04/29/2018 0932   BILITOT 0.90 10/26/2017 1252       RADIOGRAPHIC STUDIES: No results found.  ASSESSMENT AND PLAN:  This is a very pleasant 72 years old white female with stage IV non-small cell lung cancer, adenocarcinoma with positive EGFR mutation with deletion in exon 19 and currently undergoing treatment with Gilotrif initially at a dose of 40 mg for 9 months followed by 30 mg by mouth daily status post 28 months.  The patient continues to tolerate her treatment well. CBC today showed findings.  Comprehensive metabolic panel is a still pending. I recommended for the patient to continue her current treatment  with GILOTRIF 30 mg p.o. daily. I will see the patient back for follow-up visit in 2 months for evaluation with repeat CT scan of the chest, abdomen and pelvis for restaging of her disease. She was advised to call immediately if she has any concerning symptoms in the interval. The patient voices understanding of current disease status and treatment options and is in agreement with the current care plan. All questions were answered. The patient knows to call the clinic with any problems, questions or concerns. We can certainly see the patient much sooner if necessary.  Disclaimer: This note was dictated with voice recognition software. Similar sounding words can inadvertently be transcribed and may not be corrected upon review.

## 2018-07-01 NOTE — Telephone Encounter (Signed)
Scheduled appt per 9/9 los - gave patient aVS and calender per los.   

## 2018-07-08 ENCOUNTER — Other Ambulatory Visit: Payer: Self-pay | Admitting: Internal Medicine

## 2018-07-12 MED FILL — GILOTRIF 30 MG TABLET: 30 | 30 days supply | Qty: 30 | Fill #2

## 2018-07-17 ENCOUNTER — Other Ambulatory Visit: Payer: Self-pay | Admitting: Physician Assistant

## 2018-08-02 ENCOUNTER — Other Ambulatory Visit: Payer: Self-pay | Admitting: Internal Medicine

## 2018-08-08 MED FILL — GILOTRIF 30 MG TABLET: 30 | 30 days supply | Qty: 30 | Fill #0

## 2018-08-30 ENCOUNTER — Encounter (HOSPITAL_COMMUNITY): Payer: Self-pay

## 2018-08-30 ENCOUNTER — Ambulatory Visit (HOSPITAL_COMMUNITY)
Admission: RE | Admit: 2018-08-30 | Discharge: 2018-08-30 | Disposition: A | Discharge status: Home / Self Care | Payer: Medicare Other | Source: Ambulatory Visit | Attending: Internal Medicine | Admitting: Internal Medicine

## 2018-08-30 ENCOUNTER — Inpatient Hospital Stay: Payer: Medicare Other | Attending: Internal Medicine

## 2018-08-30 DIAGNOSIS — F419 Anxiety disorder, unspecified: Secondary | ICD-10-CM | POA: Insufficient documentation

## 2018-08-30 DIAGNOSIS — C3490 Malignant neoplasm of unspecified part of unspecified bronchus or lung: Secondary | ICD-10-CM

## 2018-08-30 DIAGNOSIS — C3432 Malignant neoplasm of lower lobe, left bronchus or lung: Secondary | ICD-10-CM | POA: Diagnosis present

## 2018-08-30 DIAGNOSIS — D1803 Hemangioma of intra-abdominal structures: Secondary | ICD-10-CM | POA: Diagnosis not present

## 2018-08-30 DIAGNOSIS — Z79899 Other long term (current) drug therapy: Secondary | ICD-10-CM | POA: Diagnosis not present

## 2018-08-30 DIAGNOSIS — Z7982 Long term (current) use of aspirin: Secondary | ICD-10-CM | POA: Insufficient documentation

## 2018-08-30 DIAGNOSIS — J479 Bronchiectasis, uncomplicated: Secondary | ICD-10-CM | POA: Diagnosis not present

## 2018-08-30 DIAGNOSIS — R918 Other nonspecific abnormal finding of lung field: Secondary | ICD-10-CM | POA: Diagnosis not present

## 2018-08-30 DIAGNOSIS — R21 Rash and other nonspecific skin eruption: Secondary | ICD-10-CM | POA: Diagnosis not present

## 2018-08-30 LAB — CBC WITH DIFFERENTIAL (CANCER CENTER ONLY)
ABS IMMATURE GRANULOCYTES: 0.01 10*3/uL (ref 0.00–0.07)
BASOS ABS: 0 10*3/uL (ref 0.0–0.1)
BASOS PCT: 1 %
Eosinophils Absolute: 0.2 10*3/uL (ref 0.0–0.5)
Eosinophils Relative: 4 %
HCT: 39 % (ref 36.0–46.0)
HEMOGLOBIN: 12.5 g/dL (ref 12.0–15.0)
Immature Granulocytes: 0 %
Lymphocytes Relative: 22 %
Lymphs Abs: 1.2 10*3/uL (ref 0.7–4.0)
MCH: 27.4 pg (ref 26.0–34.0)
MCHC: 32.1 g/dL (ref 30.0–36.0)
MCV: 85.5 fL (ref 80.0–100.0)
Monocytes Absolute: 0.6 10*3/uL (ref 0.1–1.0)
Monocytes Relative: 12 %
NEUTROS ABS: 3.2 10*3/uL (ref 1.7–7.7)
NEUTROS PCT: 61 %
NRBC: 0 % (ref 0.0–0.2)
PLATELETS: 175 10*3/uL (ref 150–400)
RBC: 4.56 MIL/uL (ref 3.87–5.11)
RDW: 12.9 % (ref 11.5–15.5)
WBC: 5.3 10*3/uL (ref 4.0–10.5)

## 2018-08-30 LAB — CMP (CANCER CENTER ONLY)
ALK PHOS: 77 U/L (ref 38–126)
ALT: 19 U/L (ref 0–44)
ANION GAP: 7 (ref 5–15)
AST: 21 U/L (ref 15–41)
Albumin: 3.4 g/dL — ABNORMAL LOW (ref 3.5–5.0)
BUN: 16 mg/dL (ref 8–23)
CO2: 28 mmol/L (ref 22–32)
Calcium: 9.2 mg/dL (ref 8.9–10.3)
Chloride: 107 mmol/L (ref 98–111)
Creatinine: 1.1 mg/dL — ABNORMAL HIGH (ref 0.44–1.00)
GFR, EST AFRICAN AMERICAN: 57 mL/min — AB (ref >60)
GFR, EST NON AFRICAN AMERICAN: 49 mL/min — AB (ref >60)
Glucose, Bld: 84 mg/dL (ref 70–99)
Potassium: 4.4 mmol/L (ref 3.5–5.1)
SODIUM: 142 mmol/L (ref 135–145)
Total Bilirubin: 0.9 mg/dL (ref 0.3–1.2)
Total Protein: 6.8 g/dL (ref 6.5–8.1)

## 2018-08-30 MED ORDER — IOHEXOL 300 MG/ML  SOLN
100.0000 mL | Freq: Once | INTRAMUSCULAR | Status: AC | PRN
  Administered 2018-08-30: 100 mL via INTRAVENOUS

## 2018-08-30 MED ORDER — SODIUM CHLORIDE (PF) 0.9 % IJ SOLN
INTRAMUSCULAR | Status: AC
  Filled 2018-08-30: qty 50

## 2018-09-01 ENCOUNTER — Other Ambulatory Visit: Payer: Self-pay | Admitting: Internal Medicine

## 2018-09-03 ENCOUNTER — Encounter: Payer: Self-pay | Admitting: Internal Medicine

## 2018-09-03 ENCOUNTER — Telehealth: Payer: Self-pay | Admitting: Internal Medicine

## 2018-09-03 ENCOUNTER — Inpatient Hospital Stay (HOSPITAL_BASED_OUTPATIENT_CLINIC_OR_DEPARTMENT_OTHER): Payer: Medicare Other | Admitting: Internal Medicine

## 2018-09-03 VITALS — BP 137/79 | HR 64 | Temp 97.8°F | Resp 17 | Ht 60.5 in | Wt 157.4 lb

## 2018-09-03 DIAGNOSIS — Z7982 Long term (current) use of aspirin: Secondary | ICD-10-CM | POA: Diagnosis not present

## 2018-09-03 DIAGNOSIS — Z79899 Other long term (current) drug therapy: Secondary | ICD-10-CM

## 2018-09-03 DIAGNOSIS — R21 Rash and other nonspecific skin eruption: Secondary | ICD-10-CM

## 2018-09-03 DIAGNOSIS — F419 Anxiety disorder, unspecified: Secondary | ICD-10-CM

## 2018-09-03 DIAGNOSIS — C3432 Malignant neoplasm of lower lobe, left bronchus or lung: Secondary | ICD-10-CM

## 2018-09-03 DIAGNOSIS — I10 Essential (primary) hypertension: Secondary | ICD-10-CM

## 2018-09-03 DIAGNOSIS — Z5111 Encounter for antineoplastic chemotherapy: Secondary | ICD-10-CM

## 2018-09-03 DIAGNOSIS — C3492 Malignant neoplasm of unspecified part of left bronchus or lung: Secondary | ICD-10-CM

## 2018-09-03 NOTE — Progress Notes (Signed)
[facility] Telephone:(336) [phone]   Fax:(336) [phone]  OFFICE PROGRESS NOTE  [name], MD [address] [place] [address]  DIAGNOSIS: Stage IV (T2a, N0, M1a) non-small cell lung cancer, adenocarcinoma with positive EGFR mutation with deletion in exon 19 diagnosed in June 2016 and presented with large mass in the left lower lobe in addition to multiple bilateral pulmonary nodules  PRIOR THERAPY: Gilotrif 40 mg by mouth daily started 05/11/2015, status post 9 months of treatment discontinued on 02/17/2016 secondary to extensive skin rash.  CURRENT THERAPY: Gilotrif 30 mg by mouth daily started 02/21/2016 status post 30 months of treatment.  INTERVAL HISTORY: Elizabeth [name] 72 y.o. female returns to the clinic today for follow-up visit accompanied by her husband.  The patient is feeling fine today with no concerning complaints except for few spots of skin rash on the upper extremities.  She denied having any diarrhea.  She denied having any chest pain, shortness breath, cough or hemoptysis.  She has no nausea, vomiting, diarrhea or constipation.  She denied having any significant weight loss or night sweats.  She has no headache or visual changes.  She had repeat CT scan of the chest, abdomen and pelvis performed recently and she is here for evaluation and discussion of her risk her results.  Her husband was recently diagnosed with carcinoid tumor of the GI tract.  MEDICAL HISTORY: Past Medical History:  Diagnosis Date  . Adenocarcinoma of left lung, stage 4 ([facility]) 04/05/2015   Biopsy confirmed CT SCAN: There are innumerable bilateral pulmonary nodules. These range in size from about 5 mm to 2 cm. They demonstrate irregular indistinct borders, the larger ones demonstrating spiculation. PET SCAN: Diffuse pulmonary metastatic disease with a 4 cm dominant left lower low lung lesion. No enlarged or hypermetabolic mediastinal or hilar adenopathy.  .  Allergy   . Anxiety   . Cancer ([place])   . Change of skin related to chemotherapy 09/25/2017  . Drug-induced skin rash 01/17/2016  . Hyperlipidemia   . Hypertension   . Insomnia   . Prediabetes   . Thyroid disease     ALLERGIES:  is allergic to penicillins.  MEDICATIONS:  Current Outpatient Medications  Medication Sig Dispense Refill  . ALPRAZolam (XANAX) 1 MG tablet TAKE 1/2 TO 1 TABLET BY MOUTH 2-3 TIMES DAILY ONLY IF NEEDED FOR ANXIETY ATTACK. LIMIT TO 5 DAYS PER WEEK TO AVOID ADDICTION 90 tablet 0  . aspirin 81 MG tablet Take 81 mg by mouth at bedtime.     . Cholecalciferol (VITAMIN D3) 10000 UNITS TABS Take 10,000 Units by mouth 3 (three) times a week.     . Cyanocobalamin (VITAMIN B 12 PO) Take 1 tablet by mouth daily.    Marland Kitchen doxycycline (VIBRA-TABS) 100 MG tablet TK 1 T PO QD  1  . GILOTRIF 30 MG tablet TAKE 1 TABLET (30MG) BY MOUTH ONCE DAILY. TAKE ON AN EMPTY STOMACH 1 HOUR BEFORE OR 2 HOURS AFTER A MEAL 30 tablet 2  . HYDROcodone-homatropine (HYCODAN) 5-1.5 MG/5ML syrup Take 5 mLs by mouth every 6 (six) hours as needed for cough. 120 mL 0  . ibuprofen (ADVIL,MOTRIN) 200 MG tablet Take 200 mg by mouth every 6 (six) hours as needed.    Marland Kitchen levothyroxine (SYNTHROID, LEVOTHROID) 50 MCG tablet TAKE 1 TABLET BY MOUTH EVERY DAY 90 tablet 0  . montelukast (SINGULAIR) 10 MG tablet TAKE 1 TABLET BY MOUTH EVERY DAY 90 tablet 3  No current facility-administered medications for this visit.     SURGICAL HISTORY:  Past Surgical History:  Procedure Laterality Date  . ABDOMINAL HYSTERECTOMY  1995   w BSO  . TONSILLECTOMY AND ADENOIDECTOMY      REVIEW OF SYSTEMS:  Constitutional: negative Eyes: negative Ears, nose, mouth, throat, and face: negative Respiratory: negative Cardiovascular: negative Gastrointestinal: negative Genitourinary:negative Integument/breast: negative Hematologic/lymphatic: negative Musculoskeletal:negative Neurological: negative Behavioral/Psych:  negative Endocrine: negative Allergic/Immunologic: negative   PHYSICAL EXAMINATION: General appearance: alert, cooperative and no distress Head: Normocephalic, without obvious abnormality, atraumatic Neck: no adenopathy, no JVD, supple, symmetrical, trachea midline and thyroid not enlarged, symmetric, no tenderness/mass/nodules Lymph nodes: Cervical, supraclavicular, and axillary nodes normal. Resp: clear to auscultation bilaterally Back: symmetric, no curvature. ROM normal. No CVA tenderness. Cardio: regular rate and rhythm, S1, S2 normal, no murmur, click, rub or gallop GI: soft, non-tender; bowel sounds normal; no masses,  no organomegaly Extremities: extremities normal, atraumatic, no cyanosis or edema Neurologic: Alert and oriented X 3, normal strength and tone. Normal symmetric reflexes. Normal coordination and gait  ECOG PERFORMANCE STATUS: 0 - Asymptomatic  Blood pressure 137/79, pulse 64, temperature 97.8 F (36.6 C), temperature source Oral, resp. rate 17, height 5' 0.5" (1.537 m), weight 157 lb 6.4 oz (71.4 kg), SpO2 100 %.  LABORATORY DATA: Lab Results  Component Value Date   WBC 5.3 08/30/2018   HGB 12.5 08/30/2018   HCT 39.0 08/30/2018   MCV 85.5 08/30/2018   PLT 175 08/30/2018      Chemistry      Component Value Date/Time   NA 142 08/30/2018 0845   NA 139 10/26/2017 1252   K 4.4 08/30/2018 0845   K 4.1 10/26/2017 1252   CL 107 08/30/2018 0845   CO2 28 08/30/2018 0845   CO2 28 10/26/2017 1252   BUN 16 08/30/2018 0845   BUN 15.7 10/26/2017 1252   CREATININE 1.10 (H) 08/30/2018 0845   CREATININE 1.05 (H) 03/27/2018 1112   CREATININE 1.0 10/26/2017 1252      Component Value Date/Time   CALCIUM 9.2 08/30/2018 0845   CALCIUM 8.8 10/26/2017 1252   ALKPHOS 77 08/30/2018 0845   ALKPHOS 70 10/26/2017 1252   AST 21 08/30/2018 0845   AST 19 10/26/2017 1252   ALT 19 08/30/2018 0845   ALT 22 10/26/2017 1252   BILITOT 0.9 08/30/2018 0845   BILITOT 0.90  10/26/2017 1252       RADIOGRAPHIC STUDIES: Ct Chest W Contrast  Result Date: 08/30/2018 CLINICAL DATA:  Lung cancer. EXAM: CT CHEST, ABDOMEN, AND PELVIS WITH CONTRAST TECHNIQUE: Multidetector CT imaging of the chest, abdomen and pelvis was performed following the standard protocol during bolus administration of intravenous contrast. CONTRAST:  140m OMNIPAQUE IOHEXOL 300 MG/ML  SOLN COMPARISON:  Chest CT 02/22/2018.  Abdomen and pelvis CT 07/28/2016. FINDINGS: CT CHEST FINDINGS Cardiovascular: The heart size is normal. No substantial pericardial effusion. Coronary artery calcification is evident. No thoracic aortic aneurysm. Mediastinum/Nodes: Tiny left thyroid nodule evident. No mediastinal lymphadenopathy. There is no hilar lymphadenopathy. Small hiatal hernia. The esophagus has normal imaging features. There is no axillary lymphadenopathy. Lungs/Pleura: Scattered areas of nodular architectural distortion and ground-glass attenuation are similar to prior. Index right lower lobe lesion measured at 9 mm previously is stable at 9 mm. The left lower lobe volume loss with masslike collapse/consolidation is stable in the interval. No pleural effusion. Musculoskeletal: No worrisome lytic or sclerotic osseous abnormality. CT ABDOMEN PELVIS FINDINGS Hepatobiliary: Stable 4.0 cm cavernous hemangioma posterior right  hepatic dome. There is no evidence for gallstones, gallbladder wall thickening, or pericholecystic fluid. No intrahepatic or extrahepatic biliary dilation. Pancreas: No focal mass lesion. No dilatation of the main duct. No intraparenchymal cyst. No peripancreatic edema. Spleen: No splenomegaly. No focal mass lesion. Adrenals/Urinary Tract: No adrenal nodule or mass. Kidneys are unremarkable. No evidence for hydroureter. The urinary bladder appears normal for the degree of distention. Stomach/Bowel: Small hiatal hernia. Stomach otherwise unremarkable. Duodenum is normally positioned as is the ligament of  Treitz. No small bowel wall thickening. No small bowel dilatation. The terminal ileum is normal. The appendix is normal. No gross colonic mass. No colonic wall thickening. Diverticular changes are noted in the left colon without evidence of diverticulitis. Vascular/Lymphatic: No abdominal aortic aneurysm. No abdominal lymphadenopathy. No pelvic sidewall lymphadenopathy. Reproductive: Uterus surgically absent.  There is no adnexal mass. Other: No intraperitoneal free fluid. Musculoskeletal: Sclerotic focus in the left pubic bone is stable since 07/28/2016. No worrisome lytic or sclerotic osseous abnormality. IMPRESSION: 1. Stable exam without new or progressive findings. 2. The numerous tiny areas of architectural distortion with interstitial/ground-glass opacity in the lungs are stable in the interval. These have previously been characterized as treated metastases. 3. Central left lower lobe masslike area of collapse/consolidation with bronchiectasis is stable. 4. No change benign cavernous hemangioma of the liver. Electronically Signed   By: Misty Stanley M.D.   On: 08/30/2018 16:41   Ct Abdomen Pelvis W Contrast  Result Date: 08/30/2018 CLINICAL DATA:  Lung cancer. EXAM: CT CHEST, ABDOMEN, AND PELVIS WITH CONTRAST TECHNIQUE: Multidetector CT imaging of the chest, abdomen and pelvis was performed following the standard protocol during bolus administration of intravenous contrast. CONTRAST:  146m OMNIPAQUE IOHEXOL 300 MG/ML  SOLN COMPARISON:  Chest CT 02/22/2018.  Abdomen and pelvis CT 07/28/2016. FINDINGS: CT CHEST FINDINGS Cardiovascular: The heart size is normal. No substantial pericardial effusion. Coronary artery calcification is evident. No thoracic aortic aneurysm. Mediastinum/Nodes: Tiny left thyroid nodule evident. No mediastinal lymphadenopathy. There is no hilar lymphadenopathy. Small hiatal hernia. The esophagus has normal imaging features. There is no axillary lymphadenopathy. Lungs/Pleura:  Scattered areas of nodular architectural distortion and ground-glass attenuation are similar to prior. Index right lower lobe lesion measured at 9 mm previously is stable at 9 mm. The left lower lobe volume loss with masslike collapse/consolidation is stable in the interval. No pleural effusion. Musculoskeletal: No worrisome lytic or sclerotic osseous abnormality. CT ABDOMEN PELVIS FINDINGS Hepatobiliary: Stable 4.0 cm cavernous hemangioma posterior right hepatic dome. There is no evidence for gallstones, gallbladder wall thickening, or pericholecystic fluid. No intrahepatic or extrahepatic biliary dilation. Pancreas: No focal mass lesion. No dilatation of the main duct. No intraparenchymal cyst. No peripancreatic edema. Spleen: No splenomegaly. No focal mass lesion. Adrenals/Urinary Tract: No adrenal nodule or mass. Kidneys are unremarkable. No evidence for hydroureter. The urinary bladder appears normal for the degree of distention. Stomach/Bowel: Small hiatal hernia. Stomach otherwise unremarkable. Duodenum is normally positioned as is the ligament of Treitz. No small bowel wall thickening. No small bowel dilatation. The terminal ileum is normal. The appendix is normal. No gross colonic mass. No colonic wall thickening. Diverticular changes are noted in the left colon without evidence of diverticulitis. Vascular/Lymphatic: No abdominal aortic aneurysm. No abdominal lymphadenopathy. No pelvic sidewall lymphadenopathy. Reproductive: Uterus surgically absent.  There is no adnexal mass. Other: No intraperitoneal free fluid. Musculoskeletal: Sclerotic focus in the left pubic bone is stable since 07/28/2016. No worrisome lytic or sclerotic osseous abnormality. IMPRESSION: 1. Stable exam without  new or progressive findings. 2. The numerous tiny areas of architectural distortion with interstitial/ground-glass opacity in the lungs are stable in the interval. These have previously been characterized as treated metastases.  3. Central left lower lobe masslike area of collapse/consolidation with bronchiectasis is stable. 4. No change benign cavernous hemangioma of the liver. Electronically Signed   By: Misty Stanley M.D.   On: 08/30/2018 16:41    ASSESSMENT AND PLAN:  This is a very pleasant 72 years old white female with stage IV non-small cell lung cancer, adenocarcinoma with positive EGFR mutation with deletion in exon 19 and currently undergoing treatment with Gilotrif initially at a dose of 40 mg for 9 months followed by 30 mg by mouth daily status post 30 months.  The patient has been tolerating her treatment very well with no concerning adverse effects. She had repeat CT scan of the chest, abdomen and pelvis performed recently.  I personally and independently reviewed the scans and discussed the results with the patient and her husband.  Her scan showed no concerning findings for disease progression. I recommended for the patient to continue her current treatment with GILOTRIF with the same dose. I will see her back for follow-up visit in 2 months for evaluation with repeat blood work. The patient was advised to call immediately if she has any concerning symptoms in the interval. I recommended for the patient to continue her current treatment with GILOTRIF with the same dose. The patient voices understanding of current disease status and treatment options and is in agreement with the current care plan. All questions were answered. The patient knows to call the clinic with any problems, questions or concerns. We can certainly see the patient much sooner if necessary.  Disclaimer: This note was dictated with voice recognition software. Similar sounding words can inadvertently be transcribed and may not be corrected upon review.

## 2018-09-03 NOTE — Telephone Encounter (Signed)
Scheduled appt per 11/12 los - pt aware of appts - per patient no print out needed.

## 2018-09-03 NOTE — Addendum Note (Signed)
Addended by: Ardeen Garland on: 09/03/2018 12:48 PM   Modules accepted: Orders

## 2018-09-09 MED FILL — GILOTRIF 30 MG TABLET: 30 | 30 days supply | Qty: 30 | Fill #1

## 2018-10-07 ENCOUNTER — Encounter: Payer: Self-pay | Admitting: Physician Assistant

## 2018-10-07 ENCOUNTER — Ambulatory Visit (INDEPENDENT_AMBULATORY_CARE_PROVIDER_SITE_OTHER): Payer: Medicare Other | Admitting: Physician Assistant

## 2018-10-07 VITALS — BP 120/76 | HR 71 | Temp 97.5°F | Ht 61.0 in | Wt 160.0 lb

## 2018-10-07 DIAGNOSIS — I10 Essential (primary) hypertension: Secondary | ICD-10-CM

## 2018-10-07 DIAGNOSIS — G4700 Insomnia, unspecified: Secondary | ICD-10-CM

## 2018-10-07 DIAGNOSIS — E2839 Other primary ovarian failure: Secondary | ICD-10-CM

## 2018-10-07 DIAGNOSIS — Z0000 Encounter for general adult medical examination without abnormal findings: Secondary | ICD-10-CM

## 2018-10-07 DIAGNOSIS — C3492 Malignant neoplasm of unspecified part of left bronchus or lung: Secondary | ICD-10-CM

## 2018-10-07 DIAGNOSIS — F419 Anxiety disorder, unspecified: Secondary | ICD-10-CM

## 2018-10-07 DIAGNOSIS — Z0001 Encounter for general adult medical examination with abnormal findings: Secondary | ICD-10-CM

## 2018-10-07 DIAGNOSIS — E039 Hypothyroidism, unspecified: Secondary | ICD-10-CM

## 2018-10-07 DIAGNOSIS — E538 Deficiency of other specified B group vitamins: Secondary | ICD-10-CM

## 2018-10-07 DIAGNOSIS — E782 Mixed hyperlipidemia: Secondary | ICD-10-CM

## 2018-10-07 DIAGNOSIS — I700 Atherosclerosis of aorta: Secondary | ICD-10-CM

## 2018-10-07 DIAGNOSIS — R7309 Other abnormal glucose: Secondary | ICD-10-CM

## 2018-10-07 DIAGNOSIS — Z136 Encounter for screening for cardiovascular disorders: Secondary | ICD-10-CM | POA: Diagnosis not present

## 2018-10-07 DIAGNOSIS — E611 Iron deficiency: Secondary | ICD-10-CM

## 2018-10-07 DIAGNOSIS — E559 Vitamin D deficiency, unspecified: Secondary | ICD-10-CM

## 2018-10-07 DIAGNOSIS — Z79899 Other long term (current) drug therapy: Secondary | ICD-10-CM

## 2018-10-07 NOTE — Patient Instructions (Addendum)
HOW TO SCHEDULE A MAMMOGRAM   [facility] Schedule an appointment by calling [phone].   Recombinant Zoster (Shingles) Vaccine, RZV: What You Need to Know 1. Why get vaccinated? Shingles (also called herpes zoster, or just zoster) is a painful skin rash, often with blisters. Shingles is caused by the varicella zoster virus, the same virus that causes chickenpox. After you have chickenpox, the virus stays in your body and can cause shingles later in life. You can't catch shingles from another person. However, a person who has never had chickenpox (or chickenpox vaccine) could get chickenpox from someone with shingles. A shingles rash usually appears on one side of the face or body and heals within 2 to 4 weeks. Its main symptom is pain, which can be severe. Other symptoms can include fever, headache, chills and upset stomach. Very rarely, a shingles infection can lead to pneumonia, hearing problems, blindness, brain inflammation (encephalitis), or death. For about 1 person in 5, severe pain can continue even long after the rash has cleared up. This long-lasting pain is called post-herpetic neuralgia (PHN). Shingles is far more common in people 9 years of age and older than in younger people, and the risk increases with age. It is also more common in people whose immune system is weakened because of a disease such as cancer, or by drugs such as steroids or chemotherapy. At least 1 million people a year in the [place] get shingles. 2. Shingles vaccine (recombinant) Recombinant shingles vaccine was approved by FDA in 2017 for the prevention of shingles. In clinical trials, it was more than 90% effective in preventing shingles. It can also reduce the likelihood of PHN. Two doses, 2 to 6 months apart, are recommended for adults 76 and older. This vaccine is also recommended for people who have already gotten the live shingles vaccine (Zostavax). There is no live virus in this  vaccine. 3. Some people should not get this vaccine Tell your vaccine provider if you:  Have any severe, life-threatening allergies. A person who has ever had a life-threatening allergic reaction after a dose of recombinant shingles vaccine, or has a severe allergy to any component of this vaccine, may be advised not to be vaccinated. Ask your health care provider if you want information about vaccine components.  Are pregnant or breastfeeding. There is not much information about use of recombinant shingles vaccine in pregnant or nursing women. Your healthcare provider might recommend delaying vaccination.  Are not feeling well. If you have a mild illness, such as a cold, you can probably get the vaccine today. If you are moderately or severely ill, you should probably wait until you recover. Your doctor can advise you.  4. Risks of a vaccine reaction With any medicine, including vaccines, there is a chance of reactions. After recombinant shingles vaccination, a person might experience:  Pain, redness, soreness, or swelling at the site of the injection  Headache, muscle aches, fever, shivering, fatigue  In clinical trials, most people got a sore arm with mild or moderate pain after vaccination, and some also had redness and swelling where they got the shot. Some people felt tired, had muscle pain, a headache, shivering, fever, stomach pain, or nausea. About 1 out of 6 people who got recombinant zoster vaccine experienced side effects that prevented them from doing regular activities. Symptoms went away on their own in about 2 to 3 days. Side effects were more common in younger people. You should still get the second dose of recombinant  zoster vaccine even if you had one of these reactions after the first dose. Other things that could happen after this vaccine:  People sometimes faint after medical procedures, including vaccination. Sitting or lying down for about 15 minutes can help prevent  fainting and injuries caused by a fall. Tell your provider if you feel dizzy or have vision changes or ringing in the ears.  Some people get shoulder pain that can be more severe and longer-lasting than routine soreness that can follow injections. This happens very rarely.  Any medication can cause a severe allergic reaction. Such reactions to a vaccine are estimated at about 1 in a million doses, and would happen within a few minutes to a few hours after the vaccination. As with any medicine, there is a very remote chance of a vaccine causing a serious injury or death. The safety of vaccines is always being monitored. For more information, visit: http://www.aguilar.org/ 5. What if there is a serious problem? What should I look for?  Look for anything that concerns you, such as signs of a severe allergic reaction, very high fever, or unusual behavior. Signs of a severe allergic reaction can include hives, swelling of the face and throat, difficulty breathing, a fast heartbeat, dizziness, and weakness. These would usually start a few minutes to a few hours after the vaccination. What should I do?  If you think it is a severe allergic reaction or other emergency that can't wait, call 9-1-1 and get to the nearest hospital. Otherwise, call your health care provider. Afterward, the reaction should be reported to the Vaccine Adverse Event Reporting System (VAERS). Your doctor should file this report, or you can do it yourself through the VAERS web site atwww.vaers.https://www.bray.com/ by calling 930-507-3664. VAERS does not give medical advice. 6. How can I learn more?  Ask your healthcare provider. He or she can give you the vaccine package insert or suggest other sources of information.  Call your local or state health department.  Contact the Centers for Disease Control and Prevention (CDC): ? Call (612) 045-3083 (1-800-CDC-INFO) or ? Visit the CDC's website at http://hunter.com/ CDC Vaccine  Information Statement (VIS) Recombinant Zoster Vaccine (12/04/2016) This information is not intended to replace advice given to you by your health care provider. Make sure you discuss any questions you have with your health care provider. Document Released: 12/19/2016 Document Revised: 12/19/2016 Document Reviewed: 12/19/2016 Elsevier Interactive Patient Education  2018 [place] mindful eating and here are some tips and tricks below.   Rate your hunger before you eat on a scale of 1-10, try to eat closer to a 6 or higher. And if you are at below that, why are you eating? Slow down and listen to your body.

## 2018-10-07 NOTE — Progress Notes (Signed)
Complete Physical  Assessment and Plan:  Essential hypertension - continue medications, DASH diet, exercise and monitor at home. Call if greater than 130/80.  -     CBC with Differential/Platelet -     BASIC METABOLIC PANEL WITH GFR -     Hepatic function panel -     TSH -     Microalbumin / creatinine urine ratio -     EKG 12-Lead  Aortic atherosclerosis (HCC) Control blood pressure, cholesterol, glucose, increase exercise.  -     Lipid panel  Adenocarcinoma of left lung, stage 4 (HCC) Follow up oncology  Acquired hypothyroidism -     TSH Hypothyroidism-check TSH level, continue medications the same, reminded to take on an empty stomach 30-29mins before food.   Hyperlipidemia -     Lipid panel -continue medications, check lipids, decrease fatty foods, increase activity.   Medication management -     Magnesium  Encounter for antineoplastic chemotherapy Continue follow up  Anxiety continue medications, stress management techniques discussed, increase water, good sleep hygiene discussed, increase exercise, and increase veggies.   Insomnia, unspecified type Takes xanax PRN  Vitamin D deficiency -     VITAMIN D 25 Hydroxy (Vit-D Deficiency, Fractures)  BMI 29.0-29.9,adult - long discussion about weight loss, diet, and exercise -recommended diet heavy in fruits and veggies and low in animal meats, cheeses, and dairy products  Anemia, unspecified type -     Iron,Total/Total Iron Binding Cap -     Vitamin B12   Discussed med's effects and SE's. Screening labs and tests as requested with regular follow-up as recommended. Future Appointments  Date Time Provider [place]  10/07/2018  2:00 PM Vicie Mutters, PA-C [facility] None  11/05/2018 10:45 AM [facility] LAB 2 [facility] None  11/05/2018 11:15 AM Curt Bears, MD [facility] None  10/08/2019  2:00 PM Vicie Mutters, PA-C [facility] None    HPI  72 y.o. female  presents for a complete  physical.  Her blood pressure has been controlled at home, today their BP is BP: 120/76.  She does not workout. She denies chest pain, shortness of breath, dizziness.   Treated recently at urgent care for UTI, taking sulfa.   She has history of stage 4 lung cancer, on Glotinef maintenance therapy and following with Dr. Julien Nordmann. Recent CT scan showed no progression of disease.   She is on cholesterol medication and denies myalgias. Her cholesterol is at goal. The cholesterol last visit was:  Lab Results  Component Value Date   CHOL 182 03/27/2018   HDL 50 (L) 03/27/2018   LDLCALC 112 (H) 03/27/2018   TRIG 97 03/27/2018   CHOLHDL 3.6 03/27/2018  .  Last A1C in the office was:  Lab Results  Component Value Date   HGBA1C 5.4 03/27/2018   Patient is on Vitamin D supplement.   Lab Results  Component Value Date   VD25OH 64 03/27/2018     She is on thyroid medication. Her medication was not changed last visit.   Lab Results  Component Value Date   TSH 2.28 03/27/2018  .  BMI is Body mass index is 30.23 kg/m., she is working on diet and exercise. Wt Readings from Last 3 Encounters:  10/07/18 160 lb (72.6 kg)  09/03/18 157 lb 6.4 oz (71.4 kg)  07/01/18 156 lb 4.8 oz (70.9 kg)    Current Medications:  Current Outpatient Medications on File Prior to Visit  Medication Sig Dispense Refill  . ALPRAZolam (XANAX) 1 MG tablet  TAKE 1/2 TO 1 TABLET BY MOUTH 2-3 TIMES DAILY ONLY IF NEEDED FOR ANXIETY ATTACK. LIMIT TO 5 DAYS PER WEEK TO AVOID ADDICTION 90 tablet 0  . aspirin 81 MG tablet Take 81 mg by mouth at bedtime.     . Cholecalciferol (VITAMIN D3) 10000 UNITS TABS Take 10,000 Units by mouth 3 (three) times a week.     . Cyanocobalamin (VITAMIN B 12 PO) Take 1 tablet by mouth daily.    Marland Kitchen GILOTRIF 30 MG tablet TAKE 1 TABLET (30MG ) BY MOUTH ONCE DAILY. TAKE ON AN EMPTY STOMACH 1 HOUR BEFORE OR 2 HOURS AFTER A MEAL 30 tablet 2  . HYDROcodone-homatropine (HYCODAN) 5-1.5 MG/5ML syrup Take 5  mLs by mouth every 6 (six) hours as needed for cough. 120 mL 0  . ibuprofen (ADVIL,MOTRIN) 200 MG tablet Take 200 mg by mouth every 6 (six) hours as needed.    Marland Kitchen levothyroxine (SYNTHROID, LEVOTHROID) 50 MCG tablet TAKE 1 TABLET BY MOUTH EVERY DAY 90 tablet 0  . Melatonin 10 MG CAPS Take 1 capsule by mouth at bedtime as needed.    . montelukast (SINGULAIR) 10 MG tablet TAKE 1 TABLET BY MOUTH EVERY DAY 90 tablet 3   No current facility-administered medications on file prior to visit.     Health Maintenance:   Immunization History  Administered Date(s) Administered  . Influenza Split 07/16/2018  . Influenza, High Dose Seasonal PF 07/31/2014, 08/06/2017  . Influenza-Unspecified 09/13/2015, 07/23/2016, 08/06/2017  . Pneumococcal Conjugate-13 12/24/2015  . Pneumococcal Polysaccharide-23 07/22/2012  . Td 12/25/2005  . Zoster 01/09/2007   Tetanus: 2007 DUE declines Pneumovax: 2013 Prevnar: 2017 Flu vaccine:2019 Zostavax:2008 discussed new shot  Pap: Hysterectomy MGM:11/20/2016 will get in [place]  DEXA:2018 will get with MGM Colonoscopy: 2014  Patient Care Team: [name], MD as PCP - General (Internal Medicine) Sharol Roussel, [place] as Physician Assistant (Optometry) Jettie Booze, MD as Consulting Physician (Interventional Cardiology) Teena Irani, MD (Inactive) as Consulting Physician (Gastroenterology) Druscilla Brownie, MD as Consulting Physician (Dermatology) Clent Jacks, MD as Consulting Physician (Ophthalmology) Erroll Luna, MD as Consulting Physician (General Surgery)  Medical History:  Past Medical History:  Diagnosis Date  . Adenocarcinoma of left lung, stage 4 ([place]) 04/05/2015   Biopsy confirmed CT SCAN: There are innumerable bilateral pulmonary nodules. These range in size from about 5 mm to 2 cm. They demonstrate irregular indistinct borders, the larger ones demonstrating spiculation. PET SCAN: Diffuse pulmonary metastatic disease with a 4 cm dominant left  lower low lung lesion. No enlarged or hypermetabolic mediastinal or hilar adenopathy.  . Allergy   . Anxiety   . Cancer ([place])   . Change of skin related to chemotherapy 09/25/2017  . Drug-induced skin rash 01/17/2016  . Hyperlipidemia   . Hypertension   . Insomnia   . Prediabetes   . Thyroid disease    Allergies Allergies  Allergen Reactions  . Penicillins Swelling and Rash   SURGICAL HISTORY She  has a past surgical history that includes Tonsillectomy and adenoidectomy and Abdominal hysterectomy (1995). FAMILY HISTORY Her family history includes ALS in her father; Alcohol abuse in her mother and paternal grandfather; Bone cancer in her maternal uncle; Breast cancer in her cousin; Dementia (age of onset: 21) in her paternal aunt; Dementia (age of onset: 50) in her maternal grandmother; Heart disease in her paternal grandfather; Hypertension in her father; Liver disease in her mother; Lung cancer (age of onset: 62) in her paternal grandmother; Lung disease in her maternal grandfather; Melanoma  in her paternal aunt. SOCIAL HISTORY She  reports that she quit smoking about 43 years ago. She has a 5.00 pack-year smoking history. She has never used smokeless tobacco. She reports that she does not drink alcohol or use drugs.  Review of Systems: Review of Systems  Constitutional: Negative for chills, fever and malaise/fatigue.  HENT: Negative for congestion, ear pain and sore throat.   Eyes: Negative.   Respiratory: Positive for shortness of breath. Negative for cough and wheezing.   Cardiovascular: Negative for chest pain, palpitations and leg swelling.  Gastrointestinal: Negative for abdominal pain, blood in stool, constipation, diarrhea, heartburn and melena.  Genitourinary: Negative.   Skin: Negative for itching and rash.  Neurological: Negative for dizziness, sensory change, loss of consciousness and headaches.  Psychiatric/Behavioral: Negative for depression. The patient is not  nervous/anxious and does not have insomnia.     Physical Exam: Estimated body mass index is 30.23 kg/m as calculated from the following:   Height as of this encounter: 5\' 1"  (1.549 m).   Weight as of this encounter: 160 lb (72.6 kg). BP 120/76   Pulse 71   Temp (!) 97.5 F (36.4 C)   Ht 5\' 1"  (1.549 m)   Wt 160 lb (72.6 kg)   SpO2 99%   BMI 30.23 kg/m   General Appearance: Well nourished well developed, in no apparent distress.  Eyes: PERRLA, EOMs, conjunctiva no swelling or erythema ENT/Mouth: Ear canals normal without obstruction, swelling, erythema, or discharge.  TMs normal bilaterally with no erythema, bulging, retraction, or loss of landmark.  Oropharynx moist and clear with no exudate, erythema, or swelling.   Neck: Supple, thyroid normal. No bruits.  No cervical adenopathy Respiratory: Respiratory effort normal, Breath sounds clear A&P without wheeze, rhonchi, rales.   Cardio: RRR without murmurs, rubs or gallops. Brisk peripheral pulses without edema.  Chest: symmetric, with normal excursions Breasts: Symmetric, without lumps, nipple discharge, retractions.  Abdomen: Soft, nontender, no guarding, rebound, hernias, masses, or organomegaly.  Lymphatics: Non tender without lymphadenopathy.  Musculoskeletal: Full ROM all peripheral extremities,5/5 strength, and normal gait.  Skin:  Non-tender, warm and dry Neuro: Awake and oriented X 3, Cranial nerves intact, reflexes equal bilaterally. Normal muscle tone, no cerebellar symptoms. Sensation intact.  Psych:  normal affect, Insight and Judgment appropriate.   EKG: WNL no changes.  Over 40 minutes of exam, counseling, chart review and critical decision making was performed  Vicie Mutters 1:59 PM [facility]

## 2018-10-08 LAB — COMPLETE METABOLIC PANEL WITH GFR
AG Ratio: 1.5 (calc) (ref 1.0–2.5)
ALBUMIN MSPROF: 4 g/dL (ref 3.6–5.1)
ALT: 20 U/L (ref 6–29)
AST: 19 U/L (ref 10–35)
Alkaline phosphatase (APISO): 78 U/L (ref 33–130)
BUN / CREAT RATIO: 18 (calc) (ref 6–22)
BUN: 18 mg/dL (ref 7–25)
CALCIUM: 9 mg/dL (ref 8.6–10.4)
CO2: 26 mmol/L (ref 20–32)
Chloride: 107 mmol/L (ref 98–110)
Creat: 0.99 mg/dL — ABNORMAL HIGH (ref 0.60–0.93)
GFR, Est African American: 66 mL/min/{1.73_m2} (ref >=60)
GFR, Est Non African American: 57 mL/min/{1.73_m2} — ABNORMAL LOW (ref >=60)
Globulin: 2.6 g/dL (calc) (ref 1.9–3.7)
Glucose, Bld: 78 mg/dL (ref 65–99)
Potassium: 4.1 mmol/L (ref 3.5–5.3)
Sodium: 141 mmol/L (ref 135–146)
TOTAL PROTEIN: 6.6 g/dL (ref 6.1–8.1)
Total Bilirubin: 0.6 mg/dL (ref 0.2–1.2)

## 2018-10-08 LAB — CBC WITH DIFFERENTIAL/PLATELET
Absolute Monocytes: 525 cells/uL (ref 200–950)
Basophils Absolute: 51 cells/uL (ref 0–200)
Basophils Relative: 0.8 %
Eosinophils Absolute: 192 cells/uL (ref 15–500)
Eosinophils Relative: 3 %
HCT: 37.6 % (ref 35.0–45.0)
Hemoglobin: 12.3 g/dL (ref 11.7–15.5)
Lymphs Abs: 1344 cells/uL (ref 850–3900)
MCH: 27.8 pg (ref 27.0–33.0)
MCHC: 32.7 g/dL (ref 32.0–36.0)
MCV: 84.9 fL (ref 80.0–100.0)
MPV: 12.4 fL (ref 7.5–12.5)
Monocytes Relative: 8.2 %
NEUTROS ABS: 4288 {cells}/uL (ref 1500–7800)
Neutrophils Relative %: 67 %
Platelets: 222 10*3/uL (ref 140–400)
RBC: 4.43 10*6/uL (ref 3.80–5.10)
RDW: 13 % (ref 11.0–15.0)
Total Lymphocyte: 21 %
WBC: 6.4 10*3/uL (ref 3.8–10.8)

## 2018-10-08 LAB — MICROALBUMIN / CREATININE URINE RATIO
Creatinine, Urine: 62 mg/dL (ref 20–275)
Microalb Creat Ratio: 6 mcg/mg creat (ref <30)
Microalb, Ur: 0.4 mg/dL

## 2018-10-08 LAB — LIPID PANEL
CHOL/HDL RATIO: 3.7 (calc) (ref <5.0)
Cholesterol: 187 mg/dL (ref <200)
HDL: 50 mg/dL — ABNORMAL LOW (ref >50)
LDL Cholesterol (Calc): 115 mg/dL (calc) — ABNORMAL HIGH
Non-HDL Cholesterol (Calc): 137 mg/dL (calc) — ABNORMAL HIGH (ref <130)
Triglycerides: 113 mg/dL (ref <150)

## 2018-10-08 LAB — TSH: TSH: 4.02 mIU/L (ref 0.40–4.50)

## 2018-10-08 LAB — VITAMIN D 25 HYDROXY (VIT D DEFICIENCY, FRACTURES): Vit D, 25-Hydroxy: 100 ng/mL (ref 30–100)

## 2018-10-08 LAB — IRON, TOTAL/TOTAL IRON BINDING CAP
%SAT: 19 % (ref 16–45)
Iron: 59 ug/dL (ref 45–160)
TIBC: 304 mcg/dL (calc) (ref 250–450)

## 2018-10-08 LAB — VITAMIN B12: Vitamin B-12: 494 pg/mL (ref 200–1100)

## 2018-10-08 LAB — MAGNESIUM: Magnesium: 1.8 mg/dL (ref 1.5–2.5)

## 2018-10-11 MED FILL — GILOTRIF 30 MG TABLET: 30 | 30 days supply | Qty: 30 | Fill #2

## 2018-10-12 ENCOUNTER — Other Ambulatory Visit: Payer: Self-pay | Admitting: Physician Assistant

## 2018-10-22 ENCOUNTER — Other Ambulatory Visit: Payer: Self-pay | Admitting: Internal Medicine

## 2018-10-22 DIAGNOSIS — E2839 Other primary ovarian failure: Secondary | ICD-10-CM

## 2018-10-30 ENCOUNTER — Other Ambulatory Visit: Payer: Self-pay | Admitting: Physician Assistant

## 2018-10-31 LAB — HM DEXA SCAN

## 2018-10-31 LAB — HM MAMMOGRAPHY

## 2018-11-01 ENCOUNTER — Other Ambulatory Visit: Payer: Self-pay | Admitting: Internal Medicine

## 2018-11-04 ENCOUNTER — Inpatient Hospital Stay: Payer: Medicare Other

## 2018-11-04 ENCOUNTER — Other Ambulatory Visit: Payer: Self-pay | Admitting: Emergency Medicine

## 2018-11-04 ENCOUNTER — Telehealth: Payer: Self-pay | Admitting: *Deleted

## 2018-11-04 ENCOUNTER — Inpatient Hospital Stay: Payer: Medicare Other | Attending: Internal Medicine | Admitting: Medical

## 2018-11-04 ENCOUNTER — Telehealth: Payer: Self-pay

## 2018-11-04 VITALS — BP 118/80 | HR 102 | Temp 98.1°F | Resp 18 | Ht 61.0 in | Wt 156.3 lb

## 2018-11-04 DIAGNOSIS — K449 Diaphragmatic hernia without obstruction or gangrene: Secondary | ICD-10-CM

## 2018-11-04 DIAGNOSIS — R5383 Other fatigue: Secondary | ICD-10-CM

## 2018-11-04 DIAGNOSIS — Z791 Long term (current) use of non-steroidal anti-inflammatories (NSAID): Secondary | ICD-10-CM | POA: Insufficient documentation

## 2018-11-04 DIAGNOSIS — R197 Diarrhea, unspecified: Secondary | ICD-10-CM | POA: Diagnosis not present

## 2018-11-04 DIAGNOSIS — R110 Nausea: Secondary | ICD-10-CM | POA: Diagnosis not present

## 2018-11-04 DIAGNOSIS — Z7982 Long term (current) use of aspirin: Secondary | ICD-10-CM | POA: Insufficient documentation

## 2018-11-04 DIAGNOSIS — C7801 Secondary malignant neoplasm of right lung: Secondary | ICD-10-CM | POA: Insufficient documentation

## 2018-11-04 DIAGNOSIS — Z79899 Other long term (current) drug therapy: Secondary | ICD-10-CM | POA: Diagnosis not present

## 2018-11-04 DIAGNOSIS — Z87891 Personal history of nicotine dependence: Secondary | ICD-10-CM

## 2018-11-04 DIAGNOSIS — C3432 Malignant neoplasm of lower lobe, left bronchus or lung: Secondary | ICD-10-CM | POA: Diagnosis present

## 2018-11-04 DIAGNOSIS — R6883 Chills (without fever): Secondary | ICD-10-CM | POA: Diagnosis not present

## 2018-11-04 DIAGNOSIS — C7802 Secondary malignant neoplasm of left lung: Secondary | ICD-10-CM | POA: Insufficient documentation

## 2018-11-04 DIAGNOSIS — R630 Anorexia: Secondary | ICD-10-CM

## 2018-11-04 DIAGNOSIS — F419 Anxiety disorder, unspecified: Secondary | ICD-10-CM | POA: Insufficient documentation

## 2018-11-04 DIAGNOSIS — C3492 Malignant neoplasm of unspecified part of left bronchus or lung: Secondary | ICD-10-CM

## 2018-11-04 LAB — CBC WITH DIFFERENTIAL (CANCER CENTER ONLY)
Abs Immature Granulocytes: 0.03 10*3/uL (ref 0.00–0.07)
Basophils Absolute: 0 10*3/uL (ref 0.0–0.1)
Basophils Relative: 0 %
Eosinophils Absolute: 0 10*3/uL (ref 0.0–0.5)
Eosinophils Relative: 0 %
HCT: 40.5 % (ref 36.0–46.0)
Hemoglobin: 13.2 g/dL (ref 12.0–15.0)
Immature Granulocytes: 0 %
Lymphocytes Relative: 12 %
Lymphs Abs: 1.2 10*3/uL (ref 0.7–4.0)
MCH: 27.5 pg (ref 26.0–34.0)
MCHC: 32.6 g/dL (ref 30.0–36.0)
MCV: 84.4 fL (ref 80.0–100.0)
Monocytes Absolute: 0.9 10*3/uL (ref 0.1–1.0)
Monocytes Relative: 9 %
Neutro Abs: 7.6 10*3/uL (ref 1.7–7.7)
Neutrophils Relative %: 79 %
PLATELETS: 204 10*3/uL (ref 150–400)
RBC: 4.8 MIL/uL (ref 3.87–5.11)
RDW: 13.2 % (ref 11.5–15.5)
WBC Count: 9.7 10*3/uL (ref 4.0–10.5)
nRBC: 0 % (ref 0.0–0.2)

## 2018-11-04 LAB — CMP (CANCER CENTER ONLY)
ALT: 15 U/L (ref 0–44)
AST: 17 U/L (ref 15–41)
Albumin: 3.6 g/dL (ref 3.5–5.0)
Alkaline Phosphatase: 72 U/L (ref 38–126)
Anion gap: 9 (ref 5–15)
BUN: 12 mg/dL (ref 8–23)
CO2: 20 mmol/L — ABNORMAL LOW (ref 22–32)
CREATININE: 1.05 mg/dL — AB (ref 0.44–1.00)
Calcium: 8.9 mg/dL (ref 8.9–10.3)
Chloride: 108 mmol/L (ref 98–111)
GFR, Est AFR Am: >60 mL/min (ref >60)
GFR, Estimated: 53 mL/min — ABNORMAL LOW (ref >60)
Glucose, Bld: 107 mg/dL — ABNORMAL HIGH (ref 70–99)
Potassium: 4.5 mmol/L (ref 3.5–5.1)
Sodium: 137 mmol/L (ref 135–145)
Total Bilirubin: 1.1 mg/dL (ref 0.3–1.2)
Total Protein: 7 g/dL (ref 6.5–8.1)

## 2018-11-04 LAB — MAGNESIUM: Magnesium: 1.6 mg/dL — ABNORMAL LOW (ref 1.7–2.4)

## 2018-11-04 MED ORDER — DIPHENOXYLATE-ATROPINE 2.5-0.025 MG PO TABS: ORAL_TABLET | ORAL | 2 refills | Status: DC

## 2018-11-04 NOTE — Progress Notes (Signed)
Pt given specimen kit for diarrhea by PA Lucianne Lei with collection instructions, verbalized understanding.

## 2018-11-04 NOTE — Patient Instructions (Signed)

## 2018-11-04 NOTE — Telephone Encounter (Signed)
Printed avs and calender of upcoming appointment. Per 1/13 los. Tried contacting the RN for [employer] or [facility] to see if the appointment with [facility] was to be canceled for 1/14.

## 2018-11-04 NOTE — Telephone Encounter (Signed)
"  Acute diarrhea.  On Gilotrif.  Increased from one to two stools per day to going all the time.   Don't know how often bowels are moving.  Bile, dark green liquid comes out even if I just go to pee.  Anti-diarrheal not working.  Very fatigued staying in bed sleep most of the time.  Thursday night cold chills but no fever. Drinking sips of water; water is all I have.  Not able to eat eggs and toast this morning.  Baked potato was tolerable yesterday.  What do I do now?"  Verbal order received and read back from Dr. Julien Nordmann for patient to be seen in [facility] if appointment availability.  [facility] will work in now.  Verbal order from Sandi Mealy PA for cbc-diff, CMET and Mg.    Patient notified and advised to come in now.  "Calling spouse to bring me in and getting dressed."

## 2018-11-05 ENCOUNTER — Telehealth: Payer: Self-pay

## 2018-11-05 ENCOUNTER — Encounter: Payer: Self-pay | Admitting: *Deleted

## 2018-11-05 ENCOUNTER — Inpatient Hospital Stay: Payer: Medicare Other | Admitting: Internal Medicine

## 2018-11-05 ENCOUNTER — Inpatient Hospital Stay: Payer: Medicare Other

## 2018-11-05 NOTE — Telephone Encounter (Signed)
Verified with patient that appointment was canceled for 1/14 per 1/13 los to return in 2 weeks to see [facility]  by Lucianne Lei

## 2018-11-05 NOTE — Progress Notes (Signed)
PA for Lomotil has been submitted.

## 2018-11-06 MED FILL — GILOTRIF 30 MG TABLET: 30 | 30 days supply | Qty: 30 | Fill #0

## 2018-11-07 NOTE — Progress Notes (Signed)
[facility] Progress Note   Elizabeth [name] [id number] 04-Nov-1945 73 y.o.  Elizabeth [name] is managed by Dr. Julien Nordmann  Actively treated with chemotherapy/immunotherapy/hormonal therapy: yes  Current Therapy: Gilotrif   Assessment: Plan:    Adenocarcinoma of left lung, stage 4 (HCC)  Diarrhea, unspecified type - Plan: diphenoxylate-atropine (LOMOTIL) 2.5-0.025 MG tablet   Stage IV adenocarcinoma of the left lung: The patient continues to be managed by Dr. Julien Nordmann and is treated with Gilotrif.  She will return to clinic on 11/21/2018 for a visit with Dr. Julien Nordmann and for labs.  Diarrhea: The patient was given a prescription for Lomotil with instructions to take 1-2 4 times daily as needed for diarrhea.  She was given a collection device to collect a stool specimen for C. difficile should her diarrhea continue.  Please see After Visit Summary for patient specific instructions.  Future Appointments  Date Time Provider [place]  11/21/2018 11:30 AM [facility] ONLY [facility] None  11/21/2018 12:00 PM Curt Bears, MD [facility] None  04/08/2019 11:15 AM Vicie Mutters, PA-C [facility] None  10/08/2019 11:00 AM [name], MD [facility] None    No orders of the defined types were placed in this encounter.      Subjective:   Patient ID:  Elizabeth [name] is a 73 y.o. (DOB Nov 10, 1945) female.  Chief Complaint:  Chief Complaint  Patient presents with  . Diarrhea    HPI Elizabeth [name] is a 73 year old female with a history of a stage IV adenocarcinoma of the left lung.  She is managed by Dr. Julien Nordmann and is treated with Gilotrif.  She presents to the clinic today with a report of diarrhea over the past 1-1/2 weeks.  Her diarrhea has been watery and non-foul-smelling.  She is tried Imodium without relief.  She reports having episodic diarrhea since beginning Gilotrif.  This is the worst case she has had thus far.  She denies any recent  antibiotic usage.  She has not been around anyone else that has been ill.  She has not eaten anywhere new.  She reports some nausea, anorexia, fatigue, and chills.  She reports her hiatal hernia has been bothering her.  She denies vomiting, fevers, chest pain, or shortness of breath.  She is scheduled to see Dr. Julien Nordmann in follow-up on 11/21/2018.  Medications: I have reviewed the patient's current medications.  Allergies:  Allergies  Allergen Reactions  . Penicillins Swelling and Rash    Past Medical History:  Diagnosis Date  . Adenocarcinoma of left lung, stage 4 ([place]) 04/05/2015   Biopsy confirmed CT SCAN: There are innumerable bilateral pulmonary nodules. These range in size from about 5 mm to 2 cm. They demonstrate irregular indistinct borders, the larger ones demonstrating spiculation. PET SCAN: Diffuse pulmonary metastatic disease with a 4 cm dominant left lower low lung lesion. No enlarged or hypermetabolic mediastinal or hilar adenopathy.  . Allergy   . Anxiety   . Cancer ([place])   . Change of skin related to chemotherapy 09/25/2017  . Drug-induced skin rash 01/17/2016  . Hyperlipidemia   . Hypertension   . Insomnia   . Prediabetes   . Thyroid disease     Past Surgical History:  Procedure Laterality Date  . ABDOMINAL HYSTERECTOMY  1995   w BSO  . TONSILLECTOMY AND ADENOIDECTOMY      Family History  Problem Relation Age of Onset  . Liver disease Mother   . Alcohol abuse Mother   . ALS  Father   . Hypertension Father   . Dementia Maternal Grandmother 69  . Lung disease Maternal Grandfather   . Lung cancer Paternal Grandmother 54  . Alcohol abuse Paternal Grandfather   . Heart disease Paternal Grandfather   . Melanoma Paternal Aunt   . Bone cancer Maternal Uncle   . Dementia Paternal Aunt 54  . Breast cancer Cousin     Social History   Socioeconomic History  . Marital status: Married    Spouse name: Not on file  . Number of children: Not on file  . Years of  education: Not on file  . Highest education level: Not on file  Occupational History  . Not on file  Social Needs  . Financial resource strain: Not on file  . Food insecurity:    Worry: Not on file    Inability: Not on file  . Transportation needs:    Medical: Not on file    Non-medical: Not on file  Tobacco Use  . Smoking status: Former Smoker    Packs/day: 0.50    Years: 10.00    Pack years: 5.00    Last attempt to quit: 10/23/1974    Years since quitting: 44.0  . Smokeless tobacco: Never Used  Substance and Sexual Activity  . Alcohol use: No    Comment: Rare  . Drug use: No  . Sexual activity: Not on file  Lifestyle  . Physical activity:    Days per week: Not on file    Minutes per session: Not on file  . Stress: Not on file  Relationships  . Social connections:    Talks on phone: Not on file    Gets together: Not on file    Attends religious service: Not on file    Active member of club or organization: Not on file    Attends meetings of clubs or organizations: Not on file    Relationship status: Not on file  . Intimate partner violence:    Fear of current or ex partner: Not on file    Emotionally abused: Not on file    Physically abused: Not on file    Forced sexual activity: Not on file  Other Topics Concern  . Not on file  Social History Narrative  . Not on file    Past Medical History, Surgical history, Social history, and Family history were reviewed and updated as appropriate.   Please see review of systems for further details on the patient's review from today.   Review of Systems:  Review of Systems  Constitutional: Positive for chills and fatigue. Negative for appetite change, diaphoresis and fever.  Respiratory: Negative for cough, chest tightness and shortness of breath.   Cardiovascular: Negative for chest pain, palpitations and leg swelling.  Gastrointestinal: Positive for diarrhea and nausea. Negative for abdominal distention, abdominal pain,  blood in stool, constipation and vomiting.  Genitourinary: Negative for decreased urine volume and difficulty urinating.  Neurological: Negative for weakness.    Objective:   Physical Exam:  BP 118/80 (BP Location: Left Arm, Patient Position: Sitting)   Pulse (!) 102 Comment: nurse aware of pulse  Temp 98.1 F (36.7 C) (Oral)   Resp 18   Ht 5\' 1"  (1.549 m)   Wt 156 lb 4.8 oz (70.9 kg)   SpO2 98%   BMI 29.53 kg/m  ECOG: 0  Physical Exam Constitutional:      General: She is not in acute distress.    Appearance: She is not  diaphoretic.  HENT:     Head: Normocephalic and atraumatic.  Cardiovascular:     Rate and Rhythm: Normal rate and regular rhythm.     Heart sounds: Normal heart sounds. No murmur. No friction rub. No gallop.   Pulmonary:     Effort: Pulmonary effort is normal. No respiratory distress.     Breath sounds: Normal breath sounds. No stridor. No wheezing or rales.  Abdominal:     General: Bowel sounds are normal. There is no distension.     Palpations: Abdomen is soft. There is no mass.     Tenderness: There is no abdominal tenderness. There is no guarding or rebound.  Skin:    General: Skin is warm and dry.  Neurological:     Mental Status: She is alert.     Coordination: Coordination normal.     Lab Review:     Component Value Date/Time   NA 137 11/04/2018 1241   NA 139 10/26/2017 1252   K 4.5 11/04/2018 1241   K 4.1 10/26/2017 1252   CL 108 11/04/2018 1241   CO2 20 (L) 11/04/2018 1241   CO2 28 10/26/2017 1252   GLUCOSE 107 (H) 11/04/2018 1241   GLUCOSE 97 10/26/2017 1252   BUN 12 11/04/2018 1241   BUN 15.7 10/26/2017 1252   CREATININE 1.05 (H) 11/04/2018 1241   CREATININE 0.99 (H) 10/07/2018 1446   CREATININE 1.0 10/26/2017 1252   CALCIUM 8.9 11/04/2018 1241   CALCIUM 8.8 10/26/2017 1252   PROT 7.0 11/04/2018 1241   PROT 6.5 10/26/2017 1252   ALBUMIN 3.6 11/04/2018 1241   ALBUMIN 3.4 (L) 10/26/2017 1252   AST 17 11/04/2018 1241   AST 19  10/26/2017 1252   ALT 15 11/04/2018 1241   ALT 22 10/26/2017 1252   ALKPHOS 72 11/04/2018 1241   ALKPHOS 70 10/26/2017 1252   BILITOT 1.1 11/04/2018 1241   BILITOT 0.90 10/26/2017 1252   GFRNONAA 53 (L) 11/04/2018 1241   GFRNONAA 57 (L) 10/07/2018 1446   GFRAA >60 11/04/2018 1241   GFRAA 66 10/07/2018 1446       Component Value Date/Time   WBC 9.7 11/04/2018 1241   WBC 6.4 10/07/2018 1446   RBC 4.80 11/04/2018 1241   HGB 13.2 11/04/2018 1241   HGB 12.2 10/26/2017 1252   HCT 40.5 11/04/2018 1241   HCT 37.2 10/26/2017 1252   PLT 204 11/04/2018 1241   PLT 173 10/26/2017 1252   MCV 84.4 11/04/2018 1241   MCV 85.9 10/26/2017 1252   MCH 27.5 11/04/2018 1241   MCHC 32.6 11/04/2018 1241   RDW 13.2 11/04/2018 1241   RDW 13.0 10/26/2017 1252   LYMPHSABS 1.2 11/04/2018 1241   LYMPHSABS 1.2 10/26/2017 1252   MONOABS 0.9 11/04/2018 1241   MONOABS 0.4 10/26/2017 1252   EOSABS 0.0 11/04/2018 1241   EOSABS 0.1 10/26/2017 1252   BASOSABS 0.0 11/04/2018 1241   BASOSABS 0.0 10/26/2017 1252   -------------------------------  Imaging from last 24 hours (if applicable):  Radiology interpretation: No results found.

## 2018-11-08 ENCOUNTER — Ambulatory Visit: Payer: Self-pay | Admitting: Internal Medicine

## 2018-11-08 ENCOUNTER — Encounter: Payer: Self-pay | Admitting: Internal Medicine

## 2018-11-08 NOTE — Progress Notes (Deleted)
   Subjective:    Patient ID: Elizabeth [name], female    DOB: Apr 07, 1946, 73 y.o.   MRN: [id number]  HPI     This very nice 73 yo MWF with hx/o Lung [place] ca on Gliotrif per Dr Earlie Server presents with a 2-3 week hx/o frequent watery diarrheal stools.       Outpatient Medications Prior to Visit  Medication Sig Dispense Refill  . ALPRAZolam (XANAX) 1 MG tablet Take 1/2-1 tablet 2 - 3 x /day ONLY if needed for Anxiety Attack &  limit to 5 days /week to avoid addiction 90 tablet 0  . aspirin 81 MG tablet Take 81 mg by mouth at bedtime.     . Cholecalciferol (VITAMIN D3) 10000 UNITS TABS Take 10,000 Units by mouth 3 (three) times a week.     . Cyanocobalamin (VITAMIN B 12 PO) Take 1 tablet by mouth daily.    . diphenoxylate-atropine (LOMOTIL) 2.5-0.025 MG tablet 1 to 2 PO QID prn diarrhea 40 tablet 2  . GILOTRIF 30 MG tablet TAKE 1 TABLET (30MG ) BY MOUTH ONCE DAILY. TAKE ON AN EMPTY STOMACH 1 HOUR BEFORE OR 2 HOURS AFTER A MEAL 30 tablet 2  . HYDROcodone-homatropine (HYCODAN) 5-1.5 MG/5ML syrup Take 5 mLs by mouth every 6 (six) hours as needed for cough. 120 mL 0  . ibuprofen (ADVIL,MOTRIN) 200 MG tablet Take 200 mg by mouth every 6 (six) hours as needed.    Marland Kitchen levothyroxine (SYNTHROID, LEVOTHROID) 50 MCG tablet TAKE 1 TABLET BY MOUTH EVERY DAY 90 tablet 1  . Melatonin 10 MG CAPS Take 1 capsule by mouth at bedtime as needed.    . montelukast (SINGULAIR) 10 MG tablet TAKE 1 TABLET BY MOUTH EVERY DAY 90 tablet 3  . mupirocin ointment (BACTROBAN) 2 % PLACE 1 APPLICATION INTO THE NOSE 2 TIMES DAILY. 22 g 0   No facility-administered medications prior to visit.    Allergies  Allergen Reactions  . Penicillins Swelling and Rash   Past Medical History:  Diagnosis Date  . Adenocarcinoma of left lung, stage 4 ([place]) 04/05/2015   Biopsy confirmed CT SCAN: There are innumerable bilateral pulmonary nodules. These range in size from about 5 mm to 2 cm. They demonstrate irregular indistinct borders, the  larger ones demonstrating spiculation. PET SCAN: Diffuse pulmonary metastatic disease with a 4 cm dominant left lower low lung lesion. No enlarged or hypermetabolic mediastinal or hilar adenopathy.  . Allergy   . Anxiety   . Cancer ([place])   . Change of skin related to chemotherapy 09/25/2017  . Drug-induced skin rash 01/17/2016  . Hyperlipidemia   . Hypertension   . Insomnia   . Prediabetes   . Thyroid disease    Past Surgical History:  Procedure Laterality Date  . ABDOMINAL HYSTERECTOMY  1995   w BSO  . TONSILLECTOMY AND ADENOIDECTOMY      Review of Systems     Objective:   Physical Exam  There were no vitals taken for this visit.  HEENT - WNL. Neck - supple.  Chest - Clear equal BS. Cor - Nl HS. RRR w/o sig MGR. PP 1(+). No edema. MS- FROM w/o deformities.  Gait Nl. Neuro -  Nl w/o focal abnormalities.     Assessment & Plan:

## 2018-11-21 ENCOUNTER — Telehealth: Payer: Self-pay

## 2018-11-21 ENCOUNTER — Encounter: Payer: Self-pay | Admitting: Internal Medicine

## 2018-11-21 ENCOUNTER — Inpatient Hospital Stay: Payer: Medicare Other

## 2018-11-21 ENCOUNTER — Inpatient Hospital Stay (HOSPITAL_BASED_OUTPATIENT_CLINIC_OR_DEPARTMENT_OTHER): Payer: Medicare Other | Admitting: Internal Medicine

## 2018-11-21 VITALS — BP 127/71 | HR 74 | Temp 98.0°F | Resp 18 | Ht 61.0 in | Wt 159.1 lb

## 2018-11-21 DIAGNOSIS — R197 Diarrhea, unspecified: Secondary | ICD-10-CM | POA: Diagnosis not present

## 2018-11-21 DIAGNOSIS — C3492 Malignant neoplasm of unspecified part of left bronchus or lung: Secondary | ICD-10-CM

## 2018-11-21 DIAGNOSIS — I10 Essential (primary) hypertension: Secondary | ICD-10-CM

## 2018-11-21 DIAGNOSIS — F419 Anxiety disorder, unspecified: Secondary | ICD-10-CM

## 2018-11-21 DIAGNOSIS — C3432 Malignant neoplasm of lower lobe, left bronchus or lung: Secondary | ICD-10-CM

## 2018-11-21 DIAGNOSIS — C7801 Secondary malignant neoplasm of right lung: Secondary | ICD-10-CM | POA: Diagnosis not present

## 2018-11-21 DIAGNOSIS — C7802 Secondary malignant neoplasm of left lung: Secondary | ICD-10-CM | POA: Diagnosis not present

## 2018-11-21 DIAGNOSIS — Z7982 Long term (current) use of aspirin: Secondary | ICD-10-CM

## 2018-11-21 DIAGNOSIS — Z87891 Personal history of nicotine dependence: Secondary | ICD-10-CM

## 2018-11-21 DIAGNOSIS — Z791 Long term (current) use of non-steroidal anti-inflammatories (NSAID): Secondary | ICD-10-CM

## 2018-11-21 DIAGNOSIS — Z5111 Encounter for antineoplastic chemotherapy: Secondary | ICD-10-CM

## 2018-11-21 DIAGNOSIS — Z79899 Other long term (current) drug therapy: Secondary | ICD-10-CM

## 2018-11-21 LAB — CMP (CANCER CENTER ONLY)
ALBUMIN: 3.4 g/dL — AB (ref 3.5–5.0)
ALT: 17 U/L (ref 0–44)
AST: 20 U/L (ref 15–41)
Alkaline Phosphatase: 62 U/L (ref 38–126)
Anion gap: 8 (ref 5–15)
BUN: 14 mg/dL (ref 8–23)
CO2: 29 mmol/L (ref 22–32)
Calcium: 8.8 mg/dL — ABNORMAL LOW (ref 8.9–10.3)
Chloride: 105 mmol/L (ref 98–111)
Creatinine: 1.09 mg/dL — ABNORMAL HIGH (ref 0.44–1.00)
GFR, Est AFR Am: 59 mL/min — ABNORMAL LOW (ref >60)
GFR, Estimated: 51 mL/min — ABNORMAL LOW (ref >60)
Glucose, Bld: 78 mg/dL (ref 70–99)
Potassium: 4.4 mmol/L (ref 3.5–5.1)
Sodium: 142 mmol/L (ref 135–145)
Total Bilirubin: 0.9 mg/dL (ref 0.3–1.2)
Total Protein: 6.5 g/dL (ref 6.5–8.1)

## 2018-11-21 LAB — CBC WITH DIFFERENTIAL (CANCER CENTER ONLY)
ABS IMMATURE GRANULOCYTES: 0.02 10*3/uL (ref 0.00–0.07)
BASOS PCT: 1 %
Basophils Absolute: 0 10*3/uL (ref 0.0–0.1)
Eosinophils Absolute: 0.1 10*3/uL (ref 0.0–0.5)
Eosinophils Relative: 2 %
HCT: 39.3 % (ref 36.0–46.0)
Hemoglobin: 12.5 g/dL (ref 12.0–15.0)
Immature Granulocytes: 0 %
Lymphocytes Relative: 18 %
Lymphs Abs: 1.1 10*3/uL (ref 0.7–4.0)
MCH: 27.4 pg (ref 26.0–34.0)
MCHC: 31.8 g/dL (ref 30.0–36.0)
MCV: 86.2 fL (ref 80.0–100.0)
Monocytes Absolute: 0.5 10*3/uL (ref 0.1–1.0)
Monocytes Relative: 9 %
Neutro Abs: 4.3 10*3/uL (ref 1.7–7.7)
Neutrophils Relative %: 70 %
Platelet Count: 197 10*3/uL (ref 150–400)
RBC: 4.56 MIL/uL (ref 3.87–5.11)
RDW: 13.3 % (ref 11.5–15.5)
WBC Count: 6.2 10*3/uL (ref 4.0–10.5)
nRBC: 0 % (ref 0.0–0.2)

## 2018-11-21 NOTE — Telephone Encounter (Signed)
Patient declined avs and calender due to MyChart. Per 1/30 los

## 2018-11-21 NOTE — Progress Notes (Signed)
[facility] [phone]   [phone]  OFFICE PROGRESS NOTE  [name], MD 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  CURRENT THERAPY: Gilotrif 30 mg by mouth daily started 02/21/2016 status post 30 months of treatment.  INTERVAL HISTORY: Elizabeth [name] 73 y.o. female returns to the clinic today for follow-up visit.  The patient is feeling better today.  She had an episode of severe diarrhea for 1 or 2 days after using topical Bactroban for her nasal sores.  She was seen at the symptom management clinic and started on treatment with Lomotil in addition to Imodium.  She discontinued Bactroban and she felt much better after that.  She denied having any current nausea, vomiting, diarrhea or constipation.  She denied having any chest pain, shortness of breath, cough or hemoptysis.  She has no recent weight loss or night sweats.  The patient is here today for evaluation and repeat blood work.  MEDICAL HISTORY: Past Medical History:  Diagnosis Date  . Adenocarcinoma of left lung, stage 4 ([place]) 04/05/2015   Biopsy confirmed CT SCAN: There are innumerable bilateral pulmonary nodules. These range in size from about 5 mm to 2 cm. They demonstrate irregular indistinct borders, the larger ones demonstrating spiculation. PET SCAN: Diffuse pulmonary metastatic disease with a 4 cm dominant left lower low lung lesion. No enlarged or hypermetabolic mediastinal or hilar adenopathy.  . Allergy   . Anxiety   . Cancer ([place])   . Change of skin  related to chemotherapy 09/25/2017  . Drug-induced skin rash 01/17/2016  . Hyperlipidemia   . Hypertension   . Insomnia   . Prediabetes   . Thyroid disease     ALLERGIES:  is allergic to penicillins.  MEDICATIONS:  Current Outpatient Medications  Medication Sig Dispense Refill  . ALPRAZolam (XANAX) 1 MG tablet Take 1/2-1 tablet 2 - 3 x /day ONLY if needed for Anxiety Attack &  limit to 5 days /week to avoid addiction 90 tablet 0  . aspirin 81 MG tablet Take 81 mg by mouth at bedtime.     . Cholecalciferol (VITAMIN D3) 10000 UNITS TABS Take 10,000 Units by mouth 3 (three) times a week.     . Cyanocobalamin (VITAMIN B 12 PO) Take 1 tablet by mouth daily.    . diphenoxylate-atropine (LOMOTIL) 2.5-0.025 MG tablet 1 to 2 PO QID prn diarrhea 40 tablet 2  . GILOTRIF 30 MG tablet TAKE 1 TABLET ('30MG'$ ) BY MOUTH ONCE DAILY. TAKE ON AN EMPTY STOMACH 1 HOUR BEFORE OR 2 HOURS AFTER A MEAL 30 tablet 2  . HYDROcodone-homatropine (HYCODAN) 5-1.5 MG/5ML syrup Take 5 mLs by mouth every 6 (six) hours as needed for cough. 120 mL 0  . ibuprofen (ADVIL,MOTRIN) 200 MG tablet Take 200 mg by mouth every 6 (six) hours as needed.    Marland Kitchen levothyroxine (SYNTHROID, LEVOTHROID) 50 MCG tablet TAKE 1 TABLET BY MOUTH EVERY DAY 90 tablet 1  . Melatonin 10 MG CAPS Take 1 capsule by mouth at bedtime as needed.    . montelukast (SINGULAIR) 10 MG tablet  TAKE 1 TABLET BY MOUTH EVERY DAY 90 tablet 3  . mupirocin ointment (BACTROBAN) 2 % PLACE 1 APPLICATION INTO THE NOSE 2 TIMES DAILY. 22 g 0   No current facility-administered medications for this visit.     SURGICAL HISTORY:  Past Surgical History:  Procedure Laterality Date  . ABDOMINAL HYSTERECTOMY  1995   w BSO  . TONSILLECTOMY AND ADENOIDECTOMY      REVIEW OF SYSTEMS:  A comprehensive review of systems was negative.   PHYSICAL EXAMINATION: General appearance: alert, cooperative and no distress Head: Normocephalic, without obvious abnormality, atraumatic Neck: no  adenopathy, no JVD, supple, symmetrical, trachea midline and thyroid not enlarged, symmetric, no tenderness/mass/nodules Lymph nodes: Cervical, supraclavicular, and axillary nodes normal. Resp: clear to auscultation bilaterally Back: symmetric, no curvature. ROM normal. No CVA tenderness. Cardio: regular rate and rhythm, S1, S2 normal, no murmur, click, rub or gallop GI: soft, non-tender; bowel sounds normal; no masses,  no organomegaly Extremities: extremities normal, atraumatic, no cyanosis or edema  ECOG PERFORMANCE STATUS: 0 - Asymptomatic  Blood pressure 127/71, pulse 74, temperature 98 F (36.7 C), temperature source Oral, resp. rate 18, height '5\' 1"'$  (1.549 m), weight 159 lb 1.6 oz (72.2 kg), SpO2 98 %.  LABORATORY DATA: Lab Results  Component Value Date   WBC 6.2 11/21/2018   HGB 12.5 11/21/2018   HCT 39.3 11/21/2018   MCV 86.2 11/21/2018   PLT 197 11/21/2018      Chemistry      Component Value Date/Time   NA 142 11/21/2018 1121   NA 139 10/26/2017 1252   K 4.4 11/21/2018 1121   K 4.1 10/26/2017 1252   CL 105 11/21/2018 1121   CO2 29 11/21/2018 1121   CO2 28 10/26/2017 1252   BUN 14 11/21/2018 1121   BUN 15.7 10/26/2017 1252   CREATININE 1.09 (H) 11/21/2018 1121   CREATININE 0.99 (H) 10/07/2018 1446   CREATININE 1.0 10/26/2017 1252      Component Value Date/Time   CALCIUM 8.8 (L) 11/21/2018 1121   CALCIUM 8.8 10/26/2017 1252   ALKPHOS 62 11/21/2018 1121   ALKPHOS 70 10/26/2017 1252   AST 20 11/21/2018 1121   AST 19 10/26/2017 1252   ALT 17 11/21/2018 1121   ALT 22 10/26/2017 1252   BILITOT 0.9 11/21/2018 1121   BILITOT 0.90 10/26/2017 1252       RADIOGRAPHIC STUDIES: No results found.  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  The patient is feeling much better.  She is tolerating her treatment with Gilotrif fairly well. I recommended for her to continue her current treatment with the same dose. I will see her back for follow-up visit in 2 months for reevaluation with repeat blood work. She was advised to call immediately if she has any concerning symptoms in the interval. The patient voices understanding of current disease status and treatment options and is in agreement with the current care plan. All questions were answered. The patient knows to call the clinic with any problems, questions or concerns. We can certainly see the patient much sooner if necessary.  Disclaimer: This note was dictated with voice recognition software. Similar sounding words can inadvertently be transcribed and  may not be corrected upon review.

## 2018-12-05 MED FILL — GILOTRIF 30 MG TABLET: 30 | 30 days supply | Qty: 30 | Fill #1

## 2018-12-18 ENCOUNTER — Telehealth: Payer: Self-pay | Admitting: Internal Medicine

## 2018-12-18 NOTE — Telephone Encounter (Signed)
Called patient vm scheduling log.  Patient needed an appt before 04/06 and an afternoon appt if possible because she will be out of town the weelk of 04/06 for two weeks.  Rescheduled patient appt per patient request.  Patient aware of new appt date and time.

## 2018-12-26 ENCOUNTER — Other Ambulatory Visit: Payer: Self-pay | Admitting: Internal Medicine

## 2018-12-30 ENCOUNTER — Telehealth: Payer: Self-pay | Admitting: Internal Medicine

## 2018-12-30 NOTE — Telephone Encounter (Signed)
Moved 3/26 f/u to 4/6. Patient moved from 4/6 to 4/1.

## 2019-01-09 MED FILL — GILOTRIF 30 MG TABLET: 30 | 30 days supply | Qty: 30 | Fill #2

## 2019-01-16 ENCOUNTER — Other Ambulatory Visit: Payer: Medicare Other

## 2019-01-16 ENCOUNTER — Ambulatory Visit: Payer: Medicare Other | Admitting: Internal Medicine

## 2019-01-22 ENCOUNTER — Encounter: Payer: Self-pay | Admitting: Internal Medicine

## 2019-01-22 ENCOUNTER — Inpatient Hospital Stay: Payer: Medicare Other | Attending: Internal Medicine

## 2019-01-22 ENCOUNTER — Other Ambulatory Visit: Payer: Self-pay

## 2019-01-22 ENCOUNTER — Other Ambulatory Visit: Payer: Self-pay | Admitting: Pharmacist

## 2019-01-22 ENCOUNTER — Inpatient Hospital Stay (HOSPITAL_BASED_OUTPATIENT_CLINIC_OR_DEPARTMENT_OTHER): Payer: Medicare Other | Admitting: Internal Medicine

## 2019-01-22 VITALS — BP 151/82 | HR 89 | Temp 98.2°F | Resp 20 | Ht 61.0 in | Wt 159.3 lb

## 2019-01-22 DIAGNOSIS — C3432 Malignant neoplasm of lower lobe, left bronchus or lung: Secondary | ICD-10-CM | POA: Insufficient documentation

## 2019-01-22 DIAGNOSIS — F419 Anxiety disorder, unspecified: Secondary | ICD-10-CM

## 2019-01-22 DIAGNOSIS — C3490 Malignant neoplasm of unspecified part of unspecified bronchus or lung: Secondary | ICD-10-CM

## 2019-01-22 DIAGNOSIS — C7802 Secondary malignant neoplasm of left lung: Secondary | ICD-10-CM | POA: Insufficient documentation

## 2019-01-22 DIAGNOSIS — Z79899 Other long term (current) drug therapy: Secondary | ICD-10-CM | POA: Diagnosis not present

## 2019-01-22 DIAGNOSIS — Z7982 Long term (current) use of aspirin: Secondary | ICD-10-CM | POA: Diagnosis not present

## 2019-01-22 DIAGNOSIS — R197 Diarrhea, unspecified: Secondary | ICD-10-CM

## 2019-01-22 DIAGNOSIS — Z791 Long term (current) use of non-steroidal anti-inflammatories (NSAID): Secondary | ICD-10-CM | POA: Insufficient documentation

## 2019-01-22 DIAGNOSIS — I10 Essential (primary) hypertension: Secondary | ICD-10-CM | POA: Diagnosis not present

## 2019-01-22 DIAGNOSIS — C7801 Secondary malignant neoplasm of right lung: Secondary | ICD-10-CM | POA: Diagnosis not present

## 2019-01-22 DIAGNOSIS — Z5111 Encounter for antineoplastic chemotherapy: Secondary | ICD-10-CM

## 2019-01-22 DIAGNOSIS — C3492 Malignant neoplasm of unspecified part of left bronchus or lung: Secondary | ICD-10-CM

## 2019-01-22 LAB — CMP (CANCER CENTER ONLY)
ALT: 21 U/L (ref 0–44)
AST: 19 U/L (ref 15–41)
Albumin: 3.6 g/dL (ref 3.5–5.0)
Alkaline Phosphatase: 73 U/L (ref 38–126)
Anion gap: 10 (ref 5–15)
BUN: 14 mg/dL (ref 8–23)
CO2: 26 mmol/L (ref 22–32)
Calcium: 9.1 mg/dL (ref 8.9–10.3)
Chloride: 105 mmol/L (ref 98–111)
Creatinine: 1.05 mg/dL — ABNORMAL HIGH (ref 0.44–1.00)
GFR, Est AFR Am: >60 mL/min (ref >60)
GFR, Estimated: 53 mL/min — ABNORMAL LOW (ref >60)
Glucose, Bld: 81 mg/dL (ref 70–99)
Potassium: 4 mmol/L (ref 3.5–5.1)
Sodium: 141 mmol/L (ref 135–145)
Total Bilirubin: 1.1 mg/dL (ref 0.3–1.2)
Total Protein: 7.1 g/dL (ref 6.5–8.1)

## 2019-01-22 LAB — CBC WITH DIFFERENTIAL (CANCER CENTER ONLY)
Abs Immature Granulocytes: 0.02 10*3/uL (ref 0.00–0.07)
Basophils Absolute: 0 10*3/uL (ref 0.0–0.1)
Basophils Relative: 1 %
Eosinophils Absolute: 0.1 10*3/uL (ref 0.0–0.5)
Eosinophils Relative: 1 %
HCT: 40.2 % (ref 36.0–46.0)
Hemoglobin: 12.8 g/dL (ref 12.0–15.0)
Immature Granulocytes: 0 %
Lymphocytes Relative: 21 %
Lymphs Abs: 1.3 10*3/uL (ref 0.7–4.0)
MCH: 27.5 pg (ref 26.0–34.0)
MCHC: 31.8 g/dL (ref 30.0–36.0)
MCV: 86.3 fL (ref 80.0–100.0)
Monocytes Absolute: 0.6 10*3/uL (ref 0.1–1.0)
Monocytes Relative: 9 %
Neutro Abs: 4 10*3/uL (ref 1.7–7.7)
Neutrophils Relative %: 68 %
Platelet Count: 179 10*3/uL (ref 150–400)
RBC: 4.66 MIL/uL (ref 3.87–5.11)
RDW: 13 % (ref 11.5–15.5)
WBC Count: 6 10*3/uL (ref 4.0–10.5)
nRBC: 0 % (ref 0.0–0.2)

## 2019-01-22 MED ORDER — AFATINIB DIMALEATE 30 MG PO TABS: ORAL_TABLET | ORAL | 2 refills | Status: DC

## 2019-01-22 NOTE — Progress Notes (Signed)
[place] Telephone:([phone]) [phone]   [phone]  OFFICE PROGRESS NOTE  [name], MD [address] [place] [address]  DIAGNOSIS: Stage IV (T2a, N0, M1a) non-small cell lung cancer, adenocarcinoma with positive EGFR mutation with deletion in exon 19 diagnosed in June 2016 and presented with large mass in the left lower lobe in addition to multiple bilateral pulmonary nodules  PRIOR THERAPY: Gilotrif 40 mg by mouth daily started 05/11/2015, status post 9 months of treatment discontinued on 02/17/2016 secondary to extensive skin rash.  CURRENT THERAPY: Gilotrif 30 mg by mouth daily started 02/21/2016 status post 34 months of treatment.  INTERVAL HISTORY: Elizabeth [name] 73 y.o. female returns to the clinic today for follow-up visit.  The patient is feeling fine today with no concerning complaints except for occasional diarrhea.  She denied having any significant skin rash.  She denied having any weight loss or night sweats.  She has no nausea, vomiting or constipation.  She denied having any headache or visual changes.  She denied having any fever or chills.  She continues to tolerate her treatment with Gilotrif fairly well.  The patient is here today for evaluation and repeat blood work.  MEDICAL HISTORY: Past Medical History:  Diagnosis Date  . Adenocarcinoma of left lung, stage 4 ([place]) 04/05/2015   Biopsy confirmed CT SCAN: There are innumerable bilateral pulmonary nodules. These range in size from about 5 mm to 2 cm. They demonstrate irregular indistinct borders, the larger ones demonstrating spiculation. PET SCAN: Diffuse pulmonary metastatic disease with a 4 cm dominant left lower low lung lesion. No enlarged or hypermetabolic mediastinal or hilar adenopathy.  . Allergy   . Anxiety   . Cancer ([place])   . Change of skin related to chemotherapy 09/25/2017  . Drug-induced skin rash 01/17/2016  . Hyperlipidemia   . Hypertension   .  Insomnia   . Prediabetes   . Thyroid disease     ALLERGIES:  is allergic to penicillins.  MEDICATIONS:  Current Outpatient Medications  Medication Sig Dispense Refill  . ALPRAZolam (XANAX) 1 MG tablet TAKE 1/2 TO 1 TABLET BY MOUTH TWO TO THREE TIMES A DAY(ONLY IF NEEDED FOR ANXIETY ATTACKS, LIMIT TO 5 DAYS PER WEEK TO AVOID ADDICTION) 90 tablet 0  . aspirin 81 MG tablet Take 81 mg by mouth at bedtime.     . Cholecalciferol (VITAMIN D3) 10000 UNITS TABS Take 10,000 Units by mouth 3 (three) times a week.     . Cyanocobalamin (VITAMIN B 12 PO) Take 1 tablet by mouth daily.    . diphenoxylate-atropine (LOMOTIL) 2.5-0.025 MG tablet 1 to 2 PO QID prn diarrhea 40 tablet 2  . GILOTRIF 30 MG tablet TAKE 1 TABLET (30MG) BY MOUTH ONCE DAILY. TAKE ON AN EMPTY STOMACH 1 HOUR BEFORE OR 2 HOURS AFTER A MEAL 30 tablet 2  . HYDROcodone-homatropine (HYCODAN) 5-1.5 MG/5ML syrup Take 5 mLs by mouth every 6 (six) hours as needed for cough. 120 mL 0  . ibuprofen (ADVIL,MOTRIN) 200 MG tablet Take 200 mg by mouth every 6 (six) hours as needed.    Marland Kitchen levothyroxine (SYNTHROID, LEVOTHROID) 50 MCG tablet TAKE 1 TABLET BY MOUTH EVERY DAY 90 tablet 1  . Melatonin 10 MG CAPS Take 1 capsule by mouth at bedtime as needed.    . montelukast (SINGULAIR) 10 MG tablet TAKE 1 TABLET BY MOUTH EVERY DAY 90 tablet 3  . mupirocin ointment (BACTROBAN) 2 % PLACE 1 APPLICATION  INTO THE NOSE 2 TIMES DAILY. 22 g 0   No current facility-administered medications for this visit.     SURGICAL HISTORY:  Past Surgical History:  Procedure Laterality Date  . ABDOMINAL HYSTERECTOMY  1995   w BSO  . TONSILLECTOMY AND ADENOIDECTOMY      REVIEW OF SYSTEMS:  A comprehensive review of systems was negative except for: Gastrointestinal: positive for diarrhea   PHYSICAL EXAMINATION: General appearance: alert, cooperative and no distress Head: Normocephalic, without obvious abnormality, atraumatic Neck: no adenopathy, no JVD, supple,  symmetrical, trachea midline and thyroid not enlarged, symmetric, no tenderness/mass/nodules Lymph nodes: Cervical, supraclavicular, and axillary nodes normal. Resp: clear to auscultation bilaterally Back: symmetric, no curvature. ROM normal. No CVA tenderness. Cardio: regular rate and rhythm, S1, S2 normal, no murmur, click, rub or gallop GI: soft, non-tender; bowel sounds normal; no masses,  no organomegaly Extremities: extremities normal, atraumatic, no cyanosis or edema  ECOG PERFORMANCE STATUS: 0 - Asymptomatic  Blood pressure (!) 151/82, pulse 89, temperature 98.2 F (36.8 C), temperature source Oral, resp. rate 20, height _0  (1.549 m), weight 159 lb 4.8 oz (72.3 kg), SpO2 100 %.  LABORATORY DATA: Lab Results  Component Value Date   WBC 6.0 01/22/2019   HGB 12.8 01/22/2019   HCT 40.2 01/22/2019   MCV 86.3 01/22/2019   PLT 179 01/22/2019      Chemistry      Component Value Date/Time   NA 142 11/21/2018 1121   NA 139 10/26/2017 1252   K 4.4 11/21/2018 1121   K 4.1 10/26/2017 1252   CL 105 11/21/2018 1121   CO2 29 11/21/2018 1121   CO2 28 10/26/2017 1252   BUN 14 11/21/2018 1121   BUN 15.7 10/26/2017 1252   CREATININE 1.09 (H) 11/21/2018 1121   CREATININE 0.99 (H) 10/07/2018 1446   CREATININE 1.0 10/26/2017 1252      Component Value Date/Time   CALCIUM 8.8 (L) 11/21/2018 1121   CALCIUM 8.8 10/26/2017 1252   ALKPHOS 62 11/21/2018 1121   ALKPHOS 70 10/26/2017 1252   AST 20 11/21/2018 1121   AST 19 10/26/2017 1252   ALT 17 11/21/2018 1121   ALT 22 10/26/2017 1252   BILITOT 0.9 11/21/2018 1121   BILITOT 0.90 10/26/2017 1252       RADIOGRAPHIC STUDIES: No results found.  ASSESSMENT AND PLAN:  This is a very pleasant 73 years old white female with stage IV non-small cell lung cancer, adenocarcinoma with positive EGFR mutation with deletion in exon 19 and currently undergoing treatment with Gilotrif initially at a dose of 40 mg for 9 months followed by 30 mg  by mouth daily status post 34 months.  The patient continues to tolerate her treatment well with no concerning adverse effects. I recommended for her to proceed with her current treatment as planned. I will see her back for follow-up visit in 2 months for evaluation with repeat CT scan of the chest, abdomen and pelvis for restaging of her disease. The patient was advised to call immediately if she has any concerning symptoms in the interval. The patient voices understanding of current disease status and treatment options and is in agreement with the current care plan. All questions were answered. The patient knows to call the clinic with any problems, questions or concerns. We can certainly see the patient much sooner if necessary.  Disclaimer: This note was dictated with voice recognition software. Similar sounding words can inadvertently be transcribed and may not be corrected upon  review.

## 2019-01-27 ENCOUNTER — Ambulatory Visit: Payer: Medicare Other | Admitting: Internal Medicine

## 2019-01-27 ENCOUNTER — Other Ambulatory Visit: Payer: Medicare Other

## 2019-02-05 MED FILL — GILOTRIF 30 MG TABLET: 30 | 30 days supply | Qty: 30 | Fill #0

## 2019-02-20 ENCOUNTER — Other Ambulatory Visit: Payer: Self-pay | Admitting: Internal Medicine

## 2019-03-06 MED FILL — GILOTRIF 30 MG TABLET: 30 | 30 days supply | Qty: 30 | Fill #1

## 2019-03-21 ENCOUNTER — Ambulatory Visit (HOSPITAL_COMMUNITY)
Admission: RE | Admit: 2019-03-21 | Discharge: 2019-03-21 | Disposition: A | Discharge status: Home / Self Care | Payer: Medicare Other | Source: Ambulatory Visit | Attending: Internal Medicine | Admitting: Internal Medicine

## 2019-03-21 ENCOUNTER — Encounter (HOSPITAL_COMMUNITY): Payer: Self-pay

## 2019-03-21 ENCOUNTER — Inpatient Hospital Stay: Payer: Medicare Other | Attending: Internal Medicine

## 2019-03-21 ENCOUNTER — Other Ambulatory Visit: Payer: Self-pay

## 2019-03-21 DIAGNOSIS — C3490 Malignant neoplasm of unspecified part of unspecified bronchus or lung: Secondary | ICD-10-CM

## 2019-03-21 DIAGNOSIS — C3432 Malignant neoplasm of lower lobe, left bronchus or lung: Secondary | ICD-10-CM | POA: Insufficient documentation

## 2019-03-21 LAB — CMP (CANCER CENTER ONLY)
ALT: 16 U/L (ref 0–44)
AST: 19 U/L (ref 15–41)
Albumin: 3.6 g/dL (ref 3.5–5.0)
Alkaline Phosphatase: 63 U/L (ref 38–126)
Anion gap: 9 (ref 5–15)
BUN: 18 mg/dL (ref 8–23)
CO2: 25 mmol/L (ref 22–32)
Calcium: 8.9 mg/dL (ref 8.9–10.3)
Chloride: 107 mmol/L (ref 98–111)
Creatinine: 1.09 mg/dL — ABNORMAL HIGH (ref 0.44–1.00)
GFR, Est AFR Am: 59 mL/min — ABNORMAL LOW (ref >60)
GFR, Estimated: 51 mL/min — ABNORMAL LOW (ref >60)
Glucose, Bld: 80 mg/dL (ref 70–99)
Potassium: 4 mmol/L (ref 3.5–5.1)
Sodium: 141 mmol/L (ref 135–145)
Total Bilirubin: 0.9 mg/dL (ref 0.3–1.2)
Total Protein: 6.9 g/dL (ref 6.5–8.1)

## 2019-03-21 LAB — CBC WITH DIFFERENTIAL (CANCER CENTER ONLY)
Abs Immature Granulocytes: 0.01 10*3/uL (ref 0.00–0.07)
Basophils Absolute: 0 10*3/uL (ref 0.0–0.1)
Basophils Relative: 1 %
Eosinophils Absolute: 0.1 10*3/uL (ref 0.0–0.5)
Eosinophils Relative: 2 %
HCT: 40.4 % (ref 36.0–46.0)
Hemoglobin: 12.7 g/dL (ref 12.0–15.0)
Immature Granulocytes: 0 %
Lymphocytes Relative: 21 %
Lymphs Abs: 1.3 10*3/uL (ref 0.7–4.0)
MCH: 27.1 pg (ref 26.0–34.0)
MCHC: 31.4 g/dL (ref 30.0–36.0)
MCV: 86.3 fL (ref 80.0–100.0)
Monocytes Absolute: 0.6 10*3/uL (ref 0.1–1.0)
Monocytes Relative: 10 %
Neutro Abs: 4 10*3/uL (ref 1.7–7.7)
Neutrophils Relative %: 66 %
Platelet Count: 177 10*3/uL (ref 150–400)
RBC: 4.68 MIL/uL (ref 3.87–5.11)
RDW: 13.1 % (ref 11.5–15.5)
WBC Count: 6 10*3/uL (ref 4.0–10.5)
nRBC: 0 % (ref 0.0–0.2)

## 2019-03-21 MED ORDER — SODIUM CHLORIDE (PF) 0.9 % IJ SOLN
INTRAMUSCULAR | Status: AC
  Filled 2019-03-21: qty 50

## 2019-03-21 MED ORDER — IOHEXOL 300 MG/ML  SOLN
100.0000 mL | Freq: Once | INTRAMUSCULAR | Status: AC | PRN
  Administered 2019-03-21: 100 mL via INTRAVENOUS

## 2019-03-24 ENCOUNTER — Encounter: Payer: Self-pay | Admitting: Internal Medicine

## 2019-03-24 ENCOUNTER — Other Ambulatory Visit: Payer: Self-pay

## 2019-03-24 ENCOUNTER — Inpatient Hospital Stay: Payer: Medicare Other | Attending: Internal Medicine | Admitting: Internal Medicine

## 2019-03-24 VITALS — BP 135/78 | HR 76 | Temp 98.3°F | Resp 18 | Ht 61.0 in | Wt 160.7 lb

## 2019-03-24 DIAGNOSIS — Z7982 Long term (current) use of aspirin: Secondary | ICD-10-CM

## 2019-03-24 DIAGNOSIS — C3432 Malignant neoplasm of lower lobe, left bronchus or lung: Secondary | ICD-10-CM | POA: Diagnosis present

## 2019-03-24 DIAGNOSIS — I10 Essential (primary) hypertension: Secondary | ICD-10-CM | POA: Diagnosis not present

## 2019-03-24 DIAGNOSIS — Z79899 Other long term (current) drug therapy: Secondary | ICD-10-CM | POA: Diagnosis not present

## 2019-03-24 DIAGNOSIS — F419 Anxiety disorder, unspecified: Secondary | ICD-10-CM | POA: Diagnosis not present

## 2019-03-24 DIAGNOSIS — C3492 Malignant neoplasm of unspecified part of left bronchus or lung: Secondary | ICD-10-CM

## 2019-03-24 DIAGNOSIS — M25562 Pain in left knee: Secondary | ICD-10-CM

## 2019-03-24 DIAGNOSIS — Z5111 Encounter for antineoplastic chemotherapy: Secondary | ICD-10-CM

## 2019-03-24 DIAGNOSIS — Z791 Long term (current) use of non-steroidal anti-inflammatories (NSAID): Secondary | ICD-10-CM | POA: Insufficient documentation

## 2019-03-24 NOTE — Progress Notes (Signed)
[place] [phone]) [phone]   Fax:(336) [phone]  OFFICE PROGRESS NOTE  [name], MD [address] [place] [address]  DIAGNOSIS: Stage IV (T2a, N0, M1a) non-small cell lung cancer, adenocarcinoma with positive EGFR mutation with deletion in exon 19 diagnosed in June 2016 and presented with large mass in the left lower lobe in addition to multiple bilateral pulmonary nodules  PRIOR THERAPY: Gilotrif 40 mg by mouth daily started 05/11/2015, status post 9 months of treatment discontinued on 02/17/2016 secondary to extensive skin rash.  CURRENT THERAPY: Gilotrif 30 mg by mouth daily started 02/21/2016 status post 36 months of treatment.  INTERVAL HISTORY: Elizabeth [name] 73 y.o. female returns to the clinic today for follow-up visit.  The patient is feeling fine today with no concerning complaints except for left knee pain.  He denied having any chest pain, shortness of breath, cough or hemoptysis.  She denied having any fever or chills.  She has no nausea, vomiting, diarrhea or constipation.  She denied having any headache or visual changes.  She has been tolerating her treatment with Gilotrif fairly well.  The patient had repeat CT scan of the chest, abdomen and pelvis performed recently.  Here for evaluation and discussion of her with the results.  MEDICAL HISTORY: Past Medical History:  Diagnosis Date   Adenocarcinoma of left lung, stage 4 ([place]) 04/05/2015   Biopsy confirmed CT SCAN: There are innumerable bilateral pulmonary nodules. These range in size from about 5 mm to 2 cm. They demonstrate irregular indistinct borders, the larger ones demonstrating spiculation. PET SCAN: Diffuse pulmonary metastatic disease with a 4 cm dominant left lower low lung lesion. No enlarged or hypermetabolic mediastinal or hilar adenopathy.   Allergy    Anxiety    Cancer ([place])    Change of skin related to chemotherapy 09/25/2017   Drug-induced skin  rash 01/17/2016   Hyperlipidemia    Hypertension    Insomnia    Prediabetes    Thyroid disease     ALLERGIES:  is allergic to penicillins.  MEDICATIONS:  Current Outpatient Medications  Medication Sig Dispense Refill   afatinib dimaleate (GILOTRIF) 30 MG tablet TAKE 1 TABLET (30MG) BY MOUTH ONCE DAILY. TAKE ON AN EMPTY STOMACH 1 HOUR BEFORE OR 2 HOURS AFTER A MEAL 30 tablet 2   ALPRAZolam (XANAX) 1 MG tablet TAKE 1/2 TO 1 TABLET BY MOUTH 2-3 TIMES DAILY ONLY IF NEEDED FOR ANXIETY ATTACKS. LIMIT TO 5 DAYS PER WEEK TO AVOID ADDICTION. 90 tablet 0   aspirin 81 MG tablet Take 81 mg by mouth at bedtime.      Cholecalciferol (VITAMIN D3) 10000 UNITS TABS Take 10,000 Units by mouth 3 (three) times a week.      Cyanocobalamin (VITAMIN B 12 PO) Take 1 tablet by mouth daily.     diphenoxylate-atropine (LOMOTIL) 2.5-0.025 MG tablet 1 to 2 PO QID prn diarrhea 40 tablet 2   HYDROcodone-homatropine (HYCODAN) 5-1.5 MG/5ML syrup Take 5 mLs by mouth every 6 (six) hours as needed for cough. 120 mL 0   ibuprofen (ADVIL,MOTRIN) 200 MG tablet Take 200 mg by mouth every 6 (six) hours as needed.     levothyroxine (SYNTHROID, LEVOTHROID) 50 MCG tablet TAKE 1 TABLET BY MOUTH EVERY DAY 90 tablet 1   Melatonin 10 MG CAPS Take 1 capsule by mouth at bedtime as needed.     montelukast (SINGULAIR) 10 MG tablet TAKE 1 TABLET BY MOUTH EVERY DAY 90  tablet 3   No current facility-administered medications for this visit.     SURGICAL HISTORY:  Past Surgical History:  Procedure Laterality Date   ABDOMINAL HYSTERECTOMY  1995   w BSO   TONSILLECTOMY AND ADENOIDECTOMY      REVIEW OF SYSTEMS:  Constitutional: negative Eyes: negative Ears, nose, mouth, throat, and face: negative Respiratory: negative Cardiovascular: negative Gastrointestinal: negative Genitourinary:negative Integument/breast: negative Hematologic/lymphatic: negative Musculoskeletal:positive for arthralgias Neurological:  negative Behavioral/Psych: negative Endocrine: negative Allergic/Immunologic: negative   PHYSICAL EXAMINATION: General appearance: alert, cooperative and no distress Head: Normocephalic, without obvious abnormality, atraumatic Neck: no adenopathy, no JVD, supple, symmetrical, trachea midline and thyroid not enlarged, symmetric, no tenderness/mass/nodules Lymph nodes: Cervical, supraclavicular, and axillary nodes normal. Resp: clear to auscultation bilaterally Back: symmetric, no curvature. ROM normal. No CVA tenderness. Cardio: regular rate and rhythm, S1, S2 normal, no murmur, click, rub or gallop GI: soft, non-tender; bowel sounds normal; no masses,  no organomegaly Extremities: extremities normal, atraumatic, no cyanosis or edema Neurologic: Alert and oriented X 3, normal strength and tone. Normal symmetric reflexes. Normal coordination and gait  ECOG PERFORMANCE STATUS: 0 - Asymptomatic  Blood pressure 135/78, pulse 76, temperature 98.3 F (36.8 C), temperature source Oral, resp. rate 18, height 5' 1" (1.549 m), weight 160 lb 11.2 oz (72.9 kg), SpO2 100 %.  LABORATORY DATA: Lab Results  Component Value Date   WBC 6.0 03/21/2019   HGB 12.7 03/21/2019   HCT 40.4 03/21/2019   MCV 86.3 03/21/2019   PLT 177 03/21/2019      Chemistry      Component Value Date/Time   NA 141 03/21/2019 0918   NA 139 10/26/2017 1252   K 4.0 03/21/2019 0918   K 4.1 10/26/2017 1252   CL 107 03/21/2019 0918   CO2 25 03/21/2019 0918   CO2 28 10/26/2017 1252   BUN 18 03/21/2019 0918   BUN 15.7 10/26/2017 1252   CREATININE 1.09 (H) 03/21/2019 0918   CREATININE 0.99 (H) 10/07/2018 1446   CREATININE 1.0 10/26/2017 1252      Component Value Date/Time   CALCIUM 8.9 03/21/2019 0918   CALCIUM 8.8 10/26/2017 1252   ALKPHOS 63 03/21/2019 0918   ALKPHOS 70 10/26/2017 1252   AST 19 03/21/2019 0918   AST 19 10/26/2017 1252   ALT 16 03/21/2019 0918   ALT 22 10/26/2017 1252   BILITOT 0.9 03/21/2019  0918   BILITOT 0.90 10/26/2017 1252       RADIOGRAPHIC STUDIES: Ct Chest W Contrast  Result Date: 03/21/2019 CLINICAL DATA:  Left lower lobe adenocarcinoma. EXAM: CT CHEST, ABDOMEN, AND PELVIS WITH CONTRAST TECHNIQUE: Multidetector CT imaging of the chest, abdomen and pelvis was performed following the standard protocol during bolus administration of intravenous contrast. CONTRAST:  127m OMNIPAQUE IOHEXOL 300 MG/ML  SOLN COMPARISON:  08/30/2018 FINDINGS: CT CHEST FINDINGS Cardiovascular: The heart size is normal. No substantial pericardial effusion. No thoracic aortic aneurysm. Mediastinum/Nodes: No mediastinal lymphadenopathy. There is no hilar lymphadenopathy. The esophagus has normal imaging features. There is no axillary lymphadenopathy. Lungs/Pleura: Left lower lobe infrahilar scarring is stable. As on the prior study, there are numerous areas of ill-defined spiculated and nodular ground-glass attenuation scattered in both lungs, compatible with treated metastases. 1 of the more dominant lesions is in the right lower lobe measuring 9 mm today (68/6) unchanged in the interval. No new or progressive nodule or mass in the lungs. No pleural effusion. Musculoskeletal: No worrisome lytic or sclerotic osseous abnormality. 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 There is no evidence for gallstones, gallbladder wall thickening, or pericholecystic fluid. No intrahepatic or extrahepatic biliary dilation. Pancreas: No focal mass lesion. No dilatation of the main duct. No intraparenchymal cyst. No peripancreatic edema. Spleen: No splenomegaly. No focal mass lesion. Adrenals/Urinary Tract: No adrenal nodule or mass. Kidneys unremarkable. No evidence for hydroureter. The urinary bladder appears normal for the degree of distention. Stomach/Bowel: Stomach is unremarkable. No gastric wall thickening. No evidence of outlet obstruction.  Duodenum is normally positioned as is the ligament of Treitz. No small bowel wall thickening. No small bowel dilatation. The terminal ileum is normal. The appendix is not visualized, but there is no edema or inflammation in the region of the cecum. No gross colonic mass. No colonic wall thickening. Vascular/Lymphatic: There is abdominal aortic atherosclerosis without aneurysm. There is no gastrohepatic or hepatoduodenal ligament lymphadenopathy. No intraperitoneal or retroperitoneal lymphadenopathy. No pelvic sidewall lymphadenopathy. Reproductive: The uterus is surgically absent. There is no adnexal mass. Other: No intraperitoneal free fluid. Musculoskeletal: Sclerotic focus in the left pubic bone is stable and compatible with bone island. No worrisome lytic or sclerotic osseous abnormality. IMPRESSION: 1. Stable exam.  No new or progressive interval findings. 2. 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 Multiple scattered areas of ill-defined nodularity and architectural distortion in both lungs compatible with treated metastatic disease. 3.  Aortic Atherosclerois (ICD10-170.0) Electronically Signed   By: Misty Stanley M.D.   On: 03/21/2019 10:42   Ct Abdomen Pelvis W Contrast  Result Date: 03/21/2019 CLINICAL DATA:  Left lower lobe adenocarcinoma. EXAM: CT CHEST, ABDOMEN, AND PELVIS WITH CONTRAST TECHNIQUE: Multidetector CT imaging of the chest, abdomen and pelvis was performed following the standard protocol during bolus administration of intravenous contrast. CONTRAST:  168m OMNIPAQUE IOHEXOL 300 MG/ML  SOLN COMPARISON:  08/30/2018 FINDINGS: CT CHEST FINDINGS Cardiovascular: The heart size is normal. No substantial pericardial effusion. No thoracic aortic aneurysm. Mediastinum/Nodes: No mediastinal lymphadenopathy. There is no hilar lymphadenopathy. The esophagus has normal imaging features. There is no axillary lymphadenopathy. Lungs/Pleura: Left lower lobe infrahilar  scarring is stable. As on the prior study, there are numerous areas of ill-defined spiculated and nodular ground-glass attenuation scattered in both lungs, compatible with treated metastases. 1 of the more dominant lesions is in the right lower lobe measuring 9 mm today (68/6) unchanged in the interval. No new or progressive nodule or mass in the lungs. No pleural effusion. Musculoskeletal: No worrisome lytic or sclerotic osseous abnormality. 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 There is no evidence for gallstones, gallbladder wall thickening, or pericholecystic fluid. No intrahepatic or extrahepatic biliary dilation. Pancreas: No focal mass lesion. No dilatation of the main duct. No intraparenchymal cyst. No peripancreatic edema. Spleen: No splenomegaly. No focal mass lesion. Adrenals/Urinary Tract: No adrenal nodule or mass. Kidneys unremarkable. No evidence for hydroureter. The urinary bladder appears normal for the degree of distention. Stomach/Bowel: Stomach is unremarkable. No gastric wall thickening. No evidence of outlet obstruction. Duodenum is normally positioned as is the ligament of Treitz. No small bowel wall thickening. No small bowel dilatation. The terminal ileum is normal. The appendix is not visualized, but there is no edema or inflammation in the region of the cecum. No gross colonic mass. No colonic wall thickening. Vascular/Lymphatic: There is abdominal aortic atherosclerosis without aneurysm. There is no gastrohepatic or hepatoduodenal ligament lymphadenopathy. No intraperitoneal or retroperitoneal lymphadenopathy. No pelvic sidewall lymphadenopathy.  Reproductive: The uterus is surgically absent. There is no adnexal mass. Other: No intraperitoneal free fluid. Musculoskeletal: Sclerotic focus in the left pubic bone is stable and compatible with bone island. No worrisome lytic or sclerotic osseous abnormality.  IMPRESSION: 1. Stable exam.  No new or progressive interval findings. 2. 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 Multiple scattered areas of ill-defined nodularity and architectural distortion in both lungs compatible with treated metastatic disease. 3.  Aortic Atherosclerois (ICD10-170.0) Electronically Signed   By: Misty Stanley M.D.   On: 03/21/2019 10:42    ASSESSMENT AND PLAN:  This is a very pleasant 73 years old white female with stage IV non-small cell lung cancer, adenocarcinoma with positive EGFR mutation with deletion in exon 19 and currently undergoing treatment with Gilotrif initially at a dose of 40 mg for 9 months followed by 30 mg by mouth daily status post 36 months.  The patient is feeling fine today with no concerning pain.  He continues to tolerate her treatment with.Gilotrif fairly well. He has been admitted CT scan of the chest, abdomen and pelvis performed recently.  I personally and independently reviewed the scans and discussed the results with the patient today. Her scan showed no concerning findings for disease progression. I recommended for the patient to continue with her current treatment with Gilotrif with the same dose. I will see her back for follow-up visit in 2 months for evaluation and repeat blood work. She was advised to call immediately if she has any concerning symptoms in the interval. The patient voices understanding of current disease status and treatment options and is in agreement with the current care plan. All questions were answered. The patient knows to call the clinic with any problems, questions or concerns. We can certainly see the patient much sooner if necessary.  Disclaimer: This note was dictated with voice recognition software. Similar sounding words can inadvertently be transcribed and may not be corrected upon review.

## 2019-03-25 ENCOUNTER — Telehealth: Payer: Self-pay | Admitting: Internal Medicine

## 2019-03-25 NOTE — Telephone Encounter (Signed)
Scheduled appt per 6/1 los - mail letter for 2 month apt with appt date and time

## 2019-03-31 ENCOUNTER — Other Ambulatory Visit: Payer: Self-pay | Admitting: Physician Assistant

## 2019-04-04 NOTE — Progress Notes (Signed)
MEDICARE WELLNESS  Assessment and Plan:  Essential hypertension - continue medications, DASH diet, exercise and monitor at home. Call if greater than 130/80.  -     CBC with Differential/Platelet -     BASIC METABOLIC PANEL WITH GFR -     Hepatic function panel -     TSH  Aortic atherosclerosis (HCC) Control blood pressure, cholesterol, glucose, increase exercise.  -     Lipid panel  Adenocarcinoma of left lung, stage 4 (HCC) Follow up oncology  Acquired hypothyroidism -     TSH Hypothyroidism-check TSH level, continue medications the same, reminded to take on an empty stomach 30-28mins before food.   Hyperlipidemia -     Lipid panel -continue medications, check lipids, decrease fatty foods, increase activity.   Medication management -     Magnesium  Encounter for antineoplastic chemotherapy Continue follow up  Anxiety continue medications, stress management techniques discussed, increase water, good sleep hygiene discussed, increase exercise, and increase veggies.   Insomnia, unspecified type Takes xanax PRN  Vitamin D deficiency -     VITAMIN D 25 Hydroxy (Vit-D Deficiency, Fractures)  BMI 29.0-29.9,adult - long discussion about weight loss, diet, and exercise -recommended diet heavy in fruits and veggies and low in animal meats, cheeses, and dairy products  Discussed med's effects and SE's. Screening labs and tests as requested with regular follow-up as recommended. Future Appointments  Date Time Provider [place]  05/21/2019  9:00 AM [facility] LAB 3 [facility] None  05/21/2019  9:30 AM Curt Bears, MD [facility] None  10/08/2019 11:00 AM [name], MD [facility] None    HPI  73 y.o. female  presents for medicare wellness and follow up  Her blood pressure has been controlled at home, today their BP is BP: 122/80.  She does not workout. She denies chest pain, shortness of breath, dizziness.   BMI is Body mass index is 30.72 kg/m.,  she is working on diet and exercise. Wt Readings from Last 3 Encounters:  04/08/19 162 lb 9.6 oz (73.8 kg)  03/24/19 160 lb 11.2 oz (72.9 kg)  01/22/19 159 lb 4.8 oz (72.3 kg)   She has history of stage 4 lung cancer, on Glotinef maintenance therapy 30 mg and following with Dr. Julien Nordmann. Recent CT scan showed no progression of disease.   Got gel shot on left knee yesterday, has 2 more to go. This is the 3rd time, has bone on bone per patient. Dr. Latanya Maudlin.  She is on cholesterol medication and denies myalgias. Her cholesterol is at goal. The cholesterol last visit was:  Lab Results  Component Value Date   CHOL 187 10/07/2018   HDL 50 (L) 10/07/2018   LDLCALC 115 (H) 10/07/2018   TRIG 113 10/07/2018   CHOLHDL 3.7 10/07/2018  .  Last A1C in the office was:  Lab Results  Component Value Date   HGBA1C 5.4 03/27/2018   Patient is on Vitamin D supplement.   Lab Results  Component Value Date   VD25OH 100 10/07/2018     She is on thyroid medication. Her medication was not changed last visit.She is not on biotin.   Lab Results  Component Value Date   TSH 4.02 10/07/2018  .   Current Medications:  Current Outpatient Medications on File Prior to Visit  Medication Sig Dispense Refill  . afatinib dimaleate (GILOTRIF) 30 MG tablet TAKE 1 TABLET (30MG ) BY MOUTH ONCE DAILY. TAKE ON AN EMPTY STOMACH 1 HOUR BEFORE OR 2 HOURS AFTER A  MEAL 30 tablet 2  . ALPRAZolam (XANAX) 1 MG tablet TAKE 1/2 TO 1 TABLET BY MOUTH 2 TO 3 TIMES DAILY ONLY IF NEEDED FOR ANXIETY ATTACKS. LIMIT TO 5 DAYS PER WEEK TO AVOID ADDICTION 90 tablet 0  . aspirin 81 MG tablet Take 81 mg by mouth at bedtime.     . Cholecalciferol (VITAMIN D3) 10000 UNITS TABS Take 10,000 Units by mouth 3 (three) times a week.     . Cyanocobalamin (VITAMIN B 12 PO) Take 1 tablet by mouth daily.    Marland Kitchen HYDROcodone-homatropine (HYCODAN) 5-1.5 MG/5ML syrup Take 5 mLs by mouth every 6 (six) hours as needed for cough. 120 mL 0  . ibuprofen  (ADVIL,MOTRIN) 200 MG tablet Take 200 mg by mouth every 6 (six) hours as needed.    Marland Kitchen levothyroxine (SYNTHROID, LEVOTHROID) 50 MCG tablet TAKE 1 TABLET BY MOUTH EVERY DAY 90 tablet 1  . Melatonin 10 MG CAPS Take 1 capsule by mouth at bedtime as needed.    . montelukast (SINGULAIR) 10 MG tablet TAKE 1 TABLET BY MOUTH EVERY DAY 90 tablet 3  . diphenoxylate-atropine (LOMOTIL) 2.5-0.025 MG tablet 1 to 2 PO QID prn diarrhea 40 tablet 2   No current facility-administered medications on file prior to visit.     Health Maintenance:   Immunization History  Administered Date(s) Administered  . Influenza Split 07/16/2018  . Influenza, High Dose Seasonal PF 07/31/2014, 08/06/2017  . Influenza-Unspecified 09/13/2015, 07/23/2016, 08/06/2017  . Pneumococcal Conjugate-13 12/24/2015  . Pneumococcal Polysaccharide-23 07/22/2012  . Td 12/25/2005  . Zoster 01/09/2007   Tetanus: 2007 DUE declines Pneumovax: 2013 Prevnar: 2017 Flu vaccine:2019 Zostavax:2008 discussed new shot- has not had yet  Pap: Hysterectomy MGM:10/31/2018 will get in [place]: 10/2018 will get with MGM Colonoscopy: 2014 PET 2016 MRI brain 2016 CT AB/chest 02/2019  Patient Care Team: [name], MD as PCP - General (Internal Medicine) Sharol Roussel, [place] as Physician Assistant (Optometry) Jettie Booze, MD as Consulting Physician (Interventional Cardiology) Teena Irani, MD (Inactive) as Consulting Physician (Gastroenterology) Druscilla Brownie, MD as Consulting Physician (Dermatology) Clent Jacks, MD as Consulting Physician (Ophthalmology) Erroll Luna, MD as Consulting Physician (General Surgery) Melrose Nakayama, MD as Consulting Physician (Orthopedic Surgery)  Medical History:  Past Medical History:  Diagnosis Date  . Adenocarcinoma of left lung, stage 4 ([place]) 04/05/2015   Biopsy confirmed CT SCAN: There are innumerable bilateral pulmonary nodules. These range in size from about 5 mm to 2 cm. They  demonstrate irregular indistinct borders, the larger ones demonstrating spiculation. PET SCAN: Diffuse pulmonary metastatic disease with a 4 cm dominant left lower low lung lesion. No enlarged or hypermetabolic mediastinal or hilar adenopathy.  . Allergy   . Anxiety   . Cancer ([place])   . Change of skin related to chemotherapy 09/25/2017  . Drug-induced skin rash 01/17/2016  . Hyperlipidemia   . Hypertension   . Insomnia   . Prediabetes   . Thyroid disease    Allergies Allergies  Allergen Reactions  . Penicillins Swelling and Rash   SURGICAL HISTORY She  has a past surgical history that includes Tonsillectomy and adenoidectomy and Abdominal hysterectomy (1995). FAMILY HISTORY Her family history includes ALS in her father; Alcohol abuse in her mother and paternal grandfather; Bone cancer in her maternal uncle; Breast cancer in her cousin; Dementia (age of onset: 53) in her paternal aunt; Dementia (age of onset: 40) in her maternal grandmother; Heart disease in her paternal grandfather; Hypertension in her father; Liver disease  in her mother; Lung cancer (age of onset: 61) in her paternal grandmother; Lung disease in her maternal grandfather; Melanoma in her paternal aunt. SOCIAL HISTORY She  reports that she quit smoking about 44 years ago. She has a 5.00 pack-year smoking history. She has never used smokeless tobacco. She reports that she does not drink alcohol or use drugs.   MEDICARE WELLNESS OBJECTIVES: Physical activity: Current Exercise Habits: Home exercise routine, Type of exercise: walking(the dog), Intensity: Mild Cardiac risk factors: Cardiac Risk Factors include: advanced age (>28men, >16 women);dyslipidemia;hypertension;sedentary lifestyle;obesity (BMI >30kg/m2) Depression/mood screen:   Depression screen [facility] 2/9 04/08/2019  Decreased Interest 0  Down, Depressed, Hopeless 0  PHQ - 2 Score 0    ADLs:  In your present state of health, do you have any difficulty performing the  following activities: 04/08/2019  Hearing? N  Vision? N  Difficulty concentrating or making decisions? N  Walking or climbing stairs? N  Dressing or bathing? N  Doing errands, shopping? N  Some recent data might be hidden     Cognitive Testing  Alert? Yes  Normal Appearance?Yes  Oriented to person? Yes  Place? Yes   Time? Yes  Recall of three objects?  Yes  Can perform simple calculations? Yes  Displays appropriate judgment?Yes  Can read the correct time from a watch face?Yes  EOL planning: Does Patient Have a Medical Advance Directive?: Yes Type of Advance Directive: Healthcare Power of Attorney, Living will Does patient want to make changes to medical advance directive?: No - Guardian declined   Review of Systems: Review of Systems  Constitutional: Negative for chills, fever and malaise/fatigue.  HENT: Negative for congestion, ear pain and sore throat.   Eyes: Negative.   Respiratory: Positive for shortness of breath. Negative for cough and wheezing.   Cardiovascular: Negative for chest pain, palpitations and leg swelling.  Gastrointestinal: Negative for abdominal pain, blood in stool, constipation, diarrhea, heartburn and melena.  Genitourinary: Negative.   Skin: Negative for itching and rash.  Neurological: Negative for dizziness, sensory change, loss of consciousness and headaches.  Psychiatric/Behavioral: Negative for depression. The patient is not nervous/anxious and does not have insomnia.     Physical Exam: Estimated body mass index is 30.72 kg/m as calculated from the following:   Height as of this encounter: 5\' 1"  (1.549 m).   Weight as of this encounter: 162 lb 9.6 oz (73.8 kg). BP 122/80   Pulse 93   Temp (!) 97.3 F (36.3 C)   Ht 5\' 1"  (1.549 m)   Wt 162 lb 9.6 oz (73.8 kg)   SpO2 98%   BMI 30.72 kg/m   General Appearance: Well nourished well developed, in no apparent distress.  Eyes: PERRLA, EOMs, conjunctiva no swelling or erythema ENT/Mouth: Ear  canals normal without obstruction, swelling, erythema, or discharge.  TMs normal bilaterally with no erythema, bulging, retraction, or loss of landmark.  Oropharynx moist and clear with no exudate, erythema, or swelling.   Neck: Supple, thyroid normal. No bruits.  No cervical adenopathy Respiratory: Respiratory effort normal, Breath sounds clear A&P without wheeze, rhonchi, rales.   Cardio: RRR without murmurs, rubs or gallops. Brisk peripheral pulses without edema.  Chest: symmetric, with normal excursions Breasts: Symmetric, without lumps, nipple discharge, retractions.  Abdomen: Soft, nontender, no guarding, rebound, hernias, masses, or organomegaly.  Lymphatics: Non tender without lymphadenopathy.  Musculoskeletal: Full ROM all peripheral extremities,5/5 strength, and normal gait.  Skin:  Non-tender, warm and dry Neuro: Awake and oriented X 3, Cranial  nerves intact, reflexes equal bilaterally. Normal muscle tone, no cerebellar symptoms. Sensation intact.  Psych:  normal affect, Insight and Judgment appropriate.    Medicare Attestation I have personally reviewed: The patient's medical and social history Their use of alcohol, tobacco or illicit drugs Their current medications and supplements The patient's functional ability including ADLs,fall risks, home safety risks, cognitive, and hearing and visual impairment Diet and physical activities Evidence for depression or mood disorders  The patient's weight, height, BMI, and visual acuity have been recorded in the chart.  I have made referrals, counseling, and provided education to the patient based on review of the above and I have provided the patient with a written personalized care plan for preventive services.    Vicie Mutters 11:41 AM [facility]

## 2019-04-07 MED FILL — GILOTRIF 30 MG TABLET: 30 | 30 days supply | Qty: 30 | Fill #2

## 2019-04-08 ENCOUNTER — Ambulatory Visit (INDEPENDENT_AMBULATORY_CARE_PROVIDER_SITE_OTHER): Payer: Medicare Other | Admitting: Physician Assistant

## 2019-04-08 ENCOUNTER — Other Ambulatory Visit: Payer: Self-pay

## 2019-04-08 ENCOUNTER — Encounter: Payer: Self-pay | Admitting: Physician Assistant

## 2019-04-08 VITALS — BP 122/80 | HR 93 | Temp 97.3°F | Ht 61.0 in | Wt 162.6 lb

## 2019-04-08 DIAGNOSIS — E039 Hypothyroidism, unspecified: Secondary | ICD-10-CM | POA: Diagnosis not present

## 2019-04-08 DIAGNOSIS — I700 Atherosclerosis of aorta: Secondary | ICD-10-CM | POA: Diagnosis not present

## 2019-04-08 DIAGNOSIS — Z0001 Encounter for general adult medical examination with abnormal findings: Secondary | ICD-10-CM

## 2019-04-08 DIAGNOSIS — Z79899 Other long term (current) drug therapy: Secondary | ICD-10-CM

## 2019-04-08 DIAGNOSIS — J3481 Nasal mucositis (ulcerative): Secondary | ICD-10-CM

## 2019-04-08 DIAGNOSIS — C3492 Malignant neoplasm of unspecified part of left bronchus or lung: Secondary | ICD-10-CM

## 2019-04-08 DIAGNOSIS — I10 Essential (primary) hypertension: Secondary | ICD-10-CM

## 2019-04-08 DIAGNOSIS — R6889 Other general symptoms and signs: Secondary | ICD-10-CM | POA: Diagnosis not present

## 2019-04-08 DIAGNOSIS — J00 Acute nasopharyngitis [common cold]: Secondary | ICD-10-CM

## 2019-04-08 DIAGNOSIS — Z5111 Encounter for antineoplastic chemotherapy: Secondary | ICD-10-CM

## 2019-04-08 DIAGNOSIS — E782 Mixed hyperlipidemia: Secondary | ICD-10-CM

## 2019-04-08 DIAGNOSIS — E559 Vitamin D deficiency, unspecified: Secondary | ICD-10-CM

## 2019-04-08 DIAGNOSIS — Z0000 Encounter for general adult medical examination without abnormal findings: Secondary | ICD-10-CM

## 2019-04-08 DIAGNOSIS — J3489 Other specified disorders of nose and nasal sinuses: Secondary | ICD-10-CM

## 2019-04-08 MED ORDER — ERYTHROMYCIN 5 MG/GM OP OINT: TOPICAL_OINTMENT | OPHTHALMIC | 1 refills | Status: DC

## 2019-04-08 NOTE — Patient Instructions (Addendum)
Do saline nasal spray at least once or twice a day Do the ointment at night     When it comes to diets, agreement about the perfect plan isn't easy to find, even among the experts. Experts at the [employer] developed an idea known as the Healthy Eating Plate. Just imagine a plate divided into logical, healthy portions.  The emphasis is on diet quality:  Load up on vegetables and fruits - one-half of your plate: Aim for color and variety, and remember that potatoes don't count.  Go for whole grains - one-quarter of your plate: Whole wheat, barley, wheat berries, quinoa, oats, brown rice, and foods made with them. If you want pasta, go with whole wheat pasta.  Protein power - one-quarter of your plate: Fish, chicken, beans, and nuts are all healthy, versatile protein sources. Limit red meat.  The diet, however, does go beyond the plate, offering a few other suggestions.  Use healthy plant oils, such as olive, canola, soy, corn, sunflower and peanut. Check the labels, and avoid partially hydrogenated oil, which have unhealthy trans fats.  If you're thirsty, drink water. Coffee and tea are good in moderation, but skip sugary drinks and limit milk and dairy products to one or two daily servings.  The type of carbohydrate in the diet is more important than the amount. Some sources of carbohydrates, such as vegetables, fruits, whole grains, and beans-are healthier than others.  Finally, stay active.

## 2019-04-09 LAB — COMPLETE METABOLIC PANEL WITH GFR
AG Ratio: 1.4 (calc) (ref 1.0–2.5)
ALT: 20 U/L (ref 6–29)
AST: 20 U/L (ref 10–35)
Albumin: 3.6 g/dL (ref 3.6–5.1)
Alkaline phosphatase (APISO): 57 U/L (ref 37–153)
BUN/Creatinine Ratio: 18 (calc) (ref 6–22)
BUN: 18 mg/dL (ref 7–25)
CO2: 25 mmol/L (ref 20–32)
Calcium: 8.8 mg/dL (ref 8.6–10.4)
Chloride: 106 mmol/L (ref 98–110)
Creat: 1.01 mg/dL — ABNORMAL HIGH (ref 0.60–0.93)
GFR, Est African American: 64 mL/min/{1.73_m2} (ref >=60)
GFR, Est Non African American: 56 mL/min/{1.73_m2} — ABNORMAL LOW (ref >=60)
Globulin: 2.5 g/dL (calc) (ref 1.9–3.7)
Glucose, Bld: 80 mg/dL (ref 65–99)
Potassium: 4.1 mmol/L (ref 3.5–5.3)
Sodium: 141 mmol/L (ref 135–146)
Total Bilirubin: 1.2 mg/dL (ref 0.2–1.2)
Total Protein: 6.1 g/dL (ref 6.1–8.1)

## 2019-04-09 LAB — CBC WITH DIFFERENTIAL/PLATELET
Absolute Monocytes: 488 cells/uL (ref 200–950)
Basophils Absolute: 48 cells/uL (ref 0–200)
Basophils Relative: 0.9 %
Eosinophils Absolute: 122 cells/uL (ref 15–500)
Eosinophils Relative: 2.3 %
HCT: 38 % (ref 35.0–45.0)
Hemoglobin: 12.6 g/dL (ref 11.7–15.5)
Lymphs Abs: 1012 cells/uL (ref 850–3900)
MCH: 27.9 pg (ref 27.0–33.0)
MCHC: 33.2 g/dL (ref 32.0–36.0)
MCV: 84.3 fL (ref 80.0–100.0)
MPV: 12.5 fL (ref 7.5–12.5)
Monocytes Relative: 9.2 %
Neutro Abs: 3631 cells/uL (ref 1500–7800)
Neutrophils Relative %: 68.5 %
Platelets: 180 10*3/uL (ref 140–400)
RBC: 4.51 10*6/uL (ref 3.80–5.10)
RDW: 13.1 % (ref 11.0–15.0)
Total Lymphocyte: 19.1 %
WBC: 5.3 10*3/uL (ref 3.8–10.8)

## 2019-04-09 LAB — LIPID PANEL
Cholesterol: 195 mg/dL (ref <200)
HDL: 49 mg/dL — ABNORMAL LOW (ref >=50)
LDL Cholesterol (Calc): 125 mg/dL (calc) — ABNORMAL HIGH
Non-HDL Cholesterol (Calc): 146 mg/dL (calc) — ABNORMAL HIGH (ref <130)
Total CHOL/HDL Ratio: 4 (calc) (ref <5.0)
Triglycerides: 107 mg/dL (ref <150)

## 2019-04-09 LAB — MAGNESIUM: Magnesium: 1.7 mg/dL (ref 1.5–2.5)

## 2019-04-09 LAB — TSH: TSH: 3.98 mIU/L (ref 0.40–4.50)

## 2019-04-09 LAB — VITAMIN D 25 HYDROXY (VIT D DEFICIENCY, FRACTURES): Vit D, 25-Hydroxy: 79 ng/mL (ref 30–100)

## 2019-04-17 ENCOUNTER — Other Ambulatory Visit: Payer: Self-pay | Admitting: Internal Medicine

## 2019-04-17 DIAGNOSIS — E039 Hypothyroidism, unspecified: Secondary | ICD-10-CM

## 2019-04-17 MED ORDER — LEVOTHYROXINE SODIUM 50 MCG PO TABS: ORAL_TABLET | ORAL | 1 refills | Status: DC

## 2019-05-01 ENCOUNTER — Other Ambulatory Visit: Payer: Self-pay | Admitting: Internal Medicine

## 2019-05-05 MED FILL — GILOTRIF 30 MG TABLET: 30 | 30 days supply | Qty: 30 | Fill #0

## 2019-05-06 ENCOUNTER — Encounter: Payer: Self-pay | Admitting: Adult Health Nurse Practitioner

## 2019-05-06 ENCOUNTER — Other Ambulatory Visit: Payer: Self-pay

## 2019-05-06 ENCOUNTER — Ambulatory Visit (INDEPENDENT_AMBULATORY_CARE_PROVIDER_SITE_OTHER): Payer: Medicare Other | Admitting: Adult Health Nurse Practitioner

## 2019-05-06 VITALS — BP 130/74 | HR 94 | Temp 97.5°F | Ht 61.0 in | Wt 161.6 lb

## 2019-05-06 DIAGNOSIS — C3492 Malignant neoplasm of unspecified part of left bronchus or lung: Secondary | ICD-10-CM | POA: Diagnosis not present

## 2019-05-06 DIAGNOSIS — J00 Acute nasopharyngitis [common cold]: Secondary | ICD-10-CM | POA: Diagnosis not present

## 2019-05-06 DIAGNOSIS — J3489 Other specified disorders of nose and nasal sinuses: Secondary | ICD-10-CM

## 2019-05-06 DIAGNOSIS — H1032 Unspecified acute conjunctivitis, left eye: Secondary | ICD-10-CM | POA: Diagnosis not present

## 2019-05-06 MED ORDER — IPRATROPIUM BROMIDE 0.03 % NA SOLN: 2.0000 | Freq: Three times a day (TID) | NASAL | 3 refills | Status: DC

## 2019-05-06 MED ORDER — POLYMYXIN B-TRIMETHOPRIM 10000-0.1 UNIT/ML-% OP SOLN: 1.0000 [drp] | OPHTHALMIC | 1 refills | Status: DC

## 2019-05-06 NOTE — Progress Notes (Signed)
Assessment and Plan:  Elizabeth [name] was seen today for acute visit and conjunctivitis.  Diagnoses and all orders for this visit:  Acute bacterial conjunctivitis of left eye -     trimethoprim-polymyxin b (POLYTRIM) ophthalmic solution; Place 1 drop into the left eye every 4 (four) hours. For 10 days. Dispense quantity sufficient.  Acute rhinitis -     ipratropium (ATROVENT) 0.03 % nasal spray; Place 2 sprays into the nose 3 (three) times daily.  Adenocarcinoma of left lung, stage 4 ([place]) Follow up oncology, Dr Julien Nordmann Taking [facility]  Internal Nasal Lesion  From [place]? OTC hydrocortisone, small amount via cotton tip applicator to affected areasTID PRN Also has Mupirocin, will re-try, smaller amount Try one treatment at a time.   Contact office with new or worsening symptoms  Further disposition pending results of labs. Discussed med's effects and SE's.   Over 30 minutes of exam, counseling, chart review, and critical decision making was performed.   Future Appointments  Date Time Provider [place]  05/21/2019  9:00 AM [facility] 3 [facility] None  05/21/2019  9:30 AM Curt Bears, MD [facility] None  07/09/2019 10:30 AM Vicie Mutters, PA-C [facility] None  10/08/2019 11:00 AM [name], MD [facility] None  04/13/2020 11:15 AM Vicie Mutters, PA-C [facility] None    ------------------------------------------------------------------------------------------------------------------   HPI 73 y.o.female presents for evaluation of her left eye that started on Sunday with mild irritation and has rapidly progressed.  She reports it feels like there is something in her left upper corner of her lid.  She has flushed this with water and used moisturizing eye drops for this with minimal relief.  She denies itching, exudate or change in vision.    Reports she has nasal lesion that become irritated and eventually scab. She was given Last visit 04/08/19 she was given  erythromycin.   She reports this did not help with her symptoms.  She has also tried saline and Vaseline.  She tried Mupirocin cream and reports that this gave her diarrhea and she thinks she was using too much of it.  Past Medical History:  Diagnosis Date  . Adenocarcinoma of left lung, stage 4 (Elizabeth [name]) 04/05/2015   Biopsy confirmed CT SCAN: There are innumerable bilateral pulmonary nodules. These range in size from about 5 mm to 2 cm. They demonstrate irregular indistinct borders, the larger ones demonstrating spiculation. PET SCAN: Diffuse pulmonary metastatic disease with a 4 cm dominant left lower low lung lesion. No enlarged or hypermetabolic mediastinal or hilar adenopathy.  . Allergy   . Anxiety   . Cancer ([name])   . Change of skin related to chemotherapy 09/25/2017  . Drug-induced skin rash 01/17/2016  . Hyperlipidemia   . Hypertension   . Insomnia   . Prediabetes   . Thyroid disease      Allergies  Allergen Reactions  . Penicillins Swelling and Rash    Current Outpatient Medications on File Prior to Visit  Medication Sig  . ALPRAZolam (XANAX) 1 MG tablet TAKE 1/2 TO 1 TABLET BY MOUTH 2 TO 3 TIMES DAILY ONLY IF NEEDED FOR ANXIETY ATTACKS. LIMIT TO 5 DAYS PER WEEK TO AVOID ADDICTION  . aspirin 81 MG tablet Take 81 mg by mouth at bedtime.   . Cholecalciferol (VITAMIN D3) 10000 UNITS TABS Take 10,000 Units by mouth 3 (three) times a week.   . Cyanocobalamin (VITAMIN B 12 PO) Take 1 tablet by mouth daily.  Marland Kitchen erythromycin ophthalmic ointment Apply small amount QHS to affected area  .  GILOTRIF 30 MG tablet TAKE 1 TABLET (30MG ) BY MOUTH ONCE DAILY. TAKE ON AN EMPTY STOMACH 1 HOUR BEFORE OR 2 HOURS AFTER A MEAL  . HYDROcodone-homatropine (HYCODAN) 5-1.5 MG/5ML syrup Take 5 mLs by mouth every 6 (six) hours as needed for cough.  Marland Kitchen ibuprofen (ADVIL,MOTRIN) 200 MG tablet Take 200 mg by mouth every 6 (six) hours as needed.  Marland Kitchen levothyroxine (SYNTHROID) 50 MCG tablet Take 1 tablet daily on an  empty stomach with only water for 30 minutes & no Antacid meds, Calcium or Magnesium for 4 hours & avoid Biotin  . Melatonin 10 MG CAPS Take 1 capsule by mouth at bedtime as needed.  . montelukast (SINGULAIR) 10 MG tablet Take 1 tablet Daily for Allergies  . diphenoxylate-atropine (LOMOTIL) 2.5-0.025 MG tablet 1 to 2 PO QID prn diarrhea   No current facility-administered medications on file prior to visit.     ROS: all negative except above.   Physical Exam:  BP 130/74   Pulse 94   Temp (!) 97.5 F (36.4 C)   Ht 5\' 1"  (1.549 m)   Wt 161 lb 9.6 oz (73.3 kg)   SpO2 98%   BMI 30.53 kg/m   General Appearance: Well nourished, in no apparent distress. Eyes: PERRLA, EOMs, conjunctiva pink, erythema, Small white head noted to upper inner aspect of lid approx 0.25cm from margin. Sinuses: No Frontal/maxillary tenderness ENT/Mouth: Ext aud canals clear, TMs without erythema, bulging. No erythema, swelling, or exudate on post pharynx.  Tonsils not swollen or erythematous. Hearing normal.  Neck: Supple, thyroid normal.  Respiratory: Respiratory effort normal, BS equal bilaterally without rales, rhonchi, wheezing or stridor.  Cardio: RRR with no MRGs. Brisk peripheral pulses without edema.  Abdomen: Soft, + BS.  Non tender, no guarding, rebound, hernias, masses. Lymphatics: Non tender without lymphadenopathy.  Musculoskeletal: Full ROM, 5/5 strength, normal gait.  Skin: Warm, dry without rashes, lesions, ecchymosis.  Neuro: Cranial nerves intact. Normal muscle tone, no cerebellar symptoms. Sensation intact.  Psych: Awake and oriented X 3, normal affect, Insight and Judgment appropriate.     [name], NP 2:14 PM [facility]

## 2019-05-06 NOTE — Patient Instructions (Addendum)
Apply warm moist wash cloth to left eye at least 4 times a day.  Use a new cloth each time.   We will send in Polymixin eye drops. Use 2 drops in affected eye four times a day for 10 days.  Monitor right eye for symptoms.    We will send in a refill for Atrovent nasal spray. Try OTC Cortisone cream in nares 2-3 times a day.       Stye  A stye, also known as a hordeolum, is a bump that forms on an eyelid. It may look like a pimple next to the eyelash. A stye can form inside the eyelid (internal stye) or outside the eyelid (external stye). A stye can cause redness, swelling, and pain on the eyelid. Styes are very common. Anyone can get them at any age. They usually occur in just one eye, but you may have more than one in either eye. What are the causes? A stye is caused by an infection. The infection is almost always caused by bacteria called Staphylococcus aureus. This is a common type of bacteria that lives on the skin. An internal stye may result from an infected oil-producing gland inside the eyelid. An external stye may be caused by an infection at the base of the eyelash (hair follicle). What increases the risk? You are more likely to develop a stye if:  You have had a stye before.  You have any of these conditions: ? Diabetes. ? Red, itchy, inflamed eyelids (blepharitis). ? A skin condition such as seborrheic dermatitis or rosacea. ? High fat levels in your blood (lipids). What are the signs or symptoms? The most common symptom of a stye is eyelid pain. Internal styes are more painful than external styes. Other symptoms may include:  Painful swelling of your eyelid.  A scratchy feeling in your eye.  Tearing and redness of your eye.  Pus draining from the stye. How is this diagnosed? Your health care provider may be able to diagnose a stye just by examining your eye. The health care provider may also check to make sure:  You do not have a fever or other signs of a more  serious infection.  The infection has not spread to other parts of your eye or areas around your eye. How is this treated? Most styes will clear up in a few days without treatment or with warm compresses applied to the area. You may need to use antibiotic drops or ointment to treat an infection. In some cases, if your stye does not heal with routine treatment, your health care provider may drain pus from the stye using a thin blade or needle. This may be done if the stye is large, causing a lot of pain, or affecting your vision. Follow these instructions at home:  Take over-the-counter and prescription medicines only as told by your health care provider. This includes eye drops or ointments.  If you were prescribed an antibiotic medicine, apply or use it as told by your health care provider. Do not stop using the antibiotic even if your condition improves.  Apply a warm, wet cloth (warm compress) to your eye for 5-10 minutes, 4 times a day.  Clean the affected eyelid as directed by your health care provider.  Do not wear contact lenses or eye makeup until your stye has healed.  Do not try to pop or drain the stye.  Do not rub your eye. Contact a health care provider if:  You have chills  or a fever.  Your stye does not go away after several days.  Your stye affects your vision.  Your eyeball becomes swollen, red, or painful. Get help right away if:  You have pain when moving your eye around. Summary  A stye is a bump that forms on an eyelid. It may look like a pimple next to the eyelash.  A stye can form inside the eyelid (internal stye) or outside the eyelid (external stye). A stye can cause redness, swelling, and pain on the eyelid.  Your health care provider may be able to diagnose a stye just by examining your eye.  Apply a warm, wet cloth (warm compress) to your eye for 5-10 minutes, 4 times a day. This information is not intended to replace advice given to you by your  health care provider. Make sure you discuss any questions you have with your health care provider. Document Released: 07/19/2005 Document Revised: 09/21/2017 Document Reviewed: 06/21/2017 Elsevier Patient Education  2020 [employer].

## 2019-05-21 ENCOUNTER — Inpatient Hospital Stay: Payer: Medicare Other | Attending: Internal Medicine | Admitting: Internal Medicine

## 2019-05-21 ENCOUNTER — Telehealth: Payer: Self-pay | Admitting: Internal Medicine

## 2019-05-21 ENCOUNTER — Inpatient Hospital Stay: Payer: Medicare Other

## 2019-05-21 ENCOUNTER — Other Ambulatory Visit: Payer: Self-pay

## 2019-05-21 ENCOUNTER — Encounter: Payer: Self-pay | Admitting: Internal Medicine

## 2019-05-21 VITALS — BP 130/77 | HR 78 | Temp 97.8°F | Resp 18 | Ht 61.0 in | Wt 160.6 lb

## 2019-05-21 DIAGNOSIS — Z7982 Long term (current) use of aspirin: Secondary | ICD-10-CM | POA: Diagnosis not present

## 2019-05-21 DIAGNOSIS — Z5111 Encounter for antineoplastic chemotherapy: Secondary | ICD-10-CM

## 2019-05-21 DIAGNOSIS — Z79899 Other long term (current) drug therapy: Secondary | ICD-10-CM

## 2019-05-21 DIAGNOSIS — C3492 Malignant neoplasm of unspecified part of left bronchus or lung: Secondary | ICD-10-CM

## 2019-05-21 DIAGNOSIS — Z791 Long term (current) use of non-steroidal anti-inflammatories (NSAID): Secondary | ICD-10-CM | POA: Diagnosis not present

## 2019-05-21 DIAGNOSIS — F419 Anxiety disorder, unspecified: Secondary | ICD-10-CM | POA: Diagnosis not present

## 2019-05-21 DIAGNOSIS — C3490 Malignant neoplasm of unspecified part of unspecified bronchus or lung: Secondary | ICD-10-CM

## 2019-05-21 DIAGNOSIS — C3432 Malignant neoplasm of lower lobe, left bronchus or lung: Secondary | ICD-10-CM | POA: Insufficient documentation

## 2019-05-21 LAB — CBC WITH DIFFERENTIAL (CANCER CENTER ONLY)
Abs Immature Granulocytes: 0.02 10*3/uL (ref 0.00–0.07)
Basophils Absolute: 0.1 10*3/uL (ref 0.0–0.1)
Basophils Relative: 1 %
Eosinophils Absolute: 0.2 10*3/uL (ref 0.0–0.5)
Eosinophils Relative: 3 %
HCT: 38.3 % (ref 36.0–46.0)
Hemoglobin: 12.2 g/dL (ref 12.0–15.0)
Immature Granulocytes: 0 %
Lymphocytes Relative: 18 %
Lymphs Abs: 1.2 10*3/uL (ref 0.7–4.0)
MCH: 27.7 pg (ref 26.0–34.0)
MCHC: 31.9 g/dL (ref 30.0–36.0)
MCV: 86.8 fL (ref 80.0–100.0)
Monocytes Absolute: 0.7 10*3/uL (ref 0.1–1.0)
Monocytes Relative: 10 %
Neutro Abs: 4.6 10*3/uL (ref 1.7–7.7)
Neutrophils Relative %: 68 %
Platelet Count: 176 10*3/uL (ref 150–400)
RBC: 4.41 MIL/uL (ref 3.87–5.11)
RDW: 13 % (ref 11.5–15.5)
WBC Count: 6.7 10*3/uL (ref 4.0–10.5)
nRBC: 0 % (ref 0.0–0.2)

## 2019-05-21 LAB — CMP (CANCER CENTER ONLY)
ALT: 17 U/L (ref 0–44)
AST: 18 U/L (ref 15–41)
Albumin: 3.5 g/dL (ref 3.5–5.0)
Alkaline Phosphatase: 61 U/L (ref 38–126)
Anion gap: 8 (ref 5–15)
BUN: 18 mg/dL (ref 8–23)
CO2: 25 mmol/L (ref 22–32)
Calcium: 8.9 mg/dL (ref 8.9–10.3)
Chloride: 107 mmol/L (ref 98–111)
Creatinine: 1.01 mg/dL — ABNORMAL HIGH (ref 0.44–1.00)
GFR, Est AFR Am: >60 mL/min (ref >60)
GFR, Estimated: 56 mL/min — ABNORMAL LOW (ref >60)
Glucose, Bld: 89 mg/dL (ref 70–99)
Potassium: 4.3 mmol/L (ref 3.5–5.1)
Sodium: 140 mmol/L (ref 135–145)
Total Bilirubin: 0.6 mg/dL (ref 0.3–1.2)
Total Protein: 6.7 g/dL (ref 6.5–8.1)

## 2019-05-21 NOTE — Progress Notes (Signed)
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  CURRENT THERAPY: Gilotrif 30 mg by mouth daily started 02/21/2016 status post 38 months of treatment.  INTERVAL HISTORY: Elizabeth [name] 73 y.o. female returns to the clinic today for follow-up visit.  The patient is feeling fine today with no concerning complaints.  She denied having any chest pain, shortness of breath, cough or hemoptysis.  She denied having any fever or chills.  She has no nausea, vomiting, diarrhea or constipation.  She continues to tolerate her treatment with Gilotrif fairly well.  The patient is here today for evaluation and repeat blood work.  MEDICAL HISTORY: Past Medical History:  Diagnosis Date  . Adenocarcinoma of left lung, stage 4 ([place]) 04/05/2015   Biopsy confirmed CT SCAN: There are innumerable bilateral pulmonary nodules. These range in size from about 5 mm to 2 cm. They demonstrate irregular indistinct borders, the larger ones demonstrating spiculation. PET SCAN: Diffuse pulmonary metastatic disease with a 4 cm dominant left lower low lung lesion. No enlarged or hypermetabolic mediastinal or hilar adenopathy.  . Allergy   . Anxiety   . Cancer ([place])   . Change of skin related to chemotherapy 09/25/2017  . Drug-induced skin rash 01/17/2016  . Hyperlipidemia   . Hypertension   . Insomnia   . Prediabetes   . Thyroid disease     ALLERGIES:  is allergic to penicillins.   MEDICATIONS:  Current Outpatient Medications  Medication Sig Dispense Refill  . ALPRAZolam (XANAX) 1 MG tablet TAKE 1/2 TO 1 TABLET BY MOUTH 2 TO 3 TIMES DAILY ONLY IF NEEDED FOR ANXIETY ATTACKS. LIMIT TO 5 DAYS PER WEEK TO AVOID ADDICTION 90 tablet 0  . aspirin 81 MG tablet Take 81 mg by mouth at bedtime.     . Cholecalciferol (VITAMIN D3) 10000 UNITS TABS Take 10,000 Units by mouth 3 (three) times a week.     . Cyanocobalamin (VITAMIN B 12 PO) Take 1 tablet by mouth daily.    . diphenoxylate-atropine (LOMOTIL) 2.5-0.025 MG tablet 1 to 2 PO QID prn diarrhea 40 tablet 2  . GILOTRIF 30 MG tablet TAKE 1 TABLET (30MG) BY MOUTH ONCE DAILY. TAKE ON AN EMPTY STOMACH 1 HOUR BEFORE OR 2 HOURS AFTER A MEAL 30 tablet 2  . HYDROcodone-homatropine (HYCODAN) 5-1.5 MG/5ML syrup Take 5 mLs by mouth every 6 (six) hours as needed for cough. 120 mL 0  . ibuprofen (ADVIL,MOTRIN) 200 MG tablet Take 200 mg by mouth every 6 (six) hours as needed.    Marland Kitchen levothyroxine (SYNTHROID) 50 MCG tablet Take 1 tablet daily on an empty stomach with only water for 30 minutes & no Antacid meds, Calcium or Magnesium for 4 hours & avoid Biotin 90 tablet 1  . Melatonin 10 MG CAPS Take 1 capsule by mouth at bedtime as needed.    . montelukast (SINGULAIR) 10 MG tablet Take 1 tablet Daily for Allergies 90 tablet 3   No current facility-administered medications for this visit.  SURGICAL HISTORY:  Past Surgical History:  Procedure Laterality Date  . ABDOMINAL HYSTERECTOMY  1995   w BSO  . TONSILLECTOMY AND ADENOIDECTOMY      REVIEW OF SYSTEMS:  A comprehensive review of systems was negative.   PHYSICAL EXAMINATION: General appearance: alert, cooperative and no distress Head: Normocephalic, without obvious abnormality, atraumatic Neck: no adenopathy, no JVD, supple, symmetrical, trachea midline and thyroid not enlarged, symmetric, no tenderness/mass/nodules Lymph nodes: Cervical, supraclavicular, and axillary nodes normal.  Resp: clear to auscultation bilaterally Back: symmetric, no curvature. ROM normal. No CVA tenderness. Cardio: regular rate and rhythm, S1, S2 normal, no murmur, click, rub or gallop GI: soft, non-tender; bowel sounds normal; no masses,  no organomegaly Extremities: extremities normal, atraumatic, no cyanosis or edema  ECOG PERFORMANCE STATUS: 0 - Asymptomatic  Blood pressure 130/77, pulse 78, temperature 97.8 F (36.6 C), temperature source Oral, resp. rate 18, height _0  (1.549 m), weight 160 lb 9.6 oz (72.8 kg), SpO2 97 %.  LABORATORY DATA: Lab Results  Component Value Date   WBC 6.7 05/21/2019   HGB 12.2 05/21/2019   HCT 38.3 05/21/2019   MCV 86.8 05/21/2019   PLT 176 05/21/2019      Chemistry      Component Value Date/Time   NA 141 04/08/2019 1155   NA 139 10/26/2017 1252   K 4.1 04/08/2019 1155   K 4.1 10/26/2017 1252   CL 106 04/08/2019 1155   CO2 25 04/08/2019 1155   CO2 28 10/26/2017 1252   BUN 18 04/08/2019 1155   BUN 15.7 10/26/2017 1252   CREATININE 1.01 (H) 04/08/2019 1155   CREATININE 1.0 10/26/2017 1252      Component Value Date/Time   CALCIUM 8.8 04/08/2019 1155   CALCIUM 8.8 10/26/2017 1252   ALKPHOS 63 03/21/2019 0918   ALKPHOS 70 10/26/2017 1252   AST 20 04/08/2019 1155   AST 19 03/21/2019 0918   AST 19 10/26/2017 1252   ALT 20 04/08/2019 1155   ALT 16 03/21/2019 0918   ALT 22 10/26/2017 1252   BILITOT 1.2 04/08/2019 1155   BILITOT 0.9 03/21/2019 0918   BILITOT 0.90 10/26/2017 1252       RADIOGRAPHIC STUDIES: No results found.  ASSESSMENT AND PLAN:  This is a very pleasant 73 years old white female with stage IV non-small cell lung cancer, adenocarcinoma with positive EGFR mutation with deletion in exon 19 and currently undergoing treatment with Gilotrif initially at a dose of 40 mg for 9 months followed by 30 mg by mouth daily status post 38 months.  The patient continues to tolerate this treatment well with no concerning adverse  effects. I recommended for her to continue her current treatment with Gilotrif with the same dose. She will come back for follow-up visit in 2 months for evaluation with repeat CT scan of the chest, abdomen and pelvis for restaging of her disease. She was advised to call immediately if she has any concerning symptoms in the interval. The patient voices understanding of current disease status and treatment options and is in agreement with the current care plan. All questions were answered. The patient knows to call the clinic with any problems, questions or concerns. We can certainly see the patient much sooner if necessary.  Disclaimer: This note was dictated with voice recognition software. Similar sounding words can inadvertently be transcribed and may not be corrected upon review.

## 2019-05-21 NOTE — Telephone Encounter (Signed)
Scheduled appt per 7/29 los - reminder letter mailed with appt date and time

## 2019-05-30 ENCOUNTER — Other Ambulatory Visit: Payer: Self-pay | Admitting: Physician Assistant

## 2019-06-03 MED FILL — GILOTRIF 30 MG TABLET: 30 | 30 days supply | Qty: 30 | Fill #1

## 2019-06-27 ENCOUNTER — Telehealth: Payer: Self-pay | Admitting: *Deleted

## 2019-06-27 NOTE — Telephone Encounter (Signed)
[facility] at [address] faxed Prior authorization request for Diphenoxylate-Atropine 2.5-0.025 MG Tabs.  Request to [facility] letter tray receptacle of Prior Authorization forms for review.

## 2019-07-07 MED FILL — GILOTRIF 30 MG TABLET: 30 | 30 days supply | Qty: 30 | Fill #2

## 2019-07-08 NOTE — Progress Notes (Signed)
Follow up  Assessment and Plan: SOB No chest pain, no wheezing, no accompaniments Increase exercise Will send in albuterol Follow up for CT Go to the ER if any chest pain, shortness of breath, nausea, dizziness, severe HA, changes vision/speech   Essential hypertension - continue medications, DASH diet, exercise and monitor at home. Call if greater than 130/80.  -     CBC with Differential/Platelet -     BASIC METABOLIC PANEL WITH GFR -     Hepatic function panel -     TSH  Aortic atherosclerosis (HCC) Control blood pressure, cholesterol, glucose, increase exercise.  -     Lipid panel  Adenocarcinoma of left lung, stage 4 (HCC) Follow up oncology  Acquired hypothyroidism -     TSH Hypothyroidism-check TSH level, continue medications the same, reminded to take on an empty stomach 30-24mins before food.   Hyperlipidemia -     Lipid panel -continue medications, check lipids, decrease fatty foods, increase activity.   Medication management -     Magnesium  Vitamin D deficiency -     VITAMIN D 25 Hydroxy (Vit-D Deficiency, Fractures)  Overweight - long discussion about weight loss, diet, and exercise -recommended diet heavy in fruits and veggies and low in animal meats, cheeses, and dairy products  Discussed med's effects and SE's. Screening labs and tests as requested with regular follow-up as recommended. Future Appointments  Date Time Provider [place]  07/18/2019  9:00 AM [facility] 4 [facility] None  07/18/2019 10:00 AM [facility] 2 [facility] [place]  07/21/2019  9:45 AM Curt Bears, MD [facility] None  10/08/2019 11:00 AM [name], MD [facility] None  04/13/2020 11:15 AM Vicie Mutters, PA-C [facility] None    HPI  73 y.o. female  presents for medicare wellness and follow up  Her blood pressure has been controlled at home, today their BP is BP: 118/74.  She does not workout. She denies chest pain,  dizziness.   States recently, while  walking up a hill she had SOB, recovered after a few mins. No CP, no sweating, no nausea, dizziness. Has not been exercising.   BMI is Body mass index is 30.8 kg/m. Wt Readings from Last 8 Encounters:  07/09/19 163 lb (73.9 kg)  05/21/19 160 lb 9.6 oz (72.8 kg)  05/06/19 161 lb 9.6 oz (73.3 kg)  04/08/19 162 lb 9.6 oz (73.8 kg)  03/24/19 160 lb 11.2 oz (72.9 kg)  01/22/19 159 lb 4.8 oz (72.3 kg)  11/21/18 159 lb 1.6 oz (72.2 kg)  11/04/18 156 lb 4.8 oz (70.9 kg)   She has history of stage 4 lung cancer, on Glotinef maintenance therapy 30 mg and following with Dr. Julien Nordmann. Getting repeat CT soon.    Follows with Dr. Latanya Maudlin for OA.   She is on cholesterol medication and denies myalgias. Her cholesterol is at goal. The cholesterol last visit was:  Lab Results  Component Value Date   CHOL 195 04/08/2019   HDL 49 (L) 04/08/2019   LDLCALC 125 (H) 04/08/2019   TRIG 107 04/08/2019   CHOLHDL 4.0 04/08/2019  .  Last A1C in the office was:  Lab Results  Component Value Date   HGBA1C 5.4 03/27/2018   Patient is on Vitamin D supplement.   Lab Results  Component Value Date   VD25OH 79 04/08/2019     She is on thyroid medication. Her medication was not changed last visit.She is not on biotin.   Lab Results  Component Value Date  TSH 3.98 04/08/2019  .   Current Medications:   Current Outpatient Medications (Endocrine & Metabolic):  .  levothyroxine (SYNTHROID) 50 MCG tablet, Take 1 tablet daily on an empty stomach with only water for 30 minutes & no Antacid meds, Calcium or Magnesium for 4 hours & avoid Biotin   Current Outpatient Medications (Respiratory):  .  montelukast (SINGULAIR) 10 MG tablet, Take 1 tablet Daily for Allergies .  HYDROcodone-homatropine (HYCODAN) 5-1.5 MG/5ML syrup, Take 5 mLs by mouth every 6 (six) hours as needed for cough. (Patient not taking: Reported on 07/09/2019)  Current Outpatient Medications (Analgesics):  .  aspirin 81 MG tablet, Take 81 mg by  mouth at bedtime.  Marland Kitchen  ibuprofen (ADVIL,MOTRIN) 200 MG tablet, Take 200 mg by mouth every 6 (six) hours as needed.  Current Outpatient Medications (Hematological):  Marland Kitchen  Cyanocobalamin (VITAMIN B 12 PO), Take 1 tablet by mouth daily.  Current Outpatient Medications (Other):  Marland Kitchen  ALPRAZolam (XANAX) 1 MG tablet, Take 1/2-1 tablet 2 - 3 x /day ONLY if needed for Anxiety Attack &  limit to 5 days /week to avoid addiction .  Cholecalciferol (VITAMIN D3) 10000 UNITS TABS, Take 10,000 Units by mouth 3 (three) times a week.  Marland Kitchen  GILOTRIF 30 MG tablet, TAKE 1 TABLET (30MG ) BY MOUTH ONCE DAILY. TAKE ON AN EMPTY STOMACH 1 HOUR BEFORE OR 2 HOURS AFTER A MEAL .  Melatonin 10 MG CAPS, Take 1 capsule by mouth at bedtime as needed. .  diphenoxylate-atropine (LOMOTIL) 2.5-0.025 MG tablet, 1 to 2 PO QID prn diarrhea  Medical History:  Past Medical History:  Diagnosis Date  . Adenocarcinoma of left lung, stage 4 ([place]) 04/05/2015   Biopsy confirmed CT SCAN: There are innumerable bilateral pulmonary nodules. These range in size from about 5 mm to 2 cm. They demonstrate irregular indistinct borders, the larger ones demonstrating spiculation. PET SCAN: Diffuse pulmonary metastatic disease with a 4 cm dominant left lower low lung lesion. No enlarged or hypermetabolic mediastinal or hilar adenopathy.  . Allergy   . Anxiety   . Cancer ([place])   . Change of skin related to chemotherapy 09/25/2017  . Drug-induced skin rash 01/17/2016  . Hyperlipidemia   . Hypertension   . Insomnia   . Prediabetes   . Thyroid disease    Allergies Allergies  Allergen Reactions  . Penicillins Swelling and Rash   SURGICAL HISTORY She  has a past surgical history that includes Tonsillectomy and adenoidectomy and Abdominal hysterectomy (1995). FAMILY HISTORY Her family history includes ALS in her father; Alcohol abuse in her mother and paternal grandfather; Bone cancer in her maternal uncle; Breast cancer in her cousin; Dementia (age of  onset: 57) in her paternal aunt; Dementia (age of onset: 18) in her maternal grandmother; Heart disease in her paternal grandfather; Hypertension in her father; Liver disease in her mother; Lung cancer (age of onset: 66) in her paternal grandmother; Lung disease in her maternal grandfather; Melanoma in her paternal aunt. SOCIAL HISTORY She  reports that she quit smoking about 44 years ago. She has a 5.00 pack-year smoking history. She has never used smokeless tobacco. She reports that she does not drink alcohol or use drugs.    Review of Systems: Review of Systems  Constitutional: Negative for chills, fever and malaise/fatigue.  HENT: Negative for congestion, ear pain and sore throat.   Eyes: Negative.   Respiratory: Positive for shortness of breath. Negative for cough and wheezing.   Cardiovascular: Negative for chest pain,  palpitations and leg swelling.  Gastrointestinal: Negative for abdominal pain, blood in stool, constipation, diarrhea, heartburn and melena.  Genitourinary: Negative.   Skin: Negative for itching and rash.  Neurological: Negative for dizziness, sensory change, loss of consciousness and headaches.  Psychiatric/Behavioral: Negative for depression. The patient is not nervous/anxious and does not have insomnia.     Physical Exam: Estimated body mass index is 30.8 kg/m as calculated from the following:   Height as of 05/21/19: 5\' 1"  (1.549 m).   Weight as of this encounter: 163 lb (73.9 kg). BP 118/74   Pulse 77   Temp (!) 95.7 F (35.4 C)   Wt 163 lb (73.9 kg)   SpO2 98%   BMI 30.80 kg/m   General Appearance: Well nourished well developed, in no apparent distress.  Eyes: PERRLA, EOMs, conjunctiva no swelling or erythema ENT/Mouth: Ear canals normal without obstruction, swelling, erythema, or discharge.  TMs normal bilaterally with no erythema, bulging, retraction, or loss of landmark.  Oropharynx moist and clear with no exudate, erythema, or swelling.   Neck:  Supple, thyroid normal. No bruits.  No cervical adenopathy Respiratory: Respiratory effort normal, Breath sounds clear A&P without wheeze, rhonchi, rales.   Cardio: RRR without murmurs, rubs or gallops. Brisk peripheral pulses without edema.  Chest: symmetric, with normal excursions Breasts: Symmetric, without lumps, nipple discharge, retractions.  Abdomen: Soft, nontender, no guarding, rebound, hernias, masses, or organomegaly.  Lymphatics: Non tender without lymphadenopathy.  Musculoskeletal: Full ROM all peripheral extremities,5/5 strength, and normal gait.  Skin:  Non-tender, warm and dry Neuro: Awake and oriented X 3, Cranial nerves intact, reflexes equal bilaterally. Normal muscle tone, no cerebellar symptoms. Sensation intact.  Psych:  normal affect, Insight and Judgment appropriate.     Vicie Mutters 10:40 AM [facility]

## 2019-07-09 ENCOUNTER — Other Ambulatory Visit: Payer: Self-pay

## 2019-07-09 ENCOUNTER — Encounter: Payer: Self-pay | Admitting: Physician Assistant

## 2019-07-09 ENCOUNTER — Ambulatory Visit (INDEPENDENT_AMBULATORY_CARE_PROVIDER_SITE_OTHER): Payer: Medicare Other | Admitting: Physician Assistant

## 2019-07-09 VITALS — BP 118/74 | HR 77 | Temp 95.7°F | Wt 163.0 lb

## 2019-07-09 DIAGNOSIS — E782 Mixed hyperlipidemia: Secondary | ICD-10-CM

## 2019-07-09 DIAGNOSIS — I10 Essential (primary) hypertension: Secondary | ICD-10-CM | POA: Diagnosis not present

## 2019-07-09 DIAGNOSIS — C3492 Malignant neoplasm of unspecified part of left bronchus or lung: Secondary | ICD-10-CM | POA: Diagnosis not present

## 2019-07-09 DIAGNOSIS — R7309 Other abnormal glucose: Secondary | ICD-10-CM

## 2019-07-09 DIAGNOSIS — Z23 Encounter for immunization: Secondary | ICD-10-CM

## 2019-07-09 DIAGNOSIS — E559 Vitamin D deficiency, unspecified: Secondary | ICD-10-CM

## 2019-07-09 DIAGNOSIS — Z79899 Other long term (current) drug therapy: Secondary | ICD-10-CM

## 2019-07-09 DIAGNOSIS — I700 Atherosclerosis of aorta: Secondary | ICD-10-CM

## 2019-07-09 DIAGNOSIS — E039 Hypothyroidism, unspecified: Secondary | ICD-10-CM

## 2019-07-09 MED ORDER — ALBUTEROL SULFATE HFA 108 (90 BASE) MCG/ACT IN AERS: 2.0000 | INHALATION_SPRAY | RESPIRATORY_TRACT | 1 refills | Status: DC | PRN

## 2019-07-09 NOTE — Patient Instructions (Signed)
EXERCISE IS MEDICINE!  Benefits of Exercise Increase exercise, try to get heart rate up Can do albuterol as needed  Treats depression as well as medication or cognitive behavioral therapy   WOMEN AND HEART ATTACKS  Being a woman you may not have the typical symptoms of a heart attack.  You may not have any pain OR you may have atypical pain such as jaw pain, upper back pain, arm pain, "my bra feels to tight" and you will often have symptoms with it like below.  Symptoms for a heart attack will likely occur when you exert your self or exercise and include: Shortness of breath Sweating Nausea Dizziness Fast or irregular heart beats Fatigue   It makes me feel better if my patients get their heart rate up with exercise once or twice a week and pay close attention to your body. If there is ANY change in your exercise capacity or if you have symptoms above, please STOP and call 911 or call to come to the office.   Here is some information to help you keep your heart healthy: Move it! - Aim for 30 mins of activity every day. Take it slowly at first. Talk to [place] before starting any new exercise program.   Lose it.  -Body Mass Index (BMI) can indicate if you need to lose weight. A healthy range is 18.5-24.9. For a BMI calculator, go to [employer]  Waist Management -Excess abdominal fat is a risk factor for heart disease, diabetes, asthma, stroke and more. Ideal waist circumference is less than 35" for women and less than 40" for men.   Eat Right -focus on fruits, vegetables, whole grains, and meals you make yourself. Avoid foods with trans fat and high sugar/sodium content.   Snooze or Snore? - Loud snoring can be a sign of sleep apnea, a significant risk factor for high blood pressure, heart attach, stroke, and heart arrhythmias.  Kick the habit -Quit Smoking! Avoid second hand smoke. A single cigarette raises your blood pressure for 20 mins and increases the risk of heart attack  and stroke for the next 24 hours.   Are Aspirin and Supplements right for you? -Add ENTERIC COATED low dose 81 mg Aspirin daily OR can do every other day if you have easy bruising to protect your heart and head. As well as to reduce risk of Colon Cancer by 20 %, Skin Cancer by 26 % , Melanoma by 46% and Pancreatic cancer by 60%  Say "No to Stress -There may be little you can do about problems that cause stress. However, techniques such as long walks, meditation, and exercise can help you manage it.   Start Now! - Make changes one at a time and set reasonable goals to increase your likelihood of success.

## 2019-07-09 NOTE — Progress Notes (Signed)
Patient presents to the office for HD Flu Vaccine. Vaccine administered toLeftDeltoid withoutanycomplication.

## 2019-07-10 ENCOUNTER — Ambulatory Visit: Payer: Medicare Other | Admitting: Physician Assistant

## 2019-07-10 LAB — CBC WITH DIFFERENTIAL/PLATELET
Absolute Monocytes: 449 cells/uL (ref 200–950)
Basophils Absolute: 51 cells/uL (ref 0–200)
Basophils Relative: 1 %
Eosinophils Absolute: 158 cells/uL (ref 15–500)
Eosinophils Relative: 3.1 %
HCT: 38.6 % (ref 35.0–45.0)
Hemoglobin: 12.6 g/dL (ref 11.7–15.5)
Lymphs Abs: 1071 cells/uL (ref 850–3900)
MCH: 27.9 pg (ref 27.0–33.0)
MCHC: 32.6 g/dL (ref 32.0–36.0)
MCV: 85.6 fL (ref 80.0–100.0)
MPV: 12.4 fL (ref 7.5–12.5)
Monocytes Relative: 8.8 %
Neutro Abs: 3371 cells/uL (ref 1500–7800)
Neutrophils Relative %: 66.1 %
Platelets: 176 10*3/uL (ref 140–400)
RBC: 4.51 10*6/uL (ref 3.80–5.10)
RDW: 13.1 % (ref 11.0–15.0)
Total Lymphocyte: 21 %
WBC: 5.1 10*3/uL (ref 3.8–10.8)

## 2019-07-10 LAB — COMPLETE METABOLIC PANEL WITH GFR
AG Ratio: 1.6 (calc) (ref 1.0–2.5)
ALT: 18 U/L (ref 6–29)
AST: 19 U/L (ref 10–35)
Albumin: 3.9 g/dL (ref 3.6–5.1)
Alkaline phosphatase (APISO): 62 U/L (ref 37–153)
BUN/Creatinine Ratio: 18 (calc) (ref 6–22)
BUN: 17 mg/dL (ref 7–25)
CO2: 30 mmol/L (ref 20–32)
Calcium: 9 mg/dL (ref 8.6–10.4)
Chloride: 105 mmol/L (ref 98–110)
Creat: 0.95 mg/dL — ABNORMAL HIGH (ref 0.60–0.93)
GFR, Est African American: 69 mL/min/{1.73_m2} (ref >=60)
GFR, Est Non African American: 60 mL/min/{1.73_m2} (ref >=60)
Globulin: 2.4 g/dL (calc) (ref 1.9–3.7)
Glucose, Bld: 81 mg/dL (ref 65–99)
Potassium: 4.4 mmol/L (ref 3.5–5.3)
Sodium: 140 mmol/L (ref 135–146)
Total Bilirubin: 0.8 mg/dL (ref 0.2–1.2)
Total Protein: 6.3 g/dL (ref 6.1–8.1)

## 2019-07-10 LAB — LIPID PANEL
Cholesterol: 176 mg/dL (ref <200)
HDL: 50 mg/dL (ref >=50)
LDL Cholesterol (Calc): 110 mg/dL (calc) — ABNORMAL HIGH
Non-HDL Cholesterol (Calc): 126 mg/dL (calc) (ref <130)
Total CHOL/HDL Ratio: 3.5 (calc) (ref <5.0)
Triglycerides: 69 mg/dL (ref <150)

## 2019-07-10 LAB — MAGNESIUM: Magnesium: 1.8 mg/dL (ref 1.5–2.5)

## 2019-07-10 LAB — HEMOGLOBIN A1C
Hgb A1c MFr Bld: 5.5 % of total Hgb (ref <5.7)
Mean Plasma Glucose: 111 (calc)
eAG (mmol/L): 6.2 (calc)

## 2019-07-10 LAB — TSH: TSH: 3.32 mIU/L (ref 0.40–4.50)

## 2019-07-10 LAB — VITAMIN D 25 HYDROXY (VIT D DEFICIENCY, FRACTURES): Vit D, 25-Hydroxy: 77 ng/mL (ref 30–100)

## 2019-07-18 ENCOUNTER — Other Ambulatory Visit: Payer: Self-pay

## 2019-07-18 ENCOUNTER — Inpatient Hospital Stay: Payer: Medicare Other | Attending: Internal Medicine

## 2019-07-18 ENCOUNTER — Ambulatory Visit (HOSPITAL_COMMUNITY)
Admission: RE | Admit: 2019-07-18 | Discharge: 2019-07-18 | Disposition: A | Discharge status: Home / Self Care | Payer: Medicare Other | Source: Ambulatory Visit | Attending: Internal Medicine | Admitting: Internal Medicine

## 2019-07-18 DIAGNOSIS — C3490 Malignant neoplasm of unspecified part of unspecified bronchus or lung: Secondary | ICD-10-CM

## 2019-07-18 DIAGNOSIS — R5383 Other fatigue: Secondary | ICD-10-CM | POA: Diagnosis not present

## 2019-07-18 DIAGNOSIS — D1803 Hemangioma of intra-abdominal structures: Secondary | ICD-10-CM | POA: Insufficient documentation

## 2019-07-18 DIAGNOSIS — I10 Essential (primary) hypertension: Secondary | ICD-10-CM | POA: Insufficient documentation

## 2019-07-18 DIAGNOSIS — C3432 Malignant neoplasm of lower lobe, left bronchus or lung: Secondary | ICD-10-CM | POA: Insufficient documentation

## 2019-07-18 DIAGNOSIS — K5730 Diverticulosis of large intestine without perforation or abscess without bleeding: Secondary | ICD-10-CM | POA: Diagnosis not present

## 2019-07-18 DIAGNOSIS — E785 Hyperlipidemia, unspecified: Secondary | ICD-10-CM | POA: Diagnosis not present

## 2019-07-18 DIAGNOSIS — K449 Diaphragmatic hernia without obstruction or gangrene: Secondary | ICD-10-CM | POA: Diagnosis not present

## 2019-07-18 DIAGNOSIS — R911 Solitary pulmonary nodule: Secondary | ICD-10-CM | POA: Insufficient documentation

## 2019-07-18 DIAGNOSIS — Z880 Allergy status to penicillin: Secondary | ICD-10-CM | POA: Insufficient documentation

## 2019-07-18 DIAGNOSIS — I700 Atherosclerosis of aorta: Secondary | ICD-10-CM | POA: Diagnosis not present

## 2019-07-18 DIAGNOSIS — Z79899 Other long term (current) drug therapy: Secondary | ICD-10-CM | POA: Insufficient documentation

## 2019-07-18 DIAGNOSIS — Z90722 Acquired absence of ovaries, bilateral: Secondary | ICD-10-CM | POA: Insufficient documentation

## 2019-07-18 LAB — CBC WITH DIFFERENTIAL (CANCER CENTER ONLY)
Abs Immature Granulocytes: 0.02 10*3/uL (ref 0.00–0.07)
Basophils Absolute: 0.1 10*3/uL (ref 0.0–0.1)
Basophils Relative: 1 %
Eosinophils Absolute: 0.2 10*3/uL (ref 0.0–0.5)
Eosinophils Relative: 4 %
HCT: 38.2 % (ref 36.0–46.0)
Hemoglobin: 12.2 g/dL (ref 12.0–15.0)
Immature Granulocytes: 0 %
Lymphocytes Relative: 23 %
Lymphs Abs: 1.3 10*3/uL (ref 0.7–4.0)
MCH: 27.7 pg (ref 26.0–34.0)
MCHC: 31.9 g/dL (ref 30.0–36.0)
MCV: 86.8 fL (ref 80.0–100.0)
Monocytes Absolute: 0.7 10*3/uL (ref 0.1–1.0)
Monocytes Relative: 11 %
Neutro Abs: 3.5 10*3/uL (ref 1.7–7.7)
Neutrophils Relative %: 61 %
Platelet Count: 179 10*3/uL (ref 150–400)
RBC: 4.4 MIL/uL (ref 3.87–5.11)
RDW: 12.8 % (ref 11.5–15.5)
WBC Count: 5.8 10*3/uL (ref 4.0–10.5)
nRBC: 0 % (ref 0.0–0.2)

## 2019-07-18 LAB — CMP (CANCER CENTER ONLY)
ALT: 21 U/L (ref 0–44)
AST: 21 U/L (ref 15–41)
Albumin: 3.7 g/dL (ref 3.5–5.0)
Alkaline Phosphatase: 73 U/L (ref 38–126)
Anion gap: 8 (ref 5–15)
BUN: 16 mg/dL (ref 8–23)
CO2: 27 mmol/L (ref 22–32)
Calcium: 9 mg/dL (ref 8.9–10.3)
Chloride: 106 mmol/L (ref 98–111)
Creatinine: 1.04 mg/dL — ABNORMAL HIGH (ref 0.44–1.00)
GFR, Est AFR Am: >60 mL/min (ref >60)
GFR, Estimated: 54 mL/min — ABNORMAL LOW (ref >60)
Glucose, Bld: 83 mg/dL (ref 70–99)
Potassium: 4.4 mmol/L (ref 3.5–5.1)
Sodium: 141 mmol/L (ref 135–145)
Total Bilirubin: 0.9 mg/dL (ref 0.3–1.2)
Total Protein: 6.8 g/dL (ref 6.5–8.1)

## 2019-07-18 MED ORDER — IOHEXOL 300 MG/ML  SOLN
100.0000 mL | Freq: Once | INTRAMUSCULAR | Status: AC | PRN
  Administered 2019-07-18: 100 mL via INTRAVENOUS

## 2019-07-18 MED ORDER — SODIUM CHLORIDE (PF) 0.9 % IJ SOLN
INTRAMUSCULAR | Status: AC
  Filled 2019-07-18: qty 50

## 2019-07-20 ENCOUNTER — Other Ambulatory Visit: Payer: Self-pay | Admitting: Internal Medicine

## 2019-07-21 ENCOUNTER — Encounter: Payer: Self-pay | Admitting: Internal Medicine

## 2019-07-21 ENCOUNTER — Other Ambulatory Visit: Payer: Self-pay

## 2019-07-21 ENCOUNTER — Inpatient Hospital Stay (HOSPITAL_BASED_OUTPATIENT_CLINIC_OR_DEPARTMENT_OTHER): Payer: Medicare Other | Admitting: Internal Medicine

## 2019-07-21 VITALS — BP 128/88 | HR 90 | Temp 97.8°F | Resp 16 | Ht 61.0 in | Wt 163.4 lb

## 2019-07-21 DIAGNOSIS — Z5111 Encounter for antineoplastic chemotherapy: Secondary | ICD-10-CM

## 2019-07-21 DIAGNOSIS — C3432 Malignant neoplasm of lower lobe, left bronchus or lung: Secondary | ICD-10-CM | POA: Diagnosis not present

## 2019-07-21 DIAGNOSIS — C3492 Malignant neoplasm of unspecified part of left bronchus or lung: Secondary | ICD-10-CM | POA: Diagnosis not present

## 2019-07-21 DIAGNOSIS — I10 Essential (primary) hypertension: Secondary | ICD-10-CM | POA: Diagnosis not present

## 2019-07-21 DIAGNOSIS — E039 Hypothyroidism, unspecified: Secondary | ICD-10-CM

## 2019-07-21 NOTE — Progress Notes (Signed)
[facility] [phone]   Fax:([phone]  OFFICE PROGRESS NOTE  [name], MD [address] [place] [address]  DIAGNOSIS: Stage IV (T2a, N0, M1a) non-small cell lung cancer, adenocarcinoma with positive EGFR mutation with deletion in exon 19 diagnosed in June 2016 and presented with large mass in the left lower lobe in addition to multiple bilateral pulmonary nodules  PRIOR THERAPY: Gilotrif 40 mg by mouth daily started 05/11/2015, status post 9 months of treatment discontinued on 02/17/2016 secondary to extensive skin rash.  CURRENT THERAPY: Gilotrif 30 mg by mouth daily started 02/21/2016 status post 40 months of treatment.  INTERVAL HISTORY: Elizabeth [name] 73 y.o. female returns to the clinic today for follow-up visit.  The patient is feeling fine today with no concerning complaints except for mild fatigue.  She was a little bit anxious today about her scan results.  She denied having any current chest pain, shortness of breath, cough or hemoptysis.  She denied having any fever or chills.  She has no nausea, vomiting, diarrhea or constipation.  She has no headache or visual changes.  She denied having any significant skin rash.  She had repeat CT scan of the chest, abdomen pelvis performed recently and she is here for evaluation and discussion of her scan results.  MEDICAL HISTORY: Past Medical History:  Diagnosis Date   Adenocarcinoma of left lung, stage 4 ([facility]) 04/05/2015   Biopsy confirmed CT SCAN: There are innumerable bilateral pulmonary nodules. These range in size from about 5 mm to 2 cm. They demonstrate irregular indistinct borders, the larger ones demonstrating spiculation. PET SCAN: Diffuse pulmonary metastatic disease with a 4 cm dominant left lower low lung lesion. No enlarged or hypermetabolic mediastinal or hilar adenopathy.   Allergy    Anxiety    Cancer ([facility])    Change of skin related to chemotherapy  09/25/2017   Drug-induced skin rash 01/17/2016   Hyperlipidemia    Hypertension    Insomnia    Prediabetes    Thyroid disease     ALLERGIES:  is allergic to penicillins.  MEDICATIONS:  Current Outpatient Medications  Medication Sig Dispense Refill   albuterol (VENTOLIN HFA) 108 (90 Base) MCG/ACT inhaler Inhale 2 puffs into the lungs every 4 (four) hours as needed for wheezing or shortness of breath. 18 g 1   ALPRAZolam (XANAX) 1 MG tablet Take 1/2-1 tablet 2 - 3 x /day ONLY if needed for Anxiety Attack &  limit to 5 days /week to avoid addiction 90 tablet 0   aspirin 81 MG tablet Take 81 mg by mouth at bedtime.      Cholecalciferol (VITAMIN D3) 10000 UNITS TABS Take 10,000 Units by mouth 3 (three) times a week.      Cyanocobalamin (VITAMIN B 12 PO) Take 1 tablet by mouth daily.     GILOTRIF 30 MG tablet TAKE 1 TABLET (30MG) BY MOUTH ONCE DAILY. TAKE ON AN EMPTY STOMACH 1 HOUR BEFORE OR 2 HOURS AFTER A MEAL 30 tablet 2   ibuprofen (ADVIL,MOTRIN) 200 MG tablet Take 200 mg by mouth every 6 (six) hours as needed.     ipratropium (ATROVENT) 0.03 % nasal spray U 2 SPRAYS IEN TID     levothyroxine (SYNTHROID) 50 MCG tablet Take 1 tablet daily on an empty stomach with only water for 30 minutes & no Antacid meds, Calcium or Magnesium for 4 hours & avoid Biotin 90 tablet 1   Melatonin 10  MG CAPS Take 1 capsule by mouth at bedtime as needed.     montelukast (SINGULAIR) 10 MG tablet Take 1 tablet Daily for Allergies 90 tablet 3   diphenoxylate-atropine (LOMOTIL) 2.5-0.025 MG tablet 1 to 2 PO QID prn diarrhea 40 tablet 2   HYDROcodone-homatropine (HYCODAN) 5-1.5 MG/5ML syrup Take 5 mLs by mouth every 6 (six) hours as needed for cough. (Patient not taking: Reported on 07/09/2019) 120 mL 0   No current facility-administered medications for this visit.     SURGICAL HISTORY:  Past Surgical History:  Procedure Laterality Date   ABDOMINAL HYSTERECTOMY  1995   w BSO   TONSILLECTOMY  AND ADENOIDECTOMY      REVIEW OF SYSTEMS:  Constitutional: positive for fatigue Eyes: negative Ears, nose, mouth, throat, and face: negative Respiratory: negative Cardiovascular: negative Gastrointestinal: negative Genitourinary:negative Integument/breast: negative Hematologic/lymphatic: negative Musculoskeletal:negative Neurological: negative Behavioral/Psych: negative Endocrine: negative Allergic/Immunologic: negative   PHYSICAL EXAMINATION: General appearance: alert, cooperative and no distress Head: Normocephalic, without obvious abnormality, atraumatic Neck: no adenopathy, no JVD, supple, symmetrical, trachea midline and thyroid not enlarged, symmetric, no tenderness/mass/nodules Lymph nodes: Cervical, supraclavicular, and axillary nodes normal. Resp: clear to auscultation bilaterally Back: symmetric, no curvature. ROM normal. No CVA tenderness. Cardio: regular rate and rhythm, S1, S2 normal, no murmur, click, rub or gallop GI: soft, non-tender; bowel sounds normal; no masses,  no organomegaly Extremities: extremities normal, atraumatic, no cyanosis or edema Neurologic: Alert and oriented X 3, normal strength and tone. Normal symmetric reflexes. Normal coordination and gait  ECOG PERFORMANCE STATUS: 0 - Asymptomatic  Blood pressure 128/88, pulse 90, temperature 97.8 F (36.6 C), temperature source Temporal, resp. rate 16, height _0  (1.549 m), weight 163 lb 6.4 oz (74.1 kg), SpO2 100 %.  LABORATORY DATA: Lab Results  Component Value Date   WBC 5.8 07/18/2019   HGB 12.2 07/18/2019   HCT 38.2 07/18/2019   MCV 86.8 07/18/2019   PLT 179 07/18/2019      Chemistry      Component Value Date/Time   NA 141 07/18/2019 0859   NA 139 10/26/2017 1252   K 4.4 07/18/2019 0859   K 4.1 10/26/2017 1252   CL 106 07/18/2019 0859   CO2 27 07/18/2019 0859   CO2 28 10/26/2017 1252   BUN 16 07/18/2019 0859   BUN 15.7 10/26/2017 1252   CREATININE 1.04 (H) 07/18/2019 0859    CREATININE 0.95 (H) 07/09/2019 1115   CREATININE 1.0 10/26/2017 1252      Component Value Date/Time   CALCIUM 9.0 07/18/2019 0859   CALCIUM 8.8 10/26/2017 1252   ALKPHOS 73 07/18/2019 0859   ALKPHOS 70 10/26/2017 1252   AST 21 07/18/2019 0859   AST 19 10/26/2017 1252   ALT 21 07/18/2019 0859   ALT 22 10/26/2017 1252   BILITOT 0.9 07/18/2019 0859   BILITOT 0.90 10/26/2017 1252       RADIOGRAPHIC STUDIES: Ct Chest W Contrast  Result Date: 07/18/2019 CLINICAL DATA:  Non-small cell lung cancer on systemic therapy, originally diagnosed in 2016. EXAM: CT CHEST, ABDOMEN, AND PELVIS WITH CONTRAST TECHNIQUE: Multidetector CT imaging of the chest, abdomen and pelvis was performed following the standard protocol during bolus administration of intravenous contrast. CONTRAST:  136m OMNIPAQUE IOHEXOL 300 MG/ML  SOLN COMPARISON:  03/21/2019 FINDINGS: CT CHEST FINDINGS Cardiovascular: No signs of pleural or pericardial effusion. Scattered atherosclerosis both calcified and noncalcified. Central pulmonary arteries are unremarkable. Mediastinum/Nodes: No signs of hilar, mediastinal, axillary or supraclavicular lymphadenopathy. Lungs/Pleura: Signs of patchy  ground-glass attenuation throughout the chest along with post treatment changes and infrahilar fibrosis on the left display no change since prior exam. (9 mm right lower lobe lesion previously measured as a "index" lesion not changed.) Perihilar fibrosis not changed in terms of its contour or size measuring 2.2 cm in greatest thickness; however, a small nodule in the left lower lobe has developed slowly over time as compared to May of 2019 currently measuring 9 x 9 mm, previously approximately 5 mm in May of 2019 and 7 mm in May of 2020 best seen on image 74 on today's study, of the thin section lung algorithm series. Left upper lobe nodule just above the distorted fissure is stable at 1 cm. Musculoskeletal: No signs of chest wall mass. CT ABDOMEN PELVIS  FINDINGS Hepatobiliary: Large right hepatic hemangioma is unchanged. No suspicious focal liver finding. Portal vein is patent. No signs of biliary ductal dilation. Pancreas: Unremarkable. No pancreatic ductal dilatation or surrounding inflammatory changes. Spleen: No signs of focal splenic lesion.  Spleen is normal size. Adrenals/Urinary Tract: Adrenal glands are normal. The kidneys enhance symmetrically without suspicious focal lesion or hydronephrosis. Stomach/Bowel: No signs of acute bowel process. Small hiatal hernia. Scattered colonic diverticulosis. Vascular/Lymphatic: No signs of acute vascular process. Scattered atherosclerosis. No signs of retroperitoneal or upper abdominal lymphadenopathy. No signs of pelvic lymphadenopathy. Pelvic vasculature is unremarkable. Reproductive: Post hysterectomy. Other: No signs of abdominal wall hernia. Urinary bladder unremarkable.  No signs of peritoneal disease. Musculoskeletal: Dense sclerosis in the left pubic bone compatible with bone island. No signs of acute bone finding or destructive bone process. Spinal degenerative changes are worse at L5-S1. Grade 1 anterolisthesis of L4 on L5 is similar. IMPRESSION: *Slowly enlarging nodule in the left upper lobe raising the question of disease recurrence. Close attention on follow-up or tissue sampling may be helpful. *Stable post treatment changes in the left lower lobe. *Stable index nodule in the right lower lobe. *No signs of metastatic disease to the abdomen or pelvis. * Aortic Atherosclerosis (ICD10-I70.0). Electronically Signed   By: Zetta Bills M.D.   On: 07/18/2019 12:51   Ct Abdomen Pelvis W Contrast  Result Date: 07/18/2019 CLINICAL DATA:  Non-small cell lung cancer on systemic therapy, originally diagnosed in 2016. EXAM: CT CHEST, ABDOMEN, AND PELVIS WITH CONTRAST TECHNIQUE: Multidetector CT imaging of the chest, abdomen and pelvis was performed following the standard protocol during bolus administration of  intravenous contrast. CONTRAST:  174m OMNIPAQUE IOHEXOL 300 MG/ML  SOLN COMPARISON:  03/21/2019 FINDINGS: CT CHEST FINDINGS Cardiovascular: No signs of pleural or pericardial effusion. Scattered atherosclerosis both calcified and noncalcified. Central pulmonary arteries are unremarkable. Mediastinum/Nodes: No signs of hilar, mediastinal, axillary or supraclavicular lymphadenopathy. Lungs/Pleura: Signs of patchy ground-glass attenuation throughout the chest along with post treatment changes and infrahilar fibrosis on the left display no change since prior exam. (9 mm right lower lobe lesion previously measured as a "index" lesion not changed.) Perihilar fibrosis not changed in terms of its contour or size measuring 2.2 cm in greatest thickness; however, a small nodule in the left lower lobe has developed slowly over time as compared to May of 2019 currently measuring 9 x 9 mm, previously approximately 5 mm in May of 2019 and 7 mm in May of 2020 best seen on image 74 on today's study, of the thin section lung algorithm series. Left upper lobe nodule just above the distorted fissure is stable at 1 cm. Musculoskeletal: No signs of chest wall mass. CT ABDOMEN PELVIS FINDINGS  Hepatobiliary: Large right hepatic hemangioma is unchanged. No suspicious focal liver finding. Portal vein is patent. No signs of biliary ductal dilation. Pancreas: Unremarkable. No pancreatic ductal dilatation or surrounding inflammatory changes. Spleen: No signs of focal splenic lesion.  Spleen is normal size. Adrenals/Urinary Tract: Adrenal glands are normal. The kidneys enhance symmetrically without suspicious focal lesion or hydronephrosis. Stomach/Bowel: No signs of acute bowel process. Small hiatal hernia. Scattered colonic diverticulosis. Vascular/Lymphatic: No signs of acute vascular process. Scattered atherosclerosis. No signs of retroperitoneal or upper abdominal lymphadenopathy. No signs of pelvic lymphadenopathy. Pelvic vasculature is  unremarkable. Reproductive: Post hysterectomy. Other: No signs of abdominal wall hernia. Urinary bladder unremarkable.  No signs of peritoneal disease. Musculoskeletal: Dense sclerosis in the left pubic bone compatible with bone island. No signs of acute bone finding or destructive bone process. Spinal degenerative changes are worse at L5-S1. Grade 1 anterolisthesis of L4 on L5 is similar. IMPRESSION: *Slowly enlarging nodule in the left upper lobe raising the question of disease recurrence. Close attention on follow-up or tissue sampling may be helpful. *Stable post treatment changes in the left lower lobe. *Stable index nodule in the right lower lobe. *No signs of metastatic disease to the abdomen or pelvis. * Aortic Atherosclerosis (ICD10-I70.0). Electronically Signed   By: Zetta Bills M.D.   On: 07/18/2019 12:51    ASSESSMENT AND PLAN:  This is a very pleasant 73 years old white female with stage IV non-small cell lung cancer, adenocarcinoma with positive EGFR mutation with deletion in exon 19 and currently undergoing treatment with Gilotrif initially at a dose of 40 mg for 9 months followed by 30 mg by mouth daily status post 40 months.  The patient has been tolerating her treatment well with no concerning adverse effects. She had repeat CT scan of the chest, abdomen pelvis performed recently.  I personally and independently reviewed the scans and discussed the results with the patient today. Her scan showed no concerning findings for disease progression except for slowly enlarging left upper lobe nodule concerning for potential mild disease progression. I personally and independently reviewed the scan images and discussed the results with the patient today. I recommended for her to continue her current treatment with Gilotrif with the same dose but I will continue to monitor this enlarging left upper lobe pulmonary nodule closely on upcoming scan. For hypertension she was strongly advised to take  her blood pressure medication as prescribed and to consult with her primary care physician for adjustment of her medication if needed. I will see the patient back for follow-up visit in 2 months for evaluation and repeat blood work. She was advised to call immediately if she has any concerning symptoms in the interval. The patient voices understanding of current disease status and treatment options and is in agreement with the current care plan. All questions were answered. The patient knows to call the clinic with any problems, questions or concerns. We can certainly see the patient much sooner if necessary.  Disclaimer: This note was dictated with voice recognition software. Similar sounding words can inadvertently be transcribed and may not be corrected upon review.

## 2019-07-22 ENCOUNTER — Telehealth: Payer: Self-pay | Admitting: Internal Medicine

## 2019-07-22 NOTE — Telephone Encounter (Signed)
Scheduled appt per 9/28 los - pt aware of appt date and time

## 2019-07-30 ENCOUNTER — Other Ambulatory Visit: Payer: Self-pay | Admitting: Internal Medicine

## 2019-08-04 MED FILL — GILOTRIF 30 MG TABLET: 30 | 30 days supply | Qty: 30 | Fill #0

## 2019-09-01 MED FILL — GILOTRIF 30 MG TABLET: 30 | 30 days supply | Qty: 30 | Fill #1

## 2019-09-15 ENCOUNTER — Other Ambulatory Visit: Payer: Self-pay | Admitting: Internal Medicine

## 2019-09-16 ENCOUNTER — Inpatient Hospital Stay: Payer: Medicare Other | Attending: Internal Medicine | Admitting: Internal Medicine

## 2019-09-16 ENCOUNTER — Encounter: Payer: Self-pay | Admitting: Internal Medicine

## 2019-09-16 ENCOUNTER — Telehealth: Payer: Self-pay | Admitting: Internal Medicine

## 2019-09-16 ENCOUNTER — Other Ambulatory Visit: Payer: Self-pay

## 2019-09-16 ENCOUNTER — Inpatient Hospital Stay: Payer: Medicare Other

## 2019-09-16 VITALS — BP 145/86 | HR 82 | Temp 97.9°F | Resp 18 | Ht 61.0 in | Wt 165.8 lb

## 2019-09-16 DIAGNOSIS — Z79899 Other long term (current) drug therapy: Secondary | ICD-10-CM | POA: Diagnosis not present

## 2019-09-16 DIAGNOSIS — Z880 Allergy status to penicillin: Secondary | ICD-10-CM | POA: Diagnosis not present

## 2019-09-16 DIAGNOSIS — C3492 Malignant neoplasm of unspecified part of left bronchus or lung: Secondary | ICD-10-CM | POA: Diagnosis not present

## 2019-09-16 DIAGNOSIS — I10 Essential (primary) hypertension: Secondary | ICD-10-CM | POA: Diagnosis not present

## 2019-09-16 DIAGNOSIS — E785 Hyperlipidemia, unspecified: Secondary | ICD-10-CM | POA: Insufficient documentation

## 2019-09-16 DIAGNOSIS — Z5111 Encounter for antineoplastic chemotherapy: Secondary | ICD-10-CM

## 2019-09-16 DIAGNOSIS — C3432 Malignant neoplasm of lower lobe, left bronchus or lung: Secondary | ICD-10-CM | POA: Diagnosis not present

## 2019-09-16 DIAGNOSIS — C3490 Malignant neoplasm of unspecified part of unspecified bronchus or lung: Secondary | ICD-10-CM | POA: Diagnosis not present

## 2019-09-16 DIAGNOSIS — Z90722 Acquired absence of ovaries, bilateral: Secondary | ICD-10-CM | POA: Insufficient documentation

## 2019-09-16 DIAGNOSIS — E079 Disorder of thyroid, unspecified: Secondary | ICD-10-CM | POA: Diagnosis not present

## 2019-09-16 LAB — CBC WITH DIFFERENTIAL (CANCER CENTER ONLY)
Abs Immature Granulocytes: 0.02 10*3/uL (ref 0.00–0.07)
Basophils Absolute: 0 10*3/uL (ref 0.0–0.1)
Basophils Relative: 1 %
Eosinophils Absolute: 0.1 10*3/uL (ref 0.0–0.5)
Eosinophils Relative: 2 %
HCT: 39.8 % (ref 36.0–46.0)
Hemoglobin: 12.7 g/dL (ref 12.0–15.0)
Immature Granulocytes: 0 %
Lymphocytes Relative: 21 %
Lymphs Abs: 1.2 10*3/uL (ref 0.7–4.0)
MCH: 27.6 pg (ref 26.0–34.0)
MCHC: 31.9 g/dL (ref 30.0–36.0)
MCV: 86.5 fL (ref 80.0–100.0)
Monocytes Absolute: 0.6 10*3/uL (ref 0.1–1.0)
Monocytes Relative: 11 %
Neutro Abs: 3.8 10*3/uL (ref 1.7–7.7)
Neutrophils Relative %: 65 %
Platelet Count: 183 10*3/uL (ref 150–400)
RBC: 4.6 MIL/uL (ref 3.87–5.11)
RDW: 13 % (ref 11.5–15.5)
WBC Count: 5.8 10*3/uL (ref 4.0–10.5)
nRBC: 0 % (ref 0.0–0.2)

## 2019-09-16 LAB — CMP (CANCER CENTER ONLY)
ALT: 21 U/L (ref 0–44)
AST: 22 U/L (ref 15–41)
Albumin: 3.7 g/dL (ref 3.5–5.0)
Alkaline Phosphatase: 78 U/L (ref 38–126)
Anion gap: 10 (ref 5–15)
BUN: 15 mg/dL (ref 8–23)
CO2: 26 mmol/L (ref 22–32)
Calcium: 9 mg/dL (ref 8.9–10.3)
Chloride: 104 mmol/L (ref 98–111)
Creatinine: 1.07 mg/dL — ABNORMAL HIGH (ref 0.44–1.00)
GFR, Est AFR Am: >60 mL/min (ref >60)
GFR, Estimated: 52 mL/min — ABNORMAL LOW (ref >60)
Glucose, Bld: 87 mg/dL (ref 70–99)
Potassium: 4.5 mmol/L (ref 3.5–5.1)
Sodium: 140 mmol/L (ref 135–145)
Total Bilirubin: 0.9 mg/dL (ref 0.3–1.2)
Total Protein: 7.1 g/dL (ref 6.5–8.1)

## 2019-09-16 NOTE — Telephone Encounter (Signed)
Scheduled per los. Gave avs and calendar  

## 2019-09-16 NOTE — Progress Notes (Signed)
[facility] [phone]) [phone]   Fax:(336) [phone]  OFFICE PROGRESS NOTE  [name], MD [address] [place] [address]  DIAGNOSIS: Stage IV (T2a, N0, M1a) non-small cell lung cancer, adenocarcinoma with positive EGFR mutation with deletion in exon 19 diagnosed in June 2016 and presented with large mass in the left lower lobe in addition to multiple bilateral pulmonary nodules  PRIOR THERAPY: Gilotrif 40 mg by mouth daily started 05/11/2015, status post 9 months of treatment discontinued on 02/17/2016 secondary to extensive skin rash.  CURRENT THERAPY: Gilotrif 30 mg by mouth daily started 02/21/2016 status post 42 months of treatment.  INTERVAL HISTORY: Elizabeth [name] 73 y.o. female returns to the clinic today for follow-up visit.  The patient is feeling fine today with no concerning complaints.  She denied having any chest pain, shortness of breath, cough or hemoptysis.  She denied having any fever or chills.  She has no nausea, vomiting, diarrhea or constipation.  She denied having any headache or visual changes.  She has no skin rash.  She continues to tolerate her treatment with Gilotrif fairly well.  She is here today for evaluation and repeat blood work.   MEDICAL HISTORY: Past Medical History:  Diagnosis Date  . Adenocarcinoma of left lung, stage 4 ([facility]) 04/05/2015   Biopsy confirmed CT SCAN: There are innumerable bilateral pulmonary nodules. These range in size from about 5 mm to 2 cm. They demonstrate irregular indistinct borders, the larger ones demonstrating spiculation. PET SCAN: Diffuse pulmonary metastatic disease with a 4 cm dominant left lower low lung lesion. No enlarged or hypermetabolic mediastinal or hilar adenopathy.  . Allergy   . Anxiety   . Cancer ([facility])   . Change of skin related to chemotherapy 09/25/2017  . Drug-induced skin rash 01/17/2016  . Hyperlipidemia   . Hypertension   . Insomnia   . Prediabetes    . Thyroid disease     ALLERGIES:  is allergic to penicillins.  MEDICATIONS:  Current Outpatient Medications  Medication Sig Dispense Refill  . albuterol (VENTOLIN HFA) 108 (90 Base) MCG/ACT inhaler Inhale 2 puffs into the lungs every 4 (four) hours as needed for wheezing or shortness of breath. 18 g 1  . ALPRAZolam (XANAX) 1 MG tablet Take 1/2-1 tablet 2 - 3 x /day ONLY if needed for Panic or Anxiety Attack &  limit to 5 days /week to avoid addiction 90 tablet 0  . aspirin 81 MG tablet Take 81 mg by mouth at bedtime.     . Cholecalciferol (VITAMIN D3) 10000 UNITS TABS Take 10,000 Units by mouth 3 (three) times a week.     . Cyanocobalamin (VITAMIN B 12 PO) Take 1 tablet by mouth daily.    . diphenoxylate-atropine (LOMOTIL) 2.5-0.025 MG tablet 1 to 2 PO QID prn diarrhea 40 tablet 2  . GILOTRIF 30 MG tablet TAKE 1 TABLET (30MG) BY MOUTH ONCE DAILY. TAKE ON AN EMPTY STOMACH 1 HOUR BEFORE OR 2 HOURS AFTER A MEAL 30 tablet 2  . HYDROcodone-homatropine (HYCODAN) 5-1.5 MG/5ML syrup Take 5 mLs by mouth every 6 (six) hours as needed for cough. (Patient not taking: Reported on 07/09/2019) 120 mL 0  . ibuprofen (ADVIL,MOTRIN) 200 MG tablet Take 200 mg by mouth every 6 (six) hours as needed.    Marland Kitchen ipratropium (ATROVENT) 0.03 % nasal spray U 2 SPRAYS IEN TID    . levothyroxine (SYNTHROID) 50 MCG tablet Take 1 tablet daily on an  empty stomach with only water for 30 minutes & no Antacid meds, Calcium or Magnesium for 4 hours & avoid Biotin 90 tablet 1  . Melatonin 10 MG CAPS Take 1 capsule by mouth at bedtime as needed.    . montelukast (SINGULAIR) 10 MG tablet Take 1 tablet Daily for Allergies 90 tablet 3   No current facility-administered medications for this visit.     SURGICAL HISTORY:  Past Surgical History:  Procedure Laterality Date  . ABDOMINAL HYSTERECTOMY  1995   w BSO  . TONSILLECTOMY AND ADENOIDECTOMY      REVIEW OF SYSTEMS:  A comprehensive review of systems was negative.   PHYSICAL  EXAMINATION: General appearance: alert, cooperative and no distress Head: Normocephalic, without obvious abnormality, atraumatic Neck: no adenopathy, no JVD, supple, symmetrical, trachea midline and thyroid not enlarged, symmetric, no tenderness/mass/nodules Lymph nodes: Cervical, supraclavicular, and axillary nodes normal. Resp: clear to auscultation bilaterally Back: symmetric, no curvature. ROM normal. No CVA tenderness. Cardio: regular rate and rhythm, S1, S2 normal, no murmur, click, rub or gallop GI: soft, non-tender; bowel sounds normal; no masses,  no organomegaly Extremities: extremities normal, atraumatic, no cyanosis or edema  ECOG PERFORMANCE STATUS: 0 - Asymptomatic  Blood pressure (!) 145/86, pulse 82, temperature 97.9 F (36.6 C), temperature source Temporal, resp. rate 18, height _0  (1.549 m), weight 165 lb 12.8 oz (75.2 kg), SpO2 100 %.  LABORATORY DATA: Lab Results  Component Value Date   WBC 5.8 09/16/2019   HGB 12.7 09/16/2019   HCT 39.8 09/16/2019   MCV 86.5 09/16/2019   PLT 183 09/16/2019      Chemistry      Component Value Date/Time   NA 141 07/18/2019 0859   NA 139 10/26/2017 1252   K 4.4 07/18/2019 0859   K 4.1 10/26/2017 1252   CL 106 07/18/2019 0859   CO2 27 07/18/2019 0859   CO2 28 10/26/2017 1252   BUN 16 07/18/2019 0859   BUN 15.7 10/26/2017 1252   CREATININE 1.04 (H) 07/18/2019 0859   CREATININE 0.95 (H) 07/09/2019 1115   CREATININE 1.0 10/26/2017 1252      Component Value Date/Time   CALCIUM 9.0 07/18/2019 0859   CALCIUM 8.8 10/26/2017 1252   ALKPHOS 73 07/18/2019 0859   ALKPHOS 70 10/26/2017 1252   AST 21 07/18/2019 0859   AST 19 10/26/2017 1252   ALT 21 07/18/2019 0859   ALT 22 10/26/2017 1252   BILITOT 0.9 07/18/2019 0859   BILITOT 0.90 10/26/2017 1252       RADIOGRAPHIC STUDIES: No results found.  ASSESSMENT AND PLAN:  This is a very pleasant 73 years old white female with stage IV non-small cell lung cancer,  adenocarcinoma with positive EGFR mutation with deletion in exon 19 and currently undergoing treatment with Gilotrif initially at a dose of 40 mg for 9 months followed by 30 mg by mouth daily status post 42 months.  The patient has been tolerating this treatment well with no concerning complaints. CBC today is unremarkable for any abnormality.  Comprehensive metabolic panel is still pending. I recommended for the patient to continue her current treatment with Gilotrif with the same dose. She will come back for follow-up visit in 2 months for evaluation with repeat CT scan of the chest, abdomen and pelvis for restaging of her disease. The patient was advised to call immediately if she has any concerning symptoms in the interval. The patient voices understanding of current disease status and treatment options and is  in agreement with the current care plan. All questions were answered. The patient knows to call the clinic with any problems, questions or concerns. We can certainly see the patient much sooner if necessary.  Disclaimer: This note was dictated with voice recognition software. Similar sounding words can inadvertently be transcribed and may not be corrected upon review.

## 2019-10-02 MED FILL — GILOTRIF 30 MG TABLET: 30 | 30 days supply | Qty: 30 | Fill #2

## 2019-10-07 ENCOUNTER — Other Ambulatory Visit: Payer: Self-pay

## 2019-10-07 ENCOUNTER — Encounter: Payer: Self-pay | Admitting: Internal Medicine

## 2019-10-07 DIAGNOSIS — R0989 Other specified symptoms and signs involving the circulatory and respiratory systems: Secondary | ICD-10-CM | POA: Insufficient documentation

## 2019-10-07 DIAGNOSIS — E039 Hypothyroidism, unspecified: Secondary | ICD-10-CM

## 2019-10-07 MED ORDER — LEVOTHYROXINE SODIUM 50 MCG PO TABS: ORAL_TABLET | ORAL | 1 refills | Status: DC

## 2019-10-07 NOTE — Patient Instructions (Signed)

## 2019-10-07 NOTE — Progress Notes (Signed)
Annual Screening/Preventative Visit & Comprehensive Evaluation &  Examination     This very nice 73 y.o. MWF presents for a Screening /Preventative Visit & comprehensive evaluation and management of multiple medical co-morbidities.  Patient has been followed for HTN, HLD, Hypothyroidism, Prediabetes  and Vitamin D Deficiency.     In June 2016, she was dx'd with Stage 4 LLL Lung Cancer and is followed by Dr Earlie Server on [address].        HTN predates since 1997. Patient's BP has been controlled as she tapered off of her diuretics & BP's remained Normal.   Patient denies any cardiac symptoms as chest pain, palpitations, shortness of breath, dizziness or ankle swelling. Today's BP is at goal - 120/68.      Patient's hyperlipidemia is controlled with diet and medications. Patient denies myalgias or other medication SE's. Last lipids were not at goal:  Lab Results  Component Value Date   CHOL 176 07/09/2019   HDL 50 07/09/2019   LDLCALC 110 (H) 07/09/2019   TRIG 69 07/09/2019   CHOLHDL 3.5 07/09/2019       Patient has hx/o prediabetes  (A1c 5.7%  / 2012)  and patient denies reactive hypoglycemic symptoms, visual blurring, diabetic polys or paresthesias. Last A1c was Normal & at goal:  Lab Results  Component Value Date   HGBA1C 5.5 07/09/2019      Patient was dx'd Hypothyroid in 1996 & has been on thyroid replacement since then.     Finally, patient has history of Vitamin D Deficiency and last Vitamin D was at goal:  Lab Results  Component Value Date   VD25OH 77 07/09/2019    Current Outpatient Medications on File Prior to Visit  Medication Sig  . albuterol (VENTOLIN HFA) 108 (90 Base) MCG/ACT inhaler Inhale 2 puffs into the lungs every 4 (four) hours as needed for wheezing or shortness of breath.  . ALPRAZolam (XANAX) 1 MG tablet Take 1/2-1 tablet 2 - 3 x /day ONLY if needed for Panic or Anxiety Attack &  limit to 5 days /week to avoid addiction  . aspirin 81 MG tablet Take 81 mg  by mouth at bedtime.   . Cholecalciferol (VITAMIN D3) 10000 UNITS TABS Take 10,000 Units by mouth 3 (three) times a week.   . Cyanocobalamin (VITAMIN B 12 PO) Take 1 tablet by mouth daily.  Marland Kitchen GILOTRIF 30 MG tablet TAKE 1 TABLET (30MG ) BY MOUTH ONCE DAILY. TAKE ON AN EMPTY STOMACH 1 HOUR BEFORE OR 2 HOURS AFTER A MEAL  . ibuprofen (ADVIL,MOTRIN) 200 MG tablet Take 200 mg by mouth every 6 (six) hours as needed.  Marland Kitchen levothyroxine (SYNTHROID) 50 MCG tablet Take 1 tablet daily on an empty stomach with only water for 30 minutes & no Antacid meds, Calcium or Magnesium for 4 hours & avoid Biotin  . Melatonin 10 MG CAPS Take 1 capsule by mouth at bedtime as needed.  . montelukast (SINGULAIR) 10 MG tablet Take 1 tablet Daily for Allergies  . diphenoxylate-atropine (LOMOTIL) 2.5-0.025 MG tablet 1 to 2 PO QID prn diarrhea  . ipratropium (ATROVENT) 0.03 % nasal spray U 2 SPRAYS IEN TID   No current facility-administered medications on file prior to visit.   Allergies  Allergen Reactions  . Penicillins Swelling and Rash   Past Medical History:  Diagnosis Date  . Adenocarcinoma of left lung, stage 4 ([place]) 04/05/2015   Biopsy confirmed CT SCAN: There are innumerable bilateral pulmonary nodules. These range in size from about 5 mm  to 2 cm. They demonstrate irregular indistinct borders, the larger ones demonstrating spiculation. PET SCAN: Diffuse pulmonary metastatic disease with a 4 cm dominant left lower low lung lesion. No enlarged or hypermetabolic mediastinal or hilar adenopathy.  . Allergy   . Anxiety   . Cancer ([place])   . Change of skin related to chemotherapy 09/25/2017  . Drug-induced skin rash 01/17/2016  . Hyperlipidemia   . Hypertension   . Insomnia   . Prediabetes   . Thyroid disease    Health Maintenance  Topic Date Due  . TETANUS/TDAP  10/08/2019 (Originally 12/26/2015)  . MAMMOGRAM  10/31/2020  . DEXA SCAN  10/31/2020  . COLONOSCOPY  11/19/2022  . INFLUENZA VACCINE  Completed  . PNA vac  Low Risk Adult  Completed  . Hepatitis C Screening  Discontinued   Immunization History  Administered Date(s) Administered  . Influenza Split 07/16/2018  . Influenza, High Dose Seasonal PF 07/31/2014, 08/06/2017, 07/09/2019  . Influenza-Unspecified 09/13/2015, 07/23/2016, 08/06/2017  . Pneumococcal Conjugate-13 12/24/2015  . Pneumococcal Polysaccharide-23 07/22/2012  . Td 12/25/2005  . Zoster 01/09/2007   Last Colon - 11/19/2012 - Dr Deborha Payment - Recommended 10 yr f/u due Feb 2024  Last MGM - 10/31/2018  Past Surgical History:  Procedure Laterality Date  . ABDOMINAL HYSTERECTOMY  1995   w BSO  . TONSILLECTOMY AND ADENOIDECTOMY     Family History  Problem Relation Age of Onset  . Liver disease Mother   . Alcohol abuse Mother   . ALS Father   . Hypertension Father   . Dementia Maternal Grandmother 77  . Lung disease Maternal Grandfather   . Lung cancer Paternal Grandmother 11  . Alcohol abuse Paternal Grandfather   . Heart disease Paternal Grandfather   . Melanoma Paternal Aunt   . Bone cancer Maternal Uncle   . Dementia Paternal Aunt 14  . Breast cancer Cousin    Social History   Tobacco Use  . Smoking status: Former Smoker    Packs/day: 0.50    Years: 10.00    Pack years: 5.00    Quit date: 10/23/1974    Years since quitting: 44.9  . Smokeless tobacco: Never Used  Substance Use Topics  . Alcohol use: No    Comment: Rare  . Drug use: No    ROS Constitutional: Denies fever, chills, weight loss/gain, headaches, insomnia,  night sweats, and change in appetite. Does c/o fatigue. Eyes: Denies redness, blurred vision, diplopia, discharge, itchy, watery eyes.  ENT: Denies discharge, congestion, post nasal drip, epistaxis, sore throat, earache, hearing loss, dental pain, Tinnitus, Vertigo, Sinus pain, snoring.  Cardio: Denies chest pain, palpitations, irregular heartbeat, syncope, dyspnea, diaphoresis, orthopnea, PND, claudication, edema Respiratory: denies cough,  dyspnea, DOE, pleurisy, hoarseness, laryngitis, wheezing.  Gastrointestinal: Denies dysphagia, heartburn, reflux, water brash, pain, cramps, nausea, vomiting, bloating, diarrhea, constipation, hematemesis, melena, hematochezia, jaundice, hemorrhoids Genitourinary: Denies dysuria, frequency, urgency, nocturia, hesitancy, discharge, hematuria, flank pain Breast: Breast lumps, nipple discharge, bleeding.  Musculoskeletal: Denies arthralgia, myalgia, stiffness, Jt. Swelling, pain, limp, and strain/sprain. Denies falls. Skin: Denies puritis, rash, hives, warts, acne, eczema, changing in skin lesion Neuro: No weakness, tremor, incoordination, spasms, paresthesia, pain Psychiatric: Denies confusion, memory loss, sensory loss. Denies Depression. Endocrine: Denies change in weight, skin, hair change, nocturia, and paresthesia, diabetic polys, visual blurring, hyper / hypo glycemic episodes.  Heme/Lymph: No excessive bleeding, bruising, enlarged lymph nodes.  Physical Exam  BP 120/68   Pulse 84   Temp (!) 97.1 F (36.2 C)  Resp 16   Ht 4' 11.5" (1.511 m)   Wt 164 lb 6.4 oz (74.6 kg)   BMI 32.65 kg/m   General Appearance:  Over nourished, well groomed and in no apparent distress.  Eyes: PERRLA, EOMs, conjunctiva no swelling or erythema, normal fundi and vessels. Sinuses: No frontal/maxillary tenderness ENT/Mouth: EACs patent / TMs  nl. Nares clear without erythema, swelling, mucoid exudates. Oral hygiene is good. No erythema, swelling, or exudate. Tongue normal, non-obstructing. Tonsils not swollen or erythematous. Hearing normal.  Neck: Supple, thyroid not palpable. No bruits, nodes or JVD. Respiratory: Respiratory effort normal.  BS equal and clear bilateral without rales, rhonci, wheezing or stridor. Cardio: Heart sounds are normal with regular rate and rhythm and no murmurs, rubs or gallops. Peripheral pulses are normal and equal bilaterally without edema. No aortic or femoral bruits. Chest:  symmetric with normal excursions and percussion. Breasts: Symmetric, without lumps, nipple discharge, retractions, or fibrocystic changes.  Abdomen: Flat, soft with bowel sounds active. Nontender, no guarding, rebound, hernias, masses, or organomegaly.  Lymphatics: Non tender without lymphadenopathy.  Musculoskeletal: Full ROM all peripheral extremities, joint stability, 5/5 strength, and normal gait. Skin: Warm and dry without rashes, lesions, cyanosis, clubbing or  ecchymosis.  Neuro: Cranial nerves intact, reflexes equal bilaterally. Normal muscle tone, no cerebellar symptoms. Sensation intact.  Pysch: Alert and oriented X 3, normal affect, Insight and Judgment appropriate.   Assessment and Plan  1. Annual Preventative Screening Examination  2. Labile hypertension  - EKG 12-Lead - Urinalysis, Routine w reflex microscopic - Microalbumin / Creatinine Urine Ratio - CBC with Diff - COMPLETE METABOLIC PANEL WITH GFR - Magnesium - TSH  3. Hyperlipidemia, mixed  - EKG 12-Lead - Lipid Profile - TSH  4. Abnormal glucose  - EKG 12-Lead - Hemoglobin A1c ([facility]) - Insulin, random  5. Vitamin D deficiency  - Vitamin D (25 hydroxy)  6. Hypothyroidism  - TSH  7. Aortic atherosclerosis ([facility])  - EKG 12-Lead  8. Screening for colorectal cancer  - POC Hemoccult Bld/Stl   9. Former smoker  - EKG 12-Lead  10. Screening for ischemic heart disease  - EKG 12-Lead  11. FHx: heart disease  - EKG 12-Lead  12. Medication management  - Urinalysis, Routine w reflex microscopic - Microalbumin / Creatinine Urine Ratio - CBC with Diff - COMPLETE METABOLIC PANEL WITH GFR - Magnesium - Lipid Profile - TSH - Hemoglobin A1c ([facility]) - Insulin, random - Vitamin D (25 hydroxy)         Patient was counseled in prudent diet to achieve/maintain BMI less than 25 for weight control, BP monitoring, regular exercise and medications. Discussed med's effects and SE's. Screening  labs and tests as requested with regular follow-up as recommended. Over 40 minutes of exam, counseling, chart review and high complex critical decision making was performed.   Kirtland Bouchard, MD

## 2019-10-08 ENCOUNTER — Encounter: Payer: Self-pay | Admitting: Physician Assistant

## 2019-10-08 ENCOUNTER — Ambulatory Visit (INDEPENDENT_AMBULATORY_CARE_PROVIDER_SITE_OTHER): Payer: Medicare Other | Admitting: Internal Medicine

## 2019-10-08 ENCOUNTER — Other Ambulatory Visit: Payer: Self-pay

## 2019-10-08 VITALS — BP 120/68 | HR 84 | Temp 97.1°F | Resp 16 | Ht 59.5 in | Wt 164.4 lb

## 2019-10-08 DIAGNOSIS — Z136 Encounter for screening for cardiovascular disorders: Secondary | ICD-10-CM

## 2019-10-08 DIAGNOSIS — R0989 Other specified symptoms and signs involving the circulatory and respiratory systems: Secondary | ICD-10-CM

## 2019-10-08 DIAGNOSIS — Z79899 Other long term (current) drug therapy: Secondary | ICD-10-CM

## 2019-10-08 DIAGNOSIS — E559 Vitamin D deficiency, unspecified: Secondary | ICD-10-CM

## 2019-10-08 DIAGNOSIS — R7309 Other abnormal glucose: Secondary | ICD-10-CM

## 2019-10-08 DIAGNOSIS — Z8249 Family history of ischemic heart disease and other diseases of the circulatory system: Secondary | ICD-10-CM

## 2019-10-08 DIAGNOSIS — E039 Hypothyroidism, unspecified: Secondary | ICD-10-CM

## 2019-10-08 DIAGNOSIS — Z0000 Encounter for general adult medical examination without abnormal findings: Secondary | ICD-10-CM

## 2019-10-08 DIAGNOSIS — Z87891 Personal history of nicotine dependence: Secondary | ICD-10-CM

## 2019-10-08 DIAGNOSIS — Z1211 Encounter for screening for malignant neoplasm of colon: Secondary | ICD-10-CM

## 2019-10-08 DIAGNOSIS — I700 Atherosclerosis of aorta: Secondary | ICD-10-CM

## 2019-10-08 DIAGNOSIS — I10 Essential (primary) hypertension: Secondary | ICD-10-CM

## 2019-10-08 DIAGNOSIS — E782 Mixed hyperlipidemia: Secondary | ICD-10-CM

## 2019-10-08 DIAGNOSIS — Z0001 Encounter for general adult medical examination with abnormal findings: Secondary | ICD-10-CM

## 2019-10-09 LAB — URINALYSIS, ROUTINE W REFLEX MICROSCOPIC
Bacteria, UA: NONE SEEN /HPF
Bilirubin Urine: NEGATIVE
Glucose, UA: NEGATIVE
Hgb urine dipstick: NEGATIVE
Hyaline Cast: NONE SEEN /LPF
Ketones, ur: NEGATIVE
Nitrite: NEGATIVE
Protein, ur: NEGATIVE
RBC / HPF: NONE SEEN /HPF (ref 0–2)
Specific Gravity, Urine: 1.012 (ref 1.001–1.03)
Squamous Epithelial / HPF: NONE SEEN /HPF (ref <=5)
pH: 5.5 (ref 5.0–8.0)

## 2019-10-09 LAB — LIPID PANEL
Cholesterol: 173 mg/dL (ref <200)
HDL: 55 mg/dL (ref >=50)
LDL Cholesterol (Calc): 104 mg/dL (calc) — ABNORMAL HIGH
Non-HDL Cholesterol (Calc): 118 mg/dL (calc) (ref <130)
Total CHOL/HDL Ratio: 3.1 (calc) (ref <5.0)
Triglycerides: 53 mg/dL (ref <150)

## 2019-10-09 LAB — COMPLETE METABOLIC PANEL WITH GFR
AG Ratio: 1.5 (calc) (ref 1.0–2.5)
ALT: 18 U/L (ref 6–29)
AST: 19 U/L (ref 10–35)
Albumin: 4 g/dL (ref 3.6–5.1)
Alkaline phosphatase (APISO): 67 U/L (ref 37–153)
BUN/Creatinine Ratio: 15 (calc) (ref 6–22)
BUN: 15 mg/dL (ref 7–25)
CO2: 28 mmol/L (ref 20–32)
Calcium: 9.3 mg/dL (ref 8.6–10.4)
Chloride: 104 mmol/L (ref 98–110)
Creat: 1.01 mg/dL — ABNORMAL HIGH (ref 0.60–0.93)
GFR, Est African American: 64 mL/min/{1.73_m2} (ref >=60)
GFR, Est Non African American: 56 mL/min/{1.73_m2} — ABNORMAL LOW (ref >=60)
Globulin: 2.7 g/dL (calc) (ref 1.9–3.7)
Glucose, Bld: 78 mg/dL (ref 65–99)
Potassium: 4.3 mmol/L (ref 3.5–5.3)
Sodium: 140 mmol/L (ref 135–146)
Total Bilirubin: 0.6 mg/dL (ref 0.2–1.2)
Total Protein: 6.7 g/dL (ref 6.1–8.1)

## 2019-10-09 LAB — INSULIN, RANDOM: Insulin: 6.6 u[IU]/mL

## 2019-10-09 LAB — MICROALBUMIN / CREATININE URINE RATIO
Creatinine, Urine: 81 mg/dL (ref 20–275)
Microalb Creat Ratio: 6 mcg/mg creat (ref <30)
Microalb, Ur: 0.5 mg/dL

## 2019-10-09 LAB — CBC WITH DIFFERENTIAL/PLATELET
Absolute Monocytes: 473 cells/uL (ref 200–950)
Basophils Absolute: 50 cells/uL (ref 0–200)
Basophils Relative: 0.9 %
Eosinophils Absolute: 154 cells/uL (ref 15–500)
Eosinophils Relative: 2.8 %
HCT: 38.1 % (ref 35.0–45.0)
Hemoglobin: 12.4 g/dL (ref 11.7–15.5)
Lymphs Abs: 1315 cells/uL (ref 850–3900)
MCH: 27.9 pg (ref 27.0–33.0)
MCHC: 32.5 g/dL (ref 32.0–36.0)
MCV: 85.6 fL (ref 80.0–100.0)
MPV: 12 fL (ref 7.5–12.5)
Monocytes Relative: 8.6 %
Neutro Abs: 3509 cells/uL (ref 1500–7800)
Neutrophils Relative %: 63.8 %
Platelets: 182 10*3/uL (ref 140–400)
RBC: 4.45 10*6/uL (ref 3.80–5.10)
RDW: 12.9 % (ref 11.0–15.0)
Total Lymphocyte: 23.9 %
WBC: 5.5 10*3/uL (ref 3.8–10.8)

## 2019-10-09 LAB — VITAMIN D 25 HYDROXY (VIT D DEFICIENCY, FRACTURES): Vit D, 25-Hydroxy: 75 ng/mL (ref 30–100)

## 2019-10-09 LAB — HEMOGLOBIN A1C
Hgb A1c MFr Bld: 5.5 % of total Hgb (ref <5.7)
Mean Plasma Glucose: 111 (calc)
eAG (mmol/L): 6.2 (calc)

## 2019-10-09 LAB — TSH: TSH: 3.98 mIU/L (ref 0.40–4.50)

## 2019-10-09 LAB — MAGNESIUM: Magnesium: 1.9 mg/dL (ref 1.5–2.5)

## 2019-10-15 ENCOUNTER — Telehealth: Payer: Self-pay | Admitting: Internal Medicine

## 2019-10-15 NOTE — Telephone Encounter (Signed)
Returned patient's phone call regarding rescheduling 01/26 appointments, provider appointment has moved to 02/02 after the CT scan.

## 2019-10-22 ENCOUNTER — Other Ambulatory Visit: Payer: Self-pay | Admitting: Internal Medicine

## 2019-10-22 MED ORDER — MUPIROCIN 2 % EX OINT: TOPICAL_OINTMENT | CUTANEOUS | 3 refills | Status: DC

## 2019-10-28 ENCOUNTER — Other Ambulatory Visit: Payer: Self-pay | Admitting: Internal Medicine

## 2019-11-03 MED FILL — GILOTRIF 30 MG TABLET: 30 | 30 days supply | Qty: 30 | Fill #0

## 2019-11-07 ENCOUNTER — Encounter: Payer: Self-pay | Admitting: Internal Medicine

## 2019-11-10 ENCOUNTER — Encounter: Payer: Self-pay | Admitting: *Deleted

## 2019-11-18 ENCOUNTER — Ambulatory Visit: Payer: Medicare Other | Admitting: Internal Medicine

## 2019-11-18 ENCOUNTER — Other Ambulatory Visit: Payer: Self-pay

## 2019-11-18 ENCOUNTER — Inpatient Hospital Stay: Payer: Medicare Other | Attending: Internal Medicine

## 2019-11-18 ENCOUNTER — Ambulatory Visit (HOSPITAL_COMMUNITY)
Admission: RE | Admit: 2019-11-18 | Discharge: 2019-11-18 | Disposition: A | Discharge status: Home / Self Care | Payer: Medicare Other | Source: Ambulatory Visit | Attending: Internal Medicine | Admitting: Internal Medicine

## 2019-11-18 DIAGNOSIS — C3432 Malignant neoplasm of lower lobe, left bronchus or lung: Secondary | ICD-10-CM | POA: Diagnosis present

## 2019-11-18 DIAGNOSIS — C3490 Malignant neoplasm of unspecified part of unspecified bronchus or lung: Secondary | ICD-10-CM | POA: Diagnosis not present

## 2019-11-18 LAB — CMP (CANCER CENTER ONLY)
ALT: 21 U/L (ref 0–44)
AST: 21 U/L (ref 15–41)
Albumin: 3.6 g/dL (ref 3.5–5.0)
Alkaline Phosphatase: 75 U/L (ref 38–126)
Anion gap: 9 (ref 5–15)
BUN: 16 mg/dL (ref 8–23)
CO2: 27 mmol/L (ref 22–32)
Calcium: 8.8 mg/dL — ABNORMAL LOW (ref 8.9–10.3)
Chloride: 105 mmol/L (ref 98–111)
Creatinine: 1 mg/dL (ref 0.44–1.00)
GFR, Est AFR Am: >60 mL/min (ref >60)
GFR, Estimated: 56 mL/min — ABNORMAL LOW (ref >60)
Glucose, Bld: 88 mg/dL (ref 70–99)
Potassium: 4.3 mmol/L (ref 3.5–5.1)
Sodium: 141 mmol/L (ref 135–145)
Total Bilirubin: 0.8 mg/dL (ref 0.3–1.2)
Total Protein: 6.8 g/dL (ref 6.5–8.1)

## 2019-11-18 LAB — CBC WITH DIFFERENTIAL (CANCER CENTER ONLY)
Abs Immature Granulocytes: 0.01 10*3/uL (ref 0.00–0.07)
Basophils Absolute: 0.1 10*3/uL (ref 0.0–0.1)
Basophils Relative: 1 %
Eosinophils Absolute: 0.2 10*3/uL (ref 0.0–0.5)
Eosinophils Relative: 3 %
HCT: 39 % (ref 36.0–46.0)
Hemoglobin: 12.5 g/dL (ref 12.0–15.0)
Immature Granulocytes: 0 %
Lymphocytes Relative: 22 %
Lymphs Abs: 1.4 10*3/uL (ref 0.7–4.0)
MCH: 27.7 pg (ref 26.0–34.0)
MCHC: 32.1 g/dL (ref 30.0–36.0)
MCV: 86.5 fL (ref 80.0–100.0)
Monocytes Absolute: 0.7 10*3/uL (ref 0.1–1.0)
Monocytes Relative: 11 %
Neutro Abs: 3.9 10*3/uL (ref 1.7–7.7)
Neutrophils Relative %: 63 %
Platelet Count: 196 10*3/uL (ref 150–400)
RBC: 4.51 MIL/uL (ref 3.87–5.11)
RDW: 12.9 % (ref 11.5–15.5)
WBC Count: 6.2 10*3/uL (ref 4.0–10.5)
nRBC: 0 % (ref 0.0–0.2)

## 2019-11-18 MED ORDER — IOHEXOL 300 MG/ML  SOLN
100.0000 mL | Freq: Once | INTRAMUSCULAR | Status: AC | PRN
  Administered 2019-11-18: 100 mL via INTRAVENOUS

## 2019-11-18 MED ORDER — SODIUM CHLORIDE (PF) 0.9 % IJ SOLN
INTRAMUSCULAR | Status: AC
  Filled 2019-11-18: qty 50

## 2019-11-24 ENCOUNTER — Other Ambulatory Visit: Payer: Self-pay | Admitting: Internal Medicine

## 2019-11-25 ENCOUNTER — Inpatient Hospital Stay: Payer: Medicare Other | Attending: Internal Medicine | Admitting: Internal Medicine

## 2019-11-25 ENCOUNTER — Encounter: Payer: Self-pay | Admitting: Internal Medicine

## 2019-11-25 ENCOUNTER — Other Ambulatory Visit: Payer: Self-pay

## 2019-11-25 VITALS — BP 138/83 | HR 84 | Temp 98.5°F | Resp 18 | Ht 59.0 in | Wt 169.0 lb

## 2019-11-25 DIAGNOSIS — M5136 Other intervertebral disc degeneration, lumbar region: Secondary | ICD-10-CM | POA: Insufficient documentation

## 2019-11-25 DIAGNOSIS — K5730 Diverticulosis of large intestine without perforation or abscess without bleeding: Secondary | ICD-10-CM | POA: Diagnosis not present

## 2019-11-25 DIAGNOSIS — J479 Bronchiectasis, uncomplicated: Secondary | ICD-10-CM | POA: Insufficient documentation

## 2019-11-25 DIAGNOSIS — E041 Nontoxic single thyroid nodule: Secondary | ICD-10-CM | POA: Insufficient documentation

## 2019-11-25 DIAGNOSIS — Z90722 Acquired absence of ovaries, bilateral: Secondary | ICD-10-CM | POA: Insufficient documentation

## 2019-11-25 DIAGNOSIS — I10 Essential (primary) hypertension: Secondary | ICD-10-CM | POA: Diagnosis not present

## 2019-11-25 DIAGNOSIS — Z79899 Other long term (current) drug therapy: Secondary | ICD-10-CM | POA: Diagnosis not present

## 2019-11-25 DIAGNOSIS — M4316 Spondylolisthesis, lumbar region: Secondary | ICD-10-CM | POA: Diagnosis not present

## 2019-11-25 DIAGNOSIS — C3432 Malignant neoplasm of lower lobe, left bronchus or lung: Secondary | ICD-10-CM | POA: Insufficient documentation

## 2019-11-25 DIAGNOSIS — C3492 Malignant neoplasm of unspecified part of left bronchus or lung: Secondary | ICD-10-CM

## 2019-11-25 DIAGNOSIS — R21 Rash and other nonspecific skin eruption: Secondary | ICD-10-CM | POA: Diagnosis not present

## 2019-11-25 DIAGNOSIS — Z880 Allergy status to penicillin: Secondary | ICD-10-CM | POA: Insufficient documentation

## 2019-11-25 DIAGNOSIS — E785 Hyperlipidemia, unspecified: Secondary | ICD-10-CM | POA: Insufficient documentation

## 2019-11-25 DIAGNOSIS — K449 Diaphragmatic hernia without obstruction or gangrene: Secondary | ICD-10-CM | POA: Diagnosis not present

## 2019-11-25 NOTE — Progress Notes (Signed)
[place] Telephone:([phone]   [phone]  OFFICE PROGRESS NOTE  [name], MD 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  CURRENT THERAPY: Gilotrif 30 mg by mouth daily started 02/21/2016 status post 44 months of treatment.  INTERVAL HISTORY: Elizabeth [name] 74 y.o. female returns to the clinic today for follow-up visit accompanied by her husband.  The patient is feeling fine today with no concerning complaints.  She denied having any chest pain, shortness of breath, cough or hemoptysis.  She denied having any diarrhea but has mild rash in the nostril area.  She denied having any fever or chills.  She has no nausea, vomiting, diarrhea or constipation.  She has no headache or visual changes.  She continues to tolerate her treatment with Gilotrif fairly well.  She had repeat CT scan of the chest, abdomen pelvis performed recently and she is here for evaluation and discussion of her risk her results.  MEDICAL HISTORY: Past Medical History:  Diagnosis Date  . Adenocarcinoma of left lung, stage 4 ([place]) 04/05/2015   Biopsy confirmed CT SCAN: There are innumerable bilateral pulmonary nodules. These range in size from about 5 mm to 2 cm. They demonstrate irregular indistinct borders, the larger ones demonstrating spiculation. PET SCAN: Diffuse pulmonary metastatic disease with a 4 cm dominant left lower low lung lesion. No enlarged or hypermetabolic mediastinal or hilar adenopathy.  . Allergy   . Anxiety   . Cancer ([place])   . Change of  skin related to chemotherapy 09/25/2017  . Drug-induced skin rash 01/17/2016  . Hyperlipidemia   . Hypertension   . Insomnia   . Prediabetes   . Thyroid disease     ALLERGIES:  is allergic to penicillins.  MEDICATIONS:  Current Outpatient Medications  Medication Sig Dispense Refill  . albuterol (VENTOLIN HFA) 108 (90 Base) MCG/ACT inhaler Inhale 2 puffs into the lungs every 4 (four) hours as needed for wheezing or shortness of breath. 18 g 1  . ALPRAZolam (XANAX) 1 MG tablet Take 1/2-1 tablet 2 - 3 x /day ONLY if needed for Panic or Anxiety Attack &  limit to 5 days /week to avoid Addiction & Dementia 90 tablet 0  . aspirin 81 MG tablet Take 81 mg by mouth at bedtime.     . Cholecalciferol (VITAMIN D3) 10000 UNITS TABS Take 10,000 Units by mouth 3 (three) times a week.     . Cyanocobalamin (VITAMIN B 12 PO) Take 1 tablet by mouth daily.    Marland Kitchen GILOTRIF 30 MG tablet TAKE 1 TABLET (30MG) BY MOUTH ONCE DAILY. TAKE ON AN EMPTY STOMACH 1 HOUR BEFORE OR 2 HOURS AFTER A MEAL 30 tablet 2  . ibuprofen (ADVIL,MOTRIN) 200 MG tablet Take 200 mg by mouth every 6 (six) hours as needed.    Marland Kitchen ipratropium (ATROVENT) 0.03 % nasal spray U 2 SPRAYS IEN TID    . levothyroxine (SYNTHROID) 50 MCG tablet Take 1 tablet daily on an empty stomach with only water for 30 minutes & no Antacid meds, Calcium or Magnesium for 4 hours &  avoid Biotin 90 tablet 1  . Melatonin 10 MG CAPS Take 1 capsule by mouth at bedtime as needed.    . montelukast (SINGULAIR) 10 MG tablet Take 1 tablet Daily for Allergies 90 tablet 3  . mupirocin ointment (BACTROBAN) 2 % Place 1 application in the nose 2 x /day 22 g 3   No current facility-administered medications for this visit.    SURGICAL HISTORY:  Past Surgical History:  Procedure Laterality Date  . ABDOMINAL HYSTERECTOMY  1995   w BSO  . TONSILLECTOMY AND ADENOIDECTOMY      REVIEW OF SYSTEMS:  Constitutional: negative Eyes: negative Ears, nose, mouth, throat, and face:  negative Respiratory: negative Cardiovascular: negative Gastrointestinal: negative Genitourinary:negative Integument/breast: negative Hematologic/lymphatic: negative Musculoskeletal:negative Neurological: negative Behavioral/Psych: negative Endocrine: negative Allergic/Immunologic: negative   PHYSICAL EXAMINATION: General appearance: alert, cooperative and no distress Head: Normocephalic, without obvious abnormality, atraumatic Neck: no adenopathy, no JVD, supple, symmetrical, trachea midline and thyroid not enlarged, symmetric, no tenderness/mass/nodules Lymph nodes: Cervical, supraclavicular, and axillary nodes normal. Resp: clear to auscultation bilaterally Back: symmetric, no curvature. ROM normal. No CVA tenderness. Cardio: regular rate and rhythm, S1, S2 normal, no murmur, click, rub or gallop GI: soft, non-tender; bowel sounds normal; no masses,  no organomegaly Extremities: extremities normal, atraumatic, no cyanosis or edema Neurologic: Alert and oriented X 3, normal strength and tone. Normal symmetric reflexes. Normal coordination and gait  ECOG PERFORMANCE STATUS: 0 - Asymptomatic  Blood pressure 138/83, pulse 84, temperature 98.5 F (36.9 C), temperature source Temporal, resp. rate 18, height '4\' 11"'$  (1.499 m), weight 169 lb (76.7 kg), SpO2 99 %.  LABORATORY DATA: Lab Results  Component Value Date   WBC 6.2 11/18/2019   HGB 12.5 11/18/2019   HCT 39.0 11/18/2019   MCV 86.5 11/18/2019   PLT 196 11/18/2019      Chemistry      Component Value Date/Time   NA 141 11/18/2019 0931   NA 139 10/26/2017 1252   K 4.3 11/18/2019 0931   K 4.1 10/26/2017 1252   CL 105 11/18/2019 0931   CO2 27 11/18/2019 0931   CO2 28 10/26/2017 1252   BUN 16 11/18/2019 0931   BUN 15.7 10/26/2017 1252   CREATININE 1.00 11/18/2019 0931   CREATININE 1.01 (H) 10/08/2019 1133   CREATININE 1.0 10/26/2017 1252      Component Value Date/Time   CALCIUM 8.8 (L) 11/18/2019 0931   CALCIUM 8.8  10/26/2017 1252   ALKPHOS 75 11/18/2019 0931   ALKPHOS 70 10/26/2017 1252   AST 21 11/18/2019 0931   AST 19 10/26/2017 1252   ALT 21 11/18/2019 0931   ALT 22 10/26/2017 1252   BILITOT 0.8 11/18/2019 0931   BILITOT 0.90 10/26/2017 1252       RADIOGRAPHIC STUDIES: CT Chest W Contrast  Result Date: 11/18/2019 CLINICAL DATA:  Restaging non-small cell lung cancer. Oral chemotherapy ongoing. EXAM: CT CHEST, ABDOMEN, AND PELVIS WITH CONTRAST TECHNIQUE: Multidetector CT imaging of the chest, abdomen and pelvis was performed following the standard protocol during bolus administration of intravenous contrast. CONTRAST:  140m OMNIPAQUE IOHEXOL 300 MG/ML  SOLN COMPARISON:  Prior studies 07/18/2019 and 03/21/2019. FINDINGS: CT CHEST FINDINGS Cardiovascular: Minimal aortic atherosclerosis. No acute vascular findings. The heart size is normal. There is no pericardial effusion. Mediastinum/Nodes: There are no enlarged mediastinal, hilar or axillary lymph nodes. Stable 5 mm left thyroid nodule on image 9/2, not requiring additional evaluation. Stable small hiatal hernia. Lungs/Pleura: There is no pleural effusion or pneumothorax. Post  treatment fibrosis with associated volume loss and bronchiectasis in the superior segment of the left lower lobe appears stable. Multiple ground-glass and part solid nodules are present in both lungs. Most of these are unchanged. However, there is one in the left upper lobe which shows progressive enlargement, measuring 12 x 7 mm on image 63/4 (previously 9 x 9 mm). No other new or enlarging pulmonary nodules. Musculoskeletal/Chest wall: No chest wall mass or suspicious osseous findings. CT ABDOMEN AND PELVIS FINDINGS Hepatobiliary: Stable 4.0 cm hemangioma posteriorly in the right hepatic lobe on image 48/2. No new or enlarging hepatic lesions. No evidence of gallstones, gallbladder wall thickening or biliary dilatation. Pancreas: Unremarkable. No pancreatic ductal dilatation or  surrounding inflammatory changes. Spleen: Normal in size without focal abnormality. Adrenals/Urinary Tract: Both adrenal glands appear normal. The kidneys appear normal without evidence of urinary tract calculus, suspicious lesion or hydronephrosis. No bladder abnormalities are seen. Stomach/Bowel: No evidence of bowel wall thickening, distention or surrounding inflammatory change. Distal colonic diverticulosis. Vascular/Lymphatic: There are no enlarged abdominal or pelvic lymph nodes. Mild aortic and branch vessel atherosclerosis. No acute vascular findings. Reproductive: Hysterectomy. No adnexal mass. Probable mild pelvic floor laxity. Other: No evidence of abdominal wall mass or hernia. No ascites. Musculoskeletal: No acute or significant osseous findings. Stable degenerative disc disease in the lower lumbar spine with a grade 1 anterolisthesis at L4-5 contributing to mild biforaminal narrowing. IMPRESSION: 1. Enlarging left upper lobe pulmonary nodule, suspicious for bronchogenic carcinoma. Consider PET-CT for further evaluation, versus continued close CT follow-up. 2. The remaining ground-glass and part solid nodules in both lungs are unchanged. 3. No evidence of metastatic disease in the abdomen or pelvis. 4. Stable hepatic hemangioma. Electronically Signed   By: Richardean Sale M.D.   On: 11/18/2019 12:37   CT Abdomen Pelvis W Contrast  Result Date: 11/18/2019 CLINICAL DATA:  Restaging non-small cell lung cancer. Oral chemotherapy ongoing. EXAM: CT CHEST, ABDOMEN, AND PELVIS WITH CONTRAST TECHNIQUE: Multidetector CT imaging of the chest, abdomen and pelvis was performed following the standard protocol during bolus administration of intravenous contrast. CONTRAST:  165m OMNIPAQUE IOHEXOL 300 MG/ML  SOLN COMPARISON:  Prior studies 07/18/2019 and 03/21/2019. FINDINGS: CT CHEST FINDINGS Cardiovascular: Minimal aortic atherosclerosis. No acute vascular findings. The heart size is normal. There is no  pericardial effusion. Mediastinum/Nodes: There are no enlarged mediastinal, hilar or axillary lymph nodes. Stable 5 mm left thyroid nodule on image 9/2, not requiring additional evaluation. Stable small hiatal hernia. Lungs/Pleura: There is no pleural effusion or pneumothorax. Post treatment fibrosis with associated volume loss and bronchiectasis in the superior segment of the left lower lobe appears stable. Multiple ground-glass and part solid nodules are present in both lungs. Most of these are unchanged. However, there is one in the left upper lobe which shows progressive enlargement, measuring 12 x 7 mm on image 63/4 (previously 9 x 9 mm). No other new or enlarging pulmonary nodules. Musculoskeletal/Chest wall: No chest wall mass or suspicious osseous findings. CT ABDOMEN AND PELVIS FINDINGS Hepatobiliary: Stable 4.0 cm hemangioma posteriorly in the right hepatic lobe on image 48/2. No new or enlarging hepatic lesions. No evidence of gallstones, gallbladder wall thickening or biliary dilatation. Pancreas: Unremarkable. No pancreatic ductal dilatation or surrounding inflammatory changes. Spleen: Normal in size without focal abnormality. Adrenals/Urinary Tract: Both adrenal glands appear normal. The kidneys appear normal without evidence of urinary tract calculus, suspicious lesion or hydronephrosis. No bladder abnormalities are seen. Stomach/Bowel: No evidence of bowel wall thickening, distention or surrounding inflammatory  change. Distal colonic diverticulosis. Vascular/Lymphatic: There are no enlarged abdominal or pelvic lymph nodes. Mild aortic and branch vessel atherosclerosis. No acute vascular findings. Reproductive: Hysterectomy. No adnexal mass. Probable mild pelvic floor laxity. Other: No evidence of abdominal wall mass or hernia. No ascites. Musculoskeletal: No acute or significant osseous findings. Stable degenerative disc disease in the lower lumbar spine with a grade 1 anterolisthesis at L4-5  contributing to mild biforaminal narrowing. IMPRESSION: 1. Enlarging left upper lobe pulmonary nodule, suspicious for bronchogenic carcinoma. Consider PET-CT for further evaluation, versus continued close CT follow-up. 2. The remaining ground-glass and part solid nodules in both lungs are unchanged. 3. No evidence of metastatic disease in the abdomen or pelvis. 4. Stable hepatic hemangioma. Electronically Signed   By: Richardean Sale M.D.   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  The patient continues to tolerate this treatment well with no concerning adverse effects. She had repeat CT scan of the chest, abdomen pelvis performed recently.  I personally and independently reviewed the scans and discussed the results with the patient and her husband and showed him the images today. Her scan showed further enlargement of the left upper lobe pulmonary nodule suspicious for disease progression. I recommended for the patient to continue her current treatment with Gilotrif but I would refer the patient to radiation oncology for consideration of SBRT to the enlarging left upper lobe lung nodule. For the skin rash she will continue with the clindamycin lotion as needed. I will see the patient back for follow-up visit in 6 weeks for evaluation and repeat blood work. She was advised to call immediately if she has any concerning symptoms in the interval. The patient voices understanding of current disease status and treatment options and is in agreement with the current care plan. All questions were answered. The patient knows to call the clinic with any problems, questions or concerns. We can certainly see  the patient much sooner if necessary.  Disclaimer: This note was dictated with voice recognition software. Similar sounding words can inadvertently be transcribed and may not be corrected upon review.

## 2019-11-26 NOTE — Progress Notes (Signed)
Thoracic Location of Tumor / Histology: DIAGNOSIS: Stage IV (T2a, N0, M1a) non-small cell lung cancer, adenocarcinoma with positive EGFR mutation with deletion in exon 19 diagnosed in June 2016 and presented with large mass in the left lower lobe in addition to multiple bilateral pulmonary nodules   Tobacco/Marijuana/Snuff/ETOH use:  Tobacco Use  . Smoking status: Former Smoker    Packs/day: 0.50    Years: 10.00    Pack years: 5.00    Quit date: 10/23/1974    Years since quitting: 44.9  . Smokeless tobacco: Never Used  Substance Use Topics  . Alcohol use: No    Comment: Rare  . Drug use: No      Past/Anticipated interventions by medical oncology, if any: Per Dr. Julien Nordmann 11/25/19: She had repeat CT scan of the chest, abdomen pelvis performed recently.  I personally and independently reviewed the scans and discussed the results with the patient and her husband and showed him the images today. Her scan showed further enlargement of the left upper lobe pulmonary nodule suspicious for disease progression. I recommended for the patient to continue her current treatment with Gilotrif but I would refer the patient to radiation oncology for consideration of SBRT to the enlarging left upper lobe lung nodule. For the skin rash she will continue with the clindamycin lotion as needed. I will see the patient back for follow-up visit in 6 weeks for evaluation and repeat blood work.  Signs/Symptoms Weight changes, if any:  Wt Readings from Last 3 Encounters:  11/27/19 169 lb 9.6 oz (76.9 kg)  11/25/19 169 lb (76.7 kg)  10/08/19 164 lb 6.4 oz (74.6 kg)      She denied having any chest pain, shortness of breath, cough or hemoptysis.  She denied having any diarrhea but has mild rash in the nostril area.  She denied having any fever or chills.  She has no nausea, vomiting, diarrhea or constipation.  She has no headache or visual changes.  Pain issues, if any:  Pt denies c/o pain.     Pt  reports she has "a little cough that I've had forever, could be sinus drainage or a habit".  SAFETY ISSUES:  Prior radiation? no  Pacemaker/ICD? no   Possible current pregnancy?no  Is the patient on methotrexate? no  Current Complaints / other details:  Pt presents today for consult with Dr. Sondra Come for Radiation Oncology. Pt is unaccompanied.   BP (!) 146/80 (BP Location: Left Arm, Patient Position: Sitting, Cuff Size: Normal)   Pulse 83   Temp 98.5 F (36.9 C)   Resp 20   Wt 169 lb 9.6 oz (76.9 kg)   SpO2 99%   BMI 34.26 kg/m   Loma Sousa, RN BSN

## 2019-11-27 ENCOUNTER — Encounter: Payer: Self-pay | Admitting: Radiation Oncology

## 2019-11-27 ENCOUNTER — Other Ambulatory Visit: Payer: Self-pay

## 2019-11-27 ENCOUNTER — Ambulatory Visit
Admission: RE | Admit: 2019-11-27 | Discharge: 2019-11-27 | Disposition: A | Discharge status: Home / Self Care | Payer: Medicare Other | Source: Ambulatory Visit | Attending: Radiation Oncology | Admitting: Radiation Oncology

## 2019-11-27 VITALS — BP 146/80 | HR 83 | Temp 98.5°F | Resp 20 | Wt 169.6 lb

## 2019-11-27 DIAGNOSIS — Z801 Family history of malignant neoplasm of trachea, bronchus and lung: Secondary | ICD-10-CM | POA: Diagnosis not present

## 2019-11-27 DIAGNOSIS — Z79899 Other long term (current) drug therapy: Secondary | ICD-10-CM | POA: Diagnosis not present

## 2019-11-27 DIAGNOSIS — E785 Hyperlipidemia, unspecified: Secondary | ICD-10-CM | POA: Diagnosis not present

## 2019-11-27 DIAGNOSIS — E079 Disorder of thyroid, unspecified: Secondary | ICD-10-CM | POA: Diagnosis not present

## 2019-11-27 DIAGNOSIS — M5136 Other intervertebral disc degeneration, lumbar region: Secondary | ICD-10-CM | POA: Insufficient documentation

## 2019-11-27 DIAGNOSIS — Z87891 Personal history of nicotine dependence: Secondary | ICD-10-CM | POA: Diagnosis not present

## 2019-11-27 DIAGNOSIS — K5730 Diverticulosis of large intestine without perforation or abscess without bleeding: Secondary | ICD-10-CM | POA: Diagnosis not present

## 2019-11-27 DIAGNOSIS — C7802 Secondary malignant neoplasm of left lung: Secondary | ICD-10-CM | POA: Diagnosis not present

## 2019-11-27 DIAGNOSIS — C3492 Malignant neoplasm of unspecified part of left bronchus or lung: Secondary | ICD-10-CM

## 2019-11-27 DIAGNOSIS — M4316 Spondylolisthesis, lumbar region: Secondary | ICD-10-CM | POA: Insufficient documentation

## 2019-11-27 DIAGNOSIS — Z7982 Long term (current) use of aspirin: Secondary | ICD-10-CM | POA: Insufficient documentation

## 2019-11-27 DIAGNOSIS — Z803 Family history of malignant neoplasm of breast: Secondary | ICD-10-CM | POA: Diagnosis not present

## 2019-11-27 DIAGNOSIS — I10 Essential (primary) hypertension: Secondary | ICD-10-CM | POA: Insufficient documentation

## 2019-11-27 DIAGNOSIS — F419 Anxiety disorder, unspecified: Secondary | ICD-10-CM | POA: Diagnosis not present

## 2019-11-27 DIAGNOSIS — C3432 Malignant neoplasm of lower lobe, left bronchus or lung: Secondary | ICD-10-CM | POA: Insufficient documentation

## 2019-11-27 NOTE — Patient Instructions (Signed)
Coronavirus (COVID-19) Are you at risk?  Are you at risk for the Coronavirus (COVID-19)?  To be considered HIGH RISK for Coronavirus (COVID-19), you have to meet the following criteria:  . Traveled to [place], [place], [place], [place] or [place]; or in the [place] to [place], [place], [place], or [place]; and have fever, cough, and shortness of breath within the last 2 weeks of travel OR . Been in close contact with a person diagnosed with COVID-19 within the last 2 weeks and have fever, cough, and shortness of breath . IF YOU DO NOT MEET THESE CRITERIA, YOU ARE CONSIDERED LOW RISK FOR COVID-19.  What to do if you are HIGH RISK for COVID-19?  . If you are having a medical emergency, call 911. . Seek medical care right away. Before you go to a doctor's office, urgent care or emergency department, call ahead and tell them about your recent travel, contact with someone diagnosed with COVID-19, and your symptoms. You should receive instructions from your physician's office regarding next steps of care.  . When you arrive at healthcare provider, tell the healthcare staff immediately you have returned from visiting [place], [place], [place], [place] or [place]; or traveled in the [place] to [place], [place], [place], or [place]; in the last two weeks or you have been in close contact with a person diagnosed with COVID-19 in the last 2 weeks.   . Tell the health care staff about your symptoms: fever, cough and shortness of breath. . After you have been seen by a medical provider, you will be either: o Tested for (COVID-19) and discharged home on quarantine except to seek medical care if symptoms worsen, and asked to  - Stay home and avoid contact with others until you get your results (4-5 days)  - Avoid travel on public transportation if possible (such as bus, train, or airplane) or o Sent to the Emergency Department by EMS for evaluation, COVID-19 testing, and possible  admission depending on your condition and test results.  What to do if you are LOW RISK for COVID-19?  Reduce your risk of any infection by using the same precautions used for avoiding the common cold or flu:  . Wash your hands often with soap and warm water for at least 20 seconds.  If soap and water are not readily available, use an alcohol-based hand sanitizer with at least 60% alcohol.  . If coughing or sneezing, cover your mouth and nose by coughing or sneezing into the elbow areas of your shirt or coat, into a tissue or into your sleeve (not your hands). . Avoid shaking hands with others and consider head nods or verbal greetings only. . Avoid touching your eyes, nose, or mouth with unwashed hands.  . Avoid close contact with people who are sick. . Avoid places or events with large numbers of people in one location, like concerts or sporting events. . Carefully consider travel plans you have or are making. . If you are planning any travel outside or inside the [place], visit the CDC's Travelers' Health webpage for the latest health notices. . If you have some symptoms but not all symptoms, continue to monitor at home and seek medical attention if your symptoms worsen. . If you are having a medical emergency, call 911.   ADDITIONAL HEALTHCARE OPTIONS FOR PATIENTS  [facility] / e-Visit: [email or web address]         [facility]: [phone]  [facility]: [phone]                   [facility]: [phone]   

## 2019-11-27 NOTE — Progress Notes (Signed)
Radiation Oncology         (336) [phone] ________________________________  Initial Outpatient Consultation  Name: Elizabeth [name] MRN: [id number]  Date: 11/27/2019  DOB: 21-Nov-1945  CC:[name], MD  Curt Bears, MD   REFERRING PHYSICIAN: Curt Bears, MD  DIAGNOSIS: The encounter diagnosis was Adenocarcinoma of left lung, stage 4 ([place]).  Stage IV (T2a, N0, M1a) non-small cell lung cancer, adenocarcinoma with positive EGFR mutation with deletion in exon 19 diagnosed in June 2016 and presented with large mass in the left lower lobe in addition to multiple bilateral pulmonary nodules, now with oligometastasis  HISTORY OF PRESENT ILLNESS::Elizabeth [name] is a 74 y.o. female who is unaccompanied today. The patient was initially diagnosed in 2016. In summary, she was being worked up for liver lesions when an abdomen MRI on 03/09/15 showed incidental bilateral pulmonary nodules with a mass-like lesion in the left lower lobe. PET scan on 03/25/15 showed: diffuse metastatic disease with a 4 cm dominant LLL lesion; no enlarged or hypermetabolic mediastinal or hilar lymphadenopathy; no primary neoplasm in neck, abdomen, or pelvis; no findings for osseous metastatic disease. Biopsy of the LLL performed on 04/01/15 confirmed adenocarcinoma consistent with lung primary. Molecular studies performed on the biopsy showed an EGFR mutation with deletion in exon 19. Her case was discussed in the multidisciplinary thoracic oncology conference [facility]) on 04/15/15 and the consensus was to treat her with Gilotrif. She started treatment on 05/11/2015 under the care of Dr. Julien Nordmann and has continued on treatment since that time. Of note, her dose was lowered in 02/2016 due to extensive skin rash. She has otherwise tolerated it well.   On recent restaging scans performed on 11/18/2019, chest/abdomen/pelvis CT revealed: enlarging LUL pulmonary nodule, suspicious for bronchogenic carcinoma; remaining ground-glass and  part-solid nodules in both lungs are unchanged; no evidence of metastatic disease in the abdomen or pelvis.  Per Dr. Worthy Flank last note from 11/25/2019, he recommends continuing on Gilotrif, due to her good tolerance and response, and adding SBRT to the single enlarging LUL nodule.   PREVIOUS RADIATION THERAPY: No  PAST MEDICAL HISTORY:  Past Medical History:  Diagnosis Date  . Adenocarcinoma of left lung, stage 4 ([facility]) 04/05/2015   Biopsy confirmed CT SCAN: There are innumerable bilateral pulmonary nodules. These range in size from about 5 mm to 2 cm. They demonstrate irregular indistinct borders, the larger ones demonstrating spiculation. PET SCAN: Diffuse pulmonary metastatic disease with a 4 cm dominant left lower low lung lesion. No enlarged or hypermetabolic mediastinal or hilar adenopathy.  . Allergy   . Anxiety   . Cancer ([place])   . Change of skin related to chemotherapy 09/25/2017  . Drug-induced skin rash 01/17/2016  . Hyperlipidemia   . Hypertension   . Insomnia   . Prediabetes   . Thyroid disease     PAST SURGICAL HISTORY: Past Surgical History:  Procedure Laterality Date  . ABDOMINAL HYSTERECTOMY  1995   w BSO  . TONSILLECTOMY AND ADENOIDECTOMY      FAMILY HISTORY:  Family History  Problem Relation Age of Onset  . Liver disease Mother   . Alcohol abuse Mother   . ALS Father   . Hypertension Father   . Dementia Maternal Grandmother 7  . Lung disease Maternal Grandfather   . Lung cancer Paternal Grandmother 46  . Alcohol abuse Paternal Grandfather   . Heart disease Paternal Grandfather   . Melanoma Paternal Aunt   . Bone cancer Maternal Uncle   . Dementia Paternal  Aunt 90  . Breast cancer Cousin     SOCIAL HISTORY:  Social History   Tobacco Use  . Smoking status: Former Smoker    Packs/day: 0.50    Years: 10.00    Pack years: 5.00    Quit date: 10/23/1974    Years since quitting: 45.1  . Smokeless tobacco: Never Used  Substance Use Topics  . Alcohol  use: No    Comment: Rare  . Drug use: No    ALLERGIES:  Allergies  Allergen Reactions  . Penicillins Swelling and Rash    MEDICATIONS:  Current Outpatient Medications  Medication Sig Dispense Refill  . ALPRAZolam (XANAX) 1 MG tablet Take 1/2-1 tablet 2 - 3 x /day ONLY if needed for Panic or Anxiety Attack &  limit to 5 days /week to avoid Addiction & Dementia 90 tablet 0  . aspirin 81 MG tablet Take 81 mg by mouth at bedtime.     . Cholecalciferol (VITAMIN D3) 10000 UNITS TABS Take 10,000 Units by mouth 3 (three) times a week.     . Cyanocobalamin (VITAMIN B 12 PO) Take 1 tablet by mouth daily.    Marland Kitchen GILOTRIF 30 MG tablet TAKE 1 TABLET (30MG) BY MOUTH ONCE DAILY. TAKE ON AN EMPTY STOMACH 1 HOUR BEFORE OR 2 HOURS AFTER A MEAL 30 tablet 2  . ibuprofen (ADVIL,MOTRIN) 200 MG tablet Take 200 mg by mouth every 6 (six) hours as needed.    Marland Kitchen ipratropium (ATROVENT) 0.03 % nasal spray U 2 SPRAYS IEN TID    . levothyroxine (SYNTHROID) 50 MCG tablet Take 1 tablet daily on an empty stomach with only water for 30 minutes & no Antacid meds, Calcium or Magnesium for 4 hours & avoid Biotin 90 tablet 1  . Melatonin 10 MG CAPS Take 1 capsule by mouth at bedtime as needed.    . montelukast (SINGULAIR) 10 MG tablet Take 1 tablet Daily for Allergies 90 tablet 3  . mupirocin ointment (BACTROBAN) 2 % Place 1 application in the nose 2 x /day 22 g 3   No current facility-administered medications for this encounter.    REVIEW OF SYSTEMS:  A 10+ POINT REVIEW OF SYSTEMS WAS OBTAINED including neurology, dermatology, psychiatry, cardiac, respiratory, lymph, extremities, GI, GU, musculoskeletal, constitutional, reproductive, HEENT. She has denied chest pain, shortness of breath, hemoptysis, fever or chills, nausea or vomiting, diarrhea or constipation, and headache or visual changes. She does report a chronic cough, which she describes as "a little cough that I've had forever, could be sinus drainage or a habit."     PHYSICAL EXAM:  weight is 169 lb 9.6 oz (76.9 kg). Her temperature is 98.5 F (36.9 C). Her blood pressure is 146/80 (abnormal) and her pulse is 83. Her respiration is 20 and oxygen saturation is 99%.   General: Alert and oriented, in no acute distress HEENT: Head is normocephalic. Extraocular movements are intact.  Neck: Neck is supple, no palpable cervical or supraclavicular lymphadenopathy. Heart: Regular in rate and rhythm with no murmurs, rubs, or gallops. Chest: Clear to auscultation bilaterally, with no rhonchi, wheezes, or rales. Abdomen: Soft, nontender, nondistended, with no rigidity or guarding. Extremities: No cyanosis or edema. Lymphatics: see Neck Exam Skin: No concerning lesions. Musculoskeletal: symmetric strength and muscle tone throughout. Neurologic: Cranial nerves II through XII are grossly intact. No obvious focalities. Speech is fluent. Coordination is intact. Psychiatric: Judgment and insight are intact. Affect is appropriate.  ECOG = 1  0 - Asymptomatic (Fully active, able  to carry on all predisease activities without restriction)  1 - Symptomatic but completely ambulatory (Restricted in physically strenuous activity but ambulatory and able to carry out work of a light or sedentary nature. For example, light housework, office work)  2 - Symptomatic, <50% in bed during the day (Ambulatory and capable of all self care but unable to carry out any work activities. Up and about more than 50% of waking hours)  3 - Symptomatic, >50% in bed, but not bedbound (Capable of only limited self-care, confined to bed or chair 50% or more of waking hours)  4 - Bedbound (Completely disabled. Cannot carry on any self-care. Totally confined to bed or chair)  5 - Death   Eustace Pen MM, Creech RH, Tormey DC, et al. [phone]). "Toxicity and response criteria of the [facility]". [place] Oncol. 5 (6): 649-55  LABORATORY DATA:  Lab Results  Component Value Date    WBC 6.2 11/18/2019   HGB 12.5 11/18/2019   HCT 39.0 11/18/2019   MCV 86.5 11/18/2019   PLT 196 11/18/2019   NEUTROABS 3.9 11/18/2019   Lab Results  Component Value Date   NA 141 11/18/2019   K 4.3 11/18/2019   CL 105 11/18/2019   CO2 27 11/18/2019   GLUCOSE 88 11/18/2019   CREATININE 1.00 11/18/2019   CALCIUM 8.8 (L) 11/18/2019      RADIOGRAPHY: CT Chest W Contrast  Result Date: 11/18/2019 CLINICAL DATA:  Restaging non-small cell lung cancer. Oral chemotherapy ongoing. EXAM: CT CHEST, ABDOMEN, AND PELVIS WITH CONTRAST TECHNIQUE: Multidetector CT imaging of the chest, abdomen and pelvis was performed following the standard protocol during bolus administration of intravenous contrast. CONTRAST:  153m OMNIPAQUE IOHEXOL 300 MG/ML  SOLN COMPARISON:  Prior studies 07/18/2019 and 03/21/2019. FINDINGS: CT CHEST FINDINGS Cardiovascular: Minimal aortic atherosclerosis. No acute vascular findings. The heart size is normal. There is no pericardial effusion. Mediastinum/Nodes: There are no enlarged mediastinal, hilar or axillary lymph nodes. Stable 5 mm left thyroid nodule on image 9/2, not requiring additional evaluation. Stable small hiatal hernia. Lungs/Pleura: There is no pleural effusion or pneumothorax. Post treatment fibrosis with associated volume loss and bronchiectasis in the superior segment of the left lower lobe appears stable. Multiple ground-glass and part solid nodules are present in both lungs. Most of these are unchanged. However, there is one in the left upper lobe which shows progressive enlargement, measuring 12 x 7 mm on image 63/4 (previously 9 x 9 mm). No other new or enlarging pulmonary nodules. Musculoskeletal/Chest wall: No chest wall mass or suspicious osseous findings. CT ABDOMEN AND PELVIS FINDINGS Hepatobiliary: Stable 4.0 cm hemangioma posteriorly in the right hepatic lobe on image 48/2. No new or enlarging hepatic lesions. No evidence of gallstones, gallbladder wall  thickening or biliary dilatation. Pancreas: Unremarkable. No pancreatic ductal dilatation or surrounding inflammatory changes. Spleen: Normal in size without focal abnormality. Adrenals/Urinary Tract: Both adrenal glands appear normal. The kidneys appear normal without evidence of urinary tract calculus, suspicious lesion or hydronephrosis. No bladder abnormalities are seen. Stomach/Bowel: No evidence of bowel wall thickening, distention or surrounding inflammatory change. Distal colonic diverticulosis. Vascular/Lymphatic: There are no enlarged abdominal or pelvic lymph nodes. Mild aortic and branch vessel atherosclerosis. No acute vascular findings. Reproductive: Hysterectomy. No adnexal mass. Probable mild pelvic floor laxity. Other: No evidence of abdominal wall mass or hernia. No ascites. Musculoskeletal: No acute or significant osseous findings. Stable degenerative disc disease in the lower lumbar spine with a grade 1 anterolisthesis at L4-5  contributing to mild biforaminal narrowing. IMPRESSION: 1. Enlarging left upper lobe pulmonary nodule, suspicious for bronchogenic carcinoma. Consider PET-CT for further evaluation, versus continued close CT follow-up. 2. The remaining ground-glass and part solid nodules in both lungs are unchanged. 3. No evidence of metastatic disease in the abdomen or pelvis. 4. Stable hepatic hemangioma. Electronically Signed   By: Richardean Sale M.D.   On: 11/18/2019 12:37   CT Abdomen Pelvis W Contrast  Result Date: 11/18/2019 CLINICAL DATA:  Restaging non-small cell lung cancer. Oral chemotherapy ongoing. EXAM: CT CHEST, ABDOMEN, AND PELVIS WITH CONTRAST TECHNIQUE: Multidetector CT imaging of the chest, abdomen and pelvis was performed following the standard protocol during bolus administration of intravenous contrast. CONTRAST:  126m OMNIPAQUE IOHEXOL 300 MG/ML  SOLN COMPARISON:  Prior studies 07/18/2019 and 03/21/2019. FINDINGS: CT CHEST FINDINGS Cardiovascular: Minimal aortic  atherosclerosis. No acute vascular findings. The heart size is normal. There is no pericardial effusion. Mediastinum/Nodes: There are no enlarged mediastinal, hilar or axillary lymph nodes. Stable 5 mm left thyroid nodule on image 9/2, not requiring additional evaluation. Stable small hiatal hernia. Lungs/Pleura: There is no pleural effusion or pneumothorax. Post treatment fibrosis with associated volume loss and bronchiectasis in the superior segment of the left lower lobe appears stable. Multiple ground-glass and part solid nodules are present in both lungs. Most of these are unchanged. However, there is one in the left upper lobe which shows progressive enlargement, measuring 12 x 7 mm on image 63/4 (previously 9 x 9 mm). No other new or enlarging pulmonary nodules. Musculoskeletal/Chest wall: No chest wall mass or suspicious osseous findings. CT ABDOMEN AND PELVIS FINDINGS Hepatobiliary: Stable 4.0 cm hemangioma posteriorly in the right hepatic lobe on image 48/2. No new or enlarging hepatic lesions. No evidence of gallstones, gallbladder wall thickening or biliary dilatation. Pancreas: Unremarkable. No pancreatic ductal dilatation or surrounding inflammatory changes. Spleen: Normal in size without focal abnormality. Adrenals/Urinary Tract: Both adrenal glands appear normal. The kidneys appear normal without evidence of urinary tract calculus, suspicious lesion or hydronephrosis. No bladder abnormalities are seen. Stomach/Bowel: No evidence of bowel wall thickening, distention or surrounding inflammatory change. Distal colonic diverticulosis. Vascular/Lymphatic: There are no enlarged abdominal or pelvic lymph nodes. Mild aortic and branch vessel atherosclerosis. No acute vascular findings. Reproductive: Hysterectomy. No adnexal mass. Probable mild pelvic floor laxity. Other: No evidence of abdominal wall mass or hernia. No ascites. Musculoskeletal: No acute or significant osseous findings. Stable degenerative  disc disease in the lower lumbar spine with a grade 1 anterolisthesis at L4-5 contributing to mild biforaminal narrowing. IMPRESSION: 1. Enlarging left upper lobe pulmonary nodule, suspicious for bronchogenic carcinoma. Consider PET-CT for further evaluation, versus continued close CT follow-up. 2. The remaining ground-glass and part solid nodules in both lungs are unchanged. 3. No evidence of metastatic disease in the abdomen or pelvis. 4. Stable hepatic hemangioma. Electronically Signed   By: WRichardean SaleM.D.   On: 11/18/2019 12:37      IMPRESSION: Stage IV (T2a, N0, M1a) non-small cell lung cancer, adenocarcinoma, now with oligo metastasis in the left upper lung  Patient would be a good candidate for stereotactic body radiation therapy directed at this enlarging nodule as this is only area of active disease.  I discussed the course of treatment side effects and potential toxicities of stereotactic body radiation therapy with the patient.  She appears to understand and wishes to proceed with planned course of treatment.   PLAN: Patient will return on February 11 for stereotactic body radiation therapy  planning with treatments to begin the week of February 15.  Anticipate 3 stereotactic body radiation therapy treatments.    ------------------------------------------------  Blair Promise, PhD, MD  This document serves as a record of services personally performed by Gery Pray, MD. It was created on his behalf by Wilburn Mylar, a trained medical scribe. The creation of this record is based on the scribe's personal observations and the provider's statements to them. This document has been checked and approved by the attending provider.

## 2019-12-01 MED FILL — GILOTRIF 30 MG TABLET: 30 | 30 days supply | Qty: 30 | Fill #1

## 2019-12-03 ENCOUNTER — Other Ambulatory Visit: Payer: Self-pay | Admitting: Internal Medicine

## 2019-12-03 MED ORDER — GABAPENTIN 300 MG PO CAPS: ORAL_CAPSULE | ORAL | 0 refills | Status: DC

## 2019-12-04 ENCOUNTER — Ambulatory Visit
Admission: RE | Admit: 2019-12-04 | Discharge: 2019-12-04 | Disposition: A | Discharge status: Home / Self Care | Payer: Medicare Other | Source: Ambulatory Visit | Attending: Radiation Oncology | Admitting: Radiation Oncology

## 2019-12-04 ENCOUNTER — Other Ambulatory Visit: Payer: Self-pay

## 2019-12-04 DIAGNOSIS — C3492 Malignant neoplasm of unspecified part of left bronchus or lung: Secondary | ICD-10-CM

## 2019-12-04 DIAGNOSIS — Z510 Encounter for antineoplastic radiation therapy: Secondary | ICD-10-CM | POA: Diagnosis not present

## 2019-12-04 DIAGNOSIS — C3432 Malignant neoplasm of lower lobe, left bronchus or lung: Secondary | ICD-10-CM | POA: Insufficient documentation

## 2019-12-04 NOTE — Progress Notes (Signed)
  Radiation Oncology         [phone]) [phone] ________________________________  Name: Elizabeth [name] MRN: [id number]  Date: 12/04/2019  DOB: [birth year]   STEREOTACTIC BODY RADIOTHERAPY SIMULATION AND TREATMENT PLANNING NOTE    DIAGNOSIS: Stage IV (T2a, N0, M1a) non-small cell lung cancer, adenocarcinoma with positive EGFR mutation with deletion in exon 19 diagnosed in June 2016 and presented with large mass in the left lower lobe in addition to multiple bilateral pulmonary nodules, now with oligometastasis in the left lung   NARRATIVE:  The patient was brought to the [facility].  Identity was confirmed.  All relevant records and images related to the planned course of therapy were reviewed.  The patient freely provided informed written consent to proceed with treatment after reviewing the details related to the planned course of therapy. The consent form was witnessed and verified by the simulation staff.  Then, the patient was set-up in a stable reproducible  supine position for radiation therapy.  A BodyFix immobilization pillow was fabricated for reproducible positioning.  Then I personally applied the abdominal compression paddle to limit respiratory excursion.  4D respiratoy motion management CT images were obtained.  Surface markings were placed.  The CT images were loaded into the planning software.  Then, using Cine, MIP, and standard views, the internal target volume (ITV) and planning target volumes (PTV) were delinieated, and avoidance structures were contoured.  Treatment planning then occurred.  The radiation prescription was entered and confirmed.  A total of two complex treatment devices were fabricated in the form of the BodyFix immobilization pillow and a neck accuform cushion.  I have requested : 3D Simulation  I have requested a DVH of the following structures: Heart, Lungs, Esophagus, Chest Wall, Brachial Plexus, Major Blood Vessels, and targets.  PLAN:  The patient  will receive 54 Gy in 3 fractions.  -----------------------------------  Blair Promise, PhD, MD

## 2019-12-11 ENCOUNTER — Ambulatory Visit
Admission: RE | Admit: 2019-12-11 | Discharge: 2019-12-11 | Disposition: A | Discharge status: Home / Self Care | Payer: Medicare Other | Source: Ambulatory Visit | Attending: Radiation Oncology | Admitting: Radiation Oncology

## 2019-12-11 ENCOUNTER — Other Ambulatory Visit: Payer: Self-pay

## 2019-12-11 DIAGNOSIS — C3492 Malignant neoplasm of unspecified part of left bronchus or lung: Secondary | ICD-10-CM

## 2019-12-11 DIAGNOSIS — Z510 Encounter for antineoplastic radiation therapy: Secondary | ICD-10-CM | POA: Diagnosis not present

## 2019-12-11 NOTE — Progress Notes (Signed)
  Radiation Oncology         (336) [phone] ________________________________  Name: Elizabeth [name] MRN: [id number]  Date: 12/11/2019  DOB: 06/25/1946  Stereotactic Body Radiotherapy Treatment Procedure Note  NARRATIVE:  Elizabeth [name] was brought to the stereotactic radiation treatment machine and placed supine on the CT couch. The patient was set up for stereotactic body radiotherapy on the body fix pillow.  3D TREATMENT PLANNING AND DOSIMETRY:  The patient's radiation plan was reviewed and approved prior to starting treatment.  It showed 3-dimensional radiation distributions overlaid onto the planning CT.  The [facility] for the target structures as well as the organs at risk were reviewed. The documentation of this is filed in the radiation oncology EMR.  SIMULATION VERIFICATION:  The patient underwent CT imaging on the treatment unit.  These were carefully aligned to document that the ablative radiation dose would cover the target volume and maximally spare the nearby organs at risk according to the planned distribution.  SPECIAL TREATMENT PROCEDURE: Elizabeth [name] received high dose ablative stereotactic body radiotherapy to the planned target volume without unforeseen complications. Treatment was delivered uneventfully. The high doses associated with stereotactic body radiotherapy and the significant potential risks require careful treatment set up and patient monitoring constituting a special treatment procedure   STEREOTACTIC TREATMENT MANAGEMENT:  Following delivery, the patient was evaluated clinically. The patient tolerated treatment without significant acute effects, and was discharged to home in stable condition.    PLAN: Continue treatment as planned.  ________________________________  Blair Promise, PhD, MD   This document serves as a record of services personally performed by Gery Pray, MD. It was created on his behalf by Wilburn Mylar, a trained medical scribe. The  creation of this record is based on the scribe's personal observations and the provider's statements to them. This document has been checked and approved by the attending provider.

## 2019-12-14 NOTE — Progress Notes (Signed)
  Radiation Oncology         [phone]) [phone] ________________________________  Name: Elizabeth [name] MRN: [id number]  Date: 12/16/2019  DOB: 1945/11/02  Stereotactic Body Radiotherapy Treatment Procedure Note  NARRATIVE:  Elizabeth [name] was brought to the stereotactic radiation treatment machine and placed supine on the CT couch. The patient was set up for stereotactic body radiotherapy on the body fix pillow.  3D TREATMENT PLANNING AND DOSIMETRY:  The patient's radiation plan was reviewed and approved prior to starting treatment.  It showed 3-dimensional radiation distributions overlaid onto the planning CT.  The [facility] for the target structures as well as the organs at risk were reviewed. The documentation of this is filed in the radiation oncology EMR.  SIMULATION VERIFICATION:  The patient underwent CT imaging on the treatment unit.  These were carefully aligned to document that the ablative radiation dose would cover the target volume and maximally spare the nearby organs at risk according to the planned distribution.  SPECIAL TREATMENT PROCEDURE: Elizabeth [name] received high dose ablative stereotactic body radiotherapy to the planned target volume without unforeseen complications. Treatment was delivered uneventfully. The high doses associated with stereotactic body radiotherapy and the significant potential risks require careful treatment set up and patient monitoring constituting a special treatment procedure   STEREOTACTIC TREATMENT MANAGEMENT:  Following delivery, the patient was evaluated clinically. The patient tolerated treatment without significant acute effects, and was discharged to home in stable condition.    PLAN: Continue treatment as planned.  ________________________________  Blair Promise, PhD, MD  This document serves as a record of services personally performed by Gery Pray, MD. It was created on his behalf by Clerance Lav, a trained medical scribe. The creation of  this record is based on the scribe's personal observations and the provider's statements to them. This document has been checked and approved by the attending provider.

## 2019-12-16 ENCOUNTER — Other Ambulatory Visit: Payer: Self-pay

## 2019-12-16 ENCOUNTER — Ambulatory Visit
Admission: RE | Admit: 2019-12-16 | Discharge: 2019-12-16 | Disposition: A | Discharge status: Home / Self Care | Payer: Medicare Other | Source: Ambulatory Visit | Attending: Radiation Oncology | Admitting: Radiation Oncology

## 2019-12-16 DIAGNOSIS — C7802 Secondary malignant neoplasm of left lung: Secondary | ICD-10-CM | POA: Insufficient documentation

## 2019-12-16 DIAGNOSIS — Z510 Encounter for antineoplastic radiation therapy: Secondary | ICD-10-CM | POA: Diagnosis not present

## 2019-12-18 ENCOUNTER — Ambulatory Visit
Admission: RE | Admit: 2019-12-18 | Discharge: 2019-12-18 | Disposition: A | Discharge status: Home / Self Care | Payer: Medicare Other | Source: Ambulatory Visit | Attending: Radiation Oncology | Admitting: Radiation Oncology

## 2019-12-18 ENCOUNTER — Other Ambulatory Visit: Payer: Self-pay

## 2019-12-18 ENCOUNTER — Encounter: Payer: Self-pay | Admitting: Radiation Oncology

## 2019-12-18 DIAGNOSIS — Z923 Personal history of irradiation: Secondary | ICD-10-CM

## 2019-12-18 DIAGNOSIS — C7802 Secondary malignant neoplasm of left lung: Secondary | ICD-10-CM

## 2019-12-18 DIAGNOSIS — Z510 Encounter for antineoplastic radiation therapy: Secondary | ICD-10-CM | POA: Diagnosis not present

## 2019-12-18 HISTORY — DX: Personal history of irradiation: Z92.3

## 2019-12-18 NOTE — Progress Notes (Signed)
  Radiation Oncology         [phone]) [phone] ________________________________  Name: Elizabeth [name] MRN: [id number]  Date: 12/18/2019  DOB: 04-Apr-1946  Stereotactic Body Radiotherapy Treatment Procedure Note  NARRATIVE:  EVANGALINE JOU was brought to the stereotactic radiation treatment machine and placed supine on the CT couch. The patient was set up for stereotactic body radiotherapy on the body fix pillow.  3D TREATMENT PLANNING AND DOSIMETRY:  The patient's radiation plan was reviewed and approved prior to starting treatment.  It showed 3-dimensional radiation distributions overlaid onto the planning CT.  The [facility] for the target structures as well as the organs at risk were reviewed. The documentation of this is filed in the radiation oncology EMR.  SIMULATION VERIFICATION:  The patient underwent CT imaging on the treatment unit.  These were carefully aligned to document that the ablative radiation dose would cover the target volume and maximally spare the nearby organs at risk according to the planned distribution.  SPECIAL TREATMENT PROCEDURE: Richarda Osmond received high dose ablative stereotactic body radiotherapy to the planned target volume without unforeseen complications. Treatment was delivered uneventfully. The high doses associated with stereotactic body radiotherapy and the significant potential risks require careful treatment set up and patient monitoring constituting a special treatment procedure   STEREOTACTIC TREATMENT MANAGEMENT:  Following delivery, the patient was evaluated clinically. The patient tolerated treatment without significant acute effects, and was discharged to home in stable condition.    PLAN: Patient has completed her SBRT treatment. She will return for follow up in 1 month.  ________________________________  Blair Promise, PhD, MD  This document serves as a record of services personally performed by Gery Pray, MD. It was created on his behalf by  Wilburn Mylar, a trained medical scribe. The creation of this record is based on the scribe's personal observations and the provider's statements to them. This document has been checked and approved by the attending provider.

## 2020-01-01 MED FILL — GILOTRIF 30 MG TABLET: 30 | 30 days supply | Qty: 30 | Fill #2

## 2020-01-07 NOTE — Progress Notes (Incomplete)
  Patient Name: Elizabeth [name] MRN: [id number] DOB: 1946-05-21 Referring Physician: [name] (Profile Not Attached) Date of Service: 12/18/2019 [facility] [place], [place]                                                        End Of Treatment Note  Diagnoses: C78.02-Secondary malignant neoplasm of left lung  Cancer Staging: Stage IV (T2a, N0, M1a) non-small cell lung cancer, adenocarcinoma with positive EGFR mutation with deletion in exon 19 diagnosed in June 2016 and presented with large mass in the left lower lobe in addition to multiple bilateral pulmonary nodules, now with oligometastasis  Intent: Curative  Radiation Treatment Dates: 12/11/2019 through 12/18/2019 Site Technique Total Dose (Gy) Dose per Fx (Gy) Completed Fx Beam Energies  Lung, Left: Lung_Lt IMRT 54/54 18 3/3 6XFFF   Narrative: The patient tolerated radiation therapy relatively well. She only reported a cough that lasted 36 hours after treatment and then spontaneously resolved. She denied fatigue and any other symptoms.  Plan: The patient will follow-up with radiation oncology in one month.  ________________________________________________   Blair Promise, PhD, MD  This document serves as a record of services personally performed by Gery Pray, MD. It was created on his behalf by Clerance Lav, a trained medical scribe. The creation of this record is based on the scribe's personal observations and the provider's statements to them. This document has been checked and approved by the attending provider.

## 2020-01-08 ENCOUNTER — Encounter: Payer: Self-pay | Admitting: Radiation Oncology

## 2020-01-09 ENCOUNTER — Other Ambulatory Visit: Payer: Self-pay | Admitting: Internal Medicine

## 2020-01-09 ENCOUNTER — Encounter: Payer: Self-pay | Admitting: Internal Medicine

## 2020-01-09 MED ORDER — PROMETHAZINE-CODEINE 6.25-10 MG/5ML PO SYRP: ORAL_SOLUTION | ORAL | 1 refills | Status: DC

## 2020-01-12 ENCOUNTER — Other Ambulatory Visit: Payer: Self-pay | Admitting: Internal Medicine

## 2020-01-12 ENCOUNTER — Telehealth: Payer: Self-pay

## 2020-01-12 MED ORDER — GABAPENTIN 300 MG PO CAPS: ORAL_CAPSULE | ORAL | 1 refills | Status: DC

## 2020-01-12 NOTE — Telephone Encounter (Signed)
-----   Message from Heywood Footman, RN sent at 01/12/2020  3:02 PM EDT ----- Regarding: FW: Requesting something for her cough Sharee Pimple. Diane requested I forward this information on to you. Sam ----- Message ----- From: Ardeen Garland, RN Sent: 01/12/2020   2:21 PM EDT To: Gery Pray, MD, Heywood Footman, RN Subject: Requesting something for her cough             Sam, Can you get this to Dr Clabe Seal nurse . Not sure Sharee Pimple is still there.    [facility] On[facility] Nurse Cc Phone Number: [phone] Thanks Diane. I sent Dr. Sondra Come a message yesterday but since I had not heard from him .Thanks.  Christinamarie

## 2020-01-12 NOTE — Telephone Encounter (Signed)
Left VM for pt. PCP sent script for cough syrup on 3/19. This RN's direct number left for return call re: if prescribed syrup is adequate or if patient would like a different cough suppressant. Await call.   Loma Sousa, RN BSN

## 2020-01-13 ENCOUNTER — Other Ambulatory Visit: Payer: Self-pay | Admitting: Radiation Oncology

## 2020-01-15 ENCOUNTER — Encounter: Payer: Self-pay | Admitting: Radiation Oncology

## 2020-01-15 ENCOUNTER — Other Ambulatory Visit: Payer: Self-pay | Admitting: Internal Medicine

## 2020-01-15 ENCOUNTER — Other Ambulatory Visit: Payer: Self-pay

## 2020-01-15 ENCOUNTER — Ambulatory Visit
Admission: RE | Admit: 2020-01-15 | Discharge: 2020-01-15 | Disposition: A | Discharge status: Home / Self Care | Payer: Medicare Other | Source: Ambulatory Visit | Attending: Radiation Oncology | Admitting: Radiation Oncology

## 2020-01-15 VITALS — BP 157/94 | HR 74 | Temp 97.8°F | Resp 18 | Ht 59.0 in | Wt 170.2 lb

## 2020-01-15 DIAGNOSIS — R0602 Shortness of breath: Secondary | ICD-10-CM | POA: Diagnosis not present

## 2020-01-15 DIAGNOSIS — Z79899 Other long term (current) drug therapy: Secondary | ICD-10-CM | POA: Insufficient documentation

## 2020-01-15 DIAGNOSIS — Z7982 Long term (current) use of aspirin: Secondary | ICD-10-CM | POA: Diagnosis not present

## 2020-01-15 DIAGNOSIS — C7802 Secondary malignant neoplasm of left lung: Secondary | ICD-10-CM | POA: Insufficient documentation

## 2020-01-15 DIAGNOSIS — R05 Cough: Secondary | ICD-10-CM | POA: Diagnosis not present

## 2020-01-15 DIAGNOSIS — Z923 Personal history of irradiation: Secondary | ICD-10-CM | POA: Diagnosis not present

## 2020-01-15 DIAGNOSIS — C3432 Malignant neoplasm of lower lobe, left bronchus or lung: Secondary | ICD-10-CM | POA: Diagnosis not present

## 2020-01-15 NOTE — Patient Instructions (Signed)
Coronavirus (COVID-19) Are you at risk?  Are you at risk for the Coronavirus (COVID-19)?  To be considered HIGH RISK for Coronavirus (COVID-19), you have to meet the following criteria:  . Traveled to [place], [place], [place], [place] or [place]; or in the [place] to [place], [place], [place], or [place]; and have fever, cough, and shortness of breath within the last 2 weeks of travel OR . Been in close contact with a person diagnosed with COVID-19 within the last 2 weeks and have fever, cough, and shortness of breath . IF YOU DO NOT MEET THESE CRITERIA, YOU ARE CONSIDERED LOW RISK FOR COVID-19.  What to do if you are HIGH RISK for COVID-19?  . If you are having a medical emergency, call 911. . Seek medical care right away. Before you go to a doctor's office, urgent care or emergency department, call ahead and tell them about your recent travel, contact with someone diagnosed with COVID-19, and your symptoms. You should receive instructions from your physician's office regarding next steps of care.  . When you arrive at healthcare provider, tell the healthcare staff immediately you have returned from visiting [place], [place], [place], [place] or [place]; or traveled in the [place] to [place], [place], [place], or [place]; in the last two weeks or you have been in close contact with a person diagnosed with COVID-19 in the last 2 weeks.   . Tell the health care staff about your symptoms: fever, cough and shortness of breath. . After you have been seen by a medical provider, you will be either: o Tested for (COVID-19) and discharged home on quarantine except to seek medical care if symptoms worsen, and asked to  - Stay home and avoid contact with others until you get your results (4-5 days)  - Avoid travel on public transportation if possible (such as bus, train, or airplane) or o Sent to the Emergency Department by EMS for evaluation, COVID-19 testing, and possible  admission depending on your condition and test results.  What to do if you are LOW RISK for COVID-19?  Reduce your risk of any infection by using the same precautions used for avoiding the common cold or flu:  . Wash your hands often with soap and warm water for at least 20 seconds.  If soap and water are not readily available, use an alcohol-based hand sanitizer with at least 60% alcohol.  . If coughing or sneezing, cover your mouth and nose by coughing or sneezing into the elbow areas of your shirt or coat, into a tissue or into your sleeve (not your hands). . Avoid shaking hands with others and consider head nods or verbal greetings only. . Avoid touching your eyes, nose, or mouth with unwashed hands.  . Avoid close contact with people who are sick. . Avoid places or events with large numbers of people in one location, like concerts or sporting events. . Carefully consider travel plans you have or are making. . If you are planning any travel outside or inside the [place], visit the CDC's Travelers' Health webpage for the latest health notices. . If you have some symptoms but not all symptoms, continue to monitor at home and seek medical attention if your symptoms worsen. . If you are having a medical emergency, call 911.   ADDITIONAL HEALTHCARE OPTIONS FOR PATIENTS  [facility] / e-Visit: [email or web address]         [facility]: [phone]  [facility]: [phone]                   [facility]: [phone]   

## 2020-01-15 NOTE — Progress Notes (Signed)
Radiation Oncology         (336) [phone] ________________________________  Name: Elizabeth [name] MRN: [id number]  Date: 01/15/2020  DOB: 06/29/1946  Follow-Up Visit Note  CC: [name], MD  [name], MD    ICD-10-CM   1. Secondary malignant neoplasm of left lung (HCC)  C78.02     Diagnosis:   Stage IV (T2a, N0, M1a) non-small cell lung cancer, adenocarcinomawith positive EGFR mutation with deletion in exon 19 diagnosed in June 2016 and presented with large mass in the left lower lobe in addition to multiple bilateral pulmonary nodules,now with oligometastasis  Interval Since Last Radiation:  1 month  12/11/2019 through 12/18/2019 Site Technique Total Dose (Gy) Dose per Fx (Gy) Completed Fx Beam Energies  Lung, Left: Lung_Lt IMRT 54/54 18 3/3 6XFFF    Narrative:  The patient returns today for routine follow-up. She is not currently scheduled for follow up with Dr. Julien Nordmann. Pt denies c/o pain. Pt reports a cough since XRT completed, and has been using Tessalon pearls until completed and then used cough syrup that PCP called in. Cough is manageable for pt now. Pt denies hemoptysis. Pt denies difficulty swallowing. Pt reports SOB with exertion.    ALLERGIES:  is allergic to penicillins.  Meds: Current Outpatient Medications  Medication Sig Dispense Refill  . ALPRAZolam (XANAX) 1 MG tablet Take 1/2-1 tablet 2 - 3 x /day ONLY if needed for Panic or Anxiety Attack &  limit to 5 days /week to avoid Addiction & Dementia 90 tablet 0  . aspirin 81 MG tablet Take 81 mg by mouth at bedtime.     . Cholecalciferol (VITAMIN D3) 10000 UNITS TABS Take 10,000 Units by mouth 3 (three) times a week.     . Cyanocobalamin (VITAMIN B 12 PO) Take 1 tablet by mouth daily.    Marland Kitchen gabapentin (NEURONTIN) 300 MG capsule Take 1 capsule 3 x /day for Pain 270 capsule 1  . GILOTRIF 30 MG tablet TAKE 1 TABLET (30MG) BY MOUTH ONCE DAILY. TAKE ON AN EMPTY STOMACH 1 HOUR BEFORE OR 2 HOURS AFTER A MEAL  30 tablet 2  . ibuprofen (ADVIL,MOTRIN) 200 MG tablet Take 200 mg by mouth every 6 (six) hours as needed.    Marland Kitchen ipratropium (ATROVENT) 0.03 % nasal spray U 2 SPRAYS IEN TID    . levothyroxine (SYNTHROID) 50 MCG tablet Take 1 tablet daily on an empty stomach with only water for 30 minutes & no Antacid meds, Calcium or Magnesium for 4 hours & avoid Biotin 90 tablet 1  . Melatonin 10 MG CAPS Take 1 capsule by mouth at bedtime as needed.    . montelukast (SINGULAIR) 10 MG tablet Take 1 tablet Daily for Allergies 90 tablet 3  . mupirocin ointment (BACTROBAN) 2 % Place 1 application in the nose 2 x /day 22 g 3  . promethazine-codeine (PHENERGAN WITH CODEINE) 6.25-10 MG/5ML syrup Take 1 or 2 teaspoonful every 4 hours as needed for Cough / Congestion 360 mL 1   No current facility-administered medications for this encounter.    Physical Findings: The patient is in no acute distress. Patient is alert and oriented.  height is '4\' 11"'  (1.499 m) and weight is 170 lb 4 oz (77.2 kg). Her temporal temperature is 97.8 F (36.6 C). Her blood pressure is 157/94 (abnormal) and her pulse is 74. Her respiration is 18 and oxygen saturation is 98%. .  No significant changes. Lungs are clear to auscultation bilaterally. Heart has regular rate  and rhythm. No palpable cervical, supraclavicular, or axillary adenopathy. Abdomen soft, non-tender, normal bowel sounds.   Lab Findings: Lab Results  Component Value Date   WBC 6.2 11/18/2019   HGB 12.5 11/18/2019   HCT 39.0 11/18/2019   MCV 86.5 11/18/2019   PLT 196 11/18/2019    Radiographic Findings: No results found.  Impression:  The patient is recovering from the effects of radiation.  Clinically stable at this time status post stereotactic body radiation therapy.  Cough issues are much better at this time.   Plan: As needed follow-up in radiation oncology.  The patient will continue close follow-up with Dr. Julien Nordmann.  I recommend she follow-up with him in  proximally 3 months for repeat chest CT scan.   ____________________________________   Blair Promise, PhD, MD   This document serves as a record of services personally performed by Gery Pray, MD. It was created on his behalf by Wilburn Mylar, a trained medical scribe. The creation of this record is based on the scribe's personal observations and the provider's statements to them. This document has been checked and approved by the attending provider.

## 2020-01-15 NOTE — Progress Notes (Signed)
Elizabeth [name] presents today for f/u with Dr. Sondra Come. Pt denies c/o pain. Pt reports a cough since XRT completed, and has been using Tessalon pearls until completed and then used cough syrup that PCP called in. Cough is manageable for pt now. Pt denies hemoptysis. Pt denies difficulty swallowing. Pt reports SOB with exertion.  BP (!) 157/94 (BP Location: Left Arm, Patient Position: Sitting)   Pulse 74   Temp 97.8 F (36.6 C) (Temporal)   Resp 18   Ht 4\' 11"  (1.499 m)   Wt 170 lb 4 oz (77.2 kg)   SpO2 98%   BMI 34.39 kg/m   Wt Readings from Last 3 Encounters:  01/15/20 170 lb 4 oz (77.2 kg)  11/27/19 169 lb 9.6 oz (76.9 kg)  11/25/19 169 lb (76.7 kg)   Loma Sousa, RN BSN

## 2020-01-20 ENCOUNTER — Other Ambulatory Visit: Payer: Self-pay | Admitting: Internal Medicine

## 2020-01-28 MED FILL — GILOTRIF 30 MG TABLET: 30 | 30 days supply | Qty: 30 | Fill #0

## 2020-02-26 ENCOUNTER — Encounter: Payer: Self-pay | Admitting: Internal Medicine

## 2020-02-26 ENCOUNTER — Telehealth: Payer: Self-pay | Admitting: Internal Medicine

## 2020-02-26 NOTE — Telephone Encounter (Signed)
Unable to contact pt. Left voicemail- scheduled appt on 5/11. Per 5/6 sch msg

## 2020-03-02 ENCOUNTER — Inpatient Hospital Stay: Payer: Medicare Other | Attending: Internal Medicine | Admitting: Internal Medicine

## 2020-03-02 ENCOUNTER — Inpatient Hospital Stay: Payer: Medicare Other

## 2020-03-02 ENCOUNTER — Encounter: Payer: Self-pay | Admitting: Internal Medicine

## 2020-03-02 ENCOUNTER — Other Ambulatory Visit: Payer: Self-pay

## 2020-03-02 VITALS — BP 142/82 | HR 77 | Temp 97.8°F | Resp 18 | Ht 59.0 in | Wt 165.3 lb

## 2020-03-02 DIAGNOSIS — I10 Essential (primary) hypertension: Secondary | ICD-10-CM

## 2020-03-02 DIAGNOSIS — Z880 Allergy status to penicillin: Secondary | ICD-10-CM | POA: Insufficient documentation

## 2020-03-02 DIAGNOSIS — Z79899 Other long term (current) drug therapy: Secondary | ICD-10-CM | POA: Diagnosis not present

## 2020-03-02 DIAGNOSIS — C3490 Malignant neoplasm of unspecified part of unspecified bronchus or lung: Secondary | ICD-10-CM | POA: Diagnosis not present

## 2020-03-02 DIAGNOSIS — R21 Rash and other nonspecific skin eruption: Secondary | ICD-10-CM | POA: Diagnosis not present

## 2020-03-02 DIAGNOSIS — C3432 Malignant neoplasm of lower lobe, left bronchus or lung: Secondary | ICD-10-CM | POA: Diagnosis present

## 2020-03-02 DIAGNOSIS — C7802 Secondary malignant neoplasm of left lung: Secondary | ICD-10-CM | POA: Diagnosis not present

## 2020-03-02 DIAGNOSIS — C3492 Malignant neoplasm of unspecified part of left bronchus or lung: Secondary | ICD-10-CM

## 2020-03-02 DIAGNOSIS — Z90722 Acquired absence of ovaries, bilateral: Secondary | ICD-10-CM | POA: Insufficient documentation

## 2020-03-02 DIAGNOSIS — Z5111 Encounter for antineoplastic chemotherapy: Secondary | ICD-10-CM

## 2020-03-02 LAB — CMP (CANCER CENTER ONLY)
ALT: 18 U/L (ref 0–44)
AST: 20 U/L (ref 15–41)
Albumin: 3.6 g/dL (ref 3.5–5.0)
Alkaline Phosphatase: 75 U/L (ref 38–126)
Anion gap: 11 (ref 5–15)
BUN: 17 mg/dL (ref 8–23)
CO2: 23 mmol/L (ref 22–32)
Calcium: 9 mg/dL (ref 8.9–10.3)
Chloride: 106 mmol/L (ref 98–111)
Creatinine: 1.06 mg/dL — ABNORMAL HIGH (ref 0.44–1.00)
GFR, Est AFR Am: >60 mL/min (ref >60)
GFR, Estimated: 52 mL/min — ABNORMAL LOW (ref >60)
Glucose, Bld: 81 mg/dL (ref 70–99)
Potassium: 4 mmol/L (ref 3.5–5.1)
Sodium: 140 mmol/L (ref 135–145)
Total Bilirubin: 1.1 mg/dL (ref 0.3–1.2)
Total Protein: 7.1 g/dL (ref 6.5–8.1)

## 2020-03-02 LAB — CBC WITH DIFFERENTIAL (CANCER CENTER ONLY)
Abs Immature Granulocytes: 0.02 10*3/uL (ref 0.00–0.07)
Basophils Absolute: 0.1 10*3/uL (ref 0.0–0.1)
Basophils Relative: 1 %
Eosinophils Absolute: 0.3 10*3/uL (ref 0.0–0.5)
Eosinophils Relative: 4 %
HCT: 38.9 % (ref 36.0–46.0)
Hemoglobin: 12.6 g/dL (ref 12.0–15.0)
Immature Granulocytes: 0 %
Lymphocytes Relative: 18 %
Lymphs Abs: 1.3 10*3/uL (ref 0.7–4.0)
MCH: 27.7 pg (ref 26.0–34.0)
MCHC: 32.4 g/dL (ref 30.0–36.0)
MCV: 85.5 fL (ref 80.0–100.0)
Monocytes Absolute: 0.7 10*3/uL (ref 0.1–1.0)
Monocytes Relative: 11 %
Neutro Abs: 4.6 10*3/uL (ref 1.7–7.7)
Neutrophils Relative %: 66 %
Platelet Count: 205 10*3/uL (ref 150–400)
RBC: 4.55 MIL/uL (ref 3.87–5.11)
RDW: 13 % (ref 11.5–15.5)
WBC Count: 7 10*3/uL (ref 4.0–10.5)
nRBC: 0 % (ref 0.0–0.2)

## 2020-03-02 NOTE — Progress Notes (Signed)
[place] [phone]   [phone]  OFFICE PROGRESS NOTE  [name], MD [address] [place] [address]  DIAGNOSIS: Stage IV (T2a, N0, M1a) non-small cell lung cancer, adenocarcinoma with positive EGFR mutation with deletion in exon 19 diagnosed in June 2016 and presented with large mass in the left lower lobe in addition to multiple bilateral pulmonary nodules  PRIOR THERAPY:  1) Gilotrif 40 mg by mouth daily started 05/11/2015, status post 9 months of treatment discontinued on 02/17/2016 secondary to extensive skin rash. 2) SBRT to the enlarging left upper lobe nodule under the care of Dr. Lisbeth Renshaw.  CURRENT THERAPY: Gilotrif 30 mg by mouth daily started 02/21/2016 status post 47 months of treatment.  INTERVAL HISTORY: Elizabeth [name] 74 y.o. female returns to the clinic today for follow-up visit.  The patient is feeling fine today with no concerning complaints except for occasional nosebleed from crusts in her nose.  The patient denied having any current chest pain, shortness of breath, cough or hemoptysis.  She denied having any fever or chills.  She has no nausea, vomiting, diarrhea or constipation.  She denied having any headache or visual changes.  She continues to tolerate her treatment with Gilotrif fairly well.  The patient is here today for evaluation and repeat blood work.   MEDICAL HISTORY: Past Medical History:  Diagnosis Date  . Adenocarcinoma of left lung, stage 4 ([place]) 04/05/2015   Biopsy confirmed CT SCAN: There are innumerable bilateral pulmonary nodules. These range in size from about 5 mm to 2 cm. They demonstrate irregular indistinct borders, the larger ones demonstrating spiculation. PET SCAN: Diffuse pulmonary metastatic disease with a 4 cm dominant left lower low lung lesion. No enlarged or hypermetabolic mediastinal or hilar adenopathy.  . Allergy   . Anxiety   . Cancer ([place])   . Change of skin  related to chemotherapy 09/25/2017  . Drug-induced skin rash 01/17/2016  . Hyperlipidemia   . Hypertension   . Insomnia   . Prediabetes   . Thyroid disease     ALLERGIES:  is allergic to penicillins.  MEDICATIONS:  Current Outpatient Medications  Medication Sig Dispense Refill  . ALPRAZolam (XANAX) 1 MG tablet TAKE 1/2 TO 1 TABLET BY MOUTH 2-3 TIMES DAILY ONLY IF NEEDED FOR PANIC/ANXIETY. LIMIT TO 5 DAYS PER WEEK TO AVOID ADDICTION/DEMENTIA 90 tablet 0  . aspirin 81 MG tablet Take 81 mg by mouth at bedtime.     . Cholecalciferol (VITAMIN D3) 10000 UNITS TABS Take 10,000 Units by mouth 3 (three) times a week.     . Cyanocobalamin (VITAMIN B 12 PO) Take 1 tablet by mouth daily.    Marland Kitchen gabapentin (NEURONTIN) 300 MG capsule Take 1 capsule 3 x /day for Pain 270 capsule 1  . GILOTRIF 30 MG tablet TAKE 1 TABLET (30MG) BY MOUTH ONCE DAILY. TAKE ON AN EMPTY STOMACH 1 HOUR BEFORE OR 2 HOURS AFTER A MEAL 30 tablet 2  . ibuprofen (ADVIL,MOTRIN) 200 MG tablet Take 200 mg by mouth every 6 (six) hours as needed.    Marland Kitchen ipratropium (ATROVENT) 0.03 % nasal spray U 2 SPRAYS IEN TID    . levothyroxine (SYNTHROID) 50 MCG tablet Take 1 tablet daily on an empty stomach with only water for 30 minutes & no Antacid meds, Calcium or Magnesium for 4 hours & avoid Biotin 90 tablet 1  . Melatonin 10 MG CAPS Take 1 capsule by mouth at bedtime as  needed.    . meloxicam (MOBIC) 15 MG tablet Take 15 mg by mouth daily.    . montelukast (SINGULAIR) 10 MG tablet Take 1 tablet Daily for Allergies 90 tablet 3  . mupirocin ointment (BACTROBAN) 2 % Place 1 application in the nose 2 x /day 22 g 3  . promethazine-codeine (PHENERGAN WITH CODEINE) 6.25-10 MG/5ML syrup Take 1 or 2 teaspoonful every 4 hours as needed for Cough / Congestion 360 mL 1   No current facility-administered medications for this visit.    SURGICAL HISTORY:  Past Surgical History:  Procedure Laterality Date  . ABDOMINAL HYSTERECTOMY  1995   w BSO  .  TONSILLECTOMY AND ADENOIDECTOMY      REVIEW OF SYSTEMS:  A comprehensive review of systems was negative except for: Ears, nose, mouth, throat, and face: positive for epistaxis   PHYSICAL EXAMINATION: General appearance: alert, cooperative and no distress Head: Normocephalic, without obvious abnormality, atraumatic Neck: no adenopathy, no JVD, supple, symmetrical, trachea midline and thyroid not enlarged, symmetric, no tenderness/mass/nodules Lymph nodes: Cervical, supraclavicular, and axillary nodes normal. Resp: clear to auscultation bilaterally Back: symmetric, no curvature. ROM normal. No CVA tenderness. Cardio: regular rate and rhythm, S1, S2 normal, no murmur, click, rub or gallop GI: soft, non-tender; bowel sounds normal; no masses,  no organomegaly Extremities: extremities normal, atraumatic, no cyanosis or edema  ECOG PERFORMANCE STATUS: 1 - Symptomatic but completely ambulatory  Blood pressure (!) 142/82, pulse 77, temperature 97.8 F (36.6 C), temperature source Temporal, resp. rate 18, height 4' 11" (1.499 m), weight 165 lb 4.8 oz (75 kg), SpO2 99 %.  LABORATORY DATA: Lab Results  Component Value Date   WBC 7.0 03/02/2020   HGB 12.6 03/02/2020   HCT 38.9 03/02/2020   MCV 85.5 03/02/2020   PLT 205 03/02/2020      Chemistry      Component Value Date/Time   NA 141 11/18/2019 0931   NA 139 10/26/2017 1252   K 4.3 11/18/2019 0931   K 4.1 10/26/2017 1252   CL 105 11/18/2019 0931   CO2 27 11/18/2019 0931   CO2 28 10/26/2017 1252   BUN 16 11/18/2019 0931   BUN 15.7 10/26/2017 1252   CREATININE 1.00 11/18/2019 0931   CREATININE 1.01 (H) 10/08/2019 1133   CREATININE 1.0 10/26/2017 1252      Component Value Date/Time   CALCIUM 8.8 (L) 11/18/2019 0931   CALCIUM 8.8 10/26/2017 1252   ALKPHOS 75 11/18/2019 0931   ALKPHOS 70 10/26/2017 1252   AST 21 11/18/2019 0931   AST 19 10/26/2017 1252   ALT 21 11/18/2019 0931   ALT 22 10/26/2017 1252   BILITOT 0.8 11/18/2019  0931   BILITOT 0.90 10/26/2017 1252       RADIOGRAPHIC STUDIES: No results found.  ASSESSMENT AND PLAN:  This is a very pleasant 74 years old white female with stage IV non-small cell lung cancer, adenocarcinoma with positive EGFR mutation with deletion in exon 19 and currently undergoing treatment with Gilotrif initially at a dose of 40 mg for 9 months followed by 30 mg by mouth daily status post 47 months.  She continues to tolerate her treatment well with no concerning adverse effects. The patient underwent SBRT to the enlarging left upper lobe nodule. I recommended for her to continue her current treatment with Gilotrif with the same dose. I will see her back for follow-up visit in 1 months for evaluation with repeat CT scan of the chest, abdomen pelvis for restaging of  her disease. For the skin rash she will continue with the clindamycin lotion as needed. For the hypertension, the patient was advised to take her blood pressure medication as prescribed and to monitor it closely at home. She was advised to call immediately if she has any concerning symptoms in the interval. The patient voices understanding of current disease status and treatment options and is in agreement with the current care plan. All questions were answered. The patient knows to call the clinic with any problems, questions or concerns. We can certainly see the patient much sooner if necessary.  Disclaimer: This note was dictated with voice recognition software. Similar sounding words can inadvertently be transcribed and may not be corrected upon review.

## 2020-03-03 ENCOUNTER — Telehealth: Payer: Self-pay | Admitting: Internal Medicine

## 2020-03-03 NOTE — Telephone Encounter (Signed)
Scheduled per los. Called and spoke with patient. Confirmed appt 

## 2020-03-11 NOTE — Progress Notes (Signed)
Assessment and Plan:  Elizabeth [name] was seen today for cloudy urine and sinus problem.  Diagnoses and all orders for this visit:  Urinary frequency Cloudy urine Sx suggestive of UTI, will initiate presumptive treatment Maintain adequate hydration, hygiene reviewed Follow up if symptoms not improving, and as needed. -     Urinalysis, Routine w reflex microscopic -     Urine Culture -     cephALEXin (KEFLEX) 500 MG capsule; Take 1 capsule (500 mg total) by mouth 2 (two) times daily for 7 days.  Acute non-recurrent frontal sinusitis Possible allergic element, however with duration and UTI will treat with abx which may benefit both Suggested symptomatic OTC remedies. Nasal saline spray for congestion. Avoid nasal steroid due to recent nasal ulcers, get on allergy pill, will do oral steroids Follow up as needed. -     predniSONE (DELTASONE) 20 MG tablet; 2 tablets daily for 3 days, 1 tablet daily for 4 days. -     cephALEXin (KEFLEX) 500 MG capsule; Take 1 capsule (500 mg total) by mouth 2 (two) times daily for 7 days.   Further disposition pending results of labs. Discussed med's effects and SE's.   Over 15 minutes of exam, counseling, chart review, and critical decision making was performed.   Future Appointments  Date Time Provider [place]  04/02/2020  9:00 AM [facility] 5 [facility] None  04/02/2020 10:00 AM [facility] [place]  04/05/2020  9:30 AM Curt Bears, MD [facility] None  04/13/2020 11:30 AM [name], NP [facility] None  11/08/2020 11:00 AM [name], MD [facility] None    ------------------------------------------------------------------------------------------------------------------   HPI BP 132/82   Pulse 90   Temp (!) 96.3 F (35.7 C)   Wt 163 lb 9.6 oz (74.2 kg)   SpO2 96%   BMI 33.04 kg/m   74 y.o.female with hx of labile hypertension, stage 4 lung cancer (followed by Dr. Earlie Server, has been doing well on gliotrif)  presents for evaluation of sinus and urinary symptoms.   She reports was at the beach in [place], since then has had sinus symptoms. She reports nasal congestion, recently had nasal discharge with crusting and bleeding (has improved with mupirocin via cancer center), now having sinus pressure and tenderness, sinus headache, post nasal drip with reactive cough, has been taking ibuprofen which does help. Has been taking OTC decongestant pill which has been helping but not resolving. Hasn't been taking antihistamine. She endorses intermittent chills but no fever. Has dyspnea/wheezing consistent with recent baseline due to lung cancer treatment.   She reports 4-5 days of urinary frequency, urgency, cloudy urine; denies dysuria. Admits to poor fluid intake, does some sweet tea, some water at night but feels possible decreased output. Denies back pain, abdominal pain, distension, nausea/vomiting, dizziness. Endorses some fatigue but reports this is ongoing since radiation in Feb 2021. Denies sexual activity, hx of STIs, vaginal discharge.   Last UTI/abx was several years ago. Penicillin allergy, has tolerated keflex in the past.    Lab Results  Component Value Date   GFRNONAA 52 (L) 03/02/2020   GFRNONAA 56 (L) 11/18/2019   GFRNONAA 56 (L) 10/08/2019   Lab Results  Component Value Date   CREATININE 1.06 (H) 03/02/2020   CREATININE 1.00 11/18/2019   CREATININE 1.01 (H) 10/08/2019     Past Medical History:  Diagnosis Date  . Adenocarcinoma of left lung, stage 4 ([place]) 04/05/2015   Biopsy confirmed CT SCAN: There are innumerable bilateral pulmonary nodules. These range in  size from about 5 mm to 2 cm. They demonstrate irregular indistinct borders, the larger ones demonstrating spiculation. PET SCAN: Diffuse pulmonary metastatic disease with a 4 cm dominant left lower low lung lesion. No enlarged or hypermetabolic mediastinal or hilar adenopathy.  . Allergy   . Anxiety   . Cancer ([place])   . Change of  skin related to chemotherapy 09/25/2017  . Drug-induced skin rash 01/17/2016  . Hyperlipidemia   . Hypertension   . Insomnia   . Prediabetes   . Thyroid disease      Allergies  Allergen Reactions  . Penicillins Swelling and Rash    Current Outpatient Medications on File Prior to Visit  Medication Sig  . ALPRAZolam (XANAX) 1 MG tablet TAKE 1/2 TO 1 TABLET BY MOUTH 2-3 TIMES DAILY ONLY IF NEEDED FOR PANIC/ANXIETY. LIMIT TO 5 DAYS PER WEEK TO AVOID ADDICTION/DEMENTIA  . aspirin 81 MG tablet Take 81 mg by mouth at bedtime.   . Cholecalciferol (VITAMIN D3) 10000 UNITS TABS Take 10,000 Units by mouth 3 (three) times a week.   . Cyanocobalamin (VITAMIN B 12 PO) Take 1 tablet by mouth daily.  Marland Kitchen GILOTRIF 30 MG tablet TAKE 1 TABLET (30MG ) BY MOUTH ONCE DAILY. TAKE ON AN EMPTY STOMACH 1 HOUR BEFORE OR 2 HOURS AFTER A MEAL  . ibuprofen (ADVIL,MOTRIN) 200 MG tablet Take 200 mg by mouth every 6 (six) hours as needed.  Marland Kitchen levothyroxine (SYNTHROID) 50 MCG tablet Take 1 tablet daily on an empty stomach with only water for 30 minutes & no Antacid meds, Calcium or Magnesium for 4 hours & avoid Biotin  . Melatonin 10 MG CAPS Take 1 capsule by mouth at bedtime as needed.  . montelukast (SINGULAIR) 10 MG tablet Take 1 tablet Daily for Allergies  . mupirocin ointment (BACTROBAN) 2 % Place 1 application in the nose 2 x /day  . gabapentin (NEURONTIN) 300 MG capsule Take 1 capsule 3 x /day for Pain (Patient not taking: Reported on 03/12/2020)  . ipratropium (ATROVENT) 0.03 % nasal spray U 2 SPRAYS IEN TID  . meloxicam (MOBIC) 15 MG tablet Take 15 mg by mouth daily.  . promethazine-codeine (PHENERGAN WITH CODEINE) 6.25-10 MG/5ML syrup Take 1 or 2 teaspoonful every 4 hours as needed for Cough / Congestion (Patient not taking: Reported on 03/12/2020)   No current facility-administered medications on file prior to visit.    ROS: all negative except above.   Physical Exam:  BP 132/82   Pulse 90   Temp (!) 96.3  F (35.7 C)   Wt 163 lb 9.6 oz (74.2 kg)   SpO2 96%   BMI 33.04 kg/m   General Appearance: Well nourished, in no apparent distress. Eyes: PERRLA, EOMs, conjunctiva no swelling or erythema Sinuses: Bil frontal sinus tenderness without maxillary tenderness. No erythema or overlying swelling.  ENT/Mouth: Ext aud canals clear, TMs without erythema, bulging. No erythema, swelling, or exudate on post pharynx.  Tonsils not swollen or erythematous. Hearing normal. Nostrils without discharge or ulceration. Mildly erythematous. No polyps visible.  Neck: Supple Respiratory: Respiratory effort normal, BS equal bilaterally without rales, rhonchi, wheezing or stridor.  Cardio: RRR with no MRGs. Brisk peripheral pulses without edema.  Abdomen: Soft, + BS.  Non tender, no guarding, rebound, hernias, masses. No CVA tenderness.  Lymphatics: Non tender without lymphadenopathy.  Musculoskeletal: normal gait.  Skin: Warm, dry without rashes, lesions, ecchymosis.  Neuro: Normal muscle tone Psych: Awake and oriented X 3, normal affect, Insight and Judgment appropriate.  Izora Ribas, NP 11:21 AM [facility]

## 2020-03-12 ENCOUNTER — Ambulatory Visit (INDEPENDENT_AMBULATORY_CARE_PROVIDER_SITE_OTHER): Payer: Medicare Other | Admitting: Adult Health

## 2020-03-12 ENCOUNTER — Other Ambulatory Visit: Payer: Self-pay

## 2020-03-12 ENCOUNTER — Encounter: Payer: Self-pay | Admitting: Adult Health

## 2020-03-12 VITALS — BP 132/82 | HR 90 | Temp 96.3°F | Wt 163.6 lb

## 2020-03-12 DIAGNOSIS — R350 Frequency of micturition: Secondary | ICD-10-CM

## 2020-03-12 DIAGNOSIS — R8290 Unspecified abnormal findings in urine: Secondary | ICD-10-CM

## 2020-03-12 DIAGNOSIS — J0110 Acute frontal sinusitis, unspecified: Secondary | ICD-10-CM | POA: Diagnosis not present

## 2020-03-12 MED ORDER — CEPHALEXIN 500 MG PO CAPS: 500.0000 mg | ORAL_CAPSULE | Freq: Two times a day (BID) | ORAL | 0 refills | Status: AC

## 2020-03-12 MED ORDER — PREDNISONE 20 MG PO TABS
ORAL_TABLET | ORAL | 0 refills | Status: DC
Start: 2020-03-12 — End: 2020-04-13

## 2020-03-12 NOTE — Patient Instructions (Signed)
Cephalexin Tablets or Capsules What is this medicine? CEPHALEXIN (sef a LEX in) is a cephalosporin antibiotic. It treats some infections caused by bacteria. It will not work for colds, the flu, or other viruses. This medicine may be used for other purposes; ask your health care provider or pharmacist if you have questions. COMMON BRAND NAME(S): Biocef, Daxbia, Keflex, Keftab What should I tell my health care provider before I take this medicine? They need to know if you have any of these conditions:  kidney disease  stomach or intestine problems, especially colitis  an unusual or allergic reaction to cephalexin, other cephalosporins, penicillins, other antibiotics, medicines, foods, dyes or preservatives  pregnant or trying to get pregnant  breast-feeding How should I use this medicine? Take this drug by mouth. Take it as directed on the prescription label at the same time every day. You can take it with or without food. If it upsets your stomach, take it with food. Take all of this drug unless your health care provider tells you to stop it early. Keep taking it even if you think you are better. Talk to your health care provider about the use of this drug in children. While it may be prescribed for selected conditions, precautions do apply. Overdosage: If you think you have taken too much of this medicine contact a poison control center or emergency room at once. NOTE: This medicine is only for you. Do not share this medicine with others. What if I miss a dose? If you miss a dose, take it as soon as you can. If it is almost time for your next dose, take only that dose. Do not take double or extra doses. What may interact with this medicine?  probenecid  some other antibiotics This list may not describe all possible interactions. Give your health care provider a list of all the medicines, herbs, non-prescription drugs, or dietary supplements you use. Also tell them if you smoke, drink  alcohol, or use illegal drugs. Some items may interact with your medicine. What should I watch for while using this medicine? Tell your doctor or health care provider if your symptoms do not begin to improve in a few days. This medicine may cause serious skin reactions. They can happen weeks to months after starting the medicine. Contact your health care provider right away if you notice fevers or flu-like symptoms with a rash. The rash may be red or purple and then turn into blisters or peeling of the skin. Or, you might notice a red rash with swelling of the face, lips or lymph nodes in your neck or under your arms. Do not treat diarrhea with over the counter products. Contact your doctor if you have diarrhea that lasts more than 2 days or if it is severe and watery. If you have diabetes, you may get a false-positive result for sugar in your urine. Check with your doctor or health care provider. What side effects may I notice from receiving this medicine? Side effects that you should report to your doctor or health care professional as soon as possible:  allergic reactions like skin rash, itching or hives, swelling of the face, lips, or tongue  breathing problems  pain or trouble passing urine  redness, blistering, peeling or loosening of the skin, including inside the mouth  severe or watery diarrhea  unusually weak or tired  yellowing of the eyes, skin Side effects that usually do not require medical attention (report to your doctor or health care professional  if they continue or are bothersome):  gas or heartburn  genital or anal irritation  headache  joint or muscle pain  nausea, vomiting This list may not describe all possible side effects. Call your doctor for medical advice about side effects. You may report side effects to FDA at 1-800-FDA-1088. Where should I keep my medicine? Keep out of the reach of children and pets. Store at room temperature between 20 and 25  degrees C (68 and 77 degrees F). Throw away any unused drug after the expiration date. NOTE: This sheet is a summary. It may not cover all possible information. If you have questions about this medicine, talk to your doctor, pharmacist, or health care provider.  2020 Elsevier/Gold Standard (2019-05-16 11:27:00)

## 2020-03-14 LAB — URINALYSIS, ROUTINE W REFLEX MICROSCOPIC
Bilirubin Urine: NEGATIVE
Glucose, UA: NEGATIVE
Hyaline Cast: NONE SEEN /LPF
Nitrite: POSITIVE — AB
RBC / HPF: >=60 /HPF — AB (ref 0–2)
Specific Gravity, Urine: 1.019 (ref 1.001–1.03)
Squamous Epithelial / HPF: NONE SEEN /HPF (ref <=5)
WBC, UA: >=60 /HPF — AB (ref 0–5)
pH: 5.5 (ref 5.0–8.0)

## 2020-03-14 LAB — URINE CULTURE
MICRO NUMBER:: 10507501
SPECIMEN QUALITY:: ADEQUATE

## 2020-04-02 ENCOUNTER — Ambulatory Visit (HOSPITAL_COMMUNITY)
Admission: RE | Admit: 2020-04-02 | Discharge: 2020-04-02 | Disposition: A | Discharge status: Home / Self Care | Payer: Medicare Other | Source: Ambulatory Visit | Attending: Internal Medicine | Admitting: Internal Medicine

## 2020-04-02 ENCOUNTER — Encounter (HOSPITAL_COMMUNITY): Payer: Self-pay

## 2020-04-02 ENCOUNTER — Other Ambulatory Visit: Payer: Self-pay

## 2020-04-02 ENCOUNTER — Inpatient Hospital Stay: Payer: Medicare Other | Attending: Internal Medicine

## 2020-04-02 DIAGNOSIS — C3412 Malignant neoplasm of upper lobe, left bronchus or lung: Secondary | ICD-10-CM | POA: Insufficient documentation

## 2020-04-02 DIAGNOSIS — M47816 Spondylosis without myelopathy or radiculopathy, lumbar region: Secondary | ICD-10-CM | POA: Insufficient documentation

## 2020-04-02 DIAGNOSIS — E785 Hyperlipidemia, unspecified: Secondary | ICD-10-CM | POA: Diagnosis not present

## 2020-04-02 DIAGNOSIS — I10 Essential (primary) hypertension: Secondary | ICD-10-CM | POA: Diagnosis not present

## 2020-04-02 DIAGNOSIS — K5730 Diverticulosis of large intestine without perforation or abscess without bleeding: Secondary | ICD-10-CM | POA: Diagnosis not present

## 2020-04-02 DIAGNOSIS — Z79899 Other long term (current) drug therapy: Secondary | ICD-10-CM | POA: Insufficient documentation

## 2020-04-02 DIAGNOSIS — C3490 Malignant neoplasm of unspecified part of unspecified bronchus or lung: Secondary | ICD-10-CM | POA: Diagnosis not present

## 2020-04-02 DIAGNOSIS — I700 Atherosclerosis of aorta: Secondary | ICD-10-CM | POA: Diagnosis not present

## 2020-04-02 DIAGNOSIS — K449 Diaphragmatic hernia without obstruction or gangrene: Secondary | ICD-10-CM | POA: Insufficient documentation

## 2020-04-02 DIAGNOSIS — Z90722 Acquired absence of ovaries, bilateral: Secondary | ICD-10-CM | POA: Diagnosis not present

## 2020-04-02 DIAGNOSIS — C3432 Malignant neoplasm of lower lobe, left bronchus or lung: Secondary | ICD-10-CM | POA: Diagnosis present

## 2020-04-02 DIAGNOSIS — Z880 Allergy status to penicillin: Secondary | ICD-10-CM | POA: Insufficient documentation

## 2020-04-02 DIAGNOSIS — R05 Cough: Secondary | ICD-10-CM | POA: Insufficient documentation

## 2020-04-02 DIAGNOSIS — R21 Rash and other nonspecific skin eruption: Secondary | ICD-10-CM | POA: Insufficient documentation

## 2020-04-02 DIAGNOSIS — R5383 Other fatigue: Secondary | ICD-10-CM | POA: Insufficient documentation

## 2020-04-02 LAB — CBC WITH DIFFERENTIAL (CANCER CENTER ONLY)
Abs Immature Granulocytes: 0.02 10*3/uL (ref 0.00–0.07)
Basophils Absolute: 0.1 10*3/uL (ref 0.0–0.1)
Basophils Relative: 1 %
Eosinophils Absolute: 0.4 10*3/uL (ref 0.0–0.5)
Eosinophils Relative: 6 %
HCT: 38.8 % (ref 36.0–46.0)
Hemoglobin: 12.1 g/dL (ref 12.0–15.0)
Immature Granulocytes: 0 %
Lymphocytes Relative: 20 %
Lymphs Abs: 1.2 10*3/uL (ref 0.7–4.0)
MCH: 27.1 pg (ref 26.0–34.0)
MCHC: 31.2 g/dL (ref 30.0–36.0)
MCV: 86.8 fL (ref 80.0–100.0)
Monocytes Absolute: 0.6 10*3/uL (ref 0.1–1.0)
Monocytes Relative: 10 %
Neutro Abs: 3.7 10*3/uL (ref 1.7–7.7)
Neutrophils Relative %: 63 %
Platelet Count: 179 10*3/uL (ref 150–400)
RBC: 4.47 MIL/uL (ref 3.87–5.11)
RDW: 13.5 % (ref 11.5–15.5)
WBC Count: 5.9 10*3/uL (ref 4.0–10.5)
nRBC: 0 % (ref 0.0–0.2)

## 2020-04-02 LAB — CMP (CANCER CENTER ONLY)
ALT: 20 U/L (ref 0–44)
AST: 18 U/L (ref 15–41)
Albumin: 3.3 g/dL — ABNORMAL LOW (ref 3.5–5.0)
Alkaline Phosphatase: 74 U/L (ref 38–126)
Anion gap: 9 (ref 5–15)
BUN: 15 mg/dL (ref 8–23)
CO2: 25 mmol/L (ref 22–32)
Calcium: 8.8 mg/dL — ABNORMAL LOW (ref 8.9–10.3)
Chloride: 109 mmol/L (ref 98–111)
Creatinine: 1.02 mg/dL — ABNORMAL HIGH (ref 0.44–1.00)
GFR, Est AFR Am: >60 mL/min (ref >60)
GFR, Estimated: 55 mL/min — ABNORMAL LOW (ref >60)
Glucose, Bld: 87 mg/dL (ref 70–99)
Potassium: 3.9 mmol/L (ref 3.5–5.1)
Sodium: 143 mmol/L (ref 135–145)
Total Bilirubin: 1.1 mg/dL (ref 0.3–1.2)
Total Protein: 6.7 g/dL (ref 6.5–8.1)

## 2020-04-02 MED ORDER — IOHEXOL 300 MG/ML  SOLN
100.0000 mL | Freq: Once | INTRAMUSCULAR | Status: AC | PRN
  Administered 2020-04-02: 100 mL via INTRAVENOUS

## 2020-04-02 MED ORDER — IOHEXOL 300 MG/ML  SOLN: 100.0000 mL | Freq: Once | INTRAMUSCULAR | Status: DC | PRN

## 2020-04-04 ENCOUNTER — Other Ambulatory Visit: Payer: Self-pay | Admitting: Physician Assistant

## 2020-04-04 ENCOUNTER — Other Ambulatory Visit: Payer: Self-pay | Admitting: Internal Medicine

## 2020-04-04 DIAGNOSIS — E039 Hypothyroidism, unspecified: Secondary | ICD-10-CM

## 2020-04-04 MED ORDER — MONTELUKAST SODIUM 10 MG PO TABS: ORAL_TABLET | ORAL | 0 refills | Status: DC

## 2020-04-05 ENCOUNTER — Inpatient Hospital Stay (HOSPITAL_BASED_OUTPATIENT_CLINIC_OR_DEPARTMENT_OTHER): Payer: Medicare Other | Admitting: Internal Medicine

## 2020-04-05 ENCOUNTER — Other Ambulatory Visit: Payer: Self-pay

## 2020-04-05 ENCOUNTER — Encounter: Payer: Self-pay | Admitting: Internal Medicine

## 2020-04-05 VITALS — BP 132/72 | HR 78 | Temp 97.2°F | Resp 18 | Ht 59.0 in | Wt 166.5 lb

## 2020-04-05 DIAGNOSIS — C3412 Malignant neoplasm of upper lobe, left bronchus or lung: Secondary | ICD-10-CM | POA: Diagnosis not present

## 2020-04-05 DIAGNOSIS — I10 Essential (primary) hypertension: Secondary | ICD-10-CM | POA: Diagnosis not present

## 2020-04-05 DIAGNOSIS — Z5111 Encounter for antineoplastic chemotherapy: Secondary | ICD-10-CM | POA: Diagnosis not present

## 2020-04-05 DIAGNOSIS — C3492 Malignant neoplasm of unspecified part of left bronchus or lung: Secondary | ICD-10-CM

## 2020-04-05 NOTE — Progress Notes (Signed)
[facility] [phone]   [phone]  OFFICE PROGRESS NOTE  [name], MD [address] [place] [address]  DIAGNOSIS: Stage IV (T2a, N0, M1a) non-small cell lung cancer, adenocarcinoma with positive EGFR mutation with deletion in exon 19 diagnosed in June 2016 and presented with large mass in the left lower lobe in addition to multiple bilateral pulmonary nodules  PRIOR THERAPY:  1) Gilotrif 40 mg by mouth daily started 05/11/2015, status post 9 months of treatment discontinued on 02/17/2016 secondary to extensive skin rash. 2) SBRT to the enlarging left upper lobe nodule under the care of Dr. Lisbeth Renshaw.  CURRENT THERAPY: Gilotrif 30 mg by mouth daily started 02/21/2016 status post 49 months of treatment.  INTERVAL HISTORY: Elizabeth [name] 74 y.o. female returns to the clinic today for follow-up visit.  The patient is feeling fine today with no concerning complaints.  She tolerated the radiotherapy to the left upper lobe lung nodule fairly well except for dry cough and fatigue after the procedure.  She denied having any current chest pain, shortness of breath, cough or hemoptysis.  She denied having any nausea, vomiting, diarrhea or constipation.  She denied having any headache or visual changes.  She has no recent weight loss or night sweats.  She continues to tolerate her treatment with Gilotrif fairly well.  The patient is here today for evaluation and repeat CT scan of the chest, abdomen pelvis for restaging of her disease.   MEDICAL HISTORY: Past Medical History:  Diagnosis Date  . Adenocarcinoma of left lung, stage 4 ([place]) 04/05/2015   Biopsy confirmed CT SCAN: There are innumerable bilateral pulmonary nodules. These range in size from about 5 mm to 2 cm. They demonstrate irregular indistinct borders, the larger ones demonstrating spiculation. PET SCAN: Diffuse pulmonary metastatic disease with a 4 cm dominant left lower  low lung lesion. No enlarged or hypermetabolic mediastinal or hilar adenopathy.  . Allergy   . Anxiety   . Cancer ([place])   . Change of skin related to chemotherapy 09/25/2017  . Drug-induced skin rash 01/17/2016  . Hyperlipidemia   . Hypertension   . Insomnia   . Prediabetes   . Thyroid disease     ALLERGIES:  is allergic to penicillins.  MEDICATIONS:  Current Outpatient Medications  Medication Sig Dispense Refill  . ALPRAZolam (XANAX) 1 MG tablet TAKE 1/2 TO 1 TABLET BY MOUTH 2-3 TIMES DAILY ONLY IF NEEDED FOR PANIC/ANXIETY. LIMIT TO 5 DAYS PER WEEK TO AVOID ADDICTION/DEMENTIA 90 tablet 0  . aspirin 81 MG tablet Take 81 mg by mouth at bedtime.     . Cholecalciferol (VITAMIN D3) 10000 UNITS TABS Take 10,000 Units by mouth 3 (three) times a week.     Marland Kitchen GILOTRIF 30 MG tablet TAKE 1 TABLET (30MG) BY MOUTH ONCE DAILY. TAKE ON AN EMPTY STOMACH 1 HOUR BEFORE OR 2 HOURS AFTER A MEAL 30 tablet 2  . ibuprofen (ADVIL,MOTRIN) 200 MG tablet Take 200 mg by mouth every 6 (six) hours as needed.    Marland Kitchen ipratropium (ATROVENT) 0.03 % nasal spray U 2 SPRAYS IEN TID    . levothyroxine (SYNTHROID) 50 MCG tablet Take 1 tablet daily on an empty stomach with only water for 30 minutes & no Antacid meds, Calcium or Magnesium for 4 hours & avoid Biotin 90 tablet 0  . Melatonin 10 MG CAPS Take 1 capsule by mouth at bedtime as needed.    . montelukast (SINGULAIR)  10 MG tablet Take 1 tablet Daily for Allergies 90 tablet 0  . mupirocin ointment (BACTROBAN) 2 % Place 1 application in the nose 2 x /day 22 g 3  . promethazine-codeine (PHENERGAN WITH CODEINE) 6.25-10 MG/5ML syrup Take 1 or 2 teaspoonful every 4 hours as needed for Cough / Congestion 360 mL 1  . Cyanocobalamin (VITAMIN B 12 PO) Take 1 tablet by mouth daily.    Marland Kitchen gabapentin (NEURONTIN) 300 MG capsule Take 1 capsule 3 x /day for Pain (Patient not taking: Reported on 03/12/2020) 270 capsule 1  . meloxicam (MOBIC) 15 MG tablet Take 15 mg by mouth daily. (Patient  not taking: Reported on 04/05/2020)    . predniSONE (DELTASONE) 20 MG tablet 2 tablets daily for 3 days, 1 tablet daily for 4 days. (Patient not taking: Reported on 04/05/2020) 10 tablet 0   No current facility-administered medications for this visit.    SURGICAL HISTORY:  Past Surgical History:  Procedure Laterality Date  . ABDOMINAL HYSTERECTOMY  1995   w BSO  . TONSILLECTOMY AND ADENOIDECTOMY      REVIEW OF SYSTEMS:  Constitutional: negative Eyes: negative Ears, nose, mouth, throat, and face: negative Respiratory: positive for cough Cardiovascular: negative Gastrointestinal: negative Genitourinary:negative Integument/breast: negative Hematologic/lymphatic: negative Musculoskeletal:negative Neurological: negative Behavioral/Psych: negative Endocrine: negative Allergic/Immunologic: negative   PHYSICAL EXAMINATION: General appearance: alert, cooperative and no distress Head: Normocephalic, without obvious abnormality, atraumatic Neck: no adenopathy, no JVD, supple, symmetrical, trachea midline and thyroid not enlarged, symmetric, no tenderness/mass/nodules Lymph nodes: Cervical, supraclavicular, and axillary nodes normal. Resp: clear to auscultation bilaterally Back: symmetric, no curvature. ROM normal. No CVA tenderness. Cardio: regular rate and rhythm, S1, S2 normal, no murmur, click, rub or gallop GI: soft, non-tender; bowel sounds normal; no masses,  no organomegaly Extremities: extremities normal, atraumatic, no cyanosis or edema Neurologic: Alert and oriented X 3, normal strength and tone. Normal symmetric reflexes. Normal coordination and gait  ECOG PERFORMANCE STATUS: 1 - Symptomatic but completely ambulatory  Blood pressure 132/72, pulse 78, temperature (!) 97.2 F (36.2 C), temperature source Temporal, resp. rate 18, height '4\' 11"'  (1.499 m), weight 166 lb 8 oz (75.5 kg), SpO2 98 %.  LABORATORY DATA: Lab Results  Component Value Date   WBC 5.9 04/02/2020   HGB  12.1 04/02/2020   HCT 38.8 04/02/2020   MCV 86.8 04/02/2020   PLT 179 04/02/2020      Chemistry      Component Value Date/Time   NA 143 04/02/2020 0838   NA 139 10/26/2017 1252   K 3.9 04/02/2020 0838   K 4.1 10/26/2017 1252   CL 109 04/02/2020 0838   CO2 25 04/02/2020 0838   CO2 28 10/26/2017 1252   BUN 15 04/02/2020 0838   BUN 15.7 10/26/2017 1252   CREATININE 1.02 (H) 04/02/2020 0838   CREATININE 1.01 (H) 10/08/2019 1133   CREATININE 1.0 10/26/2017 1252      Component Value Date/Time   CALCIUM 8.8 (L) 04/02/2020 0838   CALCIUM 8.8 10/26/2017 1252   ALKPHOS 74 04/02/2020 0838   ALKPHOS 70 10/26/2017 1252   AST 18 04/02/2020 0838   AST 19 10/26/2017 1252   ALT 20 04/02/2020 0838   ALT 22 10/26/2017 1252   BILITOT 1.1 04/02/2020 0838   BILITOT 0.90 10/26/2017 1252       RADIOGRAPHIC STUDIES: CT Chest W Contrast  Result Date: 04/02/2020 CLINICAL DATA:  Primary Cancer Type: Lung Imaging Indication: Restaging for enlarging left upper lobe pulmonary nodule. Interval therapy  since last imaging? Yes Initial Cancer Diagnosis Date: 04/01/2015; Established by: Biopsy-proven Detailed Pathology: Stage IV non-small cell lung cancer, adenocarcinoma Primary Tumor location: Left lower lobe Chemotherapy?: No Immunotherapy? No Radiation therapy? Yes; Date Range: 12/11/2019-12/18/2019; Target: Left lung Other Cancer Therapies: Gilotrif, daily since 2017. EXAM: CT CHEST, ABDOMEN, AND PELVIS WITH CONTRAST TECHNIQUE: Multidetector CT imaging of the chest, abdomen and pelvis was performed following the standard protocol during bolus administration of intravenous contrast. CONTRAST:  144m OMNIPAQUE IOHEXOL 300 MG/ML  SOLN COMPARISON:  Most recent CT chest, abdomen and pelvis 11/18/2019. 03/25/2015 PET-CT. FINDINGS: CT CHEST FINDINGS Cardiovascular: Normal heart size. No significant pericardial effusion/thickening. Atherosclerotic nonaneurysmal thoracic aorta. Normal caliber pulmonary arteries. No  central pulmonary emboli. Mediastinum/Nodes: Stable subcentimeter hypodense left thyroid nodule. Not clinically significant; no follow-up imaging recommended (ref: J Am Coll Radiol. 2015 Feb;12(2): 143-50). Unremarkable esophagus. No pathologically enlarged axillary, mediastinal or hilar lymph nodes. Lungs/Pleura: No pneumothorax. No pleural effusion. New sharply marginated bandlike left perihilar consolidation encompassing previously described enlarging posterior left upper lobe 1.2 cm pulmonary nodule, which is no longer discretely visualized on today's scan. Previous left infrahilar radiation fibrosis. Numerous sub solid and ground-glass pulmonary nodules scattered throughout both lungs are all unchanged. For example peripheral left upper lobe 1.2 x 0.8 cm nodule (series 6/image 51), previously 1.2 x 0.8 cm. Representative 1.6 cm superior segment right lower lobe ground-glass nodule (series 6/image 54), previously 1.6 cm. No new significant pulmonary nodules. Musculoskeletal: No aggressive appearing focal osseous lesions. Mild thoracic spondylosis. CT ABDOMEN PELVIS FINDINGS Hepatobiliary: Stable 4.1 cm posterior right liver dome hemangioma. Normal liver size. No new liver lesions. Normal gallbladder with no radiopaque cholelithiasis. No biliary ductal dilatation. Pancreas: Normal, with no mass or duct dilation. Spleen: Normal size. No mass. Adrenals/Urinary Tract: Normal adrenals. Normal kidneys with no hydronephrosis and no renal mass. Normal bladder. Stomach/Bowel: Small hiatal hernia. Otherwise normal nondistended stomach. Normal caliber small bowel with no small bowel wall thickening. Normal appendix. Oral contrast transits to the colon. Mild sigmoid diverticulosis, with no large bowel wall thickening or significant pericolonic fat stranding. Vascular/Lymphatic: Atherosclerotic nonaneurysmal abdominal aorta. Patent portal, splenic, hepatic and renal veins. No pathologically enlarged lymph nodes in the  abdomen or pelvis. Reproductive: Status post hysterectomy, with no abnormal findings at the vaginal cuff. No adnexal mass. Other: No pneumoperitoneum, ascites or focal fluid collection. Musculoskeletal: No aggressive appearing focal osseous lesions. Moderate lower lumbar spondylosis. IMPRESSION: 1. New left perihilar postradiation change encompasses the treated posterior left upper lobe pulmonary nodule, which is no longer discretely visualized. 2. No new or progressive metastatic disease in the chest. Numerous additional subsolid and ground-glass pulmonary nodules in both lungs are stable. 3. No evidence of metastatic disease in the abdomen or pelvis. 4. Aortic Atherosclerosis (ICD10-I70.0). Additional chronic findings as detailed. Electronically Signed   By: JIlona SorrelM.D.   On: 04/02/2020 11:52   CT Abdomen Pelvis W Contrast  Result Date: 04/02/2020 CLINICAL DATA:  Primary Cancer Type: Lung Imaging Indication: Restaging for enlarging left upper lobe pulmonary nodule. Interval therapy since last imaging? Yes Initial Cancer Diagnosis Date: 04/01/2015; Established by: Biopsy-proven Detailed Pathology: Stage IV non-small cell lung cancer, adenocarcinoma Primary Tumor location: Left lower lobe Chemotherapy?: No Immunotherapy? No Radiation therapy? Yes; Date Range: 12/11/2019-12/18/2019; Target: Left lung Other Cancer Therapies: Gilotrif, daily since 2017. EXAM: CT CHEST, ABDOMEN, AND PELVIS WITH CONTRAST TECHNIQUE: Multidetector CT imaging of the chest, abdomen and pelvis was performed following the standard protocol during bolus administration of intravenous contrast. CONTRAST:  151m OMNIPAQUE IOHEXOL 300 MG/ML  SOLN COMPARISON:  Most recent CT chest, abdomen and pelvis 11/18/2019. 03/25/2015 PET-CT. FINDINGS: CT CHEST FINDINGS Cardiovascular: Normal heart size. No significant pericardial effusion/thickening. Atherosclerotic nonaneurysmal thoracic aorta. Normal caliber pulmonary arteries. No central pulmonary  emboli. Mediastinum/Nodes: Stable subcentimeter hypodense left thyroid nodule. Not clinically significant; no follow-up imaging recommended (ref: J Am Coll Radiol. 2015 Feb;12(2): 143-50). Unremarkable esophagus. No pathologically enlarged axillary, mediastinal or hilar lymph nodes. Lungs/Pleura: No pneumothorax. No pleural effusion. New sharply marginated bandlike left perihilar consolidation encompassing previously described enlarging posterior left upper lobe 1.2 cm pulmonary nodule, which is no longer discretely visualized on today's scan. Previous left infrahilar radiation fibrosis. Numerous sub solid and ground-glass pulmonary nodules scattered throughout both lungs are all unchanged. For example peripheral left upper lobe 1.2 x 0.8 cm nodule (series 6/image 51), previously 1.2 x 0.8 cm. Representative 1.6 cm superior segment right lower lobe ground-glass nodule (series 6/image 54), previously 1.6 cm. No new significant pulmonary nodules. Musculoskeletal: No aggressive appearing focal osseous lesions. Mild thoracic spondylosis. CT ABDOMEN PELVIS FINDINGS Hepatobiliary: Stable 4.1 cm posterior right liver dome hemangioma. Normal liver size. No new liver lesions. Normal gallbladder with no radiopaque cholelithiasis. No biliary ductal dilatation. Pancreas: Normal, with no mass or duct dilation. Spleen: Normal size. No mass. Adrenals/Urinary Tract: Normal adrenals. Normal kidneys with no hydronephrosis and no renal mass. Normal bladder. Stomach/Bowel: Small hiatal hernia. Otherwise normal nondistended stomach. Normal caliber small bowel with no small bowel wall thickening. Normal appendix. Oral contrast transits to the colon. Mild sigmoid diverticulosis, with no large bowel wall thickening or significant pericolonic fat stranding. Vascular/Lymphatic: Atherosclerotic nonaneurysmal abdominal aorta. Patent portal, splenic, hepatic and renal veins. No pathologically enlarged lymph nodes in the abdomen or pelvis.  Reproductive: Status post hysterectomy, with no abnormal findings at the vaginal cuff. No adnexal mass. Other: No pneumoperitoneum, ascites or focal fluid collection. Musculoskeletal: No aggressive appearing focal osseous lesions. Moderate lower lumbar spondylosis. IMPRESSION: 1. New left perihilar postradiation change encompasses the treated posterior left upper lobe pulmonary nodule, which is no longer discretely visualized. 2. No new or progressive metastatic disease in the chest. Numerous additional subsolid and ground-glass pulmonary nodules in both lungs are stable. 3. No evidence of metastatic disease in the abdomen or pelvis. 4. Aortic Atherosclerosis (ICD10-I70.0). Additional chronic findings as detailed. Electronically Signed   By: JIlona SorrelM.D.   On: 04/02/2020 11:52    ASSESSMENT AND PLAN:  This is a very pleasant 74years old white female with stage IV non-small cell lung cancer, adenocarcinoma with positive EGFR mutation with deletion in exon 19 and currently undergoing treatment with Gilotrif initially at a dose of 40 mg for 9 months followed by 30 mg by mouth daily status post 49 months.  She also underwent SBRT to the enlarging left upper lobe lung nodule. She has been tolerating this treatment well with no concerning adverse effects. The patient had repeat CT scan of the chest, abdomen pelvis performed recently.  I personally and independently reviewed the scan images and discussed the results with the patient today. Her scan showed no concerning findings for disease progression but there was development of new radiation changes in the left upper lobe and the previous pulmonary nodule is no longer visible. I recommended for the patient to continue her current treatment with Gleevec with the same dose. For the skin rash she will continue with the clindamycin lotion. For the hypertension, her blood pressure is well controlled today and she will  continue to monitor it closely at  home. The patient will come back for follow-up visit in 2 months for evaluation and repeat blood work. She was advised to call immediately if she has any other concerning symptoms in the interval. The patient voices understanding of current disease status and treatment options and is in agreement with the current care plan. All questions were answered. The patient knows to call the clinic with any problems, questions or concerns. We can certainly see the patient much sooner if necessary.  Disclaimer: This note was dictated with voice recognition software. Similar sounding words can inadvertently be transcribed and may not be corrected upon review.

## 2020-04-07 ENCOUNTER — Telehealth: Payer: Self-pay | Admitting: Internal Medicine

## 2020-04-07 NOTE — Telephone Encounter (Signed)
Scheduled per los. Called and left msg. Mailed printout  °

## 2020-04-13 ENCOUNTER — Encounter: Payer: Self-pay | Admitting: Adult Health Nurse Practitioner

## 2020-04-13 ENCOUNTER — Other Ambulatory Visit: Payer: Self-pay

## 2020-04-13 ENCOUNTER — Ambulatory Visit (INDEPENDENT_AMBULATORY_CARE_PROVIDER_SITE_OTHER): Payer: Medicare Other | Admitting: Adult Health Nurse Practitioner

## 2020-04-13 ENCOUNTER — Ambulatory Visit: Payer: Medicare Other | Admitting: Physician Assistant

## 2020-04-13 VITALS — BP 126/86 | HR 100 | Temp 97.5°F | Ht 59.0 in | Wt 167.0 lb

## 2020-04-13 DIAGNOSIS — R062 Wheezing: Secondary | ICD-10-CM

## 2020-04-13 DIAGNOSIS — I10 Essential (primary) hypertension: Secondary | ICD-10-CM

## 2020-04-13 DIAGNOSIS — E039 Hypothyroidism, unspecified: Secondary | ICD-10-CM

## 2020-04-13 DIAGNOSIS — R300 Dysuria: Secondary | ICD-10-CM

## 2020-04-13 DIAGNOSIS — Z0000 Encounter for general adult medical examination without abnormal findings: Secondary | ICD-10-CM

## 2020-04-13 DIAGNOSIS — R6889 Other general symptoms and signs: Secondary | ICD-10-CM

## 2020-04-13 DIAGNOSIS — G4700 Insomnia, unspecified: Secondary | ICD-10-CM

## 2020-04-13 DIAGNOSIS — F419 Anxiety disorder, unspecified: Secondary | ICD-10-CM

## 2020-04-13 DIAGNOSIS — Z0001 Encounter for general adult medical examination with abnormal findings: Secondary | ICD-10-CM

## 2020-04-13 DIAGNOSIS — R7309 Other abnormal glucose: Secondary | ICD-10-CM

## 2020-04-13 DIAGNOSIS — E782 Mixed hyperlipidemia: Secondary | ICD-10-CM | POA: Diagnosis not present

## 2020-04-13 DIAGNOSIS — Z79899 Other long term (current) drug therapy: Secondary | ICD-10-CM

## 2020-04-13 DIAGNOSIS — E559 Vitamin D deficiency, unspecified: Secondary | ICD-10-CM

## 2020-04-13 DIAGNOSIS — C3492 Malignant neoplasm of unspecified part of left bronchus or lung: Secondary | ICD-10-CM

## 2020-04-13 DIAGNOSIS — J0140 Acute pansinusitis, unspecified: Secondary | ICD-10-CM | POA: Diagnosis not present

## 2020-04-13 DIAGNOSIS — E2839 Other primary ovarian failure: Secondary | ICD-10-CM

## 2020-04-13 DIAGNOSIS — Z6833 Body mass index (BMI) 33.0-33.9, adult: Secondary | ICD-10-CM

## 2020-04-13 MED ORDER — DOXYCYCLINE HYCLATE 100 MG PO TABS: ORAL_TABLET | ORAL | 0 refills | Status: DC

## 2020-04-13 MED ORDER — DEXAMETHASONE 0.5 MG PO TABS: ORAL_TABLET | ORAL | 0 refills | Status: DC

## 2020-04-13 NOTE — Progress Notes (Signed)
MEDICARE ANNUAL WELLNESS AND FOLLOW UP  Assessment and Plan:  Elizabeth [name] was seen today for medicare wellness, follow-up and urinary tract infection.  Diagnoses and all orders for this visit:  Encounter for Medicare annual wellness exam Yearly  Dysuria Increase water intake -     Urinalysis w microscopic + reflex cultur  Essential hypertension Continue current medications: Monitor blood pressure at home; call if consistently over 130/80 Continue DASH diet.   Reminder to go to the ER if any CP, SOB, nausea, dizziness, severe HA, changes vision/speech, left arm numbness and tingling and jaw pain.  -     CBC with Differential/Platelet -     COMPLETE METABOLIC PANEL WITH GFR  Hyperlipidemia, mixed Continue medications: Discussed dietary and exercise modifications Low fat diet -     Lipid panel  Acquired hypothyroidism Taking levothyroxine * mcg daily Reminder to take on an empty stomach 30-48mins before first meal of the day. No antacid medications for 4 hours. -     TSH  Acute non-recurrent pansinusitis Wheezing -     dexamethasone (DECADRON) 0.5 MG tablet; Take 1 tab 3 x day - 3 days, then 2 x day - 3 days, then 1 tab daily OTC Mucinex DM -Doxycycline 100mg  PO BID x7 days related to PCN allergy, may provide urinary coverage will check UA and culture  Adenocarcinoma of left lung, stage 4 (HCC) Continue follow up with Oncology  Vitamin D deficiency Continue supplementation to maintain goal of 60-100 Taking Vitamin D 10,000 IU three times a week.  Abnormal glucose Discussed dietary and exercise modifications  Anxiety Doing well on current regiment Continue  Discussed stress management techniques   Discussed good sleep hygiene Discussed increasing physical activity and exercise Increase water intake  Insomnia, unspecified type Discussed good sleep hygiene, decrease stimulation prior to sleep Increase day time activity Avoid caffeine in evenings Uses alprazolam  PRN,   Estrogen deficiency DEXA UTD  BMI 33.0-33.9,adult Discussed dietary and exercise modifications  Medication management Continued   Over 46min of face to face interview, exam, chart review, counseling preformed this office visit with moderate decision making.  Discussed med's effects and SE's. Screening labs and tests as requested with regular follow-up as recommended. Future Appointments  Date Time Provider [place]  06/04/2020  9:30 AM [facility] ONLY [facility] None  06/07/2020  9:45 AM Curt Bears, MD [facility] None  11/08/2020 11:00 AM [name], MD [facility] None    HPI  Elizabeth [name]  presents for annual medicare wellness and 6 month follow up  Elizabeth [name] blood pressure has been controlled at home, today their BP is BP: 126/86.  Elizabeth [name] does not workout. Elizabeth [name] denies chest pain, shortness of breath, dizziness.   Reports that Elizabeth [name] is having dysuria that started one week ago.  Elizabeth [name] reports the urine has been dark in color, denies any odor. Elizabeth [name] is also having sinus symptoms that started 10 days ago.  Runny nose and cough are Elizabeth [name] main symptoms.  Elizabeth [name] is having ear popping along with sinusitis. Elizabeth [name] reports that Elizabeth [name] cough is worst in late afternoon.  The cough is productive intermittently.  The cough does not wake Elizabeth [name] up at night.  Elizabeth [name] does wake up from whistling or wheezing at night.  Mucinex DM BID, Elizabeth [name] is taking cetirizine during the day and the Singulair every night.   Denies any water eyes, or sore throat.  Elizabeth [name] has been trying to walk the dog to move around more.  BMI is Body mass index  is 33.Elizabeth kg/m., Elizabeth [name] is working on diet and exercise. Wt Readings from Last 3 Encounters:  04/13/20 167 lb (75.8 kg)  04/05/20 166 lb 8 oz (75.5 kg)  03/12/20 163 lb 9.6 oz (74.2 kg)   Elizabeth [name] has history of stage 4 lung cancer, on Glotinef maintenance therapy 30 mg in Feb/2021 and following with Dr. Julien Nordmann. Recent CT scan showed resolution of tumor.  Last OV was three days  ago.  Got gel shot on left knee yesterday, has 2 more to go. This is the 3rd time, has bone on bone per patient. Dr. Latanya Maudlin.  Elizabeth [name] is on cholesterol medication and denies myalgias. Elizabeth [name] cholesterol is at goal. The cholesterol last visit was:  Lab Results  Component Value Date   CHOL 187 04/13/2020   HDL 48 (L) 04/13/2020   LDLCALC 120 (H) 04/13/2020   TRIG 93 04/13/2020   CHOLHDL 3.9 04/13/2020  .  Last A1C in the office was:  Lab Results  Component Value Date   HGBA1C 5.5 10/08/2019   Patient is on Vitamin D supplement.   Lab Results  Component Value Date   VD25OH 55 10/08/2019     Elizabeth [name] is on thyroid medication. Elizabeth [name] medication was not changed last visit.Elizabeth [name] is not on biotin.   Lab Results  Component Value Date   TSH 2.53 04/13/2020  .   Current Medications:  Current Outpatient Medications on File Prior to Visit  Medication Sig Dispense Refill  . aspirin 81 MG tablet Take 81 mg by mouth at bedtime.     . Cholecalciferol (VITAMIN D3) 10000 UNITS TABS Take 10,000 Units by mouth 3 (three) times a week.     . Cyanocobalamin (VITAMIN B 12 PO) Take 1 tablet by mouth daily.    Marland Kitchen gabapentin (NEURONTIN) 300 MG capsule Take 1 capsule 3 x /day for Pain 270 capsule 1  . GILOTRIF 30 MG tablet TAKE 1 TABLET (30MG ) BY MOUTH ONCE DAILY. TAKE ON AN EMPTY STOMACH 1 HOUR BEFORE OR 2 HOURS AFTER A MEAL 30 tablet 2  . ibuprofen (ADVIL,MOTRIN) 200 MG tablet Take 200 mg by mouth every 6 (six) hours as needed.    Marland Kitchen ipratropium (ATROVENT) 0.03 % nasal spray U 2 SPRAYS IEN TID    . levothyroxine (SYNTHROID) 50 MCG tablet Take 1 tablet daily on an empty stomach with only water for 30 minutes & no Antacid meds, Calcium or Magnesium for 4 hours & avoid Biotin 90 tablet 0  . Melatonin 10 MG CAPS Take 1 capsule by mouth at bedtime as needed.    . meloxicam (MOBIC) 15 MG tablet Take 15 mg by mouth daily.     . montelukast (SINGULAIR) 10 MG tablet Take 1 tablet Daily for Allergies 90 tablet 0  . mupirocin  ointment (BACTROBAN) 2 % Place 1 application in the nose 2 x /day 22 g 3  . promethazine-codeine (PHENERGAN WITH CODEINE) 6.25-10 MG/5ML syrup Take 1 or 2 teaspoonful every 4 hours as needed for Cough / Congestion 360 mL 1   No current facility-administered medications on file prior to visit.    Health Maintenance:   Immunization History  Administered Date(s) Administered  . Influenza Split 07/16/2018  . Influenza, High Dose Seasonal PF 07/31/2014, 08/06/2017, 07/09/2019  . Influenza-Unspecified 09/13/2015, 07/23/2016, 08/06/2017, 07/23/2018  . Moderna SARS-COVID-2 Vaccination 12/05/2019, 01/03/2020  . Pneumococcal Conjugate-13 12/24/2015  . Pneumococcal Polysaccharide-23 07/22/2012  . Td 12/25/2005  . Zoster 01/09/2007   Tetanus: 2007 DUE declines Pneumovax: 2013 Prevnar: 2017 Flu  UMPNTIR:[id number] [id number]:1950 discussed new shot- has not had yet  Pap: Hysterectomy MGM:10/31/2018 will get in [place]: 10/2018 will get with MGM Colonoscopy: 2014 PET 2016 MRI brain 2016 CT AB/chest 02/2019  Patient Care Team: [name], MD as PCP - General (Internal Medicine) Sharol Roussel, [place] as Physician Assistant (Optometry) Jettie Booze, MD as Consulting Physician (Interventional Cardiology) Teena Irani, MD (Inactive) as Consulting Physician (Gastroenterology) Druscilla Brownie, MD as Consulting Physician (Dermatology) Clent Jacks, MD as Consulting Physician (Ophthalmology) Erroll Luna, MD as Consulting Physician (General Surgery) Melrose Nakayama, MD as Consulting Physician (Orthopedic Surgery)  Medical History:  Past Medical History:  Diagnosis Date  . Adenocarcinoma of left lung, stage 4 ([place]) 04/05/2015   Biopsy confirmed CT SCAN: There are innumerable bilateral pulmonary nodules. These range in size from about 5 mm to 2 cm. They demonstrate irregular indistinct borders, the larger ones demonstrating spiculation. PET SCAN: Diffuse pulmonary metastatic disease with a  4 cm dominant left lower low lung lesion. No enlarged or hypermetabolic mediastinal or hilar adenopathy.  . Allergy   . Anxiety   . Cancer ([place])   . Change of skin related to chemotherapy 09/25/2017  . Drug-induced skin rash 01/17/2016  . Hyperlipidemia   . Hypertension   . Insomnia   . Prediabetes   . Thyroid disease    Allergies Allergies  Allergen Reactions  . Penicillins Swelling and Rash   SURGICAL HISTORY Elizabeth [name]  has a past surgical history that includes Tonsillectomy and adenoidectomy and Abdominal hysterectomy (1995). FAMILY HISTORY Elizabeth [name] family history includes ALS in Elizabeth [name] father; Alcohol abuse in Elizabeth [name] mother and paternal grandfather; Bone cancer in Elizabeth [name] maternal uncle; Breast cancer in Elizabeth [name] cousin; Dementia (age of onset: 63) in Elizabeth [name] paternal aunt; Dementia (age of onset: 3) in Elizabeth [name] maternal grandmother; Heart disease in Elizabeth [name] paternal grandfather; Hypertension in Elizabeth [name] father; Liver disease in Elizabeth [name] mother; Lung cancer (age of onset: 25) in Elizabeth [name] paternal grandmother; Lung disease in Elizabeth [name] maternal grandfather; Melanoma in Elizabeth [name] paternal aunt. SOCIAL HISTORY Elizabeth [name]  reports that Elizabeth [name] quit smoking about 45 years ago. Elizabeth [name] has a 5.00 pack-year smoking history. Elizabeth [name] has never used smokeless tobacco. Elizabeth [name] reports that Elizabeth [name] does not drink alcohol and does not use drugs.   MEDICARE WELLNESS OBJECTIVES: Physical activity: Current Exercise Habits: Home exercise routine, Type of exercise: walking, Time (Minutes): 20, Frequency (Times/Week): 5, Weekly Exercise (Minutes/Week): 100, Intensity: Mild, Exercise limited by: respiratory conditions(s) Cardiac risk factors: Cardiac Risk Factors include: advanced age (>28men, >60 women);dyslipidemia;hypertension;sedentary lifestyle;obesity (BMI >30kg/m2) Depression/mood screen:   Depression screen [facility] 2/9 04/13/2020  Decreased Interest 0  Down, Depressed, Hopeless 0  PHQ - 2 Score 0  Some recent data might be hidden    ADLs:  In your present state of health, do you have  any difficulty performing the following activities: 04/13/2020 10/07/2019  Hearing? N N  Vision? N N  Difficulty concentrating or making decisions? N N  Walking or climbing stairs? N N  Dressing or bathing? N N  Doing errands, shopping? N N  Preparing Food and eating ? N -  Using the Toilet? N -  In the past six months, have you accidently leaked urine? N -  Do you have problems with loss of bowel control? N -  Managing your Medications? N -  Managing your Finances? N -  Housekeeping or managing your Housekeeping? N -  Some recent data might be hidden     Cognitive Testing  Alert? Yes  Normal Appearance?Yes  Oriented to person? Yes  Place? Yes   Time? Yes  Recall of three objects?  Yes  Can perform simple calculations? Yes  Displays appropriate judgment?Yes  Can read the correct time from a watch face?Yes  EOL planning: Does Patient Have a Medical Advance Directive?: Yes Type of Advance Directive: Healthcare Power of Attorney, Living will Does patient want to make changes to medical advance directive?: No - Patient declined Copy of [place] in Chart?: No - copy requested   Review of Systems: Review of Systems  Constitutional: Negative for chills, fever and malaise/fatigue.  HENT: Negative for congestion, ear pain and sore throat.   Eyes: Negative.   Respiratory: Positive for shortness of breath. Negative for cough and wheezing.   Cardiovascular: Negative for chest pain, palpitations and leg swelling.  Gastrointestinal: Negative for abdominal pain, blood in stool, constipation, diarrhea, heartburn and melena.  Genitourinary: Negative.   Skin: Negative for itching and rash.  Neurological: Negative for dizziness, sensory change, loss of consciousness and headaches.  Psychiatric/Behavioral: Negative for depression. The patient is not nervous/anxious and does not have insomnia.     Physical Exam: Estimated body mass index is 33.Elizabeth kg/m as calculated from  the following:   Height as of this encounter: 4\' 11"  (1.499 m).   Weight as of this encounter: 167 lb (75.8 kg). BP 126/86   Pulse 100   Temp (!) 97.5 F (36.4 C)   Ht 4\' 11"  (1.499 m)   Wt 167 lb (75.8 kg)   SpO2 97%   BMI 33.Elizabeth kg/m   General Appearance: Well nourished well developed, in no apparent distress.  Eyes: PERRLA, EOMs, conjunctiva no swelling or erythema ENT/Mouth: Ear canals normal without obstruction, swelling, erythema, or discharge.  TMs normal bilaterally with no erythema, bulging, retraction, or loss of landmark.  Oropharynx moist and clear with no exudate, erythema, or swelling.  Frontal and maxillary tenderness noted. Neck: Supple, thyroid normal. No bruits.  No cervical adenopathy Respiratory: Respiratory effort normal, Breath sounds without rales.  Bilateral wheezing and rhonchi noted. Cardio: RRR without murmurs, rubs or gallops. Brisk peripheral pulses without edema.  retractions.  Abdomen: Soft, nontender, no guarding, rebound, hernias, masses, or organomegaly.  Lymphatics: Non tender without lymphadenopathy.  Musculoskeletal: Full ROM all peripheral extremities,5/5 strength, and normal gait.  Skin:  Non-tender, warm and dry Neuro: Awake and oriented X 3, Cranial nerves intact, reflexes equal bilaterally. Normal muscle tone, no cerebellar symptoms. Sensation intact.  Psych:  normal affect, Insight and Judgment appropriate.    Medicare Attestation I have personally reviewed: The patient's medical and social history Their use of alcohol, tobacco or illicit drugs Their current medications and supplements The patient's functional ability including ADLs,fall risks, home safety risks, cognitive, and hearing and visual impairment Diet and physical activities Evidence for depression or mood disorders  The patient's weight, height, BMI, and visual acuity have been recorded in the chart.  I have made referrals, counseling, and provided education to the patient  based on review of the above and I have provided the patient with a written personalized care plan for preventive services.    [name], NP 12:58 PM [facility]

## 2020-04-13 NOTE — Patient Instructions (Addendum)
° °  Continue Mucinex DM every 12 hours.   Will send in Dexamethasone 4mg  tablet taper for you to take.  Start this in the morning.  Be sure to take this with food.   Will send in Doxycycline 100mg  PO BID x7 days.    Zyrtec (cetirazine) , Xyzal (levocetirazine), if these are not effective switch to Allegra (fexofenadine)   Increase your water intake.  We will respond with your lab results via MyChart in 1-3 days.

## 2020-04-14 ENCOUNTER — Other Ambulatory Visit: Payer: Self-pay | Admitting: Adult Health Nurse Practitioner

## 2020-04-15 LAB — COMPLETE METABOLIC PANEL WITH GFR
AG Ratio: 1.5 (calc) (ref 1.0–2.5)
ALT: 28 U/L (ref 6–29)
AST: 24 U/L (ref 10–35)
Albumin: 4.1 g/dL (ref 3.6–5.1)
Alkaline phosphatase (APISO): 100 U/L (ref 37–153)
BUN/Creatinine Ratio: 16 (calc) (ref 6–22)
BUN: 18 mg/dL (ref 7–25)
CO2: 26 mmol/L (ref 20–32)
Calcium: 9.1 mg/dL (ref 8.6–10.4)
Chloride: 104 mmol/L (ref 98–110)
Creat: 1.16 mg/dL — ABNORMAL HIGH (ref 0.60–0.93)
GFR, Est African American: 54 mL/min/{1.73_m2} — ABNORMAL LOW (ref >=60)
GFR, Est Non African American: 47 mL/min/{1.73_m2} — ABNORMAL LOW (ref >=60)
Globulin: 2.7 g/dL (calc) (ref 1.9–3.7)
Glucose, Bld: 94 mg/dL (ref 65–99)
Potassium: 4.1 mmol/L (ref 3.5–5.3)
Sodium: 140 mmol/L (ref 135–146)
Total Bilirubin: 0.9 mg/dL (ref 0.2–1.2)
Total Protein: 6.8 g/dL (ref 6.1–8.1)

## 2020-04-15 LAB — URINALYSIS W MICROSCOPIC + REFLEX CULTURE
Bilirubin Urine: NEGATIVE
Glucose, UA: NEGATIVE
Hyaline Cast: NONE SEEN /LPF
Ketones, ur: NEGATIVE
Nitrites, Initial: POSITIVE — AB
Specific Gravity, Urine: 1.017 (ref 1.001–1.03)
Squamous Epithelial / HPF: NONE SEEN /HPF (ref <=5)
pH: 6 (ref 5.0–8.0)

## 2020-04-15 LAB — CBC WITH DIFFERENTIAL/PLATELET
Absolute Monocytes: 802 cells/uL (ref 200–950)
Basophils Absolute: 59 cells/uL (ref 0–200)
Basophils Relative: 0.6 %
Eosinophils Absolute: 356 cells/uL (ref 15–500)
Eosinophils Relative: 3.6 %
HCT: 39.7 % (ref 35.0–45.0)
Hemoglobin: 13 g/dL (ref 11.7–15.5)
Lymphs Abs: 1782 cells/uL (ref 850–3900)
MCH: 27.8 pg (ref 27.0–33.0)
MCHC: 32.7 g/dL (ref 32.0–36.0)
MCV: 84.8 fL (ref 80.0–100.0)
MPV: 11.5 fL (ref 7.5–12.5)
Monocytes Relative: 8.1 %
Neutro Abs: 6900 cells/uL (ref 1500–7800)
Neutrophils Relative %: 69.7 %
Platelets: 243 10*3/uL (ref 140–400)
RBC: 4.68 10*6/uL (ref 3.80–5.10)
RDW: 13.4 % (ref 11.0–15.0)
Total Lymphocyte: 18 %
WBC: 9.9 10*3/uL (ref 3.8–10.8)

## 2020-04-15 LAB — LIPID PANEL
Cholesterol: 187 mg/dL (ref <200)
HDL: 48 mg/dL — ABNORMAL LOW (ref >=50)
LDL Cholesterol (Calc): 120 mg/dL (calc) — ABNORMAL HIGH
Non-HDL Cholesterol (Calc): 139 mg/dL (calc) — ABNORMAL HIGH (ref <130)
Total CHOL/HDL Ratio: 3.9 (calc) (ref <5.0)
Triglycerides: 93 mg/dL (ref <150)

## 2020-04-15 LAB — HM MAMMOGRAPHY

## 2020-04-15 LAB — URINE CULTURE
MICRO NUMBER:: 10621395
SPECIMEN QUALITY:: ADEQUATE

## 2020-04-15 LAB — TSH: TSH: 2.53 mIU/L (ref 0.40–4.50)

## 2020-04-15 LAB — CULTURE INDICATED

## 2020-04-16 ENCOUNTER — Other Ambulatory Visit: Payer: Self-pay | Admitting: Physician Assistant

## 2020-04-20 ENCOUNTER — Encounter: Payer: Self-pay | Admitting: Internal Medicine

## 2020-04-21 ENCOUNTER — Other Ambulatory Visit: Payer: Self-pay | Admitting: Internal Medicine

## 2020-04-29 MED FILL — GILOTRIF 30 MG TABLET: 30 | 30 days supply | Qty: 30 | Fill #0

## 2020-05-06 ENCOUNTER — Ambulatory Visit (INDEPENDENT_AMBULATORY_CARE_PROVIDER_SITE_OTHER): Payer: Medicare Other | Admitting: Adult Health Nurse Practitioner

## 2020-05-06 ENCOUNTER — Other Ambulatory Visit: Payer: Self-pay

## 2020-05-06 ENCOUNTER — Encounter: Payer: Self-pay | Admitting: Adult Health Nurse Practitioner

## 2020-05-06 VITALS — BP 124/82 | HR 63 | Temp 98.3°F | Wt 166.6 lb

## 2020-05-06 DIAGNOSIS — N3000 Acute cystitis without hematuria: Secondary | ICD-10-CM | POA: Diagnosis not present

## 2020-05-06 DIAGNOSIS — C3492 Malignant neoplasm of unspecified part of left bronchus or lung: Secondary | ICD-10-CM

## 2020-05-06 DIAGNOSIS — R300 Dysuria: Secondary | ICD-10-CM

## 2020-05-06 DIAGNOSIS — R062 Wheezing: Secondary | ICD-10-CM

## 2020-05-06 DIAGNOSIS — I10 Essential (primary) hypertension: Secondary | ICD-10-CM | POA: Diagnosis not present

## 2020-05-06 NOTE — Progress Notes (Signed)
FOLLOW UP, 2 WEEK  Assessment and Plan:  Elizabeth [name] was seen today for follow-up and urinary tract infection.  Diagnoses and all orders for this visit:  Acute cystitis w/o hematura Dysuria Has completed antibiotics Increase water intake -     Urinalysis w microscopic + reflex cultur  Essential hypertension No medications at this time Monitor blood pressure at home; call if consistently over 130/80 Continue DASH diet.   Reminder to go to the ER if any CP, SOB, nausea, dizziness, severe HA, changes vision/speech, left arm numbness and tingling and jaw pain. -     CBC with Differential/Platelet -     COMPLETE METABOLIC PANEL WITH GFR  Wheezing Completed   dexamethasone taper OTC Mucinex DM Tried duoneb last OV w/o improvement. Has tried Claritin that helped some Take Zyrtec (Cetirazine) nightly, monitor symptoms Also discussed reflux as cause? Worse at night Consider famotidine?  Adenocarcinoma of left lung, stage 4 ([place]) Continue follow up with Oncology if symptoms don't improve. CT Q30months   Over 62min of face to face interview, exam, chart review, counseling preformed this office visit with moderate decision making.  Discussed med's effects and SE's. Screening labs and tests as requested with regular follow-up as recommended. Future Appointments  Date Time Provider [place]  06/04/2020  9:30 AM [facility] ONLY [facility] None  06/07/2020  9:45 AM Curt Bears, MD [facility] None  11/08/2020 11:00 AM [name], MD [facility] None    HPI  74 y.o. female  presents for follow up on UA and wheezing.   She reports she tried  Her blood pressure has been controlled at home, today their BP is BP: 124/82.  She does not workout. She denies chest pain, shortness of breath, dizziness.   Reports that she is having dysuria that started one week ago.  She reports the urine has been dark in color, denies any odor. She is also having sinus symptoms that started  10 days ago.  Runny nose and cough are her main symptoms.  She is having ear popping along with sinusitis. She reports that her cough is worst in late afternoon.  The cough is productive intermittently.  The cough does not wake her up at night.  She does wake up from whistling or wheezing at night.  Mucinex DM BID, she is taking cetirizine during the day and the Singulair every night.   Denies any water eyes, or sore throat.  She has been trying to walk the dog to move around more.  BMI is Body mass index is 33.65 kg/m., she is working on diet and exercise. Wt Readings from Last 3 Encounters:  05/06/20 166 lb 9.6 oz (75.6 kg)  04/13/20 167 lb (75.8 kg)  04/05/20 166 lb 8 oz (75.5 kg)   She has history of stage 4 lung cancer, on Glotinef maintenance therapy 30 mg in Feb/2021 and following with Dr. Julien Nordmann. Recent CT scan showed resolution of tumor.  Last OV was three days ago.  Got gel shot on left knee yesterday, has 2 more to go. This is the 3rd time, has bone on bone per patient. Dr. Latanya Maudlin.  She is on cholesterol medication and denies myalgias. Her cholesterol is at goal. The cholesterol last visit was:  Lab Results  Component Value Date   CHOL 187 04/13/2020   HDL 48 (L) 04/13/2020   LDLCALC 120 (H) 04/13/2020   TRIG 93 04/13/2020   CHOLHDL 3.9 04/13/2020  .  Last A1C in the office was:  Lab  Results  Component Value Date   HGBA1C 5.5 10/08/2019   Patient is on Vitamin D supplement.   Lab Results  Component Value Date   VD25OH 95 10/08/2019     She is on thyroid medication. Her medication was not changed last visit.She is not on biotin.   Lab Results  Component Value Date   TSH 2.53 04/13/2020  .   Current Medications:  Current Outpatient Medications on File Prior to Visit  Medication Sig Dispense Refill  . ALPRAZolam (XANAX) 1 MG tablet TAKE 1/2 TO 1 TABLET BY MOUTH 2-3 TIMES DAILY ONLY IF NEEDED FOR PANIC/ANXIETY, LIMIT TO 5 DAYS PER WEEK TO AVOID  ADDICTION/DEMENTIA 90 tablet 0  . aspirin 81 MG tablet Take 81 mg by mouth at bedtime.     . Cholecalciferol (VITAMIN D3) 10000 UNITS TABS Take 10,000 Units by mouth 3 (three) times a week.     . Cyanocobalamin (VITAMIN B 12 PO) Take 1 tablet by mouth daily.    Marland Kitchen gabapentin (NEURONTIN) 300 MG capsule Take 1 capsule 3 x /day for Pain 270 capsule 1  . GILOTRIF 30 MG tablet TAKE 1 TABLET (30MG ) BY MOUTH ONCE DAILY. TAKE ON AN EMPTY STOMACH 1 HOUR BEFORE OR 2 HOURS AFTER A MEAL 30 tablet 2  . ibuprofen (ADVIL,MOTRIN) 200 MG tablet Take 200 mg by mouth every 6 (six) hours as needed.    Marland Kitchen ipratropium (ATROVENT) 0.03 % nasal spray U 2 SPRAYS IEN TID    . levothyroxine (SYNTHROID) 50 MCG tablet Take 1 tablet daily on an empty stomach with only water for 30 minutes & no Antacid meds, Calcium or Magnesium for 4 hours & avoid Biotin 90 tablet 0  . Melatonin 10 MG CAPS Take 1 capsule by mouth at bedtime as needed.    . meloxicam (MOBIC) 15 MG tablet Take 15 mg by mouth daily.     . montelukast (SINGULAIR) 10 MG tablet Take 1 tablet Daily for Allergies 90 tablet 0  . mupirocin ointment (BACTROBAN) 2 % Place 1 application in the nose 2 x /day 22 g 3  . promethazine-codeine (PHENERGAN WITH CODEINE) 6.25-10 MG/5ML syrup Take 1 or 2 teaspoonful every 4 hours as needed for Cough / Congestion 360 mL 1   No current facility-administered medications on file prior to visit.    Health Maintenance:   Immunization History  Administered Date(s) Administered  . Influenza Split 07/16/2018  . Influenza, High Dose Seasonal PF 07/31/2014, 08/06/2017, 07/09/2019  . Influenza-Unspecified 09/13/2015, 07/23/2016, 08/06/2017, 07/23/2018  . Moderna SARS-COVID-2 Vaccination 12/05/2019, 01/03/2020  . Pneumococcal Conjugate-13 12/24/2015  . Pneumococcal Polysaccharide-23 07/22/2012  . Td 12/25/2005  . Zoster 01/09/2007   Tetanus: 2007 DUE declines Pneumovax: 2013 Prevnar: 2017 Flu vaccine:2019 Zostavax:2008 discussed new  shot- has not had yet  Pap: Hysterectomy MGM:10/31/2018 will get in [place]: 10/2018 will get with MGM Colonoscopy: 2014 PET 2016 MRI brain 2016 CT AB/chest 02/2019  Patient Care Team: [name], MD as PCP - General (Internal Medicine) Sharol Roussel, [place] as Physician Assistant (Optometry) Jettie Booze, MD as Consulting Physician (Interventional Cardiology) Teena Irani, MD (Inactive) as Consulting Physician (Gastroenterology) Druscilla Brownie, MD as Consulting Physician (Dermatology) Clent Jacks, MD as Consulting Physician (Ophthalmology) Erroll Luna, MD as Consulting Physician (General Surgery) [place] Nakayama, MD as Consulting Physician (Orthopedic Surgery)  Medical History:  Past Medical History:  Diagnosis Date  . Adenocarcinoma of left lung, stage 4 ([place]) 04/05/2015   Biopsy confirmed CT SCAN: There are innumerable  bilateral pulmonary nodules. These range in size from about 5 mm to 2 cm. They demonstrate irregular indistinct borders, the larger ones demonstrating spiculation. PET SCAN: Diffuse pulmonary metastatic disease with a 4 cm dominant left lower low lung lesion. No enlarged or hypermetabolic mediastinal or hilar adenopathy.  . Allergy   . Anxiety   . Cancer ([place])   . Change of skin related to chemotherapy 09/25/2017  . Drug-induced skin rash 01/17/2016  . Hyperlipidemia   . Hypertension   . Insomnia   . Prediabetes   . Thyroid disease    Allergies Allergies  Allergen Reactions  . Penicillins Swelling and Rash   SURGICAL HISTORY She  has a past surgical history that includes Tonsillectomy and adenoidectomy and Abdominal hysterectomy (1995). FAMILY HISTORY Her family history includes ALS in her father; Alcohol abuse in her mother and paternal grandfather; Bone cancer in her maternal uncle; Breast cancer in her cousin; Dementia (age of onset: 9) in her paternal aunt; Dementia (age of onset: 55) in her maternal grandmother; Heart disease in her  paternal grandfather; Hypertension in her father; Liver disease in her mother; Lung cancer (age of onset: 64) in her paternal grandmother; Lung disease in her maternal grandfather; Melanoma in her paternal aunt. SOCIAL HISTORY She  reports that she quit smoking about 45 years ago. She has a 5.00 pack-year smoking history. She has never used smokeless tobacco. She reports that she does not drink alcohol and does not use drugs.    Review of Systems: Review of Systems  Constitutional: Negative for chills, fever and malaise/fatigue.  HENT: Negative for congestion, ear pain and sore throat.   Eyes: Negative.   Respiratory: Positive for hemoptysis, sputum production, shortness of breath and wheezing. Negative for cough.   Cardiovascular: Negative for chest pain, palpitations and leg swelling.  Gastrointestinal: Negative for abdominal pain, blood in stool, constipation, diarrhea, heartburn and melena.  Genitourinary: Negative.   Musculoskeletal: Negative for back pain, falls, joint pain, myalgias and neck pain.  Skin: Negative for itching and rash.  Neurological: Negative for dizziness, sensory change, loss of consciousness and headaches.  Psychiatric/Behavioral: Negative for depression. The patient is not nervous/anxious and does not have insomnia.     Physical Exam: Estimated body mass index is 33.65 kg/m as calculated from the following:   Height as of 04/13/20: 4\' 11"  (1.499 m).   Weight as of this encounter: 166 lb 9.6 oz (75.6 kg). BP 124/82   Pulse 63   Temp 98.3 F (36.8 C)   Wt 166 lb 9.6 oz (75.6 kg)   SpO2 98%   BMI 33.65 kg/m   General Appearance: Well nourished well developed, in no apparent distress.  Eyes: PERRLA, EOMs, conjunctiva no swelling or erythema ENT/Mouth: Ear canals normal without obstruction, swelling, erythema, or discharge.  TMs normal bilaterally with no erythema, bulging, retraction, or loss of landmark.  Oropharynx moist and clear with no exudate, erythema,  or swelling.  Frontal and maxillary tenderness noted. Neck: Supple, thyroid normal. No bruits.  No cervical adenopathy Respiratory: Respiratory effort normal, Breath sounds without rales.  Bilateral wheezing and rhonchi noted. Cardio: RRR without murmurs, rubs or gallops. Brisk peripheral pulses without edema.  retractions.  Abdomen: Soft, nontender, no guarding, rebound, hernias, masses, or organomegaly.  Lymphatics: Non tender without lymphadenopathy.  Musculoskeletal: Full ROM all peripheral extremities,5/5 strength, and normal gait.  Skin:  Non-tender, warm and dry Neuro: Awake and oriented X 3, Cranial nerves intact, reflexes equal bilaterally. Normal muscle tone, no cerebellar  symptoms. Sensation intact.  Psych:  normal affect, Insight and Judgment appropriate.     [name], NP 10:58 PM [facility]

## 2020-05-06 NOTE — Patient Instructions (Addendum)
  Try taking Zyrtec (Cetirazine).  This is stronger than Claritin.  If this is not effective with the wheezing try Xyzal (Levocitirazine).  Allegra (fexofenadine) is in another antihistamine class and one you could also try.   Continue taking the singular (motelukast).   We will re-check your urine today and respond via MyChart with the results.

## 2020-05-08 LAB — URINALYSIS W MICROSCOPIC + REFLEX CULTURE
Bacteria, UA: NONE SEEN /HPF
Bilirubin Urine: NEGATIVE
Glucose, UA: NEGATIVE
Hgb urine dipstick: NEGATIVE
Hyaline Cast: NONE SEEN /LPF
Ketones, ur: NEGATIVE
Nitrites, Initial: NEGATIVE
Protein, ur: NEGATIVE
RBC / HPF: NONE SEEN /HPF (ref 0–2)
Specific Gravity, Urine: 1.012 (ref 1.001–1.03)
Squamous Epithelial / HPF: NONE SEEN /HPF (ref <=5)
pH: 5.5 (ref 5.0–8.0)

## 2020-05-08 LAB — URINE CULTURE
MICRO NUMBER:: 10712483
Result:: NO GROWTH
SPECIMEN QUALITY:: ADEQUATE

## 2020-05-08 LAB — CULTURE INDICATED

## 2020-05-27 MED FILL — GILOTRIF 30 MG TABLET: 30 | 30 days supply | Qty: 30 | Fill #1

## 2020-06-04 ENCOUNTER — Other Ambulatory Visit: Payer: Self-pay

## 2020-06-04 ENCOUNTER — Inpatient Hospital Stay: Payer: Medicare Other | Attending: Internal Medicine

## 2020-06-04 DIAGNOSIS — Z880 Allergy status to penicillin: Secondary | ICD-10-CM | POA: Diagnosis not present

## 2020-06-04 DIAGNOSIS — R062 Wheezing: Secondary | ICD-10-CM | POA: Diagnosis not present

## 2020-06-04 DIAGNOSIS — C3492 Malignant neoplasm of unspecified part of left bronchus or lung: Secondary | ICD-10-CM

## 2020-06-04 DIAGNOSIS — R21 Rash and other nonspecific skin eruption: Secondary | ICD-10-CM | POA: Diagnosis not present

## 2020-06-04 DIAGNOSIS — I10 Essential (primary) hypertension: Secondary | ICD-10-CM | POA: Insufficient documentation

## 2020-06-04 DIAGNOSIS — R0602 Shortness of breath: Secondary | ICD-10-CM | POA: Insufficient documentation

## 2020-06-04 DIAGNOSIS — R0609 Other forms of dyspnea: Secondary | ICD-10-CM | POA: Insufficient documentation

## 2020-06-04 DIAGNOSIS — C3432 Malignant neoplasm of lower lobe, left bronchus or lung: Secondary | ICD-10-CM | POA: Diagnosis present

## 2020-06-04 DIAGNOSIS — E785 Hyperlipidemia, unspecified: Secondary | ICD-10-CM | POA: Insufficient documentation

## 2020-06-04 DIAGNOSIS — Z90722 Acquired absence of ovaries, bilateral: Secondary | ICD-10-CM | POA: Insufficient documentation

## 2020-06-04 DIAGNOSIS — E039 Hypothyroidism, unspecified: Secondary | ICD-10-CM | POA: Insufficient documentation

## 2020-06-04 DIAGNOSIS — Z79899 Other long term (current) drug therapy: Secondary | ICD-10-CM | POA: Diagnosis not present

## 2020-06-04 LAB — CBC WITH DIFFERENTIAL (CANCER CENTER ONLY)
Abs Immature Granulocytes: 0.03 10*3/uL (ref 0.00–0.07)
Basophils Absolute: 0.1 10*3/uL (ref 0.0–0.1)
Basophils Relative: 1 %
Eosinophils Absolute: 0.2 10*3/uL (ref 0.0–0.5)
Eosinophils Relative: 4 %
HCT: 39.3 % (ref 36.0–46.0)
Hemoglobin: 12.5 g/dL (ref 12.0–15.0)
Immature Granulocytes: 1 %
Lymphocytes Relative: 22 %
Lymphs Abs: 1.3 10*3/uL (ref 0.7–4.0)
MCH: 27.4 pg (ref 26.0–34.0)
MCHC: 31.8 g/dL (ref 30.0–36.0)
MCV: 86.2 fL (ref 80.0–100.0)
Monocytes Absolute: 0.7 10*3/uL (ref 0.1–1.0)
Monocytes Relative: 11 %
Neutro Abs: 3.6 10*3/uL (ref 1.7–7.7)
Neutrophils Relative %: 61 %
Platelet Count: 179 10*3/uL (ref 150–400)
RBC: 4.56 MIL/uL (ref 3.87–5.11)
RDW: 13.3 % (ref 11.5–15.5)
WBC Count: 5.9 10*3/uL (ref 4.0–10.5)
nRBC: 0 % (ref 0.0–0.2)

## 2020-06-04 LAB — CMP (CANCER CENTER ONLY)
ALT: 21 U/L (ref 0–44)
AST: 24 U/L (ref 15–41)
Albumin: 3.6 g/dL (ref 3.5–5.0)
Alkaline Phosphatase: 74 U/L (ref 38–126)
Anion gap: 11 (ref 5–15)
BUN: 17 mg/dL (ref 8–23)
CO2: 23 mmol/L (ref 22–32)
Calcium: 9.6 mg/dL (ref 8.9–10.3)
Chloride: 106 mmol/L (ref 98–111)
Creatinine: 1.02 mg/dL — ABNORMAL HIGH (ref 0.44–1.00)
GFR, Est AFR Am: >60 mL/min (ref >60)
GFR, Estimated: 55 mL/min — ABNORMAL LOW (ref >60)
Glucose, Bld: 92 mg/dL (ref 70–99)
Potassium: 4.4 mmol/L (ref 3.5–5.1)
Sodium: 140 mmol/L (ref 135–145)
Total Bilirubin: 0.9 mg/dL (ref 0.3–1.2)
Total Protein: 6.9 g/dL (ref 6.5–8.1)

## 2020-06-07 ENCOUNTER — Encounter: Payer: Self-pay | Admitting: Internal Medicine

## 2020-06-07 ENCOUNTER — Other Ambulatory Visit: Payer: Self-pay

## 2020-06-07 ENCOUNTER — Inpatient Hospital Stay (HOSPITAL_BASED_OUTPATIENT_CLINIC_OR_DEPARTMENT_OTHER): Payer: Medicare Other | Admitting: Internal Medicine

## 2020-06-07 VITALS — BP 145/76 | HR 75 | Temp 97.7°F | Resp 17 | Ht 59.0 in | Wt 165.9 lb

## 2020-06-07 DIAGNOSIS — C3432 Malignant neoplasm of lower lobe, left bronchus or lung: Secondary | ICD-10-CM | POA: Diagnosis not present

## 2020-06-07 DIAGNOSIS — C3490 Malignant neoplasm of unspecified part of unspecified bronchus or lung: Secondary | ICD-10-CM

## 2020-06-07 DIAGNOSIS — Z5111 Encounter for antineoplastic chemotherapy: Secondary | ICD-10-CM

## 2020-06-07 DIAGNOSIS — I10 Essential (primary) hypertension: Secondary | ICD-10-CM

## 2020-06-07 DIAGNOSIS — C3492 Malignant neoplasm of unspecified part of left bronchus or lung: Secondary | ICD-10-CM | POA: Diagnosis not present

## 2020-06-07 MED ORDER — METHYLPREDNISOLONE 4 MG PO TBPK
ORAL_TABLET | ORAL | 0 refills | Status: DC
Start: 2020-06-07 — End: 2020-11-19

## 2020-06-07 NOTE — Progress Notes (Signed)
[facility] [phone]   Fax:([phone]  OFFICE PROGRESS NOTE  [name], MD 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 2) SBRT to the enlarging left upper lobe nodule under the care of Dr. Lisbeth Renshaw.  CURRENT THERAPY: Gilotrif 30 mg by mouth daily started 02/21/2016 status post 51 months of treatment.  INTERVAL HISTORY: Elizabeth [name] 74 y.o. female returns to the clinic today for follow-up visit.  The patient is feeling fine today with no concerning complaints except for wheezing.  She denied having any chest pain but has shortness of breath with exertion with no cough or hemoptysis.  She denied having any fever or chills.  She has no nausea, vomiting, diarrhea or constipation.  She has no headache or visual changes.  The patient continues to tolerate her treatments with Gilotrif fairly well.  She is here today for evaluation and repeat blood work.  MEDICAL HISTORY: Past Medical History:  Diagnosis Date  . Adenocarcinoma of left lung, stage 4 ([place]) 04/05/2015   Biopsy confirmed CT SCAN: There are innumerable bilateral pulmonary nodules. These range in size from about 5 mm to 2 cm. They demonstrate irregular indistinct borders, the larger ones demonstrating spiculation. PET SCAN: Diffuse pulmonary metastatic disease with a 4 cm dominant left lower low lung lesion. No enlarged or hypermetabolic mediastinal or hilar adenopathy.  . Allergy   . Anxiety   . Cancer ([place])   . Change of skin related to chemotherapy 09/25/2017  .  Drug-induced skin rash 01/17/2016  . Hyperlipidemia   . Hypertension   . Insomnia   . Prediabetes   . Thyroid disease     ALLERGIES:  is allergic to penicillins.  MEDICATIONS:  Current Outpatient Medications  Medication Sig Dispense Refill  . ALPRAZolam (XANAX) 1 MG tablet TAKE 1/2 TO 1 TABLET BY MOUTH 2-3 TIMES DAILY ONLY IF NEEDED FOR PANIC/ANXIETY, LIMIT TO 5 DAYS PER WEEK TO AVOID ADDICTION/DEMENTIA 90 tablet 0  . aspirin 81 MG tablet Take 81 mg by mouth at bedtime.     . Cholecalciferol (VITAMIN D3) 10000 UNITS TABS Take 10,000 Units by mouth 3 (three) times a week.     . Cyanocobalamin (VITAMIN B 12 PO) Take 1 tablet by mouth daily.    Marland Kitchen gabapentin (NEURONTIN) 300 MG capsule Take 1 capsule 3 x /day for Pain 270 capsule 1  . GILOTRIF 30 MG tablet TAKE 1 TABLET (30MG) BY MOUTH ONCE DAILY. TAKE ON AN EMPTY STOMACH 1 HOUR BEFORE OR 2 HOURS AFTER A MEAL 30 tablet 2  . ibuprofen (ADVIL,MOTRIN) 200 MG tablet Take 200 mg by mouth every 6 (six) hours as needed.    Marland Kitchen ipratropium (ATROVENT) 0.03 % nasal spray U 2 SPRAYS IEN TID    . levothyroxine (SYNTHROID) 50 MCG tablet Take 1 tablet daily on an empty stomach with only water for 30 minutes & no Antacid meds, Calcium or Magnesium for 4 hours & avoid Biotin 90 tablet 0  . Melatonin 10 MG CAPS Take 1 capsule by mouth at bedtime as needed.    Marland Kitchen  meloxicam (MOBIC) 15 MG tablet Take 15 mg by mouth daily.     . montelukast (SINGULAIR) 10 MG tablet Take 1 tablet Daily for Allergies 90 tablet 0  . mupirocin ointment (BACTROBAN) 2 % Place 1 application in the nose 2 x /day 22 g 3  . promethazine-codeine (PHENERGAN WITH CODEINE) 6.25-10 MG/5ML syrup Take 1 or 2 teaspoonful every 4 hours as needed for Cough / Congestion 360 mL 1   No current facility-administered medications for this visit.    SURGICAL HISTORY:  Past Surgical History:  Procedure Laterality Date  . ABDOMINAL HYSTERECTOMY  1995   w BSO  . TONSILLECTOMY AND ADENOIDECTOMY       REVIEW OF SYSTEMS:  A comprehensive review of systems was negative except for: Constitutional: positive for fatigue Respiratory: positive for dyspnea on exertion and wheezing   PHYSICAL EXAMINATION: General appearance: alert, cooperative and no distress Head: Normocephalic, without obvious abnormality, atraumatic Neck: no adenopathy, no JVD, supple, symmetrical, trachea midline and thyroid not enlarged, symmetric, no tenderness/mass/nodules Lymph nodes: Cervical, supraclavicular, and axillary nodes normal. Resp: wheezes bilaterally Back: symmetric, no curvature. ROM normal. No CVA tenderness. Cardio: regular rate and rhythm, S1, S2 normal, no murmur, click, rub or gallop GI: soft, non-tender; bowel sounds normal; no masses,  no organomegaly Extremities: extremities normal, atraumatic, no cyanosis or edema  ECOG PERFORMANCE STATUS: 1 - Symptomatic but completely ambulatory  Blood pressure (!) 145/76, pulse 75, temperature 97.7 F (36.5 C), temperature source Axillary, resp. rate 17, height 4' 11" (1.499 m), weight 165 lb 14.4 oz (75.3 kg), SpO2 98 %.  LABORATORY DATA: Lab Results  Component Value Date   WBC 5.9 06/04/2020   HGB 12.5 06/04/2020   HCT 39.3 06/04/2020   MCV 86.2 06/04/2020   PLT 179 06/04/2020      Chemistry      Component Value Date/Time   NA 140 06/04/2020 0910   NA 139 10/26/2017 1252   K 4.4 06/04/2020 0910   K 4.1 10/26/2017 1252   CL 106 06/04/2020 0910   CO2 23 06/04/2020 0910   CO2 28 10/26/2017 1252   BUN 17 06/04/2020 0910   BUN 15.7 10/26/2017 1252   CREATININE 1.02 (H) 06/04/2020 0910   CREATININE 1.16 (H) 04/13/2020 0000   CREATININE 1.0 10/26/2017 1252      Component Value Date/Time   CALCIUM 9.6 06/04/2020 0910   CALCIUM 8.8 10/26/2017 1252   ALKPHOS 74 06/04/2020 0910   ALKPHOS 70 10/26/2017 1252   AST 24 06/04/2020 0910   AST 19 10/26/2017 1252   ALT 21 06/04/2020 0910   ALT 22 10/26/2017 1252   BILITOT 0.9 06/04/2020 0910    BILITOT 0.90 10/26/2017 1252       RADIOGRAPHIC STUDIES: No results found.  ASSESSMENT AND PLAN:  This is a very pleasant 74 years old white female with stage IV non-small cell lung cancer, adenocarcinoma with positive EGFR mutation with deletion in exon 19 and currently undergoing treatment with Gilotrif initially at a dose of 40 mg for 9 months followed by 30 mg by mouth daily status post 51 months.  She also underwent SBRT to the enlarging left upper lobe lung nodule. The patient continues to tolerate her treatment with Gilotrif well. Her lab work is unremarkable today. I recommended for her to continue with the same dose of Gilotrif. I will see her back for follow-up visit in 2 months for evaluation with repeat CT scan of the chest, abdomen pelvis for restaging of her  disease. For the wheezing and shortness of breath, I will start the patient on Medrol Dosepak and she was advised to take her inhaler as needed. For the skin rash she will continue with the clindamycin lotion. The patient was advised to call immediately if she has any concerning symptoms in the interval. The patient voices understanding of current disease status and treatment options and is in agreement with the current care plan. All questions were answered. The patient knows to call the clinic with any problems, questions or concerns. We can certainly see the patient much sooner if necessary.  Disclaimer: This note was dictated with voice recognition software. Similar sounding words can inadvertently be transcribed and may not be corrected upon review.

## 2020-06-23 MED FILL — GILOTRIF 30 MG TABLET: 30 | 30 days supply | Qty: 30 | Fill #2

## 2020-07-03 ENCOUNTER — Other Ambulatory Visit: Payer: Self-pay | Admitting: Internal Medicine

## 2020-07-03 DIAGNOSIS — E039 Hypothyroidism, unspecified: Secondary | ICD-10-CM

## 2020-07-18 ENCOUNTER — Other Ambulatory Visit: Payer: Self-pay | Admitting: Internal Medicine

## 2020-07-20 ENCOUNTER — Other Ambulatory Visit: Payer: Self-pay | Admitting: Internal Medicine

## 2020-07-28 MED FILL — GILOTRIF 30 MG TABLET: 30 | 30 days supply | Qty: 30 | Fill #0

## 2020-08-06 ENCOUNTER — Ambulatory Visit (HOSPITAL_COMMUNITY)
Admission: RE | Admit: 2020-08-06 | Discharge: 2020-08-06 | Disposition: A | Discharge status: Home / Self Care | Payer: Medicare Other | Source: Ambulatory Visit | Attending: Internal Medicine | Admitting: Internal Medicine

## 2020-08-06 ENCOUNTER — Other Ambulatory Visit: Payer: Self-pay

## 2020-08-06 ENCOUNTER — Inpatient Hospital Stay: Payer: Medicare Other | Attending: Internal Medicine

## 2020-08-06 DIAGNOSIS — R0602 Shortness of breath: Secondary | ICD-10-CM | POA: Insufficient documentation

## 2020-08-06 DIAGNOSIS — C3432 Malignant neoplasm of lower lobe, left bronchus or lung: Secondary | ICD-10-CM | POA: Diagnosis not present

## 2020-08-06 DIAGNOSIS — Z79899 Other long term (current) drug therapy: Secondary | ICD-10-CM | POA: Insufficient documentation

## 2020-08-06 DIAGNOSIS — I700 Atherosclerosis of aorta: Secondary | ICD-10-CM | POA: Diagnosis not present

## 2020-08-06 DIAGNOSIS — R062 Wheezing: Secondary | ICD-10-CM | POA: Diagnosis not present

## 2020-08-06 DIAGNOSIS — E079 Disorder of thyroid, unspecified: Secondary | ICD-10-CM | POA: Insufficient documentation

## 2020-08-06 DIAGNOSIS — I10 Essential (primary) hypertension: Secondary | ICD-10-CM | POA: Diagnosis not present

## 2020-08-06 DIAGNOSIS — R5383 Other fatigue: Secondary | ICD-10-CM | POA: Diagnosis not present

## 2020-08-06 DIAGNOSIS — Z90722 Acquired absence of ovaries, bilateral: Secondary | ICD-10-CM | POA: Diagnosis not present

## 2020-08-06 DIAGNOSIS — R21 Rash and other nonspecific skin eruption: Secondary | ICD-10-CM | POA: Insufficient documentation

## 2020-08-06 DIAGNOSIS — K449 Diaphragmatic hernia without obstruction or gangrene: Secondary | ICD-10-CM | POA: Insufficient documentation

## 2020-08-06 DIAGNOSIS — R0609 Other forms of dyspnea: Secondary | ICD-10-CM | POA: Diagnosis not present

## 2020-08-06 DIAGNOSIS — E785 Hyperlipidemia, unspecified: Secondary | ICD-10-CM | POA: Diagnosis not present

## 2020-08-06 DIAGNOSIS — C3490 Malignant neoplasm of unspecified part of unspecified bronchus or lung: Secondary | ICD-10-CM

## 2020-08-06 DIAGNOSIS — Z880 Allergy status to penicillin: Secondary | ICD-10-CM | POA: Diagnosis not present

## 2020-08-06 LAB — CBC WITH DIFFERENTIAL (CANCER CENTER ONLY)
Abs Immature Granulocytes: 0.02 10*3/uL (ref 0.00–0.07)
Basophils Absolute: 0.1 10*3/uL (ref 0.0–0.1)
Basophils Relative: 1 %
Eosinophils Absolute: 0.2 10*3/uL (ref 0.0–0.5)
Eosinophils Relative: 4 %
HCT: 39.9 % (ref 36.0–46.0)
Hemoglobin: 12.9 g/dL (ref 12.0–15.0)
Immature Granulocytes: 0 %
Lymphocytes Relative: 24 %
Lymphs Abs: 1.4 10*3/uL (ref 0.7–4.0)
MCH: 27.3 pg (ref 26.0–34.0)
MCHC: 32.3 g/dL (ref 30.0–36.0)
MCV: 84.5 fL (ref 80.0–100.0)
Monocytes Absolute: 0.6 10*3/uL (ref 0.1–1.0)
Monocytes Relative: 10 %
Neutro Abs: 3.6 10*3/uL (ref 1.7–7.7)
Neutrophils Relative %: 61 %
Platelet Count: 194 10*3/uL (ref 150–400)
RBC: 4.72 MIL/uL (ref 3.87–5.11)
RDW: 13 % (ref 11.5–15.5)
WBC Count: 5.9 10*3/uL (ref 4.0–10.5)
nRBC: 0 % (ref 0.0–0.2)

## 2020-08-06 LAB — CMP (CANCER CENTER ONLY)
ALT: 21 U/L (ref 0–44)
AST: 20 U/L (ref 15–41)
Albumin: 3.5 g/dL (ref 3.5–5.0)
Alkaline Phosphatase: 80 U/L (ref 38–126)
Anion gap: 5 (ref 5–15)
BUN: 16 mg/dL (ref 8–23)
CO2: 30 mmol/L (ref 22–32)
Calcium: 9.3 mg/dL (ref 8.9–10.3)
Chloride: 106 mmol/L (ref 98–111)
Creatinine: 1.05 mg/dL — ABNORMAL HIGH (ref 0.44–1.00)
GFR, Estimated: 53 mL/min — ABNORMAL LOW (ref >60)
Glucose, Bld: 88 mg/dL (ref 70–99)
Potassium: 4.3 mmol/L (ref 3.5–5.1)
Sodium: 141 mmol/L (ref 135–145)
Total Bilirubin: 0.8 mg/dL (ref 0.3–1.2)
Total Protein: 7.1 g/dL (ref 6.5–8.1)

## 2020-08-06 MED ORDER — IOHEXOL 300 MG/ML  SOLN
100.0000 mL | Freq: Once | INTRAMUSCULAR | Status: AC | PRN
  Administered 2020-08-06: 100 mL via INTRAVENOUS

## 2020-08-09 ENCOUNTER — Inpatient Hospital Stay (HOSPITAL_BASED_OUTPATIENT_CLINIC_OR_DEPARTMENT_OTHER): Payer: Medicare Other | Admitting: Internal Medicine

## 2020-08-09 ENCOUNTER — Other Ambulatory Visit: Payer: Self-pay

## 2020-08-09 ENCOUNTER — Encounter: Payer: Self-pay | Admitting: Internal Medicine

## 2020-08-09 ENCOUNTER — Encounter: Payer: Self-pay | Admitting: *Deleted

## 2020-08-09 VITALS — BP 135/76 | HR 74 | Temp 98.9°F | Resp 18 | Ht 59.0 in | Wt 166.8 lb

## 2020-08-09 DIAGNOSIS — Z5111 Encounter for antineoplastic chemotherapy: Secondary | ICD-10-CM

## 2020-08-09 DIAGNOSIS — I10 Essential (primary) hypertension: Secondary | ICD-10-CM | POA: Diagnosis not present

## 2020-08-09 DIAGNOSIS — C3432 Malignant neoplasm of lower lobe, left bronchus or lung: Secondary | ICD-10-CM | POA: Diagnosis not present

## 2020-08-09 DIAGNOSIS — C3492 Malignant neoplasm of unspecified part of left bronchus or lung: Secondary | ICD-10-CM | POA: Diagnosis not present

## 2020-08-09 DIAGNOSIS — C7802 Secondary malignant neoplasm of left lung: Secondary | ICD-10-CM | POA: Diagnosis not present

## 2020-08-09 NOTE — Progress Notes (Signed)
[facility] [place] [phone]   [phone]  OFFICE PROGRESS NOTE  [name], MD [address] [place] [address]  DIAGNOSIS: Stage IV (T2a, N0, M1a) non-small cell lung cancer, adenocarcinoma with positive EGFR mutation with deletion in exon 19 diagnosed in June 2016 and presented with large mass in the left lower lobe in addition to multiple bilateral pulmonary nodules  PRIOR THERAPY:  1) Gilotrif 40 mg by mouth daily started 05/11/2015, status post 9 months of treatment discontinued on 02/17/2016 secondary to extensive skin rash. 2) SBRT to the enlarging left upper lobe nodule under the care of Dr. Lisbeth Renshaw.  CURRENT THERAPY: Gilotrif 30 mg by mouth daily started 02/21/2016 status post 53 months of treatment.  INTERVAL HISTORY: Elizabeth [name] 74 y.o. female    MEDICAL HISTORY: Past Medical History:  Diagnosis Date  . Adenocarcinoma of left lung, stage 4 ([place] [facility]) 04/05/2015   Biopsy confirmed CT SCAN: There are innumerable bilateral pulmonary nodules. These range in size from about 5 mm to 2 cm. They demonstrate irregular indistinct borders, the larger ones demonstrating spiculation. PET SCAN: Diffuse pulmonary metastatic disease with a 4 cm dominant left lower low lung lesion. No enlarged or hypermetabolic mediastinal or hilar adenopathy.  . Allergy   . Anxiety   . Cancer ([facility])   . Change of skin related to chemotherapy 09/25/2017  . Drug-induced skin rash 01/17/2016  . Hyperlipidemia   . Hypertension   . Insomnia   . Prediabetes   . Thyroid disease     ALLERGIES:  is allergic to penicillins.  MEDICATIONS:  Current Outpatient Medications  Medication Sig Dispense Refill  . ALPRAZolam (XANAX) 1 MG tablet TAKE 1/2 TO 1 TABLET BY MOUTH 2 TO 3 TIMES DAILY ONLY IF NEEDED FOR PANIC/ANXIETY LIMIT TO 5 DAYS PER WEEK TO AVOID ADDICTION/DEMENTIA 90 tablet 0  . aspirin 81 MG tablet Take 81 mg by mouth at bedtime.     .  Cholecalciferol (VITAMIN D3) 10000 UNITS TABS Take 10,000 Units by mouth 3 (three) times a week.     . Cyanocobalamin (VITAMIN B 12 PO) Take 1 tablet by mouth daily.    Marland Kitchen gabapentin (NEURONTIN) 300 MG capsule Take 1 capsule 3 x /day for Pain 270 capsule 1  . GILOTRIF 30 MG tablet TAKE 1 TABLET (30MG) BY MOUTH ONCE DAILY. TAKE ON AN EMPTY STOMACH 1 HOUR BEFORE OR 2 HOURS AFTER A MEAL 30 tablet 2  . ibuprofen (ADVIL,MOTRIN) 200 MG tablet Take 200 mg by mouth every 6 (six) hours as needed.    Marland Kitchen ipratropium (ATROVENT) 0.03 % nasal spray U 2 SPRAYS IEN TID    . levothyroxine (SYNTHROID) 50 MCG tablet TAKE 1 TABLET BY MOUTH DAILY ON AN EMPTY STOMACH WITH ONLY WATER FOR 30 MINUTES. NO ACTACID, CALCIUM, OR MAGNESIUM FOR 4 HOURS. AVOID BIOTIN. 90 tablet 1  . Melatonin 10 MG CAPS Take 1 capsule by mouth at bedtime as needed.    . meloxicam (MOBIC) 15 MG tablet Take 15 mg by mouth daily.     . methylPREDNISolone (MEDROL DOSEPAK) 4 MG TBPK tablet Use as instructed. 21 tablet 0  . montelukast (SINGULAIR) 10 MG tablet TAKE 1 TABLET BY MOUTH DAILY FOR ALLERGIES 90 tablet 3  . mupirocin ointment (BACTROBAN) 2 % Place 1 application in the nose 2 x /day 22 g 3  . Promethazine-Codeine 6.25-10 MG/5ML SOLN TAKE 5 TO 10 ML BY MOUTH EVERY 4 HOURS AS NEEDED FOR  COUGH OR CONGESTION 360 mL 0   No current facility-administered medications for this visit.    SURGICAL HISTORY:  Past Surgical History:  Procedure Laterality Date  . ABDOMINAL HYSTERECTOMY  1995   w BSO  . TONSILLECTOMY AND ADENOIDECTOMY      REVIEW OF SYSTEMS:  A comprehensive review of systems was negative except for: Constitutional: positive for fatigue Respiratory: positive for dyspnea on exertion and wheezing   PHYSICAL EXAMINATION: General appearance: alert, cooperative and no distress Head: Normocephalic, without obvious abnormality, atraumatic Neck: no adenopathy, no JVD, supple, symmetrical, trachea midline and thyroid not enlarged, symmetric,  no tenderness/mass/nodules Lymph nodes: Cervical, supraclavicular, and axillary nodes normal. Resp: wheezes bilaterally Back: symmetric, no curvature. ROM normal. No CVA tenderness. Cardio: regular rate and rhythm, S1, S2 normal, no murmur, click, rub or gallop GI: soft, non-tender; bowel sounds normal; no masses,  no organomegaly Extremities: extremities normal, atraumatic, no cyanosis or edema  ECOG PERFORMANCE STATUS: 1 - Symptomatic but completely ambulatory  There were no vitals taken for this visit.  LABORATORY DATA: Lab Results  Component Value Date   WBC 5.9 08/06/2020   HGB 12.9 08/06/2020   HCT 39.9 08/06/2020   MCV 84.5 08/06/2020   PLT 194 08/06/2020      Chemistry      Component Value Date/Time   NA 141 08/06/2020 0915   NA 139 10/26/2017 1252   K 4.3 08/06/2020 0915   K 4.1 10/26/2017 1252   CL 106 08/06/2020 0915   CO2 30 08/06/2020 0915   CO2 28 10/26/2017 1252   BUN 16 08/06/2020 0915   BUN 15.7 10/26/2017 1252   CREATININE 1.05 (H) 08/06/2020 0915   CREATININE 1.16 (H) 04/13/2020 0000   CREATININE 1.0 10/26/2017 1252      Component Value Date/Time   CALCIUM 9.3 08/06/2020 0915   CALCIUM 8.8 10/26/2017 1252   ALKPHOS 80 08/06/2020 0915   ALKPHOS 70 10/26/2017 1252   AST 20 08/06/2020 0915   AST 19 10/26/2017 1252   ALT 21 08/06/2020 0915   ALT 22 10/26/2017 1252   BILITOT 0.8 08/06/2020 0915   BILITOT 0.90 10/26/2017 1252       RADIOGRAPHIC STUDIES: CT Chest W Contrast  Result Date: 08/06/2020 CLINICAL DATA:  Primary Cancer Type: Lung Imaging Indication: Routine surveillance Interval therapy since last imaging? Yes Initial Cancer Diagnosis Date: 04/01/2015; Established by: Biopsy-proven Detailed Pathology: Stage IV non-small cell lung cancer, adenocarcinoma. Primary Tumor location:  Left lower lobe. Recurrence? Yes; Date(s) of recurrence: 07/18/2019; Established by: Imaging only; Left upper lobe. Surgeries: Hysterectomy. Chemotherapy: Yes;  Ongoing? Yes; Most recent administration: Gilotrif, daily since 2016. Immunotherapy? No Radiation therapy? Yes; Date Range: 12/11/2019 - 12/18/2019; Target: Left lung EXAM: CT CHEST, ABDOMEN, AND PELVIS WITH CONTRAST TECHNIQUE: Multidetector CT imaging of the chest, abdomen and pelvis was performed following the standard protocol during bolus administration of intravenous contrast. CONTRAST:  136m OMNIPAQUE IOHEXOL 300 MG/ML  SOLN COMPARISON:  Most recent CT chest, abdomen and pelvis 04/02/2020. FINDINGS: CT CHEST FINDINGS Cardiovascular: Minimal atherosclerotic change in the thoracic aorta, no aneurysmal dilation. Heart size is normal without pericardial effusion. Central pulmonary vasculature is unremarkable on venous phase assessment. Mediastinum/Nodes: No thoracic inlet or axillary lymphadenopathy. No mediastinal lymphadenopathy. LEFT hilum with perihilar soft tissue similar to the prior study. No adenopathy in the chest. Lungs/Pleura: Post treatment changes with volume loss of bronchiectatic changes in the superior segment of the LEFT lower lobe and along the fissure in the LEFT upper lobe  with similar appearance. Scattered sub solid and ground-glass pulmonary nodules without change. For instance, on image 56 of series 4 there is an 11 x 9 mm nodule. The representative ground-glass nodule in the RIGHT chest in the superior segment of the RIGHT lower lobe measures 16 mm (image 56, series 4) Additional nodule in the RIGHT mid chest on image 78 of series 4 measures 12 x 11 mm in the RIGHT lower lobe, also stable. No new or suspicious findings in pulmonary parenchyma. No effusion. Musculoskeletal: See below for full musculoskeletal detail. No chest wall mass. CT ABDOMEN PELVIS FINDINGS Hepatobiliary: Posterior RIGHT hepatic lobe hemangioma is not changed. No pericholecystic stranding. No new or suspicious hepatic lesion. The portal vein is patent. No biliary duct dilation. Pancreas: Pancreas normal without ductal  dilation or sign of inflammation. Spleen: Spleen normal in size and contour. Adrenals/Urinary Tract: Adrenal glands are normal. Symmetric renal enhancement. No hydronephrosis. No suspicious renal lesion. Urinary bladder is normal. Stomach/Bowel: Small hiatal hernia. No acute gastrointestinal process. No pericolonic stranding. Signs of colonic diverticulosis as before. Appendix is normal. Vascular/Lymphatic: Mild calcified and noncalcified atheromatous plaque in the thoracic aorta. No aneurysmal dilation. There is no gastrohepatic or hepatoduodenal ligament lymphadenopathy. No retroperitoneal or mesenteric lymphadenopathy. No pelvic sidewall lymphadenopathy. Reproductive: Post hysterectomy. No adnexal mass. Urinary bladder is unremarkable. Other: No ascites. Musculoskeletal: No acute musculoskeletal process. No destructive bone finding. Grade 1 anterolisthesis of L4 on L5 is unchanged, disc bulging or extrusion may cause significant canal narrowing. Spinal degenerative changes. IMPRESSION: 1. Post treatment changes with volume loss of bronchiectatic changes in the superior segment of the LEFT lower lobe and along the fissure in the LEFT upper lobe with similar appearance. 2. Scattered sub solid and ground-glass pulmonary nodules without change. Continued attention on follow-up. 3. No evidence of metastatic disease in the abdomen or pelvis. 4. Grade 1 anterolisthesis of L4 on L5, disc bulging or extrusion may cause significant canal narrowing. Correlate with any radicular symptoms with further imaging with MRI as warranted. 5. Aortic atherosclerosis. Aortic Atherosclerosis (ICD10-I70.0). Electronically Signed   By: Zetta Bills M.D.   On: 08/06/2020 11:07   CT Abdomen Pelvis W Contrast  Result Date: 08/06/2020 CLINICAL DATA:  Primary Cancer Type: Lung Imaging Indication: Routine surveillance Interval therapy since last imaging? Yes Initial Cancer Diagnosis Date: 04/01/2015; Established by: Biopsy-proven  Detailed Pathology: Stage IV non-small cell lung cancer, adenocarcinoma. Primary Tumor location:  Left lower lobe. Recurrence? Yes; Date(s) of recurrence: 07/18/2019; Established by: Imaging only; Left upper lobe. Surgeries: Hysterectomy. Chemotherapy: Yes; Ongoing? Yes; Most recent administration: Gilotrif, daily since 2016. Immunotherapy? No Radiation therapy? Yes; Date Range: 12/11/2019 - 12/18/2019; Target: Left lung EXAM: CT CHEST, ABDOMEN, AND PELVIS WITH CONTRAST TECHNIQUE: Multidetector CT imaging of the chest, abdomen and pelvis was performed following the standard protocol during bolus administration of intravenous contrast. CONTRAST:  174m OMNIPAQUE IOHEXOL 300 MG/ML  SOLN COMPARISON:  Most recent CT chest, abdomen and pelvis 04/02/2020. FINDINGS: CT CHEST FINDINGS Cardiovascular: Minimal atherosclerotic change in the thoracic aorta, no aneurysmal dilation. Heart size is normal without pericardial effusion. Central pulmonary vasculature is unremarkable on venous phase assessment. Mediastinum/Nodes: No thoracic inlet or axillary lymphadenopathy. No mediastinal lymphadenopathy. LEFT hilum with perihilar soft tissue similar to the prior study. No adenopathy in the chest. Lungs/Pleura: Post treatment changes with volume loss of bronchiectatic changes in the superior segment of the LEFT lower lobe and along the fissure in the LEFT upper lobe with similar appearance. Scattered sub solid and ground-glass  pulmonary nodules without change. For instance, on image 56 of series 4 there is an 11 x 9 mm nodule. The representative ground-glass nodule in the RIGHT chest in the superior segment of the RIGHT lower lobe measures 16 mm (image 56, series 4) Additional nodule in the RIGHT mid chest on image 78 of series 4 measures 12 x 11 mm in the RIGHT lower lobe, also stable. No new or suspicious findings in pulmonary parenchyma. No effusion. Musculoskeletal: See below for full musculoskeletal detail. No chest wall mass.  CT ABDOMEN PELVIS FINDINGS Hepatobiliary: Posterior RIGHT hepatic lobe hemangioma is not changed. No pericholecystic stranding. No new or suspicious hepatic lesion. The portal vein is patent. No biliary duct dilation. Pancreas: Pancreas normal without ductal dilation or sign of inflammation. Spleen: Spleen normal in size and contour. Adrenals/Urinary Tract: Adrenal glands are normal. Symmetric renal enhancement. No hydronephrosis. No suspicious renal lesion. Urinary bladder is normal. Stomach/Bowel: Small hiatal hernia. No acute gastrointestinal process. No pericolonic stranding. Signs of colonic diverticulosis as before. Appendix is normal. Vascular/Lymphatic: Mild calcified and noncalcified atheromatous plaque in the thoracic aorta. No aneurysmal dilation. There is no gastrohepatic or hepatoduodenal ligament lymphadenopathy. No retroperitoneal or mesenteric lymphadenopathy. No pelvic sidewall lymphadenopathy. Reproductive: Post hysterectomy. No adnexal mass. Urinary bladder is unremarkable. Other: No ascites. Musculoskeletal: No acute musculoskeletal process. No destructive bone finding. Grade 1 anterolisthesis of L4 on L5 is unchanged, disc bulging or extrusion may cause significant canal narrowing. Spinal degenerative changes. IMPRESSION: 1. Post treatment changes with volume loss of bronchiectatic changes in the superior segment of the LEFT lower lobe and along the fissure in the LEFT upper lobe with similar appearance. 2. Scattered sub solid and ground-glass pulmonary nodules without change. Continued attention on follow-up. 3. No evidence of metastatic disease in the abdomen or pelvis. 4. Grade 1 anterolisthesis of L4 on L5, disc bulging or extrusion may cause significant canal narrowing. Correlate with any radicular symptoms with further imaging with MRI as warranted. 5. Aortic atherosclerosis. Aortic Atherosclerosis (ICD10-I70.0). Electronically Signed   By: Zetta Bills M.D.   On: 08/06/2020 11:07     ASSESSMENT AND PLAN:  This is a very pleasant 74 years old white female with stage IV non-small cell lung cancer, adenocarcinoma with positive EGFR mutation with deletion in exon 19 and currently undergoing treatment with Gilotrif initially at a dose of 40 mg for 9 months followed by 30 mg by mouth daily status post 51 months.  She also underwent SBRT to the enlarging left upper lobe lung nodule. The patient continues to tolerate her treatment with Gilotrif well. Her lab work is unremarkable today. I recommended for her to continue with the same dose of Gilotrif. I will see her back for follow-up visit in 2 months for evaluation with repeat CT scan of the chest, abdomen pelvis for restaging of her disease. For the wheezing and shortness of breath, I will start the patient on Medrol Dosepak and she was advised to take her inhaler as needed. For the skin rash she will continue with the clindamycin lotion. The patient was advised to call immediately if she has any concerning symptoms in the interval. The patient voices understanding of current disease status and treatment options and is in agreement with the current care plan. All questions were answered. The patient knows to call the clinic with any problems, questions or concerns. We can certainly see the patient much sooner if necessary.  Disclaimer: This note was dictated with voice recognition software. Similar sounding words  can inadvertently be transcribed and may not be corrected upon review.

## 2020-08-09 NOTE — Progress Notes (Signed)
Spoke with Elizabeth [name] today.  She is on an oral biologic.  No barriers identified at this time, psycho-social support offered.

## 2020-08-23 MED FILL — GILOTRIF 30 MG TABLET: 30 | 30 days supply | Qty: 30 | Fill #1

## 2020-09-23 MED FILL — GILOTRIF 30 MG TABLET: 30 | 30 days supply | Qty: 30 | Fill #2

## 2020-09-29 ENCOUNTER — Other Ambulatory Visit: Payer: Self-pay | Admitting: Adult Health

## 2020-10-07 ENCOUNTER — Encounter: Payer: Self-pay | Admitting: Internal Medicine

## 2020-10-07 ENCOUNTER — Inpatient Hospital Stay: Payer: Medicare Other

## 2020-10-07 ENCOUNTER — Other Ambulatory Visit: Payer: Self-pay

## 2020-10-07 ENCOUNTER — Inpatient Hospital Stay: Payer: Medicare Other | Attending: Internal Medicine | Admitting: Internal Medicine

## 2020-10-07 VITALS — BP 153/96 | HR 87 | Temp 98.5°F | Resp 18 | Ht 59.0 in | Wt 169.6 lb

## 2020-10-07 DIAGNOSIS — E785 Hyperlipidemia, unspecified: Secondary | ICD-10-CM | POA: Diagnosis not present

## 2020-10-07 DIAGNOSIS — Z880 Allergy status to penicillin: Secondary | ICD-10-CM | POA: Diagnosis not present

## 2020-10-07 DIAGNOSIS — C3432 Malignant neoplasm of lower lobe, left bronchus or lung: Secondary | ICD-10-CM | POA: Insufficient documentation

## 2020-10-07 DIAGNOSIS — I10 Essential (primary) hypertension: Secondary | ICD-10-CM

## 2020-10-07 DIAGNOSIS — R062 Wheezing: Secondary | ICD-10-CM | POA: Diagnosis not present

## 2020-10-07 DIAGNOSIS — Z90722 Acquired absence of ovaries, bilateral: Secondary | ICD-10-CM | POA: Diagnosis not present

## 2020-10-07 DIAGNOSIS — E039 Hypothyroidism, unspecified: Secondary | ICD-10-CM | POA: Diagnosis not present

## 2020-10-07 DIAGNOSIS — Z79899 Other long term (current) drug therapy: Secondary | ICD-10-CM | POA: Insufficient documentation

## 2020-10-07 DIAGNOSIS — Z5111 Encounter for antineoplastic chemotherapy: Secondary | ICD-10-CM | POA: Diagnosis not present

## 2020-10-07 DIAGNOSIS — C3492 Malignant neoplasm of unspecified part of left bronchus or lung: Secondary | ICD-10-CM

## 2020-10-07 DIAGNOSIS — C3490 Malignant neoplasm of unspecified part of unspecified bronchus or lung: Secondary | ICD-10-CM

## 2020-10-07 DIAGNOSIS — R197 Diarrhea, unspecified: Secondary | ICD-10-CM | POA: Insufficient documentation

## 2020-10-07 LAB — CMP (CANCER CENTER ONLY)
ALT: 17 U/L (ref 0–44)
AST: 21 U/L (ref 15–41)
Albumin: 3.4 g/dL — ABNORMAL LOW (ref 3.5–5.0)
Alkaline Phosphatase: 62 U/L (ref 38–126)
Anion gap: 5 (ref 5–15)
BUN: 16 mg/dL (ref 8–23)
CO2: 28 mmol/L (ref 22–32)
Calcium: 8.7 mg/dL — ABNORMAL LOW (ref 8.9–10.3)
Chloride: 108 mmol/L (ref 98–111)
Creatinine: 1.09 mg/dL — ABNORMAL HIGH (ref 0.44–1.00)
GFR, Estimated: 54 mL/min — ABNORMAL LOW (ref >60)
Glucose, Bld: 88 mg/dL (ref 70–99)
Potassium: 4.1 mmol/L (ref 3.5–5.1)
Sodium: 141 mmol/L (ref 135–145)
Total Bilirubin: 1.1 mg/dL (ref 0.3–1.2)
Total Protein: 6.6 g/dL (ref 6.5–8.1)

## 2020-10-07 LAB — CBC WITH DIFFERENTIAL (CANCER CENTER ONLY)
Abs Immature Granulocytes: 0.02 10*3/uL (ref 0.00–0.07)
Basophils Absolute: 0 10*3/uL (ref 0.0–0.1)
Basophils Relative: 1 %
Eosinophils Absolute: 0.2 10*3/uL (ref 0.0–0.5)
Eosinophils Relative: 4 %
HCT: 36.8 % (ref 36.0–46.0)
Hemoglobin: 12.1 g/dL (ref 12.0–15.0)
Immature Granulocytes: 0 %
Lymphocytes Relative: 20 %
Lymphs Abs: 1.2 10*3/uL (ref 0.7–4.0)
MCH: 27.5 pg (ref 26.0–34.0)
MCHC: 32.9 g/dL (ref 30.0–36.0)
MCV: 83.6 fL (ref 80.0–100.0)
Monocytes Absolute: 0.6 10*3/uL (ref 0.1–1.0)
Monocytes Relative: 10 %
Neutro Abs: 3.8 10*3/uL (ref 1.7–7.7)
Neutrophils Relative %: 65 %
Platelet Count: 181 10*3/uL (ref 150–400)
RBC: 4.4 MIL/uL (ref 3.87–5.11)
RDW: 13.2 % (ref 11.5–15.5)
WBC Count: 5.8 10*3/uL (ref 4.0–10.5)
nRBC: 0 % (ref 0.0–0.2)

## 2020-10-07 NOTE — Progress Notes (Signed)
[facility] [phone]) [phone]   Fax:(336) [phone]  OFFICE PROGRESS NOTE  [name], MD [address] [place] [address]  DIAGNOSIS: Stage IV (T2a, N0, M1a) non-small cell lung cancer, adenocarcinoma with positive EGFR mutation with deletion in exon 19 diagnosed in June 2016 and presented with large mass in the left lower lobe in addition to multiple bilateral pulmonary nodules  PRIOR THERAPY:  1) Gilotrif 40 mg by mouth daily started 05/11/2015, status post 9 months of treatment discontinued on 02/17/2016 secondary to extensive skin rash. 2) SBRT to the enlarging left upper lobe nodule under the care of Dr. Lisbeth Renshaw.  CURRENT THERAPY: Gilotrif 30 mg by mouth daily started 02/21/2016 status post 55 months of treatment.  INTERVAL HISTORY: Elizabeth [name] 74 y.o. female returns to the clinic today for follow-up visit.  The patient is feeling fine today with no concerning complaints except for occasional episodes of diarrhea in the morning.  She denied having any rash.  She denied having any current chest pain, shortness of breath, cough or hemoptysis.  She has no headache or visual changes.  She has occasional wheezing especially when sleeping at night.  The patient is here today for evaluation and repeat blood work.  She continues to tolerate her treatment with Gilotrif fairly well.   MEDICAL HISTORY: Past Medical History:  Diagnosis Date  . Adenocarcinoma of left lung, stage 4 ([place]) 04/05/2015   Biopsy confirmed CT SCAN: There are innumerable bilateral pulmonary nodules. These range in size from about 5 mm to 2 cm. They demonstrate irregular indistinct borders, the larger ones demonstrating spiculation. PET SCAN: Diffuse pulmonary metastatic disease with a 4 cm dominant left lower low lung lesion. No enlarged or hypermetabolic mediastinal or hilar adenopathy.  . Allergy   . Anxiety   . Cancer ([facility])   . Change of skin related to  chemotherapy 09/25/2017  . Drug-induced skin rash 01/17/2016  . Hyperlipidemia   . Hypertension   . Insomnia   . Prediabetes   . Thyroid disease     ALLERGIES:  is allergic to penicillins.  MEDICATIONS:  Current Outpatient Medications  Medication Sig Dispense Refill  . ALPRAZolam (XANAX) 1 MG tablet TAKE ONE-HALF TO 1 TABLET BY MOUTH 2 TO 3 TIMES DAILY ONLY IF NEEDED FOR PANIC/ANXIETY, NOT TO EXCEED 5 DAYS PER WEEK TO AVOID ADDICTION/DEMENTIA 30 tablet 0  . aspirin 81 MG tablet Take 81 mg by mouth at bedtime.     . Cholecalciferol (VITAMIN D3) 10000 UNITS TABS Take 10,000 Units by mouth 3 (three) times a week.     . Cyanocobalamin (VITAMIN B 12 PO) Take 1 tablet by mouth daily.    Marland Kitchen gabapentin (NEURONTIN) 300 MG capsule Take 1 capsule 3 x /day for Pain 270 capsule 1  . GILOTRIF 30 MG tablet TAKE 1 TABLET (30MG) BY MOUTH ONCE DAILY. TAKE ON AN EMPTY STOMACH 1 HOUR BEFORE OR 2 HOURS AFTER A MEAL 30 tablet 2  . ibuprofen (ADVIL,MOTRIN) 200 MG tablet Take 200 mg by mouth every 6 (six) hours as needed.    Marland Kitchen ipratropium (ATROVENT) 0.03 % nasal spray U 2 SPRAYS IEN TID    . levothyroxine (SYNTHROID) 50 MCG tablet TAKE 1 TABLET BY MOUTH DAILY ON AN EMPTY STOMACH WITH ONLY WATER FOR 30 MINUTES. NO ACTACID, CALCIUM, OR MAGNESIUM FOR 4 HOURS. AVOID BIOTIN. 90 tablet 1  . Melatonin 10 MG CAPS Take 1 capsule by mouth at bedtime as needed.    Marland Kitchen  meloxicam (MOBIC) 15 MG tablet Take 15 mg by mouth daily.     . methylPREDNISolone (MEDROL DOSEPAK) 4 MG TBPK tablet Use as instructed. 21 tablet 0  . montelukast (SINGULAIR) 10 MG tablet TAKE 1 TABLET BY MOUTH DAILY FOR ALLERGIES 90 tablet 3  . mupirocin ointment (BACTROBAN) 2 % Place 1 application in the nose 2 x /day 22 g 3  . Promethazine-Codeine 6.25-10 MG/5ML SOLN TAKE 5 TO 10 ML BY MOUTH EVERY 4 HOURS AS NEEDED FOR COUGH OR CONGESTION 360 mL 0   No current facility-administered medications for this visit.    SURGICAL HISTORY:  Past Surgical History:   Procedure Laterality Date  . ABDOMINAL HYSTERECTOMY  1995   w BSO  . TONSILLECTOMY AND ADENOIDECTOMY      REVIEW OF SYSTEMS:  A comprehensive review of systems was negative except for: Respiratory: positive for wheezing Gastrointestinal: positive for diarrhea   PHYSICAL EXAMINATION: General appearance: alert, cooperative and no distress Head: Normocephalic, without obvious abnormality, atraumatic Neck: no adenopathy, no JVD, supple, symmetrical, trachea midline and thyroid not enlarged, symmetric, no tenderness/mass/nodules Lymph nodes: Cervical, supraclavicular, and axillary nodes normal. Resp: clear to auscultation bilaterally Back: symmetric, no curvature. ROM normal. No CVA tenderness. Cardio: regular rate and rhythm, S1, S2 normal, no murmur, click, rub or gallop GI: soft, non-tender; bowel sounds normal; no masses,  no organomegaly Extremities: extremities normal, atraumatic, no cyanosis or edema  ECOG PERFORMANCE STATUS: 1 - Symptomatic but completely ambulatory  Blood pressure (!) 153/96, pulse 87, temperature 98.5 F (36.9 C), temperature source Tympanic, resp. rate 18, height _0  (1.499 m), weight 169 lb 9.6 oz (76.9 kg), SpO2 98 %.  LABORATORY DATA: Lab Results  Component Value Date   WBC 5.8 10/07/2020   HGB 12.1 10/07/2020   HCT 36.8 10/07/2020   MCV 83.6 10/07/2020   PLT 181 10/07/2020      Chemistry      Component Value Date/Time   NA 141 10/07/2020 1042   NA 139 10/26/2017 1252   K 4.1 10/07/2020 1042   K 4.1 10/26/2017 1252   CL 108 10/07/2020 1042   CO2 28 10/07/2020 1042   CO2 28 10/26/2017 1252   BUN 16 10/07/2020 1042   BUN 15.7 10/26/2017 1252   CREATININE 1.09 (H) 10/07/2020 1042   CREATININE 1.16 (H) 04/13/2020 0000   CREATININE 1.0 10/26/2017 1252      Component Value Date/Time   CALCIUM 8.7 (L) 10/07/2020 1042   CALCIUM 8.8 10/26/2017 1252   ALKPHOS 62 10/07/2020 1042   ALKPHOS 70 10/26/2017 1252   AST 21 10/07/2020 1042   AST 19  10/26/2017 1252   ALT 17 10/07/2020 1042   ALT 22 10/26/2017 1252   BILITOT 1.1 10/07/2020 1042   BILITOT 0.90 10/26/2017 1252       RADIOGRAPHIC STUDIES: No results found.  ASSESSMENT AND PLAN:  This is a very pleasant 74 years old white female with stage IV non-small cell lung cancer, adenocarcinoma with positive EGFR mutation with deletion in exon 19 and currently undergoing treatment with Gilotrif initially at a dose of 40 mg for 9 months followed by 30 mg by mouth daily status post 55 months.  She also underwent SBRT to the enlarging left upper lobe lung nodule. She continues to tolerate her treatment with Gilotrif fairly well. I recommended for the patient to continue her current treatment with the same dose for now. I will see her back for follow-up visit in 2 months for evaluation  with repeat CT scan of the chest, abdomen pelvis for restaging of her disease. She was advised to call immediately if she has any concerning symptoms in the interval. The patient voices understanding of current disease status and treatment options and is in agreement with the current care plan. All questions were answered. The patient knows to call the clinic with any problems, questions or concerns. We can certainly see the patient much sooner if necessary.  Disclaimer: This note was dictated with voice recognition software. Similar sounding words can inadvertently be transcribed and may not be corrected upon review.

## 2020-10-14 ENCOUNTER — Other Ambulatory Visit: Payer: Self-pay | Admitting: Internal Medicine

## 2020-10-20 MED FILL — GILOTRIF 30 MG TABLET: 30 | 30 days supply | Qty: 30 | Fill #0

## 2020-10-27 ENCOUNTER — Other Ambulatory Visit: Payer: Self-pay

## 2020-10-27 MED ORDER — ALPRAZOLAM 1 MG PO TABS: ORAL_TABLET | ORAL | 0 refills | Status: DC

## 2020-11-08 ENCOUNTER — Encounter: Payer: Medicare Other | Admitting: Internal Medicine

## 2020-11-18 ENCOUNTER — Other Ambulatory Visit: Payer: Self-pay

## 2020-11-18 ENCOUNTER — Ambulatory Visit (INDEPENDENT_AMBULATORY_CARE_PROVIDER_SITE_OTHER): Payer: Medicare Other | Admitting: Internal Medicine

## 2020-11-18 VITALS — BP 116/82 | HR 78 | Temp 97.6°F | Resp 16 | Ht 60.0 in | Wt 169.4 lb

## 2020-11-18 DIAGNOSIS — Z0000 Encounter for general adult medical examination without abnormal findings: Secondary | ICD-10-CM | POA: Diagnosis not present

## 2020-11-18 DIAGNOSIS — Z87891 Personal history of nicotine dependence: Secondary | ICD-10-CM | POA: Diagnosis not present

## 2020-11-18 DIAGNOSIS — Z136 Encounter for screening for cardiovascular disorders: Secondary | ICD-10-CM

## 2020-11-18 DIAGNOSIS — Z8249 Family history of ischemic heart disease and other diseases of the circulatory system: Secondary | ICD-10-CM

## 2020-11-18 DIAGNOSIS — Z79899 Other long term (current) drug therapy: Secondary | ICD-10-CM

## 2020-11-18 DIAGNOSIS — R0989 Other specified symptoms and signs involving the circulatory and respiratory systems: Secondary | ICD-10-CM

## 2020-11-18 DIAGNOSIS — E559 Vitamin D deficiency, unspecified: Secondary | ICD-10-CM

## 2020-11-18 DIAGNOSIS — C3492 Malignant neoplasm of unspecified part of left bronchus or lung: Secondary | ICD-10-CM

## 2020-11-18 DIAGNOSIS — E039 Hypothyroidism, unspecified: Secondary | ICD-10-CM

## 2020-11-18 DIAGNOSIS — E782 Mixed hyperlipidemia: Secondary | ICD-10-CM

## 2020-11-18 DIAGNOSIS — I700 Atherosclerosis of aorta: Secondary | ICD-10-CM

## 2020-11-18 DIAGNOSIS — Z1211 Encounter for screening for malignant neoplasm of colon: Secondary | ICD-10-CM

## 2020-11-18 DIAGNOSIS — R7309 Other abnormal glucose: Secondary | ICD-10-CM

## 2020-11-18 DIAGNOSIS — Z0001 Encounter for general adult medical examination with abnormal findings: Secondary | ICD-10-CM

## 2020-11-18 MED FILL — GILOTRIF 30 MG TABLET: 30 | 30 days supply | Qty: 30 | Fill #1

## 2020-11-18 NOTE — Progress Notes (Signed)
Annual Screening/Preventative Visit & Comprehensive Evaluation &  Examination      This very nice 75 y.o.  MWF presents for a Screening /Preventative Visit & comprehensive evaluation and management of multiple medical co-morbidities.  Patient has been followed for HTN, HLD, Hypothyroidism, Prediabetes  and Vitamin D Deficiency. Aortic Atherosclerosis noted on Abd CT scan on 11/18/2019.             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        HTN predates circa 1997 . Patient's BP has been controlled at home and patient denies any cardiac symptoms as chest pain, palpitations, shortness of breath, dizziness or ankle swelling. Today's BP is at goal - 116/82.       Patient's hyperlipidemia is not controlled with diet. Last lipids were not at goal: Lab Results  Component Value Date   CHOL 187 04/13/2020   HDL 48 (L) 04/13/2020   LDLCALC 120 (H) 04/13/2020   TRIG 93 04/13/2020   CHOLHDL 3.9 04/13/2020        Patient has hx/o prediabetes (A1c 5.7% /2012)  and patient denies reactive hypoglycemic symptoms, visual blurring, diabetic polys or paresthesias. Last A1c was normal & at goal:  Lab Results  Component Value Date   HGBA1C 5.5 10/08/2019                                           Patient was dx'd Hypothyroid in 1996 & has been on thyroid replacement since then.     Finally, patient has history of Vitamin D Deficiency and last Vitamin D was at goal:  Lab Results  Component Value Date   VD25OH 3 10/08/2019    Current Outpatient Medications on File Prior to Visit  Medication Sig  . ALPRAZolam (XANAX) 1 MG tablet TAKE ONE-HALF TO 1 TAB 2 TO 3 TIMES DAILY   . aspirin 81 MG tablet Take  at bedtime.   Marland Kitchen VITAMIN D 75102 UNITS TABS Take 3 times a week.   Marland Kitchen VITAMIN B 12  Take 1 tablet daily.  Marland Kitchen gabapentin  300 MG capsule Take 1 capsule 3 x /day for Pain  . GILOTRIF 30 MG tablet TAKE 1 TABLET  DAILY.   Marland Kitchen ibuprofen  200 MG tablet Take every 6 (six) hours as needed.  . ATROVENT 0.03 % nasal spray Use 2 SPRAYS  TID  . levothyroxine50 MCG tablet TAKE 1 TABLET DAILY   . Melatonin 10 MG CAPS Take 1 capsule at bedtime as needed.  . meloxicam 15 MG tablet Take  daily.   . montelukast  10 MG tablet TAKE 1 TABLET  DAILY FOR ALLERGIES  . mupirocin ointment  2 % Place 1 application in the nose 2 x /day  . Promethazine-Codeine 6.25-10 MG/5ML SOLN TAKE 5 TO 10 ML  EVERY 4 HOURS AS NEEDED      Allergies  Allergen Reactions  . Penicillins Swelling and Rash    Past Medical History:  Diagnosis Date  . Adenocarcinoma of left lung, stage 4 ([place]) 04/05/2015   Biopsy confirmed CT SCAN: There are innumerable bilateral pulmonary nodules. These range in size from about 5 mm to 2 cm. They demonstrate irregular indistinct borders, the larger ones demonstrating spiculation. PET SCAN: Diffuse pulmonary metastatic disease with a 4 cm dominant left lower  low lung lesion. No enlarged or hypermetabolic mediastinal or hilar adenopathy.  . Allergy   . Anxiety   . Cancer ([place])   . Change of skin related to chemotherapy 09/25/2017  . Drug-induced skin rash 01/17/2016  . Hyperlipidemia   . Hypertension   . Insomnia   . Prediabetes   . Thyroid disease     Immunization History  Administered Date(s) Administered  . Influenza Split 07/16/2018  . Influenza, High Dose Seasonal PF 07/31/2014, 08/06/2017, 07/09/2019  . Influenza-Unspecified 09/13/2015, 07/23/2016, 08/06/2017, 07/23/2018  . Moderna Sars-Covid-2 Vaccination 12/05/2019, 01/03/2020  . Pneumococcal Conjugate-13 12/24/2015  . Pneumococcal Polysaccharide-23 07/22/2012  . Td 12/25/2005  . Zoster 01/09/2007    Last Colon - 11/19/2012 - Dr Deborha Payment - Recc 10 yr f/u - due Feb 2024 -   Last MGM - 04/15/2020  Past Surgical History:  Procedure Laterality Date  . ABDOMINAL HYSTERECTOMY  1995   w BSO  . TONSILLECTOMY AND ADENOIDECTOMY     Family History  Problem  Relation Age of Onset  . Liver disease Mother   . Alcohol abuse Mother   . ALS Father   . Hypertension Father   . Dementia Maternal Grandmother 44  . Lung disease Maternal Grandfather   . Lung cancer Paternal Grandmother 74  . Alcohol abuse Paternal Grandfather   . Heart disease Paternal Grandfather   . Melanoma Paternal Aunt   . Bone cancer Maternal Uncle   . Dementia Paternal Aunt 54  . Breast cancer Cousin    Social History   Tobacco Use  . Smoking status: Former Smoker    Packs/day: 0.50    Years: 10.00    Pack years: 5.00    Quit date: 10/23/1974    Years since quitting: 46.1  . Smokeless tobacco: Never Used  Vaping Use  . Vaping Use: Never used  Substance Use Topics  . Alcohol use: No    Comment: Rare  . Drug use: No    ROS Constitutional: Denies fever, chills, weight loss/gain, headaches, insomnia,  night sweats, and change in appetite. Does c/o fatigue. Eyes: Denies redness, blurred vision, diplopia, discharge, itchy, watery eyes.  ENT: Denies discharge, congestion, post nasal drip, epistaxis, sore throat, earache, hearing loss, dental pain, Tinnitus, Vertigo, Sinus pain, snoring.  Cardio: Denies chest pain, palpitations, irregular heartbeat, syncope, dyspnea, diaphoresis, orthopnea, PND, claudication, edema Respiratory: denies cough, dyspnea, DOE, pleurisy, hoarseness, laryngitis, wheezing.  Gastrointestinal: Denies dysphagia, heartburn, reflux, water brash, pain, cramps, nausea, vomiting, bloating, diarrhea, constipation, hematemesis, melena, hematochezia, jaundice, hemorrhoids Genitourinary: Denies dysuria, frequency, urgency, nocturia, hesitancy, discharge, hematuria, flank pain Breast: Breast lumps, nipple discharge, bleeding.  Musculoskeletal: Denies arthralgia, myalgia, stiffness, Jt. Swelling, pain, limp, and strain/sprain. Denies falls. Skin: Denies puritis, rash, hives, warts, acne, eczema, changing in skin lesion Neuro: No weakness, tremor, incoordination,  spasms, paresthesia, pain Psychiatric: Denies confusion, memory loss, sensory loss. Denies Depression. Endocrine: Denies change in weight, skin, hair change, nocturia, and paresthesia, diabetic polys, visual blurring, hyper / hypo glycemic episodes.  Heme/Lymph: No excessive bleeding, bruising, enlarged lymph nodes.  Physical Exam  BP 116/82   Pulse 78   Temp 97.6 F (36.4 C)   Resp 16   Ht 5' (1.524 m)   Wt 169 lb 6.4 oz (76.8 kg)   SpO2 98%   BMI 33.08 kg/m   General Appearance: Well nourished, well groomed and in no apparent distress.  Eyes: PERRLA, EOMs, conjunctiva no swelling or erythema, normal fundi and vessels. Sinuses: No frontal/maxillary tenderness ENT/Mouth:  EACs patent / TMs  nl. Nares clear without erythema, swelling, mucoid exudates. Oral hygiene is good. No erythema, swelling, or exudate. Tongue normal, non-obstructing. Tonsils not swollen or erythematous. Hearing normal.  Neck: Supple, thyroid not palpable. No bruits, nodes or JVD. Respiratory: Respiratory effort normal.  BS equal and clear bilateral without rales, rhonci, wheezing or stridor. Cardio: Heart sounds are normal with regular rate and rhythm and no murmurs, rubs or gallops. Peripheral pulses are normal and equal bilaterally without edema. No aortic or femoral bruits. Chest: symmetric with normal excursions and percussion. Breasts: Symmetric, without lumps, nipple discharge, retractions, or fibrocystic changes.  Abdomen: Flat, soft with bowel sounds active. Nontender, no guarding, rebound, hernias, masses, or organomegaly.  Lymphatics: Non tender without lymphadenopathy.  Genitourinary:  Musculoskeletal: Full ROM all peripheral extremities, joint stability, 5/5 strength, and normal gait. Skin: Warm and dry without rashes, lesions, cyanosis, clubbing or  ecchymosis.  Neuro: Cranial nerves intact, reflexes equal bilaterally. Normal muscle tone, no cerebellar symptoms. Sensation intact.  Pysch: Alert and  oriented X 3, normal affect, Insight and Judgment appropriate.   Assessment and Plan  1. Annual Preventative Screening Examination   2. Labile hypertension  - EKG 12-Lead - Urinalysis, Routine w reflex microscopic - Microalbumin / creatinine urine ratio - CBC with Differential/Platelet - COMPLETE METABOLIC PANEL WITH GFR - Magnesium - TSH  3. Hyperlipidemia, mixed  - EKG 12-Lead - Lipid panel - TSH  4. Abnormal glucose  - EKG 12-Lead - Hemoglobin A1c - Insulin, random  5. Vitamin D deficiency  - VITAMIN D 25 Hydroxyl  6. Hypothyroidism, unspecified type  - TSH  7. Aortic atherosclerosis ([place]) by Abd CT scan on 01/26/2-021  - EKG 12-Lead - Lipid panel  8. Adenocarcinoma of left lung, stage 4 ([place])   9. Screening for colorectal cancer  - POC Hemoccult Bld/Stl  10. Screening for ischemic heart disease  - EKG 12-Lead  11. FHx: heart disease  - EKG 12-Lead  12. Former smoker  - EKG 12-Lead  13. Medication management  - Urinalysis, Routine w reflex microscopic - Microalbumin / creatinine urine ratio - CBC with Differential/Platelet - COMPLETE METABOLIC PANEL WITH GFR - Magnesium - Lipid panel - TSH - Hemoglobin A1c - VITAMIN D 25 Hydroxy - Insulin, random          Patient was counseled in prudent diet to achieve/maintain BMI less than 25 for weight control, BP monitoring, regular exercise and medications. Discussed med's effects and SE's. Screening labs and tests as requested with regular follow-up as recommended. Over 40 minutes of exam, counseling, chart review and high complex critical decision making was performed.   Kirtland Bouchard, MD

## 2020-11-18 NOTE — Patient Instructions (Signed)

## 2020-11-19 ENCOUNTER — Other Ambulatory Visit: Payer: Self-pay | Admitting: Internal Medicine

## 2020-11-19 LAB — URINALYSIS, ROUTINE W REFLEX MICROSCOPIC
Bacteria, UA: NONE SEEN /HPF
Bilirubin Urine: NEGATIVE
Glucose, UA: NEGATIVE
Hgb urine dipstick: NEGATIVE
Hyaline Cast: NONE SEEN /LPF
Ketones, ur: NEGATIVE
Nitrite: NEGATIVE
Protein, ur: NEGATIVE
RBC / HPF: NONE SEEN /HPF (ref 0–2)
Specific Gravity, Urine: 1.009 (ref 1.001–1.03)
Squamous Epithelial / HPF: NONE SEEN /HPF (ref <=5)
pH: 6 (ref 5.0–8.0)

## 2020-11-19 LAB — COMPLETE METABOLIC PANEL WITH GFR
AG Ratio: 1.5 (calc) (ref 1.0–2.5)
ALT: 17 U/L (ref 6–29)
AST: 23 U/L (ref 10–35)
Albumin: 4 g/dL (ref 3.6–5.1)
Alkaline phosphatase (APISO): 65 U/L (ref 37–153)
BUN: 14 mg/dL (ref 7–25)
CO2: 28 mmol/L (ref 20–32)
Calcium: 9.1 mg/dL (ref 8.6–10.4)
Chloride: 105 mmol/L (ref 98–110)
Creat: 0.91 mg/dL (ref 0.60–0.93)
GFR, Est African American: 73 mL/min/{1.73_m2} (ref >=60)
GFR, Est Non African American: 63 mL/min/{1.73_m2} (ref >=60)
Globulin: 2.6 g/dL (calc) (ref 1.9–3.7)
Glucose, Bld: 83 mg/dL (ref 65–99)
Potassium: 4.6 mmol/L (ref 3.5–5.3)
Sodium: 142 mmol/L (ref 135–146)
Total Bilirubin: 0.8 mg/dL (ref 0.2–1.2)
Total Protein: 6.6 g/dL (ref 6.1–8.1)

## 2020-11-19 LAB — MICROALBUMIN / CREATININE URINE RATIO
Creatinine, Urine: 74 mg/dL (ref 20–275)
Microalb Creat Ratio: 5 mcg/mg creat (ref <30)
Microalb, Ur: 0.4 mg/dL

## 2020-11-19 LAB — CBC WITH DIFFERENTIAL/PLATELET
Absolute Monocytes: 522 cells/uL (ref 200–950)
Basophils Absolute: 52 cells/uL (ref 0–200)
Basophils Relative: 0.9 %
Eosinophils Absolute: 122 cells/uL (ref 15–500)
Eosinophils Relative: 2.1 %
HCT: 39.3 % (ref 35.0–45.0)
Hemoglobin: 12.7 g/dL (ref 11.7–15.5)
Lymphs Abs: 1189 cells/uL (ref 850–3900)
MCH: 27.6 pg (ref 27.0–33.0)
MCHC: 32.3 g/dL (ref 32.0–36.0)
MCV: 85.4 fL (ref 80.0–100.0)
MPV: 12.1 fL (ref 7.5–12.5)
Monocytes Relative: 9 %
Neutro Abs: 3915 cells/uL (ref 1500–7800)
Neutrophils Relative %: 67.5 %
Platelets: 212 10*3/uL (ref 140–400)
RBC: 4.6 10*6/uL (ref 3.80–5.10)
RDW: 13.1 % (ref 11.0–15.0)
Total Lymphocyte: 20.5 %
WBC: 5.8 10*3/uL (ref 3.8–10.8)

## 2020-11-19 LAB — LIPID PANEL
Cholesterol: 195 mg/dL (ref <200)
HDL: 52 mg/dL (ref >=50)
LDL Cholesterol (Calc): 124 mg/dL (calc) — ABNORMAL HIGH
Non-HDL Cholesterol (Calc): 143 mg/dL (calc) — ABNORMAL HIGH (ref <130)
Total CHOL/HDL Ratio: 3.8 (calc) (ref <5.0)
Triglycerides: 86 mg/dL (ref <150)

## 2020-11-19 LAB — INSULIN, RANDOM: Insulin: 9 u[IU]/mL

## 2020-11-19 LAB — TSH: TSH: 3.97 mIU/L (ref 0.40–4.50)

## 2020-11-19 LAB — MAGNESIUM: Magnesium: 1.9 mg/dL (ref 1.5–2.5)

## 2020-11-19 LAB — VITAMIN D 25 HYDROXY (VIT D DEFICIENCY, FRACTURES): Vit D, 25-Hydroxy: 74 ng/mL (ref 30–100)

## 2020-11-19 LAB — HEMOGLOBIN A1C
Hgb A1c MFr Bld: 5.6 % of total Hgb (ref <5.7)
Mean Plasma Glucose: 114 mg/dL
eAG (mmol/L): 6.3 mmol/L

## 2020-11-19 NOTE — Progress Notes (Signed)
========================================================== -   Test results slightly outside the reference range are not unusual. If there is anything important, I will review this with you,  otherwise it is considered normal test values.  If you have further questions,  please do not hesitate to contact me at the office or via My Chart.  ========================================================== ==========================================================  -  Total Chol = 195 - slightly elevated ( Ideal or Goal is less than 18  )   - And LDL Chol = 124 is very elevated  (Ideal or Goal is less than 70  !  )   -  Recommend a stricter  low cholesterol diet   - Cholesterol only comes from animal sources  - ie. meat, dairy, egg yolks  - Eat all the vegetables you want.  - Avoid meat, especially red meat - Beef AND Pork .  - Avoid cheese & dairy - milk & ice cream.     - Cheese is the most concentrated form of trans-fats which  is the worst thing to clog up our arteries.   - Veggie cheese is OK which can be found in the fresh  produce section at [employer] or [employer] or [employer]  ========================================================== ==========================================================  -  A1c - Normal -Great - No Diabetes  !  ========================================================== ==========================================================  -  Vitamin D = 74 - Excellent   ========================================================== ==========================================================  -  All Else - CBC - Kidneys - Electrolytes - Liver - Magnesium & Thyroid    - all  Normal / OK ===========================================================  ==========================================================

## 2020-11-20 ENCOUNTER — Encounter: Payer: Self-pay | Admitting: Internal Medicine

## 2020-12-07 ENCOUNTER — Inpatient Hospital Stay: Payer: Medicare Other | Attending: Internal Medicine

## 2020-12-07 ENCOUNTER — Ambulatory Visit (HOSPITAL_COMMUNITY)
Admission: RE | Admit: 2020-12-07 | Discharge: 2020-12-07 | Disposition: A | Discharge status: Home / Self Care | Payer: Medicare Other | Source: Ambulatory Visit | Attending: Internal Medicine | Admitting: Internal Medicine

## 2020-12-07 ENCOUNTER — Other Ambulatory Visit: Payer: Self-pay

## 2020-12-07 DIAGNOSIS — Z90722 Acquired absence of ovaries, bilateral: Secondary | ICD-10-CM | POA: Insufficient documentation

## 2020-12-07 DIAGNOSIS — Z880 Allergy status to penicillin: Secondary | ICD-10-CM | POA: Insufficient documentation

## 2020-12-07 DIAGNOSIS — C3490 Malignant neoplasm of unspecified part of unspecified bronchus or lung: Secondary | ICD-10-CM | POA: Insufficient documentation

## 2020-12-07 DIAGNOSIS — R21 Rash and other nonspecific skin eruption: Secondary | ICD-10-CM | POA: Insufficient documentation

## 2020-12-07 DIAGNOSIS — F419 Anxiety disorder, unspecified: Secondary | ICD-10-CM | POA: Diagnosis not present

## 2020-12-07 DIAGNOSIS — I700 Atherosclerosis of aorta: Secondary | ICD-10-CM | POA: Diagnosis not present

## 2020-12-07 DIAGNOSIS — E785 Hyperlipidemia, unspecified: Secondary | ICD-10-CM | POA: Diagnosis not present

## 2020-12-07 DIAGNOSIS — Z9221 Personal history of antineoplastic chemotherapy: Secondary | ICD-10-CM | POA: Insufficient documentation

## 2020-12-07 DIAGNOSIS — K5730 Diverticulosis of large intestine without perforation or abscess without bleeding: Secondary | ICD-10-CM | POA: Insufficient documentation

## 2020-12-07 DIAGNOSIS — C3432 Malignant neoplasm of lower lobe, left bronchus or lung: Secondary | ICD-10-CM | POA: Diagnosis present

## 2020-12-07 DIAGNOSIS — I10 Essential (primary) hypertension: Secondary | ICD-10-CM | POA: Insufficient documentation

## 2020-12-07 DIAGNOSIS — E079 Disorder of thyroid, unspecified: Secondary | ICD-10-CM | POA: Insufficient documentation

## 2020-12-07 DIAGNOSIS — Z79899 Other long term (current) drug therapy: Secondary | ICD-10-CM | POA: Diagnosis not present

## 2020-12-07 LAB — CMP (CANCER CENTER ONLY)
ALT: 19 U/L (ref 0–44)
AST: 28 U/L (ref 15–41)
Albumin: 3.6 g/dL (ref 3.5–5.0)
Alkaline Phosphatase: 62 U/L (ref 38–126)
Anion gap: 9 (ref 5–15)
BUN: 12 mg/dL (ref 8–23)
CO2: 27 mmol/L (ref 22–32)
Calcium: 8.9 mg/dL (ref 8.9–10.3)
Chloride: 106 mmol/L (ref 98–111)
Creatinine: 1.11 mg/dL — ABNORMAL HIGH (ref 0.44–1.00)
GFR, Estimated: 52 mL/min — ABNORMAL LOW (ref >60)
Glucose, Bld: 98 mg/dL (ref 70–99)
Potassium: 4.1 mmol/L (ref 3.5–5.1)
Sodium: 142 mmol/L (ref 135–145)
Total Bilirubin: 0.9 mg/dL (ref 0.3–1.2)
Total Protein: 6.8 g/dL (ref 6.5–8.1)

## 2020-12-07 LAB — CBC WITH DIFFERENTIAL (CANCER CENTER ONLY)
Abs Immature Granulocytes: 0.01 10*3/uL (ref 0.00–0.07)
Basophils Absolute: 0 10*3/uL (ref 0.0–0.1)
Basophils Relative: 1 %
Eosinophils Absolute: 0.2 10*3/uL (ref 0.0–0.5)
Eosinophils Relative: 3 %
HCT: 38.3 % (ref 36.0–46.0)
Hemoglobin: 12.5 g/dL (ref 12.0–15.0)
Immature Granulocytes: 0 %
Lymphocytes Relative: 24 %
Lymphs Abs: 1.3 10*3/uL (ref 0.7–4.0)
MCH: 27.7 pg (ref 26.0–34.0)
MCHC: 32.6 g/dL (ref 30.0–36.0)
MCV: 84.9 fL (ref 80.0–100.0)
Monocytes Absolute: 0.5 10*3/uL (ref 0.1–1.0)
Monocytes Relative: 9 %
Neutro Abs: 3.5 10*3/uL (ref 1.7–7.7)
Neutrophils Relative %: 63 %
Platelet Count: 179 10*3/uL (ref 150–400)
RBC: 4.51 MIL/uL (ref 3.87–5.11)
RDW: 13 % (ref 11.5–15.5)
WBC Count: 5.5 10*3/uL (ref 4.0–10.5)
nRBC: 0 % (ref 0.0–0.2)

## 2020-12-07 MED ORDER — IOHEXOL 300 MG/ML  SOLN
100.0000 mL | Freq: Once | INTRAMUSCULAR | Status: AC | PRN
  Administered 2020-12-07: 100 mL via INTRAVENOUS

## 2020-12-09 ENCOUNTER — Inpatient Hospital Stay (HOSPITAL_BASED_OUTPATIENT_CLINIC_OR_DEPARTMENT_OTHER): Payer: Medicare Other | Admitting: Internal Medicine

## 2020-12-09 ENCOUNTER — Telehealth: Payer: Self-pay | Admitting: Internal Medicine

## 2020-12-09 ENCOUNTER — Other Ambulatory Visit: Payer: Self-pay

## 2020-12-09 ENCOUNTER — Encounter: Payer: Self-pay | Admitting: *Deleted

## 2020-12-09 VITALS — BP 152/62 | HR 79 | Temp 97.6°F | Resp 15 | Ht 60.0 in | Wt 169.6 lb

## 2020-12-09 DIAGNOSIS — C3490 Malignant neoplasm of unspecified part of unspecified bronchus or lung: Secondary | ICD-10-CM

## 2020-12-09 DIAGNOSIS — Z5111 Encounter for antineoplastic chemotherapy: Secondary | ICD-10-CM | POA: Diagnosis not present

## 2020-12-09 DIAGNOSIS — C3492 Malignant neoplasm of unspecified part of left bronchus or lung: Secondary | ICD-10-CM

## 2020-12-09 DIAGNOSIS — C3432 Malignant neoplasm of lower lobe, left bronchus or lung: Secondary | ICD-10-CM | POA: Diagnosis not present

## 2020-12-09 NOTE — Progress Notes (Signed)
Spoke with patient today.  She is doing well but was nervous due to her recent scan she read on line before she saw Dr. Julien Nordmann.  He explained that he will be watching her with a follow up scan in 6 weeks.  After her visit she states she felt better and is less anxious.  I did ask her to call the cancer center if she needs [place] before 6 weeks.  She verbalized understanding.

## 2020-12-09 NOTE — Telephone Encounter (Signed)
Scheduled follow-up appointments per 2/17 los. Patient is aware.

## 2020-12-09 NOTE — Progress Notes (Signed)
[facility] [phone]   [phone]  OFFICE PROGRESS NOTE  [name], MD [address] [place] [address]  DIAGNOSIS: Stage IV (T2a, N0, M1a) non-small cell lung cancer, adenocarcinoma with positive EGFR mutation with deletion in exon 19 diagnosed in June 2016 and presented with large mass in the left lower lobe in addition to multiple bilateral pulmonary nodules  PRIOR THERAPY:  1) Gilotrif 40 mg by mouth daily started 05/11/2015, status post 9 months of treatment discontinued on 02/17/2016 secondary to extensive skin rash. 2) SBRT to the enlarging left upper lobe nodule under the care of Dr. Lisbeth Renshaw.  CURRENT THERAPY: Gilotrif 30 mg by mouth daily started 02/21/2016 status post 57 months of treatment.  INTERVAL HISTORY: Elizabeth [name] 75 y.o. female returns to the clinic today for follow-up visit.  The patient is a little bit anxious about her scan results.  She denied having any current chest pain, shortness of breath, cough or hemoptysis.  She has no nausea, vomiting, abdominal pain, diarrhea or constipation.  She has no recent weight loss or night sweats.  She has no headache or visual changes.  She has been tolerating her treatment with Gilotrif fairly well.  The patient had repeat CT scan of the chest, abdomen pelvis performed recently and she is here for evaluation and discussion of her risk her results.  MEDICAL HISTORY: Past Medical History:  Diagnosis Date  . Adenocarcinoma of left lung, stage 4 ([place]) 04/05/2015   Biopsy confirmed CT SCAN: There are innumerable bilateral pulmonary nodules. These range in size from about 5 mm to 2 cm. They demonstrate irregular indistinct borders, the larger ones demonstrating spiculation. PET SCAN: Diffuse pulmonary metastatic disease with a 4 cm dominant left lower low lung lesion. No enlarged or hypermetabolic mediastinal or hilar adenopathy.  . Allergy   . Anxiety   . Cancer  ([place])   . Change of skin related to chemotherapy 09/25/2017  . Drug-induced skin rash 01/17/2016  . Hyperlipidemia   . Hypertension   . Insomnia   . Prediabetes   . Thyroid disease     ALLERGIES:  is allergic to penicillins.  MEDICATIONS:  Current Outpatient Medications  Medication Sig Dispense Refill  . ALPRAZolam (XANAX) 1 MG tablet TAKE ONE-HALF TO 1 TABLET BY MOUTH 2 TO 3 TIMES DAILY ONLY IF NEEDED FOR PANIC/ANXIETY, NOT TO EXCEED 5 DAYS PER WEEK TO AVOID ADDICTION/DEMENTIA 45 tablet 0  . aspirin 81 MG tablet Take 81 mg by mouth at bedtime.     . Cholecalciferol (VITAMIN D3) 10000 UNITS TABS Take 10,000 Units by mouth 3 (three) times a week.     . Cyanocobalamin (VITAMIN B 12 PO) Take 1 tablet by mouth daily.    Marland Kitchen gabapentin (NEURONTIN) 300 MG capsule Take 1 capsule 3 x /day for Pain 270 capsule 1  . GILOTRIF 30 MG tablet TAKE 1 TABLET (30MG) BY MOUTH ONCE DAILY. TAKE ON AN EMPTY STOMACH 1 HOUR BEFORE OR 2 HOURS AFTER A MEAL 30 tablet 2  . ibuprofen (ADVIL,MOTRIN) 200 MG tablet Take 200 mg by mouth every 6 (six) hours as needed.    Marland Kitchen levothyroxine (SYNTHROID) 50 MCG tablet TAKE 1 TABLET BY MOUTH DAILY ON AN EMPTY STOMACH WITH ONLY WATER FOR 30 MINUTES. NO ACTACID, CALCIUM, OR MAGNESIUM FOR 4 HOURS. AVOID BIOTIN. 90 tablet 1  . Melatonin 10 MG CAPS Take 1 capsule by mouth at bedtime as needed.    Marland Kitchen  meloxicam (MOBIC) 15 MG tablet Take 15 mg by mouth daily.     . methocarbamol (ROBAXIN) 500 MG tablet TAKE 1 TABLET BY MOUTH EVERY 6 TO 8 HOURS AS NEEDED FOR DAYTIME MUSCLE SPASMS/TENSION    . montelukast (SINGULAIR) 10 MG tablet TAKE 1 TABLET BY MOUTH DAILY FOR ALLERGIES 90 tablet 3  . Promethazine-Codeine 6.25-10 MG/5ML SOLN TAKE 5 TO 10 ML BY MOUTH EVERY 4 HOURS AS NEEDED FOR COUGH OR CONGESTION 360 mL 0   No current facility-administered medications for this visit.    SURGICAL HISTORY:  Past Surgical History:  Procedure Laterality Date  . ABDOMINAL HYSTERECTOMY  1995   w BSO  .  TONSILLECTOMY AND ADENOIDECTOMY      REVIEW OF SYSTEMS:  Constitutional: negative Eyes: negative Ears, nose, mouth, throat, and face: negative Respiratory: negative Cardiovascular: negative Gastrointestinal: negative Genitourinary:negative Integument/breast: negative Hematologic/lymphatic: negative Musculoskeletal:negative Neurological: negative Behavioral/Psych: positive for anxiety Endocrine: negative Allergic/Immunologic: negative   PHYSICAL EXAMINATION: General appearance: alert, cooperative and no distress Head: Normocephalic, without obvious abnormality, atraumatic Neck: no adenopathy, no JVD, supple, symmetrical, trachea midline and thyroid not enlarged, symmetric, no tenderness/mass/nodules Lymph nodes: Cervical, supraclavicular, and axillary nodes normal. Resp: clear to auscultation bilaterally Back: symmetric, no curvature. ROM normal. No CVA tenderness. Cardio: regular rate and rhythm, S1, S2 normal, no murmur, click, rub or gallop GI: soft, non-tender; bowel sounds normal; no masses,  no organomegaly Extremities: extremities normal, atraumatic, no cyanosis or edema Neurologic: Alert and oriented X 3, normal strength and tone. Normal symmetric reflexes. Normal coordination and gait  ECOG PERFORMANCE STATUS: 1 - Symptomatic but completely ambulatory  Blood pressure (!) 152/62, pulse 79, temperature 97.6 F (36.4 C), temperature source Tympanic, resp. rate 15, height 5' (1.524 m), weight 169 lb 9.6 oz (76.9 kg), SpO2 99 %.  LABORATORY DATA: Lab Results  Component Value Date   WBC 5.5 12/07/2020   HGB 12.5 12/07/2020   HCT 38.3 12/07/2020   MCV 84.9 12/07/2020   PLT 179 12/07/2020      Chemistry      Component Value Date/Time   NA 142 12/07/2020 0841   NA 139 10/26/2017 1252   K 4.1 12/07/2020 0841   K 4.1 10/26/2017 1252   CL 106 12/07/2020 0841   CO2 27 12/07/2020 0841   CO2 28 10/26/2017 1252   BUN 12 12/07/2020 0841   BUN 15.7 10/26/2017 1252    CREATININE 1.11 (H) 12/07/2020 0841   CREATININE 0.91 11/18/2020 1057   CREATININE 1.0 10/26/2017 1252      Component Value Date/Time   CALCIUM 8.9 12/07/2020 0841   CALCIUM 8.8 10/26/2017 1252   ALKPHOS 62 12/07/2020 0841   ALKPHOS 70 10/26/2017 1252   AST 28 12/07/2020 0841   AST 19 10/26/2017 1252   ALT 19 12/07/2020 0841   ALT 22 10/26/2017 1252   BILITOT 0.9 12/07/2020 0841   BILITOT 0.90 10/26/2017 1252       RADIOGRAPHIC STUDIES: CT Chest W Contrast  Result Date: 12/07/2020 CLINICAL DATA:  Primary Cancer Type: Lung Imaging Indication: Routine surveillance Interval therapy since last imaging? Yes Initial Cancer Diagnosis Date: 04/01/2015; Established by: Biopsy-proven Detailed Pathology: Stage IV non-small cell lung cancer, adenocarcinoma. Primary Tumor location:  Left lower lobe. Recurrence? Yes; Date(s) of recurrence: 07/18/2019; Established by: Imaging only; Left upper lobe. Surgeries: Hysterectomy. Chemotherapy: Yes; Ongoing?  Yes; Gilotrif, daily since 2016. Immunotherapy? No Radiation therapy? Yes; Date Range: 12/11/2019 - 12/18/2019; Target: Left lung. EXAM: CT CHEST, ABDOMEN, AND PELVIS WITH  CONTRAST TECHNIQUE: Multidetector CT imaging of the chest, abdomen and pelvis was performed following the standard protocol during bolus administration of intravenous contrast. CONTRAST:  165m OMNIPAQUE IOHEXOL 300 MG/ML SOLN, additional oral enteric contrast COMPARISON:  Most recent CT chest, abdomen and pelvis 08/06/2020. 03/25/2015 PET-CT. FINDINGS: CT CHEST FINDINGS Cardiovascular: Scattered aortic atherosclerosis. Normal heart size. No pericardial effusion. Mediastinum/Nodes: No enlarged mediastinal, hilar, or axillary lymph nodes. Thyroid gland, trachea, and esophagus demonstrate no significant findings. Lungs/Pleura: Unchanged post treatment appearance of the infrahilar left lung with dense fibrosis and consolidation (series 4, image 78). Unchanged background of numerous irregular  pulmonary nodules and opacities, consistent with treated metastases, the largest in the peripheral left upper lobe measuring 1.3 x 0.8 cm (series 4, image 48). No pleural effusion or pneumothorax. Musculoskeletal: No chest wall mass or suspicious bone lesions identified. CT ABDOMEN PELVIS FINDINGS Hepatobiliary: No solid liver abnormality is seen. Benign hemangioma of the posterior liver dome, unchanged (series 2, image 45). No gallstones, gallbladder wall thickening, or biliary dilatation. Pancreas: Unremarkable. No pancreatic ductal dilatation or surrounding inflammatory changes. Spleen: Normal in size without significant abnormality. Adrenals/Urinary Tract: Adrenal glands are unremarkable. Kidneys are normal, without renal calculi, solid lesion, or hydronephrosis. Bladder is unremarkable. Stomach/Bowel: Stomach is within normal limits. Appendix appears normal. No evidence of bowel wall thickening, distention, or inflammatory changes. Sigmoid diverticulosis. Vascular/Lymphatic: Aortic atherosclerosis. No enlarged abdominal or pelvic lymph nodes. Reproductive: Status post hysterectomy. Other: No abdominal wall hernia or abnormality. No abdominopelvic ascites. Significant interval enlargement of a nodule in the left retroperitoneal fat overlying the iliopsoas muscle measuring 0.8 cm, previously 0.3 cm (series 2, image 82). Musculoskeletal: No acute or significant osseous findings. Redemonstrated chronic anterolisthesis of L4 on L5 with associated disc degenerative disease. IMPRESSION: 1. Unchanged post treatment appearance of infrahilar left lung mass with dense fibrosis and consolidation. 2. Unchanged background of numerous irregular pulmonary nodules and opacities, consistent with treated metastases, the largest in the peripheral left upper lobe measuring 1.3 x 0.8 cm. 3. Significant interval enlargement of a nodule in the left retroperitoneal fat overlying the iliopsoas muscle measuring 0.8 cm, previously 0.3 cm.  This is suspicious for soft tissue metastatic disease and given size and location could likely be characterized for FDG avidity by FDG PET. 4. No other evidence of new metastatic disease in the chest, abdomen, or pelvis. Aortic Atherosclerosis (ICD10-I70.0). Electronically Signed   By: AEddie CandleM.D.   On: 12/07/2020 11:06   CT Abdomen Pelvis W Contrast  Result Date: 12/07/2020 CLINICAL DATA:  Primary Cancer Type: Lung Imaging Indication: Routine surveillance Interval therapy since last imaging? Yes Initial Cancer Diagnosis Date: 04/01/2015; Established by: Biopsy-proven Detailed Pathology: Stage IV non-small cell lung cancer, adenocarcinoma. Primary Tumor location:  Left lower lobe. Recurrence? Yes; Date(s) of recurrence: 07/18/2019; Established by: Imaging only; Left upper lobe. Surgeries: Hysterectomy. Chemotherapy: Yes; Ongoing?  Yes; Gilotrif, daily since 2016. Immunotherapy? No Radiation therapy? Yes; Date Range: 12/11/2019 - 12/18/2019; Target: Left lung. EXAM: CT CHEST, ABDOMEN, AND PELVIS WITH CONTRAST TECHNIQUE: Multidetector CT imaging of the chest, abdomen and pelvis was performed following the standard protocol during bolus administration of intravenous contrast. CONTRAST:  1046mOMNIPAQUE IOHEXOL 300 MG/ML SOLN, additional oral enteric contrast COMPARISON:  Most recent CT chest, abdomen and pelvis 08/06/2020. 03/25/2015 PET-CT. FINDINGS: CT CHEST FINDINGS Cardiovascular: Scattered aortic atherosclerosis. Normal heart size. No pericardial effusion. Mediastinum/Nodes: No enlarged mediastinal, hilar, or axillary lymph nodes. Thyroid gland, trachea, and esophagus demonstrate no significant findings. Lungs/Pleura: Unchanged post treatment appearance  of the infrahilar left lung with dense fibrosis and consolidation (series 4, image 78). Unchanged background of numerous irregular pulmonary nodules and opacities, consistent with treated metastases, the largest in the peripheral left upper lobe measuring  1.3 x 0.8 cm (series 4, image 48). No pleural effusion or pneumothorax. Musculoskeletal: No chest wall mass or suspicious bone lesions identified. CT ABDOMEN PELVIS FINDINGS Hepatobiliary: No solid liver abnormality is seen. Benign hemangioma of the posterior liver dome, unchanged (series 2, image 45). No gallstones, gallbladder wall thickening, or biliary dilatation. Pancreas: Unremarkable. No pancreatic ductal dilatation or surrounding inflammatory changes. Spleen: Normal in size without significant abnormality. Adrenals/Urinary Tract: Adrenal glands are unremarkable. Kidneys are normal, without renal calculi, solid lesion, or hydronephrosis. Bladder is unremarkable. Stomach/Bowel: Stomach is within normal limits. Appendix appears normal. No evidence of bowel wall thickening, distention, or inflammatory changes. Sigmoid diverticulosis. Vascular/Lymphatic: Aortic atherosclerosis. No enlarged abdominal or pelvic lymph nodes. Reproductive: Status post hysterectomy. Other: No abdominal wall hernia or abnormality. No abdominopelvic ascites. Significant interval enlargement of a nodule in the left retroperitoneal fat overlying the iliopsoas muscle measuring 0.8 cm, previously 0.3 cm (series 2, image 82). Musculoskeletal: No acute or significant osseous findings. Redemonstrated chronic anterolisthesis of L4 on L5 with associated disc degenerative disease. IMPRESSION: 1. Unchanged post treatment appearance of infrahilar left lung mass with dense fibrosis and consolidation. 2. Unchanged background of numerous irregular pulmonary nodules and opacities, consistent with treated metastases, the largest in the peripheral left upper lobe measuring 1.3 x 0.8 cm. 3. Significant interval enlargement of a nodule in the left retroperitoneal fat overlying the iliopsoas muscle measuring 0.8 cm, previously 0.3 cm. This is suspicious for soft tissue metastatic disease and given size and location could likely be characterized for FDG  avidity by FDG PET. 4. No other evidence of new metastatic disease in the chest, abdomen, or pelvis. Aortic Atherosclerosis (ICD10-I70.0). Electronically Signed   By: Eddie Candle M.D.   On: 12/07/2020 11:06    ASSESSMENT AND PLAN:  This is a very pleasant 75 years old white female with stage IV non-small cell lung cancer, adenocarcinoma with positive EGFR mutation with deletion in exon 19 and currently undergoing treatment with Gilotrif initially at a dose of 40 mg for 9 months followed by 30 mg by mouth daily status post 57 months.  She also underwent SBRT to the enlarging left upper lobe lung nodule. The patient continues to tolerate her treatment with Gilotrif fairly well with no concerning adverse effects.  She has no significant rash or diarrhea. She had repeat CT scan of the chest, abdomen pelvis performed recently.  I personally and independently reviewed the scans and discussed the results with the patient today. Her scan showed no concerning findings for disease progression in the chest but there was a suspicious soft tissue nodule in the left retroperitoneal fat overlying the iliopsoas muscle measuring 0.8 cm. I showed the images to the patient and this nodule is present only in one cut of the scan.  It is still suspicious for metastatic lesion but other etiology could not be excluded. I discussed with the patient several options for management of her condition including continuous observation and monitoring versus proceeding with a PET scan for further evaluation of this lesion. The patient is comfortable with the continuous observation of this nodule at this point. I will see her back for follow-up visit in 6 weeks for evaluation with repeat CT scan of the abdomen pelvis and if this nodule continues to increase in  size or there is any other lesion, we will consider her for a PET scan and biopsy. The patient will continue her current treatment with Gilotrif. She was advised to call  immediately if she notices any other significant change in the interval. The patient voices understanding of current disease status and treatment options and is in agreement with the current care plan. All questions were answered. The patient knows to call the clinic with any problems, questions or concerns. We can certainly see the patient much sooner if necessary.  Disclaimer: This note was dictated with voice recognition software. Similar sounding words can inadvertently be transcribed and may not be corrected upon review.

## 2020-12-23 MED FILL — GILOTRIF 30 MG TABLET: 30 | 30 days supply | Qty: 30 | Fill #2

## 2020-12-30 ENCOUNTER — Other Ambulatory Visit: Payer: Self-pay | Admitting: Adult Health

## 2020-12-30 DIAGNOSIS — E039 Hypothyroidism, unspecified: Secondary | ICD-10-CM

## 2021-01-03 ENCOUNTER — Telehealth: Payer: Self-pay | Admitting: Internal Medicine

## 2021-01-03 NOTE — Telephone Encounter (Signed)
R/s 3/31 per provider PAL. Called and spoke with patient. Confirmed appt

## 2021-01-14 ENCOUNTER — Other Ambulatory Visit: Payer: Self-pay | Admitting: Internal Medicine

## 2021-01-18 ENCOUNTER — Other Ambulatory Visit: Payer: Medicare Other

## 2021-01-20 ENCOUNTER — Ambulatory Visit (HOSPITAL_COMMUNITY)
Admission: RE | Admit: 2021-01-20 | Discharge: 2021-01-20 | Disposition: A | Discharge status: Home / Self Care | Payer: Medicare Other | Source: Ambulatory Visit | Attending: Internal Medicine | Admitting: Internal Medicine

## 2021-01-20 ENCOUNTER — Encounter (HOSPITAL_COMMUNITY): Payer: Self-pay

## 2021-01-20 ENCOUNTER — Inpatient Hospital Stay: Payer: Medicare Other | Attending: Internal Medicine

## 2021-01-20 ENCOUNTER — Other Ambulatory Visit: Payer: Self-pay

## 2021-01-20 ENCOUNTER — Ambulatory Visit: Payer: Medicare Other | Admitting: Internal Medicine

## 2021-01-20 DIAGNOSIS — C3490 Malignant neoplasm of unspecified part of unspecified bronchus or lung: Secondary | ICD-10-CM | POA: Diagnosis not present

## 2021-01-20 DIAGNOSIS — C3432 Malignant neoplasm of lower lobe, left bronchus or lung: Secondary | ICD-10-CM | POA: Diagnosis present

## 2021-01-20 LAB — CMP (CANCER CENTER ONLY)
ALT: 17 U/L (ref 0–44)
AST: 25 U/L (ref 15–41)
Albumin: 3.6 g/dL (ref 3.5–5.0)
Alkaline Phosphatase: 62 U/L (ref 38–126)
Anion gap: 10 (ref 5–15)
BUN: 15 mg/dL (ref 8–23)
CO2: 26 mmol/L (ref 22–32)
Calcium: 8.5 mg/dL — ABNORMAL LOW (ref 8.9–10.3)
Chloride: 106 mmol/L (ref 98–111)
Creatinine: 0.94 mg/dL (ref 0.44–1.00)
GFR, Estimated: >60 mL/min (ref >60)
Glucose, Bld: 89 mg/dL (ref 70–99)
Potassium: 4.2 mmol/L (ref 3.5–5.1)
Sodium: 142 mmol/L (ref 135–145)
Total Bilirubin: 1 mg/dL (ref 0.3–1.2)
Total Protein: 6.8 g/dL (ref 6.5–8.1)

## 2021-01-20 LAB — CBC WITH DIFFERENTIAL (CANCER CENTER ONLY)
Abs Immature Granulocytes: 0.02 10*3/uL (ref 0.00–0.07)
Basophils Absolute: 0.1 10*3/uL (ref 0.0–0.1)
Basophils Relative: 1 %
Eosinophils Absolute: 0.2 10*3/uL (ref 0.0–0.5)
Eosinophils Relative: 3 %
HCT: 39.1 % (ref 36.0–46.0)
Hemoglobin: 12.6 g/dL (ref 12.0–15.0)
Immature Granulocytes: 0 %
Lymphocytes Relative: 22 %
Lymphs Abs: 1.3 10*3/uL (ref 0.7–4.0)
MCH: 27.6 pg (ref 26.0–34.0)
MCHC: 32.2 g/dL (ref 30.0–36.0)
MCV: 85.7 fL (ref 80.0–100.0)
Monocytes Absolute: 0.6 10*3/uL (ref 0.1–1.0)
Monocytes Relative: 10 %
Neutro Abs: 3.8 10*3/uL (ref 1.7–7.7)
Neutrophils Relative %: 64 %
Platelet Count: 194 10*3/uL (ref 150–400)
RBC: 4.56 MIL/uL (ref 3.87–5.11)
RDW: 13 % (ref 11.5–15.5)
WBC Count: 6 10*3/uL (ref 4.0–10.5)
nRBC: 0 % (ref 0.0–0.2)

## 2021-01-20 LAB — CEA (IN HOUSE-CHCC): CEA (CHCC-In House): 1.47 ng/mL (ref 0.00–5.00)

## 2021-01-20 MED ORDER — IOHEXOL 300 MG/ML  SOLN
100.0000 mL | Freq: Once | INTRAMUSCULAR | Status: AC | PRN
  Administered 2021-01-20: 100 mL via INTRAVENOUS

## 2021-01-20 MED FILL — GILOTRIF 30 MG TABLET: 30 | 30 days supply | Qty: 30 | Fill #0

## 2021-01-21 ENCOUNTER — Other Ambulatory Visit (HOSPITAL_COMMUNITY): Payer: Self-pay

## 2021-01-24 ENCOUNTER — Other Ambulatory Visit: Payer: Self-pay

## 2021-01-24 ENCOUNTER — Inpatient Hospital Stay: Payer: Medicare Other | Attending: Internal Medicine | Admitting: Internal Medicine

## 2021-01-24 VITALS — BP 152/98 | HR 97 | Temp 97.2°F | Resp 16 | Ht 60.0 in | Wt 170.3 lb

## 2021-01-24 DIAGNOSIS — I10 Essential (primary) hypertension: Secondary | ICD-10-CM | POA: Diagnosis not present

## 2021-01-24 DIAGNOSIS — Z880 Allergy status to penicillin: Secondary | ICD-10-CM | POA: Insufficient documentation

## 2021-01-24 DIAGNOSIS — E785 Hyperlipidemia, unspecified: Secondary | ICD-10-CM | POA: Diagnosis not present

## 2021-01-24 DIAGNOSIS — Z5111 Encounter for antineoplastic chemotherapy: Secondary | ICD-10-CM

## 2021-01-24 DIAGNOSIS — K5730 Diverticulosis of large intestine without perforation or abscess without bleeding: Secondary | ICD-10-CM | POA: Insufficient documentation

## 2021-01-24 DIAGNOSIS — R0602 Shortness of breath: Secondary | ICD-10-CM | POA: Diagnosis not present

## 2021-01-24 DIAGNOSIS — C3492 Malignant neoplasm of unspecified part of left bronchus or lung: Secondary | ICD-10-CM | POA: Diagnosis not present

## 2021-01-24 DIAGNOSIS — C3490 Malignant neoplasm of unspecified part of unspecified bronchus or lung: Secondary | ICD-10-CM | POA: Diagnosis not present

## 2021-01-24 DIAGNOSIS — R0609 Other forms of dyspnea: Secondary | ICD-10-CM | POA: Diagnosis not present

## 2021-01-24 DIAGNOSIS — Z90722 Acquired absence of ovaries, bilateral: Secondary | ICD-10-CM | POA: Diagnosis not present

## 2021-01-24 DIAGNOSIS — C3432 Malignant neoplasm of lower lobe, left bronchus or lung: Secondary | ICD-10-CM | POA: Insufficient documentation

## 2021-01-24 DIAGNOSIS — Z79899 Other long term (current) drug therapy: Secondary | ICD-10-CM | POA: Diagnosis not present

## 2021-01-24 DIAGNOSIS — I700 Atherosclerosis of aorta: Secondary | ICD-10-CM | POA: Diagnosis not present

## 2021-01-24 NOTE — Progress Notes (Signed)
[facility] [phone]   [phone]  OFFICE PROGRESS NOTE  [name], MD [address] [place] [address]  DIAGNOSIS: Stage IV (T2a, N0, M1a) non-small cell lung cancer, adenocarcinoma with positive EGFR mutation with deletion in exon 19 diagnosed in June 2016 and presented with large mass in the left lower lobe in addition to multiple bilateral pulmonary nodules  PRIOR THERAPY:  1) Gilotrif 40 mg by mouth daily started 05/11/2015, status post 9 months of treatment discontinued on 02/17/2016 secondary to extensive skin rash. 2) SBRT to the enlarging left upper lobe nodule under the care of Dr. Lisbeth Renshaw.  CURRENT THERAPY: Gilotrif 30 mg by mouth daily started 02/21/2016 status post 59 months of treatment.  INTERVAL HISTORY: Elizabeth [name] 75 y.o. female returns to the clinic today for follow-up visit accompanied by her son.  The patient is feeling fine today with no concerning complaints except for shortness of breath with exertion.  She denied having any current chest pain, cough or hemoptysis.  She denied having any fever or chills.  She has no nausea, vomiting, diarrhea or constipation.  She denied having any abdominal pain.  She continues to tolerate her treatment with Gilotrif fairly well.  She had repeat CT of the abdomen pelvis performed recently and she is here for evaluation and discussion of her scan results.  MEDICAL HISTORY: Past Medical History:  Diagnosis Date  . Adenocarcinoma of left lung, stage 4 ([place]) 04/05/2015   Biopsy confirmed CT SCAN: There are innumerable bilateral pulmonary nodules. These range in size from about 5 mm to 2 cm. They demonstrate irregular indistinct borders, the larger ones demonstrating spiculation. PET SCAN: Diffuse pulmonary metastatic disease with a 4 cm dominant left lower low lung lesion. No enlarged or hypermetabolic mediastinal or hilar adenopathy.  . Allergy   . Anxiety   .  Cancer ([place])   . Change of skin related to chemotherapy 09/25/2017  . Drug-induced skin rash 01/17/2016  . Hyperlipidemia   . Hypertension   . Insomnia   . Prediabetes   . Thyroid disease     ALLERGIES:  is allergic to penicillins.  MEDICATIONS:  Current Outpatient Medications  Medication Sig Dispense Refill  . afatinib dimaleate (GILOTRIF) 30 MG tablet TAKE 1 TABLET (30MG) BY MOUTH ONCE DAILY. TAKE ON AN EMPTY STOMACH 1 HOUR BEFORE OR 2 HOURS AFTER A MEAL 30 tablet 2  . ALPRAZolam (XANAX) 1 MG tablet TAKE ONE-HALF TO 1 TABLET BY MOUTH 2 TO 3 TIMES DAILY ONLY IF NEEDED FOR PANIC/ANXIETY, NOT TO EXCEED 5 DAYS PER WEEK TO AVOID ADDICTION/DEMENTIA 45 tablet 0  . aspirin 81 MG tablet Take 81 mg by mouth at bedtime.     . Cholecalciferol (VITAMIN D3) 10000 UNITS TABS Take 10,000 Units by mouth 3 (three) times a week.     . Cyanocobalamin (VITAMIN B 12 PO) Take 1 tablet by mouth daily.    Marland Kitchen ibuprofen (ADVIL,MOTRIN) 200 MG tablet Take 200 mg by mouth every 6 (six) hours as needed.    Marland Kitchen levothyroxine (SYNTHROID) 50 MCG tablet TAKE 1 TABLET BY MOUTH DAILY ON AN EMPTY STOMACH WITH ONLY WATER FOR 30 MINUTES. NO ANTACID, CALCIUM, OR MAGNESIUM FOR 4 HOURS. AVOID BIOTIN 90 tablet 3  . Melatonin 10 MG CAPS Take 1 capsule by mouth at bedtime as needed.    . meloxicam (MOBIC) 15 MG tablet Take 15 mg by mouth daily.     . methocarbamol (ROBAXIN)  500 MG tablet TAKE 1 TABLET BY MOUTH EVERY 6 TO 8 HOURS AS NEEDED FOR DAYTIME MUSCLE SPASMS/TENSION    . montelukast (SINGULAIR) 10 MG tablet TAKE 1 TABLET BY MOUTH DAILY FOR ALLERGIES 90 tablet 3  . Promethazine-Codeine 6.25-10 MG/5ML SOLN TAKE 5 TO 10 ML BY MOUTH EVERY 4 HOURS AS NEEDED FOR COUGH OR CONGESTION (Patient not taking: Reported on 12/09/2020) 360 mL 0   No current facility-administered medications for this visit.    SURGICAL HISTORY:  Past Surgical History:  Procedure Laterality Date  . ABDOMINAL HYSTERECTOMY  1995   w BSO  . TONSILLECTOMY AND  ADENOIDECTOMY      REVIEW OF SYSTEMS:  Constitutional: negative Eyes: negative Ears, nose, mouth, throat, and face: negative Respiratory: positive for dyspnea on exertion Cardiovascular: negative Gastrointestinal: negative Genitourinary:negative Integument/breast: negative Hematologic/lymphatic: negative Musculoskeletal:negative Neurological: negative Behavioral/Psych: negative Endocrine: negative Allergic/Immunologic: negative   PHYSICAL EXAMINATION: General appearance: alert, cooperative and no distress Head: Normocephalic, without obvious abnormality, atraumatic Neck: no adenopathy, no JVD, supple, symmetrical, trachea midline and thyroid not enlarged, symmetric, no tenderness/mass/nodules Lymph nodes: Cervical, supraclavicular, and axillary nodes normal. Resp: clear to auscultation bilaterally Back: symmetric, no curvature. ROM normal. No CVA tenderness. Cardio: regular rate and rhythm, S1, S2 normal, no murmur, click, rub or gallop GI: soft, non-tender; bowel sounds normal; no masses,  no organomegaly Extremities: extremities normal, atraumatic, no cyanosis or edema Neurologic: Alert and oriented X 3, normal strength and tone. Normal symmetric reflexes. Normal coordination and gait  ECOG PERFORMANCE STATUS: 1 - Symptomatic but completely ambulatory  Blood pressure (!) 152/98, pulse 97, temperature (!) 97.2 F (36.2 C), temperature source Tympanic, resp. rate 16, height 5' (1.524 m), weight 170 lb 4.8 oz (77.2 kg), SpO2 98 %.  LABORATORY DATA: Lab Results  Component Value Date   WBC 6.0 01/20/2021   HGB 12.6 01/20/2021   HCT 39.1 01/20/2021   MCV 85.7 01/20/2021   PLT 194 01/20/2021      Chemistry      Component Value Date/Time   NA 142 01/20/2021 0913   NA 139 10/26/2017 1252   K 4.2 01/20/2021 0913   K 4.1 10/26/2017 1252   CL 106 01/20/2021 0913   CO2 26 01/20/2021 0913   CO2 28 10/26/2017 1252   BUN 15 01/20/2021 0913   BUN 15.7 10/26/2017 1252    CREATININE 0.94 01/20/2021 0913   CREATININE 0.91 11/18/2020 1057   CREATININE 1.0 10/26/2017 1252      Component Value Date/Time   CALCIUM 8.5 (L) 01/20/2021 0913   CALCIUM 8.8 10/26/2017 1252   ALKPHOS 62 01/20/2021 0913   ALKPHOS 70 10/26/2017 1252   AST 25 01/20/2021 0913   AST 19 10/26/2017 1252   ALT 17 01/20/2021 0913   ALT 22 10/26/2017 1252   BILITOT 1.0 01/20/2021 0913   BILITOT 0.90 10/26/2017 1252       RADIOGRAPHIC STUDIES: CT Abdomen Pelvis W Contrast  Result Date: 01/21/2021 CLINICAL DATA:  75 year old female with history of non-small cell lung cancer. Staging examination. EXAM: CT ABDOMEN AND PELVIS WITH CONTRAST TECHNIQUE: Multidetector CT imaging of the abdomen and pelvis was performed using the standard protocol following bolus administration of intravenous contrast. CONTRAST:  181m OMNIPAQUE IOHEXOL 300 MG/ML  SOLN COMPARISON:  CT the chest, abdomen and pelvis 12/07/2020. FINDINGS: Lower chest: Chronic mass-like area of architectural distortion in the inferior aspect of the left lung, incompletely imaged, but grossly similar to recent chest CT 12/07/2020, corresponding to the treated lung mass.  Hepatobiliary: 3.5 x 3.1 cm centrally low-attenuation lesion with peripheral nodular early phase hyperenhancement and progressive centripetal filling in segment 7 of the liver (axial image 18 of series 2), similar to prior studies, compatible with a benign cavernous hemangioma. No other suspicious hepatic lesions are noted. No intra or extrahepatic biliary ductal dilatation. Gallbladder is normal in appearance. Pancreas: No pancreatic mass. No pancreatic ductal dilatation. No pancreatic or peripancreatic fluid collections or inflammatory changes. Spleen: Calcified granuloma in the spleen. Adrenals/Urinary Tract: Bilateral kidneys and adrenal glands are normal in appearance. No hydroureteronephrosis. Urinary bladder is normal in appearance. Stomach/Bowel: Normal appearance of the  stomach. No pathologic dilatation of small bowel or colon. A few scattered colonic diverticulae are noted, most evident in the descending colon and sigmoid colon, without surrounding inflammatory changes to suggest an acute diverticulitis at this time. Normal appendix. Vascular/Lymphatic: Aortic atherosclerosis, without evidence of aneurysm or dissection in the abdominal or pelvic vasculature. No lymphadenopathy noted in the abdomen or pelvis. Reproductive: Status post hysterectomy. Ovaries are not confidently identified may be surgically absent or atrophic. Other: Small soft tissue attenuation nodule in the left retroperitoneal fat immediately anterior to the left iliacus muscle (axial image 50 of series 2) currently measuring 9 x 7 mm (previously 8 x 6 mm on 12/07/2020). No significant volume of ascites. No pneumoperitoneum. Musculoskeletal: 8 mm sclerotic lesion with narrow zone of transition in the left symphysis pubis, similar to prior studies, most compatible with a benign bone island. There are no other aggressive appearing lytic or blastic lesions noted in the visualized portions of the skeleton. IMPRESSION: 1. Small soft tissue attenuation nodule in the retroperitoneal fat immediately anterior to the left iliacus muscle (axial image 50 of series 2) minimally increased in size compared to the recent prior study, currently measuring 9 x 7 mm. This remains concerning for potential soft tissue metastasis. Close attention on follow-up studies is recommended. 2. No other new sites of metastatic disease are noted elsewhere in the abdomen or pelvis. 3. 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 4. Colonic diverticulosis without evidence of acute diverticulitis at this time. 5. Aortic atherosclerosis. Electronically Signed   By: Vinnie Langton M.D.   On: 01/21/2021 06:54    ASSESSMENT AND PLAN:  This is a very pleasant 75 years old white female with stage IV non-small cell  lung cancer, adenocarcinoma with positive EGFR mutation with deletion in exon 19 and currently undergoing treatment with Gilotrif initially at a dose of 40 mg for 9 months followed by 30 mg by mouth daily status post 59 months.  She also underwent SBRT to the enlarging left upper lobe lung nodule. The patient has been tolerating this treatment fairly well with no concerning adverse effect except for mild skin rash and occasional diarrhea. She had repeat CT scan of the abdomen pelvis for evaluation of the soft tissue nodule close to the iliopsoas muscle.  I personally and independently reviewed the scan images and discussed the result and showed the images to the patient today. The scan showed very minimal change and that soft tissue nodule is still concerning for metastatic disease but other etiologies could not be excluded. I discussed with the patient the option for management of this condition and in the absence of any significant change over the last 6 weeks, she will continue on her current treatment with Gilotrif and I will continue to monitor this nodule closely on upcoming imaging studies. I will arrange for the patient  to come back for follow-up visit in 2 months with repeat CT scan of the chest, abdomen pelvis for disease. For hypertension, the patient was advised to take her blood pressure medication as prescribed and to monitor it closely at home. The patient was advised to call immediately if she has any other concerning symptoms in the interval. The patient voices understanding of current disease status and treatment options and is in agreement with the current care plan. All questions were answered. The patient knows to call the clinic with any problems, questions or concerns. We can certainly see the patient much sooner if necessary.  Disclaimer: This note was dictated with voice recognition software. Similar sounding words can inadvertently be transcribed and may not be corrected upon  review.

## 2021-01-26 ENCOUNTER — Telehealth: Payer: Self-pay | Admitting: Internal Medicine

## 2021-01-26 NOTE — Telephone Encounter (Signed)
Scheduled per los. Called, not able to leave msg. Mailed printout  

## 2021-02-08 ENCOUNTER — Telehealth: Payer: Self-pay | Admitting: Internal Medicine

## 2021-02-08 NOTE — Telephone Encounter (Signed)
R/s appts per 4/19 sch msg. Pt aware.

## 2021-02-18 ENCOUNTER — Other Ambulatory Visit (HOSPITAL_COMMUNITY): Payer: Self-pay

## 2021-02-18 MED FILL — Afatinib Dimaleate Tab 30 MG (Base Equivalent): ORAL | 30 days supply | Qty: 30 | Fill #0 | Status: AC

## 2021-02-22 ENCOUNTER — Other Ambulatory Visit (HOSPITAL_COMMUNITY): Payer: Self-pay

## 2021-03-18 ENCOUNTER — Other Ambulatory Visit (HOSPITAL_COMMUNITY): Payer: Self-pay

## 2021-03-18 MED FILL — Afatinib Dimaleate Tab 30 MG (Base Equivalent): ORAL | 30 days supply | Qty: 30 | Fill #1 | Status: AC

## 2021-03-23 ENCOUNTER — Other Ambulatory Visit (HOSPITAL_COMMUNITY): Payer: Self-pay

## 2021-03-28 ENCOUNTER — Other Ambulatory Visit: Payer: Medicare Other

## 2021-03-30 ENCOUNTER — Ambulatory Visit: Payer: Medicare Other | Admitting: Internal Medicine

## 2021-04-04 ENCOUNTER — Inpatient Hospital Stay: Payer: Medicare Other | Attending: Internal Medicine

## 2021-04-04 ENCOUNTER — Ambulatory Visit (HOSPITAL_COMMUNITY)
Admission: RE | Admit: 2021-04-04 | Discharge: 2021-04-04 | Disposition: A | Discharge status: Home / Self Care | Payer: Medicare Other | Source: Ambulatory Visit | Attending: Internal Medicine | Admitting: Internal Medicine

## 2021-04-04 ENCOUNTER — Other Ambulatory Visit: Payer: Self-pay

## 2021-04-04 DIAGNOSIS — E785 Hyperlipidemia, unspecified: Secondary | ICD-10-CM | POA: Insufficient documentation

## 2021-04-04 DIAGNOSIS — C3432 Malignant neoplasm of lower lobe, left bronchus or lung: Secondary | ICD-10-CM | POA: Insufficient documentation

## 2021-04-04 DIAGNOSIS — I700 Atherosclerosis of aorta: Secondary | ICD-10-CM | POA: Insufficient documentation

## 2021-04-04 DIAGNOSIS — I10 Essential (primary) hypertension: Secondary | ICD-10-CM | POA: Diagnosis not present

## 2021-04-04 DIAGNOSIS — R109 Unspecified abdominal pain: Secondary | ICD-10-CM | POA: Diagnosis not present

## 2021-04-04 DIAGNOSIS — Z880 Allergy status to penicillin: Secondary | ICD-10-CM | POA: Diagnosis not present

## 2021-04-04 DIAGNOSIS — C3490 Malignant neoplasm of unspecified part of unspecified bronchus or lung: Secondary | ICD-10-CM

## 2021-04-04 DIAGNOSIS — Z90722 Acquired absence of ovaries, bilateral: Secondary | ICD-10-CM | POA: Diagnosis not present

## 2021-04-04 DIAGNOSIS — R21 Rash and other nonspecific skin eruption: Secondary | ICD-10-CM | POA: Diagnosis not present

## 2021-04-04 DIAGNOSIS — K449 Diaphragmatic hernia without obstruction or gangrene: Secondary | ICD-10-CM | POA: Diagnosis not present

## 2021-04-04 DIAGNOSIS — K5730 Diverticulosis of large intestine without perforation or abscess without bleeding: Secondary | ICD-10-CM | POA: Insufficient documentation

## 2021-04-04 DIAGNOSIS — R0609 Other forms of dyspnea: Secondary | ICD-10-CM | POA: Insufficient documentation

## 2021-04-04 DIAGNOSIS — Z79899 Other long term (current) drug therapy: Secondary | ICD-10-CM | POA: Diagnosis not present

## 2021-04-04 LAB — CMP (CANCER CENTER ONLY)
ALT: 16 U/L (ref 0–44)
AST: 26 U/L (ref 15–41)
Albumin: 3.7 g/dL (ref 3.5–5.0)
Alkaline Phosphatase: 66 U/L (ref 38–126)
Anion gap: 10 (ref 5–15)
BUN: 14 mg/dL (ref 8–23)
CO2: 28 mmol/L (ref 22–32)
Calcium: 9.2 mg/dL (ref 8.9–10.3)
Chloride: 101 mmol/L (ref 98–111)
Creatinine: 0.93 mg/dL (ref 0.44–1.00)
GFR, Estimated: >60 mL/min (ref >60)
Glucose, Bld: 91 mg/dL (ref 70–99)
Potassium: 4.1 mmol/L (ref 3.5–5.1)
Sodium: 139 mmol/L (ref 135–145)
Total Bilirubin: 0.7 mg/dL (ref 0.3–1.2)
Total Protein: 7.1 g/dL (ref 6.5–8.1)

## 2021-04-04 LAB — CBC WITH DIFFERENTIAL (CANCER CENTER ONLY)
Abs Immature Granulocytes: 0.01 10*3/uL (ref 0.00–0.07)
Basophils Absolute: 0 10*3/uL (ref 0.0–0.1)
Basophils Relative: 1 %
Eosinophils Absolute: 0.2 10*3/uL (ref 0.0–0.5)
Eosinophils Relative: 4 %
HCT: 37.8 % (ref 36.0–46.0)
Hemoglobin: 12.9 g/dL (ref 12.0–15.0)
Immature Granulocytes: 0 %
Lymphocytes Relative: 21 %
Lymphs Abs: 1.2 10*3/uL (ref 0.7–4.0)
MCH: 28.3 pg (ref 26.0–34.0)
MCHC: 34.1 g/dL (ref 30.0–36.0)
MCV: 82.9 fL (ref 80.0–100.0)
Monocytes Absolute: 0.6 10*3/uL (ref 0.1–1.0)
Monocytes Relative: 10 %
Neutro Abs: 3.9 10*3/uL (ref 1.7–7.7)
Neutrophils Relative %: 64 %
Platelet Count: 187 10*3/uL (ref 150–400)
RBC: 4.56 MIL/uL (ref 3.87–5.11)
RDW: 12.9 % (ref 11.5–15.5)
WBC Count: 5.9 10*3/uL (ref 4.0–10.5)
nRBC: 0 % (ref 0.0–0.2)

## 2021-04-04 MED ORDER — IOHEXOL 300 MG/ML  SOLN
100.0000 mL | Freq: Once | INTRAMUSCULAR | Status: AC | PRN
  Administered 2021-04-04: 100 mL via INTRAVENOUS

## 2021-04-04 MED ORDER — SODIUM CHLORIDE (PF) 0.9 % IJ SOLN
INTRAMUSCULAR | Status: AC
  Filled 2021-04-04: qty 50

## 2021-04-06 ENCOUNTER — Encounter: Payer: Self-pay | Admitting: Internal Medicine

## 2021-04-06 ENCOUNTER — Inpatient Hospital Stay (HOSPITAL_BASED_OUTPATIENT_CLINIC_OR_DEPARTMENT_OTHER): Payer: Medicare Other | Admitting: Internal Medicine

## 2021-04-06 ENCOUNTER — Other Ambulatory Visit: Payer: Self-pay

## 2021-04-06 VITALS — BP 146/86 | HR 79 | Temp 97.7°F | Resp 19 | Ht 60.0 in | Wt 166.3 lb

## 2021-04-06 DIAGNOSIS — I10 Essential (primary) hypertension: Secondary | ICD-10-CM | POA: Diagnosis not present

## 2021-04-06 DIAGNOSIS — C3492 Malignant neoplasm of unspecified part of left bronchus or lung: Secondary | ICD-10-CM | POA: Diagnosis not present

## 2021-04-06 DIAGNOSIS — C3490 Malignant neoplasm of unspecified part of unspecified bronchus or lung: Secondary | ICD-10-CM

## 2021-04-06 DIAGNOSIS — Z5111 Encounter for antineoplastic chemotherapy: Secondary | ICD-10-CM

## 2021-04-06 DIAGNOSIS — C3432 Malignant neoplasm of lower lobe, left bronchus or lung: Secondary | ICD-10-CM | POA: Diagnosis not present

## 2021-04-06 NOTE — Progress Notes (Signed)
[place] [phone]   Fax:([phone]  OFFICE PROGRESS NOTE  [name], MD [address] [place] [address]  DIAGNOSIS: Stage IV (T2a, N0, M1a) non-small cell lung cancer, adenocarcinoma with positive EGFR mutation with deletion in exon 19 diagnosed in June 2016 and presented with large mass in the left lower lobe in addition to multiple bilateral pulmonary nodules  PRIOR THERAPY:  1) Gilotrif 40 mg by mouth daily started 05/11/2015, status post 9 months of treatment discontinued on 02/17/2016 secondary to extensive skin rash. 2) SBRT to the enlarging left upper lobe nodule under the care of Dr. Lisbeth Renshaw.  CURRENT THERAPY: Gilotrif 30 mg by mouth daily started 02/21/2016 status post 61 months of treatment.  INTERVAL HISTORY: Elizabeth [name] 75 y.o. female returns to the clinic today for follow-up visit.  The patient is feeling fine today with no concerning complaints except for few episodes of diarrhea.  She denied having any current nausea, vomiting but has intermittent left lower quadrant abdominal pain with no constipation, melena or hematochezia.  She denied having any chest pain, shortness of breath, cough or hemoptysis.  She denied having any fever or chills.  She has no significant weight loss or night sweats.  She has no headache or visual changes.  She continues to tolerate her treatment with Gilotrif fairly well.  The patient had repeat CT scan of the chest, abdomen pelvis performed recently and she is here for evaluation and discussion of her risk her results.  MEDICAL HISTORY: Past Medical History:  Diagnosis Date   Adenocarcinoma of left lung, stage 4 ([place]) 04/05/2015   Biopsy confirmed CT SCAN: There are innumerable bilateral pulmonary nodules. These range in size from about 5 mm to 2 cm. They demonstrate irregular indistinct borders, the larger ones demonstrating spiculation. PET SCAN: Diffuse pulmonary metastatic  disease with a 4 cm dominant left lower low lung lesion. No enlarged or hypermetabolic mediastinal or hilar adenopathy.   Allergy    Anxiety    Cancer ([place])    Change of skin related to chemotherapy 09/25/2017   Drug-induced skin rash 01/17/2016   Hyperlipidemia    Hypertension    Insomnia    Prediabetes    Thyroid disease     ALLERGIES:  is allergic to penicillins.  MEDICATIONS:  Current Outpatient Medications  Medication Sig Dispense Refill   afatinib dimaleate (GILOTRIF) 30 MG tablet TAKE 1 TABLET (30MG) BY MOUTH ONCE DAILY. TAKE ON AN EMPTY STOMACH 1 HOUR BEFORE OR 2 HOURS AFTER A MEAL 30 tablet 2   ALPRAZolam (XANAX) 1 MG tablet TAKE ONE-HALF TO 1 TABLET BY MOUTH 2 TO 3 TIMES DAILY ONLY IF NEEDED FOR PANIC/ANXIETY, NOT TO EXCEED 5 DAYS PER WEEK TO AVOID ADDICTION/DEMENTIA 45 tablet 0   aspirin 81 MG tablet Take 81 mg by mouth at bedtime.      Cholecalciferol (VITAMIN D3) 10000 UNITS TABS Take 10,000 Units by mouth 3 (three) times a week.      Cyanocobalamin (VITAMIN B 12 PO) Take 1 tablet by mouth daily.     ibuprofen (ADVIL,MOTRIN) 200 MG tablet Take 200 mg by mouth every 6 (six) hours as needed.     levothyroxine (SYNTHROID) 50 MCG tablet TAKE 1 TABLET BY MOUTH DAILY ON AN EMPTY STOMACH WITH ONLY WATER FOR 30 MINUTES. NO ANTACID, CALCIUM, OR MAGNESIUM FOR 4 HOURS. AVOID BIOTIN 90 tablet 3   Melatonin 10 MG CAPS Take 1 capsule by mouth at  bedtime as needed.     meloxicam (MOBIC) 15 MG tablet Take 15 mg by mouth daily.      methocarbamol (ROBAXIN) 500 MG tablet TAKE 1 TABLET BY MOUTH EVERY 6 TO 8 HOURS AS NEEDED FOR DAYTIME MUSCLE SPASMS/TENSION     montelukast (SINGULAIR) 10 MG tablet TAKE 1 TABLET BY MOUTH DAILY FOR ALLERGIES 90 tablet 3   Promethazine-Codeine 6.25-10 MG/5ML SOLN TAKE 5 TO 10 ML BY MOUTH EVERY 4 HOURS AS NEEDED FOR COUGH OR CONGESTION (Patient not taking: Reported on 12/09/2020) 360 mL 0   No current facility-administered medications for this visit.    SURGICAL  HISTORY:  Past Surgical History:  Procedure Laterality Date   ABDOMINAL HYSTERECTOMY  1995   w BSO   TONSILLECTOMY AND ADENOIDECTOMY      REVIEW OF SYSTEMS:  Constitutional: negative Eyes: negative Ears, nose, mouth, throat, and face: negative Respiratory: positive for dyspnea on exertion Cardiovascular: negative Gastrointestinal: positive for abdominal pain and diarrhea Genitourinary:negative Integument/breast: negative Hematologic/lymphatic: negative Musculoskeletal:negative Neurological: negative Behavioral/Psych: negative Endocrine: negative Allergic/Immunologic: negative   PHYSICAL EXAMINATION: General appearance: alert, cooperative, and no distress Head: Normocephalic, without obvious abnormality, atraumatic Neck: no adenopathy, no JVD, supple, symmetrical, trachea midline, and thyroid not enlarged, symmetric, no tenderness/mass/nodules Lymph nodes: Cervical, supraclavicular, and axillary nodes normal. Resp: clear to auscultation bilaterally Back: symmetric, no curvature. ROM normal. No CVA tenderness. Cardio: regular rate and rhythm, S1, S2 normal, no murmur, click, rub or gallop GI: soft, non-tender; bowel sounds normal; no masses,  no organomegaly Extremities: extremities normal, atraumatic, no cyanosis or edema Neurologic: Alert and oriented X 3, normal strength and tone. Normal symmetric reflexes. Normal coordination and gait  ECOG PERFORMANCE STATUS: 1 - Symptomatic but completely ambulatory  Blood pressure (!) 146/86, pulse 79, temperature 97.7 F (36.5 C), temperature source Tympanic, resp. rate 19, height 5' (1.524 m), weight 166 lb 4.8 oz (75.4 kg), SpO2 99 %.  LABORATORY DATA: Lab Results  Component Value Date   WBC 5.9 04/04/2021   HGB 12.9 04/04/2021   HCT 37.8 04/04/2021   MCV 82.9 04/04/2021   PLT 187 04/04/2021      Chemistry      Component Value Date/Time   NA 139 04/04/2021 0955   NA 139 10/26/2017 1252   K 4.1 04/04/2021 0955   K 4.1  10/26/2017 1252   CL 101 04/04/2021 0955   CO2 28 04/04/2021 0955   CO2 28 10/26/2017 1252   BUN 14 04/04/2021 0955   BUN 15.7 10/26/2017 1252   CREATININE 0.93 04/04/2021 0955   CREATININE 0.91 11/18/2020 1057   CREATININE 1.0 10/26/2017 1252      Component Value Date/Time   CALCIUM 9.2 04/04/2021 0955   CALCIUM 8.8 10/26/2017 1252   ALKPHOS 66 04/04/2021 0955   ALKPHOS 70 10/26/2017 1252   AST 26 04/04/2021 0955   AST 19 10/26/2017 1252   ALT 16 04/04/2021 0955   ALT 22 10/26/2017 1252   BILITOT 0.7 04/04/2021 0955   BILITOT 0.90 10/26/2017 1252       RADIOGRAPHIC STUDIES: CT Chest W Contrast  Result Date: 04/04/2021 CLINICAL DATA:  Metastatic non-small cell lung cancer restaging EXAM: CT CHEST, ABDOMEN, AND PELVIS WITH CONTRAST TECHNIQUE: Multidetector CT imaging of the chest, abdomen and pelvis was performed following the standard protocol during bolus administration of intravenous contrast. CONTRAST:  127m OMNIPAQUE IOHEXOL 300 MG/ML SOLN, additional oral enteric contrast COMPARISON:  CT abdomen pelvis, 01/20/2021, CT chest abdomen pelvis, 12/07/2020 FINDINGS: CT CHEST FINDINGS  Cardiovascular: No significant vascular findings. Normal heart size. No pericardial effusion. Mediastinum/Nodes: No enlarged mediastinal, hilar, or axillary lymph nodes. Small hiatal hernia. Thyroid gland, trachea, and esophagus demonstrate no significant findings. Lungs/Pleura: Unchanged post treatment appearance of the perihilar and infrahilar left lung with dense post treatment fibrosis, consolidation, and volume loss (series 7, image 70). Unchanged background of numerous irregular pulmonary nodules and opacities, largest again in the peripheral left upper lobe measuring 1.3 x 1.0 cm (series 7, image 40). No pleural effusion or pneumothorax. Musculoskeletal: No chest wall mass or suspicious bone lesions identified. CT ABDOMEN PELVIS FINDINGS Hepatobiliary: No solid liver abnormality is seen. Unchanged,  benign hemangioma of the posterior liver dome (series 2, image 40). No gallstones, gallbladder wall thickening, or biliary dilatation. Pancreas: Unremarkable. No pancreatic ductal dilatation or surrounding inflammatory changes. Spleen: Normal in size without significant abnormality. Adrenals/Urinary Tract: Adrenal glands are unremarkable. Kidneys are normal, without renal calculi, solid lesion, or hydronephrosis. Bladder is unremarkable. Stomach/Bowel: Stomach is within normal limits. Appendix appears normal. No evidence of bowel wall thickening, distention, or inflammatory changes. Sigmoid diverticulosis. Vascular/Lymphatic: Aortic atherosclerosis. No enlarged abdominal or pelvic lymph nodes. Reproductive: Status post hysterectomy. Other: No abdominal wall hernia or abnormality. No abdominopelvic ascites. Slight interval enlargement of a soft tissue nodule in the left retroperitoneal fat overlying the iliacus muscle, measuring 1.0 x 0.8 cm (series 2, image 84). Musculoskeletal: No acute or significant osseous findings. IMPRESSION: 1. Unchanged post treatment appearance of the perihilar and infrahilar left lung with dense post treatment fibrosis, consolidation, and volume loss. 2. Unchanged background of numerous irregular pulmonary nodules and opacities, largest again in the peripheral left upper lobe measuring 1.3 x 1.0 cm, consistent with treated metastatic disease. 3. Slight interval enlargement of a soft tissue nodule in the left retroperitoneal fat overlying the iliacus muscle, measuring 1.0 x 0.8 cm. Continued growth remains highly concerning for metastatic disease, although a solitary, isolated soft tissue metastasis is somewhat unusual. This could be characterized for metabolic activity by FDG PET-CT given size and location. Alternately, continued attention on follow-up. Aortic Atherosclerosis (ICD10-I70.0). Electronically Signed   By: Eddie Candle M.D.   On: 04/04/2021 15:00   CT Abdomen Pelvis W  Contrast  Result Date: 04/04/2021 CLINICAL DATA:  Metastatic non-small cell lung cancer restaging EXAM: CT CHEST, ABDOMEN, AND PELVIS WITH CONTRAST TECHNIQUE: Multidetector CT imaging of the chest, abdomen and pelvis was performed following the standard protocol during bolus administration of intravenous contrast. CONTRAST:  117m OMNIPAQUE IOHEXOL 300 MG/ML SOLN, additional oral enteric contrast COMPARISON:  CT abdomen pelvis, 01/20/2021, CT chest abdomen pelvis, 12/07/2020 FINDINGS: CT CHEST FINDINGS Cardiovascular: No significant vascular findings. Normal heart size. No pericardial effusion. Mediastinum/Nodes: No enlarged mediastinal, hilar, or axillary lymph nodes. Small hiatal hernia. Thyroid gland, trachea, and esophagus demonstrate no significant findings. Lungs/Pleura: Unchanged post treatment appearance of the perihilar and infrahilar left lung with dense post treatment fibrosis, consolidation, and volume loss (series 7, image 70). Unchanged background of numerous irregular pulmonary nodules and opacities, largest again in the peripheral left upper lobe measuring 1.3 x 1.0 cm (series 7, image 40). No pleural effusion or pneumothorax. Musculoskeletal: No chest wall mass or suspicious bone lesions identified. CT ABDOMEN PELVIS FINDINGS Hepatobiliary: No solid liver abnormality is seen. Unchanged, benign hemangioma of the posterior liver dome (series 2, image 40). No gallstones, gallbladder wall thickening, or biliary dilatation. Pancreas: Unremarkable. No pancreatic ductal dilatation or surrounding inflammatory changes. Spleen: Normal in size without significant abnormality. Adrenals/Urinary Tract: Adrenal glands are  unremarkable. Kidneys are normal, without renal calculi, solid lesion, or hydronephrosis. Bladder is unremarkable. Stomach/Bowel: Stomach is within normal limits. Appendix appears normal. No evidence of bowel wall thickening, distention, or inflammatory changes. Sigmoid diverticulosis.  Vascular/Lymphatic: Aortic atherosclerosis. No enlarged abdominal or pelvic lymph nodes. Reproductive: Status post hysterectomy. Other: No abdominal wall hernia or abnormality. No abdominopelvic ascites. Slight interval enlargement of a soft tissue nodule in the left retroperitoneal fat overlying the iliacus muscle, measuring 1.0 x 0.8 cm (series 2, image 84). Musculoskeletal: No acute or significant osseous findings. IMPRESSION: 1. Unchanged post treatment appearance of the perihilar and infrahilar left lung with dense post treatment fibrosis, consolidation, and volume loss. 2. Unchanged background of numerous irregular pulmonary nodules and opacities, largest again in the peripheral left upper lobe measuring 1.3 x 1.0 cm, consistent with treated metastatic disease. 3. Slight interval enlargement of a soft tissue nodule in the left retroperitoneal fat overlying the iliacus muscle, measuring 1.0 x 0.8 cm. Continued growth remains highly concerning for metastatic disease, although a solitary, isolated soft tissue metastasis is somewhat unusual. This could be characterized for metabolic activity by FDG PET-CT given size and location. Alternately, continued attention on follow-up. Aortic Atherosclerosis (ICD10-I70.0). Electronically Signed   By: Eddie Candle M.D.   On: 04/04/2021 15:00     ASSESSMENT AND PLAN:  This is a very pleasant 75 years old white female with stage IV non-small cell lung cancer, adenocarcinoma with positive EGFR mutation with deletion in exon 19 and currently undergoing treatment with Gilotrif initially at a dose of 40 mg for 9 months followed by 30 mg by mouth daily status post 61 months.  She also underwent SBRT to the enlarging left upper lobe lung nodule. The patient continues to tolerate her treatment with Gilotrif. She had repeat CT scan of the chest, abdomen pelvis performed recently.  I personally and independently reviewed the scans and discussed the result with the patient  today. Her scan showed a stable disease except for a slightly increased size of the left retroperitoneal soft tissue nodule still suspicious for isolated tissue metastasis. I recommended for the patient to have a PET scan for further evaluation of this lesion and also to rule out any other metastatic disease. I will see her back for follow-up visit in 2 weeks for evaluation for discussion of the PET scan and further recommendation regarding treatment of her condition. She will continue her current treatment with Gilotrif with the same dose for now. The patient was advised to call immediately if she has any concerning symptoms in the interval. The patient voices understanding of current disease status and treatment options and is in agreement with the current care plan. All questions were answered. The patient knows to call the clinic with any problems, questions or concerns. We can certainly see the patient much sooner if necessary.  Disclaimer: This note was dictated with voice recognition software. Similar sounding words can inadvertently be transcribed and may not be corrected upon review.

## 2021-04-18 ENCOUNTER — Other Ambulatory Visit (HOSPITAL_COMMUNITY): Payer: Self-pay

## 2021-04-18 ENCOUNTER — Other Ambulatory Visit: Payer: Self-pay | Admitting: Internal Medicine

## 2021-04-18 MED ORDER — AFATINIB DIMALEATE 30 MG PO TABS
ORAL_TABLET | ORAL | 2 refills | Status: DC
  Filled 2021-04-18: qty 30, 30d supply, fill #0
  Filled 2021-05-18: qty 30, 30d supply, fill #1
  Filled 2021-06-23: qty 30, 30d supply, fill #2

## 2021-04-19 ENCOUNTER — Other Ambulatory Visit (HOSPITAL_COMMUNITY): Payer: Self-pay

## 2021-04-21 ENCOUNTER — Other Ambulatory Visit (HOSPITAL_COMMUNITY): Payer: Self-pay

## 2021-04-22 ENCOUNTER — Other Ambulatory Visit: Payer: Self-pay

## 2021-04-22 ENCOUNTER — Ambulatory Visit (HOSPITAL_COMMUNITY)
Admission: RE | Admit: 2021-04-22 | Discharge: 2021-04-22 | Disposition: A | Discharge status: Home / Self Care | Payer: Medicare Other | Source: Ambulatory Visit | Attending: Internal Medicine | Admitting: Internal Medicine

## 2021-04-22 DIAGNOSIS — C3492 Malignant neoplasm of unspecified part of left bronchus or lung: Secondary | ICD-10-CM | POA: Diagnosis present

## 2021-04-22 DIAGNOSIS — C3490 Malignant neoplasm of unspecified part of unspecified bronchus or lung: Secondary | ICD-10-CM

## 2021-04-22 LAB — GLUCOSE, CAPILLARY: Glucose-Capillary: 88 mg/dL (ref 70–99)

## 2021-04-22 MED ORDER — FLUDEOXYGLUCOSE F - 18 (FDG) INJECTION
8.3200 | Freq: Once | INTRAVENOUS | Status: AC
  Administered 2021-04-22: 8.32 via INTRAVENOUS

## 2021-04-26 ENCOUNTER — Inpatient Hospital Stay: Payer: Medicare Other | Attending: Internal Medicine | Admitting: Internal Medicine

## 2021-04-26 ENCOUNTER — Other Ambulatory Visit: Payer: Self-pay

## 2021-04-26 VITALS — BP 159/86 | HR 97 | Temp 98.5°F | Resp 19 | Ht 60.0 in | Wt 166.9 lb

## 2021-04-26 DIAGNOSIS — Z5111 Encounter for antineoplastic chemotherapy: Secondary | ICD-10-CM

## 2021-04-26 DIAGNOSIS — Z90710 Acquired absence of both cervix and uterus: Secondary | ICD-10-CM | POA: Insufficient documentation

## 2021-04-26 DIAGNOSIS — Z880 Allergy status to penicillin: Secondary | ICD-10-CM | POA: Diagnosis not present

## 2021-04-26 DIAGNOSIS — Z79899 Other long term (current) drug therapy: Secondary | ICD-10-CM | POA: Diagnosis not present

## 2021-04-26 DIAGNOSIS — I700 Atherosclerosis of aorta: Secondary | ICD-10-CM | POA: Insufficient documentation

## 2021-04-26 DIAGNOSIS — C3432 Malignant neoplasm of lower lobe, left bronchus or lung: Secondary | ICD-10-CM | POA: Insufficient documentation

## 2021-04-26 DIAGNOSIS — K449 Diaphragmatic hernia without obstruction or gangrene: Secondary | ICD-10-CM | POA: Diagnosis not present

## 2021-04-26 DIAGNOSIS — C3492 Malignant neoplasm of unspecified part of left bronchus or lung: Secondary | ICD-10-CM | POA: Diagnosis not present

## 2021-04-26 DIAGNOSIS — R21 Rash and other nonspecific skin eruption: Secondary | ICD-10-CM | POA: Insufficient documentation

## 2021-04-26 DIAGNOSIS — F419 Anxiety disorder, unspecified: Secondary | ICD-10-CM | POA: Diagnosis not present

## 2021-04-26 DIAGNOSIS — Z90722 Acquired absence of ovaries, bilateral: Secondary | ICD-10-CM | POA: Diagnosis not present

## 2021-04-26 DIAGNOSIS — E785 Hyperlipidemia, unspecified: Secondary | ICD-10-CM | POA: Diagnosis not present

## 2021-04-26 DIAGNOSIS — R5383 Other fatigue: Secondary | ICD-10-CM | POA: Diagnosis not present

## 2021-04-26 DIAGNOSIS — M2550 Pain in unspecified joint: Secondary | ICD-10-CM | POA: Diagnosis not present

## 2021-04-26 DIAGNOSIS — K5730 Diverticulosis of large intestine without perforation or abscess without bleeding: Secondary | ICD-10-CM | POA: Diagnosis not present

## 2021-04-26 DIAGNOSIS — I10 Essential (primary) hypertension: Secondary | ICD-10-CM

## 2021-04-26 NOTE — Progress Notes (Signed)
[place] [phone]   [phone]) [phone]  OFFICE PROGRESS NOTE  [name], MD [address] [place] [address]  DIAGNOSIS: Stage IV (T2a, N0, M1a) non-small cell lung cancer, adenocarcinoma with positive EGFR mutation with deletion in exon 19 diagnosed in June 2016 and presented with large mass in the left lower lobe in addition to multiple bilateral pulmonary nodules  PRIOR THERAPY:  1) Gilotrif 40 mg by mouth daily started 05/11/2015, status post 9 months of treatment discontinued on 02/17/2016 secondary to extensive skin rash. 2) SBRT to the enlarging left upper lobe nodule under the care of Dr. Lisbeth Renshaw.  CURRENT THERAPY: Gilotrif 30 mg by mouth daily started 02/21/2016 status post 62 months of treatment.  INTERVAL HISTORY: Elizabeth [name] 75 y.o. female returns to the clinic today for follow-up visit accompanied by her husband.  The patient is feeling fine today with no concerning complaints except for occasional tenderness in the left shoulder area.  She denied having any current chest pain, shortness of breath, cough or hemoptysis.  She denied having any fever or chills.  She has no nausea, vomiting, diarrhea or constipation.  She continues to have mild rash.  She has no recent weight loss or night sweats.  She continues to tolerate her treatment with Gilotrif fairly well.  The patient had a PET scan performed recently for suspicious disease progression and she is here for evaluation and discussion of her PET scan results and treatment options.  MEDICAL HISTORY: Past Medical History:  Diagnosis Date   Adenocarcinoma of left lung, stage 4 ([place]) 04/05/2015   Biopsy confirmed CT SCAN: There are innumerable bilateral pulmonary nodules. These range in size from about 5 mm to 2 cm. They demonstrate irregular indistinct borders, the larger ones demonstrating spiculation. PET SCAN: Diffuse pulmonary metastatic disease with a 4 cm  dominant left lower low lung lesion. No enlarged or hypermetabolic mediastinal or hilar adenopathy.   Allergy    Anxiety    Cancer ([place])    Change of skin related to chemotherapy 09/25/2017   Drug-induced skin rash 01/17/2016   Hyperlipidemia    Hypertension    Insomnia    Prediabetes    Thyroid disease     ALLERGIES:  is allergic to penicillins.  MEDICATIONS:  Current Outpatient Medications  Medication Sig Dispense Refill   afatinib dimaleate (GILOTRIF) 30 MG tablet TAKE 1 TABLET (30MG) BY MOUTH ONCE DAILY. TAKE ON AN EMPTY STOMACH 1 HOUR BEFORE OR 2 HOURS AFTER A MEAL 30 tablet 2   ALPRAZolam (XANAX) 1 MG tablet Take 1 mg by mouth daily.     aspirin 81 MG tablet Take 81 mg by mouth at bedtime.      Cholecalciferol (VITAMIN D3) 10000 UNITS TABS Take 10,000 Units by mouth 3 (three) times a week.      Cyanocobalamin (VITAMIN B 12 PO) Take 1 tablet by mouth daily.     hydrochlorothiazide (HYDRODIURIL) 25 MG tablet Take 1 tablet by mouth daily.     ibuprofen (ADVIL,MOTRIN) 200 MG tablet Take 200 mg by mouth every 6 (six) hours as needed.     levothyroxine (SYNTHROID) 50 MCG tablet TAKE 1 TABLET BY MOUTH DAILY ON AN EMPTY STOMACH WITH ONLY WATER FOR 30 MINUTES. NO ANTACID, CALCIUM, OR MAGNESIUM FOR 4 HOURS. AVOID BIOTIN 90 tablet 3   Melatonin 10 MG CAPS Take 1 capsule by mouth at bedtime as needed.     meloxicam (MOBIC) 15 MG  tablet Take 15 mg by mouth daily.      methocarbamol (ROBAXIN) 500 MG tablet TAKE 1 TABLET BY MOUTH EVERY 6 TO 8 HOURS AS NEEDED FOR DAYTIME MUSCLE SPASMS/TENSION     montelukast (SINGULAIR) 10 MG tablet TAKE 1 TABLET BY MOUTH DAILY FOR ALLERGIES 90 tablet 3   Promethazine-Codeine 6.25-10 MG/5ML SOLN TAKE 5 TO 10 ML BY MOUTH EVERY 4 HOURS AS NEEDED FOR COUGH OR CONGESTION (Patient not taking: No sig reported) 360 mL 0   No current facility-administered medications for this visit.    SURGICAL HISTORY:  Past Surgical History:  Procedure Laterality Date   ABDOMINAL  HYSTERECTOMY  1995   w BSO   TONSILLECTOMY AND ADENOIDECTOMY      REVIEW OF SYSTEMS:  Constitutional: positive for fatigue Eyes: negative Ears, nose, mouth, throat, and face: negative Respiratory: negative Cardiovascular: negative Gastrointestinal: positive for diarrhea Genitourinary:negative Integument/breast: negative Hematologic/lymphatic: negative Musculoskeletal:positive for arthralgias Neurological: negative Behavioral/Psych: negative Endocrine: negative Allergic/Immunologic: negative   PHYSICAL EXAMINATION: General appearance: alert, cooperative, and no distress Head: Normocephalic, without obvious abnormality, atraumatic Neck: no adenopathy, no JVD, supple, symmetrical, trachea midline, and thyroid not enlarged, symmetric, no tenderness/mass/nodules Lymph nodes: Cervical, supraclavicular, and axillary nodes normal. Resp: clear to auscultation bilaterally Back: symmetric, no curvature. ROM normal. No CVA tenderness. Cardio: regular rate and rhythm, S1, S2 normal, no murmur, click, rub or gallop GI: soft, non-tender; bowel sounds normal; no masses,  no organomegaly Extremities: extremities normal, atraumatic, no cyanosis or edema Neurologic: Alert and oriented X 3, normal strength and tone. Normal symmetric reflexes. Normal coordination and gait  ECOG PERFORMANCE STATUS: 1 - Symptomatic but completely ambulatory  Blood pressure (!) 159/86, pulse 97, temperature 98.5 F (36.9 C), temperature source Tympanic, resp. rate 19, height 5' (1.524 m), weight 166 lb 14.4 oz (75.7 kg), SpO2 97 %.  LABORATORY DATA: Lab Results  Component Value Date   WBC 5.9 04/04/2021   HGB 12.9 04/04/2021   HCT 37.8 04/04/2021   MCV 82.9 04/04/2021   PLT 187 04/04/2021      Chemistry      Component Value Date/Time   NA 139 04/04/2021 0955   NA 139 10/26/2017 1252   K 4.1 04/04/2021 0955   K 4.1 10/26/2017 1252   CL 101 04/04/2021 0955   CO2 28 04/04/2021 0955   CO2 28 10/26/2017  1252   BUN 14 04/04/2021 0955   BUN 15.7 10/26/2017 1252   CREATININE 0.93 04/04/2021 0955   CREATININE 0.91 11/18/2020 1057   CREATININE 1.0 10/26/2017 1252      Component Value Date/Time   CALCIUM 9.2 04/04/2021 0955   CALCIUM 8.8 10/26/2017 1252   ALKPHOS 66 04/04/2021 0955   ALKPHOS 70 10/26/2017 1252   AST 26 04/04/2021 0955   AST 19 10/26/2017 1252   ALT 16 04/04/2021 0955   ALT 22 10/26/2017 1252   BILITOT 0.7 04/04/2021 0955   BILITOT 0.90 10/26/2017 1252       RADIOGRAPHIC STUDIES: CT Chest W Contrast  Result Date: 04/04/2021 CLINICAL DATA:  Metastatic non-small cell lung cancer restaging EXAM: CT CHEST, ABDOMEN, AND PELVIS WITH CONTRAST TECHNIQUE: Multidetector CT imaging of the chest, abdomen and pelvis was performed following the standard protocol during bolus administration of intravenous contrast. CONTRAST:  142m OMNIPAQUE IOHEXOL 300 MG/ML SOLN, additional oral enteric contrast COMPARISON:  CT abdomen pelvis, 01/20/2021, CT chest abdomen pelvis, 12/07/2020 FINDINGS: CT CHEST FINDINGS Cardiovascular: No significant vascular findings. Normal heart size. No pericardial effusion. Mediastinum/Nodes: No enlarged  mediastinal, hilar, or axillary lymph nodes. Small hiatal hernia. Thyroid gland, trachea, and esophagus demonstrate no significant findings. Lungs/Pleura: Unchanged post treatment appearance of the perihilar and infrahilar left lung with dense post treatment fibrosis, consolidation, and volume loss (series 7, image 70). Unchanged background of numerous irregular pulmonary nodules and opacities, largest again in the peripheral left upper lobe measuring 1.3 x 1.0 cm (series 7, image 40). No pleural effusion or pneumothorax. Musculoskeletal: No chest wall mass or suspicious bone lesions identified. CT ABDOMEN PELVIS FINDINGS Hepatobiliary: No solid liver abnormality is seen. Unchanged, benign hemangioma of the posterior liver dome (series 2, image 40). No gallstones,  gallbladder wall thickening, or biliary dilatation. Pancreas: Unremarkable. No pancreatic ductal dilatation or surrounding inflammatory changes. Spleen: Normal in size without significant abnormality. Adrenals/Urinary Tract: Adrenal glands are unremarkable. Kidneys are normal, without renal calculi, solid lesion, or hydronephrosis. Bladder is unremarkable. Stomach/Bowel: Stomach is within normal limits. Appendix appears normal. No evidence of bowel wall thickening, distention, or inflammatory changes. Sigmoid diverticulosis. Vascular/Lymphatic: Aortic atherosclerosis. No enlarged abdominal or pelvic lymph nodes. Reproductive: Status post hysterectomy. Other: No abdominal wall hernia or abnormality. No abdominopelvic ascites. Slight interval enlargement of a soft tissue nodule in the left retroperitoneal fat overlying the iliacus muscle, measuring 1.0 x 0.8 cm (series 2, image 84). Musculoskeletal: No acute or significant osseous findings. IMPRESSION: 1. Unchanged post treatment appearance of the perihilar and infrahilar left lung with dense post treatment fibrosis, consolidation, and volume loss. 2. Unchanged background of numerous irregular pulmonary nodules and opacities, largest again in the peripheral left upper lobe measuring 1.3 x 1.0 cm, consistent with treated metastatic disease. 3. Slight interval enlargement of a soft tissue nodule in the left retroperitoneal fat overlying the iliacus muscle, measuring 1.0 x 0.8 cm. Continued growth remains highly concerning for metastatic disease, although a solitary, isolated soft tissue metastasis is somewhat unusual. This could be characterized for metabolic activity by FDG PET-CT given size and location. Alternately, continued attention on follow-up. Aortic Atherosclerosis (ICD10-I70.0). Electronically Signed   By: Eddie Candle M.D.   On: 04/04/2021 15:00   CT Abdomen Pelvis W Contrast  Result Date: 04/04/2021 CLINICAL DATA:  Metastatic non-small cell lung cancer  restaging EXAM: CT CHEST, ABDOMEN, AND PELVIS WITH CONTRAST TECHNIQUE: Multidetector CT imaging of the chest, abdomen and pelvis was performed following the standard protocol during bolus administration of intravenous contrast. CONTRAST:  137m OMNIPAQUE IOHEXOL 300 MG/ML SOLN, additional oral enteric contrast COMPARISON:  CT abdomen pelvis, 01/20/2021, CT chest abdomen pelvis, 12/07/2020 FINDINGS: CT CHEST FINDINGS Cardiovascular: No significant vascular findings. Normal heart size. No pericardial effusion. Mediastinum/Nodes: No enlarged mediastinal, hilar, or axillary lymph nodes. Small hiatal hernia. Thyroid gland, trachea, and esophagus demonstrate no significant findings. Lungs/Pleura: Unchanged post treatment appearance of the perihilar and infrahilar left lung with dense post treatment fibrosis, consolidation, and volume loss (series 7, image 70). Unchanged background of numerous irregular pulmonary nodules and opacities, largest again in the peripheral left upper lobe measuring 1.3 x 1.0 cm (series 7, image 40). No pleural effusion or pneumothorax. Musculoskeletal: No chest wall mass or suspicious bone lesions identified. CT ABDOMEN PELVIS FINDINGS Hepatobiliary: No solid liver abnormality is seen. Unchanged, benign hemangioma of the posterior liver dome (series 2, image 40). No gallstones, gallbladder wall thickening, or biliary dilatation. Pancreas: Unremarkable. No pancreatic ductal dilatation or surrounding inflammatory changes. Spleen: Normal in size without significant abnormality. Adrenals/Urinary Tract: Adrenal glands are unremarkable. Kidneys are normal, without renal calculi, solid lesion, or hydronephrosis. Bladder is unremarkable.  Stomach/Bowel: Stomach is within normal limits. Appendix appears normal. No evidence of bowel wall thickening, distention, or inflammatory changes. Sigmoid diverticulosis. Vascular/Lymphatic: Aortic atherosclerosis. No enlarged abdominal or pelvic lymph nodes.  Reproductive: Status post hysterectomy. Other: No abdominal wall hernia or abnormality. No abdominopelvic ascites. Slight interval enlargement of a soft tissue nodule in the left retroperitoneal fat overlying the iliacus muscle, measuring 1.0 x 0.8 cm (series 2, image 84). Musculoskeletal: No acute or significant osseous findings. IMPRESSION: 1. Unchanged post treatment appearance of the perihilar and infrahilar left lung with dense post treatment fibrosis, consolidation, and volume loss. 2. Unchanged background of numerous irregular pulmonary nodules and opacities, largest again in the peripheral left upper lobe measuring 1.3 x 1.0 cm, consistent with treated metastatic disease. 3. Slight interval enlargement of a soft tissue nodule in the left retroperitoneal fat overlying the iliacus muscle, measuring 1.0 x 0.8 cm. Continued growth remains highly concerning for metastatic disease, although a solitary, isolated soft tissue metastasis is somewhat unusual. This could be characterized for metabolic activity by FDG PET-CT given size and location. Alternately, continued attention on follow-up. Aortic Atherosclerosis (ICD10-I70.0). Electronically Signed   By: Eddie Candle M.D.   On: 04/04/2021 15:00   NM PET Image Restage (PS) Skull Base to Thigh  Result Date: 04/24/2021 CLINICAL DATA:  Subsequent treatment strategy for non-small cell lung cancer staging. Chemotherapy 2020 EXAM: NUCLEAR MEDICINE PET SKULL BASE TO THIGH TECHNIQUE: 8.3 mCi F-18 FDG was injected intravenously. Full-ring PET imaging was performed from the skull base to thigh after the radiotracer. CT data was obtained and used for attenuation correction and anatomic localization. Fasting blood glucose: 3 88 mg/dl COMPARISON:  CT 04/04/2021 FINDINGS: Mediastinal blood pool activity: SUV max 2.9 Liver activity: SUV max NA NECK: No hypermetabolic lymph nodes in the neck. Incidental CT findings: none CHEST: Anterior to the LEFT shoulder musculature there is  a small hypermetabolic subcutaneous nodule measuring 12 mm (image 35) with SUV max equal 8.9. Triangular focus of peripheral consolidation in the LEFT upper lobe measuring 17 mm x 11 mm (image 24/8) has associated metabolic activity SUV max equal 4.6. Nodule increased in size from 8 mm 9 mm on CT 11/18/2019. Consolidation in the superior aspect of the LEFT lower lobe measuring 3.1 cm however does not have significant metabolic activity (SUV max equal 2.8). No additional hypermetabolic nodules in the LEFT RIGHT lung. No hypermetabolic mediastinal lymph nodes. Incidental CT findings: none ABDOMEN/PELVIS: The retroperitoneal nodule of concern anterior to the LEFT iliacus muscle measures 9 mm (image 135) and does have associated metabolic activity with SUV max equal 6.0. No additional hypermetabolic foci within the abdomen pelvis. No hypermetabolic adenopathy. Liver normal. Incidental CT findings: none SKELETON: No focal hypermetabolic activity to suggest skeletal metastasis. Incidental CT findings: none IMPRESSION: 1. Hypermetabolic subcutaneous nodule anterior to the LEFT shoulder musculature is concerning for a cutaneous metastasis. This nodule would be amenable to ultrasound guided sampling. 2. Enlarging nodule in the retroperitoneum anterior to the LEFT iliacus muscle has hypermetabolic activity and therefore concerning for a metastatic deposit. 3. Enlarging hypermetabolic nodule / consolidation in the periphery of the LEFT upper lobe is concerning for lung cancer recurrence. Electronically Signed   By: Suzy Bouchard M.D.   On: 04/24/2021 13:08     ASSESSMENT AND PLAN:  This is a very pleasant 75 years old white female with stage IV non-small cell lung cancer, adenocarcinoma with positive EGFR mutation with deletion in exon 19 and currently undergoing treatment with Gilotrif initially at  a dose of 40 mg for 9 months followed by 30 mg by mouth daily status post 62 months.  She also underwent SBRT to the  enlarging left upper lobe lung nodule. The patient continues to tolerate her treatment with Gilotrif fairly well except for mild intermittent diarrhea and rash in the scalp. She was found on recent imaging studies of the chest, abdomen pelvis to have suspicious nodules in the left lung as well as the retroperitoneum suspicious for disease progression. The patient had a PET scan performed recently and it showed hypermetabolic subcutaneous nodule anterior to the left shoulder musculature concerning for cutaneous metastasis in addition to the enlarging nodule in the retroperitoneum anterior to the left iliac [place] muscle that also hypermetabolic and concerning for metastatic deposit with enlarging hypermetabolic nodule and consolidation peripherally in the left upper lobe concerning for lung cancer recurrence. I had a lengthy discussion with the patient and her husband about her condition and treatment options. I discussed with the patient proceeding with ultrasound-guided core biopsy of the left anterior shoulder subcutaneous nodule for confirmation of tissue diagnosis and also for having tissue for molecular studies. Because of the oligometastatic nature of her disease, I will refer the patient to Dr. Sondra Come for consideration of SBRT to these 3 suspicious nodules and she will continue her current treatment with Gilotrif unless the molecular studies showed a resistant actionable mutation. I will see her back for follow-up visit in 1 months for evaluation and repeat blood work. The patient was advised to call immediately if she has any other concerning symptoms in the interval. The patient voices understanding of current disease status and treatment options and is in agreement with the current care plan. All questions were answered. The patient knows to call the clinic with any problems, questions or concerns. We can certainly see the patient much sooner if necessary.  Disclaimer: This note was dictated with  voice recognition software. Similar sounding words can inadvertently be transcribed and may not be corrected upon review.

## 2021-04-27 ENCOUNTER — Encounter (HOSPITAL_COMMUNITY): Payer: Self-pay | Admitting: Radiology

## 2021-04-27 ENCOUNTER — Telehealth: Payer: Self-pay | Admitting: Radiation Oncology

## 2021-04-27 NOTE — Progress Notes (Signed)
Patient Name  Elizabeth [name], Adamek Legal Sex  Female DOB  May 16, 1946 SSN  [id number] Address  [address]  [place] [address] Phone  [phone] [facility])  [phone] (Mobile) *Preferred*     RE: [place] CORE BIOPSY (SOFT TISSUE) Received: Mabeline Caras, [place] Cerise, DO  Jillyn Hidden; P Ir Procedure Requests OK for CT guided biopsy of left shoulder nodule.     Would have [place] available for correlation/performing biopsy, with CT to identify target.     Earleen Newport         Previous Messages    ----- Message -----  From: Garth Bigness D  Sent: 04/26/2021   4:53 PM EDT  To: Ir Procedure Requests  Subject: [place] CORE BIOPSY (SOFT TISSUE)                   Procedure:  [place] CORE BIOPSY (SOFT TISSUE)   Reason:   Adenocarcinoma of left lung, stage 4, Subcutaneous note   History:  CT, NM PET in computer   Provider:  Curt Bears   Provider Contact:  [phone]

## 2021-04-28 ENCOUNTER — Telehealth: Payer: Self-pay | Admitting: Internal Medicine

## 2021-04-28 ENCOUNTER — Other Ambulatory Visit: Payer: Self-pay | Admitting: Radiology

## 2021-04-28 NOTE — Telephone Encounter (Signed)
Scheduled appointment per 07/06 los. Patient is aware.

## 2021-05-02 ENCOUNTER — Other Ambulatory Visit: Payer: Self-pay

## 2021-05-02 ENCOUNTER — Ambulatory Visit (HOSPITAL_COMMUNITY)
Admission: RE | Admit: 2021-05-02 | Discharge: 2021-05-02 | Disposition: A | Discharge status: Home / Self Care | Payer: Medicare Other | Source: Ambulatory Visit | Attending: Internal Medicine | Admitting: Internal Medicine

## 2021-05-02 DIAGNOSIS — C3492 Malignant neoplasm of unspecified part of left bronchus or lung: Secondary | ICD-10-CM | POA: Diagnosis not present

## 2021-05-02 DIAGNOSIS — Z87891 Personal history of nicotine dependence: Secondary | ICD-10-CM | POA: Diagnosis not present

## 2021-05-02 DIAGNOSIS — Z79899 Other long term (current) drug therapy: Secondary | ICD-10-CM | POA: Diagnosis not present

## 2021-05-02 DIAGNOSIS — Z7982 Long term (current) use of aspirin: Secondary | ICD-10-CM | POA: Diagnosis not present

## 2021-05-02 DIAGNOSIS — R2231 Localized swelling, mass and lump, right upper limb: Secondary | ICD-10-CM | POA: Insufficient documentation

## 2021-05-02 LAB — CBC
HCT: 37.7 % (ref 36.0–46.0)
Hemoglobin: 12.5 g/dL (ref 12.0–15.0)
MCH: 28.6 pg (ref 26.0–34.0)
MCHC: 33.2 g/dL (ref 30.0–36.0)
MCV: 86.3 fL (ref 80.0–100.0)
Platelets: 192 10*3/uL (ref 150–400)
RBC: 4.37 MIL/uL (ref 3.87–5.11)
RDW: 12.6 % (ref 11.5–15.5)
WBC: 6.2 10*3/uL (ref 4.0–10.5)
nRBC: 0 % (ref 0.0–0.2)

## 2021-05-02 LAB — PROTIME-INR
INR: 1 (ref 0.8–1.2)
Prothrombin Time: 12.9 seconds (ref 11.4–15.2)

## 2021-05-02 MED ORDER — SODIUM CHLORIDE 0.9 % IV SOLN: INTRAVENOUS | Status: DC

## 2021-05-02 MED ORDER — SODIUM CHLORIDE 0.9 % IV SOLN
INTRAVENOUS | Status: AC | PRN
  Administered 2021-05-02: 10 mL/h via INTRAVENOUS

## 2021-05-02 MED ORDER — MIDAZOLAM HCL 2 MG/2ML IJ SOLN
INTRAMUSCULAR | Status: AC | PRN
  Administered 2021-05-02: 0.5 mg via INTRAVENOUS
  Administered 2021-05-02: 1 mg via INTRAVENOUS
  Administered 2021-05-02: 0.5 mg via INTRAVENOUS

## 2021-05-02 MED ORDER — LIDOCAINE HCL 1 % IJ SOLN
INTRAMUSCULAR | Status: AC
  Filled 2021-05-02: qty 10

## 2021-05-02 MED ORDER — FENTANYL CITRATE (PF) 100 MCG/2ML IJ SOLN
INTRAMUSCULAR | Status: AC
  Filled 2021-05-02: qty 2

## 2021-05-02 MED ORDER — FENTANYL CITRATE (PF) 100 MCG/2ML IJ SOLN
INTRAMUSCULAR | Status: AC | PRN
  Administered 2021-05-02 (×2): 25 ug via INTRAVENOUS
  Administered 2021-05-02: 50 ug via INTRAVENOUS

## 2021-05-02 MED ORDER — MIDAZOLAM HCL 2 MG/2ML IJ SOLN
INTRAMUSCULAR | Status: AC
  Filled 2021-05-02: qty 2

## 2021-05-02 NOTE — H&P (Signed)
Chief Complaint: Patient was seen in consultation today for left shoulder nodule biopsy  Referring Physician(s): Mohamed,Mohamed  Supervising Physician: Corrie Mckusick  Patient Status: [facility] - In-pt  History of Present Illness: Elizabeth [name] is a 75 y.o. female with a medical history significant for anxiety, hypertension and left lung cancer, initially diagnosed in 2016 and currently undergoing treatment. Recent imaging studies show possible disease progression.  NM PET 04/22/21 CHEST: Anterior to the LEFT shoulder musculature there is a small hypermetabolic subcutaneous nodule measuring 12 mm (image 35) with SUV max equal 8.9. IMPRESSION: 1. Hypermetabolic subcutaneous nodule anterior to the LEFT shoulder musculature is concerning for a cutaneous metastasis. This nodule would be amenable to ultrasound guided sampling. 2. Enlarging nodule in the retroperitoneum anterior to the LEFT iliacus muscle has hypermetabolic activity and therefore concerning for a metastatic deposit. 3. Enlarging hypermetabolic nodule / consolidation in the periphery of the LEFT upper lobe is concerning for lung cancer recurrence.   Interventional Radiology has been asked to evaluate this patient for an image-guided left shoulder nodule biopsy. This case was reviewed and procedure approved by Dr. Earleen Newport.   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 These range in size from about 5 mm to 2 cm. They demonstrate irregular indistinct borders, the larger ones demonstrating spiculation. PET SCAN: Diffuse pulmonary metastatic disease with a 4 cm dominant left lower low lung lesion. No enlarged or hypermetabolic mediastinal or hilar adenopathy.   Allergy    Anxiety    Cancer ([place])    Change of skin related to chemotherapy 09/25/2017   Drug-induced skin rash 01/17/2016   Hyperlipidemia     Hypertension    Insomnia    Prediabetes    Thyroid disease     Past Surgical History:  Procedure Laterality Date   ABDOMINAL HYSTERECTOMY  1995   w BSO   TONSILLECTOMY AND ADENOIDECTOMY      Allergies: Penicillins  Medications: Prior to Admission medications   Medication Sig Start Date End Date Taking? Authorizing Provider  afatinib dimaleate (GILOTRIF) 30 MG tablet TAKE 1 TABLET (30MG ) BY MOUTH ONCE DAILY. TAKE ON AN EMPTY STOMACH 1 HOUR BEFORE OR 2 HOURS AFTER A MEAL Patient taking differently: Take 30 mg by mouth at bedtime. TAKE ON AN EMPTY STOMACH 1 HOUR BEFORE OR 2 HOURS AFTER A MEAL 04/18/21 04/18/22 Yes Curt Bears, MD  ALPRAZolam Duanne Moron) 1 MG tablet Take 1 mg by mouth at bedtime.   Yes [provider]  aspirin 81 MG tablet Take 81 mg by mouth at bedtime.    Yes [provider]  hydrochlorothiazide (HYDRODIURIL) 25 MG tablet Take 25 mg by mouth daily. 03/25/21  Yes [provider]  ibuprofen (ADVIL,MOTRIN) 200 MG tablet Take 400 mg by mouth every 6 (six) hours as needed for headache or moderate pain.   Yes [provider]  levothyroxine (SYNTHROID) 50 MCG tablet TAKE 1 TABLET BY MOUTH DAILY ON AN EMPTY STOMACH WITH ONLY WATER FOR 30 MINUTES. NO ANTACID, CALCIUM, OR MAGNESIUM FOR 4 HOURS. AVOID BIOTIN Patient taking differently: Take 50 mcg by mouth daily before breakfast. NO ANTACID, CALCIUM, OR MAGNESIUM FOR 4 HOURS. AVOID BIOTIN 12/30/20  Yes Liane Comber, NP  Melatonin 10 MG CAPS Take 10 mg by mouth at bedtime.   Yes [provider]  montelukast (SINGULAIR) 10 MG tablet TAKE 1 TABLET BY MOUTH DAILY FOR  ALLERGIES Patient taking differently: Take 10 mg by mouth daily. 07/03/20  Yes Liane Comber, NP  loratadine-pseudoephedrine (CLARITIN-D 12-HOUR) 5-120 MG tablet Take 1 tablet by mouth 2 (two) times daily as needed for allergies.    [provider]  Promethazine-Codeine 6.25-10 MG/5ML SOLN TAKE 5 TO 10 ML BY MOUTH  EVERY 4 HOURS AS NEEDED FOR COUGH OR CONGESTION Patient taking differently: Take 5-10 mLs by mouth every 4 (four) hours as needed (cough/congestion). 07/03/20   Liane Comber, NP     Family History  Problem Relation Age of Onset   Liver disease Mother    Alcohol abuse Mother    ALS Father    Hypertension Father    Dementia Maternal Grandmother 94   Lung disease Maternal Grandfather    Lung cancer Paternal Grandmother 65   Alcohol abuse Paternal Grandfather    Heart disease Paternal Grandfather    Melanoma Paternal Aunt    Bone cancer Maternal Uncle    Dementia Paternal Aunt 90   Breast cancer Cousin     Social History   Socioeconomic History   Marital status: Married    Spouse name: Not on file   Number of children: Not on file   Years of education: Not on file   Highest education level: Not on file  Occupational History   Not on file  Tobacco Use   Smoking status: Former    Packs/day: 0.50    Years: 10.00    Pack years: 5.00    Types: Cigarettes    Quit date: 10/23/1974    Years since quitting: 46.5   Smokeless tobacco: Never  Vaping Use   Vaping Use: Never used  Substance and Sexual Activity   Alcohol use: No    Comment: Rare   Drug use: No   Sexual activity: Not on file  Other Topics Concern   Not on file  Social History Narrative   Not on file   Social Determinants of Health   Financial Resource Strain: Not on file  Food Insecurity: Not on file  Transportation Needs: Not on file  Physical Activity: Not on file  Stress: Not on file  Social Connections: Not on file    Review of Systems: A 12 point ROS discussed and pertinent positives are indicated in the HPI above.  All other systems are negative.  Review of Systems  Constitutional:  Negative for appetite change and fatigue.  Respiratory:  Negative for cough and shortness of breath.   Cardiovascular:  Negative for chest pain and leg swelling.  Gastrointestinal:  Negative for abdominal pain,  diarrhea, nausea and vomiting.  Neurological:  Negative for dizziness and headaches.   Vital Signs: BP (!) 145/86 (BP Location: Right Arm)   Pulse 78   Temp 98.1 F (36.7 C) (Oral)   Resp 18   Ht 5' (1.524 m)   Wt 165 lb (74.8 kg)   SpO2 98%   BMI 32.22 kg/m   Physical Exam Constitutional:      General: She is not in acute distress.    Appearance: Normal appearance. She is not ill-appearing.  HENT:     Mouth/Throat:     Mouth: Mucous membranes are moist.     Pharynx: Oropharynx is clear.  Cardiovascular:     Rate and Rhythm: Normal rate and regular rhythm.     Pulses: Normal pulses.     Heart sounds: Normal heart sounds.  Pulmonary:     Effort: Pulmonary effort is normal.  Breath sounds: Normal breath sounds.  Abdominal:     General: Bowel sounds are normal.     Palpations: Abdomen is soft.  Musculoskeletal:     Right lower leg: No edema.     Left lower leg: No edema.  Skin:    General: Skin is warm and dry.  Neurological:     Mental Status: She is alert and oriented to person, place, and time.    Imaging: CT Chest W Contrast  Result Date: 04/04/2021 CLINICAL DATA:  Metastatic non-small cell lung cancer restaging EXAM: CT CHEST, ABDOMEN, AND PELVIS WITH CONTRAST TECHNIQUE: Multidetector CT imaging of the chest, abdomen and pelvis was performed following the standard protocol during bolus administration of intravenous contrast. CONTRAST:  141mL OMNIPAQUE IOHEXOL 300 MG/ML SOLN, additional oral enteric contrast COMPARISON:  CT abdomen pelvis, 01/20/2021, CT chest abdomen pelvis, 12/07/2020 FINDINGS: CT CHEST FINDINGS Cardiovascular: No significant vascular findings. Normal heart size. No pericardial effusion. Mediastinum/Nodes: No enlarged mediastinal, hilar, or axillary lymph nodes. Small hiatal hernia. Thyroid gland, trachea, and esophagus demonstrate no significant findings. Lungs/Pleura: Unchanged post treatment appearance of the perihilar and infrahilar left lung  with dense post treatment fibrosis, consolidation, and volume loss (series 7, image 70). Unchanged background of numerous irregular pulmonary nodules and opacities, largest again in the peripheral left upper lobe measuring 1.3 x 1.0 cm (series 7, image 40). No pleural effusion or pneumothorax. Musculoskeletal: No chest wall mass or suspicious bone lesions identified. CT ABDOMEN PELVIS FINDINGS Hepatobiliary: No solid liver abnormality is seen. Unchanged, benign hemangioma of the posterior liver dome (series 2, image 40). No gallstones, gallbladder wall thickening, or biliary dilatation. Pancreas: Unremarkable. No pancreatic ductal dilatation or surrounding inflammatory changes. Spleen: Normal in size without significant abnormality. Adrenals/Urinary Tract: Adrenal glands are unremarkable. Kidneys are normal, without renal calculi, solid lesion, or hydronephrosis. Bladder is unremarkable. Stomach/Bowel: Stomach is within normal limits. Appendix appears normal. No evidence of bowel wall thickening, distention, or inflammatory changes. Sigmoid diverticulosis. Vascular/Lymphatic: Aortic atherosclerosis. No enlarged abdominal or pelvic lymph nodes. Reproductive: Status post hysterectomy. Other: No abdominal wall hernia or abnormality. No abdominopelvic ascites. Slight interval enlargement of a soft tissue nodule in the left retroperitoneal fat overlying the iliacus muscle, measuring 1.0 x 0.8 cm (series 2, image 84). Musculoskeletal: No acute or significant osseous findings. IMPRESSION: 1. Unchanged post treatment appearance of the perihilar and infrahilar left lung with dense post treatment fibrosis, consolidation, and volume loss. 2. Unchanged background of numerous irregular pulmonary nodules and opacities, largest again in the peripheral left upper lobe measuring 1.3 x 1.0 cm, consistent with treated metastatic disease. 3. Slight interval enlargement of a soft tissue nodule in the left retroperitoneal fat overlying  the iliacus muscle, measuring 1.0 x 0.8 cm. Continued growth remains highly concerning for metastatic disease, although a solitary, isolated soft tissue metastasis is somewhat unusual. This could be characterized for metabolic activity by FDG PET-CT given size and location. Alternately, continued attention on follow-up. Aortic Atherosclerosis (ICD10-I70.0). Electronically Signed   By: Eddie Candle M.D.   On: 04/04/2021 15:00   CT Abdomen Pelvis W Contrast  Result Date: 04/04/2021 CLINICAL DATA:  Metastatic non-small cell lung cancer restaging EXAM: CT CHEST, ABDOMEN, AND PELVIS WITH CONTRAST TECHNIQUE: Multidetector CT imaging of the chest, abdomen and pelvis was performed following the standard protocol during bolus administration of intravenous contrast. CONTRAST:  128mL OMNIPAQUE IOHEXOL 300 MG/ML SOLN, additional oral enteric contrast COMPARISON:  CT abdomen pelvis, 01/20/2021, CT chest abdomen pelvis, 12/07/2020 FINDINGS: CT CHEST FINDINGS  Cardiovascular: No significant vascular findings. Normal heart size. No pericardial effusion. Mediastinum/Nodes: No enlarged mediastinal, hilar, or axillary lymph nodes. Small hiatal hernia. Thyroid gland, trachea, and esophagus demonstrate no significant findings. Lungs/Pleura: Unchanged post treatment appearance of the perihilar and infrahilar left lung with dense post treatment fibrosis, consolidation, and volume loss (series 7, image 70). Unchanged background of numerous irregular pulmonary nodules and opacities, largest again in the peripheral left upper lobe measuring 1.3 x 1.0 cm (series 7, image 40). No pleural effusion or pneumothorax. Musculoskeletal: No chest wall mass or suspicious bone lesions identified. CT ABDOMEN PELVIS FINDINGS Hepatobiliary: No solid liver abnormality is seen. Unchanged, benign hemangioma of the posterior liver dome (series 2, image 40). No gallstones, gallbladder wall thickening, or biliary dilatation. Pancreas: Unremarkable. No  pancreatic ductal dilatation or surrounding inflammatory changes. Spleen: Normal in size without significant abnormality. Adrenals/Urinary Tract: Adrenal glands are unremarkable. Kidneys are normal, without renal calculi, solid lesion, or hydronephrosis. Bladder is unremarkable. Stomach/Bowel: Stomach is within normal limits. Appendix appears normal. No evidence of bowel wall thickening, distention, or inflammatory changes. Sigmoid diverticulosis. Vascular/Lymphatic: Aortic atherosclerosis. No enlarged abdominal or pelvic lymph nodes. Reproductive: Status post hysterectomy. Other: No abdominal wall hernia or abnormality. No abdominopelvic ascites. Slight interval enlargement of a soft tissue nodule in the left retroperitoneal fat overlying the iliacus muscle, measuring 1.0 x 0.8 cm (series 2, image 84). Musculoskeletal: No acute or significant osseous findings. IMPRESSION: 1. Unchanged post treatment appearance of the perihilar and infrahilar left lung with dense post treatment fibrosis, consolidation, and volume loss. 2. Unchanged background of numerous irregular pulmonary nodules and opacities, largest again in the peripheral left upper lobe measuring 1.3 x 1.0 cm, consistent with treated metastatic disease. 3. Slight interval enlargement of a soft tissue nodule in the left retroperitoneal fat overlying the iliacus muscle, measuring 1.0 x 0.8 cm. Continued growth remains highly concerning for metastatic disease, although a solitary, isolated soft tissue metastasis is somewhat unusual. This could be characterized for metabolic activity by FDG PET-CT given size and location. Alternately, continued attention on follow-up. Aortic Atherosclerosis (ICD10-I70.0). Electronically Signed   By: Eddie Candle M.D.   On: 04/04/2021 15:00   NM PET Image Restage (PS) Skull Base to Thigh  Result Date: 04/24/2021 CLINICAL DATA:  Subsequent treatment strategy for non-small cell lung cancer staging. Chemotherapy 2020 EXAM: NUCLEAR  MEDICINE PET SKULL BASE TO THIGH TECHNIQUE: 8.3 mCi F-18 FDG was injected intravenously. Full-ring PET imaging was performed from the skull base to thigh after the radiotracer. CT data was obtained and used for attenuation correction and anatomic localization. Fasting blood glucose: 3 88 mg/dl COMPARISON:  CT 04/04/2021 FINDINGS: Mediastinal blood pool activity: SUV max 2.9 Liver activity: SUV max NA NECK: No hypermetabolic lymph nodes in the neck. Incidental CT findings: none CHEST: Anterior to the LEFT shoulder musculature there is a small hypermetabolic subcutaneous nodule measuring 12 mm (image 35) with SUV max equal 8.9. Triangular focus of peripheral consolidation in the LEFT upper lobe measuring 17 mm x 11 mm (image 24/8) has associated metabolic activity SUV max equal 4.6. Nodule increased in size from 8 mm 9 mm on CT 11/18/2019. Consolidation in the superior aspect of the LEFT lower lobe measuring 3.1 cm however does not have significant metabolic activity (SUV max equal 2.8). No additional hypermetabolic nodules in the LEFT RIGHT lung. No hypermetabolic mediastinal lymph nodes. Incidental CT findings: none ABDOMEN/PELVIS: The retroperitoneal nodule of concern anterior to the LEFT iliacus muscle measures 9 mm (image 135) and  does have associated metabolic activity with SUV max equal 6.0. No additional hypermetabolic foci within the abdomen pelvis. No hypermetabolic adenopathy. Liver normal. Incidental CT findings: none SKELETON: No focal hypermetabolic activity to suggest skeletal metastasis. Incidental CT findings: none IMPRESSION: 1. Hypermetabolic subcutaneous nodule anterior to the LEFT shoulder musculature is concerning for a cutaneous metastasis. This nodule would be amenable to ultrasound guided sampling. 2. Enlarging nodule in the retroperitoneum anterior to the LEFT iliacus muscle has hypermetabolic activity and therefore concerning for a metastatic deposit. 3. Enlarging hypermetabolic nodule /  consolidation in the periphery of the LEFT upper lobe is concerning for lung cancer recurrence. Electronically Signed   By: Suzy Bouchard M.D.   On: 04/24/2021 13:08    Labs:  CBC: Recent Labs    12/07/20 0841 01/20/21 0913 04/04/21 0955 05/02/21 0944  WBC 5.5 6.0 5.9 6.2  HGB 12.5 12.6 12.9 12.5  HCT 38.3 39.1 37.8 37.7  PLT 179 194 187 192    COAGS: Recent Labs    05/02/21 0944  INR 1.0    BMP: Recent Labs    06/04/20 0910 08/06/20 0915 11/18/20 1057 12/07/20 0841 01/20/21 0913 04/04/21 0955  NA 140   < > 142 142 142 139  K 4.4   < > 4.6 4.1 4.2 4.1  CL 106   < > 105 106 106 101  CO2 23   < > 28 27 26 28   GLUCOSE 92   < > 83 98 89 91  BUN 17   < > 14 12 15 14   CALCIUM 9.6   < > 9.1 8.9 8.5* 9.2  CREATININE 1.02*   < > 0.91 1.11* 0.94 0.93  GFRNONAA 55*   < > 63 52* >60 >60  GFRAA >60  --  73  --   --   --    < > = values in this interval not displayed.    LIVER FUNCTION TESTS: Recent Labs    10/07/20 1042 11/18/20 1057 12/07/20 0841 01/20/21 0913 04/04/21 0955  BILITOT 1.1 0.8 0.9 1.0 0.7  AST 21 23 28 25 26   ALT 17 17 19 17 16   ALKPHOS 62  --  62 62 66  PROT 6.6 6.6 6.8 6.8 7.1  ALBUMIN 3.4*  --  3.6 3.6 3.7    TUMOR MARKERS: No results for input(s): AFPTM, CEA, CA199, CHROMGRNA in the last 8760 hours.  Assessment and Plan:  Left lung cancer with possible disease progression; left shoulder nodule: Elizabeth [name], 75 year old female, presents today to the [facility] department for an image-guided left shoulder nodule biopsy.   Risks and benefits of this procedure were discussed with the patient and/or patient's family including, but not limited to bleeding, infection, damage to adjacent structures or low yield requiring additional tests.  All of the questions were answered and there is agreement to proceed. She has been NPO. Labs and vitals have been reviewed.   Consent signed and in chart.  Thank you for this  interesting consult.  I greatly enjoyed meeting CECLIA KOKER and look forward to participating in their care.  A copy of this report was sent to the requesting provider on this date.  Electronically Signed: Soyla Dryer, AGACNP-BC [phone] 05/02/2021, 11:07 AM   I spent a total of  30 Minutes   in face to face in clinical consultation, greater than 50% of which was counseling/coordinating care for left shoulder nodule biopsy

## 2021-05-02 NOTE — Procedures (Signed)
Interventional Radiology Procedure Note  Procedure: US guided biopsy of left shoulder superficial nodule, FDG avid on prior PET.  CT and [place] used for correlation today. Mx 18g core biopsy.   Complications: None EBL: None Recommendations:  - Bedrest 1 hours.   - Routine care - Follow up pathology - Advance diet   Signed,  Corrie Mckusick, DO

## 2021-05-03 NOTE — Progress Notes (Signed)
Location of tumor and Histology per Pathology Report: left lung, retroperitoneum, left shoulder subcutaneous nodule  Biopsy: 04/01/2015   Past/Anticipated interventions by surgeon, if any: 04/01/2015   Past/Anticipated interventions by medical oncology, if any: Dr Julien Nordmann     Pain issues, if any:  no   SAFETY ISSUES: Prior radiation? yes, left lung 12/11/2019-12/18/2019 Pacemaker/ICD? no Possible current pregnancy?no Is the patient on methotrexate? no  Current Complaints / other details:  none     Vitals:   05/05/21 1216  BP: 127/75  Pulse: 73  Resp: 18  Temp: (!) 97.3 F (36.3 C)  SpO2: 99%  Weight: 167 lb 12.8 oz (76.1 kg)  Height: 5' (1.524 m)

## 2021-05-04 ENCOUNTER — Encounter: Payer: Self-pay | Admitting: Radiation Oncology

## 2021-05-04 NOTE — Progress Notes (Signed)
Radiation Oncology         [phone] ________________________________  Outpatient Re-Consultation  Name: Elizabeth [name] MRN: [id number]  Date: 05/05/2021  DOB: 03-09-1946  CC:[name], MD  Curt Bears, MD   REFERRING PHYSICIAN: Curt Bears, MD  DIAGNOSIS: The encounter diagnosis was Adenocarcinoma of left lung, stage 4 ([place]).  Stage IV (T2a, N0, M1a) non-small cell lung cancer, adenocarcinoma with positive EGFR mutation with deletion in exon 19 diagnosed in June 2016 and presented with large mass in the left lower lobe in addition to multiple bilateral pulmonary nodules, now with oligometastasis  Interval Since Last Radiation:  1 year, 4 months, and 19 days ago Radiation Treatment Dates: 12/11/2019 through 12/18/2019 Site Technique Total Dose (Gy) Dose per Fx (Gy) Completed Fx Beam Energies  Lung, Left: Lung_Lt IMRT 54/54 18 3/3 6XFFF    HISTORY OF PRESENT ILLNESS::Elizabeth [name] is a 75 y.o. female who is accompanied by no one. she is seen as a courtesy of Dr. Julien Nordmann for an opinion concerning radiation therapy as part of management for her recently diagnosed nodules in the left lung as well as the retroperitoneum; suspicious for disease progression. The patient was last seen here for follow up in march of 2021 following her radiation treatment. She tolerated this treatment well and continued with consistent follow-up's with Dr. Julien Nordmann concerning her multiple ground-glass pulmonary nodules.   The patient presented for a routine follow up CT of the chest abdomen and pelvis on 12/07/20 which first showed significant interval enlargement of one of the nodules in the left retroperitoneal fat overlying the iliopsoas muscle, measuring 8 mm, Previously measuring  3 mm. This was notably suspicious for soft tissue metastatic disease.  CT of the chest abdomen and pelvis taken on 01/20/21 showed the nodule last measuring 8 mm to measure 9 x 7 mm; showing further concern for  soft tissue metastasis. No other sites of metastatic disease were noted.   Follow-up chest CT taken on 04/04/21 showed again slight interval enlargement of the soft tissue nodule in the left retroperitoneal fat overlying the iliacus muscle, measuring 1.0 x 0.8 cm. Otherwise, the numerous other pulmonary nodules remained unchanged (largest of which has been a consistent peripheral left upper lobe nodule measuring 1.3 x 1.0 cm.   PET scan on 04/22/21 showed a hypermetabolic subcutaneous nodule anterior to the left shoulder musculature concerning for a cutaneous metastasis. Again seen was the enlarging nodule overlaying the iliacus muscle previously seen, this time noted to have hypermetabolic activity.   Also seen was an additional enlarging hypermetabolic nodule/consolidation in the periphery of the left upper lobe; concerning for lung cancer recurrence.   Subsequent findings from imaging on 04/22/21 prompted Dr. Julien Nordmann to refer the patient to undergo a CT guided biopsy of the  left shoulder nodule on 05/02/21 under Soyla Dryer NP ([facility]).  Results from this biopsy revealed atypical lymphoid infiltrate.  Overall the changes were atypical and worrisome for B-cell lymphoproliferative process such as follicular lymphoma.  PREVIOUS RADIATION THERAPY:   PAST MEDICAL HISTORY:  Past Medical History:  Diagnosis Date   Adenocarcinoma of left lung, stage 4 ([place]) 04/05/2015   Biopsy confirmed CT SCAN: There are innumerable bilateral pulmonary nodules. These range in size from about 5 mm to 2 cm. They demonstrate irregular indistinct borders, the larger ones demonstrating spiculation. PET SCAN: Diffuse pulmonary metastatic disease with a 4 cm dominant left lower low lung lesion. No enlarged or hypermetabolic mediastinal or hilar adenopathy.  Allergy    Anxiety    Cancer ([place])    Change of skin related to chemotherapy 09/25/2017   Drug-induced skin rash  01/17/2016   History of radiation therapy 12/18/2019   SBRT left lung  12/11/2019-12/18/2019   Dr Gery Pray   Hyperlipidemia    Hypertension    Insomnia    Prediabetes    Thyroid disease     PAST SURGICAL HISTORY: Past Surgical History:  Procedure Laterality Date   ABDOMINAL HYSTERECTOMY  1995   w BSO   TONSILLECTOMY AND ADENOIDECTOMY      FAMILY HISTORY:  Family History  Problem Relation Age of Onset   Liver disease Mother    Alcohol abuse Mother    ALS Father    Hypertension Father    Dementia Maternal Grandmother 94   Lung disease Maternal Grandfather    Lung cancer Paternal Grandmother 46   Alcohol abuse Paternal Grandfather    Heart disease Paternal Grandfather    Melanoma Paternal Aunt    Bone cancer Maternal Uncle    Dementia Paternal Aunt 90   Breast cancer Cousin     SOCIAL HISTORY:  Social History   Tobacco Use   Smoking status: Former    Packs/day: 0.50    Years: 10.00    Pack years: 5.00    Types: Cigarettes    Quit date: 10/23/1974    Years since quitting: 46.5   Smokeless tobacco: Never  Vaping Use   Vaping Use: Never used  Substance Use Topics   Alcohol use: No    Comment: Rare   Drug use: No    ALLERGIES:  Allergies  Allergen Reactions   Penicillins Swelling and Rash    MEDICATIONS:  Current Outpatient Medications  Medication Sig Dispense Refill   afatinib dimaleate (GILOTRIF) 30 MG tablet TAKE 1 TABLET (30MG) BY MOUTH ONCE DAILY. TAKE ON AN EMPTY STOMACH 1 HOUR BEFORE OR 2 HOURS AFTER A MEAL (Patient taking differently: Take 30 mg by mouth at bedtime. TAKE ON AN EMPTY STOMACH 1 HOUR BEFORE OR 2 HOURS AFTER A MEAL) 30 tablet 2   ALPRAZolam (XANAX) 1 MG tablet Take 1 mg by mouth at bedtime.     aspirin 81 MG tablet Take 81 mg by mouth at bedtime.      hydrochlorothiazide (HYDRODIURIL) 25 MG tablet Take 25 mg by mouth daily.     ibuprofen (ADVIL,MOTRIN) 200 MG tablet Take 400 mg by mouth every 6 (six) hours as needed for headache or  moderate pain.     levothyroxine (SYNTHROID) 50 MCG tablet TAKE 1 TABLET BY MOUTH DAILY ON AN EMPTY STOMACH WITH ONLY WATER FOR 30 MINUTES. NO ANTACID, CALCIUM, OR MAGNESIUM FOR 4 HOURS. AVOID BIOTIN (Patient taking differently: Take 50 mcg by mouth daily before breakfast. NO ANTACID, CALCIUM, OR MAGNESIUM FOR 4 HOURS. AVOID BIOTIN) 90 tablet 3   loratadine-pseudoephedrine (CLARITIN-D 12-HOUR) 5-120 MG tablet Take 1 tablet by mouth 2 (two) times daily as needed for allergies.     Melatonin 10 MG CAPS Take 10 mg by mouth at bedtime.     montelukast (SINGULAIR) 10 MG tablet TAKE 1 TABLET BY MOUTH DAILY FOR ALLERGIES (Patient taking differently: Take 10 mg by mouth daily.) 90 tablet 3   Promethazine-Codeine 6.25-10 MG/5ML SOLN TAKE 5 TO 10 ML BY MOUTH EVERY 4 HOURS AS NEEDED FOR COUGH OR CONGESTION (Patient taking differently: Take 5-10 mLs by mouth every 4 (four) hours as needed (cough/congestion).) 360 mL 0   No current  facility-administered medications for this encounter.    REVIEW OF SYSTEMS:  A 10+ POINT REVIEW OF SYSTEMS WAS OBTAINED including neurology, dermatology, psychiatry, cardiac, respiratory, lymph, extremities, GI, GU, musculoskeletal, constitutional, reproductive, HEENT.  she overall is feeling well.  she denies any areas of pain.  Her energy level is good.  She denies any significant cough or breathing problems.  She denies any pain within the chest area.   PHYSICAL EXAM:  height is 5' (1.524 m) and weight is 167 lb 12.8 oz (76.1 kg). Her temperature is 97.3 F (36.3 C) (abnormal). Her blood pressure is 127/75 and her pulse is 73. Her respiration is 18 and oxygen saturation is 99%.   General: Alert and oriented, in no acute distress HEENT: Head is normocephalic. Extraocular movements are intact.  Neck: Neck is supple, no palpable cervical or supraclavicular lymphadenopathy. Heart: Regular in rate and rhythm with no murmurs, rubs, or gallops. Chest: Clear to auscultation bilaterally,  with no rhonchi, wheezes, or rales.  Recent biopsy site present along the left anterior shoulder.  No palpable mass deep to this biopsy site. Abdomen: Soft, nontender, nondistended, with no rigidity or guarding. Extremities: No cyanosis or edema. Lymphatics: see Neck Exam Skin: No concerning lesions. Musculoskeletal: symmetric strength and muscle tone throughout. Neurologic: Cranial nerves II through XII are grossly intact. No obvious focalities. Speech is fluent. Coordination is intact. Psychiatric: Judgment and insight are intact. Affect is appropriate.   ECOG = 0  0 - Asymptomatic (Fully active, able to carry on all predisease activities without restriction)  1 - Symptomatic but completely ambulatory (Restricted in physically strenuous activity but ambulatory and able to carry out work of a light or sedentary nature. For example, light housework, office work)  2 - Symptomatic, <50% in bed during the day (Ambulatory and capable of all self care but unable to carry out any work activities. Up and about more than 50% of waking hours)  3 - Symptomatic, >50% in bed, but not bedbound (Capable of only limited self-care, confined to bed or chair 50% or more of waking hours)  4 - Bedbound (Completely disabled. Cannot carry on any self-care. Totally confined to bed or chair)  5 - Death   Eustace Pen MM, Creech RH, Tormey DC, et al. [phone]). "Toxicity and response criteria of the [facility]". [place] Oncol. 5 (6): 649-55  LABORATORY DATA:  Lab Results  Component Value Date   WBC 6.2 05/02/2021   HGB 12.5 05/02/2021   HCT 37.7 05/02/2021   MCV 86.3 05/02/2021   PLT 192 05/02/2021   NEUTROABS 3.9 04/04/2021   Lab Results  Component Value Date   NA 139 04/04/2021   K 4.1 04/04/2021   CL 101 04/04/2021   CO2 28 04/04/2021   GLUCOSE 91 04/04/2021   CREATININE 0.93 04/04/2021   CALCIUM 9.2 04/04/2021      RADIOGRAPHY: NM PET Image Restage (PS) Skull Base to  Thigh  Result Date: 04/24/2021 CLINICAL DATA:  Subsequent treatment strategy for non-small cell lung cancer staging. Chemotherapy 2020 EXAM: NUCLEAR MEDICINE PET SKULL BASE TO THIGH TECHNIQUE: 8.3 mCi F-18 FDG was injected intravenously. Full-ring PET imaging was performed from the skull base to thigh after the radiotracer. CT data was obtained and used for attenuation correction and anatomic localization. Fasting blood glucose: 3 88 mg/dl COMPARISON:  CT 04/04/2021 FINDINGS: Mediastinal blood pool activity: SUV max 2.9 Liver activity: SUV max NA NECK: No hypermetabolic lymph nodes in the neck. Incidental CT findings: none  CHEST: Anterior to the LEFT shoulder musculature there is a small hypermetabolic subcutaneous nodule measuring 12 mm (image 35) with SUV max equal 8.9. Triangular focus of peripheral consolidation in the LEFT upper lobe measuring 17 mm x 11 mm (image 24/8) has associated metabolic activity SUV max equal 4.6. Nodule increased in size from 8 mm 9 mm on CT 11/18/2019. Consolidation in the superior aspect of the LEFT lower lobe measuring 3.1 cm however does not have significant metabolic activity (SUV max equal 2.8). No additional hypermetabolic nodules in the LEFT RIGHT lung. No hypermetabolic mediastinal lymph nodes. Incidental CT findings: none ABDOMEN/PELVIS: The retroperitoneal nodule of concern anterior to the LEFT iliacus muscle measures 9 mm (image 135) and does have associated metabolic activity with SUV max equal 6.0. No additional hypermetabolic foci within the abdomen pelvis. No hypermetabolic adenopathy. Liver normal. Incidental CT findings: none SKELETON: No focal hypermetabolic activity to suggest skeletal metastasis. Incidental CT findings: none IMPRESSION: 1. Hypermetabolic subcutaneous nodule anterior to the LEFT shoulder musculature is concerning for a cutaneous metastasis. This nodule would be amenable to ultrasound guided sampling. 2. Enlarging nodule in the retroperitoneum  anterior to the LEFT iliacus muscle has hypermetabolic activity and therefore concerning for a metastatic deposit. 3. Enlarging hypermetabolic nodule / consolidation in the periphery of the LEFT upper lobe is concerning for lung cancer recurrence. Electronically Signed   By: Suzy Bouchard M.D.   On: 04/24/2021 13:08   CT BIOPSY  Result Date: 05/02/2021 INDICATION: 75 year old female with a history FDG avid soft tissue nodule of the left upper extremity overlying the shoulder. History of non-small cell carcinoma of the chest referred for biopsy EXAM: CT BIOPSY MEDICATIONS: None. ANESTHESIA/SEDATION: Moderate (conscious) sedation was employed during this procedure. A total of Versed 2.0 mg and Fentanyl 100 mcg was administered intravenously. Moderate Sedation Time: 15 minutes. The patient's level of consciousness and vital signs were monitored continuously by radiology nursing throughout the procedure under my direct supervision. FLUOROSCOPY TIME:  CT COMPLICATIONS: None PROCEDURE: Informed written consent was obtained from the patient after a thorough discussion of the procedural risks, benefits and alternatives. All questions were addressed. Maximal Sterile Barrier Technique was utilized including caps, mask, sterile gowns, sterile gloves, sterile drape, hand hygiene and skin antiseptic. A timeout was performed prior to the initiation of the procedure. Patient positioned supine position on CT gantry table. Scout CT of the left shoulder was performed for planning purposes. Once the target nodule was identified on the CT, ultrasound was performed, confirming the lesion on ultrasound images. The patient was then prepped and draped in the usual sterile fashion. A sterile gown and sterile gloves were used for the procedure. Local anesthesia was provided with 1% Lidocaine. Ultrasound guidance was used to infiltrate the region with 1% lidocaine for local anesthesia. Multiple 18 gauge core biopsy were then acquired  of the soft tissue nodule using ultrasound guidance. Images were stored. Final image was stored after biopsy. Patient tolerated the procedure well and remained hemodynamically stable throughout. No complications were encountered and no significant blood loss was encounter IMPRESSION: Status post image guided biopsy of superficial soft tissue nodule overlying the left shoulder musculature, with CT imaging for correlation and ultrasound-guided biopsy. Signed, Dulcy Fanny. Dellia Nims, RPVI [facility] Electronically Signed   By: Corrie Mckusick D.O.   On: 05/02/2021 14:59      IMPRESSION: Stage IV (T2a, N0, M1a) non-small cell lung cancer, adenocarcinoma with positive EGFR mutation with deletion in exon  10 diagnosed in June 2016 and presented with large mass in the left lower lobe in addition to multiple bilateral pulmonary nodules, now with oligometastasis  I reviewed the most recent PET CT scan with the patient.  We reviewed the areas of increased uptake consistent with malignancy.  We also reviewed pathologic results from the recent biopsy of the left anterior shoulder cutaneous subcutaneous lesion.  We discussed that this is suspicious for follicular lymphoma.  I reviewed the patient's chest CT scans in detail and the area within the chest was more consistent with lung lesion.  We discussed that she would be a candidate for SBRT directed at this lesion along the left lateral chest.  This is in a separate area from her previous SBRT.  Patient may also have a potential second malignancy such as follicular lymphoma involving the left pelvis and left anterior shoulder region.  This lesion in the left pelvis however could potentially be spread from her lung cancer too.  Prior to considering SBRT for the patient's lung lesion and potentially her left the pelvis lesion would like to  discuss management with Dr. Julien Nordmann.  The lesion along the left anterior chest  potentially could undergo excisional biopsy for additional tissue but it appears to be deep on the CT imaging.    PLAN: Discussion with medical oncology prior to initiation of any treatment.   35 minutes of total time was spent for this patient encounter, including preparation, face-to-face counseling with the patient and coordination of care, physical exam, and documentation of the encounter.   ------------------------------------------------  Blair Promise, PhD, MD  This document serves as a record of services personally performed by Gery Pray, MD. It was created on his behalf by Roney Mans, a trained medical scribe. The creation of this record is based on the scribe's personal observations and the provider's statements to them. This document has been checked and approved by the attending provider.

## 2021-05-05 ENCOUNTER — Ambulatory Visit
Admission: RE | Admit: 2021-05-05 | Discharge: 2021-05-05 | Disposition: A | Discharge status: Home / Self Care | Payer: Medicare Other | Source: Ambulatory Visit | Attending: Radiation Oncology | Admitting: Radiation Oncology

## 2021-05-05 ENCOUNTER — Encounter: Payer: Self-pay | Admitting: Radiation Oncology

## 2021-05-05 ENCOUNTER — Other Ambulatory Visit: Payer: Self-pay

## 2021-05-05 VITALS — BP 127/75 | HR 73 | Temp 97.3°F | Resp 18 | Ht 60.0 in | Wt 167.8 lb

## 2021-05-05 VITALS — BP 127/75 | HR 73 | Temp 97.3°F | Resp 18 | Wt 167.8 lb

## 2021-05-05 DIAGNOSIS — C3432 Malignant neoplasm of lower lobe, left bronchus or lung: Secondary | ICD-10-CM | POA: Insufficient documentation

## 2021-05-05 DIAGNOSIS — Z87891 Personal history of nicotine dependence: Secondary | ICD-10-CM | POA: Insufficient documentation

## 2021-05-05 DIAGNOSIS — Z923 Personal history of irradiation: Secondary | ICD-10-CM | POA: Diagnosis not present

## 2021-05-05 DIAGNOSIS — C7802 Secondary malignant neoplasm of left lung: Secondary | ICD-10-CM | POA: Insufficient documentation

## 2021-05-05 DIAGNOSIS — C3492 Malignant neoplasm of unspecified part of left bronchus or lung: Secondary | ICD-10-CM

## 2021-05-05 LAB — SURGICAL PATHOLOGY

## 2021-05-05 NOTE — Progress Notes (Signed)
See MD note for nursing evaluation. °

## 2021-05-09 ENCOUNTER — Other Ambulatory Visit: Payer: Self-pay | Admitting: Physician Assistant

## 2021-05-09 DIAGNOSIS — R1900 Intra-abdominal and pelvic swelling, mass and lump, unspecified site: Secondary | ICD-10-CM

## 2021-05-16 DIAGNOSIS — E669 Obesity, unspecified: Secondary | ICD-10-CM | POA: Insufficient documentation

## 2021-05-16 NOTE — Progress Notes (Signed)
MEDICARE ANNUAL WELLNESS AND FOLLOW UP  Assessment and Plan:   Annual Medicare Wellness Visit Due annually  Health maintenance reviewed  Atherosclerosis of aorta ([place]) 10/2019 Control blood pressure, cholesterol, glucose, increase exercise.  Discussed LDL goal <100  Essential hypertension Continue current medications Monitor blood pressure at home; call if consistently over 130/80 Continue DASH diet.   Reminder to go to the ER if any CP, SOB, nausea, dizziness, severe HA, changes vision/speech, left arm numbness and tingling and jaw pain. -     CBC with Differential/Platelet -     COMPLETE METABOLIC PANEL WITH GFR  Hyperlipidemia, mixed Has been off of statin secondary to cancer/chemo Discussed and she would prefer to start a gentle agent - zetia sent in with information. If no aggressive chemo with new lesions could transition to statin if needed. Follow up 3 months. Discussed dietary and exercise modifications Low fat diet -     Lipid panel  Acquired hypothyroidism Taking levothyroxine  Reminder to take on an empty stomach 30-66mins before first meal of the day. No antacid medications for 4 hours. 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 Earlie Server  Vitamin D deficiency Continue supplementation to maintain goal of 60-100 Taking Vitamin D 10,000 IU three times a week.  Abnormal glucose Discussed dietary and exercise modifications  Anxiety Well managed by current regimen; continue medications Stress management techniques discussed, increase water, good sleep hygiene discussed, increase exercise, and increase veggies.   Insomnia, unspecified type Discussed good sleep hygiene, decrease stimulation prior to sleep Increase day time activity Avoid caffeine in evenings Uses alprazolam PRN after failing trazodone, gabapentin; understands risks associated with this med  and wishes to continue Getting through Dr. Simonne Maffucci -   Obesity (BMI 30-34.9) Discussed dietary and exercise modifications Weight loss encouraged  Medication management Continued  Orders Placed This Encounter  Procedures   DG Bone Density   CBC with Differential/Platelet   COMPLETE METABOLIC PANEL WITH GFR   Magnesium   Lipid panel   TSH      Over 54min of face to face interview, exam, chart review, counseling preformed this office visit with moderate decision making.  Discussed med's effects and SE's. Screening labs and tests as requested with regular follow-up as recommended. Future Appointments  Date Time Provider [place]  05/25/2021  8:30 AM [facility] None  05/25/2021  9:00 AM Curt Bears, MD [facility] None  08/23/2021 11:30 AM Liane Comber, NP [facility] None  11/29/2021  2:00 PM [name], MD [facility] None  05/18/2022 11:00 AM Liane Comber, NP [facility] None     Plan:   During the course of the visit the patient was educated and counseled about appropriate screening and preventive services including:   Pneumococcal vaccine  Prevnar 13 Influenza vaccine Td vaccine Screening electrocardiogram Bone densitometry screening Colorectal cancer screening Diabetes screening Glaucoma screening Nutrition counseling  Advanced directives: requested   HPI  75 y.o. female  presents for annual medicare wellness and 6 month follow up. She has Abnormal glucose; Anxiety; Insomnia; Vitamin D deficiency; Hypothyroidism; Essential hypertension; Hyperlipidemia, mixed; Medication management; Adenocarcinoma of left lung, stage 4 ([place]); Encounter for antineoplastic chemotherapy; Aortic atherosclerosis ([place]) by Abd CT scan on 01/26/2-021; Labile hypertension; Obesity (BMI 30.0-34.9); and Osteopenia on their problem list.  She has history of stage 4 lung cancer, on Glotinef maintenance therapy and following with Dr. Julien Nordmann.  Recent PET  scan showed recurrent tumor with concern for mets. She had biopsy of nodule and referred to Dr. Sondra Come for consideration of SBRT to these 3 suspicious nodules and she will continue her current treatment with Gilotrif. 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 Earlie Server, has follow up 05/25/2021.  She has anxiety/insomnia, currently prescribed xanax 1 mg by another office, tried gabapentin, trazodone without success. Now getting via Dr. Kate Sable at another office.   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   BMI is Body mass index is 32.22 kg/m., she has not been working on diet and exercise, going to chiropractor to help with back, has new puppy and training to walk with her.  Wt Readings from Last 3 Encounters:  05/18/21 165 lb (74.8 kg)  05/05/21 167 lb 12.8 oz (76.1 kg)  05/05/21 167 lb 12.8 oz (76.1 kg)   Her blood pressure has been controlled at home, today their BP is BP: 126/76.   She does not workout. She denies chest pain, dizziness. She has exertional dyspnea, unchanged. Worse with humidity. Denies wheezing/coughing.   She has aortic atherosclerosis per CT 10/2019.   She is not on cholesterol medication (stopped statin several years back with cancer diagnosis) and denies myalgias. Her cholesterol is not at goal. The cholesterol last visit was:  Lab Results  Component Value Date   CHOL 195 11/18/2020   HDL 52 11/18/2020   LDLCALC 124 (H) 11/18/2020   TRIG 86 11/18/2020   CHOLHDL 3.8 11/18/2020    Last A1C in the office was:  Lab Results  Component Value Date   HGBA1C 5.6 11/18/2020   Patient is on Vitamin D supplement.   Lab Results  Component Value Date   VD25OH 74 11/18/2020     She is on thyroid medication. Her medication was not changed last visit.She is not on biotin. Takes 50 mcg daily.  Lab Results  Component  Value Date   TSH 3.97 11/18/2020  .   Current Medications:  Current Outpatient Medications on File Prior to Visit  Medication Sig Dispense Refill   afatinib dimaleate (GILOTRIF) 30 MG tablet TAKE 1 TABLET (30MG ) BY MOUTH ONCE DAILY. TAKE ON AN EMPTY STOMACH 1 HOUR BEFORE OR 2 HOURS AFTER A MEAL (Patient taking differently: Take 30 mg by mouth at bedtime. TAKE ON AN EMPTY STOMACH 1 HOUR BEFORE OR 2 HOURS AFTER A MEAL) 30 tablet 2   ALPRAZolam (XANAX) 1 MG tablet Take 1 mg by mouth at bedtime.     aspirin 81 MG tablet Take 81 mg by mouth at bedtime.      hydrochlorothiazide (HYDRODIURIL) 25 MG tablet Take 25 mg by mouth daily.     ibuprofen (ADVIL,MOTRIN) 200 MG tablet Take 400 mg by mouth every 6 (six) hours as needed for headache or moderate pain.     levothyroxine (SYNTHROID) 50 MCG tablet TAKE 1 TABLET BY MOUTH DAILY ON AN EMPTY STOMACH WITH ONLY WATER FOR 30 MINUTES. NO ANTACID, CALCIUM, OR MAGNESIUM FOR 4 HOURS. AVOID BIOTIN (Patient taking differently: Take 50 mcg by mouth daily before breakfast. NO ANTACID, CALCIUM, OR MAGNESIUM FOR 4 HOURS. AVOID BIOTIN) 90 tablet 3   loratadine-pseudoephedrine (CLARITIN-D 12-HOUR) 5-120 MG tablet Take 1 tablet by mouth 2 (two) times daily as needed for allergies.     Melatonin 10 MG CAPS Take 10 mg  by mouth at bedtime.     montelukast (SINGULAIR) 10 MG tablet TAKE 1 TABLET BY MOUTH DAILY FOR ALLERGIES (Patient taking differently: Take 10 mg by mouth daily.) 90 tablet 3   Promethazine-Codeine 6.25-10 MG/5ML SOLN TAKE 5 TO 10 ML BY MOUTH EVERY 4 HOURS AS NEEDED FOR COUGH OR CONGESTION (Patient taking differently: Take 5-10 mLs by mouth every 4 (four) hours as needed (cough/congestion).) 360 mL 0   No current facility-administered medications on file prior to visit.    Health Maintenance:   Immunization History  Administered Date(s) Administered   Influenza Split 07/16/2018   Influenza, High Dose Seasonal PF 07/31/2014, 07/31/2014, 08/06/2017,  08/06/2017, 07/09/2019, 07/09/2019, 07/07/2020   Influenza-Unspecified 09/13/2015, 07/23/2016, 08/06/2017, 07/23/2018   Moderna Sars-Covid-2 Vaccination 12/05/2019, 01/03/2020, 07/26/2020, 05/16/2021   Pneumococcal Conjugate-13 12/24/2015   Pneumococcal Polysaccharide-23 07/22/2012   Td 12/25/2005   Zoster, Live 01/09/2007   Tetanus: 2007 DUE declines, prefer to get PRN Pneumovax: 2013 Prevnar: 2017 Flu vaccine:2019 Zostavax: 2008 declines shingrix Covid 19: 2/2 + 2 boosters   Pap: Hysterectomy MGM:03/2020, patient has number to schedule - she has number to schedule, planning to defer in light of recent CTs/PET  DEXA: 10/2018 will get with MGM Colonoscopy: 2014 PET 2016 MRI brain 2016 CT AB/chest 02/2019  Vision: Dr. Wyatt Portela, last 09/2020, glasses Dental: Dr. Dalene Carrow, last 2022, q16m  Patient Care Team: [name], MD as PCP - General (Internal Medicine) Sharol Roussel, [place] as Physician Assistant (Optometry) Jettie Booze, MD as Consulting Physician (Interventional Cardiology) Teena Irani, MD (Inactive) as Consulting Physician (Gastroenterology) Druscilla Brownie, MD as Consulting Physician (Dermatology) Clent Jacks, MD as Consulting Physician (Ophthalmology) Erroll Luna, MD as Consulting Physician (General Surgery) Melrose Nakayama, MD as Consulting Physician (Orthopedic Surgery)  Medical History:  Past Medical History:  Diagnosis Date   Adenocarcinoma of left lung, stage 4 ([place]) 04/05/2015   Biopsy confirmed CT SCAN: There are innumerable bilateral pulmonary nodules. These range in size from about 5 mm to 2 cm. They demonstrate irregular indistinct borders, the larger ones demonstrating spiculation. PET SCAN: Diffuse pulmonary metastatic disease with a 4 cm dominant left lower low lung lesion. No enlarged or hypermetabolic mediastinal or hilar adenopathy.   Allergy    Anxiety    Cancer ([place])    Change of skin related to chemotherapy 09/25/2017    Drug-induced skin rash 01/17/2016   History of radiation therapy 12/18/2019   SBRT left lung  12/11/2019-12/18/2019   Dr Gery Pray   Hyperlipidemia    Hypertension    Insomnia    Prediabetes    Thyroid disease    Allergies Allergies  Allergen Reactions   Penicillins Swelling and Rash   SURGICAL HISTORY She  has a past surgical history that includes Tonsillectomy and adenoidectomy and Abdominal hysterectomy (1995). FAMILY HISTORY Her family history includes ALS in her father; Alcohol abuse in her mother and paternal grandfather; Bone cancer in her maternal uncle; Breast cancer in her cousin; Dementia (age of onset: 46) in her paternal aunt; Dementia (age of onset: 81) in her maternal grandmother; Heart disease in her paternal grandfather; Hypertension in her father; Liver disease in her mother; Lung cancer (age of onset: 13) in her paternal grandmother; Lung disease in her maternal grandfather; Melanoma in her paternal aunt. SOCIAL HISTORY She  reports that she quit smoking about 46 years ago. Her smoking use included cigarettes. She has a 5.00 pack-year smoking history. She has never used smokeless tobacco. She reports that she does not  drink alcohol and does not use drugs.   MEDICARE WELLNESS OBJECTIVES: Physical activity: Current Exercise Habits: Home exercise routine, Type of exercise: walking, Time (Minutes): 10, Frequency (Times/Week): 7, Weekly Exercise (Minutes/Week): 70, Intensity: Mild, Exercise limited by: orthopedic condition(s) Cardiac risk factors: Cardiac Risk Factors include: advanced age (>106men, >28 women);dyslipidemia;hypertension;sedentary lifestyle;obesity (BMI >30kg/m2);smoking/ tobacco exposure Depression/mood screen:   Depression screen [facility] 2/9 05/18/2021  Decreased Interest 0  Down, Depressed, Hopeless 1  PHQ - 2 Score 1  Some recent data might be hidden    ADLs:  In your present state of health, do you have any difficulty performing the following activities:  05/18/2021 11/20/2020  Hearing? N N  Vision? N N  Difficulty concentrating or making decisions? N N  Walking or climbing stairs? N N  Comment manages slowly -  Dressing or bathing? N N  Doing errands, shopping? N -  Some recent data might be hidden     Cognitive Testing  Alert? Yes  Normal Appearance?Yes  Oriented to person? Yes  Place? Yes   Time? Yes  Recall of three objects?  Yes  Can perform simple calculations? Yes  Displays appropriate judgment?Yes  Can read the correct time from a watch face?Yes  EOL planning: Does Patient Have a Medical Advance Directive?: Yes Type of Advance Directive: Healthcare Power of Attorney, Living will Does patient want to make changes to medical advance directive?: No - Patient declined Copy of [place] in Chart?: No - copy requested   Review of Systems: Review of Systems  Constitutional:  Negative for chills, fever and malaise/fatigue.  HENT:  Negative for congestion, ear pain and sore throat.   Eyes: Negative.   Respiratory:  Positive for shortness of breath (Chronic since lung cancer). Negative for cough and wheezing.   Cardiovascular:  Negative for chest pain, palpitations and leg swelling.  Gastrointestinal:  Negative for abdominal pain, blood in stool, constipation, diarrhea, heartburn and melena.  Genitourinary: Negative.   Skin:  Negative for itching and rash.  Neurological:  Negative for dizziness, sensory change, loss of consciousness and headaches.  Psychiatric/Behavioral:  Negative for depression, memory loss, substance abuse and suicidal ideas. The patient has insomnia (managing well with meds). The patient is not nervous/anxious.    Physical Exam: Estimated body mass index is 32.22 kg/m as calculated from the following:   Height as of this encounter: 5' (1.524 m).   Weight as of this encounter: 165 lb (74.8 kg). BP 126/76   Pulse 77   Temp (!) 97.2 F (36.2 C)   Ht 5' (1.524 m)   Wt 165 lb (74.8 kg)    SpO2 97%   BMI 32.22 kg/m   General Appearance: Well nourished well developed, in no apparent distress.  Eyes: PERRLA, EOMs, conjunctiva no swelling or erythema ENT/Mouth: Ear canals normal without obstruction, swelling, erythema, or discharge.  TMs normal bilaterally with no erythema, bulging, retraction, or loss of landmark.  Oropharynx moist and clear with no exudate, erythema, or swelling.  Frontal and maxillary tenderness noted. Neck: Supple, thyroid normal. No bruits.  No cervical adenopathy Respiratory: Respiratory effort normal, Breath sounds without rales.  Bilateral wheezing and rhonchi noted. Cardio: RRR without murmurs, rubs or gallops. Brisk peripheral pulses without edema.  retractions.  Abdomen: Soft, nontender, no guarding, rebound, hernias, masses, or organomegaly.  Lymphatics: Non tender without lymphadenopathy.  Musculoskeletal: Full ROM all peripheral extremities,5/5 strength, and normal gait.  Skin:  Non-tender, warm and dry Neuro: Awake and oriented X 3,  Cranial nerves intact, reflexes equal bilaterally. Normal muscle tone, no cerebellar symptoms. Sensation intact.  Psych:  normal affect, Insight and Judgment appropriate.    Medicare Attestation I have personally reviewed: The patient's medical and social history Their use of alcohol, tobacco or illicit drugs Their current medications and supplements The patient's functional ability including ADLs,fall risks, home safety risks, cognitive, and hearing and visual impairment Diet and physical activities Evidence for depression or mood disorders  The patient's weight, height, BMI, and visual acuity have been recorded in the chart.  I have made referrals, counseling, and provided education to the patient based on review of the above and I have provided the patient with a written personalized care plan for preventive services.    Izora Ribas, NP 12:58 PM [facility]

## 2021-05-17 ENCOUNTER — Encounter: Payer: Self-pay | Admitting: Internal Medicine

## 2021-05-18 ENCOUNTER — Encounter: Payer: Self-pay | Admitting: Adult Health

## 2021-05-18 ENCOUNTER — Ambulatory Visit (INDEPENDENT_AMBULATORY_CARE_PROVIDER_SITE_OTHER): Payer: Medicare Other | Admitting: Adult Health

## 2021-05-18 ENCOUNTER — Other Ambulatory Visit: Payer: Self-pay

## 2021-05-18 ENCOUNTER — Other Ambulatory Visit (HOSPITAL_COMMUNITY): Payer: Self-pay

## 2021-05-18 VITALS — BP 126/76 | HR 77 | Temp 97.2°F | Ht 60.0 in | Wt 165.0 lb

## 2021-05-18 DIAGNOSIS — E782 Mixed hyperlipidemia: Secondary | ICD-10-CM

## 2021-05-18 DIAGNOSIS — Z0001 Encounter for general adult medical examination with abnormal findings: Secondary | ICD-10-CM | POA: Diagnosis not present

## 2021-05-18 DIAGNOSIS — F419 Anxiety disorder, unspecified: Secondary | ICD-10-CM

## 2021-05-18 DIAGNOSIS — Z0000 Encounter for general adult medical examination without abnormal findings: Secondary | ICD-10-CM

## 2021-05-18 DIAGNOSIS — E669 Obesity, unspecified: Secondary | ICD-10-CM

## 2021-05-18 DIAGNOSIS — R7309 Other abnormal glucose: Secondary | ICD-10-CM

## 2021-05-18 DIAGNOSIS — C3492 Malignant neoplasm of unspecified part of left bronchus or lung: Secondary | ICD-10-CM

## 2021-05-18 DIAGNOSIS — G4700 Insomnia, unspecified: Secondary | ICD-10-CM

## 2021-05-18 DIAGNOSIS — E559 Vitamin D deficiency, unspecified: Secondary | ICD-10-CM

## 2021-05-18 DIAGNOSIS — R6889 Other general symptoms and signs: Secondary | ICD-10-CM

## 2021-05-18 DIAGNOSIS — E039 Hypothyroidism, unspecified: Secondary | ICD-10-CM

## 2021-05-18 DIAGNOSIS — I700 Atherosclerosis of aorta: Secondary | ICD-10-CM

## 2021-05-18 DIAGNOSIS — R0989 Other specified symptoms and signs involving the circulatory and respiratory systems: Secondary | ICD-10-CM | POA: Diagnosis not present

## 2021-05-18 DIAGNOSIS — Z79899 Other long term (current) drug therapy: Secondary | ICD-10-CM

## 2021-05-18 DIAGNOSIS — M8580 Other specified disorders of bone density and structure, unspecified site: Secondary | ICD-10-CM | POA: Insufficient documentation

## 2021-05-18 MED ORDER — EZETIMIBE 10 MG PO TABS: 10.0000 mg | ORAL_TABLET | Freq: Every day | ORAL | 3 refills | Status: DC

## 2021-05-18 NOTE — Patient Instructions (Signed)
Ezetimibe Tablets What is this medication? EZETIMIBE (ez ET i mibe) treats high cholesterol. It works by reducing the amount of cholesterol absorbed from the food you eat. This decreases the amount of bad cholesterol (such as LDL) in your blood. Changes to diet and exerciseare often combined with this medication. This medicine may be used for other purposes; ask your health care provider orpharmacist if you have questions. COMMON BRAND NAME(S): Zetia What should I tell my care team before I take this medication? They need to know if you have any of these conditions: Kidney disease Liver disease Muscle cramps, pain Muscle injury Thyroid disease An unusual or allergic reaction to ezetimibe, other medications, foods, dyes, or preservatives Pregnant or trying to get pregnant Breast-feeding How should I use this medication? Take this medication by mouth. Take it as directed on the prescription label at the same time every day. You can take it with or without food. If it upsets your stomach, take it with food. Keep taking it unless your care team tells youto stop. Take bile acid sequestrants at a different time of day than this medication.Take this medication 2 hours BEFORE or 4 hours AFTER bile acid sequestrants. Talk to your care team about the use of this medication in children. While it may be prescribed for children as young as 10 for selected conditions,precautions do apply. Overdosage: If you think you have taken too much of this medicine contact apoison control center or emergency room at once. NOTE: This medicine is only for you. Do not share this medicine with others. What if I miss a dose? If you miss a dose, take it as soon as you can. If it is almost time for yournext dose, take only that dose. Do not take double or extra doses. What may interact with this medication? Do not take this medication with any of the following: Fenofibrate Gemfibrozil This medication may also interact  with the following: Antacids Cyclosporine Herbal medications like red yeast rice Other medications to lower cholesterol or triglycerides This list may not describe all possible interactions. Give your health care provider a list of all the medicines, herbs, non-prescription drugs, or dietary supplements you use. Also tell them if you smoke, drink alcohol, or use illegaldrugs. Some items may interact with your medicine. What should I watch for while using this medication? Visit your care team for regular checks on your progress. Tell your care teamif your symptoms do not start to get better or if they get worse. Your care team may tell you to stop taking this medication if you develop muscle problems. If your muscle problems do not go away after stopping thismedication, contact your care team. Do not become pregnant while taking this medication. Women should inform their care team if they wish to become pregnant or think they might be pregnant. There is potential for serious harm to an unborn child. Talk to your care teamfor more information. Do not breast-feed an infant while taking this medication. Taking this medication is only part of a total heart healthy program. Your care team may give you a special diet to follow. Avoid alcohol. Avoid smoking. Askyour care team how much you should exercise. What side effects may I notice from receiving this medication? Side effects that you should report to your doctor or health care provider assoon as possible: Allergic reactions-skin rash, itching or hives, swelling of the face, lips, tongue, or throat Side effects that usually do not require medical attention (report to yourdoctor or health  care provider if they continue or are bothersome): Diarrhea Joint pain This list may not describe all possible side effects. Call your doctor for medical advice about side effects. You may report side effects to FDA at1-800-FDA-1088. Where should I keep my  medication? Keep out of the reach of children and pets. Store at room temperature between 15 and 30 degrees C (59 and 86 degrees F). Protect from moisture. Get rid of any unused medication after the expirationdate. NOTE: This sheet is a summary. It may not cover all possible information. If you have questions about this medicine, talk to your doctor, pharmacist, orhealth care provider.  2022 Elsevier/Gold Standard (2020-11-05 12:29:13)

## 2021-05-19 LAB — COMPLETE METABOLIC PANEL WITHOUT GFR
AG Ratio: 1.3 (calc) (ref 1.0–2.5)
ALT: 19 U/L (ref 6–29)
AST: 31 U/L (ref 10–35)
Albumin: 3.9 g/dL (ref 3.6–5.1)
Alkaline phosphatase (APISO): 68 U/L (ref 37–153)
BUN: 12 mg/dL (ref 7–25)
CO2: 28 mmol/L (ref 20–32)
Calcium: 9.1 mg/dL (ref 8.6–10.4)
Chloride: 101 mmol/L (ref 98–110)
Creat: 0.83 mg/dL (ref 0.60–1.00)
Globulin: 2.9 g/dL (ref 1.9–3.7)
Glucose, Bld: 84 mg/dL (ref 65–99)
Potassium: 4.2 mmol/L (ref 3.5–5.3)
Sodium: 139 mmol/L (ref 135–146)
Total Bilirubin: 0.9 mg/dL (ref 0.2–1.2)
Total Protein: 6.8 g/dL (ref 6.1–8.1)
eGFR: 74 mL/min/{1.73_m2}

## 2021-05-19 LAB — CBC WITH DIFFERENTIAL/PLATELET
Absolute Monocytes: 726 {cells}/uL (ref 200–950)
Basophils Absolute: 42 {cells}/uL (ref 0–200)
Basophils Relative: 0.8 %
Eosinophils Absolute: 270 {cells}/uL (ref 15–500)
Eosinophils Relative: 5.1 %
HCT: 39.8 % (ref 35.0–45.0)
Hemoglobin: 12.9 g/dL (ref 11.7–15.5)
Lymphs Abs: 1002 {cells}/uL (ref 850–3900)
MCH: 27.7 pg (ref 27.0–33.0)
MCHC: 32.4 g/dL (ref 32.0–36.0)
MCV: 85.4 fL (ref 80.0–100.0)
MPV: 11.5 fL (ref 7.5–12.5)
Monocytes Relative: 13.7 %
Neutro Abs: 3260 {cells}/uL (ref 1500–7800)
Neutrophils Relative %: 61.5 %
Platelets: 178 10*3/uL (ref 140–400)
RBC: 4.66 Million/uL (ref 3.80–5.10)
RDW: 13.1 % (ref 11.0–15.0)
Total Lymphocyte: 18.9 %
WBC: 5.3 10*3/uL (ref 3.8–10.8)

## 2021-05-19 LAB — TSH: TSH: 2.62 mIU/L (ref 0.40–4.50)

## 2021-05-19 LAB — LIPID PANEL
Cholesterol: 171 mg/dL (ref <200)
HDL: 51 mg/dL (ref >=50)
LDL Cholesterol (Calc): 104 mg/dL (calc) — ABNORMAL HIGH
Non-HDL Cholesterol (Calc): 120 mg/dL (calc) (ref <130)
Total CHOL/HDL Ratio: 3.4 (calc) (ref <5.0)
Triglycerides: 70 mg/dL (ref <150)

## 2021-05-19 LAB — MAGNESIUM: Magnesium: 2 mg/dL (ref 1.5–2.5)

## 2021-05-23 ENCOUNTER — Other Ambulatory Visit: Payer: Self-pay | Admitting: Physician Assistant

## 2021-05-24 ENCOUNTER — Other Ambulatory Visit (HOSPITAL_COMMUNITY): Payer: Self-pay

## 2021-05-25 ENCOUNTER — Inpatient Hospital Stay: Payer: Medicare Other | Attending: Internal Medicine

## 2021-05-25 ENCOUNTER — Other Ambulatory Visit: Payer: Self-pay

## 2021-05-25 ENCOUNTER — Inpatient Hospital Stay (HOSPITAL_BASED_OUTPATIENT_CLINIC_OR_DEPARTMENT_OTHER): Payer: Medicare Other | Admitting: Internal Medicine

## 2021-05-25 VITALS — BP 126/64 | HR 83 | Temp 97.1°F | Resp 19 | Ht 60.0 in | Wt 165.5 lb

## 2021-05-25 DIAGNOSIS — M2550 Pain in unspecified joint: Secondary | ICD-10-CM | POA: Diagnosis not present

## 2021-05-25 DIAGNOSIS — Z5111 Encounter for antineoplastic chemotherapy: Secondary | ICD-10-CM

## 2021-05-25 DIAGNOSIS — Z923 Personal history of irradiation: Secondary | ICD-10-CM | POA: Diagnosis not present

## 2021-05-25 DIAGNOSIS — F419 Anxiety disorder, unspecified: Secondary | ICD-10-CM | POA: Insufficient documentation

## 2021-05-25 DIAGNOSIS — I10 Essential (primary) hypertension: Secondary | ICD-10-CM | POA: Diagnosis not present

## 2021-05-25 DIAGNOSIS — C792 Secondary malignant neoplasm of skin: Secondary | ICD-10-CM | POA: Diagnosis not present

## 2021-05-25 DIAGNOSIS — E079 Disorder of thyroid, unspecified: Secondary | ICD-10-CM | POA: Diagnosis not present

## 2021-05-25 DIAGNOSIS — C3432 Malignant neoplasm of lower lobe, left bronchus or lung: Secondary | ICD-10-CM | POA: Insufficient documentation

## 2021-05-25 DIAGNOSIS — C3492 Malignant neoplasm of unspecified part of left bronchus or lung: Secondary | ICD-10-CM

## 2021-05-25 DIAGNOSIS — E785 Hyperlipidemia, unspecified: Secondary | ICD-10-CM | POA: Insufficient documentation

## 2021-05-25 DIAGNOSIS — R197 Diarrhea, unspecified: Secondary | ICD-10-CM | POA: Diagnosis not present

## 2021-05-25 DIAGNOSIS — Z880 Allergy status to penicillin: Secondary | ICD-10-CM | POA: Insufficient documentation

## 2021-05-25 DIAGNOSIS — Z90722 Acquired absence of ovaries, bilateral: Secondary | ICD-10-CM | POA: Insufficient documentation

## 2021-05-25 DIAGNOSIS — Z79899 Other long term (current) drug therapy: Secondary | ICD-10-CM | POA: Insufficient documentation

## 2021-05-25 LAB — CMP (CANCER CENTER ONLY)
ALT: 22 U/L (ref 0–44)
AST: 27 U/L (ref 15–41)
Albumin: 3.5 g/dL (ref 3.5–5.0)
Alkaline Phosphatase: 63 U/L (ref 38–126)
Anion gap: 9 (ref 5–15)
BUN: 12 mg/dL (ref 8–23)
CO2: 27 mmol/L (ref 22–32)
Calcium: 9.1 mg/dL (ref 8.9–10.3)
Chloride: 103 mmol/L (ref 98–111)
Creatinine: 0.94 mg/dL (ref 0.44–1.00)
GFR, Estimated: >60 mL/min (ref >60)
Glucose, Bld: 91 mg/dL (ref 70–99)
Potassium: 4 mmol/L (ref 3.5–5.1)
Sodium: 139 mmol/L (ref 135–145)
Total Bilirubin: 1 mg/dL (ref 0.3–1.2)
Total Protein: 6.6 g/dL (ref 6.5–8.1)

## 2021-05-25 LAB — CBC WITH DIFFERENTIAL (CANCER CENTER ONLY)
Abs Immature Granulocytes: 0.02 10*3/uL (ref 0.00–0.07)
Basophils Absolute: 0 10*3/uL (ref 0.0–0.1)
Basophils Relative: 1 %
Eosinophils Absolute: 0.2 10*3/uL (ref 0.0–0.5)
Eosinophils Relative: 4 %
HCT: 37.2 % (ref 36.0–46.0)
Hemoglobin: 12.4 g/dL (ref 12.0–15.0)
Immature Granulocytes: 0 %
Lymphocytes Relative: 20 %
Lymphs Abs: 1.3 10*3/uL (ref 0.7–4.0)
MCH: 27.9 pg (ref 26.0–34.0)
MCHC: 33.3 g/dL (ref 30.0–36.0)
MCV: 83.6 fL (ref 80.0–100.0)
Monocytes Absolute: 0.6 10*3/uL (ref 0.1–1.0)
Monocytes Relative: 10 %
Neutro Abs: 4.2 10*3/uL (ref 1.7–7.7)
Neutrophils Relative %: 65 %
Platelet Count: 186 10*3/uL (ref 150–400)
RBC: 4.45 MIL/uL (ref 3.87–5.11)
RDW: 12.8 % (ref 11.5–15.5)
WBC Count: 6.4 10*3/uL (ref 4.0–10.5)
nRBC: 0 % (ref 0.0–0.2)

## 2021-05-25 NOTE — Progress Notes (Signed)
[facility] Telephone:([phone]) [phone]   Fax:(336) [phone]  OFFICE PROGRESS NOTE  [name], MD [address] [place] [address]  DIAGNOSIS: Stage IV (T2a, N0, M1a) non-small cell lung cancer, adenocarcinoma with positive EGFR mutation with deletion in exon 19 diagnosed in June 2016 and presented with large mass in the left lower lobe in addition to multiple bilateral pulmonary nodules  PRIOR THERAPY:  1) Gilotrif 40 mg by mouth daily started 05/11/2015, status post 9 months of treatment discontinued on 02/17/2016 secondary to extensive skin rash. 2) SBRT to the enlarging left upper lobe nodule under the care of Dr. Lisbeth Renshaw.  CURRENT THERAPY: Gilotrif 30 mg by mouth daily started 02/21/2016 status post 63 months of treatment.  INTERVAL HISTORY: Elizabeth [name] 75 y.o. female returns to the clinic today for follow-up visit.  The patient is feeling fine today with no concerning complaints.  She continues to tolerate her treatment with Gilotrif fairly well.  Her last PET scan showed 3 hypermetabolic nodules 1 in the left shoulder musculature concerning for a cutaneous metastasis.  There was also enlarging nodule in the retroperitoneum anterior to the left iliac [place] muscle and enlarging hypermetabolic nodule in the left upper lobe concerning for lung cancer recurrence.  The patient underwent CT-guided biopsy of the left shoulder suspicious cutaneous metastasis and the final pathology showed atypical lymphoid infiltrate suspicious for low-grade lymphoma.  The pathologist requested additional tissue possibly by excisional biopsy for confirmation of the diagnosis.  The patient denied having any current chest pain, shortness of breath, cough or hemoptysis.  She denied having any fever or chills.  She has no nausea, vomiting, diarrhea or constipation.  She has no headache or visual changes.  She is here today for evaluation and discussion of her condition and  treatment options.  MEDICAL HISTORY: Past Medical History:  Diagnosis Date   Adenocarcinoma of left lung, stage 4 ([place]) 04/05/2015   Biopsy confirmed CT SCAN: There are innumerable bilateral pulmonary nodules. These range in size from about 5 mm to 2 cm. They demonstrate irregular indistinct borders, the larger ones demonstrating spiculation. PET SCAN: Diffuse pulmonary metastatic disease with a 4 cm dominant left lower low lung lesion. No enlarged or hypermetabolic mediastinal or hilar adenopathy.   Allergy    Anxiety    Cancer ([place])    Change of skin related to chemotherapy 09/25/2017   Drug-induced skin rash 01/17/2016   History of radiation therapy 12/18/2019   SBRT left lung  12/11/2019-12/18/2019   Dr Gery Pray   Hyperlipidemia    Hypertension    Insomnia    Prediabetes    Thyroid disease     ALLERGIES:  is allergic to penicillins.  MEDICATIONS:  Current Outpatient Medications  Medication Sig Dispense Refill   afatinib dimaleate (GILOTRIF) 30 MG tablet TAKE 1 TABLET (30MG) BY MOUTH ONCE DAILY. TAKE ON AN EMPTY STOMACH 1 HOUR BEFORE OR 2 HOURS AFTER A MEAL (Patient taking differently: Take 30 mg by mouth at bedtime. TAKE ON AN EMPTY STOMACH 1 HOUR BEFORE OR 2 HOURS AFTER A MEAL) 30 tablet 2   ALPRAZolam (XANAX) 1 MG tablet Take 1 mg by mouth at bedtime.     aspirin 81 MG tablet Take 81 mg by mouth at bedtime.      ezetimibe (ZETIA) 10 MG tablet Take 1 tablet (10 mg total) by mouth daily. 90 tablet 3   hydrochlorothiazide (HYDRODIURIL) 25 MG tablet Take 25 mg by mouth  daily.     ibuprofen (ADVIL,MOTRIN) 200 MG tablet Take 400 mg by mouth every 6 (six) hours as needed for headache or moderate pain.     levothyroxine (SYNTHROID) 50 MCG tablet TAKE 1 TABLET BY MOUTH DAILY ON AN EMPTY STOMACH WITH ONLY WATER FOR 30 MINUTES. NO ANTACID, CALCIUM, OR MAGNESIUM FOR 4 HOURS. AVOID BIOTIN (Patient taking differently: Take 50 mcg by mouth daily before breakfast. NO ANTACID, CALCIUM, OR  MAGNESIUM FOR 4 HOURS. AVOID BIOTIN) 90 tablet 3   loratadine-pseudoephedrine (CLARITIN-D 12-HOUR) 5-120 MG tablet Take 1 tablet by mouth 2 (two) times daily as needed for allergies.     Melatonin 10 MG CAPS Take 10 mg by mouth at bedtime.     montelukast (SINGULAIR) 10 MG tablet TAKE 1 TABLET BY MOUTH DAILY FOR ALLERGIES (Patient taking differently: Take 10 mg by mouth daily.) 90 tablet 3   Promethazine-Codeine 6.25-10 MG/5ML SOLN TAKE 5 TO 10 ML BY MOUTH EVERY 4 HOURS AS NEEDED FOR COUGH OR CONGESTION (Patient taking differently: Take 5-10 mLs by mouth every 4 (four) hours as needed (cough/congestion).) 360 mL 0   No current facility-administered medications for this visit.    SURGICAL HISTORY:  Past Surgical History:  Procedure Laterality Date   ABDOMINAL HYSTERECTOMY  1995   w BSO   TONSILLECTOMY AND ADENOIDECTOMY      REVIEW OF SYSTEMS:  Constitutional: negative Eyes: negative Ears, nose, mouth, throat, and face: negative Respiratory: negative Cardiovascular: negative Gastrointestinal: positive for diarrhea Genitourinary:negative Integument/breast: negative Hematologic/lymphatic: negative Musculoskeletal:positive for arthralgias Neurological: negative Behavioral/Psych: negative Endocrine: negative Allergic/Immunologic: negative   PHYSICAL EXAMINATION: General appearance: alert, cooperative, and no distress Head: Normocephalic, without obvious abnormality, atraumatic Neck: no adenopathy, no JVD, supple, symmetrical, trachea midline, and thyroid not enlarged, symmetric, no tenderness/mass/nodules Lymph nodes: Cervical, supraclavicular, and axillary nodes normal. Resp: clear to auscultation bilaterally Back: symmetric, no curvature. ROM normal. No CVA tenderness. Cardio: regular rate and rhythm, S1, S2 normal, no murmur, click, rub or gallop GI: soft, non-tender; bowel sounds normal; no masses,  no organomegaly Extremities: extremities normal, atraumatic, no cyanosis or  edema Neurologic: Alert and oriented X 3, normal strength and tone. Normal symmetric reflexes. Normal coordination and gait  ECOG PERFORMANCE STATUS: 1 - Symptomatic but completely ambulatory  Blood pressure 126/64, pulse 83, temperature (!) 97.1 F (36.2 C), temperature source Tympanic, resp. rate 19, height 5' (1.524 m), weight 165 lb 8 oz (75.1 kg), SpO2 97 %.  LABORATORY DATA: Lab Results  Component Value Date   WBC 6.4 05/25/2021   HGB 12.4 05/25/2021   HCT 37.2 05/25/2021   MCV 83.6 05/25/2021   PLT 186 05/25/2021      Chemistry      Component Value Date/Time   NA 139 05/25/2021 0840   NA 139 10/26/2017 1252   K 4.0 05/25/2021 0840   K 4.1 10/26/2017 1252   CL 103 05/25/2021 0840   CO2 27 05/25/2021 0840   CO2 28 10/26/2017 1252   BUN 12 05/25/2021 0840   BUN 15.7 10/26/2017 1252   CREATININE 0.94 05/25/2021 0840   CREATININE 0.83 05/18/2021 1213   CREATININE 1.0 10/26/2017 1252      Component Value Date/Time   CALCIUM 9.1 05/25/2021 0840   CALCIUM 8.8 10/26/2017 1252   ALKPHOS 63 05/25/2021 0840   ALKPHOS 70 10/26/2017 1252   AST 27 05/25/2021 0840   AST 19 10/26/2017 1252   ALT 22 05/25/2021 0840   ALT 22 10/26/2017 1252   BILITOT 1.0  05/25/2021 0840   BILITOT 0.90 10/26/2017 1252       RADIOGRAPHIC STUDIES: CT BIOPSY  Result Date: 05/25/2021 INDICATION: 75 year old female with a history FDG avid soft tissue nodule of the left upper extremity overlying the shoulder. History of non-small cell carcinoma of the chest referred for biopsy EXAM: CT BIOPSY MEDICATIONS: None. ANESTHESIA/SEDATION: Moderate (conscious) sedation was employed during this procedure. A total of Versed 2.0 mg and Fentanyl 100 mcg was administered intravenously. Moderate Sedation Time: 15 minutes. The patient's level of consciousness and vital signs were monitored continuously by radiology nursing throughout the procedure under my direct supervision. FLUOROSCOPY TIME:  CT COMPLICATIONS:  None PROCEDURE: Informed written consent was obtained from the patient after a thorough discussion of the procedural risks, benefits and alternatives. All questions were addressed. Maximal Sterile Barrier Technique was utilized including caps, mask, sterile gowns, sterile gloves, sterile drape, hand hygiene and skin antiseptic. A timeout was performed prior to the initiation of the procedure. Patient positioned supine position on CT gantry table. Scout CT of the left shoulder was performed for planning purposes. Once the target nodule was identified on the CT, ultrasound was performed, confirming the lesion on ultrasound images. The patient was then prepped and draped in the usual sterile fashion. A sterile gown and sterile gloves were used for the procedure. Local anesthesia was provided with 1% Lidocaine. Ultrasound guidance was used to infiltrate the region with 1% lidocaine for local anesthesia. Multiple 18 gauge core biopsy were then acquired of the soft tissue nodule using ultrasound guidance. Images were stored. Final image was stored after biopsy. Patient tolerated the procedure well and remained hemodynamically stable throughout. No complications were encountered and no significant blood loss was encounter IMPRESSION: Status post image guided biopsy of superficial soft tissue nodule overlying the left shoulder musculature, with CT imaging for correlation and ultrasound-guided biopsy. Signed, Dulcy Fanny. Dellia Nims, RPVI [facility] Radiology Electronically Signed   By: Corrie Mckusick D.O.   On: May 25, 2021 14:59     ASSESSMENT AND PLAN:  This is a very pleasant 75 years old white female with stage IV non-small cell lung cancer, adenocarcinoma with positive EGFR mutation with deletion in exon 19 and currently undergoing treatment with Gilotrif initially at a dose of 40 mg for 9 months followed by 30 mg by mouth daily status post 62 months.  She also underwent  SBRT to the enlarging left upper lobe lung nodule. The patient continues to tolerate her treatment with Gilotrif fairly well except for mild intermittent diarrhea and rash in the scalp. She was found on recent imaging studies of the chest, abdomen pelvis to have suspicious nodules in the left lung as well as the retroperitoneum suspicious for disease progression. Her recent PET scan showed hypermetabolic subcutaneous nodule anterior to the left shoulder musculature concerning for cutaneous metastasis in addition to the enlarging nodule in the retroperitoneum anterior to the left iliac [place] muscle that also hypermetabolic and concerning for metastatic deposit with enlarging hypermetabolic nodule and consolidation peripherally in the left upper lobe concerning for lung cancer recurrence. 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 I recommended for the patient to see general surgery for consideration of excisional biopsy of the retroperitoneal soft tissue deposit to rule out metastatic lung cancer versus lymphoma. Regarding the enlarging left upper lobe lung nodule, the patient will  see Dr. Sondra Come for consideration of SBRT to this area. She will continue her current treatment with Gilotrif for now. I will see her back for follow-up visit in 6 weeks for evaluation and repeat blood work and further recommendation regarding her condition based on the final biopsy results. The patient was advised to call immediately if she has any other concerning symptoms in the interval. The patient voices understanding of current disease status and treatment options and is in agreement with the current care plan. All questions were answered. The patient knows to call the clinic with any problems, questions or concerns. We can certainly see the  patient much sooner if necessary.  Disclaimer: This note was dictated with voice recognition software. Similar sounding words can inadvertently be transcribed and may not be corrected upon review.

## 2021-05-30 ENCOUNTER — Telehealth: Payer: Self-pay | Admitting: Internal Medicine

## 2021-05-30 NOTE — Telephone Encounter (Signed)
Scheduled per los. Called and spoke with patient. Confirmed appt 

## 2021-06-06 ENCOUNTER — Other Ambulatory Visit: Payer: Self-pay

## 2021-06-06 ENCOUNTER — Ambulatory Visit
Admission: RE | Admit: 2021-06-06 | Discharge: 2021-06-06 | Disposition: A | Discharge status: Home / Self Care | Payer: Medicare Other | Source: Ambulatory Visit | Attending: Radiation Oncology | Admitting: Radiation Oncology

## 2021-06-06 DIAGNOSIS — C7802 Secondary malignant neoplasm of left lung: Secondary | ICD-10-CM | POA: Insufficient documentation

## 2021-06-06 DIAGNOSIS — Z510 Encounter for antineoplastic radiation therapy: Secondary | ICD-10-CM | POA: Diagnosis present

## 2021-06-06 DIAGNOSIS — C3492 Malignant neoplasm of unspecified part of left bronchus or lung: Secondary | ICD-10-CM

## 2021-06-14 ENCOUNTER — Ambulatory Visit
Admission: RE | Admit: 2021-06-14 | Discharge: 2021-06-14 | Disposition: A | Discharge status: Home / Self Care | Payer: Medicare Other | Source: Ambulatory Visit | Attending: Radiation Oncology | Admitting: Radiation Oncology

## 2021-06-14 ENCOUNTER — Other Ambulatory Visit: Payer: Self-pay

## 2021-06-14 DIAGNOSIS — C7802 Secondary malignant neoplasm of left lung: Secondary | ICD-10-CM | POA: Diagnosis not present

## 2021-06-14 DIAGNOSIS — C3492 Malignant neoplasm of unspecified part of left bronchus or lung: Secondary | ICD-10-CM

## 2021-06-15 ENCOUNTER — Ambulatory Visit: Payer: Medicare Other | Admitting: Radiation Oncology

## 2021-06-15 ENCOUNTER — Other Ambulatory Visit (HOSPITAL_COMMUNITY): Payer: Self-pay

## 2021-06-16 ENCOUNTER — Ambulatory Visit
Admission: RE | Admit: 2021-06-16 | Discharge: 2021-06-16 | Disposition: A | Discharge status: Home / Self Care | Payer: Medicare Other | Source: Ambulatory Visit | Attending: Radiation Oncology | Admitting: Radiation Oncology

## 2021-06-16 DIAGNOSIS — C7802 Secondary malignant neoplasm of left lung: Secondary | ICD-10-CM | POA: Diagnosis not present

## 2021-06-16 DIAGNOSIS — C3492 Malignant neoplasm of unspecified part of left bronchus or lung: Secondary | ICD-10-CM

## 2021-06-20 ENCOUNTER — Ambulatory Visit: Payer: Self-pay | Admitting: Surgery

## 2021-06-21 ENCOUNTER — Ambulatory Visit
Admission: RE | Admit: 2021-06-21 | Discharge: 2021-06-21 | Disposition: A | Discharge status: Home / Self Care | Payer: Medicare Other | Source: Ambulatory Visit | Attending: Radiation Oncology | Admitting: Radiation Oncology

## 2021-06-21 ENCOUNTER — Encounter: Payer: Self-pay | Admitting: Radiation Oncology

## 2021-06-21 ENCOUNTER — Other Ambulatory Visit: Payer: Self-pay

## 2021-06-21 DIAGNOSIS — C7802 Secondary malignant neoplasm of left lung: Secondary | ICD-10-CM | POA: Diagnosis not present

## 2021-06-21 DIAGNOSIS — C3492 Malignant neoplasm of unspecified part of left bronchus or lung: Secondary | ICD-10-CM

## 2021-06-23 ENCOUNTER — Other Ambulatory Visit (HOSPITAL_COMMUNITY): Payer: Self-pay

## 2021-06-24 ENCOUNTER — Encounter: Payer: Self-pay | Admitting: Internal Medicine

## 2021-06-28 ENCOUNTER — Other Ambulatory Visit: Payer: Self-pay | Admitting: Adult Health

## 2021-07-05 ENCOUNTER — Encounter (HOSPITAL_BASED_OUTPATIENT_CLINIC_OR_DEPARTMENT_OTHER): Payer: Self-pay

## 2021-07-05 ENCOUNTER — Emergency Department (HOSPITAL_BASED_OUTPATIENT_CLINIC_OR_DEPARTMENT_OTHER)
Admission: EM | Admit: 2021-07-05 | Discharge: 2021-07-06 | Disposition: A | Discharge status: Home / Self Care | Payer: Medicare Other | Attending: Emergency Medicine | Admitting: Emergency Medicine

## 2021-07-05 ENCOUNTER — Other Ambulatory Visit: Payer: Self-pay

## 2021-07-05 ENCOUNTER — Emergency Department (HOSPITAL_BASED_OUTPATIENT_CLINIC_OR_DEPARTMENT_OTHER): Payer: Medicare Other | Admitting: Radiology

## 2021-07-05 DIAGNOSIS — Z20822 Contact with and (suspected) exposure to covid-19: Secondary | ICD-10-CM | POA: Diagnosis not present

## 2021-07-05 DIAGNOSIS — Z87891 Personal history of nicotine dependence: Secondary | ICD-10-CM | POA: Diagnosis not present

## 2021-07-05 DIAGNOSIS — M549 Dorsalgia, unspecified: Secondary | ICD-10-CM | POA: Insufficient documentation

## 2021-07-05 DIAGNOSIS — R079 Chest pain, unspecified: Secondary | ICD-10-CM | POA: Diagnosis present

## 2021-07-05 DIAGNOSIS — E039 Hypothyroidism, unspecified: Secondary | ICD-10-CM | POA: Insufficient documentation

## 2021-07-05 DIAGNOSIS — I208 Other forms of angina pectoris: Secondary | ICD-10-CM

## 2021-07-05 DIAGNOSIS — R072 Precordial pain: Secondary | ICD-10-CM | POA: Diagnosis present

## 2021-07-05 DIAGNOSIS — M25512 Pain in left shoulder: Secondary | ICD-10-CM | POA: Insufficient documentation

## 2021-07-05 DIAGNOSIS — I10 Essential (primary) hypertension: Secondary | ICD-10-CM | POA: Diagnosis not present

## 2021-07-05 DIAGNOSIS — Z85118 Personal history of other malignant neoplasm of bronchus and lung: Secondary | ICD-10-CM

## 2021-07-05 DIAGNOSIS — Z79899 Other long term (current) drug therapy: Secondary | ICD-10-CM | POA: Diagnosis not present

## 2021-07-05 LAB — BASIC METABOLIC PANEL
Anion gap: 10 (ref 5–15)
BUN: 15 mg/dL (ref 8–23)
CO2: 28 mmol/L (ref 22–32)
Calcium: 9.1 mg/dL (ref 8.9–10.3)
Chloride: 102 mmol/L (ref 98–111)
Creatinine, Ser: 0.85 mg/dL (ref 0.44–1.00)
GFR, Estimated: >60 mL/min (ref >60)
Glucose, Bld: 97 mg/dL (ref 70–99)
Potassium: 3.5 mmol/L (ref 3.5–5.1)
Sodium: 140 mmol/L (ref 135–145)

## 2021-07-05 LAB — CBC
HCT: 38.1 % (ref 36.0–46.0)
Hemoglobin: 12.7 g/dL (ref 12.0–15.0)
MCH: 28.3 pg (ref 26.0–34.0)
MCHC: 33.3 g/dL (ref 30.0–36.0)
MCV: 84.9 fL (ref 80.0–100.0)
Platelets: 202 10*3/uL (ref 150–400)
RBC: 4.49 MIL/uL (ref 3.87–5.11)
RDW: 13.4 % (ref 11.5–15.5)
WBC: 9.8 10*3/uL (ref 4.0–10.5)
nRBC: 0 % (ref 0.0–0.2)

## 2021-07-05 LAB — RESP PANEL BY RT-PCR (FLU A&B, COVID) ARPGX2
Influenza A by PCR: NEGATIVE
Influenza B by PCR: NEGATIVE
SARS Coronavirus 2 by RT PCR: NEGATIVE

## 2021-07-05 LAB — D-DIMER, QUANTITATIVE: D-Dimer, Quant: 1.59 ug/mL-FEU — ABNORMAL HIGH (ref 0.00–0.50)

## 2021-07-05 LAB — TROPONIN I (HIGH SENSITIVITY)
Troponin I (High Sensitivity): 4 ng/L (ref <18)
Troponin I (High Sensitivity): 5 ng/L (ref <18)

## 2021-07-05 NOTE — ED Provider Notes (Signed)
[facility] EMERGENCY DEPT Provider Note   CSN: [id number] Arrival date & time: 07/05/21  1635     History Chief Complaint  Patient presents with   Chest Pain    Elizabeth [name] is a 75 y.o. female.  75 year old female with history of lung cancer, hyperlipidemia, hypertension, prediabetes been to the ER secondary to chest pain.  Patient reports approximately around 2 PM today she was in the grocery store walking when she began to experience sudden onset substernal chest pain radiating to her back.  Intermittent radiation to her left shoulder.  No associated dyspnea, nausea.  Pain worsened with exertion.  Pain progressively worsened until she was given aspirin nitroglycerin by EMS just prior to arrival.  Pain resolved following nitroglycerin.  She is currently asymptomatic.  Patient has not experienced the symptoms in the past.  She has been seen by cardiology in the past and received a stress test multiple years ago.  No history of cardiac cath.  The history is provided by the patient.  Chest Pain Pain location:  Substernal area Pain quality: aching, pressure, radiating and sharp   Pain radiates to:  L shoulder and upper back Pain severity:  Mild Onset quality:  Sudden Timing:  Constant Progression:  Resolved Chronicity:  New Relieved by:  Aspirin and nitroglycerin Worsened by:  Exertion Associated symptoms: no abdominal pain, no cough, no dysphagia, no fever, no headache, no nausea, no palpitations, no shortness of breath and no vomiting       Past Medical History:  Diagnosis Date   Adenocarcinoma of left lung, stage 4 ([place]) 04/05/2015   Biopsy confirmed CT SCAN: There are innumerable bilateral pulmonary nodules. These range in size from about 5 mm to 2 cm. They demonstrate irregular indistinct borders, the larger ones demonstrating spiculation. PET SCAN: Diffuse pulmonary metastatic disease with a 4 cm dominant left lower low lung lesion. No enlarged or  hypermetabolic mediastinal or hilar adenopathy.   Allergy    Anxiety    Cancer ([place])    Change of skin related to chemotherapy 09/25/2017   Drug-induced skin rash 01/17/2016   History of radiation therapy 12/18/2019   SBRT left lung  12/11/2019-12/18/2019   Dr Gery Pray   Hyperlipidemia    Hypertension    Insomnia    Prediabetes    Thyroid disease     Patient Active Problem List   Diagnosis Date Noted   Chest pain 07/05/2021   Osteopenia 05/18/2021   Obesity (BMI 30.0-34.9) 05/16/2021   Labile hypertension 10/07/2019   Aortic atherosclerosis ([place]) by Abd CT scan on 01/26/2-021 05/29/2016   Encounter for antineoplastic chemotherapy 10/19/2015   Adenocarcinoma of left lung, stage 4 ([place]) 04/05/2015   Medication management 11/03/2014   Vitamin D deficiency 10/21/2013   Hypothyroidism 10/21/2013   Essential hypertension 10/21/2013   Hyperlipidemia, mixed 10/21/2013   Abnormal glucose    Anxiety    Insomnia     Past Surgical History:  Procedure Laterality Date   ABDOMINAL HYSTERECTOMY  1995   w BSO   TONSILLECTOMY AND ADENOIDECTOMY       OB History   No obstetric history on file.     Family History  Problem Relation Age of Onset   Liver disease Mother    Alcohol abuse Mother    ALS Father    Hypertension Father    Dementia Maternal Grandmother 94   Lung disease Maternal Grandfather    Lung cancer Paternal Grandmother 68   Alcohol abuse Paternal Grandfather  Heart disease Paternal Grandfather    Melanoma Paternal Aunt    Bone cancer Maternal Uncle    Dementia Paternal Aunt 90   Breast cancer Cousin     Social History   Tobacco Use   Smoking status: Former    Packs/day: 0.50    Years: 10.00    Pack years: 5.00    Types: Cigarettes    Quit date: 10/23/1974    Years since quitting: 46.7   Smokeless tobacco: Never  Vaping Use   Vaping Use: Never used  Substance Use Topics   Alcohol use: No    Comment: Rare   Drug use: No    Home  Medications Prior to Admission medications   Medication Sig Start Date End Date Taking? Authorizing Provider  afatinib dimaleate (GILOTRIF) 30 MG tablet TAKE 1 TABLET (30MG ) BY MOUTH ONCE DAILY. TAKE ON AN EMPTY STOMACH 1 HOUR BEFORE OR 2 HOURS AFTER A MEAL Patient taking differently: Take 30 mg by mouth at bedtime. TAKE ON AN EMPTY STOMACH 1 HOUR BEFORE OR 2 HOURS AFTER A MEAL 04/18/21 04/18/22  Curt Bears, MD  ALPRAZolam Duanne Moron) 1 MG tablet Take 1 mg by mouth at bedtime.    [provider]  aspirin 81 MG tablet Take 81 mg by mouth at bedtime.     [provider]  ezetimibe (ZETIA) 10 MG tablet Take 1 tablet (10 mg total) by mouth daily. 05/18/21 05/18/22  Liane Comber, NP  hydrochlorothiazide (HYDRODIURIL) 25 MG tablet Take 25 mg by mouth daily. 03/25/21   [provider]  ibuprofen (ADVIL,MOTRIN) 200 MG tablet Take 400 mg by mouth every 6 (six) hours as needed for headache or moderate pain.    [provider]  levothyroxine (SYNTHROID) 50 MCG tablet TAKE 1 TABLET BY MOUTH DAILY ON AN EMPTY STOMACH WITH ONLY WATER FOR 30 MINUTES. NO ANTACID, CALCIUM, OR MAGNESIUM FOR 4 HOURS. AVOID BIOTIN Patient taking differently: Take 50 mcg by mouth daily before breakfast. NO ANTACID, CALCIUM, OR MAGNESIUM FOR 4 HOURS. AVOID BIOTIN 12/30/20   Liane Comber, NP  loratadine-pseudoephedrine (CLARITIN-D 12-HOUR) 5-120 MG tablet Take 1 tablet by mouth 2 (two) times daily as needed for allergies.    [provider]  Melatonin 10 MG CAPS Take 10 mg by mouth at bedtime.    [provider]  montelukast (SINGULAIR) 10 MG tablet TAKE 1 TABLET BY MOUTH DAILY FOR ALLERGIES 06/28/21   Liane Comber, NP  Promethazine-Codeine 6.25-10 MG/5ML SOLN TAKE 5 TO 10 ML BY MOUTH EVERY 4 HOURS AS NEEDED FOR COUGH OR CONGESTION Patient taking differently: Take 5-10 mLs by mouth every 4 (four) hours as needed (cough/congestion). 07/03/20   Liane Comber, NP    Allergies     Penicillins  Review of Systems   Review of Systems  Constitutional:  Negative for chills and fever.  HENT:  Negative for facial swelling and trouble swallowing.   Eyes:  Negative for photophobia and visual disturbance.  Respiratory:  Negative for cough and shortness of breath.   Cardiovascular:  Positive for chest pain. Negative for palpitations.  Gastrointestinal:  Negative for abdominal pain, nausea and vomiting.  Endocrine: Negative for polydipsia and polyuria.  Genitourinary:  Negative for difficulty urinating and hematuria.  Musculoskeletal:  Negative for gait problem and joint swelling.  Skin:  Negative for pallor and rash.  Neurological:  Negative for syncope and headaches.  Psychiatric/Behavioral:  Negative for agitation and confusion.    Physical Exam Updated Vital Signs BP (!) 161/79 (BP  Location: Right Arm)   Pulse 87   Temp 97.9 F (36.6 C) (Oral)   Resp 18   Ht 5' (1.524 m)   Wt 74.8 kg   SpO2 100%   BMI 32.22 kg/m   Physical Exam Vitals and nursing note reviewed.  Constitutional:      General: She is not in acute distress.    Appearance: Normal appearance.  HENT:     Head: Normocephalic and atraumatic.     Right Ear: External ear normal.     Left Ear: External ear normal.     Nose: Nose normal.     Mouth/Throat:     Mouth: Mucous membranes are moist.  Eyes:     General: No scleral icterus.       Right eye: No discharge.        Left eye: No discharge.  Cardiovascular:     Rate and Rhythm: Normal rate and regular rhythm.     Pulses: Normal pulses.          Radial pulses are 2+ on the right side and 2+ on the left side.       Posterior tibial pulses are 2+ on the right side and 2+ on the left side.     Heart sounds: Normal heart sounds.  Pulmonary:     Effort: Pulmonary effort is normal. No respiratory distress.     Breath sounds: Normal breath sounds.  Abdominal:     General: Abdomen is flat.     Tenderness: There is no abdominal tenderness.   Musculoskeletal:        General: Normal range of motion.     Cervical back: Normal range of motion.     Right lower leg: No edema.     Left lower leg: No edema.  Skin:    General: Skin is warm and dry.     Capillary Refill: Capillary refill takes less than 2 seconds.  Neurological:     Mental Status: She is alert.  Psychiatric:        Mood and Affect: Mood normal.        Behavior: Behavior normal.    ED Results / Procedures / Treatments   Labs (all labs ordered are listed, but only abnormal results are displayed) Labs Reviewed  RESP PANEL BY RT-PCR (FLU A&B, COVID) [id number]  BASIC METABOLIC PANEL  CBC  TSH  T4, FREE  D-DIMER, QUANTITATIVE  TROPONIN I (HIGH SENSITIVITY)  TROPONIN I (HIGH SENSITIVITY)    EKG EKG Interpretation  Date/Time:  Tuesday July 05 2021 17:32:49 EDT Ventricular Rate:  93 PR Interval:  152 QRS Duration: 70 QT Interval:  372 QTC Calculation: 462 R Axis:   31 Text Interpretation: Normal sinus rhythm Low voltage QRS Borderline ECG similar to prior tracing Confirmed by Wynona Dove [phone]) on 07/05/2021 8:46:27 PM  Radiology DG Chest 2 View  Result Date: 07/05/2021 CLINICAL DATA:  Chest pain EXAM: CHEST - 2 VIEW COMPARISON:  05/13/2015 FINDINGS: Heart is normal size. No confluent airspace opacities, effusions or edema. No acute bony abnormality. IMPRESSION: No active cardiopulmonary disease. Electronically Signed   By: Rolm Baptise M.D.   On: 07/05/2021 18:04    Procedures Procedures   Medications Ordered in ED Medications - No data to display  ED Course  I have reviewed the triage vital signs and the nursing notes.  Pertinent labs & imaging results that were available during my care of the patient were reviewed by me and considered in my medical  decision making (see chart for details).    MDM Rules/Calculators/A&P                           This patient complains of chest pain; this involves an extensive number of treatment options and  is a complaint that carries with it a high risk of complications and morbidity.  Vital signs reviewed and are stable.  No acute chest pain currently.  Serious etiologies considered.   Low risk Wells score, obtain D-dimer.  Labs reviewed and are stable.  Troponin is not acutely elevated.  EKG without evidence of acute ischemia.  Chest x-ray is nonacute.  However history and presenting symptoms are very concerning for anginal chest pain.  Given pain was exertional, resolved after nitroglycerin there is concern for anginal chest pain.  HEART score is moderate. At this time recommend admission for cardiology evaluation of presumed anginal chest pain.   Continue cardiac monitoring.  Patient took 324 mg of aspirin prior to arrival.  Discussed with [facility] who accepts patient for admission.   Final Clinical Impression(s) / ED Diagnoses Final diagnoses:  Anginal chest pain at rest [facility])  Moderate risk chest pain  History of lung cancer    Rx / DC Orders ED Discharge Orders     None        Jeanell Sparrow, DO 07/05/21 2127

## 2021-07-05 NOTE — ED Triage Notes (Signed)
Pt arrived via [facility],c/o chest pain of 9 out of 10. Pt took 4 baby Asprin before EMS arrived.  Pt.continued to have chest pain and was given 1 nitro tablet on the way to the ED which resolved the pain. The Pt remains pain free at this time.     Pt was pushing a cart at [facility] when the pain started.

## 2021-07-05 NOTE — ED Notes (Signed)
Charting of all assessments completed by this RN and not DH. Chart shows adjustments were made to all areas that  Epic allowed.Charge

## 2021-07-05 NOTE — ED Triage Notes (Addendum)
q 

## 2021-07-06 ENCOUNTER — Encounter (HOSPITAL_BASED_OUTPATIENT_CLINIC_OR_DEPARTMENT_OTHER): Payer: Self-pay | Admitting: Family Medicine

## 2021-07-06 ENCOUNTER — Inpatient Hospital Stay: Payer: Medicare Other

## 2021-07-06 ENCOUNTER — Inpatient Hospital Stay: Payer: Medicare Other | Admitting: Internal Medicine

## 2021-07-06 ENCOUNTER — Emergency Department (HOSPITAL_BASED_OUTPATIENT_CLINIC_OR_DEPARTMENT_OTHER): Payer: Medicare Other

## 2021-07-06 LAB — T4, FREE: Free T4: 1.38 ng/dL — ABNORMAL HIGH (ref 0.61–1.12)

## 2021-07-06 LAB — TSH: TSH: 2.018 u[IU]/mL (ref 0.350–4.500)

## 2021-07-06 MED ORDER — IOHEXOL 350 MG/ML SOLN
60.0000 mL | Freq: Once | INTRAVENOUS | Status: AC | PRN
  Administered 2021-07-06: 60 mL via INTRAVENOUS

## 2021-07-06 NOTE — ED Provider Notes (Signed)
  Physical Exam  BP (!) 114/57   Pulse 80   Temp 97.9 F (36.6 C) (Oral)   Resp 20   Ht 5' (1.524 m)   Wt 74.8 kg   SpO2 91%   BMI 32.22 kg/m   Physical Exam  ED Course/Procedures     Procedures  MDM   75 year old female with a history of adenocarcinoma of the lung, hypertension, hyperlipidemia, prediabetes, thyroid disease, who presented with chest pain last night.  She has been awaiting admission to [facility] in the [facility] emergency department.  She describes having low chest pain with some radiation to her shoulder and her back with some nausea while she was walking around [place].  She received nitroglycerin with EMS with complete resolution of her chest pain.  Denies any other exertional chest pain prior to this, denies any return of chest pain overnight.  Notes she did have a cookie prior to pain starting and question reflux.  She is able to ambulate to the bathroom without experiencing chest pain at this time and states she would like to leave.    DDimer positive last night and will obtain CT PE study in light of cancer hx, positive ddimer.  Normal pulses bilaterally, resolution of pain with nitroglycerin makes dissection less likely. CT PE study without PE or other acute abnormalities.   Recommend close Cardiology follow up, return for any new or worsening symptoms.        Gareth Morgan, MD 07/06/21 [phone]

## 2021-07-19 ENCOUNTER — Other Ambulatory Visit: Payer: Self-pay | Admitting: Internal Medicine

## 2021-07-20 ENCOUNTER — Other Ambulatory Visit (HOSPITAL_COMMUNITY): Payer: Self-pay

## 2021-07-20 MED ORDER — AFATINIB DIMALEATE 30 MG PO TABS
ORAL_TABLET | ORAL | 2 refills | Status: DC
  Filled 2021-07-20: qty 30, 30d supply, fill #0
  Filled 2021-07-20: qty 30, fill #0
  Filled 2021-08-16: qty 30, 30d supply, fill #1
  Filled 2021-09-19: qty 30, 30d supply, fill #2

## 2021-07-20 NOTE — Progress Notes (Addendum)
COVID swab appointment: N/a  COVID Vaccine Completed: yes x4 Date COVID Vaccine completed: 12/05/19, 01/03/20 Has received booster: 07/26/20, 05/16/21 COVID vaccine manufacturer:   Moderna     Date of COVID positive in last 90 days: No  PCP - [name], MD Cardiologist - Dr. Chelsea Aus seeing in Nov  Chest x-ray - 07/05/21 Epic EKG - 07/11/21 Epic Stress Test - yes years ago per pt ECHO - N/a Cardiac Cath - N/a Pacemaker/ICD device last checked: N/a Spinal Cord Stimulator: N/a  Sleep Study - N/a CPAP -   Fasting Blood Sugar -  pre DM Checks Blood Sugar _____ times a day  Blood Thinner Instructions: Aspirin Instructions: ASA 81mg , no instructions pt will call PCP Last Dose:  Activity level: Can go up a flight of stairs and perform activities of daily living without stopping and without symptoms of chest pain. SOB all the time due to lung cancer    Anesthesia review:   Patient denies shortness of breath, fever, cough and chest pain at PAT appointment   Patient verbalized understanding of instructions that were given to them at the PAT appointment. Patient was also instructed that they will need to review over the PAT instructions again at home before surgery.

## 2021-07-20 NOTE — Patient Instructions (Addendum)
DUE TO COVID-19 ONLY ONE VISITOR IS ALLOWED TO COME WITH YOU AND STAY IN THE WAITING ROOM ONLY DURING PRE OP AND PROCEDURE.   **NO VISITORS ARE ALLOWED IN THE SHORT STAY AREA OR RECOVERY ROOM!!**       Your procedure is scheduled on: 08/11/21   Report to [facility] Main Entrance    Report to admitting at 6:45 AM   Call this number if you have problems the morning of surgery [phone]   Do not eat food :After Midnight.   May have liquids until 6:00 AM day of surgery  CLEAR LIQUID DIET  Foods Allowed                                                                     Foods Excluded  Water, Black Coffee and tea (no milk or creamer)           liquids that you cannot  Plain Jell-O in any flavor  (No red)                                    see through such as: Fruit ices (not with fruit pulp)                                           milk, soups, orange juice              Iced Popsicles (No red)                                              All solid food                                   Apple juices Sports drinks like Gatorade (No red) Lightly seasoned clear broth or consume(fat free) Sugar     The day of surgery:  Drink ONE (1) Pre-Surgery G2 by 6:00 am the morning of surgery. Drink in one sitting. Do not sip.  This drink was given to you during your hospital  pre-op appointment visit. Nothing else to drink after completing the  Pre-Surgery G2.          If you have questions, please contact your surgeon's office.     Oral Hygiene is also important to reduce your risk of infection.                                    Remember - BRUSH YOUR TEETH THE MORNING OF SURGERY WITH YOUR REGULAR TOOTHPASTE   Take these medicines the morning of surgery with A SIP OF WATER: Zetia, Synthroid, Singulair                              You may not have  any metal on your body including hair pins, jewelry, and body piercing             Do not wear make-up, lotions, powders, perfumes,  or deodorant  Do not wear nail polish including gel and S&S, artificial/acrylic nails, or any other type of covering on natural nails including finger and toenails. If you have artificial nails, gel coating, etc. that needs to be removed by a nail salon please have this removed prior to surgery or surgery may need to be canceled/ delayed if the surgeon/ anesthesia feels like they are unable to be safely monitored.   Do not shave  48 hours prior to surgery.    Do not bring valuables to the hospital. [place].   Please call primary care provider and ask for instructions regarding Aspirin before surgery.    Patients discharged on the day of surgery will not be allowed to drive home.  Special Instructions: Bring a copy of your healthcare power of attorney and living will documents         the day of surgery if you haven't scanned them before.   Please read over the following fact sheets you were given: IF YOU HAVE QUESTIONS ABOUT YOUR PRE-OP INSTRUCTIONS PLEASE CALL [facility] - Preparing for Surgery Before surgery, you can play an important role.  Because skin is not sterile, your skin needs to be as free of germs as possible.  You can reduce the number of germs on your skin by washing with CHG (chlorahexidine gluconate) soap before surgery.  CHG is an antiseptic cleaner which kills germs and bonds with the skin to continue killing germs even after washing. Please DO NOT use if you have an allergy to CHG or antibacterial soaps.  If your skin becomes reddened/irritated stop using the CHG and inform your nurse when you arrive at Short Stay. Do not shave (including legs and underarms) for at least 48 hours prior to the first CHG shower.  You may shave your face/neck.  Please follow these instructions carefully:  1.  Shower with CHG Soap the night before surgery and the  morning of surgery.  2.  If you choose to wash your  hair, wash your hair first as usual with your normal  shampoo.  3.  After you shampoo, rinse your hair and body thoroughly to remove the shampoo.                             4.  Use CHG as you would any other liquid soap.  You can apply chg directly to the skin and wash.  Gently with a scrungie or clean washcloth.  5.  Apply the CHG Soap to your body ONLY FROM THE NECK DOWN.   Do   not use on face/ open                           Wound or open sores. Avoid contact with eyes, ears mouth and   genitals (private parts).                       Wash face,  Genitals (private parts) with your normal soap.             6.  Wash thoroughly, paying special attention to the area where your    surgery  will be performed.  7.  Thoroughly rinse your body with warm water from the neck down.  8.  DO NOT shower/wash with your normal soap after using and rinsing off the CHG Soap.                9.  Pat yourself dry with a clean towel.            10.  Wear clean pajamas.            11.  Place clean sheets on your bed the night of your first shower and do not  sleep with pets. Day of Surgery : Do not apply any lotions/deodorants the morning of surgery.  Please wear clean clothes to the hospital/surgery center.  FAILURE TO FOLLOW THESE INSTRUCTIONS MAY RESULT IN THE CANCELLATION OF YOUR SURGERY  PATIENT SIGNATURE_________________________________  NURSE SIGNATURE__________________________________  ________________________________________________________________________   [facility] - Preparing for Surgery Before surgery, you can play an important role.  Because skin is not sterile, your skin needs to be as free of germs as possible.  You can reduce the number of germs on your skin by washing with CHG (chlorahexidine gluconate) soap before surgery.  CHG is an antiseptic cleaner which kills germs and bonds with the skin to continue killing germs even after washing. Please DO NOT use if you have an allergy to CHG or  antibacterial soaps.  If your skin becomes reddened/irritated stop using the CHG and inform your nurse when you arrive at Short Stay. Do not shave (including legs and underarms) for at least 48 hours prior to the first CHG shower.  You may shave your face/neck.  Please follow these instructions carefully:  1.  Shower with CHG Soap the night before surgery and the  morning of surgery.  2.  If you choose to wash your hair, wash your hair first as usual with your normal  shampoo.  3.  After you shampoo, rinse your hair and body thoroughly to remove the shampoo.                             4.  Use CHG as you would any other liquid soap.  You can apply chg directly to the skin and wash.  Gently with a scrungie or clean washcloth.  5.  Apply the CHG Soap to your body ONLY FROM THE NECK DOWN.   Do   not use on face/ open                           Wound or open sores. Avoid contact with eyes, ears mouth and   genitals (private parts).                       Wash face,  Genitals (private parts) with your normal soap.             6.  Wash thoroughly, paying special attention to the area where your    surgery  will be performed.  7.  Thoroughly rinse your body with warm water from the neck down.  8.  DO NOT shower/wash with your normal soap after using and rinsing off the CHG Soap.                9.  Pat yourself dry  with a clean towel.            10.  Wear clean pajamas.            11.  Place clean sheets on your bed the night of your first shower and do not  sleep with pets. Day of Surgery : Do not apply any lotions/deodorants the morning of surgery.  Please wear clean clothes to the hospital/surgery center.  FAILURE TO FOLLOW THESE INSTRUCTIONS MAY RESULT IN THE CANCELLATION OF YOUR SURGERY  PATIENT SIGNATURE_________________________________  NURSE SIGNATURE__________________________________  ________________________________________________________________________

## 2021-07-21 ENCOUNTER — Other Ambulatory Visit (HOSPITAL_COMMUNITY): Payer: Self-pay

## 2021-07-21 ENCOUNTER — Encounter (HOSPITAL_COMMUNITY)
Admission: RE | Admit: 2021-07-21 | Discharge: 2021-07-21 | Disposition: A | Discharge status: Home / Self Care | Payer: Medicare Other | Source: Ambulatory Visit | Attending: Surgery | Admitting: Surgery

## 2021-07-21 ENCOUNTER — Encounter (HOSPITAL_COMMUNITY): Payer: Self-pay

## 2021-07-21 ENCOUNTER — Other Ambulatory Visit: Payer: Self-pay

## 2021-07-21 DIAGNOSIS — Z01812 Encounter for preprocedural laboratory examination: Secondary | ICD-10-CM | POA: Insufficient documentation

## 2021-07-21 HISTORY — DX: Hypothyroidism, unspecified: E03.9

## 2021-07-21 LAB — CBC
HCT: 38.9 % (ref 36.0–46.0)
Hemoglobin: 12.5 g/dL (ref 12.0–15.0)
MCH: 28.2 pg (ref 26.0–34.0)
MCHC: 32.1 g/dL (ref 30.0–36.0)
MCV: 87.8 fL (ref 80.0–100.0)
Platelets: 250 10*3/uL (ref 150–400)
RBC: 4.43 MIL/uL (ref 3.87–5.11)
RDW: 13.3 % (ref 11.5–15.5)
WBC: 5.1 10*3/uL (ref 4.0–10.5)
nRBC: 0 % (ref 0.0–0.2)

## 2021-07-21 LAB — COMPREHENSIVE METABOLIC PANEL
ALT: 27 U/L (ref 0–44)
AST: 33 U/L (ref 15–41)
Albumin: 3.9 g/dL (ref 3.5–5.0)
Alkaline Phosphatase: 88 U/L (ref 38–126)
Anion gap: 7 (ref 5–15)
BUN: 14 mg/dL (ref 8–23)
CO2: 27 mmol/L (ref 22–32)
Calcium: 9 mg/dL (ref 8.9–10.3)
Chloride: 105 mmol/L (ref 98–111)
Creatinine, Ser: 1.22 mg/dL — ABNORMAL HIGH (ref 0.44–1.00)
GFR, Estimated: 47 mL/min — ABNORMAL LOW (ref >60)
Glucose, Bld: 82 mg/dL (ref 70–99)
Potassium: 4.4 mmol/L (ref 3.5–5.1)
Sodium: 139 mmol/L (ref 135–145)
Total Bilirubin: 0.9 mg/dL (ref 0.3–1.2)
Total Protein: 7.4 g/dL (ref 6.5–8.1)

## 2021-07-21 LAB — GLUCOSE, CAPILLARY: Glucose-Capillary: 88 mg/dL (ref 70–99)

## 2021-07-22 ENCOUNTER — Encounter: Payer: Self-pay | Admitting: Radiology

## 2021-07-22 NOTE — Progress Notes (Incomplete)
  Radiation Oncology         [phone] ________________________________  Patient Name: Elizabeth [name] MRN: [id number] DOB: 1946/04/23 Referring Physician: [name] (Profile Not Attached) Date of Service: 06/21/2021 [facility], [place]  End Of Treatment Note  Diagnoses: C78.02-Secondary malignant neoplasm of left lung  Cancer Staging: The encounter diagnosis was Adenocarcinoma of left lung, stage 4 ([place]).   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 She reports having skin irritation defined by a large red blistered area on left breast. Patient reported this area to drain a yellow discharge. Also seen was a smaller red area to her right breast which the patient reported as being painful and itchy. She is currently using neosporin for this.  Patient reports this is likely related to some insect bites or possibly spider bites. This reaction does not appear related to her radiation therapy.  Patient has been given samples of triple antibiotic ointment to place on these areas  Plan: The patient will follow-up with radiation oncology in one month.  ________________________________________________ -----------------------------------  Blair Promise, PhD, MD  This document serves as a record of services personally performed by Gery Pray, MD. It was created on his behalf by Roney Mans, a trained medical scribe. The creation of this record is based on the scribe's  personal observations and the provider's statements to them. This document has been checked and approved by the attending provider.

## 2021-07-23 LAB — HEMOGLOBIN A1C
Hgb A1c MFr Bld: 5.7 % — ABNORMAL HIGH (ref 4.8–5.6)
Mean Plasma Glucose: 117 mg/dL

## 2021-07-23 NOTE — Progress Notes (Signed)
Radiation Oncology         [phone] ________________________________  Name: Elizabeth [name] MRN: [id number]  Date: 07/25/2021  DOB: Jun 20, 1946  Follow-Up Visit Note  CC: [name], MD  [name], MD    ICD-10-CM   1. Adenocarcinoma of left lung, stage 4 ([facility])  C34.92       Diagnosis: The encounter diagnosis was Adenocarcinoma of left lung, stage 4 ([place]).   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 The patient tolerated radiation relatively well with the exception of skin irritation defined by a large red blistered area on left breast with yellow discharge, and a smaller red area to her right breast which the patient reported as being painful and itchy (likley related to insect bites).   These areas were actually unrelated to her radiation therapy.  With SBRT she would not have any significant skin reaction since this radiation is focused along the target area within the chest.       Since completion of her treatment, the patient presented to the ED on 07/05/21 with substernal chest pain radiating to her back. Patient reported the pain to be worsened with exertion and progressively worsened until she was given aspirin-nitroglycerin by EMS just prior to arrival. Her pain was noted to have resolved following nitroglycerin. Labs reviewed were stable and her Troponin was not acutely elevated.  EKG showed no evidence of acute ischemia and  chest x-ray performed showed no acute findings. Given that the patient's chest pain was exertional, she was admitted for concern of anginal chest pain and for evaluation by cardiology. Angio Chest CT performed on 07/06/21 was negative for PE or other acute findings. (Unsure of discharge date).     Of note: the patient is planned to undergo laparoscopic biopsy of retroperitoneum mass (inside left hip bone) on 08/11/21 under Dr. Johney Maine.   She denies any significant breathing issues since completion of her SBRT treatments.  She has chronic dyspnea upon exertion.  Allergies:  is allergic to penicillins.  Meds: Current Outpatient Medications  Medication Sig Dispense Refill   afatinib dimaleate (GILOTRIF) 30 MG tablet TAKE 1 TABLET (30MG) BY MOUTH ONCE DAILY. TAKE ON AN EMPTY STOMACH 1 HOUR BEFORE OR 2 HOURS AFTER A MEAL 30 tablet 2   ALPRAZolam (XANAX) 1 MG tablet Take 1 mg by mouth at bedtime.     aspirin 81 MG tablet Take 81 mg by mouth at bedtime.      ezetimibe (ZETIA) 10 MG tablet Take 1 tablet (10 mg total) by mouth daily. 90 tablet 3   hydrochlorothiazide (HYDRODIURIL) 25 MG tablet Take 25 mg by mouth daily.     ibuprofen (ADVIL,MOTRIN) 200 MG tablet Take 400 mg by mouth every 6 (six) hours as needed for headache or moderate pain.     levothyroxine (SYNTHROID) 50 MCG tablet TAKE 1 TABLET BY MOUTH DAILY ON AN EMPTY STOMACH WITH ONLY WATER FOR 30 MINUTES. NO ANTACID, CALCIUM, OR MAGNESIUM FOR  4 HOURS. AVOID BIOTIN (Patient taking differently: Take 50 mcg by mouth daily before breakfast. NO ANTACID, CALCIUM, OR MAGNESIUM FOR 4 HOURS. AVOID BIOTIN) 90 tablet 3   loratadine-pseudoephedrine (CLARITIN-D 12-HOUR) 5-120 MG tablet Take 1 tablet by mouth 2 (two) times daily as needed for allergies.     melatonin 5 MG TABS Take 5 mg by mouth at bedtime.     montelukast (SINGULAIR) 10 MG tablet TAKE 1 TABLET BY MOUTH DAILY FOR ALLERGIES 90 tablet 3   No current facility-administered medications for this  encounter.    Physical Findings: The patient is in no acute distress. Patient is alert and oriented.  height is 5' (1.524 m) and weight is 161 lb (73 kg). Her temperature is 97.6 F (36.4 C). Her blood pressure is 141/71 (abnormal) and her pulse is 88. Her respiration is 18 and oxygen saturation is 100%. .  No significant changes. Lungs are clear to auscultation bilaterally. Heart has regular rate and rhythm. No palpable cervical, supraclavicular, or axillary adenopathy. Abdomen soft, non-tender, normal bowel sounds.    Lab Findings: Lab Results  Component Value Date   WBC 5.1 07/25/2021   HGB 11.9 (L) 07/25/2021   HCT 36.7 07/25/2021   MCV 85.5 07/25/2021   PLT 224 07/25/2021    Radiographic Findings: DG Chest 2 View  Result Date: 07/05/2021 CLINICAL DATA:  Chest pain EXAM: CHEST - 2 VIEW COMPARISON:  05/13/2015 FINDINGS: Heart is normal size. No confluent airspace opacities, effusions or edema. No acute bony abnormality. IMPRESSION: No active cardiopulmonary disease. Electronically Signed   By: Rolm Baptise M.D.   On: 07/05/2021 18:04   CT Angio Chest PE W and/or Wo Contrast  Result Date: 07/06/2021 CLINICAL DATA:  Lung cancer history. Chest pain radiating to the back that started yesterday EXAM: CT ANGIOGRAPHY CHEST WITH CONTRAST TECHNIQUE: Multidetector CT imaging of the chest was performed using the standard protocol during bolus administration of intravenous contrast. Multiplanar CT image reconstructions and MIPs were obtained to evaluate the vascular anatomy. CONTRAST:  47m OMNIPAQUE IOHEXOL 350 MG/ML SOLN COMPARISON:  04/22/2021 PET-CT FINDINGS: Cardiovascular: Satisfactory opacification of the pulmonary arteries to the segmental level. No evidence of pulmonary embolism. Normal heart size. No pericardial effusion. Mediastinum/Nodes: Small sliding hiatal hernia. No adenopathy or mass. Lungs/Pleura: Subpleural left upper lobe wedge of opacity which was hypermetabolic on prior.  Indistinct opacity with volume loss at the left base. Patchy air trapping and reticulation. No interval change since July 2022 PET-CT. Upper Abdomen: No acute finding Musculoskeletal: Soft tissue nodule seen on prior CT is not seen/covered. No acute or aggressive finding. Review of the MIP images confirms the above findings. IMPRESSION: 1. Negative for pulmonary embolism or other acute finding. 2. No adverse finding since a July 2022 PET-CT. Electronically Signed   By: JJorje GuildM.D.   On: 07/06/2021 07:57    Impression:  The encounter diagnosis was Adenocarcinoma of left lung, stage 4 ([place].   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  Plan: As needed follow-up in radiation oncology.  She will continue close follow-up with Dr. MJulien Nordmann  As above she can proceed with laparoscopic biopsy/removal of the left-sided pelvic mass    ____________________________________  JBlair Promise PhD,  MD   This document serves as a record of services personally performed by Gery Pray, MD. It was created on his behalf by Roney Mans, a trained medical scribe. The creation of this record is based on the scribe's personal observations and the provider's statements to them. This document has been checked and approved by the attending provider.

## 2021-07-25 ENCOUNTER — Inpatient Hospital Stay (HOSPITAL_BASED_OUTPATIENT_CLINIC_OR_DEPARTMENT_OTHER): Payer: Medicare Other | Admitting: Internal Medicine

## 2021-07-25 ENCOUNTER — Encounter: Payer: Self-pay | Admitting: Radiation Oncology

## 2021-07-25 ENCOUNTER — Other Ambulatory Visit: Payer: Self-pay

## 2021-07-25 ENCOUNTER — Inpatient Hospital Stay: Payer: Medicare Other | Attending: Internal Medicine

## 2021-07-25 ENCOUNTER — Ambulatory Visit
Admission: RE | Admit: 2021-07-25 | Discharge: 2021-07-25 | Disposition: A | Discharge status: Home / Self Care | Payer: Medicare Other | Source: Ambulatory Visit | Attending: Radiation Oncology | Admitting: Radiation Oncology

## 2021-07-25 VITALS — BP 141/71 | HR 88 | Temp 97.6°F | Resp 18 | Ht 60.0 in | Wt 161.0 lb

## 2021-07-25 VITALS — BP 152/87 | HR 87 | Temp 97.7°F | Resp 19 | Ht 60.0 in | Wt 161.2 lb

## 2021-07-25 DIAGNOSIS — I10 Essential (primary) hypertension: Secondary | ICD-10-CM | POA: Insufficient documentation

## 2021-07-25 DIAGNOSIS — C7802 Secondary malignant neoplasm of left lung: Secondary | ICD-10-CM | POA: Insufficient documentation

## 2021-07-25 DIAGNOSIS — E039 Hypothyroidism, unspecified: Secondary | ICD-10-CM | POA: Insufficient documentation

## 2021-07-25 DIAGNOSIS — K449 Diaphragmatic hernia without obstruction or gangrene: Secondary | ICD-10-CM | POA: Insufficient documentation

## 2021-07-25 DIAGNOSIS — C3432 Malignant neoplasm of lower lobe, left bronchus or lung: Secondary | ICD-10-CM | POA: Diagnosis present

## 2021-07-25 DIAGNOSIS — Z5111 Encounter for antineoplastic chemotherapy: Secondary | ICD-10-CM

## 2021-07-25 DIAGNOSIS — R079 Chest pain, unspecified: Secondary | ICD-10-CM | POA: Diagnosis not present

## 2021-07-25 DIAGNOSIS — C3492 Malignant neoplasm of unspecified part of left bronchus or lung: Secondary | ICD-10-CM

## 2021-07-25 DIAGNOSIS — Z880 Allergy status to penicillin: Secondary | ICD-10-CM | POA: Insufficient documentation

## 2021-07-25 DIAGNOSIS — Z79899 Other long term (current) drug therapy: Secondary | ICD-10-CM | POA: Diagnosis not present

## 2021-07-25 DIAGNOSIS — R197 Diarrhea, unspecified: Secondary | ICD-10-CM | POA: Diagnosis not present

## 2021-07-25 DIAGNOSIS — R0609 Other forms of dyspnea: Secondary | ICD-10-CM | POA: Diagnosis not present

## 2021-07-25 DIAGNOSIS — E785 Hyperlipidemia, unspecified: Secondary | ICD-10-CM | POA: Insufficient documentation

## 2021-07-25 DIAGNOSIS — Z923 Personal history of irradiation: Secondary | ICD-10-CM | POA: Insufficient documentation

## 2021-07-25 DIAGNOSIS — R21 Rash and other nonspecific skin eruption: Secondary | ICD-10-CM | POA: Insufficient documentation

## 2021-07-25 DIAGNOSIS — F419 Anxiety disorder, unspecified: Secondary | ICD-10-CM | POA: Insufficient documentation

## 2021-07-25 DIAGNOSIS — Z7982 Long term (current) use of aspirin: Secondary | ICD-10-CM | POA: Diagnosis not present

## 2021-07-25 DIAGNOSIS — Z90722 Acquired absence of ovaries, bilateral: Secondary | ICD-10-CM | POA: Diagnosis not present

## 2021-07-25 LAB — CBC WITH DIFFERENTIAL (CANCER CENTER ONLY)
Abs Immature Granulocytes: 0.02 10*3/uL (ref 0.00–0.07)
Basophils Absolute: 0 10*3/uL (ref 0.0–0.1)
Basophils Relative: 1 %
Eosinophils Absolute: 0.2 10*3/uL (ref 0.0–0.5)
Eosinophils Relative: 3 %
HCT: 36.7 % (ref 36.0–46.0)
Hemoglobin: 11.9 g/dL — ABNORMAL LOW (ref 12.0–15.0)
Immature Granulocytes: 0 %
Lymphocytes Relative: 18 %
Lymphs Abs: 0.9 10*3/uL (ref 0.7–4.0)
MCH: 27.7 pg (ref 26.0–34.0)
MCHC: 32.4 g/dL (ref 30.0–36.0)
MCV: 85.5 fL (ref 80.0–100.0)
Monocytes Absolute: 0.5 10*3/uL (ref 0.1–1.0)
Monocytes Relative: 10 %
Neutro Abs: 3.5 10*3/uL (ref 1.7–7.7)
Neutrophils Relative %: 68 %
Platelet Count: 224 10*3/uL (ref 150–400)
RBC: 4.29 MIL/uL (ref 3.87–5.11)
RDW: 13.3 % (ref 11.5–15.5)
WBC Count: 5.1 10*3/uL (ref 4.0–10.5)
nRBC: 0 % (ref 0.0–0.2)

## 2021-07-25 LAB — CMP (CANCER CENTER ONLY)
ALT: 27 U/L (ref 0–44)
AST: 36 U/L (ref 15–41)
Albumin: 3.5 g/dL (ref 3.5–5.0)
Alkaline Phosphatase: 82 U/L (ref 38–126)
Anion gap: 9 (ref 5–15)
BUN: 13 mg/dL (ref 8–23)
CO2: 25 mmol/L (ref 22–32)
Calcium: 9 mg/dL (ref 8.9–10.3)
Chloride: 106 mmol/L (ref 98–111)
Creatinine: 0.9 mg/dL (ref 0.44–1.00)
GFR, Estimated: >60 mL/min (ref >60)
Glucose, Bld: 84 mg/dL (ref 70–99)
Potassium: 4.1 mmol/L (ref 3.5–5.1)
Sodium: 140 mmol/L (ref 135–145)
Total Bilirubin: 0.9 mg/dL (ref 0.3–1.2)
Total Protein: 6.8 g/dL (ref 6.5–8.1)

## 2021-07-25 NOTE — Progress Notes (Signed)
Elizabeth [name] is here today for follow up post radiation to the lung.  Lung Side: left, patient completed treatment on 06/21/21  Does the patient complain of any of the following: Pain:Patient denies pain. Shortness of breath w/wo exertion:  yes, on exertion. Cough: yes, patient reports having a dry cough.  Hemoptysis: no Pain with swallowing: no Swallowing/choking concerns: Patient reports choking concerns have improved. Patient reports going to the emergency room  on 07/06/21 due to feeling like she had a lump in her chest and getting choked on her foods.  Appetite: good Energy Level: Patient reports having a good energy level.  Post radiation skin Changes: Patient reports skin is much better continues to have  mild peeling to left chest area.  Medical oncology follow up: Patient was seen today by Dr. Julien Nordmann.      Additional comments if applicable:  Vitals:   97/02/63 1148  BP: (!) 141/71  Pulse: 88  Resp: 18  Temp: 97.6 F (36.4 C)  SpO2: 100%  Weight: 161 lb (73 kg)  Height: 5' (1.524 m)

## 2021-07-25 NOTE — Progress Notes (Signed)
[facility] [phone]   [phone]  OFFICE PROGRESS NOTE  [name], MD [address] [place] [address]  DIAGNOSIS: Stage IV (T2a, N0, M1a) non-small cell lung cancer, adenocarcinoma with positive EGFR mutation with deletion in exon 19 diagnosed in June 2016 and presented with large mass in the left lower lobe in addition to multiple bilateral pulmonary nodules  PRIOR THERAPY:  1) Gilotrif 40 mg by mouth daily started 05/11/2015, status post 9 months of treatment discontinued on 02/17/2016 secondary to extensive skin rash. 2) SBRT to the enlarging left upper lobe nodule under the care of Dr. Lisbeth Renshaw.  CURRENT THERAPY: Gilotrif 30 mg by mouth daily started 02/21/2016 status post 65 months of treatment.  INTERVAL HISTORY: Elizabeth [name] 75 y.o. female returns to the clinic today for follow-up visit.  The patient is feeling fine today with no concerning complaints.  She was seen at the emergency department 3 weeks ago complaining of substernal pain and she had cardiac work-up that was unremarkable.  She also had CT angiogram of the chest that showed no evidence for pulmonary embolism or any other acute findings.  It was likely secondary to esophageal spasm.  The patient denied having any current chest pain, shortness of breath, cough or hemoptysis.  She denied having any fever or chills.  She has no nausea, vomiting, diarrhea or constipation.  She has no headache or visual changes.  She has no recent weight loss or night sweats.  She is scheduled to follow-up with cardiology and gastroenterology for her condition.  She is also supposed to have surgical resection of the pelvic soft tissue mass on August 11, 2021.  She is here for evaluation and repeat blood work.  MEDICAL HISTORY: Past Medical History:  Diagnosis Date   Adenocarcinoma of left lung, stage 4 ([facility]) 04/05/2015   Biopsy confirmed CT SCAN: There are innumerable  bilateral pulmonary nodules. These range in size from about 5 mm to 2 cm. They demonstrate irregular indistinct borders, the larger ones demonstrating spiculation. PET SCAN: Diffuse pulmonary metastatic disease with a 4 cm dominant left lower low lung lesion. No enlarged or hypermetabolic mediastinal or hilar adenopathy.   Allergy    Anxiety    Cancer ([facility])    Change of skin related to chemotherapy 09/25/2017   Drug-induced skin rash 01/17/2016   History of radiation therapy 12/18/2019   SBRT left lung  12/11/2019-12/18/2019   Dr Gery Pray   History of radiation therapy 06/21/2021   left lung 06/14/2021-06/21/2021 Dr Gery Pray   Hyperlipidemia    Hypertension    Hypothyroidism    Insomnia    Prediabetes    Thyroid disease     ALLERGIES:  is allergic to penicillins.  MEDICATIONS:  Current Outpatient Medications  Medication Sig Dispense Refill   afatinib dimaleate (GILOTRIF) 30 MG tablet TAKE 1 TABLET (30MG) BY MOUTH ONCE DAILY. TAKE ON AN EMPTY STOMACH 1 HOUR BEFORE OR 2 HOURS AFTER A MEAL 30 tablet 2   ALPRAZolam (XANAX) 1 MG tablet Take 1 mg by mouth at bedtime.     aspirin 81 MG tablet Take 81 mg by mouth at bedtime.      ezetimibe (ZETIA) 10 MG tablet Take 1 tablet (10 mg total) by mouth daily. 90 tablet 3   hydrochlorothiazide (HYDRODIURIL) 25 MG tablet Take 25 mg by mouth daily.     ibuprofen (ADVIL,MOTRIN) 200 MG tablet Take 400 mg by mouth every 6 (  six) hours as needed for headache or moderate pain.     levothyroxine (SYNTHROID) 50 MCG tablet TAKE 1 TABLET BY MOUTH DAILY ON AN EMPTY STOMACH WITH ONLY WATER FOR 30 MINUTES. NO ANTACID, CALCIUM, OR MAGNESIUM FOR 4 HOURS. AVOID BIOTIN (Patient taking differently: Take 50 mcg by mouth daily before breakfast. NO ANTACID, CALCIUM, OR MAGNESIUM FOR 4 HOURS. AVOID BIOTIN) 90 tablet 3   loratadine-pseudoephedrine (CLARITIN-D 12-HOUR) 5-120 MG tablet Take 1 tablet by mouth 2 (two) times daily as needed for allergies.     melatonin 5 MG  TABS Take 5 mg by mouth at bedtime.     montelukast (SINGULAIR) 10 MG tablet TAKE 1 TABLET BY MOUTH DAILY FOR ALLERGIES 90 tablet 3   No current facility-administered medications for this visit.    SURGICAL HISTORY:  Past Surgical History:  Procedure Laterality Date   ABDOMINAL HYSTERECTOMY  10/23/1993   w BSO   CYST REMOVAL HAND Right    TONSILLECTOMY AND ADENOIDECTOMY      REVIEW OF SYSTEMS:  A comprehensive review of systems was negative.   PHYSICAL EXAMINATION: General appearance: alert, cooperative, and no distress Head: Normocephalic, without obvious abnormality, atraumatic Neck: no adenopathy, no JVD, supple, symmetrical, trachea midline, and thyroid not enlarged, symmetric, no tenderness/mass/nodules Lymph nodes: Cervical, supraclavicular, and axillary nodes normal. Resp: clear to auscultation bilaterally Back: symmetric, no curvature. ROM normal. No CVA tenderness. Cardio: regular rate and rhythm, S1, S2 normal, no murmur, click, rub or gallop GI: soft, non-tender; bowel sounds normal; no masses,  no organomegaly Extremities: extremities normal, atraumatic, no cyanosis or edema  ECOG PERFORMANCE STATUS: 1 - Symptomatic but completely ambulatory  Blood pressure (!) 152/87, pulse 87, temperature 97.7 F (36.5 C), temperature source Tympanic, resp. rate 19, height 5' (1.524 m), weight 161 lb 3.2 oz (73.1 kg), SpO2 100 %.  LABORATORY DATA: Lab Results  Component Value Date   WBC 5.1 07/25/2021   HGB 11.9 (L) 07/25/2021   HCT 36.7 07/25/2021   MCV 85.5 07/25/2021   PLT 224 07/25/2021      Chemistry      Component Value Date/Time   NA 139 07/21/2021 1138   NA 139 10/26/2017 1252   K 4.4 07/21/2021 1138   K 4.1 10/26/2017 1252   CL 105 07/21/2021 1138   CO2 27 07/21/2021 1138   CO2 28 10/26/2017 1252   BUN 14 07/21/2021 1138   BUN 15.7 10/26/2017 1252   CREATININE 1.22 (H) 07/21/2021 1138   CREATININE 0.94 05/25/2021 0840   CREATININE 0.83 05/18/2021 1213    CREATININE 1.0 10/26/2017 1252      Component Value Date/Time   CALCIUM 9.0 07/21/2021 1138   CALCIUM 8.8 10/26/2017 1252   ALKPHOS 88 07/21/2021 1138   ALKPHOS 70 10/26/2017 1252   AST 33 07/21/2021 1138   AST 27 05/25/2021 0840   AST 19 10/26/2017 1252   ALT 27 07/21/2021 1138   ALT 22 05/25/2021 0840   ALT 22 10/26/2017 1252   BILITOT 0.9 07/21/2021 1138   BILITOT 1.0 05/25/2021 0840   BILITOT 0.90 10/26/2017 1252       RADIOGRAPHIC STUDIES: DG Chest 2 View  Result Date: 07/05/2021 CLINICAL DATA:  Chest pain EXAM: CHEST - 2 VIEW COMPARISON:  05/13/2015 FINDINGS: Heart is normal size. No confluent airspace opacities, effusions or edema. No acute bony abnormality. IMPRESSION: No active cardiopulmonary disease. Electronically Signed   By: Rolm Baptise M.D.   On: 07/05/2021 18:04   CT Angio Chest PE  W and/or Wo Contrast  Result Date: 07/06/2021 CLINICAL DATA:  Lung cancer history. Chest pain radiating to the back that started yesterday EXAM: CT ANGIOGRAPHY CHEST WITH CONTRAST TECHNIQUE: Multidetector CT imaging of the chest was performed using the standard protocol during bolus administration of intravenous contrast. Multiplanar CT image reconstructions and MIPs were obtained to evaluate the vascular anatomy. CONTRAST:  47m OMNIPAQUE IOHEXOL 350 MG/ML SOLN COMPARISON:  04/22/2021 PET-CT FINDINGS: Cardiovascular: Satisfactory opacification of the pulmonary arteries to the segmental level. No evidence of pulmonary embolism. Normal heart size. No pericardial effusion. Mediastinum/Nodes: Small sliding hiatal hernia. No adenopathy or mass. Lungs/Pleura: Subpleural left upper lobe wedge of opacity which was hypermetabolic on prior. Indistinct opacity with volume loss at the left base. Patchy air trapping and reticulation. No interval change since July 2022 PET-CT. Upper Abdomen: No acute finding Musculoskeletal: Soft tissue nodule seen on prior CT is not seen/covered. No acute or aggressive  finding. Review of the MIP images confirms the above findings. IMPRESSION: 1. Negative for pulmonary embolism or other acute finding. 2. No adverse finding since a July 2022 PET-CT. Electronically Signed   By: JJorje GuildM.D.   On: 07/06/2021 07:57     ASSESSMENT AND PLAN:  This is a very pleasant 75years old white female with stage IV non-small cell lung cancer, adenocarcinoma with positive EGFR mutation with deletion in exon 19 and currently undergoing treatment with Gilotrif initially at a dose of 40 mg for 9 months followed by 30 mg by mouth daily status post 62 months.  She also underwent SBRT to the enlarging left upper lobe lung nodule. The patient continues to tolerate her treatment with Gilotrif fairly well except for mild intermittent diarrhea and rash in the scalp. She was found on recent imaging studies of the chest, abdomen pelvis to have suspicious nodules in the left lung as well as the retroperitoneum suspicious for disease progression. Her recent PET scan showed hypermetabolic subcutaneous nodule anterior to the left shoulder musculature concerning for cutaneous metastasis in addition to the enlarging nodule in the retroperitoneum anterior to the left iliac uKoreamuscle that also hypermetabolic and concerning for metastatic deposit with enlarging hypermetabolic nodule and consolidation peripherally in the left upper lobe concerning for lung cancer recurrence. 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 She is scheduled to have surgical resection of the soft tissue mass in the retroperitoneum on August 11, 2021. Her lab work is unremarkable today. I recommended for the patient to continue her current treatment with Gilotrif with the same dose. I will see her back for follow-up visit in 6 weeks for  evaluation and discussion of any treatment options based on the surgical biopsy results of the retroperitoneal soft tissue mass. The patient was advised to call immediately if she has any other concerning symptoms in the interval. The patient voices understanding of current disease status and treatment options and is in agreement with the current care plan. All questions were answered. The patient knows to call the clinic with any problems, questions or concerns. We can certainly see the patient much sooner if necessary.  Disclaimer: This note was dictated with voice recognition software. Similar sounding words can inadvertently be transcribed and may not be corrected upon review.

## 2021-07-26 ENCOUNTER — Telehealth: Payer: Self-pay | Admitting: Internal Medicine

## 2021-07-26 NOTE — Telephone Encounter (Signed)
Scheduled appt per 10/3 los - unable to reach patient. Called and left message with appt date and time and call back number

## 2021-08-03 NOTE — Progress Notes (Signed)
Anesthesia Chart Review   Case: [id number] Date/Time: 08/11/21 0845   Procedures:      LAPAROSCOPIC EXPLORATION     EXCISIONAL BIOPSY OF LEFT RETROPERITONEAL MASS, POSSIBLE INTRAOPERATIVE LAPAROSCOPIC ULTRASOUND   Anesthesia type: General   Pre-op diagnosis: LEFT RETROPERITONEAL MASS. POSSIBLE LUNG METASTASIS   Location: [facility] ROOM 04 / [facility]   Surgeons: Michael Boston, MD       DISCUSSION:75 y.o. former smoker with h/o HTN, hypothyroidism, left lung adenocarcinoma, left retroperitoneal mass scheduled for above procedure 08/11/2021 with Dr. Michael Boston.   Last seen by oncology 07/25/2021.   Anticipate pt can proceed with planned procedure barring acute status change.   VS: BP (!) 142/81   Pulse 95   Temp 36.6 C (Oral)   Resp 12   Ht 5' (1.524 m)   Wt 72.7 kg   SpO2 100%   BMI 31.29 kg/m   PROVIDERS: [name], MD is PCP    LABS: Labs reviewed: Acceptable for surgery. (all labs ordered are listed, but only abnormal results are displayed)  Labs Reviewed  HEMOGLOBIN A1C - Abnormal; Notable for the following components:      Result Value   Hgb A1c MFr Bld 5.7 (*)    All other components within normal limits  COMPREHENSIVE METABOLIC PANEL - Abnormal; Notable for the following components:   Creatinine, Ser 1.22 (*)    GFR, Estimated 47 (*)    All other components within normal limits  CBC  GLUCOSE, CAPILLARY     IMAGES: CT Angio Chest 07/06/2021 IMPRESSION: 1. Negative for pulmonary embolism or other acute finding. 2. No adverse finding since a July 2022 PET-CT.  EKG: 07/11/2021 Rate 93 bpm  NSR  CV:  Past Medical History:  Diagnosis Date   Adenocarcinoma of left lung, stage 4 ([facility]) 04/05/2015   Biopsy confirmed CT SCAN: There are innumerable bilateral pulmonary nodules. These range in size from about 5 mm to 2 cm. They demonstrate irregular indistinct borders, the larger ones demonstrating spiculation. PET SCAN: Diffuse pulmonary metastatic disease with  a 4 cm dominant left lower low lung lesion. No enlarged or hypermetabolic mediastinal or hilar adenopathy.   Allergy    Anxiety    Cancer ([facility])    Change of skin related to chemotherapy 09/25/2017   Drug-induced skin rash 01/17/2016   History of radiation therapy 12/18/2019   SBRT left lung  12/11/2019-12/18/2019   Dr Gery Pray   History of radiation therapy 06/21/2021   left lung 06/14/2021-06/21/2021 Dr Gery Pray   Hyperlipidemia    Hypertension    Hypothyroidism    Insomnia    Prediabetes    Thyroid disease     Past Surgical History:  Procedure Laterality Date   ABDOMINAL HYSTERECTOMY  10/23/1993   w BSO   CYST REMOVAL HAND Right    TONSILLECTOMY AND ADENOIDECTOMY      MEDICATIONS:  afatinib dimaleate (GILOTRIF) 30 MG tablet   ALPRAZolam (XANAX) 1 MG tablet   aspirin 81 MG tablet   ezetimibe (ZETIA) 10 MG tablet   hydrochlorothiazide (HYDRODIURIL) 25 MG tablet   ibuprofen (ADVIL,MOTRIN) 200 MG tablet   levothyroxine (SYNTHROID) 50 MCG tablet   loratadine-pseudoephedrine (CLARITIN-D 12-HOUR) 5-120 MG tablet   melatonin 5 MG TABS   montelukast (SINGULAIR) 10 MG tablet   No current facility-administered medications for this encounter.     Konrad Felix Ward, PA-C [facility] [phone]

## 2021-08-11 ENCOUNTER — Encounter (HOSPITAL_COMMUNITY): Payer: Self-pay | Admitting: Surgery

## 2021-08-11 ENCOUNTER — Encounter (HOSPITAL_COMMUNITY)
Admission: RE | Disposition: A | Discharge status: Home / Self Care | Payer: Self-pay | Source: Home / Self Care | Attending: Surgery

## 2021-08-11 ENCOUNTER — Observation Stay (HOSPITAL_COMMUNITY)
Admission: RE | Admit: 2021-08-11 | Discharge: 2021-08-12 | Disposition: A | Discharge status: Home / Self Care | Payer: Medicare Other | Attending: Surgery | Admitting: Surgery

## 2021-08-11 ENCOUNTER — Ambulatory Visit (HOSPITAL_COMMUNITY): Payer: Medicare Other | Admitting: Physician Assistant

## 2021-08-11 ENCOUNTER — Ambulatory Visit (HOSPITAL_COMMUNITY): Payer: Medicare Other | Admitting: Certified Registered Nurse Anesthetist

## 2021-08-11 DIAGNOSIS — C8513 Unspecified B-cell lymphoma, intra-abdominal lymph nodes: Principal | ICD-10-CM | POA: Insufficient documentation

## 2021-08-11 DIAGNOSIS — Z7982 Long term (current) use of aspirin: Secondary | ICD-10-CM | POA: Diagnosis not present

## 2021-08-11 DIAGNOSIS — Z79899 Other long term (current) drug therapy: Secondary | ICD-10-CM | POA: Insufficient documentation

## 2021-08-11 DIAGNOSIS — Z87891 Personal history of nicotine dependence: Secondary | ICD-10-CM | POA: Diagnosis not present

## 2021-08-11 DIAGNOSIS — I10 Essential (primary) hypertension: Secondary | ICD-10-CM | POA: Diagnosis not present

## 2021-08-11 DIAGNOSIS — C7800 Secondary malignant neoplasm of unspecified lung: Secondary | ICD-10-CM | POA: Diagnosis not present

## 2021-08-11 DIAGNOSIS — K689 Other disorders of retroperitoneum: Secondary | ICD-10-CM | POA: Diagnosis present

## 2021-08-11 DIAGNOSIS — C3490 Malignant neoplasm of unspecified part of unspecified bronchus or lung: Secondary | ICD-10-CM | POA: Diagnosis present

## 2021-08-11 HISTORY — PX: EXCISION MASS ABDOMINAL: SHX6701

## 2021-08-11 HISTORY — PX: LAPAROSCOPY: SHX197

## 2021-08-11 LAB — GLUCOSE, CAPILLARY: Glucose-Capillary: 154 mg/dL — ABNORMAL HIGH (ref 70–99)

## 2021-08-11 SURGERY — LAPAROSCOPY, DIAGNOSTIC
Anesthesia: General | Site: Abdomen

## 2021-08-11 MED ORDER — DIPHENHYDRAMINE HCL 50 MG/ML IJ SOLN: 12.5000 mg | Freq: Four times a day (QID) | INTRAMUSCULAR | Status: DC | PRN

## 2021-08-11 MED ORDER — BUPIVACAINE LIPOSOME 1.3 % IJ SUSP
INTRAMUSCULAR | Status: DC | PRN
  Administered 2021-08-11: 20 mL

## 2021-08-11 MED ORDER — 0.9 % SODIUM CHLORIDE (POUR BTL) OPTIME
TOPICAL | Status: DC | PRN
  Administered 2021-08-11: 1000 mL

## 2021-08-11 MED ORDER — BUPIVACAINE LIPOSOME 1.3 % IJ SUSP
20.0000 mL | Freq: Once | INTRAMUSCULAR | Status: DC
Start: 2021-08-11 — End: 2021-08-11

## 2021-08-11 MED ORDER — KETOROLAC TROMETHAMINE 30 MG/ML IJ SOLN
INTRAMUSCULAR | Status: DC | PRN
  Administered 2021-08-11: 30 mg via INTRAVENOUS

## 2021-08-11 MED ORDER — ORAL CARE MOUTH RINSE: 15.0000 mL | Freq: Once | OROMUCOSAL | Status: AC

## 2021-08-11 MED ORDER — FENTANYL CITRATE (PF) 100 MCG/2ML IJ SOLN
INTRAMUSCULAR | Status: AC
  Filled 2021-08-11: qty 2

## 2021-08-11 MED ORDER — KETOROLAC TROMETHAMINE 30 MG/ML IJ SOLN
INTRAMUSCULAR | Status: AC
  Filled 2021-08-11: qty 1

## 2021-08-11 MED ORDER — HYDROCHLOROTHIAZIDE 25 MG PO TABS
25.0000 mg | ORAL_TABLET | Freq: Every day | ORAL | Status: DC
  Administered 2021-08-11: 25 mg via ORAL
  Filled 2021-08-11: qty 1

## 2021-08-11 MED ORDER — DIPHENHYDRAMINE HCL 12.5 MG/5ML PO ELIX: 12.5000 mg | ORAL_SOLUTION | Freq: Four times a day (QID) | ORAL | Status: DC | PRN

## 2021-08-11 MED ORDER — LACTATED RINGERS IV SOLN: INTRAVENOUS | Status: DC

## 2021-08-11 MED ORDER — SIMETHICONE 80 MG PO CHEW: 40.0000 mg | CHEWABLE_TABLET | Freq: Four times a day (QID) | ORAL | Status: DC | PRN

## 2021-08-11 MED ORDER — DEXAMETHASONE SODIUM PHOSPHATE 4 MG/ML IJ SOLN
INTRAMUSCULAR | Status: DC | PRN
  Administered 2021-08-11: 5 mg via INTRAVENOUS

## 2021-08-11 MED ORDER — CHLORHEXIDINE GLUCONATE 0.12 % MT SOLN
15.0000 mL | Freq: Once | OROMUCOSAL | Status: AC
  Administered 2021-08-11: 15 mL via OROMUCOSAL

## 2021-08-11 MED ORDER — METOPROLOL TARTRATE 5 MG/5ML IV SOLN: 5.0000 mg | Freq: Four times a day (QID) | INTRAVENOUS | Status: DC | PRN

## 2021-08-11 MED ORDER — LACTATED RINGERS IV BOLUS: 1000.0000 mL | Freq: Three times a day (TID) | INTRAVENOUS | Status: DC | PRN

## 2021-08-11 MED ORDER — METHOCARBAMOL 1000 MG/10ML IJ SOLN
1000.0000 mg | Freq: Four times a day (QID) | INTRAVENOUS | Status: DC | PRN
  Administered 2021-08-11: 1000 mg via INTRAVENOUS
  Filled 2021-08-11: qty 10

## 2021-08-11 MED ORDER — ROCURONIUM BROMIDE 10 MG/ML (PF) SYRINGE
PREFILLED_SYRINGE | INTRAVENOUS | Status: AC
  Filled 2021-08-11: qty 10

## 2021-08-11 MED ORDER — LACTATED RINGERS IV SOLN: INTRAVENOUS | Status: AC

## 2021-08-11 MED ORDER — PHENYLEPHRINE 40 MCG/ML (10ML) SYRINGE FOR IV PUSH (FOR BLOOD PRESSURE SUPPORT)
PREFILLED_SYRINGE | INTRAVENOUS | Status: AC
  Filled 2021-08-11: qty 10

## 2021-08-11 MED ORDER — LIDOCAINE HCL 2 % IJ SOLN
INTRAMUSCULAR | Status: AC
  Filled 2021-08-11: qty 20

## 2021-08-11 MED ORDER — CHLORHEXIDINE GLUCONATE CLOTH 2 % EX PADS: 6.0000 | MEDICATED_PAD | Freq: Once | CUTANEOUS | Status: DC

## 2021-08-11 MED ORDER — LIP MEDEX EX OINT
1.0000 "application " | TOPICAL_OINTMENT | Freq: Two times a day (BID) | CUTANEOUS | Status: DC
  Administered 2021-08-11: 1 via TOPICAL
  Filled 2021-08-11: qty 7

## 2021-08-11 MED ORDER — METHOCARBAMOL 500 MG IVPB - SIMPLE MED
INTRAVENOUS | Status: AC
  Filled 2021-08-11: qty 50

## 2021-08-11 MED ORDER — ROCURONIUM BROMIDE 10 MG/ML (PF) SYRINGE
PREFILLED_SYRINGE | INTRAVENOUS | Status: DC | PRN
  Administered 2021-08-11: 60 mg via INTRAVENOUS

## 2021-08-11 MED ORDER — PROPOFOL 1000 MG/100ML IV EMUL
INTRAVENOUS | Status: AC
  Filled 2021-08-11: qty 100

## 2021-08-11 MED ORDER — EPHEDRINE SULFATE-NACL 50-0.9 MG/10ML-% IV SOSY
PREFILLED_SYRINGE | INTRAVENOUS | Status: DC | PRN
  Administered 2021-08-11: 7.5 mg via INTRAVENOUS

## 2021-08-11 MED ORDER — GABAPENTIN 300 MG PO CAPS
300.0000 mg | ORAL_CAPSULE | ORAL | Status: AC
  Administered 2021-08-11: 300 mg via ORAL
  Filled 2021-08-11: qty 1

## 2021-08-11 MED ORDER — FENTANYL CITRATE PF 50 MCG/ML IJ SOSY: 25.0000 ug | PREFILLED_SYRINGE | INTRAMUSCULAR | Status: DC | PRN

## 2021-08-11 MED ORDER — BUPIVACAINE-EPINEPHRINE 0.25% -1:200000 IJ SOLN
INTRAMUSCULAR | Status: DC | PRN
  Administered 2021-08-11: 30 mL

## 2021-08-11 MED ORDER — FENTANYL CITRATE (PF) 100 MCG/2ML IJ SOLN
INTRAMUSCULAR | Status: DC | PRN
  Administered 2021-08-11: 50 ug via INTRAVENOUS
  Administered 2021-08-11: 25 ug via INTRAVENOUS
  Administered 2021-08-11 (×3): 50 ug via INTRAVENOUS

## 2021-08-11 MED ORDER — ONDANSETRON HCL 4 MG/2ML IJ SOLN: 4.0000 mg | Freq: Once | INTRAMUSCULAR | Status: DC | PRN

## 2021-08-11 MED ORDER — METHOCARBAMOL 500 MG PO TABS: 500.0000 mg | ORAL_TABLET | Freq: Four times a day (QID) | ORAL | Status: DC | PRN

## 2021-08-11 MED ORDER — CALCIUM POLYCARBOPHIL 625 MG PO TABS
625.0000 mg | ORAL_TABLET | Freq: Two times a day (BID) | ORAL | Status: DC
Start: 2021-08-11 — End: 2021-08-12
  Administered 2021-08-11: 625 mg via ORAL
  Filled 2021-08-11: qty 1

## 2021-08-11 MED ORDER — MELATONIN 5 MG PO TABS
5.0000 mg | ORAL_TABLET | Freq: Every day | ORAL | Status: DC
  Administered 2021-08-11: 5 mg via ORAL
  Filled 2021-08-11: qty 1

## 2021-08-11 MED ORDER — IBUPROFEN 400 MG PO TABS: 400.0000 mg | ORAL_TABLET | Freq: Four times a day (QID) | ORAL | Status: DC | PRN

## 2021-08-11 MED ORDER — STERILE WATER FOR IRRIGATION IR SOLN
Status: DC | PRN
  Administered 2021-08-11: 1000 mL

## 2021-08-11 MED ORDER — BUPIVACAINE LIPOSOME 1.3 % IJ SUSP
INTRAMUSCULAR | Status: AC
  Filled 2021-08-11: qty 20

## 2021-08-11 MED ORDER — DEXAMETHASONE SODIUM PHOSPHATE 10 MG/ML IJ SOLN
INTRAMUSCULAR | Status: AC
  Filled 2021-08-11: qty 1

## 2021-08-11 MED ORDER — ENOXAPARIN SODIUM 40 MG/0.4ML IJ SOSY
40.0000 mg | PREFILLED_SYRINGE | INTRAMUSCULAR | Status: DC
  Filled 2021-08-11: qty 0.4

## 2021-08-11 MED ORDER — ONDANSETRON HCL 4 MG/2ML IJ SOLN
INTRAMUSCULAR | Status: DC | PRN
  Administered 2021-08-11: 4 mg via INTRAVENOUS

## 2021-08-11 MED ORDER — CLINDAMYCIN PHOSPHATE 900 MG/50ML IV SOLN
900.0000 mg | INTRAVENOUS | Status: AC
  Administered 2021-08-11: 900 mg via INTRAVENOUS
  Filled 2021-08-11: qty 50

## 2021-08-11 MED ORDER — SODIUM CHLORIDE 0.9% FLUSH: 3.0000 mL | INTRAVENOUS | Status: DC | PRN

## 2021-08-11 MED ORDER — MONTELUKAST SODIUM 10 MG PO TABS
10.0000 mg | ORAL_TABLET | Freq: Every day | ORAL | Status: DC
  Administered 2021-08-11: 10 mg via ORAL
  Filled 2021-08-11: qty 1

## 2021-08-11 MED ORDER — GABAPENTIN 300 MG PO CAPS
300.0000 mg | ORAL_CAPSULE | Freq: Two times a day (BID) | ORAL | Status: DC
  Administered 2021-08-11: 300 mg via ORAL
  Filled 2021-08-11: qty 1

## 2021-08-11 MED ORDER — DROPERIDOL 2.5 MG/ML IJ SOLN: 0.6250 mg | Freq: Once | INTRAMUSCULAR | Status: DC | PRN

## 2021-08-11 MED ORDER — TRAMADOL HCL 50 MG PO TABS
50.0000 mg | ORAL_TABLET | Freq: Four times a day (QID) | ORAL | Status: DC | PRN
  Administered 2021-08-11: 50 mg via ORAL

## 2021-08-11 MED ORDER — EPHEDRINE 5 MG/ML INJ
INTRAVENOUS | Status: AC
  Filled 2021-08-11: qty 5

## 2021-08-11 MED ORDER — ACETAMINOPHEN 500 MG PO TABS
1000.0000 mg | ORAL_TABLET | ORAL | Status: AC
  Administered 2021-08-11: 1000 mg via ORAL
  Filled 2021-08-11: qty 2

## 2021-08-11 MED ORDER — LIDOCAINE 2% (20 MG/ML) 5 ML SYRINGE
INTRAMUSCULAR | Status: DC | PRN
  Administered 2021-08-11: 100 mg via INTRAVENOUS

## 2021-08-11 MED ORDER — PROPOFOL 10 MG/ML IV BOLUS
INTRAVENOUS | Status: DC | PRN
Start: 2021-08-11 — End: 2021-08-11
  Administered 2021-08-11: 120 mg via INTRAVENOUS

## 2021-08-11 MED ORDER — NEOSTIGMINE METHYLSULFATE 3 MG/3ML IV SOSY
PREFILLED_SYRINGE | INTRAVENOUS | Status: AC
  Filled 2021-08-11: qty 3

## 2021-08-11 MED ORDER — ENSURE PRE-SURGERY PO LIQD
296.0000 mL | Freq: Once | ORAL | Status: DC
  Filled 2021-08-11: qty 296

## 2021-08-11 MED ORDER — MAGNESIUM HYDROXIDE 400 MG/5ML PO SUSP: 30.0000 mL | Freq: Every day | ORAL | Status: DC | PRN

## 2021-08-11 MED ORDER — LEVOTHYROXINE SODIUM 50 MCG PO TABS
50.0000 ug | ORAL_TABLET | Freq: Every day | ORAL | Status: DC
  Administered 2021-08-12: 50 ug via ORAL
  Filled 2021-08-11: qty 1

## 2021-08-11 MED ORDER — GENTAMICIN SULFATE 40 MG/ML IJ SOLN
5.0000 mg/kg | INTRAVENOUS | Status: AC
  Administered 2021-08-11: 280 mg via INTRAVENOUS
  Filled 2021-08-11: qty 7

## 2021-08-11 MED ORDER — ASPIRIN EC 81 MG PO TBEC
81.0000 mg | DELAYED_RELEASE_TABLET | Freq: Every day | ORAL | Status: DC
  Administered 2021-08-11: 81 mg via ORAL
  Filled 2021-08-11: qty 1

## 2021-08-11 MED ORDER — LACTATED RINGERS IR SOLN
Status: DC | PRN
  Administered 2021-08-11: 2000 mL

## 2021-08-11 MED ORDER — MAGIC MOUTHWASH
15.0000 mL | Freq: Four times a day (QID) | ORAL | Status: DC | PRN
  Filled 2021-08-11: qty 15

## 2021-08-11 MED ORDER — SODIUM CHLORIDE 0.9 % IV SOLN: Freq: Three times a day (TID) | INTRAVENOUS | Status: DC | PRN

## 2021-08-11 MED ORDER — BUPIVACAINE-EPINEPHRINE (PF) 0.25% -1:200000 IJ SOLN
INTRAMUSCULAR | Status: AC
  Filled 2021-08-11: qty 30

## 2021-08-11 MED ORDER — TRAMADOL HCL 50 MG PO TABS
ORAL_TABLET | ORAL | Status: AC
  Filled 2021-08-11: qty 1

## 2021-08-11 MED ORDER — SODIUM CHLORIDE 0.9 % IV SOLN: 250.0000 mL | INTRAVENOUS | Status: DC | PRN

## 2021-08-11 MED ORDER — ACETAMINOPHEN 500 MG PO TABS
1000.0000 mg | ORAL_TABLET | Freq: Four times a day (QID) | ORAL | Status: DC
  Administered 2021-08-11 – 2021-08-12 (×2): 1000 mg via ORAL
  Filled 2021-08-11 (×2): qty 2

## 2021-08-11 MED ORDER — SUGAMMADEX SODIUM 200 MG/2ML IV SOLN
INTRAVENOUS | Status: DC | PRN
  Administered 2021-08-11: 200 mg via INTRAVENOUS

## 2021-08-11 MED ORDER — ALPRAZOLAM 1 MG PO TABS
1.0000 mg | ORAL_TABLET | Freq: Every day | ORAL | Status: DC
  Administered 2021-08-11: 1 mg via ORAL
  Filled 2021-08-11: qty 1

## 2021-08-11 MED ORDER — EZETIMIBE 10 MG PO TABS
10.0000 mg | ORAL_TABLET | Freq: Every day | ORAL | Status: DC
  Administered 2021-08-11: 10 mg via ORAL
  Filled 2021-08-11: qty 1

## 2021-08-11 MED ORDER — PROPOFOL 500 MG/50ML IV EMUL
INTRAVENOUS | Status: DC | PRN
  Administered 2021-08-11: 25 ug/kg/min via INTRAVENOUS

## 2021-08-11 MED ORDER — BISACODYL 10 MG RE SUPP: 10.0000 mg | Freq: Every day | RECTAL | Status: DC | PRN

## 2021-08-11 MED ORDER — SODIUM CHLORIDE 0.9% FLUSH
3.0000 mL | Freq: Two times a day (BID) | INTRAVENOUS | Status: DC
  Administered 2021-08-11: 3 mL via INTRAVENOUS

## 2021-08-11 MED ORDER — PHENYLEPHRINE 40 MCG/ML (10ML) SYRINGE FOR IV PUSH (FOR BLOOD PRESSURE SUPPORT)
PREFILLED_SYRINGE | INTRAVENOUS | Status: DC | PRN
  Administered 2021-08-11 (×3): 80 ug via INTRAVENOUS
  Administered 2021-08-11: 120 ug via INTRAVENOUS
  Administered 2021-08-11 (×2): 80 ug via INTRAVENOUS

## 2021-08-11 MED ORDER — LIDOCAINE HCL (PF) 2 % IJ SOLN
INTRAMUSCULAR | Status: DC | PRN
Start: 2021-08-11 — End: 2021-08-11
  Administered 2021-08-11: 1.5 mg/kg/h via INTRADERMAL

## 2021-08-11 SURGICAL SUPPLY — 41 items
BAG COUNTER SPONGE SURGICOUNT (BAG) IMPLANT
CABLE HIGH FREQUENCY MONO STRZ (ELECTRODE) ×2 IMPLANT
COVER SURGICAL LIGHT HANDLE (MISCELLANEOUS) ×2 IMPLANT
DECANTER SPIKE VIAL GLASS SM (MISCELLANEOUS) ×2 IMPLANT
DRAIN CHANNEL 19F RND (DRAIN) ×2 IMPLANT
DRAPE UTILITY XL STRL (DRAPES) ×2 IMPLANT
DRAPE WARM FLUID 44X44 (DRAPES) ×2 IMPLANT
DRSG TEGADERM 2-3/8X2-3/4 SM (GAUZE/BANDAGES/DRESSINGS) ×2 IMPLANT
DRSG TEGADERM 4X4.75 (GAUZE/BANDAGES/DRESSINGS) ×2 IMPLANT
ELECT REM PT RETURN 15FT ADLT (MISCELLANEOUS) ×2 IMPLANT
EVACUATOR SILICONE 100CC (DRAIN) ×2 IMPLANT
GAUZE SPONGE 2X2 8PLY STRL LF (GAUZE/BANDAGES/DRESSINGS) ×1 IMPLANT
GLOVE SURG NEOPR MICRO LF SZ8 (GLOVE) ×2 IMPLANT
GLOVE SURG UNDER LTX SZ8 (GLOVE) ×2 IMPLANT
GOWN STRL REUS W/TWL XL LVL3 (GOWN DISPOSABLE) ×6 IMPLANT
IRRIG SUCT STRYKERFLOW 2 WTIP (MISCELLANEOUS) ×2
IRRIGATION SUCT STRKRFLW 2 WTP (MISCELLANEOUS) ×1 IMPLANT
KIT BASIN OR (CUSTOM PROCEDURE TRAY) ×2 IMPLANT
PAD POSITIONING PINK XL (MISCELLANEOUS) ×2 IMPLANT
PROTECTOR NERVE ULNAR (MISCELLANEOUS) IMPLANT
SCISSORS LAP 5X35 DISP (ENDOMECHANICALS) ×2 IMPLANT
SEALER TISSUE G2 STRG ARTC 35C (ENDOMECHANICALS) IMPLANT
SET TUBE SMOKE EVAC HIGH FLOW (TUBING) ×2 IMPLANT
SHEARS HARMONIC ACE PLUS 36CM (ENDOMECHANICALS) ×2 IMPLANT
SLEEVE XCEL OPT CAN 5 100 (ENDOMECHANICALS) ×4 IMPLANT
SPONGE GAUZE 2X2 STER 10/PKG (GAUZE/BANDAGES/DRESSINGS) ×1
SUT MNCRL AB 4-0 PS2 18 (SUTURE) ×2 IMPLANT
SUT PROLENE 2 0 SH DA (SUTURE) ×2 IMPLANT
SUT SILK 2 0 (SUTURE) ×2
SUT SILK 2 0 SH CR/8 (SUTURE) ×2 IMPLANT
SUT SILK 2-0 18XBRD TIE 12 (SUTURE) ×1 IMPLANT
SUT SILK 3 0 (SUTURE) ×1
SUT SILK 3 0 SH CR/8 (SUTURE) ×2 IMPLANT
SUT SILK 3-0 18XBRD TIE 12 (SUTURE) ×1 IMPLANT
SUT VICRYL 0 UR6 27IN ABS (SUTURE) ×2 IMPLANT
TOWEL OR 17X26 10 PK STRL BLUE (TOWEL DISPOSABLE) ×2 IMPLANT
TOWEL OR NON WOVEN STRL DISP B (DISPOSABLE) ×2 IMPLANT
TRAY FOLEY MTR SLVR 16FR STAT (SET/KITS/TRAYS/PACK) IMPLANT
TRAY LAPAROSCOPIC (CUSTOM PROCEDURE TRAY) ×2 IMPLANT
TROCAR BLADELESS OPT 5 100 (ENDOMECHANICALS) ×2 IMPLANT
TROCAR XCEL NON-BLD 11X100MML (ENDOMECHANICALS) IMPLANT

## 2021-08-11 NOTE — Anesthesia Preprocedure Evaluation (Signed)
Anesthesia Evaluation  Patient identified by MRN, date of birth, ID band Patient awake    Reviewed: Allergy & Precautions, NPO status , Patient's Chart, lab work & pertinent test results, reviewed documented beta blocker date and time   Airway Mallampati: II  TM Distance: >3 FB Neck ROM: Full    Dental  (+) Teeth Intact, Dental Advisory Given, Caps   Pulmonary former smoker,  Adenocarcinoma of left lung Stage 4, undergoing chemoRx and RT   Pulmonary exam normal breath sounds clear to auscultation       Cardiovascular hypertension, Pt. on medications Normal cardiovascular exam Rhythm:Regular Rate:Normal     Neuro/Psych Anxiety negative neurological ROS     GI/Hepatic Neg liver ROS, Left retroperitoneal mass   Endo/Other  Hypothyroidism Obesity Hyperlipidemia  Renal/GU negative Renal ROS  negative genitourinary   Musculoskeletal negative musculoskeletal ROS (+)   Abdominal (+) + obese,   Peds  Hematology negative hematology ROS (+)   Anesthesia Other Findings   Reproductive/Obstetrics                             Anesthesia Physical Anesthesia Plan  ASA: 3  Anesthesia Plan: General   Post-op Pain Management:    Induction: Intravenous  PONV Risk Score and Plan: 4 or greater and Treatment may vary due to age or medical condition, Ondansetron and Dexamethasone  Airway Management Planned: Oral ETT  Additional Equipment:   Intra-op Plan:   Post-operative Plan: Extubation in OR  Informed Consent: I have reviewed the patients History and Physical, chart, labs and discussed the procedure including the risks, benefits and alternatives for the proposed anesthesia with the patient or authorized representative who has indicated his/her understanding and acceptance.     Dental advisory given  Plan Discussed with: CRNA and Anesthesiologist  Anesthesia Plan Comments:          Anesthesia Quick Evaluation

## 2021-08-11 NOTE — Transfer of Care (Signed)
Immediate Anesthesia Transfer of Care Note  Patient: Elizabeth [name]  Procedure(s) Performed: LAPAROSCOPIC EXPLORATION (Abdomen) EXCISIONAL BIOPSY OF LEFT RETROPERITONEAL MASS, POSSIBLE INTRAOPERATIVE LAPAROSCOPIC ULTRASOUND (Abdomen)  Patient Location: PACU  Anesthesia Type:General  Level of Consciousness: drowsy  Airway & Oxygen Therapy: Patient Spontanous Breathing and Patient connected to face mask  Post-op Assessment: Report given to RN and Post -op Vital signs reviewed and stable  Post vital signs: Reviewed and stable  Last Vitals:  Vitals Value Taken Time  BP 114/68 08/11/21 1325  Temp    Pulse 89 08/11/21 1328  Resp 14 08/11/21 1328  SpO2 100 % 08/11/21 1328  Vitals shown include unvalidated device data.  Last Pain:  Vitals:   08/11/21 1325  TempSrc:   PainSc: Asleep         Complications: No notable events documented.

## 2021-08-11 NOTE — H&P (Signed)
08/11/2021      REFERRING PHYSICIAN: Heilingoetter, Architectural technologist*  Patient Care Team: Alesia Richards, MD as PCP - General (Internal Medicine) Johney Maine, Adrian Saran, MD as Consulting Provider (General Surgery) Eilleen Kempf, MD (Hematology and Oncology)  PROVIDER: Hollace Kinnier, MD  [facility] MRN: [id number] DOB: 08-08-1946   Subjective   Chief Complaint: retroperitoneal soft tissue lesion   History of Present Illness: Elizabeth [name] is a 76 y.o. female who is seen today as an office consultation at the request of Dr. Julien Nordmann for evaluation of retroperitoneal soft tissue lesion .   Pleasant 75 year old female with stage IV non-small cell lung cancer. Manage with [facility] treat if oral chemotherapy then has not under control. Recently had left chest wall mass. Biopsy suspicious for low-grade lymphoma. I also had a mass in the left retroperitoneum inside the iliac crest that is PET positive of uncertain etiology. Request made for excisional biopsy of this mass. I have operated on her husband. She takes aspirin but is on no blood thinners. She had a laparoscopic hysterectomy but no other abdominal surgery. She does struggle with some shortness of breath and low back pain so cannot walk more than 15 minutes at a time but does not have any exertional chest pain. She does not smoke. No cardiac or pulmonary issues. Not on oxygen. Moves her bowels every day. No sleep apnea. On Singulair. Not needing inhalers very regularly. Takes Synthroid for hypothyroidism.  Ready for surgery no new events  Medical History: Past Medical History:  Diagnosis Date   Anxiety   History of cancer   Hyperlipidemia   Hypertension   Thyroid disease   There is no problem list on file for this patient.  History reviewed. No pertinent surgical history.   Allergies  Allergen Reactions   Penicillins Rash and Swelling   Current Outpatient Medications on File Prior to Visit  Medication Sig Dispense  Refill   afatinib (GILOTRIF) 30 mg tablet TAKE 1 TABLET (30MG ) BY MOUTH ONCE DAILY. TAKE ON AN EMPTY STOMACH 1 HOUR BEFORE OR 2 HOURS AFTER A MEAL   levothyroxine (SYNTHROID) 50 MCG tablet TAKE 1 TABLET BY MOUTH DAILY ON AN EMPTY STOMACH WITH ONLY WATER FOR 30 MINUTES. NO ANTACID, CALCIUM, OR MAGNESIUM FOR 4 HOURS. AVOID BIOTIN   montelukast (SINGULAIR) 10 mg tablet TAKE 1 TABLET BY MOUTH DAILY FOR ALLERGIES   ALPRAZolam (XANAX) 1 MG tablet   aspirin 81 MG EC tablet Take by mouth   cholecalciferol, vitamin D3, 250 mcg (10,000 unit) Tab Take by mouth   cyclobenzaprine (FLEXERIL) 5 MG tablet Take 5-10 mg by mouth at bedtime as needed   ezetimibe (ZETIA) 10 mg tablet   hydroCHLOROthiazide (HYDRODIURIL) 25 MG tablet   No current facility-administered medications on file prior to visit.   History reviewed. No pertinent family history.   Social History   Tobacco Use  Smoking Status Former Smoker   Quit date: 1976   Years since quitting: 46.6  Smokeless Tobacco Never Used    Social History   Socioeconomic History   Marital status: Married  Tobacco Use   Smoking status: Former Smoker  Quit date: 1976  Years since quitting: 46.6   Smokeless tobacco: Never Used  Substance and Sexual Activity   Alcohol use: Never   Drug use: Never   ############################################################  Fraility Risk:  Preoperative Risks/Screening 1. Frailty Review:  Lives independently? yes Uses a mobility assist device (cane/walker/wheelchair)? no History of falls within 3 months? no Cognitive impairment/dementia? no  Age > 37? yes  2. Nutrition Screening: Cancer/IBD? yes Age >65? yes Weight loss >10% in past 6 months? no If yes to any of the 3 above, consider Impact AR supplemental shake  3. PONV Screening: History of PONV? no Female under age of 61? no History of motion sickness? no  4. Chronic pain issues? yes  5. Diabetes? no Last HgbA1c: No results found for:  HGBA1C  Review of Systems: A complete review of systems (ROS) was obtained from the patient. I have reviewed this information and discussed as appropriate with the patient. See HPI as well for other pertinent ROS.  Constitutional: No fevers, chills, sweats. Weight stable Eyes: No vision changes, No discharge HENT: No sore throats, nasal drainage Lymph: No neck swelling, No bruising easily Pulmonary: No cough, productive sputum CV: No orthopnea, PND Patient walks 15 minutes for about 1/2 miles without difficulty. No exertional chest/neck/shoulder/arm pain.  GI: No personal nor family history of GI/colon cancer, inflammatory bowel disease, irritable bowel syndrome, allergy such as Celiac Sprue, dietary/dairy problems, colitis, ulcers nor gastritis. No recent sick contacts/gastroenteritis. No travel outside the country. No changes in diet.  Renal: No UTIs, No hematuria Genital: No drainage, bleeding, masses Musculoskeletal: No severe joint pain. Good ROM major joints Skin: No sores or lesions Heme/Lymph: No easy bleeding. No swollen lymph nodes  Objective:   Vitals:  06/20/21 0940  BP: 118/76  Pulse: 107  Temp: 36.4 C (97.5 F)  SpO2: 99%  Weight: 74.2 kg (163 lb 9.6 oz)  Height: 152.4 cm (5')    Body mass index is 31.95 kg/m.  BP (!) 148/84   Pulse 83   Temp 97.6 F (36.4 C) (Oral)   Resp 15   Ht 5' (1.524 m)   Wt 73 kg   SpO2 97%   BMI 31.43 kg/m  08/11/2021   PHYSICAL EXAM:  Constitutional: Not cachectic. Hygeine adequate. Vitals signs as above.  Eyes: Pupils reactive, normal extraocular movements. Sclera nonicteric Neuro: CN II-XII intact. No major focal sensory defects. No major motor deficits. Lymph: No head/neck/groin lymphadenopathy Psych: No severe agitation. No severe anxiety. Judgment & insight Adequate, Oriented x4, HENT: Normocephalic, Mucus membranes moist. No thrush.  Neck: Supple, No tracheal deviation. No obvious thyromegaly Chest: No pain to  chest wall compression. Good respiratory excursion. No audible wheezing CV: Pulses intact. Regular rhythm. No major extremity edema  Abdomen: Obese Hernia: Not present. Diastasis recti: Not present. Soft. Nondistended. Nontender. No hepatomegaly. No splenomegaly  Gen: Inguinal hernia: Not present. Inguinal lymph nodes: without lymphadenopathy.   Rectal: (Deferred)  Ext: No obvious deformity or contracture. Edema: Not present. No cyanosis Skin: No major subcutaneous nodules. Warm and dry Musculoskeletal: Severe joint rigidity not present. No obvious clubbing. No digital petechiae.   Labs, Imaging and Diagnostic Testing:  Located in [place]' section of Epic EMR chart  PRIOR NOTES   Not applicable  SURGERY NOTES:  Located in [place]' section of Epic EMR chart  PATHOLOGY:  Located in [place]' section of Epic EMR chart  Assessment and Plan:  DIAGNOSES:  Diagnoses and all orders for this visit:  Retroperitoneal mass - [facility] Case Posting Request; Future  History of lung cancer  Follicular low grade B-cell lymphoma (CMS-HCC)    ASSESSMENT/PLAN  Pleasant woman. Lung cancer survivor stable on oral chemotherapy for several years with recent possible low-grade B lymphoma on chest wall.   Also left retroperitoneal mass PET avid concerning for metastasis.  There is no good location for  needle core biopsy as there was for the chest wall mass. Dr. Julien Nordmann hoping I can do excisional biopsy of the mass for diagnosis and therapeutic purposes. Reviewed the films with my laparoscopic partner, Dr. Rosendo Gros. Looks like this is sitting in the left anterolateral retroperitoneum. It is not near the iliac vessels so does not seem classic for a lymph node.  I think it is reasonable to do a TAP preperitoneal laparoscopic dissection. Possible intraoperative ultrasound to help locate an excisional biopsy. May have to rotate in the flank. It seems to be sitting in  retroperitoneal fat superficial to the upper end of the iliac crest. Quite lateral. I did caution that given the fact that is relatively small, it may be hard to find. However the solid mass is sitting in fat isolated should be able to locate. We will see. Hopefully just an outpatient surgery. She has decent performance status and this is not a superhigh risk surgery since the mass is rather small, she agrees with proceeding.  FOLLOWUP: Return for Plan to schedule surgery. See instructions.  ########################################################  Adin Hector, MD, FACS, MASCRS Esophageal, Gastrointestinal & Colorectal Surgery Robotic and Minimally Invasive Surgery  [facility], [place]  [place]. [address] [place], [place] [address] [phone] Fax [phone] Main       Note: Portions of this report may have been transcribed using voice recognition software. Every effort was made to ensure accuracy; however, inadvertent computerized transcription errors may be present. Any transcriptional errors that result from this process are unintentional. Electronically signed by Johney Maine, Adrian Saran, MD at 06/20/2021 12:13 PM EDT

## 2021-08-11 NOTE — Op Note (Signed)
08/11/2021  1:16 PM  PATIENT:  Elizabeth [name]  75 y.o. female  Patient Care Team: [name], MD as PCP - General (Internal Medicine) Sharol Roussel, [place] as Physician Assistant (Optometry) Jettie Booze, MD as Consulting Physician (Interventional Cardiology) Teena Irani, MD (Inactive) as Consulting Physician (Gastroenterology) Druscilla Brownie, MD as Consulting Physician (Dermatology) Clent Jacks, MD as Consulting Physician (Ophthalmology) Erroll Luna, MD as Consulting Physician (General Surgery) Melrose Nakayama, MD as Consulting Physician (Orthopedic Surgery) Curt Bears, MD as Consulting Physician (Oncology)  PRE-OPERATIVE DIAGNOSIS:  LEFT RETROPERITONEAL MASS. POSSIBLE LUNG METASTASIS  POST-OPERATIVE DIAGNOSIS:  LEFT RETROPERITONEAL MASS. POSSIBLE LUNG METASTASIS  PROCEDURE:   LAPAROSCOPIC PREPERITONEAL/RETROPERITONEAL EXPLORATION EXCISIONAL BIOPSY OF LEFT RETROPERITONEAL SOFT TISSUE CONTENTS INTRAOPERATIVE LAPAROSCOPIC ULTRASOUND  SURGEON:  Adin Hector, MD  ASSISTANT: Genella Mech, PGY-3 [employer]  ANESTHESIA:     Regional ilioinguinal and genitofemoral and spermatic cord nerve blocks  General  Regional TRANSVERSUS ABDOMINIS PLANE (TAP) nerve block for perioperative & postoperative pain control provided with liposomal bupivacaine (Experel) mixed with 0.25% bupivacaine as a Bilateral TAP block x 70mL each side at the level of the transverse abdominis & preperitoneal spaces along the flank at the anterior axillary line, from subcostal ridge to iliac crest under laparoscopic guidance    EBL:  Total I/O In: 1857 [I.V.:1700; IV Piggyback:157] Out: 100 [Blood:100].  See anesthesia record  Delay start of Pharmacological VTE agent (>24hrs) due to surgical blood loss or risk of bleeding:  no  DRAINS: 66 [place] Blake drain goes from left mid abdomen along paracolic gutter and retroperitoneum with tip in lateral pelvis  SPECIMEN: Left lower  quadrant retroperitoneal contents.  Primarily adipose tissue.  DISPOSITION OF SPECIMEN:  N/A  COUNTS:  YES  PLAN OF CARE: Discharge to home after PACU  PATIENT DISPOSITION:  PACU - hemodynamically stable.  INDICATION:   The anatomy & physiology of the abdominal wall and pelvic floor was discussed.  The pathophysiology of hernias in the inguinal and pelvic region was discussed.  Natural history risks such as progressive enlargement, pain, incarceration & strangulation was discussed.   Contributors to complications such as smoking, obesity, diabetes, prior surgery, etc were discussed.    I feel the risks of no intervention will lead to serious problems that outweigh the operative risks; therefore, I recommended surgery to reduce and repair the hernia.  I explained laparoscopic techniques with possible need for an open approach.  I noted usual use of mesh to patch and/or buttress hernia repair  Risks such as bleeding, infection, abscess, need for further treatment, heart attack, death, and other risks were discussed.  I noted a good likelihood this will help address the problem.   Goals of post-operative recovery were discussed as well.  Possibility that this will not correct all symptoms was explained.  I stressed the importance of low-impact activity, aggressive pain control, avoiding constipation, & not pushing through pain to minimize risk of post-operative chronic pain or injury. Possibility of reherniation was discussed.  We will work to minimize complications.     An educational handout further explaining the pathology & treatment options was given as well.  Questions were answered.  The patient expresses understanding & wishes to proceed with surgery.  OR FINDINGS: No discrete retroperitoneal mass in the region of concern by PET scan and CT scan.  Reviewed interoperatively with Dr. Nadeen Landau and Rolm Bookbinder with my group as well.  Adipose fibrotic tissue freed off and removed  from inferior left kidney Gerota's fascia  superiorly down to the left internal ring.  Medially to posterior midline psoas muscle and laterally to anterior superior iliac spine.  DESCRIPTION:  The patient was identified & brought into the operating room. The patient was positioned supine with arms tucked. SCDs were active during the entire case. The patient underwent general anesthesia without any difficulty.  The abdomen was prepped and draped in a sterile fashion. The patient's bladder was emptied.  A Surgical Timeout confirmed our plan.  I made a transverse incision through the inferior umbilical fold.  I made a small transverse nick through the anterior rectus fascia contralateral to the inguinal hernia side and placed a 0-vicryl stitch through the fascia.  I placed a Hasson trocar into the preperitoneal plane.  Entry was clean.  We induced carbon dioxide insufflation. Camera inspection revealed no injury.  I used a 16mm angled scope to bluntly free the peritoneum off the infraumbilical anterior abdominal wall.  I created enough of a preperitoneal pocket to place 76mm ports into the right & left abdomen into this preperitoneal cavity.  I focused attention on the LEFT pelvis since that was the dominant hernia side.   I used blunt & focused sharp dissection to free the peritoneum off the flank and down to the pubic rim.  I freed the anteriolateral bladder wall off the anteriolateral pelvic wall, sparing midline attachments.   I freed the peritoneum off the round ligament.  No obvious inguinal hernia or femoral hernia.  Continued to roll the peritoneum off the retroperitoneum towards the midline until the psoas muscle was exposed medially.  Confirmed with CT scan this was the region of concern.  I did careful blunt focused dissection, hydrodissection, and focused harmonic dissection to free off the fat overlying the retroperitoneum over and lateral to the psoas towards the left flank and left lower  quadrant.  I could identify the bony iliac crest.  We freed off the fatty tissue off this region to skeletonize it off the iliac crest to expose the iliacus muscles and retroperitoneal paraspinal musculature.  Could follow that more inferiorly.  I cannot find a satisfying discrete mass.  Correlated with the CT scan.  Reviewed with my partners intraoperatively.  I used intraoperative ultrasound and confirmed no discrete mass overlying the iliac crest or iliac [place] of concern despite were correlated with the CT PET.  Nothing concerning on the psoas.  Came up more cephalad until I was at the Gerota's fascia inferior pole which was clearly cephalad to this retroperitoneal mass.  Cleaned off the quadratus lumborum and saw no area of concern.  Concerned that this 2cm mass was inferior to the iliac crest laying on the inside of the iliac bone and not quite to the sacrum.  Carefully freed off the fatty soft tissue contents in this region and placed into EcoSac x2.  I did washout of several liters and serial aliquots to assure hemostasis.  There was a slightly fibrotic area on the iliacus muscle and psoas that I freed off some muscle off of.  Took care to preserve the ilioinguinal, lateral femoral cutaneous and other nerves running in this region  Confirmed nothing abnormal in the inguinal region.  I did not do an aggressive iliac dissection since clearly this was not on the iliacs and was lateral to it.  Confirmed and could follow the superior iliac crest from the anterior superior spine to the posterior spine and follow it more distally and still cannot find any definite discrete harm, firm fibrotic  mass.  It seemed to be cephalad to the majority of the iliac [place] muscle but nothing on the superior iliac bone could be discretely noted aside from the fatty soft tissue contents that I had freed off.  I could gently dissect & probe into the iliacus muscle down to the iliac bone and confirmed no discrete or concerning mass or  fibrotic region.  At this point I did not feel it was appropriate to do a big open incision for the small retroperitoneal mass that we had a good dissection of in the region.  Although it looked as if the same consistency as muscle, the muscle tissue was soft and there was nothing as hard or hard or that was concerning.  Muscle tissue was rather soft.  I decided to stop and send all the contents of the soft tissue region that were primarily lipomatous to pathology to see if they had could possibly identify within the contents an area of concern.  I did irrigation.  Hemostasis was good.  Given the extensive dissection I decided to leave a drain to be safe.  Carbon oxide removed.  Ports removed.  I closed the infraumbilical fascia with interrupted 0 Vicryl and then #1 PDS suture to good result.  Skin closed with Monocryl.  Sterile dressing applied.  Drain secured with 2-0 Prolene suture.  Patient extubated however was stable and ready condition.  I made 2 attempts to call the husband.  First no answer, second Ieft a voicemail.  PACU called back and said he is available.  I again got voicemail.  I will try and call again later today or at the very least discussed in the morning.   Adin Hector, M.D., F.A.C.S. Gastrointestinal and Minimally Invasive Surgery [facility] [address], [place], [place] [address] [phone] Main / Paging  08/11/2021 1:16 PM

## 2021-08-11 NOTE — Discharge Instructions (Addendum)
Please see drain care history instructions below.  Plan to have you return to the office next week for nurse only visit to have the drain removed.  Pathology will take 1 or 2 weeks to come back.  We will let you know.  Please call later next week if you have not heard anything.  ################################################################  LAPAROSCOPIC SURGERY: POST OP INSTRUCTIONS  ######################################################################  EAT Gradually transition to a high fiber diet with a fiber supplement over the next few weeks after discharge.  Start with a pureed / full liquid diet (see below)  WALK Walk an hour a day.  Control your pain to do that.    CONTROL PAIN Control pain so that you can walk, sleep, tolerate sneezing/coughing, go up/down stairs.  HAVE A BOWEL MOVEMENT DAILY Keep your bowels regular to avoid problems.  OK to try a laxative to override constipation.  OK to use an antidairrheal to slow down diarrhea.  Call if not better after 2 tries  CALL IF YOU HAVE PROBLEMS/CONCERNS Call if you are still struggling despite following these instructions. Call if you have concerns not answered by these instructions  ######################################################################    DIET: Follow a light bland diet & liquids the first 24 hours after arrival home, such as soup, liquids, starches, etc.  Be sure to drink plenty of fluids.  Quickly advance to a usual solid diet within a few days.  Avoid fast food or heavy meals as your are more likely to get nauseated or have irregular bowels.  A low-fat, high-fiber diet for the rest of your life is ideal.  Take your usually prescribed home medications unless otherwise directed.  PAIN CONTROL: Pain is best controlled by a usual combination of three different methods TOGETHER: Ice/Heat Over the counter pain medication Prescription pain medication Most patients will experience some swelling and  bruising around the incisions.  Ice packs or heating pads (30-60 minutes up to 6 times a day) will help. Use ice for the first few days to help decrease swelling and bruising, then switch to heat to help relax tight/sore spots and speed recovery.  Some people prefer to use ice alone, heat alone, alternating between ice & heat.  Experiment to what works for you.  Swelling and bruising can take several weeks to resolve.   It is helpful to take an over-the-counter pain medication regularly for the first few weeks.  Choose one of the following that works best for you: Naproxen (Aleve, etc)  Two 220mg  tabs twice a day Ibuprofen (Advil, etc) Three 200mg  tabs four times a day (every meal & bedtime) Acetaminophen (Tylenol, etc) 500-650mg  four times a day (every meal & bedtime) A  prescription for pain medication (such as oxycodone, hydrocodone, tramadol, gabapentin, methocarbamol, etc) should be given to you upon discharge.  Take your pain medication as prescribed.  If you are having problems/concerns with the prescription medicine (does not control pain, nausea, vomiting, rash, itching, etc), please call us [phone] to see if we need to switch you to a different pain medicine that will work better for you and/or control your side effect better. If you need a refill on your pain medication, please give [place] 48 hour notice.  contact your pharmacy.  They will contact our office to request authorization. Prescriptions will not be filled after 5 pm or on week-ends  Avoid getting constipated.   Between the surgery and the pain medications, it is common to experience some constipation.   Increasing fluid intake and taking  a fiber supplement (such as Metamucil, Citrucel, FiberCon, MiraLax, etc) 1-2 times a day regularly will usually help prevent this problem from occurring.   A mild laxative (prune juice, Milk of Magnesia, MiraLax, etc) should be taken according to package directions if there are no bowel movements  after 48 hours.   Watch out for diarrhea.   If you have many loose bowel movements, simplify your diet to bland foods & liquids for a few days.   Stop any stool softeners and decrease your fiber supplement.   Switching to mild anti-diarrheal medications (Kayopectate, Pepto Bismol) can help.   If this worsens or does not improve, please call us.  Wash / shower every day.  You may shower over the dressings as they are waterproof.  Continue to shower over incision(s) after the dressing is off.  REMOVE ALL DRESSINGS: Remove your waterproof bandages (tegaderm clear band-aids, steristrip skin tapes, etc) THREE DAYS AFTER SURGERY.  You may leave the incisions open to air.  You may replace a dressing/Band-Aid to cover the incision for comfort if you wish.   ACTIVITIES as tolerated:   You may resume regular (light) daily activities beginning the next day--such as daily self-care, walking, climbing stairs--gradually increasing activities as tolerated.  If you can walk 30 minutes without difficulty, it is safe to try more intense activity such as jogging, treadmill, bicycling, low-impact aerobics, swimming, etc. Save the most intensive and strenuous activity for last such as sit-ups, heavy lifting, contact sports, etc  Refrain from any heavy lifting or straining until you are off narcotics for pain control.   DO NOT PUSH THROUGH PAIN.  Let pain be your guide: If it hurts to do something, don't do it.  Pain is your body warning you to avoid that activity for another week until the pain goes down. You may drive when you are no longer taking prescription pain medication, you can comfortably wear a seatbelt, and you can safely maneuver your car and apply brakes. You may have sexual intercourse when it is comfortable.  FOLLOW UP in our office Please call [facility] at [phone] to set up an appointment to see your surgeon in the office for a follow-up appointment approximately 2-3 weeks after your surgery. Make  sure that you call for this appointment the day you arrive home to insure a convenient appointment time.  10. IF YOU HAVE DISABILITY OR FAMILY LEAVE FORMS, BRING THEM TO THE OFFICE FOR PROCESSING.  DO NOT GIVE THEM TO YOUR DOCTOR.   WHEN TO CALL us [phone]: Poor pain control Reactions / problems with new medications (rash/itching, nausea, etc)  Fever over 101.5 F (38.5 C) Inability to urinate Nausea and/or vomiting Worsening swelling or bruising Continued bleeding from incision. Increased pain, redness, or drainage from the incision   The clinic staff is available to answer your questions during regular business hours (8:30am-5pm).  Please don't hesitate to call and ask to speak to one of our nurses for clinical concerns.   If you have a medical emergency, go to the nearest emergency room or call 911.  A surgeon from [facility] is always on call at the [facility], [place], [place], [place], [place]  [address] ? MAIN: [phone] ? TOLL FREE: [phone] ?  FAX [phone] [email or web address]  ##############################################################    DRAIN CARE:   You have a closed bulb drain to help you heal.    A bulb drain is a  small, plastic reservoir which creates a gentle suction. It is used to remove excess fluid from a surgical wound. The color and amount of fluid will vary. Immediately after surgery, the fluid is bright red. It may gradually change to a yellow color. When the amount decreases to about 1 or 2 tablespoons (15 to 30 cc) per 24 hours, your caregiver will usually remove it.  JP Care  The Jackson-Pratt drainage system has flexible tubing attached to a soft, plastic bulb with a stopper. The drainage end of the tubing, which is flat and white, goes into your body through a small opening near your incision (surgical cut). A stitch holds the drainage end in place. The rest  of the tube is outside your body, attached to the bulb. When the bulb is compressed with the stopper in place, it creates a vacuum. This causes a constant gentle suction, which helps draw out fluid that collects under your incision. The bulb should be compressed at all times, except when you are emptying the drainage.  How long you will have your Jackson-Pratt depends on your surgery and the amount of fluid is draining. This is different for everyone. The Jackson-Pratt is usually removed when the drainage is 30 mL or less over 24 hours. To keep track of how much drainage you're having, you will record the amount in a drainage log. It's important to bring the log with you to your follow-up appointments.  Caring for Your Jackson-Pratt at Home In order to care for your Jackson-Pratt at home, you or your caregiver will do the following:  Empty the drain once a day and record the color and amount of drainage  Care for the area where the tubing enters your skin by washing with soap and water.  Milk the tubing to help move clots into the bulb.  Do this before you empty and measure your drainage. Look in the mirror at the tubing. This will help you see where your hands need to be. Pinch the tubing close to where it goes into your skin between your thumb and forefinger. With the thumb and forefinger of your other hand, pinch the tubing right below your other fingers. Keep your fingers pinched and slide them down the tubing, pushing any clots down toward the bulb. You may want to use alcohol swabs to help you slide your fingers down the tubing. Repeat steps 3 and 4 as necessary to push clots from the tubing into the bulb. If you are not able to move a clot into the bulb, call your doctor's office. The fluid may leak around the insertion site if a clot is blocking the drainage flow. If there is fluid in the bulb and no leakage at the insertion site, the drain is working.  How to Empty Your Jackson-Pratt and  Record the Drainage You will need to empty your Jackson-Pratt every day  Gather the following supplies:  Measuring container your nurse gave you Jackson-Pratt Drainage Record  Pen or pencil  Instructions Clean an area to work on. Clean your hands thoroughly. Unplug the stopper on top of your Jackson-Pratt. This will cause the bulb to expand. Do not touch the inside of the stopper or the inner area of the opening on the bulb. Turn your Jackson-Pratt upside down, gently squeeze the bulb, and pour the drainage into the measuring container. Turn your Jackson-Pratt right side up. Squeeze the bulb until your fingers feel the palm of your hand. Keep squeezing the bulb while you replug  the stopper. Make sure the bulb stays fully compressed to ensure constant, gentle suction.    Check the amount and color of drainage in the measuring container. The first couple days after surgery the fluid may be dark red. This is normal. As you heal the fluid may look pink or pale yellow. Record this amount and the color of drainage on your Jackson-Pratt Drainage Record. Flush the drainage down the toilet and rinse the measuring container with water.  Caring for the Insertion Site  Once you have emptied the drainage, clean your hands again. Check the area around the insertion site. Look for tenderness, swelling, or pus. If you have any of these, or if you have a temperature of 101 F (38.3 C) or higher, you may have an infection. Call your doctor's office.  Sometimes, the drain causes redness the size of a dime at your insertion site. This is normal. Your healthcare provider will tell you if you should place a bandage over the insertion site.  Wash drain site with soap & water (dilute hydrogen peroxide PRN) daily & replace clean dressing / tape    DAILY CARE Keep the bulb compressed at all times, except while emptying it. The compression creates suction.  Keep sites where the tubes enter the skin dry  and covered with a light bandage (dressing).  Tape the tubes to your skin, 1 to 2 inches below the insertion sites, to keep from pulling on your stitches. Tubes are stitched in place and will not slip out.  Pin the bulb to your shirt (not to your pants) with a safety pin.  For the first few days after surgery, there usually is more fluid in the bulb. Empty the bulb whenever it becomes half full because the bulb does not create enough suction if it is too full. Include this amount in your 24 hour totals.  When the amount of drainage decreases, empty the bulb at the same time every day. Write down the amounts and the 24 hour totals. Your caregiver will want to know them. This helps your caregiver know when the tubes can be removed.  (We anticipate removing the drain in 1-3 weeks, depending on when the output is <78mL a day for 2+ days) If there is drainage around the tube sites, change dressings and keep the area dry. If you see a clot in the tube, leave it alone. However, if the tube does not appear to be draining, let your caregiver know.  TO EMPTY THE BULB Open the stopper to release suction.  Holding the stopper out of the way, pour drainage into the measuring cup that was sent home with you.  Measure and write down the amount. If there are 2 bulbs, note the amount of drainage from bulb 1 or bulb 2 and keep the totals separate. Your caregiver will want to know which tube is draining more.  Compress the bulb by folding it in half.  Replace the stopper.  Check the tape that holds the tube to your skin, and pin the bulb to your shirt.  SEEK MEDICAL CARE IF: The drainage develops a bad odor.  You have an oral temperature above 102 F (38.9 C).  The amount of drainage from your wound suddenly increases or decreases.  You accidentally pull out your drain.  You have any other questions or concerns.  MAKE SURE YOU:  Understand these instructions.  Will watch your condition.  Will get help right  away if you are not doing  well or get worse.    Call our office if you have any questions about your drain. [phone]

## 2021-08-11 NOTE — Interval H&P Note (Signed)
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  The various methods of treatment have been discussed with the patient and family. After consideration of risks, benefits and other options for treatment, the patient has consented to  Procedure(s): LAPAROSCOPIC EXPLORATION (N/A) EXCISIONAL BIOPSY OF LEFT RETROPERITONEAL MASS, POSSIBLE INTRAOPERATIVE LAPAROSCOPIC ULTRASOUND (N/A) as a surgical intervention.  The patient's history has been reviewed, patient examined, no change in status, stable for surgery.  I have reviewed the patient's chart and labs.  Questions were answered to the patient's satisfaction.    I have re-reviewed the the patient's records, history, medications, and allergies.  I have re-examined the patient.  I again discussed intraoperative plans and goals of post-operative recovery.  The patient agrees to proceed.  ANESHA HACKERT  01/04/1946 [id number]  Patient Care Team: [name], MD as PCP - General (Internal Medicine) Sharol Roussel, [place] as Physician Assistant (Optometry) Jettie Booze, MD as Consulting Physician (Interventional Cardiology) Teena Irani, MD (Inactive) as Consulting Physician (Gastroenterology) Druscilla Brownie, MD as Consulting Physician (Dermatology) Clent Jacks, MD as Consulting Physician (Ophthalmology) Erroll Luna, MD as Consulting Physician (General Surgery) Melrose Nakayama, MD as Consulting Physician (Orthopedic Surgery)  Patient Active Problem List   Diagnosis Date Noted   Chest pain 07/05/2021   Osteopenia 05/18/2021   Obesity (BMI 30.0-34.9) 05/16/2021   Labile hypertension 10/07/2019   Aortic atherosclerosis ([place]) by Abd CT scan on 01/26/2-021 05/29/2016   Encounter for antineoplastic chemotherapy 10/19/2015   Adenocarcinoma of left lung, stage 4 ([place]) 04/05/2015   Medication management  11/03/2014   Vitamin D deficiency 10/21/2013   Hypothyroidism 10/21/2013   Essential hypertension 10/21/2013   Hyperlipidemia, mixed 10/21/2013   Abnormal glucose    Anxiety    Insomnia     Past Medical History:  Diagnosis Date   Adenocarcinoma of left lung, stage 4 ([place]) 04/05/2015   Biopsy confirmed CT SCAN: There are innumerable bilateral pulmonary nodules. These range in size from about 5 mm to 2 cm. They demonstrate irregular indistinct borders, the larger ones demonstrating spiculation. PET SCAN: Diffuse pulmonary metastatic disease with a 4 cm dominant left lower low lung lesion. No enlarged or hypermetabolic mediastinal or hilar adenopathy.   Allergy    Anxiety    Cancer ([place])    Change of skin related to chemotherapy 09/25/2017   Drug-induced skin rash 01/17/2016   History of radiation therapy 12/18/2019   SBRT left lung  12/11/2019-12/18/2019   Dr Gery Pray   History of radiation therapy 06/21/2021   left lung 06/14/2021-06/21/2021 Dr Gery Pray   Hyperlipidemia    Hypertension    Hypothyroidism    Insomnia    Prediabetes    Thyroid disease     Past Surgical History:  Procedure Laterality Date   ABDOMINAL HYSTERECTOMY  10/23/1993   w BSO   CYST REMOVAL HAND Right    TONSILLECTOMY AND ADENOIDECTOMY      Social History   Socioeconomic History   Marital status: Married    Spouse name: Not on file   Number of children: Not on file   Years of education: Not on file   Highest education level: Not on file  Occupational History   Not on file  Tobacco Use   Smoking status: Former    Packs/day: 0.50    Years: 10.00    Pack years: 5.00    Types: Cigarettes  Quit date: 10/23/1974    Years since quitting: 46.8   Smokeless tobacco: Never  Vaping Use   Vaping Use: Never used  Substance and Sexual Activity   Alcohol use: No   Drug use: No   Sexual activity: Not on file  Other Topics Concern   Not on file  Social History Narrative   Not on file   Social  Determinants of Health   Financial Resource Strain: Not on file  Food Insecurity: Not on file  Transportation Needs: Not on file  Physical Activity: Not on file  Stress: Not on file  Social Connections: Not on file  Intimate Partner Violence: Not on file    Family History  Problem Relation Age of Onset   Liver disease Mother    Alcohol abuse Mother    ALS Father    Hypertension Father    Dementia Maternal Grandmother 94   Lung disease Maternal Grandfather    Lung cancer Paternal Grandmother 44   Alcohol abuse Paternal Grandfather    Heart disease Paternal Grandfather    Melanoma Paternal Aunt    Bone cancer Maternal Uncle    Dementia Paternal Aunt 90   Breast cancer Cousin     Medications Prior to Admission  Medication Sig Dispense Refill Last Dose   afatinib dimaleate (GILOTRIF) 30 MG tablet TAKE 1 TABLET (30MG ) BY MOUTH ONCE DAILY. TAKE ON AN EMPTY STOMACH 1 HOUR BEFORE OR 2 HOURS AFTER A MEAL 30 tablet 2 08/10/2021   ALPRAZolam (XANAX) 1 MG tablet Take 1 mg by mouth at bedtime.   08/10/2021   aspirin 81 MG tablet Take 81 mg by mouth at bedtime.    08/09/2021   ezetimibe (ZETIA) 10 MG tablet Take 1 tablet (10 mg total) by mouth daily. 90 tablet 3 08/10/2021   hydrochlorothiazide (HYDRODIURIL) 25 MG tablet Take 25 mg by mouth daily.   08/10/2021   levothyroxine (SYNTHROID) 50 MCG tablet TAKE 1 TABLET BY MOUTH DAILY ON AN EMPTY STOMACH WITH ONLY WATER FOR 30 MINUTES. NO ANTACID, CALCIUM, OR MAGNESIUM FOR 4 HOURS. AVOID BIOTIN (Patient taking differently: Take 50 mcg by mouth daily before breakfast. NO ANTACID, CALCIUM, OR MAGNESIUM FOR 4 HOURS. AVOID BIOTIN) 90 tablet 3 08/11/2021 at 0530   montelukast (SINGULAIR) 10 MG tablet TAKE 1 TABLET BY MOUTH DAILY FOR ALLERGIES 90 tablet 3 08/10/2021   ibuprofen (ADVIL,MOTRIN) 200 MG tablet Take 400 mg by mouth every 6 (six) hours as needed for headache or moderate pain.   More than a month   loratadine-pseudoephedrine (CLARITIN-D  12-HOUR) 5-120 MG tablet Take 1 tablet by mouth 2 (two) times daily as needed for allergies.   More than a month   melatonin 5 MG TABS Take 5 mg by mouth at bedtime.   More than a month    Current Facility-Administered Medications  Medication Dose Route Frequency Provider Last Rate Last Admin   bupivacaine liposome (EXPAREL) 1.3 % injection 266 mg  20 mL Infiltration Once Michael Boston, MD       Chlorhexidine Gluconate Cloth 2 % PADS 6 each  6 each Topical Once Michael Boston, MD       And   Chlorhexidine Gluconate Cloth 2 % PADS 6 each  6 each Topical Once Noni Stonesifer, Remo Lipps, MD       clindamycin (CLEOCIN) IVPB 900 mg  900 mg Intravenous On Call to OR Michael Boston, MD       And   gentamicin (GARAMYCIN) 280 mg in dextrose 5 %  100 mL IVPB  5 mg/kg (Adjusted) Intravenous On Call to OR Michael Boston, MD       [START ON 08/12/2021] feeding supplement (ENSURE PRE-SURGERY) liquid 296 mL  296 mL Oral Once Michael Boston, MD       lactated ringers infusion   Intravenous Continuous Josephine Igo, MD 10 mL/hr at 08/11/21 0708 Restarted at 08/11/21 0802     Allergies  Allergen Reactions   Penicillins Swelling and Rash    BP (!) 148/84   Pulse 83   Temp 97.6 F (36.4 C) (Oral)   Resp 15   Ht 5' (1.524 m)   Wt 73 kg   SpO2 97%   BMI 31.43 kg/m   Labs: No results found for this or any previous visit (from the past 48 hour(s)).  Imaging / Studies: No results found.   Adin Hector, M.D., F.A.C.S. Gastrointestinal and Minimally Invasive Surgery [facility] [address] [place], [place] [address] [phone] Main / Paging  08/11/2021 8:52 AM    Adin Hector

## 2021-08-11 NOTE — Anesthesia Procedure Notes (Signed)
Procedure Name: Intubation Date/Time: 08/11/2021 9:30 AM Performed by: Claudia Desanctis, CRNA Pre-anesthesia Checklist: Patient identified, Emergency Drugs available, Suction available and Patient being monitored Patient Re-evaluated:Patient Re-evaluated prior to induction Oxygen Delivery Method: Circle system utilized Preoxygenation: Pre-oxygenation with 100% oxygen Induction Type: IV induction Ventilation: Mask ventilation without difficulty Laryngoscope Size: 2 and Miller Grade View: Grade I Tube type: Oral Tube size: 7.0 mm Number of attempts: 1 Airway Equipment and Method: Stylet Placement Confirmation: ETT inserted through vocal cords under direct vision, positive ETCO2 and breath sounds checked- equal and bilateral Secured at: 21 cm Tube secured with: Tape Dental Injury: Teeth and Oropharynx as per pre-operative assessment  Difficulty Due To: Difficult Airway- due to reduced neck mobility and Difficult Airway- due to dentition

## 2021-08-11 NOTE — Anesthesia Postprocedure Evaluation (Signed)
Anesthesia Post Note  Patient: Elizabeth [name]  Procedure(s) Performed: LAPAROSCOPIC EXPLORATION (Abdomen) EXCISIONAL BIOPSY OF LEFT RETROPERITONEAL MASS, POSSIBLE INTRAOPERATIVE LAPAROSCOPIC ULTRASOUND (Abdomen)     Patient location during evaluation: PACU Anesthesia Type: General Level of consciousness: awake and alert and oriented Pain management: pain level controlled Vital Signs Assessment: post-procedure vital signs reviewed and stable Respiratory status: spontaneous breathing, nonlabored ventilation and respiratory function stable Cardiovascular status: blood pressure returned to baseline and stable Postop Assessment: no apparent nausea or vomiting Anesthetic complications: no   No notable events documented.  Last Vitals:  Vitals:   08/11/21 1455 08/11/21 1515  BP:  107/64  Pulse: 78 73  Resp:  17  Temp: (!) 36.1 C 37.1 C  SpO2: 100% 98%    Last Pain:  Vitals:   08/11/21 1455  TempSrc:   PainSc: Asleep                 Elijan Googe A.

## 2021-08-12 ENCOUNTER — Other Ambulatory Visit: Payer: Self-pay

## 2021-08-12 ENCOUNTER — Encounter (HOSPITAL_COMMUNITY): Payer: Self-pay | Admitting: Surgery

## 2021-08-12 DIAGNOSIS — C8513 Unspecified B-cell lymphoma, intra-abdominal lymph nodes: Secondary | ICD-10-CM | POA: Diagnosis not present

## 2021-08-12 LAB — CBC
HCT: 28.1 % — ABNORMAL LOW (ref 36.0–46.0)
Hemoglobin: 9.2 g/dL — ABNORMAL LOW (ref 12.0–15.0)
MCH: 28.2 pg (ref 26.0–34.0)
MCHC: 32.7 g/dL (ref 30.0–36.0)
MCV: 86.2 fL (ref 80.0–100.0)
Platelets: 182 10*3/uL (ref 150–400)
RBC: 3.26 MIL/uL — ABNORMAL LOW (ref 3.87–5.11)
RDW: 13.4 % (ref 11.5–15.5)
WBC: 10.2 10*3/uL (ref 4.0–10.5)
nRBC: 0 % (ref 0.0–0.2)

## 2021-08-12 LAB — BASIC METABOLIC PANEL
Anion gap: 9 (ref 5–15)
BUN: 17 mg/dL (ref 8–23)
CO2: 26 mmol/L (ref 22–32)
Calcium: 8.3 mg/dL — ABNORMAL LOW (ref 8.9–10.3)
Chloride: 96 mmol/L — ABNORMAL LOW (ref 98–111)
Creatinine, Ser: 0.94 mg/dL (ref 0.44–1.00)
GFR, Estimated: >60 mL/min (ref >60)
Glucose, Bld: 138 mg/dL — ABNORMAL HIGH (ref 70–99)
Potassium: 4.4 mmol/L (ref 3.5–5.1)
Sodium: 131 mmol/L — ABNORMAL LOW (ref 135–145)

## 2021-08-12 LAB — MAGNESIUM: Magnesium: 1.7 mg/dL (ref 1.7–2.4)

## 2021-08-12 MED ORDER — TRAMADOL HCL 50 MG PO TABS: 50.0000 mg | ORAL_TABLET | Freq: Four times a day (QID) | ORAL | 0 refills | Status: DC | PRN

## 2021-08-12 NOTE — Plan of Care (Signed)
Instructions were reviewed with patient. All questions were answered. Patient was transported to main entrance by wheelchair. ° °

## 2021-08-12 NOTE — Discharge Summary (Signed)
Physician Discharge Summary    Patient ID: Elizabeth [name] MRN: [id number] DOB/AGE: Apr 01, 1946  75 y.o.  Patient Care Team: [name], MD as PCP - General (Internal Medicine) Sharol Roussel, [place] as Physician Assistant (Optometry) Jettie Booze, MD as Consulting Physician (Interventional Cardiology) Teena Irani, MD (Inactive) as Consulting Physician (Gastroenterology) Druscilla Brownie, MD as Consulting Physician (Dermatology) Clent Jacks, MD as Consulting Physician (Ophthalmology) Erroll Luna, MD as Consulting Physician (General Surgery) Melrose Nakayama, MD as Consulting Physician (Orthopedic Surgery) Curt Bears, MD as Consulting Physician (Oncology)  Admit date: 08/11/2021  Discharge date: 08/12/2021  Hospital Stay = 0 days    Discharge Diagnoses:  Active Problems:   Metastatic lung cancer (metastasis from lung to other site) ([place])   1 Day Post-Op  08/11/2021  POST-OPERATIVE DIAGNOSIS:  LEFT RETROPERITONEAL MASS. POSSIBLE LUNG METASTASIS   PROCEDURE:   LAPAROSCOPIC PREPERITONEAL/RETROPERITONEAL EXPLORATION EXCISIONAL BIOPSY OF LEFT RETROPERITONEAL SOFT TISSUE CONTENTS INTRAOPERATIVE LAPAROSCOPIC ULTRASOUND   SURGEON:  Adin Hector, MD    SURGEON:    Surgeon(s): Michael Boston, MD  Consults: None  Hospital Course:   The patient underwent the surgery above.  Postoperatively, the patient gradually mobilized and advanced to a solid diet.  Pain and other symptoms were treated aggressively.    By the time of discharge, the patient was walking well the hallways, eating food, having flatus.  Pain was well-controlled on an oral medications.  Based on meeting discharge criteria and continuing to recover, I felt it was safe for the patient to be discharged from the hospital to further recover with close followup. Postoperative recommendations were discussed in detail.  They are written as well.  Discharged Condition: good  Discharge  Exam: Blood pressure 124/64, pulse 75, temperature 98 F (36.7 C), temperature source Oral, resp. rate 18, height 5' (1.524 m), weight 73 kg, SpO2 97 %.  General: Pt awake/alert/oriented x4 in No acute distress Eyes: PERRL, normal EOM.  Sclera clear.  No icterus Neuro: CN II-XII intact w/o focal sensory/motor deficits. Lymph: No head/neck/groin lymphadenopathy Psych:  No delerium/psychosis/paranoia HENT: Normocephalic, Mucus membranes moist.  No thrush Neck: Supple, No tracheal deviation Chest:  No chest wall pain w good excursion CV:  Pulses intact.  Regular rhythm MS: Normal AROM mjr joints.  No obvious deformity Abdomen: Soft.  Nondistended.  Nontender.  No evidence of peritonitis.  Drain with serosanguineous fluid no incarcerated hernias. Ext:  SCDs BLE.  No mjr edema.  No cyanosis Skin: No petechiae / purpura   Disposition:    Follow-up Information     Michael Boston, MD Follow up in 3 week(s).   Specialties: General Surgery, Colon and Rectal Surgery Why: To follow up after your operation Contact information: [place] [address] [phone]         [facility], [place] Follow up in 1 week(s).   Specialty: General Surgery Why: To have your drain removed & incisions re-checked Contact information: [address] [place] [phone]                Discharge disposition: 01-Home or Self Care       Discharge Instructions     Call MD for:   Complete by: As directed    FEVER > 101.5 F  (temperatures < 101.5 F are not significant)   Call MD for:  extreme fatigue   Complete by: As directed    Call MD for:  persistant dizziness or light-headedness  Complete by: As directed    Call MD for:  persistant nausea and vomiting   Complete by: As directed    Call MD for:  redness, tenderness, or signs of infection (pain, swelling, redness, odor or green/yellow discharge around  incision site)   Complete by: As directed    Call MD for:  severe uncontrolled pain   Complete by: As directed    Diet - low sodium heart healthy   Complete by: As directed    Start with a bland diet such as soups, liquids, starchy foods, low fat foods, etc. the first few days at home. Gradually advance to a solid, low-fat, high fiber diet by the end of the first week at home.   Add a fiber supplement to your diet (Metamucil, etc) If you feel full, bloated, or constipated, stay on a full liquid or pureed/blenderized diet for a few days until you feel better and are no longer constipated.   Discharge instructions   Complete by: As directed    See Discharge Instructions If you are not getting better after two weeks or are noticing you are getting worse, contact our office [phone] for further advice.  We may need to adjust your medications, re-evaluate you in the office, send you to the emergency room, or see what other things we can do to help. The clinic staff is available to answer your questions during regular business hours (8:30am-5pm).  Please don't hesitate to call and ask to speak to one of our nurses for clinical concerns.    A surgeon from [facility] is always on call at the hospitals 24 hours/day If you have a medical emergency, go to the nearest emergency room or call 911.   Discharge wound care:   Complete by: As directed    It is good for closed incisions and even open wounds to be washed every day.  Shower every day.  Short baths are fine.  Wash the incisions and wounds clean with soap & water.    You may leave closed incisions open to air if it is dry.   You may cover the incision with clean gauze & replace it after your daily shower for comfort.  DRAIN:  You have a drain in place.  Every day change the dressing in the shower, wash around the skin exit site with soap & water and place a new dressing of gauze or band aid around the skin every day.  Keep the  drain site clean & dry.  Follow up in our surgery office next week for drain removal in the near future  TEGADERM:  You have clear gauze band-aid dressings over your closed incision(s).  Remove the dressings 3 days after surgery.   Driving Restrictions   Complete by: As directed    You may drive when: - you are no longer taking narcotic prescription pain medication - you can comfortably wear a seatbelt - you can safely make sudden turns/stops without pain.   Increase activity slowly   Complete by: As directed    Start light daily activities --- self-care, walking, climbing stairs- beginning the day after surgery.  Gradually increase activities as tolerated.  Control your pain to be active.  Stop when you are tired.  Ideally, walk several times a day, eventually an hour a day.   Most people are back to most day-to-day activities in a few weeks.  It takes 4-6 weeks to get back to unrestricted, intense activity. If you can  walk 30 minutes without difficulty, it is safe to try more intense activity such as jogging, treadmill, bicycling, low-impact aerobics, swimming, etc. Save the most intensive and strenuous activity for last (Usually 4-8 weeks after surgery) such as sit-ups, heavy lifting, contact sports, etc.  Refrain from any intense heavy lifting or straining until you are off narcotics for pain control.  You will have off days, but things should improve week-by-week. DO NOT PUSH THROUGH PAIN.  Let pain be your guide: If it hurts to do something, don't do it.   Lifting restrictions   Complete by: As directed    If you can walk 30 minutes without difficulty, it is safe to try more intense activity such as jogging, treadmill, bicycling, low-impact aerobics, swimming, etc. Save the most intensive and strenuous activity for last (Usually 4-8 weeks after surgery) such as sit-ups, heavy lifting, contact sports, etc.   Refrain from any intense heavy lifting or straining until you are off narcotics for  pain control.  You will have off days, but things should improve week-by-week. DO NOT PUSH THROUGH PAIN.  Let pain be your guide: If it hurts to do something, don't do it.  Pain is your body warning you to avoid that activity for another week until the pain goes down.   May shower / Bathe   Complete by: As directed    May walk up steps   Complete by: As directed    Remove dressing in 72 hours   Complete by: As directed    Make sure all dressings are removed by the third day after surgery.  Leave incisions open to air.  OK to cover incisions with gauze or bandages as desired   Sexual Activity Restrictions   Complete by: As directed    You may have sexual intercourse when it is comfortable. If it hurts to do something, stop.       Allergies as of 08/12/2021       Reactions   Penicillins Swelling, Rash        Medication List     TAKE these medications    ALPRAZolam 1 MG tablet Commonly known as: XANAX Take 1 mg by mouth at bedtime.   aspirin 81 MG tablet Take 81 mg by mouth at bedtime.   ezetimibe 10 MG tablet Commonly known as: Zetia Take 1 tablet (10 mg total) by mouth daily.   Gilotrif 30 MG tablet Generic drug: afatinib dimaleate TAKE 1 TABLET (30MG ) BY MOUTH ONCE DAILY. TAKE ON AN EMPTY STOMACH 1 HOUR BEFORE OR 2 HOURS AFTER A MEAL   hydrochlorothiazide 25 MG tablet Commonly known as: HYDRODIURIL Take 25 mg by mouth daily.   ibuprofen 200 MG tablet Commonly known as: ADVIL Take 400 mg by mouth every 6 (six) hours as needed for headache or moderate pain.   levothyroxine 50 MCG tablet Commonly known as: SYNTHROID TAKE 1 TABLET BY MOUTH DAILY ON AN EMPTY STOMACH WITH ONLY WATER FOR 30 MINUTES. NO ANTACID, CALCIUM, OR MAGNESIUM FOR 4 HOURS. AVOID BIOTIN What changed:  how much to take how to take this when to take this additional instructions   loratadine-pseudoephedrine 5-120 MG tablet Commonly known as: CLARITIN-D 12-hour Take 1 tablet by mouth 2 (two)  times daily as needed for allergies.   melatonin 5 MG Tabs Take 5 mg by mouth at bedtime.   montelukast 10 MG tablet Commonly known as: SINGULAIR TAKE 1 TABLET BY MOUTH DAILY FOR ALLERGIES   traMADol 50 MG tablet Commonly  known as: ULTRAM Take 1-2 tablets (50-100 mg total) by mouth every 6 (six) hours as needed for moderate pain or severe pain.               Discharge Care Instructions  (From admission, onward)           Start     Ordered   08/12/21 0000  Discharge wound care:       Comments: It is good for closed incisions and even open wounds to be washed every day.  Shower every day.  Short baths are fine.  Wash the incisions and wounds clean with soap & water.    You may leave closed incisions open to air if it is dry.   You may cover the incision with clean gauze & replace it after your daily shower for comfort.  DRAIN:  You have a drain in place.  Every day change the dressing in the shower, wash around the skin exit site with soap & water and place a new dressing of gauze or band aid around the skin every day.  Keep the drain site clean & dry.  Follow up in our surgery office next week for drain removal in the near future  TEGADERM:  You have clear gauze band-aid dressings over your closed incision(s).  Remove the dressings 3 days after surgery.   08/12/21 0756            Significant Diagnostic Studies:  Results for orders placed or performed during the hospital encounter of 08/11/21 (from the past 72 hour(s))  Glucose, capillary     Status: Abnormal   Collection Time: 08/11/21  1:37 PM  Result Value Ref Range   Glucose-Capillary 154 (H) 70 - 99 mg/dL    Comment: Glucose reference range applies only to samples taken after fasting for at least 8 hours.  Basic metabolic panel     Status: Abnormal   Collection Time: 08/12/21  4:04 AM  Result Value Ref Range   Sodium 131 (L) 135 - 145 mmol/L   Potassium 4.4 3.5 - 5.1 mmol/L   Chloride 96 (L) 98 - 111 mmol/L    CO2 26 22 - 32 mmol/L   Glucose, Bld 138 (H) 70 - 99 mg/dL    Comment: Glucose reference range applies only to samples taken after fasting for at least 8 hours.   BUN 17 8 - 23 mg/dL   Creatinine, Ser 0.94 0.44 - 1.00 mg/dL   Calcium 8.3 (L) 8.9 - 10.3 mg/dL   GFR, Estimated >60 >60 mL/min    Comment: (NOTE) Calculated using the CKD-EPI Creatinine Equation (2021)    Anion gap 9 5 - 15    Comment: Performed at [facility], [place] [address] [place], [place] [address]  Magnesium     Status: None   Collection Time: 08/12/21  4:04 AM  Result Value Ref Range   Magnesium 1.7 1.7 - 2.4 mg/dL    Comment: Performed at [facility], [place] [address] [place], [place] [address]  CBC     Status: Abnormal   Collection Time: 08/12/21  4:04 AM  Result Value Ref Range   WBC 10.2 4.0 - 10.5 K/uL   RBC 3.26 (L) 3.87 - 5.11 MIL/uL   Hemoglobin 9.2 (L) 12.0 - 15.0 g/dL   HCT 28.1 (L) 36.0 - 46.0 %   MCV 86.2 80.0 - 100.0 fL   MCH 28.2 26.0 - 34.0 pg   MCHC 32.7 30.0 -  36.0 g/dL   RDW 13.4 11.5 - 15.5 %   Platelets 182 150 - 400 K/uL   nRBC 0.0 0.0 - 0.2 %    Comment: Performed at [facility], [place] [address] [place], [place] [address]    No results found.  Past Medical History:  Diagnosis Date   Adenocarcinoma of left lung, stage 4 ([place]) 04/05/2015   Biopsy confirmed CT SCAN: There are innumerable bilateral pulmonary nodules. These range in size from about 5 mm to 2 cm. They demonstrate irregular indistinct borders, the larger ones demonstrating spiculation. PET SCAN: Diffuse pulmonary metastatic disease with a 4 cm dominant left lower low lung lesion. No enlarged or hypermetabolic mediastinal or hilar adenopathy.   Allergy    Anxiety    Cancer ([place])    Change of skin related to chemotherapy 09/25/2017   Drug-induced skin rash 01/17/2016   History of radiation therapy 12/18/2019   SBRT left lung  12/11/2019-12/18/2019   Dr Gery Pray    History of radiation therapy 06/21/2021   left lung 06/14/2021-06/21/2021 Dr Gery Pray   Hyperlipidemia    Hypertension    Hypothyroidism    Insomnia    Prediabetes    Thyroid disease     Past Surgical History:  Procedure Laterality Date   ABDOMINAL HYSTERECTOMY  10/23/1993   w BSO   CYST REMOVAL HAND Right    EXCISION MASS ABDOMINAL N/A 08/11/2021   Procedure: EXCISIONAL BIOPSY OF LEFT RETROPERITONEAL MASS, POSSIBLE INTRAOPERATIVE LAPAROSCOPIC ULTRASOUND;  Surgeon: Michael Boston, MD;  Location: [facility];  Service: General;  Laterality: N/A;   LAPAROSCOPY N/A 08/11/2021   Procedure: LAPAROSCOPIC EXPLORATION;  Surgeon: Michael Boston, MD;  Location: [facility];  Service: General;  Laterality: N/A;   TONSILLECTOMY AND ADENOIDECTOMY      Social History   Socioeconomic History   Marital status: Married    Spouse name: Not on file   Number of children: Not on file   Years of education: Not on file   Highest education level: Not on file  Occupational History   Not on file  Tobacco Use   Smoking status: Former    Packs/day: 0.50    Years: 10.00    Pack years: 5.00    Types: Cigarettes    Quit date: 10/23/1974    Years since quitting: 46.8   Smokeless tobacco: Never  Vaping Use   Vaping Use: Never used  Substance and Sexual Activity   Alcohol use: No   Drug use: No   Sexual activity: Not on file  Other Topics Concern   Not on file  Social History Narrative   Not on file   Social Determinants of Health   Financial Resource Strain: Not on file  Food Insecurity: Not on file  Transportation Needs: Not on file  Physical Activity: Not on file  Stress: Not on file  Social Connections: Not on file  Intimate Partner Violence: Not on file    Family History  Problem Relation Age of Onset   Liver disease Mother    Alcohol abuse Mother    ALS Father    Hypertension Father    Dementia Maternal Grandmother 94   Lung disease Maternal Grandfather    Lung cancer Paternal  Grandmother 5   Alcohol abuse Paternal Grandfather    Heart disease Paternal Grandfather    Melanoma Paternal Aunt    Bone cancer Maternal Uncle    Dementia Paternal Aunt 90   Breast cancer Cousin  Current Facility-Administered Medications  Medication Dose Route Frequency Provider Last Rate Last Admin   0.9 %  sodium chloride infusion   Intravenous Q8H PRN Mercer Peifer, Remo Lipps, MD       0.9 %  sodium chloride infusion  250 mL Intravenous PRN Michael Boston, MD       acetaminophen (TYLENOL) tablet 1,000 mg  1,000 mg Oral Lajuana Ripple, MD   1,000 mg at 08/12/21 2505   ALPRAZolam Duanne Moron) tablet 1 mg  1 mg Oral Ardeen Fillers, MD   1 mg at 08/11/21 2206   aspirin EC tablet 81 mg  81 mg Oral Ardeen Fillers, MD   81 mg at 08/11/21 2100   bisacodyl (DULCOLAX) suppository 10 mg  10 mg Rectal Daily PRN Michael Boston, MD       diphenhydrAMINE (BENADRYL) 12.5 MG/5ML elixir 12.5 mg  12.5 mg Oral Q6H PRN Michael Boston, MD       Or   diphenhydrAMINE (BENADRYL) injection 12.5 mg  12.5 mg Intravenous Q6H PRN Michael Boston, MD       diphenhydrAMINE (BENADRYL) injection 12.5-25 mg  12.5-25 mg Intravenous Q6H PRN Michael Boston, MD       enoxaparin (LOVENOX) injection 40 mg  40 mg Subcutaneous Q24H Michael Boston, MD       ezetimibe (ZETIA) tablet 10 mg  10 mg Oral Daily Michael Boston, MD   10 mg at 08/11/21 2100   gabapentin (NEURONTIN) capsule 300 mg  300 mg Oral BID Michael Boston, MD   300 mg at 08/11/21 2100   hydrochlorothiazide (HYDRODIURIL) tablet 25 mg  25 mg Oral Daily Michael Boston, MD   25 mg at 08/11/21 2100   ibuprofen (ADVIL) tablet 400 mg  400 mg Oral Q6H PRN Michael Boston, MD       lactated ringers bolus 1,000 mL  1,000 mL Intravenous Q8H PRN Michael Boston, MD       levothyroxine (SYNTHROID) tablet 50 mcg  50 mcg Oral QAC breakfast Michael Boston, MD   50 mcg at 08/12/21 3976   lip balm (CARMEX) ointment 1 application  1 application Topical BID Michael Boston, MD   1 application at  73/41/93 2055   magic mouthwash  15 mL Oral QID PRN Michael Boston, MD       magnesium hydroxide (MILK OF MAGNESIA) suspension 30 mL  30 mL Oral Daily PRN Michael Boston, MD       melatonin tablet 5 mg  5 mg Oral Ardeen Fillers, MD   5 mg at 08/11/21 2206   methocarbamol (ROBAXIN) 1,000 mg in dextrose 5 % 100 mL IVPB  1,000 mg Intravenous Q6H PRN Michael Boston, MD 200 mL/hr at 08/11/21 1533 1,000 mg at 08/11/21 1533   methocarbamol (ROBAXIN) tablet 500 mg  500 mg Oral Q6H PRN Michael Boston, MD       metoprolol tartrate (LOPRESSOR) injection 5 mg  5 mg Intravenous Q6H PRN Michael Boston, MD       montelukast (SINGULAIR) tablet 10 mg  10 mg Oral Ardeen Fillers, MD   10 mg at 08/11/21 2100   polycarbophil (FIBERCON) tablet 625 mg  625 mg Oral BID Michael Boston, MD   625 mg at 08/11/21 2100   simethicone (MYLICON) chewable tablet 40 mg  40 mg Oral Q6H PRN Michael Boston, MD       sodium chloride flush (NS) 0.9 % injection 3 mL  3 mL Intravenous Gorden Harms, MD   3 mL at 08/11/21  2200   sodium chloride flush (NS) 0.9 % injection 3 mL  3 mL Intravenous PRN Michael Boston, MD       traMADol Veatrice Bourbon) tablet 50 mg  50 mg Oral Q6H PRN Michael Boston, MD   50 mg at 08/11/21 1531     Allergies  Allergen Reactions   Penicillins Swelling and Rash    Signed: Morton Peters, MD, FACS, MASCRS Esophageal, Gastrointestinal & Colorectal Surgery Robotic and Minimally Invasive Surgery  [facility], [place]. [address], [place], [place] [address] [phone] Fax [phone] Main  CONTACT INFORMATION:  Weekday (9AM-5PM): Call [facility] main office at [phone]  Weeknight (5PM-9AM) or Weekend/Holiday: Check [email or web address] (password " [email or web address]") for General Surgery [facility] coverage  (Please, do not use SecureChat as it is not reliable communication to operating surgeons for immediate patient care)      08/12/2021,  7:57 AM

## 2021-08-16 ENCOUNTER — Other Ambulatory Visit (HOSPITAL_COMMUNITY): Payer: Self-pay

## 2021-08-19 NOTE — Progress Notes (Signed)
3 MONTH FOLLOW UP  Assessment and Plan:   Atherosclerosis of aorta ([place]) 10/2019 Control blood pressure, cholesterol, glucose, increase exercise.  Discussed LDL goal <100  Essential hypertension Continue current medications Monitor blood pressure at home; call if consistently over 130/80 Continue DASH diet.   Reminder to go to the ER if any CP, SOB, nausea, dizziness, severe HA, changes vision/speech, left arm numbness and tingling and jaw pain. -     CBC with Differential/Platelet -     COMPLETE METABOLIC PANEL WITH GFR  Hyperlipidemia, mixed Has been off of statin secondary to cancer/chemo Follow up on new start zetia today  Discussed dietary and exercise modifications Low fat diet -     Lipid panel  Acquired hypothyroidism Taking levothyroxine  Reminder to take on an empty stomach 30-54mins before first meal of the day. No antacid medications for 4 hours. -     TSH- defer, just had  Adenocarcinoma of left lung, stage 4 (HCC) Continue follow up with Oncology New B cell follicular lymphoma, pending plan by Dr. Earlie Server   Vitamin D deficiency Continue supplementation to maintain goal of 60-100 Taking Vitamin D 10,000 IU three times a week.  Abnormal glucose Discussed dietary and exercise modifications  Anxiety Well managed by current regimen; continue medications Stress management techniques discussed, increase water, good sleep hygiene discussed, increase exercise, and increase veggies.   Insomnia, unspecified type Discussed good sleep hygiene, decrease stimulation prior to sleep Increase day time activity Avoid caffeine in evenings Uses alprazolam PRN after failing trazodone, gabapentin; understands risks associated with this med and wishes to continue Getting through Dr. Simonne Maffucci -   Obesity (BMI 30-34.9) Discussed dietary and exercise modifications Weight loss encouraged  Medication management Continued  Orders Placed This Encounter  Procedures   CBC  with Differential/Platelet   COMPLETE METABOLIC PANEL WITH GFR   Lipid panel       Over 58min of face to face interview, exam, chart review, counseling preformed this office visit with moderate decision making.  Discussed med's effects and SE's. Screening labs and tests as requested with regular follow-up as recommended. Future Appointments  Date Time Provider [place]  09/06/2021 10:30 AM [facility] None  09/06/2021 11:00 AM Curt Bears, MD [facility] None  11/29/2021  2:00 PM [name], MD [facility] None  05/18/2022 11:00 AM Liane Comber, NP [facility] None    HPI  75 y.o. female  presents for 3 month follow up on weight, lipids, vitamin D.    She has stage 4 lung cancer, on Glotinef maintenance therapy and following with Dr. Julien Nordmann. Recent PET scan showed recurrent tumor with concern for mets. She had biopsy of nodule and referred to Dr. Sondra Come for consideration of SBRT to these 3 suspicious nodules, and recently on 08/11/2021 underwent exploratory lap with resection of pelvic nodule by Dr. Johney Maine, with path showing Non-Hodgkin B-cell lymphoma, consistent with follicular lymphoma. Pending follow up 09/06/2021.   She has anxiety/insomnia, currently prescribed xanax 1 mg by another office (Dr. Kate Sable), tried gabapentin, trazodone without success.   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   BMI is Body mass index is 31.64 kg/m., she has been working on diet and exercise, going to chiropractor to help with back, has new puppy and walking around back yard, can't do leash. She is thinking about joining exercise class, looking into chair yoga with friend.  Wt Readings from Last 3 Encounters:  08/23/21 162 lb (73.5 kg)  08/11/21 160 lb 15 oz (73 kg)  07/25/21 161 lb (73 kg)   Her blood pressure has been controlled at home, today  their BP is BP: 138/74.   She does not workout. She denies chest pain, dizziness. She has exertional dyspnea, unchanged. Improved with dry weather. Denies wheezing/coughing.   She has aortic atherosclerosis per CT 10/2019.   She is not on cholesterol medication (stopped statin several years back with cancer diagnosis, newly on zetia) and denies myalgias. Her cholesterol is not at goal. The cholesterol last visit was:  Lab Results  Component Value Date   CHOL 171 05/18/2021   HDL 51 05/18/2021   LDLCALC 104 (H) 05/18/2021   TRIG 70 05/18/2021   CHOLHDL 3.4 05/18/2021    Last A1C in the office was:  Lab Results  Component Value Date   HGBA1C 5.7 (H) 07/21/2021   Patient is on Vitamin D supplement.   Lab Results  Component Value Date   VD25OH 74 11/18/2020     She is on thyroid medication. Her medication was not changed last visit.She is not on biotin. Takes 50 mcg daily.  Lab Results  Component Value Date   TSH 2.018 07/05/2021    Current Medications:  Current Outpatient Medications on File Prior to Visit  Medication Sig Dispense Refill   afatinib dimaleate (GILOTRIF) 30 MG tablet TAKE 1 TABLET (30MG ) BY MOUTH ONCE DAILY. TAKE ON AN EMPTY STOMACH 1 HOUR BEFORE OR 2 HOURS AFTER A MEAL 30 tablet 2   ALPRAZolam (XANAX) 1 MG tablet Take 1 mg by mouth at bedtime.     aspirin 81 MG tablet Take 81 mg by mouth at bedtime.      ezetimibe (ZETIA) 10 MG tablet Take 1 tablet (10 mg total) by mouth daily. 90 tablet 3   hydrochlorothiazide (HYDRODIURIL) 25 MG tablet Take 25 mg by mouth daily.     ibuprofen (ADVIL,MOTRIN) 200 MG tablet Take 400 mg by mouth every 6 (six) hours as needed for headache or moderate pain.     levothyroxine (SYNTHROID) 50 MCG tablet TAKE 1 TABLET BY MOUTH DAILY ON AN EMPTY STOMACH WITH ONLY WATER FOR 30 MINUTES. NO ANTACID, CALCIUM, OR MAGNESIUM FOR 4 HOURS. AVOID BIOTIN (Patient taking differently: Take 50 mcg by mouth daily before breakfast. NO ANTACID, CALCIUM, OR  MAGNESIUM FOR 4 HOURS. AVOID BIOTIN) 90 tablet 3   loratadine-pseudoephedrine (CLARITIN-D 12-HOUR) 5-120 MG tablet Take 1 tablet by mouth 2 (two) times daily as needed for allergies.     melatonin 5 MG TABS Take 5 mg by mouth at bedtime.     montelukast (SINGULAIR) 10 MG tablet TAKE 1 TABLET BY MOUTH DAILY FOR ALLERGIES 90 tablet 3   traMADol (ULTRAM) 50 MG tablet Take 1-2 tablets (50-100 mg total) by mouth every 6 (six) hours as needed for moderate pain or severe pain. 20 tablet 0   No current facility-administered medications on file prior to visit.     Medical History:  Past Medical History:  Diagnosis Date   Adenocarcinoma of left lung, stage 4 ([place]) 04/05/2015   Biopsy confirmed CT SCAN: There are innumerable bilateral pulmonary nodules. These range in size from about 5 mm to 2 cm. They demonstrate irregular indistinct borders, the larger ones demonstrating spiculation. PET SCAN: Diffuse pulmonary metastatic disease with a 4 cm dominant left lower low lung lesion. No enlarged or hypermetabolic mediastinal or hilar adenopathy.   Allergy  Anxiety    Cancer ([place])    Change of skin related to chemotherapy 09/25/2017   Drug-induced skin rash 01/17/2016   History of radiation therapy 12/18/2019   SBRT left lung  12/11/2019-12/18/2019   Dr Gery Pray   History of radiation therapy 06/21/2021   left lung 06/14/2021-06/21/2021 Dr Gery Pray   Hyperlipidemia    Hypertension    Hypothyroidism    Insomnia    Prediabetes    Thyroid disease    Allergies Allergies  Allergen Reactions   Penicillins Swelling and Rash   SURGICAL HISTORY She  has a past surgical history that includes Tonsillectomy and adenoidectomy; Abdominal hysterectomy (10/23/1993); Cyst removal hand (Right); laparoscopy (N/A, 08/11/2021); and Excision mass abdominal (N/A, 08/11/2021). FAMILY HISTORY Her family history includes ALS in her father; Alcohol abuse in her mother and paternal grandfather; Bone cancer in her  maternal uncle; Breast cancer in her cousin; Dementia (age of onset: 27) in her paternal aunt; Dementia (age of onset: 24) in her maternal grandmother; Heart disease in her paternal grandfather; Hypertension in her father; Liver disease in her mother; Lung cancer (age of onset: 19) in her paternal grandmother; Lung disease in her maternal grandfather; Melanoma in her paternal aunt. SOCIAL HISTORY She  reports that she quit smoking about 46 years ago. Her smoking use included cigarettes. She has a 5.00 pack-year smoking history. She has never used smokeless tobacco. She reports that she does not drink alcohol and does not use drugs.    Review of Systems: Review of Systems  Constitutional:  Negative for chills, fever and malaise/fatigue.  HENT:  Negative for congestion, ear pain and sore throat.   Eyes: Negative.   Respiratory:  Positive for shortness of breath (Chronic since lung cancer). Negative for cough and wheezing.   Cardiovascular:  Negative for chest pain, palpitations and leg swelling.  Gastrointestinal:  Negative for abdominal pain, blood in stool, constipation, diarrhea, heartburn and melena.  Genitourinary: Negative.   Skin:  Negative for itching and rash.  Neurological:  Negative for dizziness, sensory change, loss of consciousness and headaches.  Psychiatric/Behavioral:  Negative for depression, memory loss, substance abuse and suicidal ideas. The patient has insomnia (managing well with meds). The patient is not nervous/anxious.    Physical Exam: Estimated body mass index is 31.64 kg/m as calculated from the following:   Height as of 08/11/21: 5' (1.524 m).   Weight as of this encounter: 162 lb (73.5 kg). BP 138/74   Pulse 94   Temp (!) 97.2 F (36.2 C)   Wt 162 lb (73.5 kg)   SpO2 99%   BMI 31.64 kg/m   General Appearance: Well nourished well developed, in no apparent distress.  Eyes: PERRLA, EOMs, conjunctiva no swelling or erythema ENT/Mouth: Ear canals normal  without obstruction, swelling, erythema, or discharge.  TMs normal bilaterally with no erythema, bulging, retraction, or loss of landmark.  Oropharynx moist and clear with no exudate, erythema, or swelling.  Frontal and maxillary tenderness noted. Neck: Supple, thyroid normal. No bruits.  No cervical adenopathy Respiratory: Respiratory effort normal, Breath sounds without rales.  Bilateral wheezing and rhonchi noted. Cardio: RRR without murmurs, rubs or gallops. Brisk peripheral pulses without edema.  retractions.  Abdomen: Soft, nontender, no guarding, rebound, hernias, masses, or organomegaly. Well healing lap scars x 4 seen without concerning erythema or disharge.  Lymphatics: Non tender without lymphadenopathy.  Musculoskeletal: Full ROM all peripheral extremities,5/5 strength, and normal gait.  Skin:  Non-tender, warm and dry.  Neuro: Awake  and oriented X 3, Cranial nerves intact, reflexes equal bilaterally. Normal muscle tone, no cerebellar symptoms. Sensation intact.  Psych:  normal affect, Insight and Judgment appropriate.    Izora Ribas, NP 12:58 PM [facility]

## 2021-08-23 ENCOUNTER — Ambulatory Visit (INDEPENDENT_AMBULATORY_CARE_PROVIDER_SITE_OTHER): Payer: Medicare Other | Admitting: Adult Health

## 2021-08-23 ENCOUNTER — Other Ambulatory Visit: Payer: Self-pay

## 2021-08-23 ENCOUNTER — Encounter: Payer: Self-pay | Admitting: Adult Health

## 2021-08-23 ENCOUNTER — Other Ambulatory Visit (HOSPITAL_COMMUNITY): Payer: Self-pay

## 2021-08-23 VITALS — BP 138/74 | HR 94 | Temp 97.2°F | Wt 162.0 lb

## 2021-08-23 DIAGNOSIS — C3490 Malignant neoplasm of unspecified part of unspecified bronchus or lung: Secondary | ICD-10-CM | POA: Diagnosis not present

## 2021-08-23 DIAGNOSIS — I700 Atherosclerosis of aorta: Secondary | ICD-10-CM

## 2021-08-23 DIAGNOSIS — I10 Essential (primary) hypertension: Secondary | ICD-10-CM | POA: Diagnosis not present

## 2021-08-23 DIAGNOSIS — C3492 Malignant neoplasm of unspecified part of left bronchus or lung: Secondary | ICD-10-CM

## 2021-08-23 DIAGNOSIS — E669 Obesity, unspecified: Secondary | ICD-10-CM

## 2021-08-23 DIAGNOSIS — E782 Mixed hyperlipidemia: Secondary | ICD-10-CM

## 2021-08-23 DIAGNOSIS — Z79899 Other long term (current) drug therapy: Secondary | ICD-10-CM

## 2021-08-24 ENCOUNTER — Encounter (HOSPITAL_COMMUNITY): Payer: Self-pay | Admitting: Internal Medicine

## 2021-08-24 ENCOUNTER — Other Ambulatory Visit: Payer: Self-pay | Admitting: Adult Health

## 2021-08-24 LAB — CBC WITH DIFFERENTIAL/PLATELET
Absolute Monocytes: 651 cells/uL (ref 200–950)
Basophils Absolute: 49 cells/uL (ref 0–200)
Basophils Relative: 0.7 %
Eosinophils Absolute: 322 cells/uL (ref 15–500)
Eosinophils Relative: 4.6 %
HCT: 32.4 % — ABNORMAL LOW (ref 35.0–45.0)
Hemoglobin: 10.2 g/dL — ABNORMAL LOW (ref 11.7–15.5)
Lymphs Abs: 973 cells/uL (ref 850–3900)
MCH: 27.9 pg (ref 27.0–33.0)
MCHC: 31.5 g/dL — ABNORMAL LOW (ref 32.0–36.0)
MCV: 88.8 fL (ref 80.0–100.0)
MPV: 10.4 fL (ref 7.5–12.5)
Monocytes Relative: 9.3 %
Neutro Abs: 5005 cells/uL (ref 1500–7800)
Neutrophils Relative %: 71.5 %
Platelets: 307 10*3/uL (ref 140–400)
RBC: 3.65 10*6/uL — ABNORMAL LOW (ref 3.80–5.10)
RDW: 13.8 % (ref 11.0–15.0)
Total Lymphocyte: 13.9 %
WBC: 7 10*3/uL (ref 3.8–10.8)

## 2021-08-24 LAB — COMPLETE METABOLIC PANEL WITH GFR
AG Ratio: 1.5 (calc) (ref 1.0–2.5)
ALT: 18 U/L (ref 6–29)
AST: 25 U/L (ref 10–35)
Albumin: 3.8 g/dL (ref 3.6–5.1)
Alkaline phosphatase (APISO): 69 U/L (ref 37–153)
BUN/Creatinine Ratio: 16 (calc) (ref 6–22)
BUN: 16 mg/dL (ref 7–25)
CO2: 28 mmol/L (ref 20–32)
Calcium: 8.8 mg/dL (ref 8.6–10.4)
Chloride: 104 mmol/L (ref 98–110)
Creat: 1.03 mg/dL — ABNORMAL HIGH (ref 0.60–1.00)
Globulin: 2.6 g/dL (calc) (ref 1.9–3.7)
Glucose, Bld: 78 mg/dL (ref 65–99)
Potassium: 3.9 mmol/L (ref 3.5–5.3)
Sodium: 141 mmol/L (ref 135–146)
Total Bilirubin: 0.7 mg/dL (ref 0.2–1.2)
Total Protein: 6.4 g/dL (ref 6.1–8.1)
eGFR: 57 mL/min/{1.73_m2} — ABNORMAL LOW (ref >=60)

## 2021-08-24 LAB — LIPID PANEL
Cholesterol: 172 mg/dL (ref <200)
HDL: 43 mg/dL — ABNORMAL LOW (ref >=50)
LDL Cholesterol (Calc): 104 mg/dL (calc) — ABNORMAL HIGH
Non-HDL Cholesterol (Calc): 129 mg/dL (calc) (ref <130)
Total CHOL/HDL Ratio: 4 (calc) (ref <5.0)
Triglycerides: 148 mg/dL (ref <150)

## 2021-08-27 LAB — SURGICAL PATHOLOGY

## 2021-09-01 ENCOUNTER — Ambulatory Visit: Payer: Medicare Other | Admitting: Interventional Cardiology

## 2021-09-05 DIAGNOSIS — C8510 Unspecified B-cell lymphoma, unspecified site: Secondary | ICD-10-CM

## 2021-09-05 HISTORY — DX: Unspecified B-cell lymphoma, unspecified site: C85.10

## 2021-09-06 ENCOUNTER — Other Ambulatory Visit: Payer: Self-pay

## 2021-09-06 ENCOUNTER — Inpatient Hospital Stay: Payer: Medicare Other

## 2021-09-06 ENCOUNTER — Encounter: Payer: Self-pay | Admitting: *Deleted

## 2021-09-06 ENCOUNTER — Encounter: Payer: Self-pay | Admitting: Internal Medicine

## 2021-09-06 ENCOUNTER — Inpatient Hospital Stay: Payer: Medicare Other | Attending: Internal Medicine | Admitting: Internal Medicine

## 2021-09-06 VITALS — BP 137/73 | HR 80 | Temp 98.1°F | Resp 17 | Ht 60.0 in | Wt 161.7 lb

## 2021-09-06 DIAGNOSIS — E785 Hyperlipidemia, unspecified: Secondary | ICD-10-CM | POA: Diagnosis not present

## 2021-09-06 DIAGNOSIS — E039 Hypothyroidism, unspecified: Secondary | ICD-10-CM | POA: Diagnosis not present

## 2021-09-06 DIAGNOSIS — Z79899 Other long term (current) drug therapy: Secondary | ICD-10-CM | POA: Diagnosis not present

## 2021-09-06 DIAGNOSIS — R21 Rash and other nonspecific skin eruption: Secondary | ICD-10-CM | POA: Diagnosis not present

## 2021-09-06 DIAGNOSIS — Z5111 Encounter for antineoplastic chemotherapy: Secondary | ICD-10-CM

## 2021-09-06 DIAGNOSIS — C792 Secondary malignant neoplasm of skin: Secondary | ICD-10-CM | POA: Insufficient documentation

## 2021-09-06 DIAGNOSIS — C3492 Malignant neoplasm of unspecified part of left bronchus or lung: Secondary | ICD-10-CM

## 2021-09-06 DIAGNOSIS — Z90722 Acquired absence of ovaries, bilateral: Secondary | ICD-10-CM | POA: Diagnosis not present

## 2021-09-06 DIAGNOSIS — R197 Diarrhea, unspecified: Secondary | ICD-10-CM | POA: Insufficient documentation

## 2021-09-06 DIAGNOSIS — Z880 Allergy status to penicillin: Secondary | ICD-10-CM | POA: Insufficient documentation

## 2021-09-06 DIAGNOSIS — I10 Essential (primary) hypertension: Secondary | ICD-10-CM | POA: Diagnosis not present

## 2021-09-06 DIAGNOSIS — Z923 Personal history of irradiation: Secondary | ICD-10-CM | POA: Insufficient documentation

## 2021-09-06 DIAGNOSIS — C3490 Malignant neoplasm of unspecified part of unspecified bronchus or lung: Secondary | ICD-10-CM

## 2021-09-06 DIAGNOSIS — F419 Anxiety disorder, unspecified: Secondary | ICD-10-CM | POA: Insufficient documentation

## 2021-09-06 DIAGNOSIS — C3432 Malignant neoplasm of lower lobe, left bronchus or lung: Secondary | ICD-10-CM | POA: Diagnosis not present

## 2021-09-06 LAB — CMP (CANCER CENTER ONLY)
ALT: 14 U/L (ref 0–44)
AST: 29 U/L (ref 15–41)
Albumin: 3.3 g/dL — ABNORMAL LOW (ref 3.5–5.0)
Alkaline Phosphatase: 70 U/L (ref 38–126)
Anion gap: 8 (ref 5–15)
BUN: 11 mg/dL (ref 8–23)
CO2: 26 mmol/L (ref 22–32)
Calcium: 8.6 mg/dL — ABNORMAL LOW (ref 8.9–10.3)
Chloride: 105 mmol/L (ref 98–111)
Creatinine: 0.83 mg/dL (ref 0.44–1.00)
GFR, Estimated: >60 mL/min (ref >60)
Glucose, Bld: 90 mg/dL (ref 70–99)
Potassium: 4 mmol/L (ref 3.5–5.1)
Sodium: 139 mmol/L (ref 135–145)
Total Bilirubin: 0.6 mg/dL (ref 0.3–1.2)
Total Protein: 6.4 g/dL — ABNORMAL LOW (ref 6.5–8.1)

## 2021-09-06 LAB — CBC WITH DIFFERENTIAL (CANCER CENTER ONLY)
Abs Immature Granulocytes: 0.01 10*3/uL (ref 0.00–0.07)
Basophils Absolute: 0 10*3/uL (ref 0.0–0.1)
Basophils Relative: 1 %
Eosinophils Absolute: 0.2 10*3/uL (ref 0.0–0.5)
Eosinophils Relative: 4 %
HCT: 34.5 % — ABNORMAL LOW (ref 36.0–46.0)
Hemoglobin: 11 g/dL — ABNORMAL LOW (ref 12.0–15.0)
Immature Granulocytes: 0 %
Lymphocytes Relative: 14 %
Lymphs Abs: 0.7 10*3/uL (ref 0.7–4.0)
MCH: 27.4 pg (ref 26.0–34.0)
MCHC: 31.9 g/dL (ref 30.0–36.0)
MCV: 86 fL (ref 80.0–100.0)
Monocytes Absolute: 0.5 10*3/uL (ref 0.1–1.0)
Monocytes Relative: 9 %
Neutro Abs: 3.9 10*3/uL (ref 1.7–7.7)
Neutrophils Relative %: 72 %
Platelet Count: 233 10*3/uL (ref 150–400)
RBC: 4.01 MIL/uL (ref 3.87–5.11)
RDW: 13.5 % (ref 11.5–15.5)
WBC Count: 5.4 10*3/uL (ref 4.0–10.5)
nRBC: 0 % (ref 0.0–0.2)

## 2021-09-06 NOTE — Progress Notes (Signed)
[place] [phone]   Fax:([phone]  OFFICE PROGRESS NOTE  [name], MD [address] [place] [address]  DIAGNOSIS: Stage IV (T2a, N0, M1a) non-small cell lung cancer, adenocarcinoma with positive EGFR mutation with deletion in exon 19 diagnosed in June 2016 and presented with large mass in the left lower lobe in addition to multiple bilateral pulmonary nodules  PRIOR THERAPY:  1) Gilotrif 40 mg by mouth daily started 05/11/2015, status post 9 months of treatment discontinued on 02/17/2016 secondary to extensive skin rash. 2) SBRT to the enlarging left upper lobe nodule under the care of Dr. Lisbeth Renshaw.  CURRENT THERAPY: Gilotrif 30 mg by mouth daily started 02/21/2016 status post 67 months of treatment.  INTERVAL HISTORY: Elizabeth [name] 75 y.o. female returns to the clinic today for follow-up visit.  The patient is feeling fine today with no concerning complaints.  She underwent surgical resection of the soft tissue in the pelvic area and the final pathology was consistent with follicular lymphoma.  She denied having any current chest pain, shortness of breath, cough or hemoptysis.  She denied having any nausea, vomiting, diarrhea or constipation.  She has no headache or visual changes.  She denied having any recent weight loss or night sweats.  The patient is here today for evaluation and repeat blood work.  MEDICAL HISTORY: Past Medical History:  Diagnosis Date   Adenocarcinoma of left lung, stage 4 ([place]) 04/05/2015   Biopsy confirmed CT SCAN: There are innumerable bilateral pulmonary nodules. These range in size from about 5 mm to 2 cm. They demonstrate irregular indistinct borders, the larger ones demonstrating spiculation. PET SCAN: Diffuse pulmonary metastatic disease with a 4 cm dominant left lower low lung lesion. No enlarged or hypermetabolic mediastinal or hilar adenopathy.   Allergy    Anxiety    Cancer ([place])     Change of skin related to chemotherapy 09/25/2017   Drug-induced skin rash 01/17/2016   History of radiation therapy 12/18/2019   SBRT left lung  12/11/2019-12/18/2019   Dr Gery Pray   History of radiation therapy 06/21/2021   left lung 06/14/2021-06/21/2021 Dr Gery Pray   Hyperlipidemia    Hypertension    Hypothyroidism    Insomnia    Prediabetes    Thyroid disease     ALLERGIES:  is allergic to penicillins.  MEDICATIONS:  Current Outpatient Medications  Medication Sig Dispense Refill   afatinib dimaleate (GILOTRIF) 30 MG tablet TAKE 1 TABLET (30MG) BY MOUTH ONCE DAILY. TAKE ON AN EMPTY STOMACH 1 HOUR BEFORE OR 2 HOURS AFTER A MEAL 30 tablet 2   ALPRAZolam (XANAX) 1 MG tablet Take 1 mg by mouth at bedtime.     aspirin 81 MG tablet Take 81 mg by mouth at bedtime.      hydrochlorothiazide (HYDRODIURIL) 25 MG tablet Take 25 mg by mouth daily.     ibuprofen (ADVIL,MOTRIN) 200 MG tablet Take 400 mg by mouth every 6 (six) hours as needed for headache or moderate pain.     levothyroxine (SYNTHROID) 50 MCG tablet TAKE 1 TABLET BY MOUTH DAILY ON AN EMPTY STOMACH WITH ONLY WATER FOR 30 MINUTES. NO ANTACID, CALCIUM, OR MAGNESIUM FOR 4 HOURS. AVOID BIOTIN (Patient taking differently: Take 50 mcg by mouth daily before breakfast. NO ANTACID, CALCIUM, OR MAGNESIUM FOR 4 HOURS. AVOID BIOTIN) 90 tablet 3   loratadine-pseudoephedrine (CLARITIN-D 12-HOUR) 5-120 MG tablet Take 1 tablet by mouth 2 (two) times daily as  needed for allergies.     melatonin 5 MG TABS Take 5 mg by mouth at bedtime.     montelukast (SINGULAIR) 10 MG tablet TAKE 1 TABLET BY MOUTH DAILY FOR ALLERGIES 90 tablet 3   traMADol (ULTRAM) 50 MG tablet Take 1-2 tablets (50-100 mg total) by mouth every 6 (six) hours as needed for moderate pain or severe pain. 20 tablet 0   No current facility-administered medications for this visit.    SURGICAL HISTORY:  Past Surgical History:  Procedure Laterality Date   ABDOMINAL HYSTERECTOMY   10/23/1993   w BSO   CYST REMOVAL HAND Right    EXCISION MASS ABDOMINAL N/A 08/11/2021   Procedure: EXCISIONAL BIOPSY OF LEFT RETROPERITONEAL MASS, POSSIBLE INTRAOPERATIVE LAPAROSCOPIC ULTRASOUND;  Surgeon: Michael Boston, MD;  Location: [facility];  Service: General;  Laterality: N/A;   LAPAROSCOPY N/A 08/11/2021   Procedure: LAPAROSCOPIC EXPLORATION;  Surgeon: Michael Boston, MD;  Location: [facility];  Service: General;  Laterality: N/A;   TONSILLECTOMY AND ADENOIDECTOMY      REVIEW OF SYSTEMS:  A comprehensive review of systems was negative.   PHYSICAL EXAMINATION: General appearance: alert, cooperative, and no distress Head: Normocephalic, without obvious abnormality, atraumatic Neck: no adenopathy, no JVD, supple, symmetrical, trachea midline, and thyroid not enlarged, symmetric, no tenderness/mass/nodules Lymph nodes: Cervical, supraclavicular, and axillary nodes normal. Resp: clear to auscultation bilaterally Back: symmetric, no curvature. ROM normal. No CVA tenderness. Cardio: regular rate and rhythm, S1, S2 normal, no murmur, click, rub or gallop GI: soft, non-tender; bowel sounds normal; no masses,  no organomegaly Extremities: extremities normal, atraumatic, no cyanosis or edema  ECOG PERFORMANCE STATUS: 1 - Symptomatic but completely ambulatory  Blood pressure 137/73, pulse 80, temperature 98.1 F (36.7 C), temperature source Temporal, resp. rate 17, height 5' (1.524 m), weight 161 lb 11.2 oz (73.3 kg), SpO2 98 %.  LABORATORY DATA: Lab Results  Component Value Date   WBC 7.0 08/23/2021   HGB 10.2 (L) 08/23/2021   HCT 32.4 (L) 08/23/2021   MCV 88.8 08/23/2021   PLT 307 08/23/2021      Chemistry      Component Value Date/Time   NA 141 08/23/2021 1206   NA 139 10/26/2017 1252   K 3.9 08/23/2021 1206   K 4.1 10/26/2017 1252   CL 104 08/23/2021 1206   CO2 28 08/23/2021 1206   CO2 28 10/26/2017 1252   BUN 16 08/23/2021 1206   BUN 15.7 10/26/2017 1252   CREATININE 1.03  (H) 08/23/2021 1206   CREATININE 1.0 10/26/2017 1252      Component Value Date/Time   CALCIUM 8.8 08/23/2021 1206   CALCIUM 8.8 10/26/2017 1252   ALKPHOS 82 07/25/2021 1034   ALKPHOS 70 10/26/2017 1252   AST 25 08/23/2021 1206   AST 36 07/25/2021 1034   AST 19 10/26/2017 1252   ALT 18 08/23/2021 1206   ALT 27 07/25/2021 1034   ALT 22 10/26/2017 1252   BILITOT 0.7 08/23/2021 1206   BILITOT 0.9 07/25/2021 1034   BILITOT 0.90 10/26/2017 1252       RADIOGRAPHIC STUDIES: No results found.   ASSESSMENT AND PLAN:  This is a very pleasant 75 years old white female with stage IV non-small cell lung cancer, adenocarcinoma with positive EGFR mutation with deletion in exon 19 and currently undergoing treatment with Gilotrif initially at a dose of 40 mg for 9 months followed by 30 mg by mouth daily status post 62 months.  She also underwent SBRT to the  enlarging left upper lobe lung nodule. The patient continues to tolerate her treatment with Gilotrif fairly well except for mild intermittent diarrhea and rash in the scalp. She was found on recent imaging studies of the chest, abdomen pelvis to have suspicious nodules in the left lung as well as the retroperitoneum suspicious for disease progression. Her recent PET scan showed hypermetabolic subcutaneous nodule anterior to the left shoulder musculature concerning for cutaneous metastasis in addition to the enlarging nodule in the retroperitoneum anterior to the left iliacus muscle that also hypermetabolic and concerning for metastatic deposit with enlarging hypermetabolic nodule and consolidation peripherally in the left upper lobe concerning for lung cancer recurrence. She had surgical resection of the soft tissue lesion in the retroperitoneum anterior to the left iliacus muscle and this was consistent with follicular lymphoma. I recommended for the patient to continue on her current treatment with Gilotrif with the same dose. I will see her back  for follow-up visit in 2 months for evaluation with repeat blood work as well as CT scan of the chest, abdomen and pelvis for restaging of her disease. The patient was advised to call immediately if she has any other concerning symptoms in the interval. The patient voices understanding of current disease status and treatment options and is in agreement with the current care plan. All questions were answered. The patient knows to call the clinic with any problems, questions or concerns. We can certainly see the patient much sooner if necessary.  Disclaimer: This note was dictated with voice recognition software. Similar sounding words can inadvertently be transcribed and may not be corrected upon review.

## 2021-09-06 NOTE — Progress Notes (Signed)
I spoke with Ms. Elizabeth [name] today.  She is doing well after her recent surgery.  Her treatment will continue as planned.  It was great seeing Ms. Elizabeth [name] today.

## 2021-09-19 ENCOUNTER — Other Ambulatory Visit (HOSPITAL_COMMUNITY): Payer: Self-pay

## 2021-09-20 ENCOUNTER — Telehealth: Payer: Self-pay | Admitting: Internal Medicine

## 2021-09-20 NOTE — Telephone Encounter (Signed)
Sch per 11/15 los, pt aware

## 2021-09-22 ENCOUNTER — Other Ambulatory Visit (HOSPITAL_COMMUNITY): Payer: Self-pay

## 2021-10-18 ENCOUNTER — Other Ambulatory Visit: Payer: Self-pay | Admitting: Internal Medicine

## 2021-10-18 ENCOUNTER — Other Ambulatory Visit (HOSPITAL_COMMUNITY): Payer: Self-pay

## 2021-10-18 MED ORDER — AFATINIB DIMALEATE 30 MG PO TABS
ORAL_TABLET | ORAL | 2 refills | Status: DC
  Filled 2021-10-18: qty 30, 30d supply, fill #0
  Filled 2021-11-16: qty 30, 30d supply, fill #1
  Filled 2021-12-14: qty 30, 30d supply, fill #2

## 2021-10-19 ENCOUNTER — Other Ambulatory Visit (HOSPITAL_COMMUNITY): Payer: Self-pay

## 2021-10-21 ENCOUNTER — Other Ambulatory Visit (HOSPITAL_COMMUNITY): Payer: Self-pay

## 2021-11-04 ENCOUNTER — Ambulatory Visit (HOSPITAL_COMMUNITY)
Admission: RE | Admit: 2021-11-04 | Discharge: 2021-11-04 | Disposition: A | Discharge status: Home / Self Care | Payer: Medicare Other | Source: Ambulatory Visit | Attending: Internal Medicine | Admitting: Internal Medicine

## 2021-11-04 ENCOUNTER — Inpatient Hospital Stay: Payer: Medicare Other | Attending: Internal Medicine

## 2021-11-04 ENCOUNTER — Other Ambulatory Visit: Payer: Self-pay

## 2021-11-04 DIAGNOSIS — Z923 Personal history of irradiation: Secondary | ICD-10-CM | POA: Insufficient documentation

## 2021-11-04 DIAGNOSIS — Z880 Allergy status to penicillin: Secondary | ICD-10-CM | POA: Insufficient documentation

## 2021-11-04 DIAGNOSIS — K449 Diaphragmatic hernia without obstruction or gangrene: Secondary | ICD-10-CM | POA: Insufficient documentation

## 2021-11-04 DIAGNOSIS — E785 Hyperlipidemia, unspecified: Secondary | ICD-10-CM | POA: Insufficient documentation

## 2021-11-04 DIAGNOSIS — E039 Hypothyroidism, unspecified: Secondary | ICD-10-CM | POA: Insufficient documentation

## 2021-11-04 DIAGNOSIS — C3432 Malignant neoplasm of lower lobe, left bronchus or lung: Secondary | ICD-10-CM | POA: Insufficient documentation

## 2021-11-04 DIAGNOSIS — R059 Cough, unspecified: Secondary | ICD-10-CM | POA: Insufficient documentation

## 2021-11-04 DIAGNOSIS — Z79899 Other long term (current) drug therapy: Secondary | ICD-10-CM | POA: Insufficient documentation

## 2021-11-04 DIAGNOSIS — C3490 Malignant neoplasm of unspecified part of unspecified bronchus or lung: Secondary | ICD-10-CM | POA: Diagnosis present

## 2021-11-04 DIAGNOSIS — M79602 Pain in left arm: Secondary | ICD-10-CM | POA: Insufficient documentation

## 2021-11-04 DIAGNOSIS — R0602 Shortness of breath: Secondary | ICD-10-CM | POA: Insufficient documentation

## 2021-11-04 DIAGNOSIS — I700 Atherosclerosis of aorta: Secondary | ICD-10-CM | POA: Insufficient documentation

## 2021-11-04 DIAGNOSIS — I10 Essential (primary) hypertension: Secondary | ICD-10-CM | POA: Insufficient documentation

## 2021-11-04 DIAGNOSIS — F419 Anxiety disorder, unspecified: Secondary | ICD-10-CM | POA: Insufficient documentation

## 2021-11-04 DIAGNOSIS — M25512 Pain in left shoulder: Secondary | ICD-10-CM | POA: Insufficient documentation

## 2021-11-04 DIAGNOSIS — K5730 Diverticulosis of large intestine without perforation or abscess without bleeding: Secondary | ICD-10-CM | POA: Insufficient documentation

## 2021-11-04 DIAGNOSIS — Z90722 Acquired absence of ovaries, bilateral: Secondary | ICD-10-CM | POA: Insufficient documentation

## 2021-11-04 LAB — CMP (CANCER CENTER ONLY)
ALT: 13 U/L (ref 0–44)
AST: 28 U/L (ref 15–41)
Albumin: 3.8 g/dL (ref 3.5–5.0)
Alkaline Phosphatase: 69 U/L (ref 38–126)
Anion gap: 6 (ref 5–15)
BUN: 13 mg/dL (ref 8–23)
CO2: 29 mmol/L (ref 22–32)
Calcium: 9 mg/dL (ref 8.9–10.3)
Chloride: 104 mmol/L (ref 98–111)
Creatinine: 0.91 mg/dL (ref 0.44–1.00)
GFR, Estimated: >60 mL/min (ref >60)
Glucose, Bld: 87 mg/dL (ref 70–99)
Potassium: 4 mmol/L (ref 3.5–5.1)
Sodium: 139 mmol/L (ref 135–145)
Total Bilirubin: 0.6 mg/dL (ref 0.3–1.2)
Total Protein: 7 g/dL (ref 6.5–8.1)

## 2021-11-04 LAB — CBC WITH DIFFERENTIAL (CANCER CENTER ONLY)
Abs Immature Granulocytes: 0.01 10*3/uL (ref 0.00–0.07)
Basophils Absolute: 0.1 10*3/uL (ref 0.0–0.1)
Basophils Relative: 1 %
Eosinophils Absolute: 0.1 10*3/uL (ref 0.0–0.5)
Eosinophils Relative: 3 %
HCT: 37.6 % (ref 36.0–46.0)
Hemoglobin: 12.1 g/dL (ref 12.0–15.0)
Immature Granulocytes: 0 %
Lymphocytes Relative: 18 %
Lymphs Abs: 1 10*3/uL (ref 0.7–4.0)
MCH: 26.4 pg (ref 26.0–34.0)
MCHC: 32.2 g/dL (ref 30.0–36.0)
MCV: 82.1 fL (ref 80.0–100.0)
Monocytes Absolute: 0.6 10*3/uL (ref 0.1–1.0)
Monocytes Relative: 10 %
Neutro Abs: 3.8 10*3/uL (ref 1.7–7.7)
Neutrophils Relative %: 68 %
Platelet Count: 198 10*3/uL (ref 150–400)
RBC: 4.58 MIL/uL (ref 3.87–5.11)
RDW: 13.7 % (ref 11.5–15.5)
WBC Count: 5.6 10*3/uL (ref 4.0–10.5)
nRBC: 0 % (ref 0.0–0.2)

## 2021-11-04 MED ORDER — IOHEXOL 350 MG/ML SOLN: 80.0000 mL | Freq: Once | INTRAVENOUS | Status: DC | PRN

## 2021-11-04 MED ORDER — SODIUM CHLORIDE (PF) 0.9 % IJ SOLN
INTRAMUSCULAR | Status: AC
  Filled 2021-11-04: qty 50

## 2021-11-04 MED ORDER — IOHEXOL 300 MG/ML  SOLN
100.0000 mL | Freq: Once | INTRAMUSCULAR | Status: AC | PRN
Start: 2021-11-04 — End: 2021-11-04
  Administered 2021-11-04: 80 mL via INTRAVENOUS

## 2021-11-08 ENCOUNTER — Other Ambulatory Visit: Payer: Self-pay

## 2021-11-08 ENCOUNTER — Inpatient Hospital Stay (HOSPITAL_BASED_OUTPATIENT_CLINIC_OR_DEPARTMENT_OTHER): Payer: Medicare Other | Admitting: Internal Medicine

## 2021-11-08 ENCOUNTER — Encounter: Payer: Self-pay | Admitting: Internal Medicine

## 2021-11-08 VITALS — BP 137/89 | HR 84 | Temp 97.2°F | Resp 18 | Ht 60.0 in | Wt 159.4 lb

## 2021-11-08 DIAGNOSIS — Z923 Personal history of irradiation: Secondary | ICD-10-CM | POA: Diagnosis not present

## 2021-11-08 DIAGNOSIS — F419 Anxiety disorder, unspecified: Secondary | ICD-10-CM | POA: Diagnosis not present

## 2021-11-08 DIAGNOSIS — I700 Atherosclerosis of aorta: Secondary | ICD-10-CM | POA: Diagnosis not present

## 2021-11-08 DIAGNOSIS — Z79899 Other long term (current) drug therapy: Secondary | ICD-10-CM | POA: Diagnosis not present

## 2021-11-08 DIAGNOSIS — K5730 Diverticulosis of large intestine without perforation or abscess without bleeding: Secondary | ICD-10-CM | POA: Diagnosis not present

## 2021-11-08 DIAGNOSIS — C3492 Malignant neoplasm of unspecified part of left bronchus or lung: Secondary | ICD-10-CM

## 2021-11-08 DIAGNOSIS — M79602 Pain in left arm: Secondary | ICD-10-CM | POA: Diagnosis not present

## 2021-11-08 DIAGNOSIS — K449 Diaphragmatic hernia without obstruction or gangrene: Secondary | ICD-10-CM | POA: Diagnosis not present

## 2021-11-08 DIAGNOSIS — Z90722 Acquired absence of ovaries, bilateral: Secondary | ICD-10-CM | POA: Diagnosis not present

## 2021-11-08 DIAGNOSIS — M25512 Pain in left shoulder: Secondary | ICD-10-CM | POA: Diagnosis not present

## 2021-11-08 DIAGNOSIS — E039 Hypothyroidism, unspecified: Secondary | ICD-10-CM | POA: Diagnosis not present

## 2021-11-08 DIAGNOSIS — E785 Hyperlipidemia, unspecified: Secondary | ICD-10-CM | POA: Diagnosis not present

## 2021-11-08 DIAGNOSIS — Z5111 Encounter for antineoplastic chemotherapy: Secondary | ICD-10-CM

## 2021-11-08 DIAGNOSIS — C3432 Malignant neoplasm of lower lobe, left bronchus or lung: Secondary | ICD-10-CM | POA: Diagnosis not present

## 2021-11-08 DIAGNOSIS — I10 Essential (primary) hypertension: Secondary | ICD-10-CM | POA: Diagnosis not present

## 2021-11-08 DIAGNOSIS — R0602 Shortness of breath: Secondary | ICD-10-CM | POA: Diagnosis not present

## 2021-11-08 DIAGNOSIS — Z880 Allergy status to penicillin: Secondary | ICD-10-CM | POA: Diagnosis not present

## 2021-11-08 DIAGNOSIS — R059 Cough, unspecified: Secondary | ICD-10-CM | POA: Diagnosis not present

## 2021-11-08 NOTE — Progress Notes (Signed)
[facility] [phone]   [phone]  OFFICE PROGRESS NOTE  [name], MD [address] [place] [address]  DIAGNOSIS: Stage IV (T2a, N0, M1a) non-small cell lung cancer, adenocarcinoma with positive EGFR mutation with deletion in exon 19 diagnosed in June 2016 and presented with large mass in the left lower lobe in addition to multiple bilateral pulmonary nodules  PRIOR THERAPY:  1) Gilotrif 40 mg by mouth daily started 05/11/2015, status post 9 months of treatment discontinued on 02/17/2016 secondary to extensive skin rash. 2) SBRT to the enlarging left upper lobe nodule under the care of Dr. Lisbeth Renshaw.  CURRENT THERAPY: Gilotrif 30 mg by mouth daily started 02/21/2016 status post 69 months of treatment.  INTERVAL HISTORY: Elizabeth [name] 76 y.o. female returns to the clinic today for follow-up visit.  The patient is feeling fine today with no concerning complaints except for mild aching pain in the left shoulder and arm.  She denied having any current chest pain but has shortness of breath with exertion with mild cough and no hemoptysis.  She has no nausea, vomiting, diarrhea or constipation.  She denied having any skin rash.  She has no significant weight loss or night sweats.  She continues to tolerate her treatment with Gilotrif fairly well.  She is here today for evaluation and repeat CT scan of the chest, abdomen and pelvis for restaging of her disease.   MEDICAL HISTORY: Past Medical History:  Diagnosis Date   Adenocarcinoma of left lung, stage 4 ([place]) 04/05/2015   Biopsy confirmed CT SCAN: There are innumerable bilateral pulmonary nodules. These range in size from about 5 mm to 2 cm. They demonstrate irregular indistinct borders, the larger ones demonstrating spiculation. PET SCAN: Diffuse pulmonary metastatic disease with a 4 cm dominant left lower low lung lesion. No enlarged or hypermetabolic mediastinal or hilar  adenopathy.   Allergy    Anxiety    Cancer ([place])    Change of skin related to chemotherapy 09/25/2017   Drug-induced skin rash 01/17/2016   History of radiation therapy 12/18/2019   SBRT left lung  12/11/2019-12/18/2019   Dr Gery Pray   History of radiation therapy 06/21/2021   left lung 06/14/2021-06/21/2021 Dr Gery Pray   Hyperlipidemia    Hypertension    Hypothyroidism    Insomnia    Prediabetes    Thyroid disease     ALLERGIES:  is allergic to penicillins.  MEDICATIONS:  Current Outpatient Medications  Medication Sig Dispense Refill   afatinib dimaleate (GILOTRIF) 30 MG tablet TAKE 1 TABLET (30MG) BY MOUTH ONCE DAILY. TAKE ON AN EMPTY STOMACH 1 HOUR BEFORE OR 2 HOURS AFTER A MEAL 30 tablet 2   ALPRAZolam (XANAX) 1 MG tablet Take 1 mg by mouth at bedtime.     aspirin 81 MG tablet Take 81 mg by mouth at bedtime.      ezetimibe (ZETIA) 10 MG tablet Take 10 mg by mouth daily.     hydrochlorothiazide (HYDRODIURIL) 25 MG tablet Take 25 mg by mouth daily.     ibuprofen (ADVIL,MOTRIN) 200 MG tablet Take 400 mg by mouth every 6 (six) hours as needed for headache or moderate pain.     levothyroxine (SYNTHROID) 50 MCG tablet TAKE 1 TABLET BY MOUTH DAILY ON AN EMPTY STOMACH WITH ONLY WATER FOR 30 MINUTES. NO ANTACID, CALCIUM, OR MAGNESIUM FOR 4 HOURS. AVOID BIOTIN (Patient taking differently: Take 50 mcg by mouth daily before breakfast.  NO ANTACID, CALCIUM, OR MAGNESIUM FOR 4 HOURS. AVOID BIOTIN) 90 tablet 3   loratadine-pseudoephedrine (CLARITIN-D 12-HOUR) 5-120 MG tablet Take 1 tablet by mouth 2 (two) times daily as needed for allergies.     melatonin 5 MG TABS Take 5 mg by mouth at bedtime.     montelukast (SINGULAIR) 10 MG tablet TAKE 1 TABLET BY MOUTH DAILY FOR ALLERGIES 90 tablet 3   No current facility-administered medications for this visit.    SURGICAL HISTORY:  Past Surgical History:  Procedure Laterality Date   ABDOMINAL HYSTERECTOMY  10/23/1993   w BSO   CYST REMOVAL  HAND Right    EXCISION MASS ABDOMINAL N/A 08/11/2021   Procedure: EXCISIONAL BIOPSY OF LEFT RETROPERITONEAL MASS, POSSIBLE INTRAOPERATIVE LAPAROSCOPIC ULTRASOUND;  Surgeon: Michael Boston, MD;  Location: [facility];  Service: General;  Laterality: N/A;   LAPAROSCOPY N/A 08/11/2021   Procedure: LAPAROSCOPIC EXPLORATION;  Surgeon: Michael Boston, MD;  Location: [facility];  Service: General;  Laterality: N/A;   TONSILLECTOMY AND ADENOIDECTOMY      REVIEW OF SYSTEMS:  Constitutional: positive for fatigue Eyes: negative Ears, nose, mouth, throat, and face: negative Respiratory: positive for dyspnea on exertion Cardiovascular: negative Gastrointestinal: negative Genitourinary:negative Integument/breast: negative Hematologic/lymphatic: negative Musculoskeletal:positive for arthralgias Neurological: negative Behavioral/Psych: negative Endocrine: negative Allergic/Immunologic: negative   PHYSICAL EXAMINATION: General appearance: alert, cooperative, fatigued, and no distress Head: Normocephalic, without obvious abnormality, atraumatic Neck: no adenopathy, no JVD, supple, symmetrical, trachea midline, and thyroid not enlarged, symmetric, no tenderness/mass/nodules Lymph nodes: Cervical, supraclavicular, and axillary nodes normal. Resp: clear to auscultation bilaterally Back: symmetric, no curvature. ROM normal. No CVA tenderness. Cardio: regular rate and rhythm, S1, S2 normal, no murmur, click, rub or gallop GI: soft, non-tender; bowel sounds normal; no masses,  no organomegaly Extremities: extremities normal, atraumatic, no cyanosis or edema Neurologic: Alert and oriented X 3, normal strength and tone. Normal symmetric reflexes. Normal coordination and gait  ECOG PERFORMANCE STATUS: 1 - Symptomatic but completely ambulatory  Blood pressure 137/89, pulse 84, temperature (!) 97.2 F (36.2 C), temperature source Tympanic, resp. rate 18, height 5' (1.524 m), weight 159 lb 6.4 oz (72.3 kg), SpO2 96  %.  LABORATORY DATA: Lab Results  Component Value Date   WBC 5.6 11/04/2021   HGB 12.1 11/04/2021   HCT 37.6 11/04/2021   MCV 82.1 11/04/2021   PLT 198 11/04/2021      Chemistry      Component Value Date/Time   NA 139 11/04/2021 0952   NA 139 10/26/2017 1252   K 4.0 11/04/2021 0952   K 4.1 10/26/2017 1252   CL 104 11/04/2021 0952   CO2 29 11/04/2021 0952   CO2 28 10/26/2017 1252   BUN 13 11/04/2021 0952   BUN 15.7 10/26/2017 1252   CREATININE 0.91 11/04/2021 0952   CREATININE 1.03 (H) 08/23/2021 1206   CREATININE 1.0 10/26/2017 1252      Component Value Date/Time   CALCIUM 9.0 11/04/2021 0952   CALCIUM 8.8 10/26/2017 1252   ALKPHOS 69 11/04/2021 0952   ALKPHOS 70 10/26/2017 1252   AST 28 11/04/2021 0952   AST 19 10/26/2017 1252   ALT 13 11/04/2021 0952   ALT 22 10/26/2017 1252   BILITOT 0.6 11/04/2021 0952   BILITOT 0.90 10/26/2017 1252       RADIOGRAPHIC STUDIES: CT Chest W Contrast  Result Date: 11/05/2021 CLINICAL DATA:  76 year old female with history of non-small cell lung cancer. Restaging examination. EXAM: CT CHEST, ABDOMEN, AND PELVIS WITH CONTRAST TECHNIQUE: Multidetector  CT imaging of the chest, abdomen and pelvis was performed following the standard protocol during bolus administration of intravenous contrast. RADIATION DOSE REDUCTION: This exam was performed according to the departmental dose-optimization program which includes automated exposure control, adjustment of the mA and/or kV according to patient size and/or use of iterative reconstruction technique. CONTRAST:  58m OMNIPAQUE IOHEXOL 300 MG/ML  SOLN COMPARISON:  CT of the chest, abdomen and pelvis 04/04/2021. Chest CTA 07/06/2021. FINDINGS: CT CHEST FINDINGS Cardiovascular: Heart size is normal. There is no significant pericardial fluid, thickening or pericardial calcification. Atherosclerotic calcifications in the thoracic aorta. No definite coronary artery calcifications. Mediastinum/Nodes: No  pathologically enlarged mediastinal or hilar lymph nodes. Small hiatal hernia. No axillary lymphadenopathy. Lungs/Pleura: Chronic mass-like architectural distortion in the central aspect of the lower left lung, stable compared to the prior examination, compatible with chronic postradiation mass-like fibrosis. When compared to the prior study there is increasing mass-like architectural distortion in the left upper lobe at the site of the previously noted hypermetabolic nodule (best appreciated on axial image 41 of series 4), presumably reflective of evolving postradiation pneumonitis and developing postradiation fibrosis. Between these 2 regions in the posterior aspect of the left upper lobe (axial image 54 of series 4) there is an ovoid shaped nodular area of architectural distortion measuring 2.4 x 0.8 cm, also likely post treatment related. Multiple ill-defined nodular areas of architectural distortion are noted elsewhere in the lungs, largest of which is in the right lower lobe (axial image 69 of series 4) measuring 1.3 x 0.8 cm, with increasing conspicuity of many of these lesions when compared to prior examinations. No confluent consolidative airspace disease. No pleural effusions. Musculoskeletal: There are no aggressive appearing lytic or blastic lesions noted in the visualized portions of the skeleton. CT ABDOMEN PELVIS FINDINGS Hepatobiliary: In segment 7 of the liver there is again a large 3.7 x 2.8 cm centrally low-attenuation lesion with peripheral nodular hyperenhancement and some progressive centripetal filling on delayed images, diagnostic of a cavernous hemangioma. No other new suspicious appearing hepatic lesions. No intra or extrahepatic biliary ductal dilatation. Gallbladder is normal in appearance. Pancreas: No pancreatic mass. No pancreatic ductal dilatation. No pancreatic or peripancreatic fluid collections or inflammatory changes. Spleen: Calcified granuloma in the inferior aspect of the  spleen. Adrenals/Urinary Tract: Bilateral kidneys and adrenal glands are normal in appearance. No hydroureteronephrosis. Urinary bladder is normal in appearance. Stomach/Bowel: Normal appearance of the stomach. No pathologic dilatation of small bowel or colon. A few scattered colonic diverticulae are noted, without surrounding inflammatory changes to suggest an acute diverticulitis at this time. Normal appendix. Vascular/Lymphatic: Aortic atherosclerosis, without evidence of aneurysm or dissection in the abdominal or pelvic vasculature. No lymphadenopathy noted in the abdomen or pelvis. Reproductive: Status post hysterectomy. Ovaries are not confidently identified may be surgically absent or atrophic. Other: No significant volume of ascites.  No pneumoperitoneum. Musculoskeletal: 4 mm of anterolisthesis of L4 upon L5, similar to the prior study. There are no aggressive appearing lytic or blastic lesions noted in the visualized portions of the skeleton. IMPRESSION: 1. Evolving postradiation changes in the left lung, as above. Today's study will serve as a new baseline for future follow-up examinations. In particular, attention to the area of nodularity in the posterior aspect of the left upper lobe (axial image 54 of series 4), which appears conspicuous in its location between the 2 areas of presumed postradiation mass-like fibrosis. Attention on follow-up to ensure stability is recommended. 2. Multiple ill-defined areas of nodularity located throughout  the lungs bilaterally, concerning for potential multifocal bronchogenic adenocarcinoma. 3. No signs of metastatic disease noted in the abdomen or pelvis. 4. Colonic diverticulosis without evidence of acute diverticulitis at this time. 5. Additional incidental findings, as above. Electronically Signed   By: Vinnie Langton M.D.   On: 11/05/2021 08:41   CT Abdomen Pelvis W Contrast  Result Date: 11/05/2021 CLINICAL DATA:  76 year old female with history of  non-small cell lung cancer. Restaging examination. EXAM: CT CHEST, ABDOMEN, AND PELVIS WITH CONTRAST TECHNIQUE: Multidetector CT imaging of the chest, abdomen and pelvis was performed following the standard protocol during bolus administration of intravenous contrast. RADIATION DOSE REDUCTION: This exam was performed according to the departmental dose-optimization program which includes automated exposure control, adjustment of the mA and/or kV according to patient size and/or use of iterative reconstruction technique. CONTRAST:  13m OMNIPAQUE IOHEXOL 300 MG/ML  SOLN COMPARISON:  CT of the chest, abdomen and pelvis 04/04/2021. Chest CTA 07/06/2021. FINDINGS: CT CHEST FINDINGS Cardiovascular: Heart size is normal. There is no significant pericardial fluid, thickening or pericardial calcification. Atherosclerotic calcifications in the thoracic aorta. No definite coronary artery calcifications. Mediastinum/Nodes: No pathologically enlarged mediastinal or hilar lymph nodes. Small hiatal hernia. No axillary lymphadenopathy. Lungs/Pleura: Chronic mass-like architectural distortion in the central aspect of the lower left lung, stable compared to the prior examination, compatible with chronic postradiation mass-like fibrosis. When compared to the prior study there is increasing mass-like architectural distortion in the left upper lobe at the site of the previously noted hypermetabolic nodule (best appreciated on axial image 41 of series 4), presumably reflective of evolving postradiation pneumonitis and developing postradiation fibrosis. Between these 2 regions in the posterior aspect of the left upper lobe (axial image 54 of series 4) there is an ovoid shaped nodular area of architectural distortion measuring 2.4 x 0.8 cm, also likely post treatment related. Multiple ill-defined nodular areas of architectural distortion are noted elsewhere in the lungs, largest of which is in the right lower lobe (axial image 69 of series  4) measuring 1.3 x 0.8 cm, with increasing conspicuity of many of these lesions when compared to prior examinations. No confluent consolidative airspace disease. No pleural effusions. Musculoskeletal: There are no aggressive appearing lytic or blastic lesions noted in the visualized portions of the skeleton. CT ABDOMEN PELVIS FINDINGS Hepatobiliary: In segment 7 of the liver there is again a large 3.7 x 2.8 cm centrally low-attenuation lesion with peripheral nodular hyperenhancement and some progressive centripetal filling on delayed images, diagnostic of a cavernous hemangioma. No other new suspicious appearing hepatic lesions. No intra or extrahepatic biliary ductal dilatation. Gallbladder is normal in appearance. Pancreas: No pancreatic mass. No pancreatic ductal dilatation. No pancreatic or peripancreatic fluid collections or inflammatory changes. Spleen: Calcified granuloma in the inferior aspect of the spleen. Adrenals/Urinary Tract: Bilateral kidneys and adrenal glands are normal in appearance. No hydroureteronephrosis. Urinary bladder is normal in appearance. Stomach/Bowel: Normal appearance of the stomach. No pathologic dilatation of small bowel or colon. A few scattered colonic diverticulae are noted, without surrounding inflammatory changes to suggest an acute diverticulitis at this time. Normal appendix. Vascular/Lymphatic: Aortic atherosclerosis, without evidence of aneurysm or dissection in the abdominal or pelvic vasculature. No lymphadenopathy noted in the abdomen or pelvis. Reproductive: Status post hysterectomy. Ovaries are not confidently identified may be surgically absent or atrophic. Other: No significant volume of ascites.  No pneumoperitoneum. Musculoskeletal: 4 mm of anterolisthesis of L4 upon L5, similar to the prior study. There are no aggressive appearing lytic or  blastic lesions noted in the visualized portions of the skeleton. IMPRESSION: 1. Evolving postradiation changes in the left  lung, as above. Today's study will serve as a new baseline for future follow-up examinations. In particular, attention to the area of nodularity in the posterior aspect of the left upper lobe (axial image 54 of series 4), which appears conspicuous in its location between the 2 areas of presumed postradiation mass-like fibrosis. Attention on follow-up to ensure stability is recommended. 2. Multiple ill-defined areas of nodularity located throughout the lungs bilaterally, concerning for potential multifocal bronchogenic adenocarcinoma. 3. No signs of metastatic disease noted in the abdomen or pelvis. 4. Colonic diverticulosis without evidence of acute diverticulitis at this time. 5. Additional incidental findings, as above. Electronically Signed   By: Vinnie Langton M.D.   On: 11/05/2021 08:41     ASSESSMENT AND PLAN:  This is a very pleasant 76 years old white female with stage IV non-small cell lung cancer, adenocarcinoma with positive EGFR mutation with deletion in exon 19 and currently undergoing treatment with Gilotrif initially at a dose of 40 mg for 9 months followed by 30 mg by mouth daily status post 67 months.  She also underwent SBRT to the enlarging left upper lobe lung nodule. The patient continues to tolerate her treatment with Gilotrif fairly well except for mild intermittent diarrhea and rash in the scalp. She was found on recent imaging studies of the chest, abdomen pelvis to have suspicious nodules in the left lung as well as the retroperitoneum suspicious for disease progression. Her recent PET scan showed hypermetabolic subcutaneous nodule anterior to the left shoulder musculature concerning for cutaneous metastasis in addition to the enlarging nodule in the retroperitoneum anterior to the left iliacus muscle that also hypermetabolic and concerning for metastatic deposit with enlarging hypermetabolic nodule and consolidation peripherally in the left upper lobe concerning for lung cancer  recurrence. She had surgical resection of the soft tissue lesion in the retroperitoneum anterior to the left iliacus muscle and this was consistent with follicular lymphoma. The patient continues to tolerate her treatment with Gilotrif fairly well. She had repeat CT scan of the chest, abdomen pelvis performed recently.  I personally and independently reviewed the scan images and discussed the results with the patient today. Her scan showed no concerning findings for disease progression and the patient continues to have the multiple ill-defined areas of nodularity as well as the postradiation and scarring. I recommended for her to continue her current treatment with Gilotrif with the same dose. I will see her back for follow-up visit in 2 months for evaluation and repeat blood work. She was advised to call immediately if she has any other concerning symptoms in the interval. The patient voices understanding of current disease status and treatment options and is in agreement with the current care plan. All questions were answered. The patient knows to call the clinic with any problems, questions or concerns. We can certainly see the patient much sooner if necessary.  Disclaimer: This note was dictated with voice recognition software. Similar sounding words can inadvertently be transcribed and may not be corrected upon review.

## 2021-11-11 LAB — HM MAMMOGRAPHY

## 2021-11-16 ENCOUNTER — Encounter: Payer: Self-pay | Admitting: Internal Medicine

## 2021-11-16 ENCOUNTER — Other Ambulatory Visit (HOSPITAL_COMMUNITY): Payer: Self-pay

## 2021-11-22 ENCOUNTER — Other Ambulatory Visit (HOSPITAL_COMMUNITY): Payer: Self-pay

## 2021-11-29 ENCOUNTER — Ambulatory Visit (INDEPENDENT_AMBULATORY_CARE_PROVIDER_SITE_OTHER): Payer: Medicare Other | Admitting: Internal Medicine

## 2021-11-29 ENCOUNTER — Other Ambulatory Visit: Payer: Self-pay

## 2021-11-29 ENCOUNTER — Encounter: Payer: Self-pay | Admitting: Internal Medicine

## 2021-11-29 VITALS — BP 122/82 | HR 114 | Temp 97.3°F | Ht 60.0 in | Wt 163.0 lb

## 2021-11-29 DIAGNOSIS — Z1211 Encounter for screening for malignant neoplasm of colon: Secondary | ICD-10-CM

## 2021-11-29 DIAGNOSIS — Z0001 Encounter for general adult medical examination with abnormal findings: Secondary | ICD-10-CM

## 2021-11-29 DIAGNOSIS — Z136 Encounter for screening for cardiovascular disorders: Secondary | ICD-10-CM | POA: Diagnosis not present

## 2021-11-29 DIAGNOSIS — E782 Mixed hyperlipidemia: Secondary | ICD-10-CM

## 2021-11-29 DIAGNOSIS — I10 Essential (primary) hypertension: Secondary | ICD-10-CM

## 2021-11-29 DIAGNOSIS — Z8249 Family history of ischemic heart disease and other diseases of the circulatory system: Secondary | ICD-10-CM | POA: Diagnosis not present

## 2021-11-29 DIAGNOSIS — Z0000 Encounter for general adult medical examination without abnormal findings: Secondary | ICD-10-CM

## 2021-11-29 DIAGNOSIS — J0100 Acute maxillary sinusitis, unspecified: Secondary | ICD-10-CM

## 2021-11-29 DIAGNOSIS — Z87891 Personal history of nicotine dependence: Secondary | ICD-10-CM

## 2021-11-29 DIAGNOSIS — R7309 Other abnormal glucose: Secondary | ICD-10-CM

## 2021-11-29 DIAGNOSIS — E039 Hypothyroidism, unspecified: Secondary | ICD-10-CM

## 2021-11-29 DIAGNOSIS — Z79899 Other long term (current) drug therapy: Secondary | ICD-10-CM

## 2021-11-29 DIAGNOSIS — E559 Vitamin D deficiency, unspecified: Secondary | ICD-10-CM

## 2021-11-29 DIAGNOSIS — I700 Atherosclerosis of aorta: Secondary | ICD-10-CM

## 2021-11-29 MED ORDER — AZITHROMYCIN 250 MG PO TABS: ORAL_TABLET | ORAL | 1 refills | Status: DC

## 2021-11-29 MED ORDER — DEXAMETHASONE 4 MG PO TABS: ORAL_TABLET | ORAL | 0 refills | Status: DC

## 2021-11-29 NOTE — Patient Instructions (Signed)

## 2021-11-29 NOTE — Progress Notes (Signed)
Annual Screening/Preventative Visit & Comprehensive Evaluation &  Examination  Future Appointments  Date Time Provider Department  11/29/2021  2:00 PM [name], MD [facility]  01/11/2022 10:45 AM [facility]  01/11/2022 11:15 AM Curt Bears, MD [facility]  05/18/2022 11:00 AM Liane Comber, NP [facility]  12/05/2022  2:00 PM [name], MD [facility]        This very nice 76 y.o. MWF presents for a Screening /Preventative Visit & comprehensive evaluation and management of multiple medical co-morbidities.  Patient has been followed for HTN, HLD, Prediabetes  and Vitamin D Deficiency. Abd CT scan in 2021 revealed Aortic Atherosclerosis .                                                       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 She also has been dx'd with an indolent follicular Lymphoma.         HTN predates since  1997. Patient's BP has been controlled at home and patient denies any cardiac symptoms as chest pain, palpitations, shortness of breath, dizziness or ankle swelling. Today's BP was initially elevated & rechecked at goal - 122/82        Patient's hyperlipidemia is controlled with diet and medications. Patient denies myalgias or other medication SE's. Last lipids were notat goal :  Lab Results  Component Value Date   CHOL 172 08/23/2021   HDL 43 (L) 08/23/2021   LDLCALC 104 (H) 08/23/2021   TRIG 148 08/23/2021   CHOLHDL 4.0 08/23/2021         Patient has hx/o prediabetes (A1c 5.7% /2012) and patient denies reactive hypoglycemic symptoms, visual blurring, diabetic polys or paresthesias. Last A1c was near goal :  Lab Results  Component Value Date   HGBA1C 5.7 (H) 07/21/2021                                            In 1996, patient was dx'd Hypothyroid & has been on thyroid replacement since .        Finally, patient has history of Vitamin D Deficiency and  last Vitamin D was at goal :  Lab Results  Component Value Date   VD25OH 74 11/18/2020     Current Outpatient Medications on File Prior to Visit  Medication Sig   afatinib dimaleate (GILOTRIF) 30 MG tablet TAKE 1 TABLET ONCE DAILY. TAKE ON AN EMPTY STOMACH 1 HOUR BEFORE OR 2 HOURS AFTER A MEAL   ALPRAZolam 1 MG tablet Take  at bedtime. Gets from a Dr Neta Mends ! )    aspirin 81 MG tablet Take  at bedtime.    ezetimibe 10 MG tablet Take daily.   hydrochlorothiazide 25 MG tablet Take  daily.   ibuprofen  200 MG tablet Take 400 mg every 6  hours as needed    levothyroxine 50 MCG tablet TAKE 1 TABLET DAILY    CLARITIN-D 12-HR    5-120 MG tablet Take 1 tablet 2  times daily as needed   melatonin 5 MG TABS Take at bedtime.   montelukast 10 MG tablet TAKE 1 TABLET DAILY  Allergies  Allergen Reactions   Penicillins Swelling and Rash    Past Medical History:  Diagnosis Date   Adenocarcinoma of left lung, stage 4 ([place]) 04/05/2015   Biopsy confirmed CT SCAN: There are innumerable bilateral pulmonary nodules. These range in size from about 5 mm to 2 cm. They demonstrate irregular indistinct borders, the larger ones demonstrating spiculation. PET SCAN: Diffuse pulmonary metastatic disease with a 4 cm dominant left lower low lung lesion. No enlarged or hypermetabolic mediastinal or hilar adenopathy.   Allergy    Anxiety    Cancer ([place])    Change of skin related to chemotherapy 09/25/2017   Drug-induced skin rash 01/17/2016   History of radiation therapy 12/18/2019   SBRT left lung  12/11/2019-12/18/2019   Dr Gery Pray   History of radiation therapy 06/21/2021   left lung 06/14/2021-06/21/2021 Dr Gery Pray   Hyperlipidemia    Hypertension    Hypothyroidism    Insomnia    Prediabetes    Thyroid disease      Health Maintenance  Topic Date Due   Zoster Vaccines- Shingrix (1 of 2) Never done   TETANUS/TDAP  12/26/2015   DEXA SCAN  10/31/2020   INFLUENZA VACCINE  05/23/2021    COVID-19 Vaccine (5 - Booster for Moderna series) 07/11/2021   COLONOSCOPY  11/19/2022   MAMMOGRAM  11/12/2023   Pneumonia Vaccine 27+ Years old  Completed   HPV VACCINES  Aged Out   Hepatitis C Screening  Discontinued     Immunization History  Administered Date(s) Administered   Influenza Split 07/16/2018   Influenza, High Dose 08/06/2017, 07/09/2019, 07/09/2019, 07/07/2020   Influenza-Unspecified 09/13/2015, 07/23/2016, 08/06/2017, 07/23/2018   Moderna Sars-Covid-2 Vacc 12/05/2019, 01/03/2020, 07/26/2020, 05/16/2021   Pneumococcal -13 12/24/2015   Pneumococcal -23 07/22/2012   Pneumococcal -23 07/22/2012   Td 12/25/2005   Zoster, Live 01/09/2007    Last Colon - 11/19/2012 - Dr Deborha Payment - Recc 10 yr f/u - due Feb 2024    Last MGM - 11/11/2021-    Last dexaBMD - 10/31/2018 - Osteopenia   Past Surgical History:  Procedure Laterality Date   ABDOMINAL HYSTERECTOMY  10/23/1993   w BSO   CYST REMOVAL HAND Right    EXCISION MASS ABDOMINAL N/A 08/11/2021   Procedure: EXCISIONAL BIOPSY OF LEFT RETROPERITONEAL MASS, POSSIBLE INTRAOPERATIVE LAPAROSCOPIC ULTRASOUND;  Surgeon: Michael Boston, MD;  Location: [facility];  Service: General;  Laterality: N/A;   LAPAROSCOPY N/A 08/11/2021   Procedure: LAPAROSCOPIC EXPLORATION;  Surgeon: Michael Boston, MD;  Location: [facility];  Service: General;  Laterality: N/A;   TONSILLECTOMY AND ADENOIDECTOMY       Family History  Problem Relation Age of Onset   Liver disease Mother    Alcohol abuse Mother    ALS Father    Hypertension Father    Dementia Maternal Grandmother 94   Lung disease Maternal Grandfather    Lung cancer Paternal Grandmother 14   Alcohol abuse Paternal Grandfather    Heart disease Paternal Grandfather    Melanoma Paternal Aunt    Bone cancer Maternal Uncle    Dementia Paternal Aunt 90   Breast cancer Cousin      Social History   Tobacco Use   Smoking status: Former    Packs/day: 0.50    Years: 10.00    Pack  years: 5.00    Types: Cigarettes    Quit date: 10/23/1974    Years since quitting: 47.1   Smokeless tobacco: Never  Vaping Use  Vaping Use: Never used  Substance Use Topics   Alcohol use: No   Drug use: No      ROS Constitutional: Denies fever, chills, weight loss/gain, headaches, insomnia,  night sweats, and change in appetite. Does c/o fatigue. Eyes: Denies redness, blurred vision, diplopia, discharge, itchy, watery eyes.  ENT: Denies discharge, congestion, post nasal drip, epistaxis, sore throat, earache, hearing loss, dental pain, Tinnitus, Vertigo, Sinus pain, snoring.  Cardio: Denies chest pain, palpitations, irregular heartbeat, syncope, dyspnea, diaphoresis, orthopnea, PND, claudication, edema Respiratory: denies cough, dyspnea, DOE, pleurisy, hoarseness, laryngitis, wheezing.  Gastrointestinal: Denies dysphagia, heartburn, reflux, water brash, pain, cramps, nausea, vomiting, bloating, diarrhea, constipation, hematemesis, melena, hematochezia, jaundice, hemorrhoids Genitourinary: Denies dysuria, frequency, urgency, nocturia, hesitancy, discharge, hematuria, flank pain Breast: Breast lumps, nipple discharge, bleeding.  Musculoskeletal: Denies arthralgia, myalgia, stiffness, Jt. Swelling, pain, limp, and strain/sprain. Denies falls. Skin: Denies puritis, rash, hives, warts, acne, eczema, changing in skin lesion Neuro: No weakness, tremor, incoordination, spasms, paresthesia, pain Psychiatric: Denies confusion, memory loss, sensory loss. Denies Depression. Endocrine: Denies change in weight, skin, hair change, nocturia, and paresthesia, diabetic polys, visual blurring, hyper / hypo glycemic episodes.  Heme/Lymph: No excessive bleeding, bruising, enlarged lymph nodes.  Physical Exam  BP 122/82    Pulse (!) 114    Temp (!) 97.3 F (36.3 C)    Ht 5' (1.524 m)    Wt 163 lb (73.9 kg)    SpO2 98%    BMI 31.83 kg/m   General Appearance:  Over  nourished and in no apparent  distress.  Eyes: PERRLA, EOMs, conjunctiva no swelling or erythema, normal fundi and vessels. Sinuses: No frontal/maxillary tenderness ENT/Mouth: EACs patent / TMs  nl. Nares clear without erythema, swelling, mucoid exudates. Oral hygiene is good. No erythema, swelling, or exudate. Tongue normal, non-obstructing. Tonsils not swollen or erythematous. Hearing normal.  Neck: Supple, thyroid not palpable. No bruits, nodes or JVD. Respiratory: Respiratory effort normal.  BS equal and clear bilateral without rales, rhonci, wheezing or stridor. Cardio: Heart sounds are normal with regular rate and rhythm and no murmurs, rubs or gallops. Peripheral pulses are normal and equal bilaterally without edema. No aortic or femoral bruits. Chest: symmetric with normal excursions and percussion. Breasts: Deferred to [facility] 11/11/2021 Abdomen: Rotund, soft with bowel sounds active. Nontender, no guarding, rebound, hernias, masses, or organomegaly.  Lymphatics: Non tender without lymphadenopathy.  Musculoskeletal: Full ROM all peripheral extremities, joint stability, 5/5 strength, and normal gait. Skin: Warm and dry without rashes, lesions, cyanosis, clubbing or  ecchymosis.  Neuro: Cranial nerves intact, reflexes equal bilaterally. Normal muscle tone, no cerebellar symptoms. Sensation intact.  Pysch: Alert and oriented X 3, normal affect, Insight and Judgment appropriate.    Assessment and Plan  1. Annual Preventative Screening Examination   2. Essential hypertension  - EKG 12-Lead - Urinalysis, Routine w reflex microscopic - Microalbumin / creatinine urine ratio - TSH  3. Hyperlipidemia, mixed  - EKG 12-Lead - Magnesium - Lipid panel - TSH  4. Abnormal glucose  - EKG 12-Lead - Hemoglobin A1c - Insulin, random  5. Vitamin D deficiency  - VITAMIN D 25 Hydroxy   6. Hypothyroidism  7. Screening for ischemic heart disease  - EKG 12-Lead  8. FHx: heart disease  - EKG 12-Lead  9. Former  smoker  - EKG 12-Lead  10. Aortic atherosclerosis ([place]) by Abd CT scan on 01/26/2-021  - EKG 12-Lead - Magnesium  11. Medication management  - Magnesium - Lipid panel - TSH -  Hemoglobin A1c - Insulin, random - VITAMIN D 25 Hydroxy  12. Screening for colorectal cancer  - POC Hemoccult Bld/Stl          Patient was counseled in prudent diet to achieve/maintain BMI less than 25 for weight control, BP monitoring, regular exercise and medications. Discussed med's effects and SE's. Screening labs and tests as requested with regular follow-up as recommended. Over 40 minutes of exam, counseling, chart review and high complex critical decision making was performed.   Kirtland Bouchard, MD

## 2021-11-30 ENCOUNTER — Other Ambulatory Visit: Payer: Self-pay | Admitting: Internal Medicine

## 2021-11-30 ENCOUNTER — Encounter: Payer: Self-pay | Admitting: Internal Medicine

## 2021-11-30 DIAGNOSIS — N3000 Acute cystitis without hematuria: Secondary | ICD-10-CM

## 2021-11-30 LAB — URINALYSIS, ROUTINE W REFLEX MICROSCOPIC
Bilirubin Urine: NEGATIVE
Glucose, UA: NEGATIVE
Hyaline Cast: NONE SEEN /LPF
Ketones, ur: NEGATIVE
Nitrite: POSITIVE — AB
Protein, ur: NEGATIVE
Specific Gravity, Urine: 1.01 (ref 1.001–1.035)
Squamous Epithelial / HPF: NONE SEEN /HPF (ref <=5)
WBC, UA: >=60 /HPF — AB (ref 0–5)
pH: 5.5 (ref 5.0–8.0)

## 2021-11-30 LAB — MICROALBUMIN / CREATININE URINE RATIO
Creatinine, Urine: 58 mg/dL (ref 20–275)
Microalb Creat Ratio: 31 mcg/mg creat — ABNORMAL HIGH (ref <30)
Microalb, Ur: 1.8 mg/dL

## 2021-11-30 LAB — VITAMIN D 25 HYDROXY (VIT D DEFICIENCY, FRACTURES): Vit D, 25-Hydroxy: 83 ng/mL (ref 30–100)

## 2021-11-30 LAB — LIPID PANEL
Cholesterol: 155 mg/dL (ref <200)
HDL: 47 mg/dL — ABNORMAL LOW (ref >=50)
LDL Cholesterol (Calc): 90 mg/dL (calc)
Non-HDL Cholesterol (Calc): 108 mg/dL (calc) (ref <130)
Total CHOL/HDL Ratio: 3.3 (calc) (ref <5.0)
Triglycerides: 89 mg/dL (ref <150)

## 2021-11-30 LAB — TSH: TSH: 3.45 mIU/L (ref 0.40–4.50)

## 2021-11-30 LAB — INSULIN, RANDOM: Insulin: 8.6 u[IU]/mL

## 2021-11-30 LAB — HEMOGLOBIN A1C
Hgb A1c MFr Bld: 5.8 % of total Hgb — ABNORMAL HIGH (ref <5.7)
Mean Plasma Glucose: 120 mg/dL
eAG (mmol/L): 6.6 mmol/L

## 2021-11-30 LAB — MAGNESIUM: Magnesium: 1.8 mg/dL (ref 1.5–2.5)

## 2021-11-30 LAB — MICROSCOPIC MESSAGE

## 2021-11-30 MED ORDER — NITROFURANTOIN MONOHYD MACRO 100 MG PO CAPS: ORAL_CAPSULE | ORAL | 0 refills | Status: DC

## 2021-11-30 NOTE — Progress Notes (Signed)
=============================================================== °-   Test results slightly outside the reference range are not unusual. If there is anything important, I will review this with you,  otherwise it is considered normal test values.  If you have further questions,  please do not hesitate to contact me at the office or via My Chart.  =============================================================== ===============================================================  -  U/A is very suspect for a UTI (Cystitis)   - Sent Rx for Macrobid to [facility] - Then resent to [facility]  - Will ask lab to do a formal culture to be sure Macrobid is  the best choice                                        - will take to to 3 days to get  final culture report back  =============================================================== ===============================================================  - Total Chol = 155   & LDL Chol = 90  - Both  Excellent   - Very low risk for Heart Attack  / Stroke ============================================================ ============================================================  -  Please continue Zetia  (Ezetimibe ) same =============================================================== ===============================================================  - A1c = 5.8% - Blood sugar and A1c are STILL elevated in the borderline and  early or pre-diabetes range which has the same   300% increased risk for heart attack, stroke, cancer and   alzheimer- type vascular dementia as full blown diabetes.   But the good news is that diet, exercise with  weight loss can cure the early diabetes at this point.  So,     - Avoid Sweets, Candy & White Stuff   - White Rice, White Potatoes, White Flour  - Breads &  Pasta =============================================================== ===============================================================  -   Magnesium  -   1.8   -  very  low- goal is betw 2.0 - 2.5,   - So..............Marland Kitchen  Recommend that you take                                                            #2 tablets  of Magnesium 500 mg daily   - also important to eat lots of  leafy green vegetables   - spinach - Kale - collards - greens - okra - asparagus  - broccoli - quinoa - squash - almonds   - black, red, white beans  -  peas - green beans =============================================================== ===============================================================  - Vitamin D - Excellent - Please keep dose same  =============================================================== ===============================================================  - Thyroid  -  also Normal / OK =============================================================== ===============================================================

## 2021-12-02 LAB — URINE CULTURE
MICRO NUMBER:: 12987978
SPECIMEN QUALITY:: ADEQUATE

## 2021-12-03 NOTE — Progress Notes (Signed)
=============================================================== °-   Test results slightly outside the reference range are not unusual. If there is anything important, I will review this with you,  otherwise it is considered normal test values.  If you have further questions,  please do not hesitate to contact me at the office or via My Chart.  =============================================================== ===============================================================  -  Urine culture did not grow a specific bacteria,   but if having symptoms consistent with an infection,   suggest complete the antibiotic.

## 2021-12-14 ENCOUNTER — Other Ambulatory Visit (HOSPITAL_COMMUNITY): Payer: Self-pay

## 2021-12-21 ENCOUNTER — Other Ambulatory Visit (HOSPITAL_COMMUNITY): Payer: Self-pay

## 2021-12-25 ENCOUNTER — Other Ambulatory Visit: Payer: Self-pay | Admitting: Adult Health

## 2021-12-25 DIAGNOSIS — E039 Hypothyroidism, unspecified: Secondary | ICD-10-CM

## 2021-12-28 ENCOUNTER — Other Ambulatory Visit: Payer: Self-pay

## 2022-01-09 ENCOUNTER — Other Ambulatory Visit: Payer: Self-pay | Admitting: Internal Medicine

## 2022-01-09 ENCOUNTER — Other Ambulatory Visit (HOSPITAL_COMMUNITY): Payer: Self-pay

## 2022-01-09 MED ORDER — AFATINIB DIMALEATE 30 MG PO TABS
ORAL_TABLET | ORAL | 2 refills | Status: DC
  Filled 2022-01-19: qty 30, 30d supply, fill #0
  Filled 2022-02-13: qty 30, 30d supply, fill #1
  Filled 2022-03-15: qty 30, 30d supply, fill #2

## 2022-01-11 ENCOUNTER — Other Ambulatory Visit: Payer: Self-pay

## 2022-01-11 ENCOUNTER — Inpatient Hospital Stay (HOSPITAL_BASED_OUTPATIENT_CLINIC_OR_DEPARTMENT_OTHER): Payer: Medicare Other | Admitting: Internal Medicine

## 2022-01-11 ENCOUNTER — Inpatient Hospital Stay: Payer: Medicare Other | Attending: Internal Medicine

## 2022-01-11 VITALS — BP 140/72 | HR 78 | Temp 97.1°F | Resp 17 | Wt 162.4 lb

## 2022-01-11 DIAGNOSIS — E785 Hyperlipidemia, unspecified: Secondary | ICD-10-CM | POA: Insufficient documentation

## 2022-01-11 DIAGNOSIS — Z5111 Encounter for antineoplastic chemotherapy: Secondary | ICD-10-CM

## 2022-01-11 DIAGNOSIS — I10 Essential (primary) hypertension: Secondary | ICD-10-CM | POA: Insufficient documentation

## 2022-01-11 DIAGNOSIS — Z90722 Acquired absence of ovaries, bilateral: Secondary | ICD-10-CM | POA: Insufficient documentation

## 2022-01-11 DIAGNOSIS — R21 Rash and other nonspecific skin eruption: Secondary | ICD-10-CM | POA: Diagnosis not present

## 2022-01-11 DIAGNOSIS — Z7952 Long term (current) use of systemic steroids: Secondary | ICD-10-CM | POA: Diagnosis not present

## 2022-01-11 DIAGNOSIS — C3490 Malignant neoplasm of unspecified part of unspecified bronchus or lung: Secondary | ICD-10-CM | POA: Diagnosis not present

## 2022-01-11 DIAGNOSIS — C3492 Malignant neoplasm of unspecified part of left bronchus or lung: Secondary | ICD-10-CM

## 2022-01-11 DIAGNOSIS — C3432 Malignant neoplasm of lower lobe, left bronchus or lung: Secondary | ICD-10-CM | POA: Diagnosis not present

## 2022-01-11 DIAGNOSIS — Z79899 Other long term (current) drug therapy: Secondary | ICD-10-CM | POA: Insufficient documentation

## 2022-01-11 DIAGNOSIS — R197 Diarrhea, unspecified: Secondary | ICD-10-CM | POA: Diagnosis not present

## 2022-01-11 DIAGNOSIS — Z923 Personal history of irradiation: Secondary | ICD-10-CM | POA: Diagnosis not present

## 2022-01-11 DIAGNOSIS — Z880 Allergy status to penicillin: Secondary | ICD-10-CM | POA: Diagnosis not present

## 2022-01-11 DIAGNOSIS — Z9221 Personal history of antineoplastic chemotherapy: Secondary | ICD-10-CM | POA: Insufficient documentation

## 2022-01-11 DIAGNOSIS — E039 Hypothyroidism, unspecified: Secondary | ICD-10-CM | POA: Insufficient documentation

## 2022-01-11 LAB — CBC WITH DIFFERENTIAL (CANCER CENTER ONLY)
Abs Immature Granulocytes: 0.02 10*3/uL (ref 0.00–0.07)
Basophils Absolute: 0 10*3/uL (ref 0.0–0.1)
Basophils Relative: 1 %
Eosinophils Absolute: 0.3 10*3/uL (ref 0.0–0.5)
Eosinophils Relative: 4 %
HCT: 36.9 % (ref 36.0–46.0)
Hemoglobin: 11.9 g/dL — ABNORMAL LOW (ref 12.0–15.0)
Immature Granulocytes: 0 %
Lymphocytes Relative: 19 %
Lymphs Abs: 1.1 10*3/uL (ref 0.7–4.0)
MCH: 26.7 pg (ref 26.0–34.0)
MCHC: 32.2 g/dL (ref 30.0–36.0)
MCV: 82.9 fL (ref 80.0–100.0)
Monocytes Absolute: 0.6 10*3/uL (ref 0.1–1.0)
Monocytes Relative: 9 %
Neutro Abs: 3.9 10*3/uL (ref 1.7–7.7)
Neutrophils Relative %: 67 %
Platelet Count: 168 10*3/uL (ref 150–400)
RBC: 4.45 MIL/uL (ref 3.87–5.11)
RDW: 15.4 % (ref 11.5–15.5)
WBC Count: 5.9 10*3/uL (ref 4.0–10.5)
nRBC: 0 % (ref 0.0–0.2)

## 2022-01-11 LAB — CMP (CANCER CENTER ONLY)
ALT: 32 U/L (ref 0–44)
AST: 30 U/L (ref 15–41)
Albumin: 3.6 g/dL (ref 3.5–5.0)
Alkaline Phosphatase: 71 U/L (ref 38–126)
Anion gap: 7 (ref 5–15)
BUN: 17 mg/dL (ref 8–23)
CO2: 27 mmol/L (ref 22–32)
Calcium: 8.8 mg/dL — ABNORMAL LOW (ref 8.9–10.3)
Chloride: 108 mmol/L (ref 98–111)
Creatinine: 0.85 mg/dL (ref 0.44–1.00)
GFR, Estimated: >60 mL/min (ref >60)
Glucose, Bld: 91 mg/dL (ref 70–99)
Potassium: 4 mmol/L (ref 3.5–5.1)
Sodium: 142 mmol/L (ref 135–145)
Total Bilirubin: 0.8 mg/dL (ref 0.3–1.2)
Total Protein: 6.4 g/dL — ABNORMAL LOW (ref 6.5–8.1)

## 2022-01-11 NOTE — Progress Notes (Signed)
?    [facility] ?Telephone:([phone]   [phone] ? ?OFFICE PROGRESS NOTE ? ?[name], MD ?[name] ?[place] [address] ? 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 ?PRIOR THERAPY:  ?1) Gilotrif 40 mg by mouth daily started 05/11/2015, status post 9 months of treatment discontinued on 02/17/2016 secondary to extensive skin rash. ?2) SBRT to the enlarging left upper lobe nodule under the care of Dr. Lisbeth Renshaw. ? ?CURRENT THERAPY: Gilotrif 30 mg by mouth daily started 02/21/2016 status post 69 months of treatment. ? ?INTERVAL HISTORY: ?Elizabeth [name] 76 y.o. female returns to the clinic today for follow-up visit.  The patient is feeling fine today with no concerning complaints except for some aching pain and sciatica.  She denied having any chest pain, shortness of breath, cough or hemoptysis.  She has no nausea, vomiting, diarrhea or constipation.  She has no headache or visual changes.  She continues to tolerate her treatment with GILOTRIF fairly well.  She is here today for evaluation and repeat blood work.  She had bronchitis few weeks ago and she was treated with Z-Pak by her primary care physician. ? ? ?MEDICAL HISTORY: ?Past Medical History:  ?Diagnosis Date  ? Adenocarcinoma of left lung, stage 4 ([place]) 04/05/2015  ? Biopsy confirmed CT SCAN: There are innumerable bilateral pulmonary nodules. These range in size from about 5 mm to 2 cm. They demonstrate irregular indistinct borders, the larger ones demonstrating spiculation. PET SCAN: Diffuse pulmonary metastatic disease with a 4 cm dominant left lower low lung lesion. No enlarged or hypermetabolic mediastinal or hilar adenopathy.  ? Allergy   ? Anxiety   ? Cancer [facility])   ? Change of skin  related to chemotherapy 09/25/2017  ? Drug-induced skin rash 01/17/2016  ? History of radiation therapy 12/18/2019  ? SBRT left lung  12/11/2019-12/18/2019   Dr Gery Pray  ? History of radiation therapy 06/21/2021  ? left lung 06/14/2021-06/21/2021 Dr Gery Pray  ? Hyperlipidemia   ? Hypertension   ? Hypothyroidism   ? Insomnia   ? Prediabetes   ? Thyroid disease   ? ? ?ALLERGIES:  is allergic to penicillins. ? ?MEDICATIONS:  ?Current Outpatient Medications  ?Medication Sig Dispense Refill  ? afatinib dimaleate (GILOTRIF) 30 MG tablet TAKE 1 TABLET (30MG) BY MOUTH ONCE DAILY. TAKE ON AN EMPTY STOMACH 1 HOUR BEFORE OR 2 HOURS AFTER A MEAL 30 tablet 2  ? ALPRAZolam (XANAX) 1 MG tablet Take 1 mg by mouth at bedtime.    ? aspirin 81 MG tablet Take 81 mg by mouth at bedtime.     ? azithromycin (ZITHROMAX) 250 MG tablet Take 2 tablets with Food on  Day 1, then 1 tablet Daily with Food for Sinusitis / Bronchitis 6 each 1  ? dexamethasone (DECADRON) 4 MG tablet Take 1 tab 3 x /day for 2 days,      then 2 x /day for 2  Days,     then 1 tab daily 13 tablet 0  ? ezetimibe (ZETIA) 10 MG tablet Take 10 mg by mouth daily.    ? hydrochlorothiazide (HYDRODIURIL) 25 MG tablet Take 25 mg by mouth daily.    ? ibuprofen (ADVIL,MOTRIN) 200 MG tablet Take 400 mg by mouth every 6 (six)  hours as needed for headache or moderate pain.    ? levothyroxine (SYNTHROID) 50 MCG tablet Take  1 tablet  Daily  on an empty stomach with only water for 30 minutes & no Antacid meds, Calcium or Magnesium for 4 hours & avoid Biotin                                             /                       TAKE                         BY            MOUTH 90 tablet 3  ? loratadine-pseudoephedrine (CLARITIN-D 12-HOUR) 5-120 MG tablet Take 1 tablet by mouth 2 (two) times daily as needed for allergies.    ? melatonin 5 MG TABS Take 5 mg by mouth at bedtime.    ? montelukast (SINGULAIR) 10 MG tablet TAKE 1 TABLET BY MOUTH DAILY FOR ALLERGIES 90 tablet 3  ?  nitrofurantoin, macrocrystal-monohydrate, (MACROBID) 100 MG capsule Take  1 capsule  2 x /day  with Food for UTI 14 capsule 0  ? ?No current facility-administered medications for this visit.  ? ? ?SURGICAL HISTORY:  ?Past Surgical History:  ?Procedure Laterality Date  ? ABDOMINAL HYSTERECTOMY  10/23/1993  ? w BSO  ? CYST REMOVAL HAND Right   ? EXCISION MASS ABDOMINAL N/A 08/11/2021  ? Procedure: EXCISIONAL BIOPSY OF LEFT RETROPERITONEAL MASS, POSSIBLE INTRAOPERATIVE LAPAROSCOPIC ULTRASOUND;  Surgeon: Michael Boston, MD;  Location: [facility];  Service: General;  Laterality: N/A;  ? LAPAROSCOPY N/A 08/11/2021  ? Procedure: LAPAROSCOPIC EXPLORATION;  Surgeon: Michael Boston, MD;  Location: [facility];  Service: General;  Laterality: N/A;  ? TONSILLECTOMY AND ADENOIDECTOMY    ? ? ?REVIEW OF SYSTEMS:  A comprehensive review of systems was negative except for: Musculoskeletal: positive for arthralgias  ? ?PHYSICAL EXAMINATION: General appearance: alert, cooperative, fatigued, and no distress ?Head: Normocephalic, without obvious abnormality, atraumatic ?Neck: no adenopathy, no JVD, supple, symmetrical, trachea midline, and thyroid not enlarged, symmetric, no tenderness/mass/nodules ?Lymph nodes: Cervical, supraclavicular, and axillary nodes normal. ?Resp: clear to auscultation bilaterally ?Back: symmetric, no curvature. ROM normal. No CVA tenderness. ?Cardio: regular rate and rhythm, S1, S2 normal, no murmur, click, rub or gallop ?GI: soft, non-tender; bowel sounds normal; no masses,  no organomegaly ?Extremities: extremities normal, atraumatic, no cyanosis or edema ? ?ECOG PERFORMANCE STATUS: 1 - Symptomatic but completely ambulatory ? ?Blood pressure 140/72, pulse 78, temperature (!) 97.1 ?F (36.2 ?C), temperature source Tympanic, resp. rate 17, weight 162 lb 6 oz (73.7 kg), SpO2 98 %. ? ?LABORATORY DATA: ?Lab Results  ?Component Value Date  ? WBC 5.9 01/11/2022  ? HGB 11.9 (L) 01/11/2022  ? HCT 36.9 01/11/2022  ? MCV 82.9  01/11/2022  ? PLT 168 01/11/2022  ? ? ?  Chemistry   ?   ?Component Value Date/Time  ? NA 142 01/11/2022 1024  ? NA 139 10/26/2017 1252  ? K 4.0 01/11/2022 1024  ? K 4.1 10/26/2017 1252  ? CL 108 01/11/2022 1024  ? CO2 27 01/11/2022 1024  ? CO2 28 10/26/2017 1252  ? BUN 17 01/11/2022 1024  ? BUN 15.7 10/26/2017 1252  ? CREATININE 0.85 01/11/2022 1024  ?  CREATININE 1.03 (H) 08/23/2021 1206  ? CREATININE 1.0 10/26/2017 1252  ?    ?Component Value Date/Time  ? CALCIUM 8.8 (L) 01/11/2022 1024  ? CALCIUM 8.8 10/26/2017 1252  ? ALKPHOS 71 01/11/2022 1024  ? ALKPHOS 70 10/26/2017 1252  ? AST 30 01/11/2022 1024  ? AST 19 10/26/2017 1252  ? ALT 32 01/11/2022 1024  ? ALT 22 10/26/2017 1252  ? BILITOT 0.8 01/11/2022 1024  ? BILITOT 0.90 10/26/2017 1252  ?  ? ? ? ?RADIOGRAPHIC STUDIES: ?No results found. ? ? ?ASSESSMENT AND PLAN:  ?This is a very pleasant 76 years old white female with stage IV non-small cell lung cancer, adenocarcinoma with positive EGFR mutation with deletion in exon 19 and currently undergoing treatment with Gilotrif initially at a dose of 40 mg for 9 months followed by 30 mg by mouth daily status post 69 months.  She also underwent SBRT to the enlarging left upper lobe lung nodule. ?The patient continues to tolerate her treatment with Gilotrif fairly well except for mild intermittent diarrhea and rash in the scalp. ?She was found on recent imaging studies of the chest, abdomen pelvis to have suspicious nodules in the left lung as well as the retroperitoneum suspicious for disease progression. ?Her recent PET scan showed hypermetabolic subcutaneous nodule anterior to the left shoulder musculature concerning for cutaneous metastasis in addition to the enlarging nodule in the retroperitoneum anterior to the left iliacus muscle that also hypermetabolic and concerning for metastatic deposit with enlarging hypermetabolic nodule and consolidation peripherally in the left upper lobe concerning for lung cancer  recurrence. ?She had surgical resection of the soft tissue lesion in the retroperitoneum anterior to the left iliacus muscle and this was consistent with follicular lymphoma. ?The patient continues to tolerate her treatme

## 2022-01-13 ENCOUNTER — Other Ambulatory Visit (HOSPITAL_COMMUNITY): Payer: Self-pay

## 2022-01-19 ENCOUNTER — Other Ambulatory Visit (HOSPITAL_COMMUNITY): Payer: Self-pay

## 2022-02-13 ENCOUNTER — Other Ambulatory Visit (HOSPITAL_COMMUNITY): Payer: Self-pay

## 2022-02-17 ENCOUNTER — Other Ambulatory Visit (HOSPITAL_COMMUNITY): Payer: Self-pay

## 2022-03-13 ENCOUNTER — Other Ambulatory Visit: Payer: Self-pay

## 2022-03-13 ENCOUNTER — Inpatient Hospital Stay: Payer: Medicare Other | Attending: Internal Medicine

## 2022-03-13 DIAGNOSIS — Z9221 Personal history of antineoplastic chemotherapy: Secondary | ICD-10-CM | POA: Insufficient documentation

## 2022-03-13 DIAGNOSIS — Z79899 Other long term (current) drug therapy: Secondary | ICD-10-CM | POA: Diagnosis not present

## 2022-03-13 DIAGNOSIS — C3432 Malignant neoplasm of lower lobe, left bronchus or lung: Secondary | ICD-10-CM | POA: Diagnosis not present

## 2022-03-13 DIAGNOSIS — C3490 Malignant neoplasm of unspecified part of unspecified bronchus or lung: Secondary | ICD-10-CM

## 2022-03-13 DIAGNOSIS — I10 Essential (primary) hypertension: Secondary | ICD-10-CM | POA: Insufficient documentation

## 2022-03-13 DIAGNOSIS — Z90722 Acquired absence of ovaries, bilateral: Secondary | ICD-10-CM | POA: Diagnosis not present

## 2022-03-13 DIAGNOSIS — E785 Hyperlipidemia, unspecified: Secondary | ICD-10-CM | POA: Diagnosis not present

## 2022-03-13 DIAGNOSIS — Z880 Allergy status to penicillin: Secondary | ICD-10-CM | POA: Insufficient documentation

## 2022-03-13 DIAGNOSIS — Z923 Personal history of irradiation: Secondary | ICD-10-CM | POA: Diagnosis not present

## 2022-03-13 DIAGNOSIS — R21 Rash and other nonspecific skin eruption: Secondary | ICD-10-CM | POA: Insufficient documentation

## 2022-03-13 DIAGNOSIS — E039 Hypothyroidism, unspecified: Secondary | ICD-10-CM | POA: Diagnosis not present

## 2022-03-13 LAB — CBC WITH DIFFERENTIAL (CANCER CENTER ONLY)
Abs Immature Granulocytes: 0.02 10*3/uL (ref 0.00–0.07)
Basophils Absolute: 0 10*3/uL (ref 0.0–0.1)
Basophils Relative: 1 %
Eosinophils Absolute: 0.2 10*3/uL (ref 0.0–0.5)
Eosinophils Relative: 4 %
HCT: 35.5 % — ABNORMAL LOW (ref 36.0–46.0)
Hemoglobin: 11.8 g/dL — ABNORMAL LOW (ref 12.0–15.0)
Immature Granulocytes: 0 %
Lymphocytes Relative: 19 %
Lymphs Abs: 1.1 10*3/uL (ref 0.7–4.0)
MCH: 28 pg (ref 26.0–34.0)
MCHC: 33.2 g/dL (ref 30.0–36.0)
MCV: 84.1 fL (ref 80.0–100.0)
Monocytes Absolute: 0.6 10*3/uL (ref 0.1–1.0)
Monocytes Relative: 10 %
Neutro Abs: 3.9 10*3/uL (ref 1.7–7.7)
Neutrophils Relative %: 66 %
Platelet Count: 187 10*3/uL (ref 150–400)
RBC: 4.22 MIL/uL (ref 3.87–5.11)
RDW: 13.5 % (ref 11.5–15.5)
WBC Count: 5.8 10*3/uL (ref 4.0–10.5)
nRBC: 0 % (ref 0.0–0.2)

## 2022-03-13 LAB — CMP (CANCER CENTER ONLY)
ALT: 14 U/L (ref 0–44)
AST: 19 U/L (ref 15–41)
Albumin: 3.6 g/dL (ref 3.5–5.0)
Alkaline Phosphatase: 61 U/L (ref 38–126)
Anion gap: 5 (ref 5–15)
BUN: 13 mg/dL (ref 8–23)
CO2: 29 mmol/L (ref 22–32)
Calcium: 8.6 mg/dL — ABNORMAL LOW (ref 8.9–10.3)
Chloride: 107 mmol/L (ref 98–111)
Creatinine: 0.85 mg/dL (ref 0.44–1.00)
GFR, Estimated: >60 mL/min (ref >60)
Glucose, Bld: 88 mg/dL (ref 70–99)
Potassium: 3.8 mmol/L (ref 3.5–5.1)
Sodium: 141 mmol/L (ref 135–145)
Total Bilirubin: 0.8 mg/dL (ref 0.3–1.2)
Total Protein: 6.5 g/dL (ref 6.5–8.1)

## 2022-03-14 ENCOUNTER — Ambulatory Visit (HOSPITAL_COMMUNITY)
Admission: RE | Admit: 2022-03-14 | Discharge: 2022-03-14 | Disposition: A | Discharge status: Home / Self Care | Payer: Medicare Other | Source: Ambulatory Visit | Attending: Internal Medicine | Admitting: Internal Medicine

## 2022-03-14 DIAGNOSIS — C3490 Malignant neoplasm of unspecified part of unspecified bronchus or lung: Secondary | ICD-10-CM | POA: Insufficient documentation

## 2022-03-14 MED ORDER — IOHEXOL 300 MG/ML  SOLN
100.0000 mL | Freq: Once | INTRAMUSCULAR | Status: AC | PRN
  Administered 2022-03-14: 100 mL via INTRAVENOUS

## 2022-03-14 MED ORDER — SODIUM CHLORIDE (PF) 0.9 % IJ SOLN
INTRAMUSCULAR | Status: AC
  Filled 2022-03-14: qty 50

## 2022-03-15 ENCOUNTER — Inpatient Hospital Stay (HOSPITAL_BASED_OUTPATIENT_CLINIC_OR_DEPARTMENT_OTHER): Payer: Medicare Other | Admitting: Internal Medicine

## 2022-03-15 ENCOUNTER — Other Ambulatory Visit (HOSPITAL_COMMUNITY): Payer: Self-pay

## 2022-03-15 ENCOUNTER — Encounter: Payer: Self-pay | Admitting: Internal Medicine

## 2022-03-15 VITALS — BP 157/84 | HR 77 | Temp 98.2°F | Resp 18 | Wt 171.4 lb

## 2022-03-15 DIAGNOSIS — C3492 Malignant neoplasm of unspecified part of left bronchus or lung: Secondary | ICD-10-CM

## 2022-03-15 DIAGNOSIS — Z5111 Encounter for antineoplastic chemotherapy: Secondary | ICD-10-CM | POA: Diagnosis not present

## 2022-03-15 DIAGNOSIS — C3432 Malignant neoplasm of lower lobe, left bronchus or lung: Secondary | ICD-10-CM | POA: Diagnosis not present

## 2022-03-15 NOTE — Progress Notes (Signed)
Elizabeth [name] [phone]   [phone]  OFFICE PROGRESS NOTE  [name], MD [address] [place] [address]  DIAGNOSIS: Stage IV (T2a, N0, M1a) non-small cell lung cancer, adenocarcinoma with positive EGFR mutation with deletion in exon 19 diagnosed in June 2016 and presented with large mass in the left lower lobe in addition to multiple bilateral pulmonary nodules  PRIOR THERAPY:  1) Gilotrif 40 mg by mouth daily started 05/11/2015, status post 9 months of treatment discontinued on 02/17/2016 secondary to extensive skin rash. 2) SBRT to the enlarging left upper lobe nodule under the care of Dr. Lisbeth Renshaw.  CURRENT THERAPY: Gilotrif 30 mg by mouth daily started 02/21/2016 status post 71 months of treatment.  INTERVAL HISTORY: Elizabeth [name] 76 y.o. female returns to the clinic today for follow-up visit.  The patient is feeling fine today with no concerning complaints.  She denied having any nausea, vomiting, diarrhea or constipation.  She denied having any skin rash or itching.  She has no chest pain, shortness of breath, cough or hemoptysis.  She has no fever or chills.  She denied having any recent weight loss or night sweats.  She has no headache or visual changes.  She continues to tolerate her treatment with dietary fairly well.  The patient had repeat CT scan of the chest, abdomen pelvis performed recently and she is here for evaluation and discussion of her scan results.   MEDICAL HISTORY: Past Medical History:  Diagnosis Date   Adenocarcinoma of left lung, stage 4 ([facility]) 04/05/2015   Biopsy confirmed CT SCAN: There are innumerable bilateral pulmonary nodules. These range in size from about 5 mm to 2 cm. They demonstrate irregular indistinct borders, the larger ones demonstrating spiculation. PET SCAN: Diffuse pulmonary metastatic disease with a 4 cm dominant left lower low lung lesion. No enlarged or hypermetabolic  mediastinal or hilar adenopathy.   Allergy    Anxiety    Cancer ([facility])    Change of skin related to chemotherapy 09/25/2017   Drug-induced skin rash 01/17/2016   History of radiation therapy 12/18/2019   SBRT left lung  12/11/2019-12/18/2019   Dr Gery Pray   History of radiation therapy 06/21/2021   left lung 06/14/2021-06/21/2021 Dr Gery Pray   Hyperlipidemia    Hypertension    Hypothyroidism    Insomnia    Prediabetes    Thyroid disease     ALLERGIES:  is allergic to penicillins.  MEDICATIONS:  Current Outpatient Medications  Medication Sig Dispense Refill   afatinib dimaleate (GILOTRIF) 30 MG tablet TAKE 1 TABLET (30MG) BY MOUTH ONCE DAILY. TAKE ON AN EMPTY STOMACH 1 HOUR BEFORE OR 2 HOURS AFTER A MEAL 30 tablet 2   ALPRAZolam (XANAX) 1 MG tablet Take 1 mg by mouth at bedtime.     aspirin 81 MG tablet Take 81 mg by mouth at bedtime.      azithromycin (ZITHROMAX) 250 MG tablet Take 2 tablets with Food on  Day 1, then 1 tablet Daily with Food for Sinusitis / Bronchitis 6 each 1   dexamethasone (DECADRON) 4 MG tablet Take 1 tab 3 x /day for 2 days,      then 2 x /day for 2  Days,     then 1 tab daily 13 tablet 0   ezetimibe (ZETIA) 10 MG tablet Take 10 mg by mouth daily.     hydrochlorothiazide (HYDRODIURIL) 25 MG tablet Take 25 mg by mouth daily.  ibuprofen (ADVIL,MOTRIN) 200 MG tablet Take 400 mg by mouth every 6 (six) hours as needed for headache or moderate pain.     levothyroxine (SYNTHROID) 50 MCG tablet Take  1 tablet  Daily  on an empty stomach with only water for 30 minutes & no Antacid meds, Calcium or Magnesium for 4 hours & avoid Biotin                                             /                       TAKE                         BY            MOUTH 90 tablet 3   loratadine-pseudoephedrine (CLARITIN-D 12-HOUR) 5-120 MG tablet Take 1 tablet by mouth 2 (two) times daily as needed for allergies.     melatonin 5 MG TABS Take 5 mg by mouth at bedtime.     montelukast  (SINGULAIR) 10 MG tablet TAKE 1 TABLET BY MOUTH DAILY FOR ALLERGIES 90 tablet 3   nitrofurantoin, macrocrystal-monohydrate, (MACROBID) 100 MG capsule Take  1 capsule  2 x /day  with Food for UTI 14 capsule 0   No current facility-administered medications for this visit.    SURGICAL HISTORY:  Past Surgical History:  Procedure Laterality Date   ABDOMINAL HYSTERECTOMY  10/23/1993   w BSO   CYST REMOVAL HAND Right    EXCISION MASS ABDOMINAL N/A 08/11/2021   Procedure: EXCISIONAL BIOPSY OF LEFT RETROPERITONEAL MASS, POSSIBLE INTRAOPERATIVE LAPAROSCOPIC ULTRASOUND;  Surgeon: Michael Boston, MD;  Location: [facility];  Service: General;  Laterality: N/A;   LAPAROSCOPY N/A 08/11/2021   Procedure: LAPAROSCOPIC EXPLORATION;  Surgeon: Michael Boston, MD;  Location: [facility];  Service: General;  Laterality: N/A;   TONSILLECTOMY AND ADENOIDECTOMY      REVIEW OF SYSTEMS:  Constitutional: negative Eyes: negative Ears, nose, mouth, throat, and face: negative Respiratory: negative Cardiovascular: negative Gastrointestinal: negative Genitourinary:negative Integument/breast: negative Hematologic/lymphatic: negative Musculoskeletal:negative Neurological: negative Behavioral/Psych: negative Endocrine: negative Allergic/Immunologic: negative   PHYSICAL EXAMINATION: General appearance: alert, cooperative, and no distress Head: Normocephalic, without obvious abnormality, atraumatic Neck: no adenopathy, no JVD, supple, symmetrical, trachea midline, and thyroid not enlarged, symmetric, no tenderness/mass/nodules Lymph nodes: Cervical, supraclavicular, and axillary nodes normal. Resp: clear to auscultation bilaterally Back: symmetric, no curvature. ROM normal. No CVA tenderness. Cardio: regular rate and rhythm, S1, S2 normal, no murmur, click, rub or gallop GI: soft, non-tender; bowel sounds normal; no masses,  no organomegaly Extremities: extremities normal, atraumatic, no cyanosis or edema Neurologic:  Alert and oriented X 3, normal strength and tone. Normal symmetric reflexes. Normal coordination and gait  ECOG PERFORMANCE STATUS: 1 - Symptomatic but completely ambulatory  Blood pressure (!) 157/84, pulse 77, temperature 98.2 F (36.8 C), temperature source Tympanic, resp. rate 18, weight 171 lb 6.4 oz (77.7 kg), SpO2 99 %.  LABORATORY DATA: Lab Results  Component Value Date   WBC 5.8 03/13/2022   HGB 11.8 (L) 03/13/2022   HCT 35.5 (L) 03/13/2022   MCV 84.1 03/13/2022   PLT 187 03/13/2022      Chemistry      Component Value Date/Time   NA 141 03/13/2022 0942   NA 139 10/26/2017 1252  K 3.8 03/13/2022 0942   K 4.1 10/26/2017 1252   CL 107 03/13/2022 0942   CO2 29 03/13/2022 0942   CO2 28 10/26/2017 1252   BUN 13 03/13/2022 0942   BUN 15.7 10/26/2017 1252   CREATININE 0.85 03/13/2022 0942   CREATININE 1.03 (H) 08/23/2021 1206   CREATININE 1.0 10/26/2017 1252      Component Value Date/Time   CALCIUM 8.6 (L) 03/13/2022 0942   CALCIUM 8.8 10/26/2017 1252   ALKPHOS 61 03/13/2022 0942   ALKPHOS 70 10/26/2017 1252   AST 19 03/13/2022 0942   AST 19 10/26/2017 1252   ALT 14 03/13/2022 0942   ALT 22 10/26/2017 1252   BILITOT 0.8 03/13/2022 0942   BILITOT 0.90 10/26/2017 1252       RADIOGRAPHIC STUDIES: No results found.   ASSESSMENT AND PLAN:  This is a very pleasant 76 years old white female with stage IV non-small cell lung cancer, adenocarcinoma with positive EGFR mutation with deletion in exon 19 and currently undergoing treatment with Gilotrif initially at a dose of 40 mg for 9 months followed by 30 mg by mouth daily status post 71 months.  She also underwent SBRT to the enlarging left upper lobe lung nodule. The patient continues to tolerate her treatment with Gilotrif fairly well except for mild intermittent diarrhea and rash in the scalp. She was found on imaging studies of the chest, abdomen pelvis to have suspicious nodules in the left lung as well as the  retroperitoneum suspicious for disease progression. Her PET scan showed hypermetabolic subcutaneous nodule anterior to the left shoulder musculature concerning for cutaneous metastasis in addition to the enlarging nodule in the retroperitoneum anterior to the left iliacus muscle that also hypermetabolic and concerning for metastatic deposit with enlarging hypermetabolic nodule and consolidation peripherally in the left upper lobe concerning for lung cancer recurrence. She had surgical resection of the soft tissue lesion in the retroperitoneum anterior to the left iliacus muscle and this was consistent with follicular lymphoma. The patient continues to tolerate her treatment with GILOTRIF fairly well with no concerning adverse effects. She had repeat CT scan of the chest, abdomen and pelvis performed recently.  I personally and independently reviewed the scan images and discussed the results with the patient today. Her scan showed no concerning findings for disease progression and she continues to have evaluation of the radiation changes in the left upper lobe. I recommended for the patient to continue her current treatment with GILOTRIF for the same dose. I will see her back for follow-up visit in 2 months for evaluation and repeat blood work. The patient was advised to call immediately if she has any concerning symptoms in the interval. The patient voices understanding of current disease status and treatment options and is in agreement with the current care plan. All questions were answered. The patient knows to call the clinic with any problems, questions or concerns. We can certainly see the patient much sooner if necessary.  Disclaimer: This note was dictated with voice recognition software. Similar sounding words can inadvertently be transcribed and may not be corrected upon review.

## 2022-03-21 ENCOUNTER — Other Ambulatory Visit (HOSPITAL_COMMUNITY): Payer: Self-pay

## 2022-03-28 ENCOUNTER — Telehealth: Payer: Self-pay | Admitting: Internal Medicine

## 2022-03-28 NOTE — Telephone Encounter (Signed)
Scheduled per 05/24 los, patient has been called and notified.

## 2022-04-13 ENCOUNTER — Other Ambulatory Visit: Payer: Self-pay | Admitting: Internal Medicine

## 2022-04-13 ENCOUNTER — Other Ambulatory Visit (HOSPITAL_COMMUNITY): Payer: Self-pay

## 2022-04-13 MED ORDER — AFATINIB DIMALEATE 30 MG PO TABS
ORAL_TABLET | ORAL | 2 refills | Status: DC
  Filled 2022-04-13: qty 30, 30d supply, fill #0
  Filled 2022-05-11: qty 30, 30d supply, fill #1
  Filled 2022-06-06: qty 30, 30d supply, fill #2

## 2022-04-18 ENCOUNTER — Ambulatory Visit (INDEPENDENT_AMBULATORY_CARE_PROVIDER_SITE_OTHER): Payer: Medicare Other | Admitting: Nurse Practitioner

## 2022-04-18 ENCOUNTER — Encounter: Payer: Self-pay | Admitting: Nurse Practitioner

## 2022-04-18 ENCOUNTER — Other Ambulatory Visit (HOSPITAL_COMMUNITY): Payer: Self-pay

## 2022-04-18 VITALS — BP 148/96 | HR 87 | Temp 97.3°F | Wt 165.2 lb

## 2022-04-18 DIAGNOSIS — R091 Pleurisy: Secondary | ICD-10-CM | POA: Diagnosis not present

## 2022-04-18 DIAGNOSIS — R051 Acute cough: Secondary | ICD-10-CM

## 2022-04-18 DIAGNOSIS — R079 Chest pain, unspecified: Secondary | ICD-10-CM

## 2022-04-18 MED ORDER — PREDNISONE 20 MG PO TABS: ORAL_TABLET | ORAL | 0 refills | Status: DC

## 2022-04-18 NOTE — Progress Notes (Signed)
Assessment and Plan:  Elizabeth [name] was seen today for an episodic visit.  Diagnoses and all order for this visit:  1. Pleurisy Considering patient is feeling better will treat with a steroid taper and continue to monitor. Patient is to contact office if not better once taper is complete. Discussed antibiotic may be prescribed. Discussed CXR if no improvement.  - predniSONE (DELTASONE) 20 MG tablet; Take 2 tabs (40 mg) for 3 days followed by 1 tab (20 mg) for 4 days.  Dispense: 10 tablet; Refill: 0  2. Acute cough Continue to monitor  3. Chest pain, unspecified type Rest  Any increase in CP or difficulty breathing report to ER or call 911. Keep follow up with Oncology 04/2022.  Notify office for further evaluation and treatment, questions or concerns if s/s fail to improve. The risks and benefits of my recommendations, as well as other treatment options were discussed with the patient today. Questions were answered.  Further disposition pending results of labs. Discussed med's effects and SE's.    Over 20 minutes of exam, counseling, chart review, and critical decision making was performed.   Future Appointments  Date Time Provider Department Center  04/18/2022 10:00 AM Adela Glimpse, NP [facility] None  05/16/2022 10:45 AM [facility] None  05/16/2022 11:15 AM Si Gaul, MD [facility] None  05/18/2022 11:00 AM Raynelle Dick, NP [facility] None  12/05/2022  2:00 PM Lucky Cowboy, MD [facility] None    ------------------------------------------------------------------------------------------------------------------   HPI BP (!) 148/96   Pulse 87   Temp (!) 97.3 F (36.3 C)   Wt 165 lb 3.2 oz (74.9 kg)   SpO2 98%   BMI 32.26 kg/m   76 y.o.female presents for left breast pain and tingling sensation that radiates into left arm.  Reports having pleurisy in the past which feels similar.  Pain started 03/30/22.  She has been taking IBU with some  relief. She has seen chiropractor for adjustment who also performed hand chest wall oscillation that seemed to help.  She has been coughing up clear mucus.  She does report having a low grade fever.  Denies any recent URI.  Has been "on the go" more in the last two weeks.  She notes wearing a very tight pair of Spanx that went up to just under her breast line that she feels may have triggered the original pain.  Denies any hemoptysis.  The pain has subsided but continues to be present enough to still "annoy her."  She has a hx of adenocarcinoma of left lung stage 4.  Diagnosed April 11, 2015.  She follows with Oncology, Dr. Arbutus Ped.  Was las seen 03/15/22.  She had a recent CT scan of chest, abdomen, pelvis, which was reviewed wit her during the last visit with Oncology.  The tests revealed no concerning findings for disease progression.  She was advised to continue tmt with GILOTRIF at the same dose and follow up in 2 months.    Past Medical History:  Diagnosis Date   Adenocarcinoma of left lung, stage 4 ([facility]) 04/05/2015   Biopsy confirmed CT SCAN: There are innumerable bilateral pulmonary nodules. These range in size from about 5 mm to 2 cm. They demonstrate irregular indistinct borders, the larger ones demonstrating spiculation. PET SCAN: Diffuse pulmonary metastatic disease with a 4 cm dominant left lower low lung lesion. No enlarged or hypermetabolic mediastinal or hilar adenopathy.   Allergy    Anxiety    Cancer ([facility])    Change  of skin related to chemotherapy 09/25/2017   Drug-induced skin rash 01/17/2016   History of radiation therapy 12/18/2019   SBRT left lung  12/11/2019-12/18/2019   Dr Antony Blackbird   History of radiation therapy 06/21/2021   left lung 06/14/2021-06/21/2021 Dr Antony Blackbird   Hyperlipidemia    Hypertension    Hypothyroidism    Insomnia    Prediabetes    Thyroid disease      Allergies  Allergen Reactions   Penicillins Swelling and Rash    Current Outpatient Medications  on File Prior to Visit  Medication Sig   afatinib dimaleate (GILOTRIF) 30 MG tablet TAKE 1 TABLET (30MG ) BY MOUTH ONCE DAILY. TAKE ON AN EMPTY STOMACH 1 HOUR BEFORE OR 2 HOURS AFTER A MEAL   ALPRAZolam (XANAX) 1 MG tablet Take 1 mg by mouth at bedtime.   aspirin 81 MG tablet Take 81 mg by mouth at bedtime.    ezetimibe (ZETIA) 10 MG tablet Take 10 mg by mouth daily.   ibuprofen (ADVIL,MOTRIN) 200 MG tablet Take 400 mg by mouth every 6 (six) hours as needed for headache or moderate pain.   levothyroxine (SYNTHROID) 50 MCG tablet Take  1 tablet  Daily  on an empty stomach with only water for 30 minutes & no Antacid meds, Calcium or Magnesium for 4 hours & avoid Biotin                                             /                       TAKE                         BY            MOUTH   loratadine-pseudoephedrine (CLARITIN-D 12-HOUR) 5-120 MG tablet Take 1 tablet by mouth 2 (two) times daily as needed for allergies.   melatonin 5 MG TABS Take 5 mg by mouth at bedtime.   montelukast (SINGULAIR) 10 MG tablet TAKE 1 TABLET BY MOUTH DAILY FOR ALLERGIES   hydrochlorothiazide (HYDRODIURIL) 25 MG tablet Take 25 mg by mouth daily. (Patient not taking: Reported on 04/18/2022)   No current facility-administered medications on file prior to visit.    ROS: all negative except what is noted in the HPI.    Physical Exam:  BP (!) 148/96   Pulse 87   Temp (!) 97.3 F (36.3 C)   Wt 165 lb 3.2 oz (74.9 kg)   SpO2 98%   BMI 32.26 kg/m   General Appearance: NAD.  Awake, conversant and cooperative. Eyes: PERRLA, EOMs intact.  Sclera white.  Conjunctiva without erythema. Sinuses: No frontal/maxillary tenderness.  No nasal discharge. Nares patent.  ENT/Mouth: Ext aud canals clear.  Bilateral TMs w/DOL and without erythema or bulging. Hearing intact.  Posterior pharynx without swelling or exudate.  Tonsils without swelling or erythema.  Neck: Supple.  No masses, nodules or thyromegaly. Respiratory: Mild  tenderness to palpation under the left axilla down to left lung base laterally.  Effort is regular with non-labored breathing. Breath sounds are equal bilaterally. Cardio: RRR with no MRGs. Brisk peripheral pulses without edema.  Abdomen: Active BS in all four quadrants.  Soft and non-tender without guarding, rebound tenderness, hernias or masses. Lymphatics: Non tender without lymphadenopathy.  Musculoskeletal: Full ROM, 5/5 strength, normal ambulation.  No clubbing or cyanosis. Skin: Appropriate color for ethnicity. Warm without rashes, lesions, ecchymosis, ulcers.  Neuro: CN II-XII grossly normal. Normal muscle tone without cerebellar symptoms and intact sensation.   Psych: AO X 3,  appropriate mood and affect, insight and judgment.     Adela Glimpse, NP 9:58 AM [facility]

## 2022-04-19 ENCOUNTER — Other Ambulatory Visit (HOSPITAL_COMMUNITY): Payer: Self-pay

## 2022-04-20 ENCOUNTER — Other Ambulatory Visit (HOSPITAL_COMMUNITY): Payer: Self-pay

## 2022-04-23 ENCOUNTER — Encounter: Payer: Self-pay | Admitting: Nurse Practitioner

## 2022-04-26 ENCOUNTER — Other Ambulatory Visit: Payer: Self-pay | Admitting: Nurse Practitioner

## 2022-04-26 DIAGNOSIS — C3490 Malignant neoplasm of unspecified part of unspecified bronchus or lung: Secondary | ICD-10-CM

## 2022-04-26 DIAGNOSIS — N644 Mastodynia: Secondary | ICD-10-CM

## 2022-05-04 LAB — HM MAMMOGRAPHY

## 2022-05-08 ENCOUNTER — Encounter: Payer: Self-pay | Admitting: Internal Medicine

## 2022-05-11 ENCOUNTER — Other Ambulatory Visit (HOSPITAL_COMMUNITY): Payer: Self-pay

## 2022-05-16 ENCOUNTER — Inpatient Hospital Stay (HOSPITAL_BASED_OUTPATIENT_CLINIC_OR_DEPARTMENT_OTHER): Payer: Medicare Other | Admitting: Internal Medicine

## 2022-05-16 ENCOUNTER — Other Ambulatory Visit: Payer: Self-pay

## 2022-05-16 ENCOUNTER — Inpatient Hospital Stay: Payer: Medicare Other | Attending: Internal Medicine

## 2022-05-16 VITALS — BP 160/83 | HR 87 | Temp 98.4°F | Resp 16 | Wt 168.4 lb

## 2022-05-16 DIAGNOSIS — Z880 Allergy status to penicillin: Secondary | ICD-10-CM | POA: Insufficient documentation

## 2022-05-16 DIAGNOSIS — E039 Hypothyroidism, unspecified: Secondary | ICD-10-CM | POA: Diagnosis not present

## 2022-05-16 DIAGNOSIS — Z923 Personal history of irradiation: Secondary | ICD-10-CM | POA: Diagnosis not present

## 2022-05-16 DIAGNOSIS — C3432 Malignant neoplasm of lower lobe, left bronchus or lung: Secondary | ICD-10-CM | POA: Diagnosis present

## 2022-05-16 DIAGNOSIS — C3492 Malignant neoplasm of unspecified part of left bronchus or lung: Secondary | ICD-10-CM

## 2022-05-16 DIAGNOSIS — I10 Essential (primary) hypertension: Secondary | ICD-10-CM | POA: Insufficient documentation

## 2022-05-16 DIAGNOSIS — Z79890 Hormone replacement therapy (postmenopausal): Secondary | ICD-10-CM | POA: Insufficient documentation

## 2022-05-16 DIAGNOSIS — R197 Diarrhea, unspecified: Secondary | ICD-10-CM | POA: Insufficient documentation

## 2022-05-16 DIAGNOSIS — C3490 Malignant neoplasm of unspecified part of unspecified bronchus or lung: Secondary | ICD-10-CM | POA: Diagnosis not present

## 2022-05-16 DIAGNOSIS — R21 Rash and other nonspecific skin eruption: Secondary | ICD-10-CM | POA: Insufficient documentation

## 2022-05-16 DIAGNOSIS — Z90722 Acquired absence of ovaries, bilateral: Secondary | ICD-10-CM | POA: Insufficient documentation

## 2022-05-16 DIAGNOSIS — Z79899 Other long term (current) drug therapy: Secondary | ICD-10-CM | POA: Insufficient documentation

## 2022-05-16 DIAGNOSIS — E785 Hyperlipidemia, unspecified: Secondary | ICD-10-CM | POA: Diagnosis not present

## 2022-05-16 LAB — CBC WITH DIFFERENTIAL (CANCER CENTER ONLY)
Abs Immature Granulocytes: 0.03 10*3/uL (ref 0.00–0.07)
Basophils Absolute: 0 10*3/uL (ref 0.0–0.1)
Basophils Relative: 1 %
Eosinophils Absolute: 0.2 10*3/uL (ref 0.0–0.5)
Eosinophils Relative: 4 %
HCT: 38.1 % (ref 36.0–46.0)
Hemoglobin: 12.7 g/dL (ref 12.0–15.0)
Immature Granulocytes: 1 %
Lymphocytes Relative: 20 %
Lymphs Abs: 1.1 10*3/uL (ref 0.7–4.0)
MCH: 28.3 pg (ref 26.0–34.0)
MCHC: 33.3 g/dL (ref 30.0–36.0)
MCV: 85 fL (ref 80.0–100.0)
Monocytes Absolute: 0.5 10*3/uL (ref 0.1–1.0)
Monocytes Relative: 9 %
Neutro Abs: 3.7 10*3/uL (ref 1.7–7.7)
Neutrophils Relative %: 65 %
Platelet Count: 227 10*3/uL (ref 150–400)
RBC: 4.48 MIL/uL (ref 3.87–5.11)
RDW: 13.2 % (ref 11.5–15.5)
WBC Count: 5.5 10*3/uL (ref 4.0–10.5)
nRBC: 0 % (ref 0.0–0.2)

## 2022-05-16 LAB — CMP (CANCER CENTER ONLY)
ALT: 18 U/L (ref 0–44)
AST: 20 U/L (ref 15–41)
Albumin: 3.9 g/dL (ref 3.5–5.0)
Alkaline Phosphatase: 69 U/L (ref 38–126)
Anion gap: 5 (ref 5–15)
BUN: 14 mg/dL (ref 8–23)
CO2: 28 mmol/L (ref 22–32)
Calcium: 8.8 mg/dL — ABNORMAL LOW (ref 8.9–10.3)
Chloride: 105 mmol/L (ref 98–111)
Creatinine: 0.88 mg/dL (ref 0.44–1.00)
GFR, Estimated: >60 mL/min (ref >60)
Glucose, Bld: 98 mg/dL (ref 70–99)
Potassium: 3.9 mmol/L (ref 3.5–5.1)
Sodium: 138 mmol/L (ref 135–145)
Total Bilirubin: 0.9 mg/dL (ref 0.3–1.2)
Total Protein: 6.5 g/dL (ref 6.5–8.1)

## 2022-05-16 NOTE — Progress Notes (Signed)
[place] [phone]   [phone]  OFFICE PROGRESS NOTE  [name], MD [address] [place]  [address]  DIAGNOSIS: Stage IV (T2a, N0, M1a) non-small cell lung cancer, adenocarcinoma with positive EGFR mutation with deletion in exon 19 diagnosed in June 2016 and presented with large mass in the left lower lobe in addition to multiple bilateral pulmonary nodules  PRIOR THERAPY:  1) Gilotrif 40 mg by mouth daily started 05/11/2015, status post 9 months of treatment discontinued on 02/17/2016 secondary to extensive skin rash. 2) SBRT to the enlarging left upper lobe nodule under the care of Dr. Lisbeth Renshaw.  CURRENT THERAPY: Gilotrif 30 mg by mouth daily started 02/21/2016 status post 73 months of treatment.  INTERVAL HISTORY: Elizabeth [name] 76 y.o. female returns to the clinic today for follow-up visit.  The patient is feeling fine today with no concerning complaints except for mild pain under the left breast.  She had evaluation by her primary care physician including mammograms that was unremarkable.  She was also treated for pleurisy with ibuprofen and has improvement in her pain.  She also received a course of prednisone during her treatment.  She denied having any current shortness of breath, cough or hemoptysis.  She has no nausea, vomiting, diarrhea or constipation.  She has no headache or visual changes.  She is here today for evaluation and repeat blood work.   MEDICAL HISTORY: Past Medical History:  Diagnosis Date   Adenocarcinoma of left lung, stage 4 ([place]) 04/05/2015   Biopsy confirmed CT SCAN: There are innumerable bilateral pulmonary nodules. These range in size from about 5 mm to 2 cm. They demonstrate irregular indistinct borders, the larger ones demonstrating spiculation. PET SCAN: Diffuse pulmonary metastatic disease with a 4 cm dominant left lower low lung lesion. No enlarged or hypermetabolic mediastinal or  hilar adenopathy.   Allergy    Anxiety    Cancer ([place])    Change of skin related to chemotherapy 09/25/2017   Drug-induced skin rash 01/17/2016   History of radiation therapy 12/18/2019   SBRT left lung  12/11/2019-12/18/2019   Dr Gery Pray   History of radiation therapy 06/21/2021   left lung 06/14/2021-06/21/2021 Dr Gery Pray   Hyperlipidemia    Hypertension    Hypothyroidism    Insomnia    Prediabetes    Thyroid disease     ALLERGIES:  is allergic to penicillins.  MEDICATIONS:  Current Outpatient Medications  Medication Sig Dispense Refill   afatinib dimaleate (GILOTRIF) 30 MG tablet TAKE 1 TABLET (30MG) BY MOUTH ONCE DAILY. TAKE ON AN EMPTY STOMACH 1 HOUR BEFORE OR 2 HOURS AFTER A MEAL 30 tablet 2   ALPRAZolam (XANAX) 1 MG tablet Take 1 mg by mouth at bedtime.     aspirin 81 MG tablet Take 81 mg by mouth at bedtime.      ezetimibe (ZETIA) 10 MG tablet Take 10 mg by mouth daily.     hydrochlorothiazide (HYDRODIURIL) 25 MG tablet Take 25 mg by mouth daily. (Patient not taking: Reported on 04/18/2022)     ibuprofen (ADVIL,MOTRIN) 200 MG tablet Take 400 mg by mouth every 6 (six) hours as needed for headache or moderate pain.     levothyroxine (SYNTHROID) 50 MCG tablet Take  1 tablet  Daily  on an empty stomach with only water for 30 minutes & no Antacid meds, Calcium or Magnesium for 4 hours & avoid Biotin                                             /  TAKE                         BY            MOUTH 90 tablet 3   loratadine-pseudoephedrine (CLARITIN-D 12-HOUR) 5-120 MG tablet Take 1 tablet by mouth 2 (two) times daily as needed for allergies.     melatonin 5 MG TABS Take 5 mg by mouth at bedtime.     montelukast (SINGULAIR) 10 MG tablet TAKE 1 TABLET BY MOUTH DAILY FOR ALLERGIES 90 tablet 3   predniSONE (DELTASONE) 20 MG tablet Take 2 tabs (40 mg) for 3 days followed by 1 tab (20 mg) for 4 days. 10 tablet 0   No current facility-administered medications for  this visit.    SURGICAL HISTORY:  Past Surgical History:  Procedure Laterality Date   ABDOMINAL HYSTERECTOMY  10/23/1993   w BSO   CYST REMOVAL HAND Right    EXCISION MASS ABDOMINAL N/A 08/11/2021   Procedure: EXCISIONAL BIOPSY OF LEFT RETROPERITONEAL MASS, POSSIBLE INTRAOPERATIVE LAPAROSCOPIC ULTRASOUND;  Surgeon: Michael Boston, MD;  Location: [facility];  Service: General;  Laterality: N/A;   LAPAROSCOPY N/A 08/11/2021   Procedure: LAPAROSCOPIC EXPLORATION;  Surgeon: Michael Boston, MD;  Location: [facility];  Service: General;  Laterality: N/A;   TONSILLECTOMY AND ADENOIDECTOMY      REVIEW OF SYSTEMS:  A comprehensive review of systems was negative except for: Respiratory: positive for pleurisy/chest pain   PHYSICAL EXAMINATION: General appearance: alert, cooperative, and no distress Head: Normocephalic, without obvious abnormality, atraumatic Neck: no adenopathy, no JVD, supple, symmetrical, trachea midline, and thyroid not enlarged, symmetric, no tenderness/mass/nodules Lymph nodes: Cervical, supraclavicular, and axillary nodes normal. Resp: clear to auscultation bilaterally Back: symmetric, no curvature. ROM normal. No CVA tenderness. Cardio: regular rate and rhythm, S1, S2 normal, no murmur, click, rub or gallop GI: soft, non-tender; bowel sounds normal; no masses,  no organomegaly Extremities: extremities normal, atraumatic, no cyanosis or edema  ECOG PERFORMANCE STATUS: 1 - Symptomatic but completely ambulatory  Blood pressure (!) 160/83, pulse 87, temperature 98.4 F (36.9 C), temperature source Oral, resp. rate 16, weight 168 lb 6.4 oz (76.4 kg), SpO2 98 %.  LABORATORY DATA: Lab Results  Component Value Date   WBC 5.8 03/13/2022   HGB 11.8 (L) 03/13/2022   HCT 35.5 (L) 03/13/2022   MCV 84.1 03/13/2022   PLT 187 03/13/2022      Chemistry      Component Value Date/Time   NA 141 03/13/2022 0942   NA 139 10/26/2017 1252   K 3.8 03/13/2022 0942   K 4.1 10/26/2017 1252    CL 107 03/13/2022 0942   CO2 29 03/13/2022 0942   CO2 28 10/26/2017 1252   BUN 13 03/13/2022 0942   BUN 15.7 10/26/2017 1252   CREATININE 0.85 03/13/2022 0942   CREATININE 1.03 (H) 08/23/2021 1206   CREATININE 1.0 10/26/2017 1252      Component Value Date/Time   CALCIUM 8.6 (L) 03/13/2022 0942   CALCIUM 8.8 10/26/2017 1252   ALKPHOS 61 03/13/2022 0942   ALKPHOS 70 10/26/2017 1252   AST 19 03/13/2022 0942   AST 19 10/26/2017 1252   ALT 14 03/13/2022 0942   ALT 22 10/26/2017 1252   BILITOT 0.8 03/13/2022 0942   BILITOT 0.90 10/26/2017 1252       RADIOGRAPHIC STUDIES: No results found.   ASSESSMENT AND PLAN:  This is a very pleasant 76 years old white female with stage  IV non-small cell lung cancer, adenocarcinoma with positive EGFR mutation with deletion in exon 19 and currently undergoing treatment with Gilotrif initially at a dose of 40 mg for 9 months followed by 30 mg by mouth daily status post 73 months.  She also underwent SBRT to the enlarging left upper lobe lung nodule. The patient continues to tolerate her treatment with Gilotrif fairly well except for mild intermittent diarrhea and rash in the scalp. She was found on imaging studies of the chest, abdomen pelvis to have suspicious nodules in the left lung as well as the retroperitoneum suspicious for disease progression. Her PET scan showed hypermetabolic subcutaneous nodule anterior to the left shoulder musculature concerning for cutaneous metastasis in addition to the enlarging nodule in the retroperitoneum anterior to the left iliacus muscle that also hypermetabolic and concerning for metastatic deposit with enlarging hypermetabolic nodule and consolidation peripherally in the left upper lobe concerning for lung cancer recurrence. She had surgical resection of the soft tissue lesion in the retroperitoneum anterior to the left iliacus muscle and this was consistent with follicular lymphoma. The patient continues to  tolerate her treatment fairly well with no concerning adverse effects. I recommended for her to continue her current treatment with GILOTRIF with the same dose. I will see her back for follow-up visit in 2 months for evaluation with repeat CT scan of the chest, abdomen and pelvis for restaging of her disease. For the left-sided pleurisy, she will continue with ibuprofen as needed basis. She was advised to call immediately if she has any other concerning symptoms in the interval. The patient voices understanding of current disease status and treatment options and is in agreement with the current care plan. All questions were answered. The patient knows to call the clinic with any problems, questions or concerns. We can certainly see the patient much sooner if necessary.  Disclaimer: This note was dictated with voice recognition software. Similar sounding words can inadvertently be transcribed and may not be corrected upon review.

## 2022-05-18 ENCOUNTER — Encounter: Payer: Self-pay | Admitting: Nurse Practitioner

## 2022-05-18 ENCOUNTER — Ambulatory Visit: Payer: Medicare Other | Admitting: Adult Health

## 2022-05-18 ENCOUNTER — Telehealth: Payer: Self-pay | Admitting: Internal Medicine

## 2022-05-18 ENCOUNTER — Other Ambulatory Visit (HOSPITAL_COMMUNITY): Payer: Self-pay

## 2022-05-18 ENCOUNTER — Ambulatory Visit (INDEPENDENT_AMBULATORY_CARE_PROVIDER_SITE_OTHER): Payer: Medicare Other | Admitting: Nurse Practitioner

## 2022-05-18 VITALS — BP 138/98 | HR 88 | Temp 96.8°F | Ht 60.0 in | Wt 167.8 lb

## 2022-05-18 DIAGNOSIS — E039 Hypothyroidism, unspecified: Secondary | ICD-10-CM

## 2022-05-18 DIAGNOSIS — I10 Essential (primary) hypertension: Secondary | ICD-10-CM

## 2022-05-18 DIAGNOSIS — C3492 Malignant neoplasm of unspecified part of left bronchus or lung: Secondary | ICD-10-CM | POA: Diagnosis not present

## 2022-05-18 DIAGNOSIS — I700 Atherosclerosis of aorta: Secondary | ICD-10-CM

## 2022-05-18 DIAGNOSIS — E782 Mixed hyperlipidemia: Secondary | ICD-10-CM

## 2022-05-18 DIAGNOSIS — E559 Vitamin D deficiency, unspecified: Secondary | ICD-10-CM

## 2022-05-18 DIAGNOSIS — R7309 Other abnormal glucose: Secondary | ICD-10-CM

## 2022-05-18 DIAGNOSIS — R6889 Other general symptoms and signs: Secondary | ICD-10-CM

## 2022-05-18 DIAGNOSIS — Z0001 Encounter for general adult medical examination with abnormal findings: Secondary | ICD-10-CM | POA: Diagnosis not present

## 2022-05-18 DIAGNOSIS — Z79899 Other long term (current) drug therapy: Secondary | ICD-10-CM

## 2022-05-18 DIAGNOSIS — E669 Obesity, unspecified: Secondary | ICD-10-CM

## 2022-05-18 DIAGNOSIS — E66811 Obesity, class 1: Secondary | ICD-10-CM

## 2022-05-18 DIAGNOSIS — G4700 Insomnia, unspecified: Secondary | ICD-10-CM

## 2022-05-18 DIAGNOSIS — F419 Anxiety disorder, unspecified: Secondary | ICD-10-CM

## 2022-05-18 DIAGNOSIS — M85852 Other specified disorders of bone density and structure, left thigh: Secondary | ICD-10-CM

## 2022-05-18 DIAGNOSIS — Z0000 Encounter for general adult medical examination without abnormal findings: Secondary | ICD-10-CM

## 2022-05-18 MED ORDER — LOSARTAN POTASSIUM 25 MG PO TABS: 25.0000 mg | ORAL_TABLET | Freq: Every day | ORAL | 11 refills | Status: DC

## 2022-05-18 NOTE — Telephone Encounter (Signed)
Scheduled per 07/25 los, patient has been called and notified.

## 2022-05-18 NOTE — Progress Notes (Signed)
MEDICARE ANNUAL WELLNESS AND FOLLOW UP  Assessment and Plan:   Annual Medicare Wellness Visit Due annually  Health maintenance reviewed  Atherosclerosis of aorta ([place]) 10/2019 Control blood pressure, cholesterol, glucose, increase exercise.  Discussed LDL goal <100  Essential hypertension Stop hydrochlorothiazide and begin Losartan 25 mg daily Follow up in 4 weeks for reevaluation Monitor blood pressure at home; call if consistently over 130/80 Continue DASH diet.   Reminder to go to the ER if any CP, SOB, nausea, dizziness, severe HA, changes vision/speech, left arm numbness and tingling and jaw pain. -     CBC with Differential/Platelet -     COMPLETE METABOLIC PANEL WITH GFR  Hyperlipidemia, mixed Has been off of statin secondary to cancer/chemo Discussed and she would prefer to start a gentle agent - zetia sent in with information. If no aggressive chemo with new lesions could transition to statin if needed. Follow up 3 months. Discussed dietary and exercise modifications Low fat diet -     Lipid panel  Acquired hypothyroidism Taking levothyroxine  Reminder to take on an empty stomach 30-28mins before first meal of the day. No antacid medications for 4 hours. 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 Earlie Server  Vitamin D deficiency Continue supplementation to maintain goal of 60-100 Taking Vitamin D 10,000 IU three times a week.  Abnormal glucose Discussed dietary and exercise modifications  Anxiety Well managed by current regimen; continue medications Stress management techniques discussed, increase water, good sleep hygiene discussed, increase exercise, and increase veggies.   Insomnia, unspecified type Discussed good sleep hygiene, decrease stimulation prior to sleep Increase day time activity Avoid caffeine in evenings Uses alprazolam PRN after failing  trazodone, gabapentin; understands risks associated with this med and wishes to continue Getting through Dr. Simonne Maffucci -   Obesity (BMI 30-34.9) Discussed dietary and exercise modifications Weight loss encouraged  Medication management Continued  Osteopenia of left femur Neck Continue weight bearing exercises DEXA ordered     Over 52min of face to face interview, exam, chart review, counseling preformed this office visit with moderate decision making.  Discussed med's effects and SE's. Screening labs and tests as requested with regular follow-up as recommended. Future Appointments  Date Time Provider [place]  07/13/2022 10:00 AM [facility] None  07/17/2022  1:45 PM Curt Bears, MD [facility] None  12/05/2022  2:00 PM [name], MD [facility] None  05/21/2023 11:00 AM Alycia Rossetti, NP [facility] None     Plan:   During the course of the visit the patient was educated and counseled about appropriate screening and preventive services including:   Pneumococcal vaccine  Prevnar 13 Influenza vaccine Td vaccine Screening electrocardiogram Bone densitometry screening Colorectal cancer screening Diabetes screening Glaucoma screening Nutrition counseling  Advanced directives: requested   HPI  76 y.o. female  presents for annual medicare wellness and 6 month follow up. She has Abnormal glucose; Anxiety; Insomnia; Vitamin D deficiency; Hypothyroidism; Essential hypertension; Hyperlipidemia, mixed; Medication management; Adenocarcinoma of left lung, stage 4 ([place]); Encounter for antineoplastic chemotherapy; Aortic atherosclerosis ([place]) by Abd CT scan on 01/26/2-021; Obesity (BMI 30.0-34.9); Osteopenia; Chest pain; and Metastatic lung cancer (metastasis from lung to other site) [facility]) on their problem list.  She has history of stage 4 lung cancer, on Gilotrif maintenance therapy and following with Dr. Julien Nordmann. Recent PET scan showed  recurrent tumor with concern for  mets. She had biopsy of nodule and referred to Dr. Sondra Come for consideration of SBRT to these 3 suspicious nodules and she will continue her current treatment with Gilotrif. 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 Earlie Server, has follow up 07/17/22.  She has anxiety/insomnia, currently prescribed xanax 1 mg by another office, tried gabapentin, trazodone without success. Now getting via Dr. Kate Sable at another office.   She has pain under her left axilla, had 2 mammograms that were normal.  Describes the pain as tingling sensation that radiates from axilla into the left breast. Oncologist recommends continuation of ibuprofen and CT scheduled for lungs in September.   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   BMI is Body mass index is 32.77 kg/m., she has not been working on diet and exercise, going to chiropractor to help with back, She is walking her dogs, one is 5 and new dog is much more active Wt Readings from Last 3 Encounters:  05/18/22 167 lb 12.8 oz (76.1 kg)  05/16/22 168 lb 6.4 oz (76.4 kg)  04/18/22 165 lb 3.2 oz (74.9 kg)   Her blood pressure has been controlled at home, today their BP is BP: (!) 138/98.   BP Readings from Last 3 Encounters:  05/18/22 (!) 138/98  05/16/22 (!) 160/83  04/18/22 (!) 148/96   She does not workout. She denies chest pain, dizziness. Denies wheezing/coughing.   She has aortic atherosclerosis per CT 10/2019.   She is on Zetia (stopped statin several years back with cancer diagnosis) and denies myalgias. Her cholesterol is not at goal. The cholesterol last visit was:  Lab Results  Component Value Date   CHOL 155 11/29/2021   HDL 47 (L) 11/29/2021   LDLCALC 90 11/29/2021   TRIG 89 11/29/2021   CHOLHDL 3.3 11/29/2021    Last A1C in the office was:  Lab Results   Component Value Date   HGBA1C 5.8 (H) 11/29/2021   Patient is on Vitamin D supplement.   Lab Results  Component Value Date   VD25OH 79 11/29/2021     She is on thyroid medication. Her medication was not changed last visit.She is not on biotin. Takes 50 mcg daily.  Lab Results  Component Value Date   TSH 3.45 11/29/2021  .   Current Medications:  Current Outpatient Medications on File Prior to Visit  Medication Sig Dispense Refill   afatinib dimaleate (GILOTRIF) 30 MG tablet TAKE 1 TABLET (30MG ) BY MOUTH ONCE DAILY. TAKE ON AN EMPTY STOMACH 1 HOUR BEFORE OR 2 HOURS AFTER A MEAL 30 tablet 2   ALPRAZolam (XANAX) 1 MG tablet Take 1 mg by mouth at bedtime.     aspirin 81 MG tablet Take 81 mg by mouth at bedtime.      ezetimibe (ZETIA) 10 MG tablet Take 10 mg by mouth daily.     hydrochlorothiazide (HYDRODIURIL) 25 MG tablet Take 25 mg by mouth daily.     ibuprofen (ADVIL,MOTRIN) 200 MG tablet Take 400 mg by mouth every 6 (six) hours as needed for headache or moderate pain.     levothyroxine (SYNTHROID) 50 MCG tablet Take  1 tablet  Daily  on an empty stomach with only water for 30 minutes & no Antacid meds, Calcium or Magnesium for 4 hours & avoid Biotin                                             /  TAKE                         BY            MOUTH 90 tablet 3   loratadine-pseudoephedrine (CLARITIN-D 12-HOUR) 5-120 MG tablet Take 1 tablet by mouth 2 (two) times daily as needed for allergies.     melatonin 5 MG TABS Take 5 mg by mouth at bedtime.     montelukast (SINGULAIR) 10 MG tablet TAKE 1 TABLET BY MOUTH DAILY FOR ALLERGIES 90 tablet 3   No current facility-administered medications on file prior to visit.    Health Maintenance:   Immunization History  Administered Date(s) Administered   Influenza Split 07/16/2018   Influenza, High Dose Seasonal PF 07/31/2014, 07/31/2014, 08/06/2017, 08/06/2017, 07/09/2019, 07/09/2019, 07/07/2020   Influenza-Unspecified  09/13/2015, 07/23/2016, 08/06/2017, 07/23/2018   Moderna Sars-Covid-2 Vaccination 12/05/2019, 01/03/2020, 07/26/2020, 05/16/2021   Pneumococcal Conjugate-13 12/24/2015   Pneumococcal Polysaccharide-23 07/22/2012   Pneumococcal-Unspecified 07/22/2012   Td 12/25/2005   Zoster, Live 01/09/2007   Tetanus: 2007 DUE declines, prefer to get PRN Pneumovax: 2013 Prevnar: 2017 Flu vaccine:2019 Zostavax: 2008 declines shingrix Covid 19: 2/2 + 2 boosters   Pap: Hysterectomy MGM:03/29/12/23 DEXA: 10/2018 will get with MGM Colonoscopy: 2014 due 2024 PET 2016 MRI brain 2016 CT AB/chest 02/2019  Vision: Dr. Wyatt Portela, last 06/13/22, glasses Dental: Dr. Dalene Carrow, last 12/2021, q4m  Patient Care Team: [name], MD as PCP - General (Internal Medicine) Sharol Roussel, [place] as Physician Assistant (Optometry) Jettie Booze, MD as Consulting Physician (Interventional Cardiology) Teena Irani, MD (Inactive) as Consulting Physician (Gastroenterology) Druscilla Brownie, MD as Consulting Physician (Dermatology) Clent Jacks, MD as Consulting Physician (Ophthalmology) Erroll Luna, MD as Consulting Physician (General Surgery) Melrose Nakayama, MD as Consulting Physician (Orthopedic Surgery) Curt Bears, MD as Consulting Physician (Oncology)  Medical History:  Past Medical History:  Diagnosis Date   Adenocarcinoma of left lung, stage 4 ([place]) 04/05/2015   Biopsy confirmed CT SCAN: There are innumerable bilateral pulmonary nodules. These range in size from about 5 mm to 2 cm. They demonstrate irregular indistinct borders, the larger ones demonstrating spiculation. PET SCAN: Diffuse pulmonary metastatic disease with a 4 cm dominant left lower low lung lesion. No enlarged or hypermetabolic mediastinal or hilar adenopathy.   Allergy    Anxiety    Cancer ([place])    Change of skin related to chemotherapy 09/25/2017   Drug-induced skin rash 01/17/2016   History of radiation therapy  12/18/2019   SBRT left lung  12/11/2019-12/18/2019   Dr Gery Pray   History of radiation therapy 06/21/2021   left lung 06/14/2021-06/21/2021 Dr Gery Pray   Hyperlipidemia    Hypertension    Hypothyroidism    Insomnia    Prediabetes    Thyroid disease    Allergies Allergies  Allergen Reactions   Penicillins Swelling and Rash   SURGICAL HISTORY She  has a past surgical history that includes Tonsillectomy and adenoidectomy; Abdominal hysterectomy (10/23/1993); Cyst removal hand (Right); laparoscopy (N/A, 08/11/2021); and Excision mass abdominal (N/A, 08/11/2021). FAMILY HISTORY Her family history includes ALS in her father; Alcohol abuse in her mother and paternal grandfather; Bone cancer in her maternal uncle; Breast cancer in her cousin; Dementia (age of onset: 72) in her paternal aunt; Dementia (age of onset: 32) in her maternal grandmother; Heart disease in her paternal grandfather; Hypertension in her father; Liver disease in her mother; Lung cancer (age of onset: 59) in her paternal grandmother;  Lung disease in her maternal grandfather; Melanoma in her paternal aunt. SOCIAL HISTORY She  reports that she quit smoking about 47 years ago. Her smoking use included cigarettes. She has a 5.00 pack-year smoking history. She has never used smokeless tobacco. She reports that she does not drink alcohol and does not use drugs.   Immunization History  Administered Date(s) Administered   Influenza Split 07/16/2018   Influenza, High Dose Seasonal PF 07/31/2014, 07/31/2014, 08/06/2017, 08/06/2017, 07/09/2019, 07/09/2019, 07/07/2020   Influenza-Unspecified 09/13/2015, 07/23/2016, 08/06/2017, 07/23/2018   Moderna Sars-Covid-2 Vaccination 12/05/2019, 01/03/2020, 07/26/2020, 05/16/2021   Pneumococcal Conjugate-13 12/24/2015   Pneumococcal Polysaccharide-23 07/22/2012   Pneumococcal-Unspecified 07/22/2012   Td 12/25/2005   Zoster, Live 01/09/2007   Health Maintenance  Topic Date Due   DEXA  SCAN  10/31/2020   COVID-19 Vaccine (5 - Booster for Moderna series) 06/03/2022 (Originally 07/11/2021)   Zoster Vaccines- Shingrix (1 of 2) 08/18/2022 (Originally 01/07/1965)   TETANUS/TDAP  05/19/2023 (Originally 12/26/2015)   INFLUENZA VACCINE  05/23/2022   COLONOSCOPY (Pts 45-23yrs Insurance coverage will need to be confirmed)  11/19/2022   Pneumonia Vaccine 64+ Years old  Completed   HPV VACCINES  Aged Out   Hepatitis C Screening  Discontinued  Mammogram 05/04/22- negative  MEDICARE WELLNESS OBJECTIVES: Physical activity: Current Exercise Habits: Home exercise routine, Type of exercise: walking, Time (Minutes): 20, Frequency (Times/Week): 3, Weekly Exercise (Minutes/Week): 60, Exercise limited by: orthopedic condition(s) Cardiac risk factors: Cardiac Risk Factors include: advanced age (>55men, >51 women);hypertension;dyslipidemia;obesity (BMI >30kg/m2);sedentary lifestyle Depression/mood screen:      05/18/2022   11:42 AM  Depression screen PHQ 2/9  Decreased Interest 0  Down, Depressed, Hopeless 0  PHQ - 2 Score 0    ADLs:     05/18/2022   11:42 AM 08/12/2021    9:00 AM  In your present state of health, do you have any difficulty performing the following activities:  Hearing? 0 0  Vision? 0 0  Difficulty concentrating or making decisions? 0 0  Walking or climbing stairs? 0 0  Dressing or bathing? 0 0  Doing errands, shopping? 0 0     Cognitive Testing  Alert? Yes  Normal Appearance?Yes  Oriented to person? Yes  Place? Yes   Time? Yes  Recall of three objects?  Yes  Can perform simple calculations? Yes  Displays appropriate judgment?Yes  Can read the correct time from a watch face?Yes  EOL planning: Does Patient Have a Medical Advance Directive?: Yes Type of Advance Directive: Healthcare Power of Attorney, Living will Does patient want to make changes to medical advance directive?: No - Patient declined Copy of [place] in Chart?: No - copy  requested   Review of Systems: Review of Systems  Constitutional:  Negative for chills, fever and malaise/fatigue.  HENT:  Negative for congestion, ear pain and sore throat.   Eyes: Negative.   Respiratory:  Positive for shortness of breath (Chronic since lung cancer). Negative for cough and wheezing.   Cardiovascular:  Negative for chest pain, palpitations and leg swelling.  Gastrointestinal:  Negative for abdominal pain, blood in stool, constipation, diarrhea, heartburn and melena.  Genitourinary: Negative.   Skin:  Negative for itching and rash.  Neurological:  Negative for dizziness, sensory change, loss of consciousness and headaches.  Psychiatric/Behavioral:  Negative for depression, memory loss, substance abuse and suicidal ideas. The patient has insomnia (managing well with meds). The patient is not nervous/anxious.     Physical Exam: Estimated body mass index is  32.77 kg/m as calculated from the following:   Height as of this encounter: 5' (1.524 m).   Weight as of this encounter: 167 lb 12.8 oz (76.1 kg). BP (!) 138/98   Pulse 88   Temp (!) 96.8 F (36 C)   Ht 5' (1.524 m)   Wt 167 lb 12.8 oz (76.1 kg)   SpO2 96%   BMI 32.77 kg/m   General Appearance: Well nourished well developed, in no apparent distress.  Eyes: PERRLA, EOMs, conjunctiva no swelling or erythema ENT/Mouth: Ear canals normal without obstruction, swelling, erythema, or discharge.  TMs normal bilaterally with no erythema, bulging, retraction, or loss of landmark.  Oropharynx moist and clear with no exudate, erythema, or swelling.  Frontal and maxillary tenderness noted. Neck: Supple, thyroid normal. No bruits.  No cervical adenopathy Respiratory: Respiratory effort normal, Breath sounds without rales.  Bilateral wheezing and rhonchi noted. Cardio: RRR without murmurs, rubs or gallops. Brisk peripheral pulses without edema.  retractions.  Abdomen: Soft, nontender, no guarding, rebound, hernias, masses, or  organomegaly.  Lymphatics: Non tender without lymphadenopathy.  Musculoskeletal: Full ROM all peripheral extremities,5/5 strength, and normal gait.  Skin:  Non-tender, warm and dry Neuro: Awake and oriented X 3, Cranial nerves intact, reflexes equal bilaterally. Normal muscle tone, no cerebellar symptoms. Sensation intact.  Psych:  normal affect, Insight and Judgment appropriate.    Medicare Attestation I have personally reviewed: The patient's medical and social history Their use of alcohol, tobacco or illicit drugs Their current medications and supplements The patient's functional ability including ADLs,fall risks, home safety risks, cognitive, and hearing and visual impairment Diet and physical activities Evidence for depression or mood disorders  The patient's weight, height, BMI, and visual acuity have been recorded in the chart.  I have made referrals, counseling, and provided education to the patient based on review of the above and I have provided the patient with a written personalized care plan for preventive services.    Alycia Rossetti, NP 12:58 PM [facility]

## 2022-05-18 NOTE — Patient Instructions (Signed)
Stop Hydrochlorothiazide Start Losartan 25 mg 1 tab every morning Follow up in 1 month to recheck blood pressure  Hypertension, Adult High blood pressure (hypertension) is when the force of blood pumping through the arteries is too strong. The arteries are the blood vessels that carry blood from the heart throughout the body. Hypertension forces the heart to work harder to pump blood and may cause arteries to become narrow or stiff. Untreated or uncontrolled hypertension can lead to a heart attack, heart failure, a stroke, kidney disease, and other problems. A blood pressure reading consists of a higher number over a lower number. Ideally, your blood pressure should be below 120/80. The first ("top") number is called the systolic pressure. It is a measure of the pressure in your arteries as your heart beats. The second ("bottom") number is called the diastolic pressure. It is a measure of the pressure in your arteries as the heart relaxes. What are the causes? The exact cause of this condition is not known. There are some conditions that result in high blood pressure. What increases the risk? Certain factors may make you more likely to develop high blood pressure. Some of these risk factors are under your control, including: Smoking. Not getting enough exercise or physical activity. Being overweight. Having too much fat, sugar, calories, or salt (sodium) in your diet. Drinking too much alcohol. Other risk factors include: Having a personal history of heart disease, diabetes, high cholesterol, or kidney disease. Stress. Having a family history of high blood pressure and high cholesterol. Having obstructive sleep apnea. Age. The risk increases with age. What are the signs or symptoms? High blood pressure may not cause symptoms. Very high blood pressure (hypertensive crisis) may cause: Headache. Fast or irregular heartbeats (palpitations). Shortness of breath. Nosebleed. Nausea and  vomiting. Vision changes. Severe chest pain, dizziness, and seizures. How is this diagnosed? This condition is diagnosed by measuring your blood pressure while you are seated, with your arm resting on a flat surface, your legs uncrossed, and your feet flat on the floor. The cuff of the blood pressure monitor will be placed directly against the skin of your upper arm at the level of your heart. Blood pressure should be measured at least twice using the same arm. Certain conditions can cause a difference in blood pressure between your right and left arms. If you have a high blood pressure reading during one visit or you have normal blood pressure with other risk factors, you may be asked to: Return on a different day to have your blood pressure checked again. Monitor your blood pressure at home for 1 week or longer. If you are diagnosed with hypertension, you may have other blood or imaging tests to help your health care provider understand your overall risk for other conditions. How is this treated? This condition is treated by making healthy lifestyle changes, such as eating healthy foods, exercising more, and reducing your alcohol intake. You may be referred for counseling on a healthy diet and physical activity. Your health care provider may prescribe medicine if lifestyle changes are not enough to get your blood pressure under control and if: Your systolic blood pressure is above 130. Your diastolic blood pressure is above 80. Your personal target blood pressure may vary depending on your medical conditions, your age, and other factors. Follow these instructions at home: Eating and drinking  Eat a diet that is high in fiber and potassium, and low in sodium, added sugar, and fat. An example of this  eating plan is called the DASH diet. DASH stands for Dietary Approaches to Stop Hypertension. To eat this way: Eat plenty of fresh fruits and vegetables. Try to fill one half of your plate at each  meal with fruits and vegetables. Eat whole grains, such as whole-wheat pasta, brown rice, or whole-grain bread. Fill about one fourth of your plate with whole grains. Eat or drink low-fat dairy products, such as skim milk or low-fat yogurt. Avoid fatty cuts of meat, processed or cured meats, and poultry with skin. Fill about one fourth of your plate with lean proteins, such as fish, chicken without skin, beans, eggs, or tofu. Avoid pre-made and processed foods. These tend to be higher in sodium, added sugar, and fat. Reduce your daily sodium intake. Many people with hypertension should eat less than 1,500 mg of sodium a day. Do not drink alcohol if: Your health care provider tells you not to drink. You are pregnant, may be pregnant, or are planning to become pregnant. If you drink alcohol: Limit how much you have to: 0-1 drink a day for women. 0-2 drinks a day for men. Know how much alcohol is in your drink. In the [place] one drink equals one 12 oz bottle of beer (355 mL), one 5 oz glass of wine (148 mL), or one 1 oz glass of hard liquor (44 mL). Lifestyle  Work with your health care provider to maintain a healthy body weight or to lose weight. Ask what an ideal weight is for you. Get at least 30 minutes of exercise that causes your heart to beat faster (aerobic exercise) most days of the week. Activities may include walking, swimming, or biking. Include exercise to strengthen your muscles (resistance exercise), such as Pilates or lifting weights, as part of your weekly exercise routine. Try to do these types of exercises for 30 minutes at least 3 days a week. Do not use any products that contain nicotine or tobacco. These products include cigarettes, chewing tobacco, and vaping devices, such as e-cigarettes. If you need help quitting, ask your health care provider. Monitor your blood pressure at home as told by your health care provider. Keep all follow-up visits. This is  important. Medicines Take over-the-counter and prescription medicines only as told by your health care provider. Follow directions carefully. Blood pressure medicines must be taken as prescribed. Do not skip doses of blood pressure medicine. Doing this puts you at risk for problems and can make the medicine less effective. Ask your health care provider about side effects or reactions to medicines that you should watch for. Contact a health care provider if you: Think you are having a reaction to a medicine you are taking. Have headaches that keep coming back (recurring). Feel dizzy. Have swelling in your ankles. Have trouble with your vision. Get help right away if you: Develop a severe headache or confusion. Have unusual weakness or numbness. Feel faint. Have severe pain in your chest or abdomen. Vomit repeatedly. Have trouble breathing. These symptoms may be an emergency. Get help right away. Call 911. Do not wait to see if the symptoms will go away. Do not drive yourself to the hospital. Summary Hypertension is when the force of blood pumping through your arteries is too strong. If this condition is not controlled, it may put you at risk for serious complications. Your personal target blood pressure may vary depending on your medical conditions, your age, and other factors. For most people, a normal blood pressure is less than 120/80.  Hypertension is treated with lifestyle changes, medicines, or a combination of both. Lifestyle changes include losing weight, eating a healthy, low-sodium diet, exercising more, and limiting alcohol. This information is not intended to replace advice given to you by your health care provider. Make sure you discuss any questions you have with your health care provider. Document Revised: 08/16/2021 Document Reviewed: 08/16/2021 Elsevier Patient Education  [place].

## 2022-05-19 LAB — COMPLETE METABOLIC PANEL WITH GFR
AG Ratio: 1.6 (calc) (ref 1.0–2.5)
ALT: 16 U/L (ref 6–29)
AST: 19 U/L (ref 10–35)
Albumin: 3.9 g/dL (ref 3.6–5.1)
Alkaline phosphatase (APISO): 70 U/L (ref 37–153)
BUN: 12 mg/dL (ref 7–25)
CO2: 26 mmol/L (ref 20–32)
Calcium: 9.1 mg/dL (ref 8.6–10.4)
Chloride: 108 mmol/L (ref 98–110)
Creat: 0.91 mg/dL (ref 0.60–1.00)
Globulin: 2.4 g/dL (calc) (ref 1.9–3.7)
Glucose, Bld: 79 mg/dL (ref 65–99)
Potassium: 4.5 mmol/L (ref 3.5–5.3)
Sodium: 141 mmol/L (ref 135–146)
Total Bilirubin: 0.6 mg/dL (ref 0.2–1.2)
Total Protein: 6.3 g/dL (ref 6.1–8.1)
eGFR: 66 mL/min/{1.73_m2} (ref >=60)

## 2022-05-19 LAB — CBC WITH DIFFERENTIAL/PLATELET
Absolute Monocytes: 554 cells/uL (ref 200–950)
Basophils Absolute: 39 cells/uL (ref 0–200)
Basophils Relative: 0.7 %
Eosinophils Absolute: 241 cells/uL (ref 15–500)
Eosinophils Relative: 4.3 %
HCT: 39 % (ref 35.0–45.0)
Hemoglobin: 12.7 g/dL (ref 11.7–15.5)
Lymphs Abs: 913 cells/uL (ref 850–3900)
MCH: 28.3 pg (ref 27.0–33.0)
MCHC: 32.6 g/dL (ref 32.0–36.0)
MCV: 87.1 fL (ref 80.0–100.0)
MPV: 10.7 fL (ref 7.5–12.5)
Monocytes Relative: 9.9 %
Neutro Abs: 3853 cells/uL (ref 1500–7800)
Neutrophils Relative %: 68.8 %
Platelets: 225 10*3/uL (ref 140–400)
RBC: 4.48 10*6/uL (ref 3.80–5.10)
RDW: 13.3 % (ref 11.0–15.0)
Total Lymphocyte: 16.3 %
WBC: 5.6 10*3/uL (ref 3.8–10.8)

## 2022-05-19 LAB — TSH: TSH: 1.65 mIU/L (ref 0.40–4.50)

## 2022-05-19 LAB — LIPID PANEL
Cholesterol: 168 mg/dL (ref <200)
HDL: 52 mg/dL (ref >=50)
LDL Cholesterol (Calc): 100 mg/dL (calc) — ABNORMAL HIGH
Non-HDL Cholesterol (Calc): 116 mg/dL (calc) (ref <130)
Total CHOL/HDL Ratio: 3.2 (calc) (ref <5.0)
Triglycerides: 75 mg/dL (ref <150)

## 2022-05-19 LAB — HEMOGLOBIN A1C
Hgb A1c MFr Bld: 5.5 % of total Hgb (ref <5.7)
Mean Plasma Glucose: 111 mg/dL
eAG (mmol/L): 6.2 mmol/L

## 2022-05-24 ENCOUNTER — Other Ambulatory Visit: Payer: Self-pay | Admitting: Internal Medicine

## 2022-05-24 ENCOUNTER — Other Ambulatory Visit (HOSPITAL_COMMUNITY): Payer: Self-pay

## 2022-05-24 MED ORDER — EZETIMIBE 10 MG PO TABS
10.0000 mg | ORAL_TABLET | Freq: Every day | ORAL | 3 refills | Status: DC
  Filled 2022-05-24: qty 90, 90d supply, fill #0
  Filled 2022-09-01: qty 90, 90d supply, fill #1
  Filled 2022-09-06 – 2022-12-16 (×2): qty 90, 90d supply, fill #2
  Filled 2023-03-23: qty 90, 90d supply, fill #3

## 2022-06-06 ENCOUNTER — Other Ambulatory Visit (HOSPITAL_COMMUNITY): Payer: Self-pay

## 2022-06-12 ENCOUNTER — Other Ambulatory Visit: Payer: Self-pay | Admitting: Nurse Practitioner

## 2022-06-12 ENCOUNTER — Telehealth: Payer: Self-pay | Admitting: Nurse Practitioner

## 2022-06-12 DIAGNOSIS — M79622 Pain in left upper arm: Secondary | ICD-10-CM

## 2022-06-12 NOTE — Telephone Encounter (Signed)
I have placed an ultrasound request but will be at the breast center.  They will call her with appointment

## 2022-06-12 NOTE — Telephone Encounter (Signed)
Got a mammogram with [facility] and it showed nothing in her breast that would explain the pain she has in her armpits. The pain has gotten worse and she is wanting some more imaging done if possible

## 2022-06-16 NOTE — Progress Notes (Unsigned)
Assessment and Plan:  There are no diagnoses linked to this encounter.    Further disposition pending results of labs. Discussed med's effects and SE's.   Over 30 minutes of exam, counseling, chart review, and critical decision making was performed.   Future Appointments  Date Time Provider E[place]  06/19/2022 11:30 AM Alycia Rossetti, NP [facility] None  07/13/2022 10:00 AM [facility] None  07/17/2022  1:45 PM Curt Bears, MD [facility] None  12/05/2022  2:00 PM [name], MD [facility] None  05/21/2023 11:00 AM Alycia Rossetti, NP [facility] None    ------------------------------------------------------------------------------------------------------------------   HPI There were no vitals taken for this visit. 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 These range in size from about 5 mm to 2 cm. They demonstrate irregular indistinct borders, the larger ones demonstrating spiculation. PET SCAN: Diffuse pulmonary metastatic disease with a 4 cm dominant left lower low lung lesion. No enlarged or hypermetabolic mediastinal or hilar adenopathy.   Allergy    Anxiety    Cancer ([place])    Change of skin related to chemotherapy 09/25/2017   Drug-induced skin rash 01/17/2016   History of radiation therapy 12/18/2019   SBRT left lung  12/11/2019-12/18/2019   Dr Gery Pray   History of radiation therapy 06/21/2021   left lung 06/14/2021-06/21/2021 Dr Gery Pray   Hyperlipidemia    Hypertension    Hypothyroidism    Insomnia    Prediabetes    Thyroid disease      Allergies  Allergen Reactions   Penicillins Swelling and Rash    Current Outpatient Medications on File Prior to Visit  Medication Sig   afatinib dimaleate (GILOTRIF) 30 MG tablet TAKE 1 TABLET (30MG ) BY MOUTH ONCE DAILY.  TAKE ON AN EMPTY STOMACH 1 HOUR BEFORE OR 2 HOURS AFTER A MEAL   ALPRAZolam (XANAX) 1 MG tablet Take 1 mg by mouth at bedtime.   aspirin 81 MG tablet Take 81 mg by mouth at bedtime.    ezetimibe (ZETIA) 10 MG tablet Take 1 tablet (10 mg total) by mouth daily.   hydrochlorothiazide (HYDRODIURIL) 25 MG tablet Take 25 mg by mouth daily.   ibuprofen (ADVIL,MOTRIN) 200 MG tablet Take 400 mg by mouth every 6 (six) hours as needed for headache or moderate pain.   levothyroxine (SYNTHROID) 50 MCG tablet Take  1 tablet  Daily  on an empty stomach with only water for 30 minutes & no Antacid meds, Calcium or Magnesium for 4 hours & avoid Biotin                                             /                       TAKE                         BY            MOUTH   loratadine-pseudoephedrine (CLARITIN-D 12-HOUR) 5-120 MG tablet Take 1 tablet by mouth 2 (two) times daily as needed for allergies.   losartan (COZAAR) 25 MG tablet Take 1 tablet (25 mg total) by mouth daily.  melatonin 5 MG TABS Take 5 mg by mouth at bedtime.   montelukast (SINGULAIR) 10 MG tablet TAKE 1 TABLET BY MOUTH DAILY FOR ALLERGIES   No current facility-administered medications on file prior to visit.    ROS: all negative except above.   Physical Exam:  There were no vitals taken for this visit.  General Appearance: Well nourished, in no apparent distress. Eyes: PERRLA, EOMs, conjunctiva no swelling or erythema Sinuses: No Frontal/maxillary tenderness ENT/Mouth: Ext aud canals clear, TMs without erythema, bulging. No erythema, swelling, or exudate on post pharynx.  Tonsils not swollen or erythematous. Hearing normal.  Neck: Supple, thyroid normal.  Respiratory: Respiratory effort normal, BS equal bilaterally without rales, rhonchi, wheezing or stridor.  Cardio: RRR with no MRGs. Brisk peripheral pulses without edema.  Abdomen: Soft, + BS.  Non tender, no guarding, rebound, hernias, masses. Lymphatics: Non tender without  lymphadenopathy.  Musculoskeletal: Full ROM, 5/5 strength, normal gait.  Skin: Warm, dry without rashes, lesions, ecchymosis.  Neuro: Cranial nerves intact. Normal muscle tone, no cerebellar symptoms. Sensation intact.  Psych: Awake and oriented X 3, normal affect, Insight and Judgment appropriate.     Alycia Rossetti, NP 5:02 AM [facility]

## 2022-06-19 ENCOUNTER — Encounter: Payer: Self-pay | Admitting: Nurse Practitioner

## 2022-06-19 ENCOUNTER — Other Ambulatory Visit (HOSPITAL_COMMUNITY): Payer: Self-pay

## 2022-06-19 ENCOUNTER — Ambulatory Visit (INDEPENDENT_AMBULATORY_CARE_PROVIDER_SITE_OTHER): Payer: Medicare Other | Admitting: Nurse Practitioner

## 2022-06-19 VITALS — BP 140/78 | HR 80 | Temp 97.3°F | Ht 60.0 in | Wt 171.6 lb

## 2022-06-19 DIAGNOSIS — M79622 Pain in left upper arm: Secondary | ICD-10-CM

## 2022-06-19 DIAGNOSIS — I10 Essential (primary) hypertension: Secondary | ICD-10-CM

## 2022-06-19 NOTE — Patient Instructions (Signed)
Please check blood pressure at least 3 times a week.  If running consistently 130/80 or higher take 2 Losartan daily  Losartan Tablets What is this medication? LOSARTAN (loe SAR tan) treats high blood pressure. It may also be used to prevent a stroke in people with heart disease and high blood pressure. It can be used to prevent kidney damage in people with diabetes. It works by relaxing the blood vessels, which helps decrease the amount of work your heart has to do. It belongs to a group of medications called ARBs. This medicine may be used for other purposes; ask your health care provider or pharmacist if you have questions. COMMON BRAND NAME(S): Cozaar What should I tell my care team before I take this medication? They need to know if you have any of these conditions: Heart failure Kidney disease Liver disease An unusual or allergic reaction to losartan, other medications, foods, dyes, or preservatives Pregnant or trying to get pregnant Breast-feeding How should I use this medication? Take this medication by mouth. Take it as directed on the prescription label at the same time every day. You can take it with or without food. If it upsets your stomach, take it with food. Keep taking it unless your care team tells you to stop. Talk to your care team about the use of this medication in children. While it may be prescribed for children as young as 6 for selected conditions, precautions do apply. Overdosage: If you think you have taken too much of this medicine contact a poison control center or emergency room at once. NOTE: This medicine is only for you. Do not share this medicine with others. What if I miss a dose? If you miss a dose, take it as soon as you can. If it is almost time for your next dose, take only that dose. Do not take double or extra doses. What may interact with this medication? Aliskiren ACE inhibitors, like enalapril or lisinopril Diuretics, especially amiloride,  eplerenone, spironolactone, or triamterene Lithium NSAIDs, medications for pain and inflammation, like ibuprofen or naproxen Potassium salts or potassium supplements This list may not describe all possible interactions. Give your health care provider a list of all the medicines, herbs, non-prescription drugs, or dietary supplements you use. Also tell them if you smoke, drink alcohol, or use illegal drugs. Some items may interact with your medicine. What should I watch for while using this medication? Visit your care team for regular check ups. Check your blood pressure as directed. Ask your care team what your blood pressure should be. Also, find out when you should contact them. Do not treat yourself for coughs, colds, or pain while you are using this medication without asking your care team for advice. Some medications may increase your blood pressure. Women should inform their care team if they wish to become pregnant or think they might be pregnant. There is a potential for serious side effects to an unborn child. Talk to your care team for more information. You may get drowsy or dizzy. Do not drive, use machinery, or do anything that needs mental alertness until you know how this medication affects you. Do not stand or sit up quickly, especially if you are an older patient. This reduces the risk of dizzy or fainting spells. Alcohol can make you more drowsy and dizzy. Avoid alcoholic drinks. Avoid salt substitutes unless you are told otherwise by your care team. What side effects may I notice from receiving this medication? Side effects that you  should report to your care team as soon as possible: Allergic reactions--skin rash, itching, hives, swelling of the face, lips, tongue, or throat High potassium level--muscle weakness, fast or irregular heartbeat Kidney injury--decrease in the amount of urine, swelling of the ankles, hands, or feet Low blood pressure--dizziness, feeling faint or  lightheaded, blurry vision Side effects that usually do not require medical attention (report to your care team if they continue or are bothersome): Dizziness Headache Runny or stuffy nose This list may not describe all possible side effects. Call your doctor for medical advice about side effects. You may report side effects to FDA at 1-800-FDA-1088. Where should I keep my medication? Keep out of the reach of children and pets. Store at room temperature between 20 and 25 degrees C (68 and 77 degrees F). Protect from light. Keep the container tightly closed. Get rid of any unused medication after the expiration date. To get rid of medications that are no longer needed or have expired: Take the medication to a medication take-back program. Check with your pharmacy or law enforcement to find a location. If you cannot return the medication, check the label or package insert to see if the medication should be thrown out in the garbage or flushed down the toilet. If you are not sure, ask your care team. If it is safe to put in the trash, empty the medication out of the container. Mix the medication with cat litter, dirt, coffee grounds, or other unwanted substance. Seal the mixture in a bag or container. Put it in the trash. NOTE: This sheet is a summary. It may not cover all possible information. If you have questions about this medicine, talk to your doctor, pharmacist, or health care provider.  2023 Elsevier/Gold Standard (2020-10-05 00:00:00)

## 2022-06-20 LAB — HM MAMMOGRAPHY

## 2022-06-23 ENCOUNTER — Other Ambulatory Visit: Payer: Self-pay

## 2022-06-23 MED ORDER — MONTELUKAST SODIUM 10 MG PO TABS: ORAL_TABLET | ORAL | 3 refills | Status: DC

## 2022-06-29 ENCOUNTER — Encounter: Payer: Self-pay | Admitting: Internal Medicine

## 2022-07-11 ENCOUNTER — Other Ambulatory Visit (HOSPITAL_COMMUNITY): Payer: Self-pay

## 2022-07-12 ENCOUNTER — Other Ambulatory Visit: Payer: Self-pay | Admitting: Internal Medicine

## 2022-07-12 ENCOUNTER — Other Ambulatory Visit (HOSPITAL_COMMUNITY): Payer: Self-pay

## 2022-07-12 MED ORDER — AFATINIB DIMALEATE 30 MG PO TABS
ORAL_TABLET | ORAL | 2 refills | Status: DC
  Filled 2022-07-12: qty 30, 30d supply, fill #0
  Filled 2022-08-09: qty 30, 30d supply, fill #1
  Filled 2022-09-19: qty 30, 30d supply, fill #2

## 2022-07-13 ENCOUNTER — Other Ambulatory Visit (HOSPITAL_COMMUNITY): Payer: Self-pay

## 2022-07-13 ENCOUNTER — Inpatient Hospital Stay: Payer: Medicare Other | Attending: Internal Medicine

## 2022-07-13 ENCOUNTER — Ambulatory Visit (HOSPITAL_COMMUNITY)
Admission: RE | Admit: 2022-07-13 | Discharge: 2022-07-13 | Disposition: A | Discharge status: Home / Self Care | Payer: Medicare Other | Source: Ambulatory Visit | Attending: Internal Medicine | Admitting: Internal Medicine

## 2022-07-13 DIAGNOSIS — C3490 Malignant neoplasm of unspecified part of unspecified bronchus or lung: Secondary | ICD-10-CM | POA: Diagnosis present

## 2022-07-13 DIAGNOSIS — Z7982 Long term (current) use of aspirin: Secondary | ICD-10-CM | POA: Insufficient documentation

## 2022-07-13 DIAGNOSIS — Z923 Personal history of irradiation: Secondary | ICD-10-CM | POA: Insufficient documentation

## 2022-07-13 DIAGNOSIS — C3432 Malignant neoplasm of lower lobe, left bronchus or lung: Secondary | ICD-10-CM | POA: Insufficient documentation

## 2022-07-13 DIAGNOSIS — Z79899 Other long term (current) drug therapy: Secondary | ICD-10-CM | POA: Insufficient documentation

## 2022-07-13 LAB — CBC WITH DIFFERENTIAL (CANCER CENTER ONLY)
Abs Immature Granulocytes: 0.01 10*3/uL (ref 0.00–0.07)
Basophils Absolute: 0.1 10*3/uL (ref 0.0–0.1)
Basophils Relative: 1 %
Eosinophils Absolute: 0.2 10*3/uL (ref 0.0–0.5)
Eosinophils Relative: 3 %
HCT: 35.3 % — ABNORMAL LOW (ref 36.0–46.0)
Hemoglobin: 11.7 g/dL — ABNORMAL LOW (ref 12.0–15.0)
Immature Granulocytes: 0 %
Lymphocytes Relative: 18 %
Lymphs Abs: 1.2 10*3/uL (ref 0.7–4.0)
MCH: 28.7 pg (ref 26.0–34.0)
MCHC: 33.1 g/dL (ref 30.0–36.0)
MCV: 86.5 fL (ref 80.0–100.0)
Monocytes Absolute: 0.6 10*3/uL (ref 0.1–1.0)
Monocytes Relative: 9 %
Neutro Abs: 4.6 10*3/uL (ref 1.7–7.7)
Neutrophils Relative %: 69 %
Platelet Count: 182 10*3/uL (ref 150–400)
RBC: 4.08 MIL/uL (ref 3.87–5.11)
RDW: 13.2 % (ref 11.5–15.5)
WBC Count: 6.6 10*3/uL (ref 4.0–10.5)
nRBC: 0 % (ref 0.0–0.2)

## 2022-07-13 LAB — CMP (CANCER CENTER ONLY)
ALT: 14 U/L (ref 0–44)
AST: 21 U/L (ref 15–41)
Albumin: 3.8 g/dL (ref 3.5–5.0)
Alkaline Phosphatase: 61 U/L (ref 38–126)
Anion gap: 5 (ref 5–15)
BUN: 14 mg/dL (ref 8–23)
CO2: 29 mmol/L (ref 22–32)
Calcium: 8.5 mg/dL — ABNORMAL LOW (ref 8.9–10.3)
Chloride: 108 mmol/L (ref 98–111)
Creatinine: 0.8 mg/dL (ref 0.44–1.00)
GFR, Estimated: >60 mL/min (ref >60)
Glucose, Bld: 86 mg/dL (ref 70–99)
Potassium: 4.1 mmol/L (ref 3.5–5.1)
Sodium: 142 mmol/L (ref 135–145)
Total Bilirubin: 0.8 mg/dL (ref 0.3–1.2)
Total Protein: 6.2 g/dL — ABNORMAL LOW (ref 6.5–8.1)

## 2022-07-13 MED ORDER — IOHEXOL 300 MG/ML  SOLN
100.0000 mL | Freq: Once | INTRAMUSCULAR | Status: AC | PRN
  Administered 2022-07-13: 100 mL via INTRAVENOUS

## 2022-07-13 MED ORDER — SODIUM CHLORIDE (PF) 0.9 % IJ SOLN
INTRAMUSCULAR | Status: AC
  Filled 2022-07-13: qty 50

## 2022-07-14 ENCOUNTER — Other Ambulatory Visit (HOSPITAL_COMMUNITY): Payer: Self-pay

## 2022-07-17 ENCOUNTER — Inpatient Hospital Stay (HOSPITAL_BASED_OUTPATIENT_CLINIC_OR_DEPARTMENT_OTHER): Payer: Medicare Other | Admitting: Internal Medicine

## 2022-07-17 ENCOUNTER — Other Ambulatory Visit: Payer: Self-pay

## 2022-07-17 VITALS — BP 160/82 | HR 85 | Temp 97.9°F | Resp 18

## 2022-07-17 DIAGNOSIS — Z923 Personal history of irradiation: Secondary | ICD-10-CM | POA: Diagnosis not present

## 2022-07-17 DIAGNOSIS — C3492 Malignant neoplasm of unspecified part of left bronchus or lung: Secondary | ICD-10-CM | POA: Diagnosis not present

## 2022-07-17 DIAGNOSIS — Z79899 Other long term (current) drug therapy: Secondary | ICD-10-CM | POA: Diagnosis not present

## 2022-07-17 DIAGNOSIS — Z7982 Long term (current) use of aspirin: Secondary | ICD-10-CM | POA: Diagnosis not present

## 2022-07-17 DIAGNOSIS — C3432 Malignant neoplasm of lower lobe, left bronchus or lung: Secondary | ICD-10-CM | POA: Diagnosis present

## 2022-07-17 NOTE — Progress Notes (Signed)
[place] [phone]   [phone]  OFFICE PROGRESS NOTE  [name], MD [address] [place] [address]  DIAGNOSIS: Stage IV (T2a, N0, M1a) non-small cell lung cancer, adenocarcinoma with positive EGFR mutation with deletion in exon 19 diagnosed in June 2016 and presented with large mass in the left lower lobe in addition to multiple bilateral pulmonary nodules  PRIOR THERAPY:  1) Gilotrif 40 mg by mouth daily started 05/11/2015, status post 9 months of treatment discontinued on 02/17/2016 secondary to extensive skin rash. 2) SBRT to the enlarging left upper lobe nodule under the care of Dr. Lisbeth Renshaw.  CURRENT THERAPY: Gilotrif 30 mg by mouth daily started 02/21/2016 status post 75 months of treatment.  INTERVAL HISTORY: Elizabeth [name] 76 y.o. female returns to the clinic today for follow-up visit.  The patient has been complaining of increasing fatigue and weakness recently.  She denied having any current shortness of breath but has some mild pain on the left side of the chest from the recently found broken ribs.  She denied having any cough or hemoptysis.  She has no nausea, vomiting, diarrhea or constipation.  She has no recent weight loss or night sweats.  She has no headache or visual changes.  The patient continues to tolerate her treatment with GILOTRIF fairly well.  She had repeat CT scan of the chest, abdomen and pelvis performed recently and she is here for evaluation and discussion of her scan results.   MEDICAL HISTORY: Past Medical History:  Diagnosis Date   Adenocarcinoma of left lung, stage 4 ([place]) 04/05/2015   Biopsy confirmed CT SCAN: There are innumerable bilateral pulmonary nodules. These range in size from about 5 mm to 2 cm. They demonstrate irregular indistinct borders, the larger ones demonstrating spiculation. PET SCAN: Diffuse pulmonary metastatic disease with a 4 cm dominant left lower low lung  lesion. No enlarged or hypermetabolic mediastinal or hilar adenopathy.   Allergy    Anxiety    Cancer ([place])    Change of skin related to chemotherapy 09/25/2017   Drug-induced skin rash 01/17/2016   History of radiation therapy 12/18/2019   SBRT left lung  12/11/2019-12/18/2019   Dr Gery Pray   History of radiation therapy 06/21/2021   left lung 06/14/2021-06/21/2021 Dr Gery Pray   Hyperlipidemia    Hypertension    Hypothyroidism    Insomnia    Prediabetes    Thyroid disease     ALLERGIES:  is allergic to penicillins.  MEDICATIONS:  Current Outpatient Medications  Medication Sig Dispense Refill   afatinib dimaleate (GILOTRIF) 30 MG tablet TAKE 1 TABLET (30MG) BY MOUTH ONCE DAILY. TAKE ON AN EMPTY STOMACH 1 HOUR BEFORE OR 2 HOURS AFTER A MEAL 30 tablet 2   ALPRAZolam (XANAX) 1 MG tablet Take 1 mg by mouth at bedtime.     aspirin 81 MG tablet Take 81 mg by mouth at bedtime.      ezetimibe (ZETIA) 10 MG tablet Take 1 tablet (10 mg total) by mouth daily. 90 tablet 3   ibuprofen (ADVIL,MOTRIN) 200 MG tablet Take 400 mg by mouth every 6 (six) hours as needed for headache or moderate pain.     levothyroxine (SYNTHROID) 50 MCG tablet Take  1 tablet  Daily  on an empty stomach with only water for 30 minutes & no Antacid meds, Calcium or Magnesium for 4 hours & avoid Biotin                                             /  TAKE                         BY            MOUTH 90 tablet 3   loratadine-pseudoephedrine (CLARITIN-D 12-HOUR) 5-120 MG tablet Take 1 tablet by mouth 2 (two) times daily as needed for allergies.     losartan (COZAAR) 25 MG tablet Take 1 tablet (25 mg total) by mouth daily. 30 tablet 11   melatonin 5 MG TABS Take 5 mg by mouth at bedtime.     methocarbamol (ROBAXIN) 500 MG tablet Take 500 mg by mouth 3 (three) times daily. 2-3 times a day     montelukast (SINGULAIR) 10 MG tablet Take one tablet for allergies 90 tablet 3   No current facility-administered  medications for this visit.    SURGICAL HISTORY:  Past Surgical History:  Procedure Laterality Date   ABDOMINAL HYSTERECTOMY  10/23/1993   w BSO   CYST REMOVAL HAND Right    EXCISION MASS ABDOMINAL N/A 08/11/2021   Procedure: EXCISIONAL BIOPSY OF LEFT RETROPERITONEAL MASS, POSSIBLE INTRAOPERATIVE LAPAROSCOPIC ULTRASOUND;  Surgeon: Michael Boston, MD;  Location: [facility];  Service: General;  Laterality: N/A;   LAPAROSCOPY N/A 08/11/2021   Procedure: LAPAROSCOPIC EXPLORATION;  Surgeon: Michael Boston, MD;  Location: [facility];  Service: General;  Laterality: N/A;   TONSILLECTOMY AND ADENOIDECTOMY      REVIEW OF SYSTEMS:  Constitutional: positive for fatigue Eyes: negative Ears, nose, mouth, throat, and face: negative Respiratory: positive for pleurisy/chest pain Cardiovascular: negative Gastrointestinal: negative Genitourinary:negative Integument/breast: negative Hematologic/lymphatic: negative Musculoskeletal:negative Neurological: negative Behavioral/Psych: negative Endocrine: negative Allergic/Immunologic: negative   PHYSICAL EXAMINATION: General appearance: alert, cooperative, fatigued, and no distress Head: Normocephalic, without obvious abnormality, atraumatic Neck: no adenopathy, no JVD, supple, symmetrical, trachea midline, and thyroid not enlarged, symmetric, no tenderness/mass/nodules Lymph nodes: Cervical, supraclavicular, and axillary nodes normal. Resp: clear to auscultation bilaterally Back: symmetric, no curvature. ROM normal. No CVA tenderness. Cardio: regular rate and rhythm, S1, S2 normal, no murmur, click, rub or gallop GI: soft, non-tender; bowel sounds normal; no masses,  no organomegaly Extremities: extremities normal, atraumatic, no cyanosis or edema Neurologic: Alert and oriented X 3, normal strength and tone. Normal symmetric reflexes. Normal coordination and gait  ECOG PERFORMANCE STATUS: 1 - Symptomatic but completely ambulatory  Blood pressure (!)  160/82, pulse 85, temperature 97.9 F (36.6 C), temperature source Oral, resp. rate 18, SpO2 93 %.  LABORATORY DATA: Lab Results  Component Value Date   WBC 6.6 07/13/2022   HGB 11.7 (L) 07/13/2022   HCT 35.3 (L) 07/13/2022   MCV 86.5 07/13/2022   PLT 182 07/13/2022      Chemistry      Component Value Date/Time   NA 142 07/13/2022 1325   NA 139 10/26/2017 1252   K 4.1 07/13/2022 1325   K 4.1 10/26/2017 1252   CL 108 07/13/2022 1325   CO2 29 07/13/2022 1325   CO2 28 10/26/2017 1252   BUN 14 07/13/2022 1325   BUN 15.7 10/26/2017 1252   CREATININE 0.80 07/13/2022 1325   CREATININE 0.91 05/18/2022 1156   CREATININE 1.0 10/26/2017 1252      Component Value Date/Time   CALCIUM 8.5 (L) 07/13/2022 1325   CALCIUM 8.8 10/26/2017 1252   ALKPHOS 61 07/13/2022 1325   ALKPHOS 70 10/26/2017 1252   AST 21 07/13/2022 1325   AST 19 10/26/2017 1252   ALT 14 07/13/2022 1325   ALT 22  10/26/2017 1252   BILITOT 0.8 07/13/2022 1325   BILITOT 0.90 10/26/2017 1252       RADIOGRAPHIC STUDIES: CT Chest W Contrast  Result Date: 07/14/2022 CLINICAL DATA:  Non-small cell lung cancer restaging. Pain in the left breast extending to the axilla since June 2023. * Tracking Code: BO * EXAM: CT CHEST, ABDOMEN, AND PELVIS WITH CONTRAST TECHNIQUE: Multidetector CT imaging of the chest, abdomen and pelvis was performed following the standard protocol during bolus administration of intravenous contrast. RADIATION DOSE REDUCTION: This exam was performed according to the departmental dose-optimization program which includes automated exposure control, adjustment of the mA and/or kV according to patient size and/or use of iterative reconstruction technique. CONTRAST:  122m OMNIPAQUE IOHEXOL 300 MG/ML  SOLN COMPARISON:  Multiple exams, including 03/14/2022 FINDINGS: CT CHEST FINDINGS Cardiovascular: Atherosclerotic calcification in the descending thoracic aorta. Mediastinum/Nodes: Unremarkable Lungs/Pleura: Stable  appearance scattered sub solid nodularity of the right lung. Index focus of nodularity in the right lower lobe measures 1.0 by 0.7 cm on image 67 series 6, formerly 1.1 by 0.7 by my measurements. Mildly reduced thickness of the band of volume loss and consolidation in the left upper lobe on image 41 series 6. Similar inferior extension of this bandlike process in the left perihilar region as on image 67 series 6 without substantial contour change. There several other smaller bandlike regions of scarring or consolidation in the left lung. No findings of new or progressive malignancy on the left. Musculoskeletal: Interval fractures of the left third and fourth ribs noted laterally on images 31-41 of series 6. The third rib fracture is displaced nearly 1 bone width as shown on image 171 series 5. Adjacent soft tissue density as shown on image 15 series 2, likely from inflammation/hematoma. CT ABDOMEN PELVIS FINDINGS Hepatobiliary: Stable hemangioma posteriorly in the right hepatic lobe. Gallbladder unremarkable. No biliary dilatation. Pancreas: Unremarkable Spleen: Punctate calcification compatible with old granulomatous disease. Adrenals/Urinary Tract: Enlarging soft tissue nodule in the right perirenal space, 1.0 by 0.9 cm on image 65 series 2, previously 0.5 by 0.6 cm, the enlargement makes this concerning for potential metastatic lesion as opposed to an incidental perinephric lymph node. Adrenal glands remain unremarkable.  The bladder appears normal. Stomach/Bowel: There is a small left posterior diaphragmatic hernia containing a small outpouching of the stomach shown on images 43-44 of series 2. Otherwise unremarkable. Vascular/Lymphatic: Atherosclerosis is present, including aortoiliac atherosclerotic disease. Reproductive: Uterus absent.  Adnexa unremarkable. Other: Soft tissue nodule along the right paracolic gutter/retroperitoneal space, 1.1 by 0.8 cm on image 76 series 2, previous precursor about 0.8 by 0.5  cm. Subtle nodularity just deep to the left transverse abdominis muscle, about 0.4 cm in diameter on image 76 series 2. This was about 2 mm in diameter previously. Small focus of remote fat necrosis anterior to the descending colon on image 74 series 2, probably benign. Musculoskeletal: Grade 1 degenerative anterolisthesis at L4-5, in conjunction with degenerative disc disease and facet arthropathy is this causes substantial central narrowing of the thecal sac at the L4-5 level. There is left foraminal stenosis at L5-S1 primarily from spurring and disc protrusion. IMPRESSION: 1. Although the pulmonary findings are relatively stable, there is an enlarging soft tissue nodule in the right perirenal space along with some increased soft tissue nodularity along the retroperitoneum/paracolic gutters bilaterally concerning for potential metastatic progression. 2. Displaced fracture of the left third rib laterally. Nondisplaced fracture of the left fourth rib laterally. These are new compared to 03/14/2022 and likely explain  the patient's regional pain. There is some adjacent soft tissue density probably from hematoma. I do not see definite underlying lesions in the ribs to suggest metastatic disease to the ribs, if the patient has radiation port was in the vicinity of the lateral third and fourth ribs then weakening the bone might of been contributory. 3. Stable appearance of scattered sub solid nodularity in the right lung, multifocal adenocarcinoma of the lung is not excluded and surveillance is suggested. 4. Mostly stable consolidative findings in the left lung likely related to prior radiation therapy. 5. Small Bochdalek hernia containing a small outpouching of the stomach. 6. Other imaging findings of potential clinical significance: Aortic Atherosclerosis (ICD10-I70.0). Right hepatic lobe hemangioma. Lumbar impingement at L4-5 and L5-S1. Electronically Signed   By: Van Clines M.D.   On: 07/14/2022 16:00   CT  Abdomen Pelvis W Contrast  Result Date: 07/14/2022 CLINICAL DATA:  Non-small cell lung cancer restaging. Pain in the left breast extending to the axilla since June 2023. * Tracking Code: BO * EXAM: CT CHEST, ABDOMEN, AND PELVIS WITH CONTRAST TECHNIQUE: Multidetector CT imaging of the chest, abdomen and pelvis was performed following the standard protocol during bolus administration of intravenous contrast. RADIATION DOSE REDUCTION: This exam was performed according to the departmental dose-optimization program which includes automated exposure control, adjustment of the mA and/or kV according to patient size and/or use of iterative reconstruction technique. CONTRAST:  11m OMNIPAQUE IOHEXOL 300 MG/ML  SOLN COMPARISON:  Multiple exams, including 03/14/2022 FINDINGS: CT CHEST FINDINGS Cardiovascular: Atherosclerotic calcification in the descending thoracic aorta. Mediastinum/Nodes: Unremarkable Lungs/Pleura: Stable appearance scattered sub solid nodularity of the right lung. Index focus of nodularity in the right lower lobe measures 1.0 by 0.7 cm on image 67 series 6, formerly 1.1 by 0.7 by my measurements. Mildly reduced thickness of the band of volume loss and consolidation in the left upper lobe on image 41 series 6. Similar inferior extension of this bandlike process in the left perihilar region as on image 67 series 6 without substantial contour change. There several other smaller bandlike regions of scarring or consolidation in the left lung. No findings of new or progressive malignancy on the left. Musculoskeletal: Interval fractures of the left third and fourth ribs noted laterally on images 31-41 of series 6. The third rib fracture is displaced nearly 1 bone width as shown on image 171 series 5. Adjacent soft tissue density as shown on image 15 series 2, likely from inflammation/hematoma. CT ABDOMEN PELVIS FINDINGS Hepatobiliary: Stable hemangioma posteriorly in the right hepatic lobe. Gallbladder  unremarkable. No biliary dilatation. Pancreas: Unremarkable Spleen: Punctate calcification compatible with old granulomatous disease. Adrenals/Urinary Tract: Enlarging soft tissue nodule in the right perirenal space, 1.0 by 0.9 cm on image 65 series 2, previously 0.5 by 0.6 cm, the enlargement makes this concerning for potential metastatic lesion as opposed to an incidental perinephric lymph node. Adrenal glands remain unremarkable.  The bladder appears normal. Stomach/Bowel: There is a small left posterior diaphragmatic hernia containing a small outpouching of the stomach shown on images 43-44 of series 2. Otherwise unremarkable. Vascular/Lymphatic: Atherosclerosis is present, including aortoiliac atherosclerotic disease. Reproductive: Uterus absent.  Adnexa unremarkable. Other: Soft tissue nodule along the right paracolic gutter/retroperitoneal space, 1.1 by 0.8 cm on image 76 series 2, previous precursor about 0.8 by 0.5 cm. Subtle nodularity just deep to the left transverse abdominis muscle, about 0.4 cm in diameter on image 76 series 2. This was about 2 mm in diameter previously. Small focus of  remote fat necrosis anterior to the descending colon on image 74 series 2, probably benign. Musculoskeletal: Grade 1 degenerative anterolisthesis at L4-5, in conjunction with degenerative disc disease and facet arthropathy is this causes substantial central narrowing of the thecal sac at the L4-5 level. There is left foraminal stenosis at L5-S1 primarily from spurring and disc protrusion. IMPRESSION: 1. Although the pulmonary findings are relatively stable, there is an enlarging soft tissue nodule in the right perirenal space along with some increased soft tissue nodularity along the retroperitoneum/paracolic gutters bilaterally concerning for potential metastatic progression. 2. Displaced fracture of the left third rib laterally. Nondisplaced fracture of the left fourth rib laterally. These are new compared to  03/14/2022 and likely explain the patient's regional pain. There is some adjacent soft tissue density probably from hematoma. I do not see definite underlying lesions in the ribs to suggest metastatic disease to the ribs, if the patient has radiation port was in the vicinity of the lateral third and fourth ribs then weakening the bone might of been contributory. 3. Stable appearance of scattered sub solid nodularity in the right lung, multifocal adenocarcinoma of the lung is not excluded and surveillance is suggested. 4. Mostly stable consolidative findings in the left lung likely related to prior radiation therapy. 5. Small Bochdalek hernia containing a small outpouching of the stomach. 6. Other imaging findings of potential clinical significance: Aortic Atherosclerosis (ICD10-I70.0). Right hepatic lobe hemangioma. Lumbar impingement at L4-5 and L5-S1. Electronically Signed   By: Van Clines M.D.   On: 07/14/2022 16:00     ASSESSMENT AND PLAN:  This is a very pleasant 76 years old white female with stage IV non-small cell lung cancer, adenocarcinoma with positive EGFR mutation with deletion in exon 19 and currently undergoing treatment with Gilotrif initially at a dose of 40 mg for 9 months followed by 30 mg by mouth daily status post 75 months.  She also underwent SBRT to the enlarging left upper lobe lung nodule. The patient continues to tolerate her treatment with Gilotrif fairly well except for mild intermittent diarrhea and rash in the scalp. She was found on imaging studies of the chest, abdomen pelvis to have suspicious nodules in the left lung as well as the retroperitoneum suspicious for disease progression. Her PET scan showed hypermetabolic subcutaneous nodule anterior to the left shoulder musculature concerning for cutaneous metastasis in addition to the enlarging nodule in the retroperitoneum anterior to the left iliacus muscle that also hypermetabolic and concerning for metastatic  deposit with enlarging hypermetabolic nodule and consolidation peripherally in the left upper lobe concerning for lung cancer recurrence. She had surgical resection of the soft tissue lesion in the retroperitoneum anterior to the left iliacus muscle and this was consistent with follicular lymphoma. The patient continues to tolerate her treatment with GILOTRIF fairly well with no concerning adverse effects. She had repeat CT scan of the chest, abdomen and pelvis performed recently. I personally and independently reviewed the scan images and discussed results with the patient today. Her scan showed no concerning findings for disease progression except for mild increase in size of soft tissue nodularity along the retroperitoneum/paracolic gutters.  She also had displaced fracture of the left third rib and nondisplaced fracture of the left fourth rib laterally that are new compared to May 2023. I recommended for the patient to continue her current treatment with GILOTRIF with the same dose. I will continue to monitor her nodularity in the retroperitoneum very closely on the upcoming imaging studies and if  it continues to increase in size, we will consider her for another PET scan and probably biopsy if needed. The patient was advised to call immediately if she has any other concerning symptoms in the interval. The patient voices understanding of current disease status and treatment options and is in agreement with the current care plan. All questions were answered. The patient knows to call the clinic with any problems, questions or concerns. We can certainly see the patient much sooner if necessary.  Disclaimer: This note was dictated with voice recognition software. Similar sounding words can inadvertently be transcribed and may not be corrected upon review.

## 2022-07-19 ENCOUNTER — Other Ambulatory Visit (HOSPITAL_COMMUNITY): Payer: Self-pay

## 2022-07-24 LAB — HM DEXA SCAN

## 2022-07-27 ENCOUNTER — Other Ambulatory Visit (HOSPITAL_COMMUNITY): Payer: Self-pay

## 2022-08-09 ENCOUNTER — Other Ambulatory Visit (HOSPITAL_COMMUNITY): Payer: Self-pay

## 2022-08-13 ENCOUNTER — Encounter: Payer: Self-pay | Admitting: Internal Medicine

## 2022-08-15 ENCOUNTER — Other Ambulatory Visit (HOSPITAL_COMMUNITY): Payer: Self-pay

## 2022-08-16 ENCOUNTER — Encounter: Payer: Self-pay | Admitting: Internal Medicine

## 2022-08-17 ENCOUNTER — Other Ambulatory Visit (HOSPITAL_COMMUNITY): Payer: Self-pay

## 2022-08-23 ENCOUNTER — Other Ambulatory Visit (HOSPITAL_COMMUNITY): Payer: Self-pay

## 2022-08-24 ENCOUNTER — Telehealth: Payer: Self-pay | Admitting: Internal Medicine

## 2022-08-24 NOTE — Telephone Encounter (Signed)
Called patient regarding upcoming November appointments. Left a voicemail.

## 2022-08-30 ENCOUNTER — Other Ambulatory Visit (HOSPITAL_COMMUNITY): Payer: Self-pay

## 2022-09-02 ENCOUNTER — Other Ambulatory Visit (HOSPITAL_COMMUNITY): Payer: Self-pay

## 2022-09-06 ENCOUNTER — Other Ambulatory Visit (HOSPITAL_COMMUNITY): Payer: Self-pay

## 2022-09-07 ENCOUNTER — Telehealth: Payer: Self-pay | Admitting: Internal Medicine

## 2022-09-07 NOTE — Telephone Encounter (Signed)
Called patient regarding rescheduled 11/28 to 11/21 per provider pal, patient has been called and notified.

## 2022-09-12 ENCOUNTER — Inpatient Hospital Stay (HOSPITAL_BASED_OUTPATIENT_CLINIC_OR_DEPARTMENT_OTHER): Payer: Medicare Other | Admitting: Internal Medicine

## 2022-09-12 ENCOUNTER — Inpatient Hospital Stay: Payer: Medicare Other | Attending: Internal Medicine

## 2022-09-12 ENCOUNTER — Other Ambulatory Visit: Payer: Self-pay

## 2022-09-12 VITALS — BP 170/79 | HR 71 | Temp 97.5°F | Resp 18 | Ht 60.0 in | Wt 171.9 lb

## 2022-09-12 DIAGNOSIS — Z90722 Acquired absence of ovaries, bilateral: Secondary | ICD-10-CM | POA: Insufficient documentation

## 2022-09-12 DIAGNOSIS — C3432 Malignant neoplasm of lower lobe, left bronchus or lung: Secondary | ICD-10-CM | POA: Insufficient documentation

## 2022-09-12 DIAGNOSIS — C3492 Malignant neoplasm of unspecified part of left bronchus or lung: Secondary | ICD-10-CM

## 2022-09-12 DIAGNOSIS — F419 Anxiety disorder, unspecified: Secondary | ICD-10-CM | POA: Diagnosis not present

## 2022-09-12 DIAGNOSIS — Z79899 Other long term (current) drug therapy: Secondary | ICD-10-CM | POA: Diagnosis not present

## 2022-09-12 DIAGNOSIS — C3490 Malignant neoplasm of unspecified part of unspecified bronchus or lung: Secondary | ICD-10-CM

## 2022-09-12 DIAGNOSIS — R21 Rash and other nonspecific skin eruption: Secondary | ICD-10-CM | POA: Diagnosis not present

## 2022-09-12 DIAGNOSIS — E039 Hypothyroidism, unspecified: Secondary | ICD-10-CM | POA: Insufficient documentation

## 2022-09-12 DIAGNOSIS — I10 Essential (primary) hypertension: Secondary | ICD-10-CM | POA: Insufficient documentation

## 2022-09-12 DIAGNOSIS — Z923 Personal history of irradiation: Secondary | ICD-10-CM | POA: Diagnosis not present

## 2022-09-12 DIAGNOSIS — R072 Precordial pain: Secondary | ICD-10-CM | POA: Diagnosis not present

## 2022-09-12 DIAGNOSIS — Z880 Allergy status to penicillin: Secondary | ICD-10-CM | POA: Diagnosis not present

## 2022-09-12 DIAGNOSIS — E785 Hyperlipidemia, unspecified: Secondary | ICD-10-CM | POA: Insufficient documentation

## 2022-09-12 LAB — CBC WITH DIFFERENTIAL (CANCER CENTER ONLY)
Abs Immature Granulocytes: 0.02 10*3/uL (ref 0.00–0.07)
Basophils Absolute: 0.1 10*3/uL (ref 0.0–0.1)
Basophils Relative: 1 %
Eosinophils Absolute: 0.3 10*3/uL (ref 0.0–0.5)
Eosinophils Relative: 4 %
HCT: 34.8 % — ABNORMAL LOW (ref 36.0–46.0)
Hemoglobin: 11.5 g/dL — ABNORMAL LOW (ref 12.0–15.0)
Immature Granulocytes: 0 %
Lymphocytes Relative: 17 %
Lymphs Abs: 1.1 10*3/uL (ref 0.7–4.0)
MCH: 29 pg (ref 26.0–34.0)
MCHC: 33 g/dL (ref 30.0–36.0)
MCV: 87.7 fL (ref 80.0–100.0)
Monocytes Absolute: 0.6 10*3/uL (ref 0.1–1.0)
Monocytes Relative: 10 %
Neutro Abs: 4.5 10*3/uL (ref 1.7–7.7)
Neutrophils Relative %: 68 %
Platelet Count: 182 10*3/uL (ref 150–400)
RBC: 3.97 MIL/uL (ref 3.87–5.11)
RDW: 12.8 % (ref 11.5–15.5)
WBC Count: 6.6 10*3/uL (ref 4.0–10.5)
nRBC: 0 % (ref 0.0–0.2)

## 2022-09-12 LAB — CMP (CANCER CENTER ONLY)
ALT: 18 U/L (ref 0–44)
AST: 20 U/L (ref 15–41)
Albumin: 4 g/dL (ref 3.5–5.0)
Alkaline Phosphatase: 68 U/L (ref 38–126)
Anion gap: 5 (ref 5–15)
BUN: 14 mg/dL (ref 8–23)
CO2: 28 mmol/L (ref 22–32)
Calcium: 9 mg/dL (ref 8.9–10.3)
Chloride: 108 mmol/L (ref 98–111)
Creatinine: 0.87 mg/dL (ref 0.44–1.00)
GFR, Estimated: >60 mL/min (ref >60)
Glucose, Bld: 84 mg/dL (ref 70–99)
Potassium: 4.1 mmol/L (ref 3.5–5.1)
Sodium: 141 mmol/L (ref 135–145)
Total Bilirubin: 0.6 mg/dL (ref 0.3–1.2)
Total Protein: 6.7 g/dL (ref 6.5–8.1)

## 2022-09-12 NOTE — Progress Notes (Signed)
[place] [phone]   [phone]  OFFICE PROGRESS NOTE  [name], MD [address] [place] [address]  DIAGNOSIS: Stage IV (T2a, N0, M1a) non-small cell lung cancer, adenocarcinoma with positive EGFR mutation with deletion in exon 19 diagnosed in June 2016 and presented with large mass in the left lower lobe in addition to multiple bilateral pulmonary nodules  PRIOR THERAPY:  1) Gilotrif 40 mg by mouth daily started 05/11/2015, status post 9 months of treatment discontinued on 02/17/2016 secondary to extensive skin rash. 2) SBRT to the enlarging left upper lobe nodule under the care of Dr. Lisbeth Renshaw.  CURRENT THERAPY: Gilotrif 30 mg by mouth daily started 02/21/2016 status post 77 months of treatment.  INTERVAL HISTORY: Elizabeth [name] 76 y.o. female returns to the clinic today for follow-up visit.  The patient is feeling fine today with no concerning complaints except for the substernal pain from the rib fractures recently.  She denied having any current shortness of breath, cough or hemoptysis.  She denied having any nausea, vomiting, diarrhea or constipation.  She has no headache or visual changes.  She denied having any significant weight loss or night sweats.  She continues to have mild skin rash.  The patient is here today for evaluation and repeat blood work.    MEDICAL HISTORY: Past Medical History:  Diagnosis Date   Adenocarcinoma of left lung, stage 4 ([place]) 04/05/2015   Biopsy confirmed CT SCAN: There are innumerable bilateral pulmonary nodules. These range in size from about 5 mm to 2 cm. They demonstrate irregular indistinct borders, the larger ones demonstrating spiculation. PET SCAN: Diffuse pulmonary metastatic disease with a 4 cm dominant left lower low lung lesion. No enlarged or hypermetabolic mediastinal or hilar adenopathy.   Allergy    Anxiety    Cancer ([place])    Change of skin related to chemotherapy  09/25/2017   Drug-induced skin rash 01/17/2016   History of radiation therapy 12/18/2019   SBRT left lung  12/11/2019-12/18/2019   Dr Gery Pray   History of radiation therapy 06/21/2021   left lung 06/14/2021-06/21/2021 Dr Gery Pray   Hyperlipidemia    Hypertension    Hypothyroidism    Insomnia    Prediabetes    Thyroid disease     ALLERGIES:  is allergic to penicillins.  MEDICATIONS:  Current Outpatient Medications  Medication Sig Dispense Refill   afatinib dimaleate (GILOTRIF) 30 MG tablet TAKE 1 TABLET (30MG) BY MOUTH ONCE DAILY. TAKE ON AN EMPTY STOMACH 1 HOUR BEFORE OR 2 HOURS AFTER A MEAL 30 tablet 2   ALPRAZolam (XANAX) 1 MG tablet Take 1 mg by mouth at bedtime.     aspirin 81 MG tablet Take 81 mg by mouth at bedtime.      ezetimibe (ZETIA) 10 MG tablet Take 1 tablet (10 mg total) by mouth daily. 90 tablet 3   ibuprofen (ADVIL,MOTRIN) 200 MG tablet Take 400 mg by mouth every 6 (six) hours as needed for headache or moderate pain.     levothyroxine (SYNTHROID) 50 MCG tablet Take  1 tablet  Daily  on an empty stomach with only water for 30 minutes & no Antacid meds, Calcium or Magnesium for 4 hours & avoid Biotin                                             /  TAKE                         BY            MOUTH 90 tablet 3   loratadine-pseudoephedrine (CLARITIN-D 12-HOUR) 5-120 MG tablet Take 1 tablet by mouth 2 (two) times daily as needed for allergies.     losartan (COZAAR) 25 MG tablet Take 1 tablet (25 mg total) by mouth daily. 30 tablet 11   melatonin 5 MG TABS Take 5 mg by mouth at bedtime.     methocarbamol (ROBAXIN) 500 MG tablet Take 500 mg by mouth 3 (three) times daily. 2-3 times a day     montelukast (SINGULAIR) 10 MG tablet Take one tablet for allergies 90 tablet 3   No current facility-administered medications for this visit.    SURGICAL HISTORY:  Past Surgical History:  Procedure Laterality Date   ABDOMINAL HYSTERECTOMY  10/23/1993   w BSO    CYST REMOVAL HAND Right    EXCISION MASS ABDOMINAL N/A 08/11/2021   Procedure: EXCISIONAL BIOPSY OF LEFT RETROPERITONEAL MASS, POSSIBLE INTRAOPERATIVE LAPAROSCOPIC ULTRASOUND;  Surgeon: Michael Boston, MD;  Location: [facility];  Service: General;  Laterality: N/A;   LAPAROSCOPY N/A 08/11/2021   Procedure: LAPAROSCOPIC EXPLORATION;  Surgeon: Michael Boston, MD;  Location: [facility];  Service: General;  Laterality: N/A;   TONSILLECTOMY AND ADENOIDECTOMY      REVIEW OF SYSTEMS:  A comprehensive review of systems was negative except for: Respiratory: positive for pleurisy/chest pain   PHYSICAL EXAMINATION: General appearance: alert, cooperative, and no distress Head: Normocephalic, without obvious abnormality, atraumatic Neck: no adenopathy, no JVD, supple, symmetrical, trachea midline, and thyroid not enlarged, symmetric, no tenderness/mass/nodules Lymph nodes: Cervical, supraclavicular, and axillary nodes normal. Resp: clear to auscultation bilaterally Back: symmetric, no curvature. ROM normal. No CVA tenderness. Cardio: regular rate and rhythm, S1, S2 normal, no murmur, click, rub or gallop GI: soft, non-tender; bowel sounds normal; no masses,  no organomegaly Extremities: extremities normal, atraumatic, no cyanosis or edema  ECOG PERFORMANCE STATUS: 1 - Symptomatic but completely ambulatory  Blood pressure (!) 170/79, pulse 71, temperature (!) 97.5 F (36.4 C), temperature source Oral, resp. rate 18, height 5' (1.524 m), weight 171 lb 14.4 oz (78 kg), SpO2 96 %.  LABORATORY DATA: Lab Results  Component Value Date   WBC 6.6 09/12/2022   HGB 11.5 (L) 09/12/2022   HCT 34.8 (L) 09/12/2022   MCV 87.7 09/12/2022   PLT 182 09/12/2022      Chemistry      Component Value Date/Time   NA 142 07/13/2022 1325   NA 139 10/26/2017 1252   K 4.1 07/13/2022 1325   K 4.1 10/26/2017 1252   CL 108 07/13/2022 1325   CO2 29 07/13/2022 1325   CO2 28 10/26/2017 1252   BUN 14 07/13/2022 1325   BUN  15.7 10/26/2017 1252   CREATININE 0.80 07/13/2022 1325   CREATININE 0.91 05/18/2022 1156   CREATININE 1.0 10/26/2017 1252      Component Value Date/Time   CALCIUM 8.5 (L) 07/13/2022 1325   CALCIUM 8.8 10/26/2017 1252   ALKPHOS 61 07/13/2022 1325   ALKPHOS 70 10/26/2017 1252   AST 21 07/13/2022 1325   AST 19 10/26/2017 1252   ALT 14 07/13/2022 1325   ALT 22 10/26/2017 1252   BILITOT 0.8 07/13/2022 1325   BILITOT 0.90 10/26/2017 1252       RADIOGRAPHIC STUDIES: No results found.   ASSESSMENT  AND PLAN:  This is a very pleasant 76 years old white female with stage IV non-small cell lung cancer, adenocarcinoma with positive EGFR mutation with deletion in exon 19 and currently undergoing treatment with Gilotrif initially at a dose of 40 mg for 9 months followed by 30 mg by mouth daily status post 77 months.  She also underwent SBRT to the enlarging left upper lobe lung nodule. Her PET scan showed hypermetabolic subcutaneous nodule anterior to the left shoulder musculature concerning for cutaneous metastasis in addition to the enlarging nodule in the retroperitoneum anterior to the left iliacus muscle that also hypermetabolic and concerning for metastatic deposit with enlarging hypermetabolic nodule and consolidation peripherally in the left upper lobe concerning for lung cancer recurrence. She had surgical resection of the soft tissue lesion in the retroperitoneum anterior to the left iliacus muscle and this was consistent with follicular lymphoma. The patient continues to tolerate her treatment with GILOTRIF fairly well. I recommended for her to continue her current treatment with the same dose 30 mg p.o. daily. I will see her back for follow-up visit in 2 months for evaluation with repeat CT scan of the chest, abdomen and pelvis for restaging of her disease. The patient was advised to call immediately if she has any other concerning symptoms in the interval. The patient voices  understanding of current disease status and treatment options and is in agreement with the current care plan. All questions were answered. The patient knows to call the clinic with any problems, questions or concerns. We can certainly see the patient much sooner if necessary.  Disclaimer: This note was dictated with voice recognition software. Similar sounding words can inadvertently be transcribed and may not be corrected upon review.

## 2022-09-19 ENCOUNTER — Other Ambulatory Visit: Payer: Medicare Other

## 2022-09-19 ENCOUNTER — Ambulatory Visit: Payer: Medicare Other | Admitting: Internal Medicine

## 2022-09-19 ENCOUNTER — Other Ambulatory Visit (HOSPITAL_COMMUNITY): Payer: Self-pay

## 2022-09-19 NOTE — Progress Notes (Unsigned)
3 MONTH FOLLOW UP  Assessment and Plan:   Atherosclerosis of aorta ([place]) 10/2019 Control blood pressure, cholesterol, glucose, increase exercise.  Discussed LDL goal <100  Essential hypertension Increase Losartan to 50 mg QD Monitor blood pressure at home; call if consistently over 130/80 Continue DASH diet.   Reminder to go to the ER if any CP, SOB, nausea, dizziness, severe HA, changes vision/speech, left arm numbness and tingling and jaw pain. -     CBC with Differential/Platelet -     COMPLETE METABOLIC PANEL WITH GFR  Hyperlipidemia, mixed Has been off of statin secondary to cancer/chemo Continue on Zetia Discussed dietary and exercise modifications Low fat diet -     Lipid panel  Acquired hypothyroidism Taking levothyroxine 50 mcg daily Reminder to take on an empty stomach 30-43mins before first meal of the day. No antacid medications for 4 hours. -     TSH  Adenocarcinoma of left lung, stage 4 ([place]) Continue follow up with Oncology,Dr Earlie Server Continue current treatment with GILOTRIF   Vitamin D deficiency Continue supplementation to maintain goal of 60-100 Taking Vitamin D 10,000 IU three times a week.  Abnormal glucose Discussed dietary and exercise modifications  Anxiety Well managed by current regimen; continue medications Stress management techniques discussed, increase water, good sleep hygiene discussed, increase exercise, and increase veggies.   Insomnia, unspecified type Discussed good sleep hygiene, decrease stimulation prior to sleep Increase day time activity Avoid caffeine in evenings Uses alprazolam PRN after failing trazodone, gabapentin; understands risks associated with this med and wishes to continue Getting through Dr. Simonne Maffucci -   Obesity (BMI 30-34.9) Discussed dietary and exercise modifications Weight loss encouraged  Medication management Continued  Closed fracture of multiple ribs of left side with delayed healing Has been  seen by orthopedics Is left handed and does most things with left hand Have suggested a compression bra with additional support strap across the top- wear for 16 hours a day at most If no relief and pain persists would recommend reevaluation at ortho     Over 6min of face to face interview, exam, chart review, counseling preformed this office visit with moderate decision making.  Discussed med's effects and SE's. Screening labs and tests as requested with regular follow-up as recommended. Future Appointments  Date Time Provider [place]  11/09/2022 10:00 AM [facility] None  11/13/2022  3:45 PM Curt Bears, MD [facility] None  12/05/2022  2:00 PM [name], MD [facility] None  05/21/2023 11:00 AM Alycia Rossetti, NP [facility] None    HPI  76 y.o. female  presents for 3 month follow up on weight, lipids, vitamin D.    She has stage 4 lung cancer, on Glotinef maintenance therapy and following with Dr. Julien Nordmann. Recent PET scan showed recurrent tumor with concern for mets. She had biopsy of nodule and referred to Dr. Sondra Come for consideration of SBRT to these 3 suspicious nodules, and recently on 08/11/2021 underwent exploratory lap with resection of pelvic nodule by Dr. Johney Maine, with path showing Non-Hodgkin B-cell lymphoma, consistent with follicular lymphoma. Continues to tolerate treatment with GILOTRIF and is to continue current treatment and follow up in 2 months with repeat CT of chest, abdomen, and pelvis for restaging of disease.   She continues to have pain in her left side of chest and has 3rd and 4th rib fracture which was displaced 05/2022, recent xray 08/24/22 shows continued fractures of left 3rd, 4th ribs.   She has anxiety/insomnia, currently prescribed xanax 1  mg by another office (Dr. Kate Sable), tried gabapentin, trazodone without success.   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   BMI is Body mass index is 33.55 kg/m., she has been working on diet and exercise, going to chiropractor to help with back  Wt Readings from Last 3 Encounters:  09/20/22 171 lb 12.8 oz (77.9 kg)  09/12/22 171 lb 14.4 oz (78 kg)  06/19/22 171 lb 9.6 oz (77.8 kg)    She is not checking BP regularly, today their BP is BP: (!) 146/82.  BP Readings from Last 3 Encounters:  09/20/22 (!) 146/82  09/12/22 (!) 170/79  07/17/22 (!) 160/82  She does not workout. She denies chest pain, dizziness. She has exertional dyspnea, unchanged. Improved with dry weather. Denies wheezing/coughing.    She has aortic atherosclerosis per CT 10/2019.   She is not on cholesterol medication (stopped statin several years back with cancer diagnosis, newly on zetia) and denies myalgias. Her cholesterol is not at goal. The cholesterol last visit was:  Lab Results  Component Value Date   CHOL 168 05/18/2022   HDL 52 05/18/2022   LDLCALC 100 (H) 05/18/2022   TRIG 75 05/18/2022   CHOLHDL 3.2 05/18/2022    Last A1C in the office was:  Lab Results  Component Value Date   HGBA1C 5.5 05/18/2022   Patient is on Vitamin D supplement.   Lab Results  Component Value Date   VD25OH 58 11/29/2021     She is on thyroid medication. Her medication was not changed last visit.She is not on biotin. Takes 50 mcg daily.  Lab Results  Component Value Date   TSH 1.65 05/18/2022    Current Medications:  Current Outpatient Medications on File Prior to Visit  Medication Sig Dispense Refill   afatinib dimaleate (GILOTRIF) 30 MG tablet TAKE 1 TABLET (30MG ) BY MOUTH ONCE DAILY. TAKE ON AN EMPTY STOMACH 1 HOUR BEFORE OR 2 HOURS AFTER A MEAL 30 tablet 2   ALPRAZolam (XANAX) 1 MG tablet Take 1 mg by mouth at bedtime.     aspirin 81 MG tablet Take 81 mg by mouth at bedtime.      baclofen (LIORESAL) 10 MG tablet Take by mouth.     ezetimibe (ZETIA) 10 MG tablet Take  1 tablet (10 mg total) by mouth daily. 90 tablet 3   ibuprofen (ADVIL,MOTRIN) 200 MG tablet Take 400 mg by mouth every 6 (six) hours as needed for headache or moderate pain.     levothyroxine (SYNTHROID) 50 MCG tablet Take  1 tablet  Daily  on an empty stomach with only water for 30 minutes & no Antacid meds, Calcium or Magnesium for 4 hours & avoid Biotin                                             /                       TAKE                         BY            MOUTH 90 tablet 3   loratadine-pseudoephedrine (CLARITIN-D 12-HOUR) 5-120 MG tablet Take  1 tablet by mouth 2 (two) times daily as needed for allergies.     losartan (COZAAR) 25 MG tablet Take 1 tablet (25 mg total) by mouth daily. 30 tablet 11   Magnesium 250 MG TABS Take by mouth.     melatonin 5 MG TABS Take 5 mg by mouth at bedtime.     montelukast (SINGULAIR) 10 MG tablet Take one tablet for allergies 90 tablet 3   methocarbamol (ROBAXIN) 500 MG tablet Take 500 mg by mouth 3 (three) times daily. 2-3 times a day     No current facility-administered medications on file prior to visit.     Medical History:  Past Medical History:  Diagnosis Date   Adenocarcinoma of left lung, stage 4 ([place]) 04/05/2015   Biopsy confirmed CT SCAN: There are innumerable bilateral pulmonary nodules. These range in size from about 5 mm to 2 cm. They demonstrate irregular indistinct borders, the larger ones demonstrating spiculation. PET SCAN: Diffuse pulmonary metastatic disease with a 4 cm dominant left lower low lung lesion. No enlarged or hypermetabolic mediastinal or hilar adenopathy.   Allergy    Anxiety    Cancer ([place])    Change of skin related to chemotherapy 09/25/2017   Drug-induced skin rash 01/17/2016   History of radiation therapy 12/18/2019   SBRT left lung  12/11/2019-12/18/2019   Dr Gery Pray   History of radiation therapy 06/21/2021   left lung 06/14/2021-06/21/2021 Dr Gery Pray   Hyperlipidemia    Hypertension    Hypothyroidism     Insomnia    Prediabetes    Thyroid disease    Allergies Allergies  Allergen Reactions   Penicillins Swelling and Rash   SURGICAL HISTORY She  has a past surgical history that includes Tonsillectomy and adenoidectomy; Abdominal hysterectomy (10/23/1993); Cyst removal hand (Right); laparoscopy (N/A, 08/11/2021); and Excision mass abdominal (N/A, 08/11/2021). FAMILY HISTORY Her family history includes ALS in her father; Alcohol abuse in her mother and paternal grandfather; Bone cancer in her maternal uncle; Breast cancer in her cousin; Dementia (age of onset: 37) in her paternal aunt; Dementia (age of onset: 38) in her maternal grandmother; Heart disease in her paternal grandfather; Hypertension in her father; Liver disease in her mother; Lung cancer (age of onset: 18) in her paternal grandmother; Lung disease in her maternal grandfather; Melanoma in her paternal aunt. SOCIAL HISTORY She  reports that she quit smoking about 47 years ago. Her smoking use included cigarettes. She has a 5.00 pack-year smoking history. She has never used smokeless tobacco. She reports that she does not drink alcohol and does not use drugs.    Review of Systems: Review of Systems  Constitutional:  Negative for chills, fever and malaise/fatigue.  HENT:  Negative for congestion, ear pain and sore throat.   Eyes: Negative.   Respiratory:  Positive for shortness of breath (Chronic since lung cancer). Negative for cough and wheezing.   Cardiovascular:  Negative for chest pain, palpitations and leg swelling.  Gastrointestinal:  Negative for abdominal pain, blood in stool, constipation, diarrhea, heartburn and melena.  Genitourinary: Negative.   Musculoskeletal:        Rib pain on left  Skin:  Negative for itching and rash.  Neurological:  Negative for dizziness, sensory change, loss of consciousness and headaches.  Psychiatric/Behavioral:  Negative for depression, memory loss, substance abuse and suicidal ideas.  The patient has insomnia (managing well with meds). The patient is not nervous/anxious.     Physical Exam: Estimated body mass index  is 33.55 kg/m as calculated from the following:   Height as of this encounter: 5' (1.524 m).   Weight as of this encounter: 171 lb 12.8 oz (77.9 kg). BP (!) 146/82   Pulse 83   Temp (!) 97.5 F (36.4 C)   Ht 5' (1.524 m)   Wt 171 lb 12.8 oz (77.9 kg)   SpO2 95%   BMI 33.55 kg/m   General Appearance: Well nourished well developed, in no apparent distress.  Eyes: PERRLA, EOMs, conjunctiva no swelling or erythema ENT/Mouth: Ear canals normal without obstruction, swelling, erythema, or discharge.  TMs normal bilaterally with no erythema, bulging, retraction, or loss of landmark.  Oropharynx moist and clear with no exudate, erythema, or swelling.  Frontal and maxillary tenderness noted. Neck: Supple, thyroid normal. No bruits.  No cervical adenopathy Respiratory: Respiratory effort normal, Breath sounds without rales.  Bilateral wheezing and rhonchi noted. Cardio: RRR without murmurs, rubs or gallops. Brisk peripheral pulses without edema.  retractions.  Abdomen: Soft, nontender, no guarding, rebound, hernias, masses, or organomegaly. Well healing lap scars x 4 seen without concerning erythema or disharge.  Lymphatics: Non tender without lymphadenopathy.  Musculoskeletal: Full ROM all peripheral extremities,5/5 strength, and normal gait.  Tenderness of 3rd and 4th left rib area adjacent to sternum Skin:  Non-tender, warm and dry.  Neuro: Awake and oriented X 3, Cranial nerves intact, reflexes equal bilaterally. Normal muscle tone, no cerebellar symptoms. Sensation intact.  Psych:  normal affect, Insight and Judgment appropriate.    Alycia Rossetti, NP 12:58 PM [facility]

## 2022-09-20 ENCOUNTER — Encounter: Payer: Self-pay | Admitting: Nurse Practitioner

## 2022-09-20 ENCOUNTER — Ambulatory Visit (INDEPENDENT_AMBULATORY_CARE_PROVIDER_SITE_OTHER): Payer: Medicare Other | Admitting: Nurse Practitioner

## 2022-09-20 VITALS — BP 168/84 | HR 83 | Temp 97.5°F | Ht 60.0 in | Wt 171.8 lb

## 2022-09-20 DIAGNOSIS — I10 Essential (primary) hypertension: Secondary | ICD-10-CM

## 2022-09-20 DIAGNOSIS — G4700 Insomnia, unspecified: Secondary | ICD-10-CM

## 2022-09-20 DIAGNOSIS — E782 Mixed hyperlipidemia: Secondary | ICD-10-CM | POA: Diagnosis not present

## 2022-09-20 DIAGNOSIS — C3492 Malignant neoplasm of unspecified part of left bronchus or lung: Secondary | ICD-10-CM

## 2022-09-20 DIAGNOSIS — E559 Vitamin D deficiency, unspecified: Secondary | ICD-10-CM

## 2022-09-20 DIAGNOSIS — S2242XG Multiple fractures of ribs, left side, subsequent encounter for fracture with delayed healing: Secondary | ICD-10-CM

## 2022-09-20 DIAGNOSIS — I700 Atherosclerosis of aorta: Secondary | ICD-10-CM

## 2022-09-20 DIAGNOSIS — R7309 Other abnormal glucose: Secondary | ICD-10-CM

## 2022-09-20 DIAGNOSIS — E039 Hypothyroidism, unspecified: Secondary | ICD-10-CM

## 2022-09-20 DIAGNOSIS — Z79899 Other long term (current) drug therapy: Secondary | ICD-10-CM

## 2022-09-20 DIAGNOSIS — E669 Obesity, unspecified: Secondary | ICD-10-CM

## 2022-09-20 DIAGNOSIS — F419 Anxiety disorder, unspecified: Secondary | ICD-10-CM

## 2022-09-20 MED ORDER — LOSARTAN POTASSIUM 50 MG PO TABS: 50.0000 mg | ORAL_TABLET | Freq: Every day | ORAL | 11 refills | Status: DC

## 2022-09-20 NOTE — Patient Instructions (Signed)
[email or web address]  Compression bra with hooks in front and support across the top of rib cage.   Rib Fracture  A rib fracture is a break or crack in one of the bones of the ribs. The ribs are long, curved bones that wrap around your chest and attach to your spine and your breastbone. The ribs protect your heart, lungs, and other organs in the chest. A broken or cracked rib is often painful but is not usually serious. Most rib fractures heal on their own over time. However, rib fractures can be more serious if multiple ribs are broken or if broken ribs move out of place and push against other structures or organs. What are the causes? This condition is caused by: Repetitive movements with high force, such as pitching a baseball or having very bad coughing spells. A direct hit the chest, such as a sports injury, a car crash, or a fall. Cancer that has spread to the bones, which can weaken bones and cause them to break. What are the signs or symptoms? Symptoms of this condition include: Pain when you breathe in or cough. Pain when someone presses on the injured area. Feeling short of breath. How is this diagnosed? This condition is diagnosed with a physical exam and medical history. You may also have imaging tests, such as: Chest X-ray. CT scan. MRI. Bone scan. Chest ultrasound. How is this treated? Treatment for this condition depends on the severity of the fracture. Most rib fractures usually heal on their own in 1-3 months. Healing may take longer if you have a cough or if you do activities that make the injury worse. While you heal, you may be given medicines to control the pain. You will also be taught deep breathing exercises. Severe injuries may require hospitalization or surgery. Follow these instructions at home: Managing pain, stiffness, and swelling If directed, put ice on the injured area. To do this: Put ice in a plastic  bag. Place a towel between your skin and the bag. Leave the ice on for 20 minutes, 2-3 times a day. Remove the ice if your skin turns bright red. This is very important. If you cannot feel pain, heat, or cold, you have a greater risk of damage to the area. Take over-the-counter and prescription medicines only as told by your health care provider. Activity Avoid doing activities or movements that cause pain. Be careful during activities and avoid bumping the injured rib. Slowly increase your activity as told by your health care provider. General instructions Do deep breathing exercises as told by your health care provider. This helps prevent pneumonia, which is a common complication of a broken rib. Your health care provider may instruct you to: Take deep breaths several times a day. Try to cough several times a day, holding a pillow against the injured area. Use a device called incentive spirometer to practice deep breathing several times a day. Drink enough fluid to keep your urine pale yellow. Do not wear a rib belt or binder. These restrict breathing, which can lead to pneumonia. Keep all follow-up visits. This is important. Contact a health care provider if: You have a fever. Get help right away if: You have difficulty breathing or you are short of breath. You develop a cough that does not stop, or you cough up thick or bloody sputum. You have nausea, vomiting, or pain in your abdomen. Your pain gets worse and medicine does not help. These symptoms may represent a serious problem that is an  emergency. Do not wait to see if the symptoms will go away. Get medical help right away. Call your local emergency services (911 in the [place] Do not drive yourself to the hospital. Summary A rib fracture is a break or crack in one of the bones of the ribs. A broken or cracked rib is often painful but is not usually serious. Most rib fractures heal on their own over time. Treatment for this  condition depends on the severity of the fracture. Avoid doing any activities or movements that cause pain. This information is not intended to replace advice given to you by your health care provider. Make sure you discuss any questions you have with your health care provider. Document Revised: 01/30/2020 Document Reviewed: 01/30/2020 Elsevier Patient Education  [place].

## 2022-09-21 LAB — CBC WITH DIFFERENTIAL/PLATELET
Absolute Monocytes: 644 cells/uL (ref 200–950)
Basophils Absolute: 49 cells/uL (ref 0–200)
Basophils Relative: 0.7 %
Eosinophils Absolute: 224 cells/uL (ref 15–500)
Eosinophils Relative: 3.2 %
HCT: 36.8 % (ref 35.0–45.0)
Hemoglobin: 12 g/dL (ref 11.7–15.5)
Lymphs Abs: 1085 cells/uL (ref 850–3900)
MCH: 28.4 pg (ref 27.0–33.0)
MCHC: 32.6 g/dL (ref 32.0–36.0)
MCV: 87.2 fL (ref 80.0–100.0)
MPV: 11 fL (ref 7.5–12.5)
Monocytes Relative: 9.2 %
Neutro Abs: 4998 cells/uL (ref 1500–7800)
Neutrophils Relative %: 71.4 %
Platelets: 218 10*3/uL (ref 140–400)
RBC: 4.22 10*6/uL (ref 3.80–5.10)
RDW: 12.8 % (ref 11.0–15.0)
Total Lymphocyte: 15.5 %
WBC: 7 10*3/uL (ref 3.8–10.8)

## 2022-09-21 LAB — COMPLETE METABOLIC PANEL WITH GFR
AG Ratio: 1.5 (calc) (ref 1.0–2.5)
ALT: 15 U/L (ref 6–29)
AST: 20 U/L (ref 10–35)
Albumin: 4 g/dL (ref 3.6–5.1)
Alkaline phosphatase (APISO): 67 U/L (ref 37–153)
BUN: 17 mg/dL (ref 7–25)
CO2: 28 mmol/L (ref 20–32)
Calcium: 9 mg/dL (ref 8.6–10.4)
Chloride: 106 mmol/L (ref 98–110)
Creat: 0.84 mg/dL (ref 0.60–1.00)
Globulin: 2.7 g/dL (calc) (ref 1.9–3.7)
Glucose, Bld: 69 mg/dL (ref 65–99)
Potassium: 4.5 mmol/L (ref 3.5–5.3)
Sodium: 142 mmol/L (ref 135–146)
Total Bilirubin: 0.9 mg/dL (ref 0.2–1.2)
Total Protein: 6.7 g/dL (ref 6.1–8.1)
eGFR: 72 mL/min/{1.73_m2} (ref >=60)

## 2022-09-21 LAB — LIPID PANEL
Cholesterol: 165 mg/dL (ref <200)
HDL: 47 mg/dL — ABNORMAL LOW (ref >=50)
LDL Cholesterol (Calc): 101 mg/dL (calc) — ABNORMAL HIGH
Non-HDL Cholesterol (Calc): 118 mg/dL (calc) (ref <130)
Total CHOL/HDL Ratio: 3.5 (calc) (ref <5.0)
Triglycerides: 84 mg/dL (ref <150)

## 2022-09-21 LAB — TSH: TSH: 3.43 mIU/L (ref 0.40–4.50)

## 2022-10-05 DIAGNOSIS — S2242XA Multiple fractures of ribs, left side, initial encounter for closed fracture: Secondary | ICD-10-CM | POA: Insufficient documentation

## 2022-10-09 ENCOUNTER — Other Ambulatory Visit: Payer: Self-pay

## 2022-10-09 ENCOUNTER — Other Ambulatory Visit: Payer: Self-pay | Admitting: Internal Medicine

## 2022-10-09 ENCOUNTER — Other Ambulatory Visit (HOSPITAL_COMMUNITY): Payer: Self-pay

## 2022-10-09 MED ORDER — AFATINIB DIMALEATE 30 MG PO TABS
ORAL_TABLET | ORAL | 2 refills | Status: DC
  Filled 2022-10-09: qty 30, 30d supply, fill #0
  Filled 2022-11-02: qty 30, 30d supply, fill #1
  Filled 2022-12-05: qty 30, 30d supply, fill #2

## 2022-10-11 ENCOUNTER — Other Ambulatory Visit: Payer: Self-pay

## 2022-10-11 ENCOUNTER — Other Ambulatory Visit (HOSPITAL_COMMUNITY): Payer: Self-pay

## 2022-10-12 ENCOUNTER — Other Ambulatory Visit (HOSPITAL_COMMUNITY): Payer: Self-pay

## 2022-10-16 ENCOUNTER — Other Ambulatory Visit: Payer: Self-pay

## 2022-10-16 ENCOUNTER — Encounter (HOSPITAL_BASED_OUTPATIENT_CLINIC_OR_DEPARTMENT_OTHER): Payer: Self-pay | Admitting: Emergency Medicine

## 2022-10-16 ENCOUNTER — Emergency Department (HOSPITAL_BASED_OUTPATIENT_CLINIC_OR_DEPARTMENT_OTHER)
Admission: EM | Admit: 2022-10-16 | Discharge: 2022-10-16 | Disposition: A | Discharge status: Home / Self Care | Payer: Medicare Other | Attending: Emergency Medicine | Admitting: Emergency Medicine

## 2022-10-16 ENCOUNTER — Emergency Department (HOSPITAL_BASED_OUTPATIENT_CLINIC_OR_DEPARTMENT_OTHER): Payer: Medicare Other

## 2022-10-16 DIAGNOSIS — Z7982 Long term (current) use of aspirin: Secondary | ICD-10-CM | POA: Insufficient documentation

## 2022-10-16 DIAGNOSIS — S2242XA Multiple fractures of ribs, left side, initial encounter for closed fracture: Secondary | ICD-10-CM

## 2022-10-16 DIAGNOSIS — R0781 Pleurodynia: Secondary | ICD-10-CM | POA: Insufficient documentation

## 2022-10-16 DIAGNOSIS — Z79899 Other long term (current) drug therapy: Secondary | ICD-10-CM | POA: Insufficient documentation

## 2022-10-16 LAB — CBC WITH DIFFERENTIAL/PLATELET
Abs Immature Granulocytes: 0.02 10*3/uL (ref 0.00–0.07)
Basophils Absolute: 0 10*3/uL (ref 0.0–0.1)
Basophils Relative: 1 %
Eosinophils Absolute: 0.3 10*3/uL (ref 0.0–0.5)
Eosinophils Relative: 4 %
HCT: 38.6 % (ref 36.0–46.0)
Hemoglobin: 12.4 g/dL (ref 12.0–15.0)
Immature Granulocytes: 0 %
Lymphocytes Relative: 21 %
Lymphs Abs: 1.4 10*3/uL (ref 0.7–4.0)
MCH: 27.7 pg (ref 26.0–34.0)
MCHC: 32.1 g/dL (ref 30.0–36.0)
MCV: 86.2 fL (ref 80.0–100.0)
Monocytes Absolute: 0.6 10*3/uL (ref 0.1–1.0)
Monocytes Relative: 9 %
Neutro Abs: 4.3 10*3/uL (ref 1.7–7.7)
Neutrophils Relative %: 65 %
Platelets: 215 10*3/uL (ref 150–400)
RBC: 4.48 MIL/uL (ref 3.87–5.11)
RDW: 12.7 % (ref 11.5–15.5)
WBC: 6.6 10*3/uL (ref 4.0–10.5)
nRBC: 0 % (ref 0.0–0.2)

## 2022-10-16 LAB — BASIC METABOLIC PANEL
Anion gap: 12 (ref 5–15)
BUN: 17 mg/dL (ref 8–23)
CO2: 25 mmol/L (ref 22–32)
Calcium: 9.3 mg/dL (ref 8.9–10.3)
Chloride: 103 mmol/L (ref 98–111)
Creatinine, Ser: 0.92 mg/dL (ref 0.44–1.00)
GFR, Estimated: >60 mL/min (ref >60)
Glucose, Bld: 100 mg/dL — ABNORMAL HIGH (ref 70–99)
Potassium: 4.1 mmol/L (ref 3.5–5.1)
Sodium: 140 mmol/L (ref 135–145)

## 2022-10-16 MED ORDER — IOHEXOL 300 MG/ML  SOLN
100.0000 mL | Freq: Once | INTRAMUSCULAR | Status: AC | PRN
  Administered 2022-10-16: 100 mL via INTRAVENOUS

## 2022-10-16 MED ORDER — OXYCODONE-ACETAMINOPHEN 5-325 MG PO TABS: 1.0000 | ORAL_TABLET | Freq: Four times a day (QID) | ORAL | 0 refills | Status: DC | PRN

## 2022-10-16 MED ORDER — LIDOCAINE 5 % EX PTCH: 1.0000 | MEDICATED_PATCH | CUTANEOUS | 0 refills | Status: DC

## 2022-10-16 MED ORDER — KETOROLAC TROMETHAMINE 30 MG/ML IJ SOLN
30.0000 mg | Freq: Once | INTRAMUSCULAR | Status: AC
  Administered 2022-10-16: 30 mg via INTRAVENOUS
  Filled 2022-10-16: qty 1

## 2022-10-16 MED ORDER — LIDOCAINE 5 % EX PTCH
1.0000 | MEDICATED_PATCH | CUTANEOUS | Status: DC
  Administered 2022-10-16: 1 via TRANSDERMAL
  Filled 2022-10-16: qty 1

## 2022-10-16 MED ORDER — OXYCODONE-ACETAMINOPHEN 5-325 MG PO TABS
1.0000 | ORAL_TABLET | Freq: Once | ORAL | Status: AC
  Administered 2022-10-16: 1 via ORAL
  Filled 2022-10-16: qty 1

## 2022-10-16 MED ORDER — FENTANYL CITRATE PF 50 MCG/ML IJ SOSY
50.0000 ug | PREFILLED_SYRINGE | Freq: Once | INTRAMUSCULAR | Status: AC
  Administered 2022-10-16: 50 ug via INTRAVENOUS
  Filled 2022-10-16: qty 1

## 2022-10-16 NOTE — Discharge Instructions (Addendum)
Discharge Instructions for Rib Fractures  A rib fracture is a crack or break in a rib bone.  Your CT scan showed today unhealed rib fractures of the left third and fourth rib fracture.  We have prescribed you Lidoderm patches and Percocet to take with your other home pain medications for pain control.  Continue to use incentive spirometer every 4 hours while awake.  I have put in information for our thoracic surgeon Dr. Tenny Craw who you can call to make an appointment due to unhealed rib fractures.  Medicines: NSAIDs: These medicines decrease swelling and pain. NSAIDs are available without a doctor's order. Ask which medicine is right for you. Ask how much to take and when to take it. Take as directed. NSAIDs can cause stomach bleeding and kidney problems if not taken correctly.  Acetaminophen: This medicine decreases pain. You can buy acetaminophen without a doctor's order. Ask how much to take and how often to take it. Follow directions. Acetaminophen can cause liver damage if not taken correctly.  Pain medicines: You may be given a prescription medicine to decrease pain. Do not wait until the pain is severe before you take this medicine.  Take your medicine as directed. Call your healthcare provider if you think your medicine is not helping or if you have side effects. Tell him if you are allergic to any medicine. Keep a list of the medicines, vitamins, and herbs you take. Include the amounts, and when and why you take them. Bring the list or the pill bottles to follow-up visits. Carry your medicine list with you in case of an emergency.  Deep breathing: To help prevent pneumonia, take 10 deep breaths every hour, even when you wake up during the night. Brace your ribs with your hands or a pillow while you take the deep breaths to help decrease the pain.  You may need to use an incentive spirometer to help you take deeper breaths. Put the plastic piece into your mouth and take a very deep breath. Hold  your breath as long as you can. Then let out your breath. Do this 10 times in a row every hour while you are awake.  Rest: Rest your ribs to decrease swelling and allow the injury to heal faster. This may take up to 6 weeks. Avoid activities that may cause more pain or damage to your ribs. As your pain decreases, begin movements slowly.  Ice: Ice helps decrease swelling and pain. Ice may also help prevent tissue damage. Use an ice pack or put crushed ice in a plastic bag. Cover it with a towel and place it on your fractured area for 15 to 20 minutes every hour as directed.  Contact your primary healthcare provider if: You have a fever. You have bruising on your chest. You get a cold and are coughing a lot. You have questions or concerns about your condition or care.  Seek care immediately or call 911 if: You suddenly have more severe chest pain. You may cough up blood. You have a fever or chills and increased shortness of breath. You have abdominal pain. You suddenly feel lightheaded and short of breath. Your arm or leg feels warm, tender, and painful. It may look swollen and red.

## 2022-10-16 NOTE — ED Notes (Signed)
Patient instructed on IS. Patient demonstrated good effort.

## 2022-10-16 NOTE — ED Triage Notes (Signed)
Patient presents C/O L sided rib cage pain that awoke her from sleep. States she has displaced fx of L 3rd rib and nondiplaced fx of L 4th rib, dx in September. States the pain has been ongoing for months and has not improved. Took Hydrocodone, Baclofen and Xanax 0030 with no relief.

## 2022-10-16 NOTE — ED Provider Notes (Signed)
[facility] EMERGENCY DEPT  Provider Note  CSN: [id number] Arrival date & time: 10/16/22 0500  History Chief Complaint  Patient presents with   Rib Injury    Elizabeth [name] is a 76 y.o. female with history of metastatic lung cancer, on long term oral chemo with stable response was seen by Oncology in Sept for routine surveillance CT and found to have two rib fractures on the left 3rd and 4th ribs of unclear etiology. She had been having pain in that area for about 3 months at that time. She has continued to have pain since that time, typically well controlled with her home meds, but during the night tonight her pain became more severe to the point she could not sleep. No fever, cough. She feels like something is poking her.    Home Medications Prior to Admission medications   Medication Sig Start Date End Date Taking? Authorizing Provider  afatinib dimaleate (GILOTRIF) 30 MG tablet TAKE 1 TABLET (30MG ) BY MOUTH ONCE DAILY. TAKE ON AN EMPTY STOMACH 1 HOUR BEFORE OR 2 HOURS AFTER A MEAL 10/09/22 10/09/23  Curt Bears, MD  ALPRAZolam Duanne Moron) 1 MG tablet Take 1 mg by mouth at bedtime.    [provider]  aspirin 81 MG tablet Take 81 mg by mouth at bedtime.     [provider]  ezetimibe (ZETIA) 10 MG tablet Take 1 tablet (10 mg total) by mouth daily. 05/24/22   Alycia Rossetti, NP  ibuprofen (ADVIL,MOTRIN) 200 MG tablet Take 400 mg by mouth every 6 (six) hours as needed for headache or moderate pain.    [provider]  levothyroxine (SYNTHROID) 50 MCG tablet Take  1 tablet  Daily  on an empty stomach with only water for 30 minutes & no Antacid meds, Calcium or Magnesium for 4 hours & avoid Biotin                                             /                       TAKE                         BY            MOUTH 12/25/21   [name], MD  loratadine-pseudoephedrine (CLARITIN-D 12-HOUR) 5-120 MG tablet Take 1 tablet by mouth 2 (two) times daily  as needed for allergies.    [provider]  losartan (COZAAR) 50 MG tablet Take 1 tablet (50 mg total) by mouth daily. 09/20/22 09/20/23  Alycia Rossetti, NP  Magnesium 250 MG TABS Take by mouth.    [provider]  melatonin 5 MG TABS Take 5 mg by mouth at bedtime.    [provider]  montelukast (SINGULAIR) 10 MG tablet Take one tablet for allergies 06/23/22   Darrol Jump, NP     Allergies    Penicillins   Review of Systems   Review of Systems Please see HPI for pertinent positives and negatives  Physical Exam BP (!) 153/79 (BP Location: Right Arm)   Pulse 88   Temp 98 F (36.7 C) (Oral)   Resp 18   Ht 5' (1.524 m)   Wt 74.8 kg   SpO2 97%   BMI 32.22 kg/m  Physical Exam Vitals and nursing note reviewed.  Constitutional:      Appearance: Normal appearance.  HENT:     Head: Normocephalic and atraumatic.     Nose: Nose normal.     Mouth/Throat:     Mouth: Mucous membranes are moist.  Eyes:     Extraocular Movements: Extraocular movements intact.     Conjunctiva/sclera: Conjunctivae normal.  Cardiovascular:     Rate and Rhythm: Normal rate.  Pulmonary:     Effort: Pulmonary effort is normal.     Breath sounds: Normal breath sounds.  Chest:     Chest wall: Tenderness (left upper) present.  Abdominal:     General: Abdomen is flat.     Palpations: Abdomen is soft.     Tenderness: There is no abdominal tenderness.  Musculoskeletal:        General: No swelling. Normal range of motion.     Cervical back: Neck supple.  Skin:    General: Skin is warm and dry.     Findings: No rash.  Neurological:     General: No focal deficit present.     Mental Status: She is alert.  Psychiatric:        Mood and Affect: Mood normal.     ED Results / Procedures / Treatments   EKG None  Procedures Procedures  Medications Ordered in the ED Medications  fentaNYL (SUBLIMAZE) injection 50 mcg (has no administration in time range)  ketorolac  (TORADOL) 30 MG/ML injection 30 mg (has no administration in time range)    Initial Impression and Plan  Patient with persistent pain from recent atraumatic rib fractures. Will check labs and send for CT to re-eval fractures. Pain meds for comfort. Patient will be signed out to the oncoming team at the change of shift.   ED Course       MDM Rules/Calculators/A&P Medical Decision Making Problems Addressed: Rib pain: acute illness or injury  Amount and/or Complexity of Data Reviewed Labs: ordered. Radiology: ordered.  Risk Prescription drug management. Parenteral controlled substances.    Final Clinical Impression(s) / ED Diagnoses Final diagnoses:  Rib pain    Rx / DC Orders ED Discharge Orders     None        Truddie Hidden, MD 10/16/22 [phone]

## 2022-10-23 NOTE — Progress Notes (Unsigned)
     Future Appointments  Date Time Provider Department  10/24/2022  9:30 AM [name], MD [facility]  11/13/2022  3:45 PM Curt Bears, MD [facility]  12/21/2022                   cpe 11:00 AM [name], MD [facility]  05/21/2023                   wellness 11:00 AM Alycia Rossetti, NP [facility]    History of Present Illness:      This very nice 77 y.o. 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     Medications    levothyroxine  50 MCG tablet, Take  1 tablet  Daily     ezetimibe (ZETIA) 10 MG tablet, Take 1 tablet  daily.   losartan (COZAAR) 50 MG tablet, Take 1 tablet  daily.   CLARITIN-D 12-HR) 5-120 MG tablet, Take 1 tablet 2 ( times daily as needed for allergies.  SINGULAIR 10 MG tablet, Take one tablet for allergies   aspirin 81 MG tablet, Take at bedtime.    ibuprofen  200 MG tablet, Take 400 mg every 6  hours as needed for headache or moderate pain.   PERCOCET  5-325 MG tablet, Take 1 tablet every 6  hours as needed for up to 10 doses for severe pain.    GILOTRIF 30 MG tablet, TAKE 1 TABLET  DAILY    ALPRAZolam (XANAX) 1 MG tablet, Take at bedtime.   lidocaine  5 %, Place 1 patch onto the skin daily   Magnesium 250 MG TABS, Take  daily   melatonin 5 MG TABS, Take at bedtime.  Problem list She has Abnormal glucose; Anxiety; Insomnia; Vitamin D deficiency; Hypothyroidism; Essential hypertension; Hyperlipidemia, mixed; Medication management; Adenocarcinoma of left lung, stage 4 ([place]); Encounter for antineoplastic chemotherapy; Aortic atherosclerosis ([place]) by Abd CT scan on 01/26/2-021; Obesity (BMI 30.0-34.9); Osteopenia; Chest pain; and Metastatic lung cancer (metastasis from lung to other site) [facility]) on  their problem list.   Observations/Objective:  There were no vitals taken for this visit.  HEENT - WNL. Neck - supple.  Chest - Clear equal BS. Cor - Nl HS. RRR w/o sig MGR. PP 1(+). No edema. MS- FROM w/o deformities.  Gait Nl. Neuro -  Nl w/o focal abnormalities.   Assessment and Plan:      Follow Up Instructions:        I discussed the assessment and treatment plan with the patient. The patient was provided an opportunity to ask questions and all were answered. The patient agreed with the plan and demonstrated an understanding of the instructions.       The patient was advised to call back or seek an in-person evaluation if the symptoms worsen or if the condition fails to improve as anticipated.    Kirtland Bouchard, MD

## 2022-10-24 ENCOUNTER — Ambulatory Visit: Payer: Medicare Other | Admitting: Internal Medicine

## 2022-10-30 ENCOUNTER — Ambulatory Visit (INDEPENDENT_AMBULATORY_CARE_PROVIDER_SITE_OTHER): Payer: Medicare Other | Admitting: Nurse Practitioner

## 2022-10-30 ENCOUNTER — Encounter: Payer: Self-pay | Admitting: Nurse Practitioner

## 2022-10-30 VITALS — BP 128/78 | HR 95 | Temp 97.7°F | Ht 60.0 in | Wt 168.6 lb

## 2022-10-30 DIAGNOSIS — R8290 Unspecified abnormal findings in urine: Secondary | ICD-10-CM | POA: Diagnosis not present

## 2022-10-30 DIAGNOSIS — I10 Essential (primary) hypertension: Secondary | ICD-10-CM

## 2022-10-30 DIAGNOSIS — S2242XG Multiple fractures of ribs, left side, subsequent encounter for fracture with delayed healing: Secondary | ICD-10-CM

## 2022-10-30 MED ORDER — NITROFURANTOIN MONOHYD MACRO 100 MG PO CAPS: 100.0000 mg | ORAL_CAPSULE | Freq: Two times a day (BID) | ORAL | 0 refills | Status: AC

## 2022-10-30 NOTE — Patient Instructions (Signed)
Urinary Tract Infection, Adult A urinary tract infection (UTI) is an infection of any part of the urinary tract. The urinary tract includes: The kidneys. The ureters. The bladder. The urethra. These organs make, store, and get rid of pee (urine) in the body. What are the causes? This infection is caused by germs (bacteria) in your genital area. These germs grow and cause swelling (inflammation) of your urinary tract. What increases the risk? The following factors may make you more likely to develop this condition: Using a small, thin tube (catheter) to drain pee. Not being able to control when you pee or poop (incontinence). Being female. If you are female, these things can increase the risk: Using these methods to prevent pregnancy: A medicine that kills sperm (spermicide). A device that blocks sperm (diaphragm). Having low levels of a female hormone (estrogen). Being pregnant. You are more likely to develop this condition if: You have genes that add to your risk. You are sexually active. You take antibiotic medicines. You have trouble peeing because of: A prostate that is bigger than normal, if you are female. A blockage in the part of your body that drains pee from the bladder. A kidney stone. A nerve condition that affects your bladder. Not getting enough to drink. Not peeing often enough. You have other conditions, such as: Diabetes. A weak disease-fighting system (immune system). Sickle cell disease. Gout. Injury of the spine. What are the signs or symptoms? Symptoms of this condition include: Needing to pee right away. Peeing small amounts often. Pain or burning when peeing. Blood in the pee. Pee that smells bad or not like normal. Trouble peeing. Pee that is cloudy. Fluid coming from the vagina, if you are female. Pain in the belly or lower back. Other symptoms include: Vomiting. Not feeling hungry. Feeling mixed up (confused). This may be the first symptom in  older adults. Being tired and grouchy (irritable). A fever. Watery poop (diarrhea). How is this treated? Taking antibiotic medicine. Taking other medicines. Drinking enough water. In some cases, you may need to see a specialist. Follow these instructions at home:  Medicines Take over-the-counter and prescription medicines only as told by your doctor. If you were prescribed an antibiotic medicine, take it as told by your doctor. Do not stop taking it even if you start to feel better. General instructions Make sure you: Pee until your bladder is empty. Do not hold pee for a long time. Empty your bladder after sex. Wipe from front to back after peeing or pooping if you are a female. Use each tissue one time when you wipe. Drink enough fluid to keep your pee pale yellow. Keep all follow-up visits. Contact a doctor if: You do not get better after 1-2 days. Your symptoms go away and then come back. Get help right away if: You have very bad back pain. You have very bad pain in your lower belly. You have a fever. You have chills. You feeling like you will vomit or you vomit. Summary A urinary tract infection (UTI) is an infection of any part of the urinary tract. This condition is caused by germs in your genital area. There are many risk factors for a UTI. Treatment includes antibiotic medicines. Drink enough fluid to keep your pee pale yellow. This information is not intended to replace advice given to you by your health care provider. Make sure you discuss any questions you have with your health care provider. Document Revised: 05/21/2020 Document Reviewed: 05/21/2020 Elsevier Patient Education    2023 [employer] Inc[employer]  

## 2022-10-30 NOTE — Progress Notes (Signed)
Assessment and Plan:  Elizabeth [name] was seen today for urinary tract infection.  Diagnoses and all orders for this visit:  Urine abnormality Strongly encouraged to increase water intake -     Urinalysis, Routine w reflex microscopic -     Urine Culture -     nitrofurantoin, macrocrystal-monohydrate, (MACROBID) 100 MG capsule; Take 1 capsule (100 mg total) by mouth 2 (two) times daily for 7 days.  Closed fracture of multiple ribs of left side with delayed healing, subsequent encounter Continue to monitor Continue to alternate gabapentin and Advil Has follow up chest CT 11/09/22. If ribs remain unhealed will refer to orthopedics for further evaluation and treatement  Essential hypertension - continue medications, DASH diet, exercise and monitor at home. Call if greater than 130/80.      Further disposition pending results of labs. Discussed med's effects and SE's.   Over 30 minutes of exam, counseling, chart review, and critical decision making was performed.   Future Appointments  Date Time Provider [place]  11/09/2022 10:00 AM [facility] None  11/09/2022 11:00 AM [facility] 2 [facility] [place]  11/13/2022  3:45 PM Curt Bears, MD [facility] None  12/21/2022 11:00 AM [name], MD [facility] None  05/21/2023 11:00 AM Alycia Rossetti, NP [facility] None    ------------------------------------------------------------------------------------------------------------------   HPI BP 128/78   Pulse 95   Temp 97.7 F (36.5 C)   Ht 5' (1.524 m)   Wt 168 lb 9.6 oz (76.5 kg)   SpO2 97%   BMI 32.93 kg/m    77 y.o.female presents for complaints of cloudy urine x 3 days and has noticed a slight increase in frequency of urination.  Denies hematuria, dysuria, fevers and back pain. She has not been drinking as much water.  Drinks a lot of unsweet tea.   Pain in ribs has decreased slightly, she is alternating Gabapentin 100 mg TID and Ibuprofen.  Last check  on 10/16/22 at ER showed rib fractures still have not healed.   BP is currently well controlled with Losartan 50 mg QD. Denies headaches, chest pain, shortness of breath and dizziness.  BP Readings from Last 3 Encounters:  10/30/22 128/78  10/16/22 128/80  09/20/22 (!) 168/84   BMI is Body mass index is 32.93 kg/m., she has been working on diet and exercise. Wt Readings from Last 3 Encounters:  10/30/22 168 lb 9.6 oz (76.5 kg)  10/16/22 165 lb (74.8 kg)  09/20/22 171 lb 12.8 oz (77.9 kg)      Past Medical History:  Diagnosis Date   Adenocarcinoma of left lung, stage 4 ([place]) 04/05/2015   Biopsy confirmed CT SCAN: There are innumerable bilateral pulmonary nodules. These range in size from about 5 mm to 2 cm. They demonstrate irregular indistinct borders, the larger ones demonstrating spiculation. PET SCAN: Diffuse pulmonary metastatic disease with a 4 cm dominant left lower low lung lesion. No enlarged or hypermetabolic mediastinal or hilar adenopathy.   Allergy    Anxiety    Cancer ([place])    Change of skin related to chemotherapy 09/25/2017   Drug-induced skin rash 01/17/2016   History of radiation therapy 12/18/2019   SBRT left lung  12/11/2019-12/18/2019   Dr Gery Pray   History of radiation therapy 06/21/2021   left lung 06/14/2021-06/21/2021 Dr Gery Pray   Hyperlipidemia    Hypertension    Hypothyroidism    Insomnia    Prediabetes    Thyroid disease      Allergies  Allergen Reactions  Penicillins Swelling and Rash    Current Outpatient Medications on File Prior to Visit  Medication Sig   afatinib dimaleate (GILOTRIF) 30 MG tablet TAKE 1 TABLET (30MG ) BY MOUTH ONCE DAILY. TAKE ON AN EMPTY STOMACH 1 HOUR BEFORE OR 2 HOURS AFTER A MEAL   ALPRAZolam (XANAX) 1 MG tablet Take 1 mg by mouth at bedtime.   aspirin 81 MG tablet Take 81 mg by mouth at bedtime.    ezetimibe (ZETIA) 10 MG tablet Take 1 tablet (10 mg total) by mouth daily.   ibuprofen (ADVIL,MOTRIN) 200 MG  tablet Take 400 mg by mouth every 6 (six) hours as needed for headache or moderate pain.   levothyroxine (SYNTHROID) 50 MCG tablet Take  1 tablet  Daily  on an empty stomach with only water for 30 minutes & no Antacid meds, Calcium or Magnesium for 4 hours & avoid Biotin                                             /                       TAKE                         BY            MOUTH   losartan (COZAAR) 50 MG tablet Take 1 tablet (50 mg total) by mouth daily.   montelukast (SINGULAIR) 10 MG tablet Take one tablet for allergies   lidocaine (LIDODERM) 5 % Place 1 patch onto the skin daily. Remove & Discard patch within 12 hours or as directed by MD (Patient not taking: Reported on 10/30/2022)   loratadine-pseudoephedrine (CLARITIN-D 12-HOUR) 5-120 MG tablet Take 1 tablet by mouth 2 (two) times daily as needed for allergies. (Patient not taking: Reported on 10/30/2022)   Magnesium 250 MG TABS Take by mouth. (Patient not taking: Reported on 10/30/2022)   melatonin 5 MG TABS Take 5 mg by mouth at bedtime. (Patient not taking: Reported on 10/30/2022)   oxyCODONE-acetaminophen (PERCOCET/ROXICET) 5-325 MG tablet Take 1 tablet by mouth every 6 (six) hours as needed for up to 10 doses for severe pain. (Patient not taking: Reported on 10/30/2022)   No current facility-administered medications on file prior to visit.    ROS: all negative except above.   Physical Exam:  BP 128/78   Pulse 95   Temp 97.7 F (36.5 C)   Ht 5' (1.524 m)   Wt 168 lb 9.6 oz (76.5 kg)   SpO2 97%   BMI 32.93 kg/m   General Appearance: Well nourished, in no apparent distress. Eyes: PERRLA, EOMs, conjunctiva no swelling or erythema Respiratory: Respiratory effort normal, BS equal bilaterally without rales, rhonchi, wheezing or stridor.  Cardio: RRR with no MRGs. Brisk peripheral pulses without edema.  Abdomen: Soft, + BS.  Non tender, no guarding, rebound, hernias, masses. Lymphatics: Non tender without lymphadenopathy.   Musculoskeletal: Full ROM, 5/5 strength, normal gait. Pain left rib area with palpation. Negative CVA tenderness Skin: Warm, dry without rashes, lesions, ecchymosis.  Neuro: Cranial nerves intact. Normal muscle tone, no cerebellar symptoms. Sensation intact.  Psych: Awake and oriented X 3, normal affect, Insight and Judgment appropriate.     Alycia Rossetti, NP 11:24 AM [facility]

## 2022-11-01 LAB — URINALYSIS, ROUTINE W REFLEX MICROSCOPIC
Bilirubin Urine: NEGATIVE
Glucose, UA: NEGATIVE
Hyaline Cast: NONE SEEN /LPF
Ketones, ur: NEGATIVE
Nitrite: POSITIVE — AB
Specific Gravity, Urine: 1.012 (ref 1.001–1.035)
Squamous Epithelial / HPF: NONE SEEN /HPF (ref <=5)
pH: 5.5 (ref 5.0–8.0)

## 2022-11-01 LAB — MICROSCOPIC MESSAGE

## 2022-11-01 LAB — URINE CULTURE
MICRO NUMBER:: 14406038
SPECIMEN QUALITY:: ADEQUATE

## 2022-11-02 ENCOUNTER — Other Ambulatory Visit (HOSPITAL_COMMUNITY): Payer: Self-pay

## 2022-11-07 ENCOUNTER — Other Ambulatory Visit (HOSPITAL_COMMUNITY): Payer: Self-pay

## 2022-11-08 ENCOUNTER — Other Ambulatory Visit (HOSPITAL_COMMUNITY): Payer: Self-pay

## 2022-11-09 ENCOUNTER — Inpatient Hospital Stay: Payer: Medicare Other | Attending: Internal Medicine

## 2022-11-09 ENCOUNTER — Other Ambulatory Visit: Payer: Medicare Other

## 2022-11-09 ENCOUNTER — Ambulatory Visit (HOSPITAL_COMMUNITY)
Admission: RE | Admit: 2022-11-09 | Discharge: 2022-11-09 | Disposition: A | Discharge status: Home / Self Care | Payer: Medicare Other | Source: Ambulatory Visit | Attending: Internal Medicine | Admitting: Internal Medicine

## 2022-11-09 DIAGNOSIS — C3490 Malignant neoplasm of unspecified part of unspecified bronchus or lung: Secondary | ICD-10-CM

## 2022-11-09 DIAGNOSIS — E039 Hypothyroidism, unspecified: Secondary | ICD-10-CM | POA: Insufficient documentation

## 2022-11-09 DIAGNOSIS — Z90710 Acquired absence of both cervix and uterus: Secondary | ICD-10-CM | POA: Insufficient documentation

## 2022-11-09 DIAGNOSIS — I10 Essential (primary) hypertension: Secondary | ICD-10-CM | POA: Insufficient documentation

## 2022-11-09 DIAGNOSIS — R079 Chest pain, unspecified: Secondary | ICD-10-CM | POA: Insufficient documentation

## 2022-11-09 DIAGNOSIS — Z923 Personal history of irradiation: Secondary | ICD-10-CM | POA: Insufficient documentation

## 2022-11-09 DIAGNOSIS — F419 Anxiety disorder, unspecified: Secondary | ICD-10-CM | POA: Insufficient documentation

## 2022-11-09 DIAGNOSIS — Z79899 Other long term (current) drug therapy: Secondary | ICD-10-CM | POA: Insufficient documentation

## 2022-11-09 DIAGNOSIS — C3432 Malignant neoplasm of lower lobe, left bronchus or lung: Secondary | ICD-10-CM | POA: Insufficient documentation

## 2022-11-09 DIAGNOSIS — I700 Atherosclerosis of aorta: Secondary | ICD-10-CM | POA: Insufficient documentation

## 2022-11-09 DIAGNOSIS — Z880 Allergy status to penicillin: Secondary | ICD-10-CM | POA: Insufficient documentation

## 2022-11-09 DIAGNOSIS — Z79890 Hormone replacement therapy (postmenopausal): Secondary | ICD-10-CM | POA: Insufficient documentation

## 2022-11-09 DIAGNOSIS — Z90722 Acquired absence of ovaries, bilateral: Secondary | ICD-10-CM | POA: Insufficient documentation

## 2022-11-09 DIAGNOSIS — E785 Hyperlipidemia, unspecified: Secondary | ICD-10-CM | POA: Insufficient documentation

## 2022-11-09 DIAGNOSIS — R0609 Other forms of dyspnea: Secondary | ICD-10-CM | POA: Insufficient documentation

## 2022-11-09 LAB — CBC WITH DIFFERENTIAL (CANCER CENTER ONLY)
Abs Immature Granulocytes: 0.03 10*3/uL (ref 0.00–0.07)
Basophils Absolute: 0 10*3/uL (ref 0.0–0.1)
Basophils Relative: 1 %
Eosinophils Absolute: 0.4 10*3/uL (ref 0.0–0.5)
Eosinophils Relative: 5 %
HCT: 36.2 % (ref 36.0–46.0)
Hemoglobin: 12.2 g/dL (ref 12.0–15.0)
Immature Granulocytes: 1 %
Lymphocytes Relative: 20 %
Lymphs Abs: 1.3 10*3/uL (ref 0.7–4.0)
MCH: 28.5 pg (ref 26.0–34.0)
MCHC: 33.7 g/dL (ref 30.0–36.0)
MCV: 84.6 fL (ref 80.0–100.0)
Monocytes Absolute: 0.6 10*3/uL (ref 0.1–1.0)
Monocytes Relative: 9 %
Neutro Abs: 4.3 10*3/uL (ref 1.7–7.7)
Neutrophils Relative %: 64 %
Platelet Count: 207 10*3/uL (ref 150–400)
RBC: 4.28 MIL/uL (ref 3.87–5.11)
RDW: 13 % (ref 11.5–15.5)
WBC Count: 6.7 10*3/uL (ref 4.0–10.5)
nRBC: 0 % (ref 0.0–0.2)

## 2022-11-09 LAB — CMP (CANCER CENTER ONLY)
ALT: 13 U/L (ref 0–44)
AST: 18 U/L (ref 15–41)
Albumin: 3.7 g/dL (ref 3.5–5.0)
Alkaline Phosphatase: 65 U/L (ref 38–126)
Anion gap: 7 (ref 5–15)
BUN: 17 mg/dL (ref 8–23)
CO2: 27 mmol/L (ref 22–32)
Calcium: 9.1 mg/dL (ref 8.9–10.3)
Chloride: 107 mmol/L (ref 98–111)
Creatinine: 0.89 mg/dL (ref 0.44–1.00)
GFR, Estimated: >60 mL/min (ref >60)
Glucose, Bld: 80 mg/dL (ref 70–99)
Potassium: 4.2 mmol/L (ref 3.5–5.1)
Sodium: 141 mmol/L (ref 135–145)
Total Bilirubin: 0.7 mg/dL (ref 0.3–1.2)
Total Protein: 6.4 g/dL — ABNORMAL LOW (ref 6.5–8.1)

## 2022-11-09 MED ORDER — IOHEXOL 300 MG/ML  SOLN
100.0000 mL | Freq: Once | INTRAMUSCULAR | Status: AC | PRN
  Administered 2022-11-09: 100 mL via INTRAVENOUS

## 2022-11-10 ENCOUNTER — Other Ambulatory Visit: Payer: Self-pay | Admitting: Internal Medicine

## 2022-11-10 ENCOUNTER — Encounter: Payer: Self-pay | Admitting: Internal Medicine

## 2022-11-10 MED ORDER — GABAPENTIN 600 MG PO TABS: ORAL_TABLET | ORAL | 1 refills | Status: DC

## 2022-11-13 ENCOUNTER — Other Ambulatory Visit: Payer: Self-pay

## 2022-11-13 ENCOUNTER — Inpatient Hospital Stay (HOSPITAL_BASED_OUTPATIENT_CLINIC_OR_DEPARTMENT_OTHER): Payer: Medicare Other | Admitting: Internal Medicine

## 2022-11-13 VITALS — BP 156/78 | HR 75 | Temp 98.1°F | Resp 16 | Wt 170.3 lb

## 2022-11-13 DIAGNOSIS — Z923 Personal history of irradiation: Secondary | ICD-10-CM | POA: Diagnosis not present

## 2022-11-13 DIAGNOSIS — R0609 Other forms of dyspnea: Secondary | ICD-10-CM | POA: Diagnosis not present

## 2022-11-13 DIAGNOSIS — Z79899 Other long term (current) drug therapy: Secondary | ICD-10-CM | POA: Diagnosis not present

## 2022-11-13 DIAGNOSIS — C3432 Malignant neoplasm of lower lobe, left bronchus or lung: Secondary | ICD-10-CM | POA: Diagnosis not present

## 2022-11-13 DIAGNOSIS — I10 Essential (primary) hypertension: Secondary | ICD-10-CM | POA: Diagnosis not present

## 2022-11-13 DIAGNOSIS — Z90710 Acquired absence of both cervix and uterus: Secondary | ICD-10-CM | POA: Diagnosis not present

## 2022-11-13 DIAGNOSIS — E039 Hypothyroidism, unspecified: Secondary | ICD-10-CM | POA: Diagnosis not present

## 2022-11-13 DIAGNOSIS — F419 Anxiety disorder, unspecified: Secondary | ICD-10-CM | POA: Diagnosis not present

## 2022-11-13 DIAGNOSIS — C3490 Malignant neoplasm of unspecified part of unspecified bronchus or lung: Secondary | ICD-10-CM

## 2022-11-13 DIAGNOSIS — E785 Hyperlipidemia, unspecified: Secondary | ICD-10-CM | POA: Diagnosis not present

## 2022-11-13 DIAGNOSIS — I700 Atherosclerosis of aorta: Secondary | ICD-10-CM | POA: Diagnosis not present

## 2022-11-13 DIAGNOSIS — Z880 Allergy status to penicillin: Secondary | ICD-10-CM | POA: Diagnosis not present

## 2022-11-13 DIAGNOSIS — R079 Chest pain, unspecified: Secondary | ICD-10-CM | POA: Diagnosis not present

## 2022-11-13 DIAGNOSIS — Z90722 Acquired absence of ovaries, bilateral: Secondary | ICD-10-CM | POA: Diagnosis not present

## 2022-11-13 DIAGNOSIS — Z79890 Hormone replacement therapy (postmenopausal): Secondary | ICD-10-CM | POA: Diagnosis not present

## 2022-11-13 NOTE — Progress Notes (Signed)
[facility] [phone]) [phone]   [phone]  OFFICE PROGRESS NOTE  Lucky Cowboy, MD [address] [place] [address]  DIAGNOSIS: Stage IV (T2a, N0, M1a) non-small cell lung cancer, adenocarcinoma with positive EGFR mutation with deletion in exon 19 diagnosed in June 2016 and presented with large mass in the left lower lobe in addition to multiple bilateral pulmonary nodules  PRIOR THERAPY:  1) Gilotrif 40 mg by mouth daily started 05/11/2015, status post 9 months of treatment discontinued on 02/17/2016 secondary to extensive skin rash. 2) SBRT to the enlarging left upper lobe nodule under the care of Dr. Mitzi Hansen.  CURRENT THERAPY: Gilotrif 30 mg by mouth daily started 02/21/2016 status post 79 months of treatment.  INTERVAL HISTORY: Elizabeth [name] 77 y.o. female returns to the clinic today for follow-up visit.  The patient is feeling fine today with no concerning complaints.  The pain on the left side of the chest had significantly improved.  She is currently on treatment with gabapentin and occasional ibuprofen if needed.  She denied having any current shortness of breath, cough or hemoptysis.  She has no nausea, vomiting, diarrhea or constipation.  She has no headache or visual changes.  She has no recent weight loss or night sweats.  She continues to tolerate her treatment with dilatory fairly well.  She is here today for evaluation with repeat CT scan of the chest, abdomen and pelvis for restaging of her disease.     MEDICAL HISTORY: Past Medical History:  Diagnosis Date   Adenocarcinoma of left lung, stage 4 (HCC) 04/05/2015   Biopsy confirmed CT SCAN: There are innumerable bilateral pulmonary nodules. These range in size from about 5 mm to 2 cm. They demonstrate irregular indistinct borders, the larger ones demonstrating spiculation. PET SCAN: Diffuse pulmonary metastatic disease with a 4 cm dominant left lower low lung lesion.  No enlarged or hypermetabolic mediastinal or hilar adenopathy.   Allergy    Anxiety    Cancer (HCC)    Change of skin related to chemotherapy 09/25/2017   Drug-induced skin rash 01/17/2016   History of radiation therapy 12/18/2019   SBRT left lung  12/11/2019-12/18/2019   Dr Antony Blackbird   History of radiation therapy 06/21/2021   left lung 06/14/2021-06/21/2021 Dr Antony Blackbird   Hyperlipidemia    Hypertension    Hypothyroidism    Insomnia    Prediabetes    Thyroid disease     ALLERGIES:  is allergic to penicillins.  MEDICATIONS:  Current Outpatient Medications  Medication Sig Dispense Refill   afatinib dimaleate (GILOTRIF) 30 MG tablet TAKE 1 TABLET (30MG ) BY MOUTH ONCE DAILY. TAKE ON AN EMPTY STOMACH 1 HOUR BEFORE OR 2 HOURS AFTER A MEAL 30 tablet 2   ALPRAZolam (XANAX) 1 MG tablet Take 1 mg by mouth at bedtime.     aspirin 81 MG tablet Take 81 mg by mouth at bedtime.      ezetimibe (ZETIA) 10 MG tablet Take 1 tablet (10 mg total) by mouth daily. 90 tablet 3   gabapentin (NEURONTIN) 600 MG tablet Take 1/2 to 1 tablet 2 to 3 x / Daily as needed for Pain 270 tablet 1   ibuprofen (ADVIL,MOTRIN) 200 MG tablet Take 400 mg by mouth every 6 (six) hours as needed for headache or moderate pain.     levothyroxine (SYNTHROID) 50 MCG tablet Take  1 tablet  Daily  on an empty stomach with only water for  30 minutes & no Antacid meds, Calcium or Magnesium for 4 hours & avoid Biotin                                             /                       TAKE                         BY            MOUTH 90 tablet 3   losartan (COZAAR) 50 MG tablet Take 1 tablet (50 mg total) by mouth daily. 30 tablet 11   montelukast (SINGULAIR) 10 MG tablet Take one tablet for allergies 90 tablet 3   No current facility-administered medications for this visit.    SURGICAL HISTORY:  Past Surgical History:  Procedure Laterality Date   ABDOMINAL HYSTERECTOMY  10/23/1993   w BSO   CYST REMOVAL HAND Right    EXCISION  MASS ABDOMINAL N/A 08/11/2021   Procedure: EXCISIONAL BIOPSY OF LEFT RETROPERITONEAL MASS, POSSIBLE INTRAOPERATIVE LAPAROSCOPIC ULTRASOUND;  Surgeon: Karie Soda, MD;  Location: [facility];  Service: General;  Laterality: N/A;   LAPAROSCOPY N/A 08/11/2021   Procedure: LAPAROSCOPIC EXPLORATION;  Surgeon: Karie Soda, MD;  Location: [facility];  Service: General;  Laterality: N/A;   TONSILLECTOMY AND ADENOIDECTOMY      REVIEW OF SYSTEMS:  Constitutional: negative Eyes: negative Ears, nose, mouth, throat, and face: negative Respiratory: positive for dyspnea on exertion Cardiovascular: negative Gastrointestinal: negative Genitourinary:negative Integument/breast: negative Hematologic/lymphatic: negative Musculoskeletal:negative Neurological: negative Behavioral/Psych: negative Endocrine: negative Allergic/Immunologic: negative   PHYSICAL EXAMINATION: General appearance: alert, cooperative, and no distress Head: Normocephalic, without obvious abnormality, atraumatic Neck: no adenopathy, no JVD, supple, symmetrical, trachea midline, and thyroid not enlarged, symmetric, no tenderness/mass/nodules Lymph nodes: Cervical, supraclavicular, and axillary nodes normal. Resp: clear to auscultation bilaterally Back: symmetric, no curvature. ROM normal. No CVA tenderness. Cardio: regular rate and rhythm, S1, S2 normal, no murmur, click, rub or gallop GI: soft, non-tender; bowel sounds normal; no masses,  no organomegaly Extremities: extremities normal, atraumatic, no cyanosis or edema Neurologic: Alert and oriented X 3, normal strength and tone. Normal symmetric reflexes. Normal coordination and gait  ECOG PERFORMANCE STATUS: 1 - Symptomatic but completely ambulatory  Blood pressure (!) 156/78, pulse 75, temperature 98.1 F (36.7 C), resp. rate 16, weight 170 lb 5 oz (77.3 kg), SpO2 98 %.  LABORATORY DATA: Lab Results  Component Value Date   WBC 6.7 11/09/2022   HGB 12.2 11/09/2022   HCT 36.2  11/09/2022   MCV 84.6 11/09/2022   PLT 207 11/09/2022      Chemistry      Component Value Date/Time   NA 141 11/09/2022 0927   NA 139 10/26/2017 1252   K 4.2 11/09/2022 0927   K 4.1 10/26/2017 1252   CL 107 11/09/2022 0927   CO2 27 11/09/2022 0927   CO2 28 10/26/2017 1252   BUN 17 11/09/2022 0927   BUN 15.7 10/26/2017 1252   CREATININE 0.89 11/09/2022 0927   CREATININE 0.84 09/20/2022 0000   CREATININE 1.0 10/26/2017 1252      Component Value Date/Time   CALCIUM 9.1 11/09/2022 0927   CALCIUM 8.8 10/26/2017 1252   ALKPHOS 65 11/09/2022 0927   ALKPHOS 70 10/26/2017 1252  AST 18 11/09/2022 0927   AST 19 10/26/2017 1252   ALT 13 11/09/2022 0927   ALT 22 10/26/2017 1252   BILITOT 0.7 11/09/2022 0927   BILITOT 0.90 10/26/2017 1252       RADIOGRAPHIC STUDIES: CT Chest W Contrast  Result Date: 11/10/2022 CLINICAL DATA:  Restaging non-small cell lung cancer. * Tracking Code: BO *. EXAM: CT CHEST, ABDOMEN, AND PELVIS WITH CONTRAST TECHNIQUE: Multidetector CT imaging of the chest, abdomen and pelvis was performed following the standard protocol during bolus administration of intravenous contrast. RADIATION DOSE REDUCTION: This exam was performed according to the departmental dose-optimization program which includes automated exposure control, adjustment of the mA and/or kV according to patient size and/or use of iterative reconstruction technique. CONTRAST:  OMNIPAQUE IOHEXOL 300 MG/ML  SOLN COMPARISON:  CT chest from 10/16/2022 and CT C AP from 07/13/2022 FINDINGS: CT CHEST FINDINGS Cardiovascular: Heart size appears within normal limits. Aortic atherosclerosis. No pericardial effusion. Mediastinum/Nodes: 9 mm left lobe of thyroid gland nodule is noted, image 10/2. Not clinically significant; no follow-up imaging recommended (ref: J Am Coll Radiol. 2015 Feb;12(2): 143-50). The trachea appears patent and midline. Normal appearance of the esophagus. No enlarged axillary,  mediastinal, or hilar lymph nodes. Lungs/Pleura: No pleural fluid identified. No acute airspace disease. Stable bandlike area of scarring within the left upper lobe compatible with external beam radiation. Masslike architectural distortion within the left midlung is again noted and is also unchanged from the previous exam compatible with changes due to external beam radiation. Again seen are multiple scattered, irregular and sub solid nodular densities within the right lung. This includes: Reference right lower lobe lung nodule identified on the previous exam is stable measuring 1.0 by 0.8 cm, image 73/4 Reference nodule in the right upper lobe measures 1.0 x 0.7 cm, image 32/4. Also stable in the interval. Solid nodule within the right upper lobe measures 0.7 cm, image 45/4. Unchanged from previous exam. Within the right lower lobe there is a predominantly solid nodule which measures 0.8 cm, image 79/4. On 03/14/2022 this measured 0.5 cm. On the more recent prior study from 10/16/2022 this measured 6 mm. Within the medial left apex there is a subpleural nodular density which measures 1.2 cm. This is compared with 0.7 cm on 11/04/2021. On the more recent study from 10/16/2022 this measured the same. Musculoskeletal: Fracture deformities involving the lateral aspect of the left third and fourth ribs are identified, image 42/4 and image 47/4. These are unchanged from 10/16/2022. No suspicious bone lesions identified. CT ABDOMEN PELVIS FINDINGS Hepatobiliary: No suspicious liver lesions. Posterior right lobe of liver hemangioma measures 3.3 cm, image 48/2. Gallbladder is normal. No bile duct dilatation. Pancreas: Unremarkable. No pancreatic ductal dilatation or surrounding inflammatory changes. Spleen: Normal in size without focal abnormality. Adrenals/Urinary Tract: Normal adrenal glands. No nephrolithiasis, hydronephrosis or kidney mass. Urinary bladder is unremarkable. Stomach/Bowel: Small hiatal hernia. The  appendix is visualized and appears normal. No pathologic dilatation of the large or small bowel loops. Vascular/Lymphatic: Aortic atherosclerosis. No aneurysm. No signs of abdominopelvic adenopathy. Reproductive: Status post hysterectomy. No adnexal masses. Other: Soft tissue nodule within the right posterior perirenal space measures 1.3 cm, image 69/2. Previously 0.9 cm. Along the right pericolic gutter there is a soft tissue nodule which measures 0.9 cm, image 77/2. Unchanged from previous exam. Nodule along the left peritoneal reflection measures 0.5 cm, image 78/2. Previously this measured the same. No ascites. Musculoskeletal: No acute or suspicious bone lesions. Lumbar degenerative disc disease.  IMPRESSION: 1. Slowly enlarging, predominantly solid nodule within the right lower lobe which measures 0.8 cm. This measured 0.5 cm on 03/14/2022. On the more recent prior study from 10/16/2022 this measured 0.6 cm. Suspicious for developing neoplasm. 2. Subpleural paramediastinal nodule within the left upper lobe also exhibits gradual increase in size when compared with study from 11/04/2021 3. Additional irregular sub solid nodules within the right lung are stable in the interval. 4. Continued increase in size soft tissue nodule within the right posterior pararenal space suspicious for metastatic disease. 5. Two peritoneal nodules are identified along the peritoneal reflections bilaterally. These are not significantly changed in the interval. 6. Stable appearance of post treatment change within the left lung. 7. Stable appearance of fractures involving the lateral aspect of the left third and fourth ribs. 8. Small hiatal hernia. 9. Liver hemangioma. 10. Lumbar degenerative disc disease. 11.  Aortic Atherosclerosis (ICD10-I70.0). Electronically Signed   By: Signa Kell M.D.   On: 11/10/2022 09:27   CT Abdomen Pelvis W Contrast  Result Date: 11/10/2022 CLINICAL DATA:  Restaging non-small cell lung cancer. *  Tracking Code: BO *. EXAM: CT CHEST, ABDOMEN, AND PELVIS WITH CONTRAST TECHNIQUE: Multidetector CT imaging of the chest, abdomen and pelvis was performed following the standard protocol during bolus administration of intravenous contrast. RADIATION DOSE REDUCTION: This exam was performed according to the departmental dose-optimization program which includes automated exposure control, adjustment of the mA and/or kV according to patient size and/or use of iterative reconstruction technique. CONTRAST:  OMNIPAQUE IOHEXOL 300 MG/ML  SOLN COMPARISON:  CT chest from 10/16/2022 and CT C AP from 07/13/2022 FINDINGS: CT CHEST FINDINGS Cardiovascular: Heart size appears within normal limits. Aortic atherosclerosis. No pericardial effusion. Mediastinum/Nodes: 9 mm left lobe of thyroid gland nodule is noted, image 10/2. Not clinically significant; no follow-up imaging recommended (ref: J Am Coll Radiol. 2015 Feb;12(2): 143-50). The trachea appears patent and midline. Normal appearance of the esophagus. No enlarged axillary, mediastinal, or hilar lymph nodes. Lungs/Pleura: No pleural fluid identified. No acute airspace disease. Stable bandlike area of scarring within the left upper lobe compatible with external beam radiation. Masslike architectural distortion within the left midlung is again noted and is also unchanged from the previous exam compatible with changes due to external beam radiation. Again seen are multiple scattered, irregular and sub solid nodular densities within the right lung. This includes: Reference right lower lobe lung nodule identified on the previous exam is stable measuring 1.0 by 0.8 cm, image 73/4 Reference nodule in the right upper lobe measures 1.0 x 0.7 cm, image 32/4. Also stable in the interval. Solid nodule within the right upper lobe measures 0.7 cm, image 45/4. Unchanged from previous exam. Within the right lower lobe there is a predominantly solid nodule which measures 0.8 cm, image  79/4. On 03/14/2022 this measured 0.5 cm. On the more recent prior study from 10/16/2022 this measured 6 mm. Within the medial left apex there is a subpleural nodular density which measures 1.2 cm. This is compared with 0.7 cm on 11/04/2021. On the more recent study from 10/16/2022 this measured the same. Musculoskeletal: Fracture deformities involving the lateral aspect of the left third and fourth ribs are identified, image 42/4 and image 47/4. These are unchanged from 10/16/2022. No suspicious bone lesions identified. CT ABDOMEN PELVIS FINDINGS Hepatobiliary: No suspicious liver lesions. Posterior right lobe of liver hemangioma measures 3.3 cm, image 48/2. Gallbladder is normal. No bile duct dilatation. Pancreas: Unremarkable. No pancreatic ductal dilatation or surrounding  inflammatory changes. Spleen: Normal in size without focal abnormality. Adrenals/Urinary Tract: Normal adrenal glands. No nephrolithiasis, hydronephrosis or kidney mass. Urinary bladder is unremarkable. Stomach/Bowel: Small hiatal hernia. The appendix is visualized and appears normal. No pathologic dilatation of the large or small bowel loops. Vascular/Lymphatic: Aortic atherosclerosis. No aneurysm. No signs of abdominopelvic adenopathy. Reproductive: Status post hysterectomy. No adnexal masses. Other: Soft tissue nodule within the right posterior perirenal space measures 1.3 cm, image 69/2. Previously 0.9 cm. Along the right pericolic gutter there is a soft tissue nodule which measures 0.9 cm, image 77/2. Unchanged from previous exam. Nodule along the left peritoneal reflection measures 0.5 cm, image 78/2. Previously this measured the same. No ascites. Musculoskeletal: No acute or suspicious bone lesions. Lumbar degenerative disc disease. IMPRESSION: 1. Slowly enlarging, predominantly solid nodule within the right lower lobe which measures 0.8 cm. This measured 0.5 cm on 03/14/2022. On the more recent prior study from 10/16/2022 this measured  0.6 cm. Suspicious for developing neoplasm. 2. Subpleural paramediastinal nodule within the left upper lobe also exhibits gradual increase in size when compared with study from 11/04/2021 3. Additional irregular sub solid nodules within the right lung are stable in the interval. 4. Continued increase in size soft tissue nodule within the right posterior pararenal space suspicious for metastatic disease. 5. Two peritoneal nodules are identified along the peritoneal reflections bilaterally. These are not significantly changed in the interval. 6. Stable appearance of post treatment change within the left lung. 7. Stable appearance of fractures involving the lateral aspect of the left third and fourth ribs. 8. Small hiatal hernia. 9. Liver hemangioma. 10. Lumbar degenerative disc disease. 11.  Aortic Atherosclerosis (ICD10-I70.0). Electronically Signed   By: Signa Kell M.D.   On: 11/10/2022 09:27   CT Chest W Contrast  Result Date: 10/16/2022 CLINICAL DATA:  Rib fracture suspected.  History of lung cancer EXAM: CT CHEST WITH CONTRAST TECHNIQUE: Multidetector CT imaging of the chest was performed during intravenous contrast administration. RADIATION DOSE REDUCTION: This exam was performed according to the departmental dose-optimization program which includes automated exposure control, adjustment of the mA and/or kV according to patient size and/or use of iterative reconstruction technique. CONTRAST:  OMNIPAQUE IOHEXOL 300 MG/ML  SOLN COMPARISON:  07/13/2022 FINDINGS: Cardiovascular: No significant vascular findings. Normal heart size. No pericardial effusion. Some thinning and low-density at the left ventricular apex which may be related to prior subendocardial infarct. Mild for age atheromatous calcification. Mediastinum/Nodes: Negative for adenopathy or mass. Lungs/Pleura: Radiation fibrosis in the left lung at the upper lobe in major fissure, no interval change from most recent staging CT. Reticular  and streaky densities in the right lung remains stable. No acute airspace disease, edema, effusion, or air leak. Upper Abdomen: Hemangioma in the posterior right hepatic lobe measuring 3.3 cm. Musculoskeletal: Displaced left third and fourth rib fractures, known from prior CT. There is periosteal reaction without bridging. No newly seen fracture or destructive bone lesion. IMPRESSION: 1. Displaced left third and fourth rib fractures known from chest CT 07/13/2022, not acute but also not healed. Question underlying radiation injury given proximity to treated left lung. 2. Stable post treatment left lung and patchy reticular opacities in the right lung, under imaging surveillance for lung cancer. Electronically Signed   By: Tiburcio Pea M.D.   On: 10/16/2022 09:23     ASSESSMENT AND PLAN:  This is a very pleasant 77 years old white female with stage IV non-small cell lung cancer, adenocarcinoma with positive EGFR mutation with  deletion in exon 19 and currently undergoing treatment with Gilotrif initially at a dose of 40 mg for 9 months followed by 30 mg by mouth daily status post 79 months.  She also underwent SBRT to the enlarging left upper lobe lung nodule. Her PET scan showed hypermetabolic subcutaneous nodule anterior to the left shoulder musculature concerning for cutaneous metastasis in addition to the enlarging nodule in the retroperitoneum anterior to the left iliacus muscle that also hypermetabolic and concerning for metastatic deposit with enlarging hypermetabolic nodule and consolidation peripherally in the left upper lobe concerning for lung cancer recurrence. She had surgical resection of the soft tissue lesion in the retroperitoneum anterior to the left iliacus muscle and this was consistent with follicular lymphoma. The patient continues to tolerate her treatment with general 3 fairly well with no concerning adverse effect except for mild skin rash and few episodes of diarrhea. She had  repeat CT scan of the chest, abdomen and pelvis performed recently.  I personally and independently reviewed the scan images and discussed the result and showed the images to the patient today. Her scan showed stable disease except for a slightly enlarging right lower lobe pulmonary nodule as well as soft tissue nodule in the posterior pararenal space. The abdominal nodule could be similar to the follicular lymphoma she had in the past and the pulmonary nodule in the right lower lobe slightly increased in size and we will continue to monitor closely. I recommended for the patient to continue her current treatment with GILOTRIF with the same dose for now. I will see her back for follow-up visit in 3 months for evaluation and repeat CT scan of the chest, abdomen and pelvis for restaging of her disease. If she continues to have evidence for disease progression, I will consider her for repeat PET scan and probably rebiopsy to rule out any resistant mutation. The patient was advised to call immediately if she has any other concerning symptoms in the interval. The patient voices understanding of current disease status and treatment options and is in agreement with the current care plan. All questions were answered. The patient knows to call the clinic with any problems, questions or concerns. We can certainly see the patient much sooner if necessary. The total time spent in the appointment was 30 minutes.  Disclaimer: This note was dictated with voice recognition software. Similar sounding words can inadvertently be transcribed and may not be corrected upon review.

## 2022-12-05 ENCOUNTER — Other Ambulatory Visit (HOSPITAL_COMMUNITY): Payer: Self-pay

## 2022-12-05 ENCOUNTER — Encounter: Payer: Medicare Other | Admitting: Internal Medicine

## 2022-12-07 ENCOUNTER — Other Ambulatory Visit: Payer: Self-pay

## 2022-12-18 ENCOUNTER — Other Ambulatory Visit: Payer: Self-pay

## 2022-12-19 ENCOUNTER — Other Ambulatory Visit (HOSPITAL_COMMUNITY): Payer: Self-pay

## 2022-12-20 ENCOUNTER — Encounter: Payer: Self-pay | Admitting: Internal Medicine

## 2022-12-20 NOTE — Patient Instructions (Signed)

## 2022-12-20 NOTE — Progress Notes (Signed)
Annual Screening/Preventative Visit & Comprehensive Evaluation &  Examination  Future Appointments  Date Time Provider Department  12/21/2022                    cpe 11:00 AM [name], MD [facility]  02/12/2023 11:00 AM Curt Bears, MD [facility]  05/21/2023                    wellness 11:00 AM Alycia Rossetti, NP [facility]  12/24/2023                       cpe 10:00 AM [name], MD [facility]        This very nice 77 y.o. MWF with   HTN, HLD, Prediabetes  and Vitamin D Deficiency  presents for a Screening /Preventative Visit & comprehensive evaluation and management of multiple medical co-morbidities.  Abd CT scan in 2021 revealed Aortic Atherosclerosis .                                                        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 She also has been dx'd with an indolent Follicular Lymphoma.          HTN predates since 1997. Patient's BP has been controlled at home and patient denies any cardiac symptoms as chest pain, palpitations, shortness of breath, dizziness or ankle swelling. Today's BP was initially elevated & rechecked at goal -  102/62.        Patient's hyperlipidemia is controlled with diet and medications. Patient denies myalgias or other medication SE's. Last lipids were not at goal :  Lab Results  Component Value Date   CHOL 165 09/20/2022   HDL 47 (L) 09/20/2022   LDLCALC 101 (H) 09/20/2022   TRIG 84 09/20/2022   CHOLHDL 3.5 09/20/2022         Patient has hx/o prediabetes (A1c 5.7% /2012) and patient denies reactive hypoglycemic symptoms, visual blurring, diabetic polys or paresthesias. Last A1c was at  goal :  Lab Results  Component Value Date   HGBA1C 5.5 05/18/2022                                             In 1996, patient was dx'd Hypothyroid & has been on thyroid replacement since .        Finally, patient has history of Vitamin D  Deficiency and last Vitamin D was at goal :  Lab Results  Component Value Date   VD25OH 83 11/29/2021       Current Outpatient Medications on File Prior to Visit  Medication Sig   afatinib dimaleate (GILOTRIF) 30 MG tablet TAKE 1 TABLET ONCE DAILY. TAKE ON AN EMPTY STOMACH 1 HOUR BEFORE OR 2 HOURS AFTER A MEAL   ALPRAZolam 1 MG tablet Take  at bedtime. Gets from a Dr Neta Mends ! )    aspirin 81 MG tablet Take  at bedtime.    ezetimibe 10 MG tablet Take daily.   hydrochlorothiazide 25 MG tablet Take  daily.   ibuprofen  200  MG tablet Take 400 mg every 6  hours as needed    levothyroxine 50 MCG tablet TAKE 1 TABLET DAILY    CLARITIN-D 12-HR  (5-120 MG)  tablet Take 1 tablet 2  times daily as needed   melatonin 5 MG TABS Take at bedtime.   montelukast 10 MG tablet TAKE 1 TABLET DAILY      Allergies  Allergen Reactions   Penicillins Swelling and Rash     Past Medical History:  Diagnosis Date   Adenocarcinoma of left lung, stage 4 ([place]) 04/05/2015   Biopsy confirmed CT SCAN: There are innumerable bilateral pulmonary nodules. These range in size from about 5 mm to 2 cm. They demonstrate irregular indistinct borders, the larger ones demonstrating spiculation. PET SCAN: Diffuse pulmonary metastatic disease with a 4 cm dominant left lower low lung lesion. No enlarged or hypermetabolic mediastinal or hilar adenopathy.   Allergy    Anxiety    Cancer ([place])    Change of skin related to chemotherapy 09/25/2017   Drug-induced skin rash 01/17/2016   History of radiation therapy 12/18/2019   SBRT left lung  12/11/2019-12/18/2019   Dr Gery Pray   History of radiation therapy 06/21/2021   left lung 06/14/2021-06/21/2021 Dr Gery Pray   Hyperlipidemia    Hypertension    Hypothyroidism    Insomnia    Prediabetes    Thyroid disease      Health Maintenance  Topic Date Due   Zoster Vaccines- Shingrix (1 of 2) Never done   TETANUS/TDAP  12/26/2015   DEXA SCAN  10/31/2020   INFLUENZA  VACCINE  05/23/2021   COVID-19 Vaccine (5 - Booster for Moderna series) 07/11/2021   COLONOSCOPY  11/19/2022   MAMMOGRAM  11/12/2023   Pneumonia Vaccine 75+ Years old  Completed   HPV VACCINES  Aged Out   Hepatitis C Screening  Discontinued     Immunization History  Administered Date(s) Administered   Influenza Split 07/16/2018   Influenza, High Dose 08/06/2017, 07/09/2019, 07/09/2019, 07/07/2020   Influenza-Unspecified 09/13/2015, 07/23/2016, 08/06/2017, 07/23/2018   Moderna Sars-Covid-2 Vacc 12/05/2019, 01/03/2020, 07/26/2020, 05/16/2021   Pneumococcal -13 12/24/2015   Pneumococcal -23 07/22/2012   Pneumococcal -23 07/22/2012   Td 12/25/2005   Zoster, Live 01/09/2007    Last Colon - 11/19/2012 - Dr Deborha Payment - Recc 10 yr f/u - due Feb 2024    Last MGM - 06/20/2022 -    Last dexaBMD - 07/24/2022 - Osteopenia   Past Surgical History:  Procedure Laterality Date   ABDOMINAL HYSTERECTOMY  10/23/1993   w BSO   CYST REMOVAL HAND Right    EXCISION MASS ABDOMINAL N/A 08/11/2021   Procedure: EXCISIONAL BIOPSY OF LEFT RETROPERITONEAL MASS, POSSIBLE INTRAOPERATIVE LAPAROSCOPIC ULTRASOUND;  Surgeon: Michael Boston, MD;  Location: [facility];  Service: General;  Laterality: N/A;   LAPAROSCOPY N/A 08/11/2021   Procedure: LAPAROSCOPIC EXPLORATION;  Surgeon: Michael Boston, MD;  Location: [facility];  Service: General;  Laterality: N/A;   TONSILLECTOMY AND ADENOIDECTOMY       Family History  Problem Relation Age of Onset   Liver disease Mother    Alcohol abuse Mother    ALS Father    Hypertension Father    Dementia Maternal Grandmother 94   Lung disease Maternal Grandfather    Lung cancer Paternal Grandmother 55   Alcohol abuse Paternal Grandfather    Heart disease Paternal Grandfather    Melanoma Paternal Aunt    Bone cancer Maternal Uncle    Dementia Paternal  Aunt 90   Breast cancer Cousin      Social History   Tobacco Use   Smoking status: Former    Packs/day: 0.50     Years: 10.00    Pack years: 5.00    Types: Cigarettes    Quit date: 10/23/1974    Years since quitting: 47.1   Smokeless tobacco: Never  Vaping Use   Vaping Use: Never used  Substance Use Topics   Alcohol use: No   Drug use: No      ROS Constitutional: Denies fever, chills, weight loss/gain, headaches, insomnia,  night sweats, and change in appetite. Does c/o fatigue. Eyes: Denies redness, blurred vision, diplopia, discharge, itchy, watery eyes.  ENT: Denies discharge, congestion, post nasal drip, epistaxis, sore throat, earache, hearing loss, dental pain, Tinnitus, Vertigo, Sinus pain, snoring.  Cardio: Denies chest pain, palpitations, irregular heartbeat, syncope, dyspnea, diaphoresis, orthopnea, PND, claudication, edema Respiratory: denies cough, dyspnea, DOE, pleurisy, hoarseness, laryngitis, wheezing.  Gastrointestinal: Denies dysphagia, heartburn, reflux, water brash, pain, cramps, nausea, vomiting, bloating, diarrhea, constipation, hematemesis, melena, hematochezia, jaundice, hemorrhoids Genitourinary: Denies dysuria, frequency, urgency, nocturia, hesitancy, discharge, hematuria, flank pain Breast: Breast lumps, nipple discharge, bleeding.  Musculoskeletal: Denies arthralgia, myalgia, stiffness, Jt. Swelling, pain, limp, and strain/sprain. Denies falls. Skin: Denies puritis, rash, hives, warts, acne, eczema, changing in skin lesion Neuro: No weakness, tremor, incoordination, spasms, paresthesia, pain Psychiatric: Denies confusion, memory loss, sensory loss. Denies Depression. Endocrine: Denies change in weight, skin, hair change, nocturia, and paresthesia, diabetic polys, visual blurring, hyper / hypo glycemic episodes.  Heme/Lymph: No excessive bleeding, bruising, enlarged lymph nodes.  Physical Exam  BP 102/62   Pulse (!) 106   Temp (!) 97 F (36.1 C)   Ht 5' (1.524 m)   Wt 170 lb 12.8 oz (77.5 kg)   SpO2 99%   BMI 33.36 kg/m   General Appearance:  Over  nourished and  in no apparent distress.  Eyes: PERRLA, EOMs, conjunctiva no swelling or erythema, normal fundi and vessels. Sinuses: No frontal/maxillary tenderness ENT/Mouth: EACs patent / TMs  nl. Nares clear without erythema, swelling, mucoid exudates. Oral hygiene is good. No erythema, swelling, or exudate. Tongue normal, non-obstructing. Tonsils not swollen or erythematous. Hearing normal.  Neck: Supple, thyroid not palpable. No bruits, nodes or JVD. Respiratory: Respiratory effort normal.  BS equal and clear bilateral without rales, rhonci, wheezing or stridor. Cardio: Heart sounds are normal with regular rate and rhythm and no murmurs, rubs or gallops. Peripheral pulses are normal and equal bilaterally without edema. No aortic or femoral bruits. Chest: symmetric with normal excursions and percussion. Breasts: Deferred to [facility] 11/11/2021 Abdomen: Rotund, soft with bowel sounds active. Nontender, no guarding, rebound, hernias, masses, or organomegaly.  Lymphatics: Non tender without lymphadenopathy.  Musculoskeletal: Full ROM all peripheral extremities, joint stability, 5/5 strength, and normal gait. Skin: Warm and dry without rashes, lesions, cyanosis, clubbing or  ecchymosis.  Neuro: Cranial nerves intact, reflexes equal bilaterally. Normal muscle tone, no cerebellar symptoms. Sensation intact.  Pysch: Alert and oriented X 3, normal affect, Insight and Judgment appropriate.    Assessment and Plan  1. Annual Preventative Screening Examination   2. Essential hypertension  - COMPLETE METABOLIC PANEL WITH GFR - Magnesium - TSH - EKG 12-Lead - Urinalysis, Routine w reflex microscopic - Microalbumin / creatinine urine ratio  3. Hyperlipidemia, mixed  - Lipid panel - TSH - EKG 12-Lead  4. Abnormal glucose  - Hemoglobin A1c - Insulin, random - EKG 12-Lead  5. Vitamin D  deficiency  - VITAMIN D 25 Hydroxy  6. Hypothyroidism  - TSH  7. Urinary tract infection without hematuria  -  Urine Culture  8. Screening for heart disease  - EKG 12-Lead  9. FHx: heart disease  - EKG 12-Lead  10. Former smoker  - EKG 12-Lead  11. Screening for colorectal cancer  - POC Hemoccult Bld/Stl  12. 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 Aortic atherosclerosis ([place]) by Abd CT scan on 01/26/2-021  - EKG 12-Lead  14. Follicular lymphoma, unspecified grade, unspecified body region ([place])   15. Medication management  - COMPLETE METABOLIC PANEL WITH GFR - Magnesium - Lipid panel - TSH - Hemoglobin A1c - Insulin, random - VITAMIN D 25 Hydroxy            Patient was counseled in prudent diet to achieve/maintain BMI less than 25 for weight control, BP monitoring, regular exercise and medications. Discussed med's effects and SE's. Screening labs and tests as requested with regular follow-up as recommended. Over 40 minutes of exam, counseling, chart review and high complex critical decision making was performed.   Kirtland Bouchard, MD

## 2022-12-21 ENCOUNTER — Ambulatory Visit (INDEPENDENT_AMBULATORY_CARE_PROVIDER_SITE_OTHER): Payer: Medicare Other | Admitting: Internal Medicine

## 2022-12-21 ENCOUNTER — Encounter: Payer: Self-pay | Admitting: Internal Medicine

## 2022-12-21 VITALS — BP 102/62 | HR 106 | Temp 97.0°F | Ht 60.0 in | Wt 170.8 lb

## 2022-12-21 DIAGNOSIS — I10 Essential (primary) hypertension: Secondary | ICD-10-CM | POA: Diagnosis not present

## 2022-12-21 DIAGNOSIS — E559 Vitamin D deficiency, unspecified: Secondary | ICD-10-CM

## 2022-12-21 DIAGNOSIS — Z0000 Encounter for general adult medical examination without abnormal findings: Secondary | ICD-10-CM | POA: Diagnosis not present

## 2022-12-21 DIAGNOSIS — I700 Atherosclerosis of aorta: Secondary | ICD-10-CM

## 2022-12-21 DIAGNOSIS — Z1211 Encounter for screening for malignant neoplasm of colon: Secondary | ICD-10-CM

## 2022-12-21 DIAGNOSIS — Z87891 Personal history of nicotine dependence: Secondary | ICD-10-CM

## 2022-12-21 DIAGNOSIS — Z8249 Family history of ischemic heart disease and other diseases of the circulatory system: Secondary | ICD-10-CM

## 2022-12-21 DIAGNOSIS — C8290 Follicular lymphoma, unspecified, unspecified site: Secondary | ICD-10-CM

## 2022-12-21 DIAGNOSIS — Z79899 Other long term (current) drug therapy: Secondary | ICD-10-CM

## 2022-12-21 DIAGNOSIS — E782 Mixed hyperlipidemia: Secondary | ICD-10-CM

## 2022-12-21 DIAGNOSIS — C3490 Malignant neoplasm of unspecified part of unspecified bronchus or lung: Secondary | ICD-10-CM

## 2022-12-21 DIAGNOSIS — E039 Hypothyroidism, unspecified: Secondary | ICD-10-CM

## 2022-12-21 DIAGNOSIS — Z136 Encounter for screening for cardiovascular disorders: Secondary | ICD-10-CM | POA: Diagnosis not present

## 2022-12-21 DIAGNOSIS — N390 Urinary tract infection, site not specified: Secondary | ICD-10-CM

## 2022-12-21 DIAGNOSIS — R7309 Other abnormal glucose: Secondary | ICD-10-CM

## 2022-12-21 DIAGNOSIS — Z0001 Encounter for general adult medical examination with abnormal findings: Secondary | ICD-10-CM

## 2022-12-22 LAB — HEMOGLOBIN A1C
Hgb A1c MFr Bld: 6 % of total Hgb — ABNORMAL HIGH (ref <5.7)
Mean Plasma Glucose: 126 mg/dL
eAG (mmol/L): 7 mmol/L

## 2022-12-22 LAB — URINALYSIS, ROUTINE W REFLEX MICROSCOPIC
Bacteria, UA: NONE SEEN /HPF
Bilirubin Urine: NEGATIVE
Glucose, UA: NEGATIVE
Hgb urine dipstick: NEGATIVE
Hyaline Cast: NONE SEEN /LPF
Ketones, ur: NEGATIVE
Nitrite: NEGATIVE
Protein, ur: NEGATIVE
RBC / HPF: NONE SEEN /HPF (ref 0–2)
Specific Gravity, Urine: 1.012 (ref 1.001–1.035)
Squamous Epithelial / HPF: NONE SEEN /HPF (ref <=5)
pH: 5.5 (ref 5.0–8.0)

## 2022-12-22 LAB — MICROALBUMIN / CREATININE URINE RATIO
Creatinine, Urine: 65 mg/dL (ref 20–275)
Microalb Creat Ratio: 8 mcg/mg creat (ref <30)
Microalb, Ur: 0.5 mg/dL

## 2022-12-22 LAB — COMPLETE METABOLIC PANEL WITH GFR
AG Ratio: 1.3 (calc) (ref 1.0–2.5)
ALT: 26 U/L (ref 6–29)
AST: 24 U/L (ref 10–35)
Albumin: 3.8 g/dL (ref 3.6–5.1)
Alkaline phosphatase (APISO): 75 U/L (ref 37–153)
BUN: 19 mg/dL (ref 7–25)
CO2: 27 mmol/L (ref 20–32)
Calcium: 9.1 mg/dL (ref 8.6–10.4)
Chloride: 102 mmol/L (ref 98–110)
Creat: 0.95 mg/dL (ref 0.60–1.00)
Globulin: 2.9 g/dL (calc) (ref 1.9–3.7)
Glucose, Bld: 60 mg/dL — ABNORMAL LOW (ref 65–99)
Potassium: 4.2 mmol/L (ref 3.5–5.3)
Sodium: 139 mmol/L (ref 135–146)
Total Bilirubin: 0.8 mg/dL (ref 0.2–1.2)
Total Protein: 6.7 g/dL (ref 6.1–8.1)
eGFR: 62 mL/min/{1.73_m2} (ref >=60)

## 2022-12-22 LAB — MAGNESIUM: Magnesium: 2.1 mg/dL (ref 1.5–2.5)

## 2022-12-22 LAB — LIPID PANEL
Cholesterol: 168 mg/dL (ref <200)
HDL: 60 mg/dL (ref >=50)
LDL Cholesterol (Calc): 87 mg/dL (calc)
Non-HDL Cholesterol (Calc): 108 mg/dL (calc) (ref <130)
Total CHOL/HDL Ratio: 2.8 (calc) (ref <5.0)
Triglycerides: 118 mg/dL (ref <150)

## 2022-12-22 LAB — URINE CULTURE
MICRO NUMBER:: 14632938
SPECIMEN QUALITY:: ADEQUATE

## 2022-12-22 LAB — INSULIN, RANDOM: Insulin: 49.6 u[IU]/mL — ABNORMAL HIGH

## 2022-12-22 LAB — VITAMIN D 25 HYDROXY (VIT D DEFICIENCY, FRACTURES): Vit D, 25-Hydroxy: 80 ng/mL (ref 30–100)

## 2022-12-22 LAB — TSH: TSH: 5.75 mIU/L — ABNORMAL HIGH (ref 0.40–4.50)

## 2022-12-22 LAB — MICROSCOPIC MESSAGE

## 2022-12-22 NOTE — Progress Notes (Signed)
<><><><><><><><><><><><><><><><><><><><><><><><><><><><><><><><><> <><><><><><><><><><><><><><><><><><><><><><><><><><><><><><><><><> - Test results slightly outside the reference range are not unusual. If there is anything important, I will review this with you,  otherwise it is considered normal test values.  If you have further questions,  please do not hesitate to contact me at the office or via My Chart.  <><><><><><><><><><><><><><><><><><><><><><><><><><><><><><><><><> <><><><><><><><><><><><><><><><><><><><><><><><><><><><><><><><><>  -  TSH is slightly elevated which suggests thyroid hormone may be slightly low,                                                                                                           but not enough to adjust dose   - Also , please be sure To take  on an empty stomach with  only water for 30 minutes &  no Antacid meds,                     No Calcium or Magnesium for 4 hours &                                                                                Most Importantly -avoid Biotin  !  <><><><><><><><><><><><><><><><><><><><><><><><><><><><><><><><><>  -  A1c = 6.0% - Sl elevated -   So   - Avoid Sweets, Candy & White Stuff   - White Rice, White Potatoes, White Flour  - Breads &  Pasta <><><><><><><><><><><><><><><><><><><><><><><><><><><><><><><><><>  -  Chol = 168   &  LDL Chol = 87   - Both  Excellent   - Very low risk for Heart Attack  /  Stroke <><><><><><><><><><><><><><><><><><><><><><><><><><><><><><><><><>  -  Vitamin D = 80 - Excellent - Please keep dose same  <><><><><><><><><><><><><><><><><><><><><><><><><><><><><><><><><>  -  Urine culture - OK - No Infection              <><><><><><><><><><><><><><><><><><><><><><><><><><><><><><><><><>  -  All Else - CBC - Kidneys - Electrolytes - Liver - Magnesium & Thyroid    - all  Normal / OK <><><><><><><><><><><><><><><><><><><><><><><><><><><><><><><><><> <><><><><><><><><><><><><><><><><><><><><><><><><><><><><><><><><>

## 2022-12-24 ENCOUNTER — Encounter: Payer: Self-pay | Admitting: Internal Medicine

## 2023-01-08 ENCOUNTER — Other Ambulatory Visit (HOSPITAL_COMMUNITY): Payer: Self-pay

## 2023-01-08 ENCOUNTER — Other Ambulatory Visit: Payer: Self-pay | Admitting: Internal Medicine

## 2023-01-08 ENCOUNTER — Other Ambulatory Visit: Payer: Self-pay

## 2023-01-08 MED ORDER — AFATINIB DIMALEATE 30 MG PO TABS
ORAL_TABLET | ORAL | 2 refills | Status: DC
  Filled 2023-01-08: qty 30, 30d supply, fill #0
  Filled 2023-02-06: qty 30, 30d supply, fill #1
  Filled 2023-03-01: qty 30, 30d supply, fill #2

## 2023-01-09 ENCOUNTER — Other Ambulatory Visit: Payer: Self-pay

## 2023-01-11 ENCOUNTER — Other Ambulatory Visit (HOSPITAL_COMMUNITY): Payer: Self-pay

## 2023-02-05 ENCOUNTER — Encounter: Payer: Self-pay | Admitting: Internal Medicine

## 2023-02-05 ENCOUNTER — Other Ambulatory Visit: Payer: Self-pay | Admitting: Internal Medicine

## 2023-02-05 DIAGNOSIS — R059 Cough, unspecified: Secondary | ICD-10-CM

## 2023-02-05 MED ORDER — BENZONATATE 200 MG PO CAPS: ORAL_CAPSULE | ORAL | 1 refills | Status: DC

## 2023-02-06 ENCOUNTER — Other Ambulatory Visit (HOSPITAL_COMMUNITY): Payer: Self-pay

## 2023-02-08 ENCOUNTER — Telehealth: Payer: Self-pay | Admitting: Internal Medicine

## 2023-02-08 ENCOUNTER — Inpatient Hospital Stay: Payer: Medicare Other

## 2023-02-08 ENCOUNTER — Other Ambulatory Visit: Payer: Self-pay

## 2023-02-08 ENCOUNTER — Ambulatory Visit (HOSPITAL_COMMUNITY): Payer: Medicare Other

## 2023-02-08 NOTE — Telephone Encounter (Signed)
Patient called to move her lab and follow up around her CT scan date. Patient aware of date and time of appointments.

## 2023-02-11 ENCOUNTER — Other Ambulatory Visit: Payer: Self-pay | Admitting: Internal Medicine

## 2023-02-11 DIAGNOSIS — E039 Hypothyroidism, unspecified: Secondary | ICD-10-CM

## 2023-02-11 MED ORDER — LEVOTHYROXINE SODIUM 50 MCG PO TABS: ORAL_TABLET | ORAL | 3 refills | Status: DC

## 2023-02-12 ENCOUNTER — Inpatient Hospital Stay: Payer: Medicare Other | Admitting: Internal Medicine

## 2023-02-19 ENCOUNTER — Encounter: Payer: Self-pay | Admitting: Nurse Practitioner

## 2023-02-19 ENCOUNTER — Ambulatory Visit (INDEPENDENT_AMBULATORY_CARE_PROVIDER_SITE_OTHER): Payer: Medicare Other | Admitting: Nurse Practitioner

## 2023-02-19 VITALS — BP 130/74 | HR 90 | Temp 97.9°F | Ht 60.0 in | Wt 170.0 lb

## 2023-02-19 DIAGNOSIS — I10 Essential (primary) hypertension: Secondary | ICD-10-CM

## 2023-02-19 DIAGNOSIS — R8290 Unspecified abnormal findings in urine: Secondary | ICD-10-CM

## 2023-02-19 NOTE — Patient Instructions (Signed)
Urinary Tract Infection, Adult  A urinary tract infection (UTI) is an infection of any part of the urinary tract. The urinary tract includes the kidneys, ureters, bladder, and urethra. These organs make, store, and get rid of urine in the body. An upper UTI affects the ureters and kidneys. A lower UTI affects the bladder and urethra. What are the causes? Most urinary tract infections are caused by bacteria in your genital area around your urethra, where urine leaves your body. These bacteria grow and cause inflammation of your urinary tract. What increases the risk? You are more likely to develop this condition if: You have a urinary catheter that stays in place. You are not able to control when you urinate or have a bowel movement (incontinence). You are female and you: Use a spermicide or diaphragm for birth control. Have low estrogen levels. Are pregnant. You have certain genes that increase your risk. You are sexually active. You take antibiotic medicines. You have a condition that causes your flow of urine to slow down, such as: An enlarged prostate, if you are female. Blockage in your urethra. A kidney stone. A nerve condition that affects your bladder control (neurogenic bladder). Not getting enough to drink, or not urinating often. You have certain medical conditions, such as: Diabetes. A weak disease-fighting system (immunesystem). Sickle cell disease. Gout. Spinal cord injury. What are the signs or symptoms? Symptoms of this condition include: Needing to urinate right away (urgency). Frequent urination. This may include small amounts of urine each time you urinate. Pain or burning with urination. Blood in the urine. Urine that smells bad or unusual. Trouble urinating. Cloudy urine. Vaginal discharge, if you are female. Pain in the abdomen or the lower back. You may also have: Vomiting or a decreased appetite. Confusion. Irritability or tiredness. A fever or  chills. Diarrhea. The first symptom in older adults may be confusion. In some cases, they may not have any symptoms until the infection has worsened. How is this diagnosed? This condition is diagnosed based on your medical history and a physical exam. You may also have other tests, including: Urine tests. Blood tests. Tests for STIs (sexually transmitted infections). If you have had more than one UTI, a cystoscopy or imaging studies may be done to determine the cause of the infections. How is this treated? Treatment for this condition includes: Antibiotic medicine. Over-the-counter medicines to treat discomfort. Drinking enough water to stay hydrated. If you have frequent infections or have other conditions such as a kidney stone, you may need to see a health care provider who specializes in the urinary tract (urologist). In rare cases, urinary tract infections can cause sepsis. Sepsis is a life-threatening condition that occurs when the body responds to an infection. Sepsis is treated in the hospital with IV antibiotics, fluids, and other medicines. Follow these instructions at home:  Medicines Take over-the-counter and prescription medicines only as told by your health care provider. If you were prescribed an antibiotic medicine, take it as told by your health care provider. Do not stop using the antibiotic even if you start to feel better. General instructions Make sure you: Empty your bladder often and completely. Do not hold urine for long periods of time. Empty your bladder after sex. Wipe from front to back after urinating or having a bowel movement if you are female. Use each tissue only one time when you wipe. Drink enough fluid to keep your urine pale yellow. Keep all follow-up visits. This is important. Contact a health   care provider if: Your symptoms do not get better after 1-2 days. Your symptoms go away and then return. Get help right away if: You have severe pain in  your back or your lower abdomen. You have a fever or chills. You have nausea or vomiting. Summary A urinary tract infection (UTI) is an infection of any part of the urinary tract, which includes the kidneys, ureters, bladder, and urethra. Most urinary tract infections are caused by bacteria in your genital area. Treatment for this condition often includes antibiotic medicines. If you were prescribed an antibiotic medicine, take it as told by your health care provider. Do not stop using the antibiotic even if you start to feel better. Keep all follow-up visits. This is important. This information is not intended to replace advice given to you by your health care provider. Make sure you discuss any questions you have with your health care provider. Document Revised: 05/21/2020 Document Reviewed: 05/21/2020 Elsevier Patient Education  2023 Elsevier Inc.  

## 2023-02-19 NOTE — Progress Notes (Signed)
Assessment and Plan:  Elizabeth [name] was seen today for urinary tract infection.  Diagnoses and all orders for this visit:  Essential hypertension - continue medications, DASH diet, exercise and monitor at home. Call if greater than 130/80.   Urine abnormality Push fluids Will await treatment with lab results If develops fever, back pain or abdominal pain notify the office -     Urinalysis, Routine w reflex microscopic -     Urine Culture       Further disposition pending results of labs. Discussed med's effects and SE's.   Over 30 minutes of exam, counseling, chart review, and critical decision making was performed.   Future Appointments  Date Time Provider Department Center  03/01/2023 11:00 AM [facility] None  03/01/2023 12:30 PM [facility] [place]  03/05/2023  9:30 AM Heilingoetter, Cassandra L, PA-C [facility] None  04/20/2023 11:00 AM Raynelle Dick, NP [facility] None  07/23/2023 11:30 AM Lucky Cowboy, MD [facility] None  10/22/2023 11:00 AM Raynelle Dick, NP [facility] None  01/21/2024 11:00 AM Lucky Cowboy, MD [facility] None    ------------------------------------------------------------------------------------------------------------------   HPI BP 130/74   Pulse 90   Temp 97.9 F (36.6 C)   Ht 5' (1.524 m)   Wt 170 lb (77.1 kg)   SpO2 95%   BMI 33.20 kg/m    77 y.o.female presents for discolored urine that is cloudy white which has been present for 4-5 days.  Denies pain dysuria, back pain, blood in urine. In past she had previous symptoms   She was seen at urgent care 10 days ago for sinus infection and was started on Doxycycline x 6 days but she stopped because it made her sick to her stomach.  She still has a mild cough and is using Delsym over the counter.  Currently on Losartan 50 mg QD for BP control.  Denies headaches, chest pain, shortness of breath and dizziness BP Readings from Last 3 Encounters:  02/19/23 130/74   12/21/22 102/62  11/13/22 (!) 156/78     Past Medical History:  Diagnosis Date   Adenocarcinoma of left lung, stage 4 ([facility]) 04/05/2015   Biopsy confirmed CT SCAN: There are innumerable bilateral pulmonary nodules. These range in size from about 5 mm to 2 cm. They demonstrate irregular indistinct borders, the larger ones demonstrating spiculation. PET SCAN: Diffuse pulmonary metastatic disease with a 4 cm dominant left lower low lung lesion. No enlarged or hypermetabolic mediastinal or hilar adenopathy.   Allergy    Anxiety    Cancer ([facility])    Change of skin related to chemotherapy 09/25/2017   Drug-induced skin rash 01/17/2016   History of radiation therapy 12/18/2019   SBRT left lung  12/11/2019-12/18/2019   Dr Antony Blackbird   History of radiation therapy 06/21/2021   left lung 06/14/2021-06/21/2021 Dr Antony Blackbird   Hyperlipidemia    Hypertension    Hypothyroidism    Insomnia    Prediabetes    Thyroid disease      Allergies  Allergen Reactions   Penicillins Swelling and Rash    Current Outpatient Medications on File Prior to Visit  Medication Sig   afatinib dimaleate (GILOTRIF) 30 MG tablet TAKE 1 TABLET (30MG ) BY MOUTH ONCE DAILY. TAKE ON AN EMPTY STOMACH 1 HOUR BEFORE OR 2 HOURS AFTER A MEAL   ALPRAZolam (XANAX) 1 MG tablet Take 1 mg by mouth at bedtime.   aspirin 81 MG tablet Take 81 mg by mouth at bedtime.  Dextromethorphan HBr (DELSYM PO) Take by mouth.   ezetimibe (ZETIA) 10 MG tablet Take 1 tablet (10 mg total) by mouth daily.   gabapentin (NEURONTIN) 600 MG tablet Take 1/2 to 1 tablet 2 to 3 x / Daily as needed for Pain   ibuprofen (ADVIL,MOTRIN) 200 MG tablet Take 400 mg by mouth every 6 (six) hours as needed for headache or moderate pain.   levothyroxine (SYNTHROID) 50 MCG tablet Take  1 tablet  Daily  on an empty stomach with only water for 30 minutes & no Antacid meds, Calcium or Magnesium for 4 hours & avoid Biotin                                             /                        TAKE                         BY            MOUTH   losartan (COZAAR) 50 MG tablet Take 1 tablet (50 mg total) by mouth daily.   montelukast (SINGULAIR) 10 MG tablet Take one tablet for allergies   benzonatate (TESSALON) 200 MG capsule Take 1 perle 3 x / day to prevent cough (Patient not taking: Reported on 02/19/2023)   MAGNESIUM CITRATE PO Take 500 mg by mouth. (Patient not taking: Reported on 02/19/2023)   No current facility-administered medications on file prior to visit.    ROS: all negative except above.   Physical Exam:  BP 130/74   Pulse 90   Temp 97.9 F (36.6 C)   Ht 5' (1.524 m)   Wt 170 lb (77.1 kg)   SpO2 95%   BMI 33.20 kg/m   General Appearance: Well nourished, in no apparent distress. Eyes: PERRLA, EOMs, conjunctiva no swelling or erythema Sinuses: No Frontal/maxillary tenderness ENT/Mouth: Ext aud canals clear, TMs without erythema, bulging. No erythema, swelling, or exudate on post pharynx.  Tonsils not swollen or erythematous. Hearing normal.  Neck: Supple, thyroid normal.  Respiratory: Respiratory effort normal, BS equal bilaterally without rales, rhonchi, wheezing or stridor.  Cardio: RRR with no MRGs. Brisk peripheral pulses without edema.  Abdomen: Soft, + BS.   Lymphatics: Non tender without lymphadenopathy.  Musculoskeletal: Full ROM, 5/5 strength, normal gait. No CVA tenderness Skin: Warm, dry without rashes, lesions, ecchymosis.  Neuro: Cranial nerves intact. Normal muscle tone, no cerebellar symptoms. Sensation intact.  Psych: Awake and oriented X 3, normal affect, Insight and Judgment appropriate.     Raynelle Dick, NP 3:01 PM [facility]

## 2023-02-20 ENCOUNTER — Other Ambulatory Visit: Payer: Self-pay | Admitting: Nurse Practitioner

## 2023-02-20 DIAGNOSIS — R8290 Unspecified abnormal findings in urine: Secondary | ICD-10-CM

## 2023-02-20 MED ORDER — NITROFURANTOIN MONOHYD MACRO 100 MG PO CAPS: 100.0000 mg | ORAL_CAPSULE | Freq: Two times a day (BID) | ORAL | 0 refills | Status: AC

## 2023-02-21 LAB — URINALYSIS, ROUTINE W REFLEX MICROSCOPIC
Bacteria, UA: NONE SEEN /HPF
Bilirubin Urine: NEGATIVE
Glucose, UA: NEGATIVE
Hyaline Cast: NONE SEEN /LPF
Ketones, ur: NEGATIVE
Nitrite: NEGATIVE
Protein, ur: NEGATIVE
RBC / HPF: NONE SEEN /HPF (ref 0–2)
Specific Gravity, Urine: 1.008 (ref 1.001–1.035)
Squamous Epithelial / HPF: NONE SEEN /HPF (ref <=5)
WBC, UA: >=60 /HPF — AB (ref 0–5)
pH: 5.5 (ref 5.0–8.0)

## 2023-02-21 LAB — URINE CULTURE
MICRO NUMBER:: 14887873
SPECIMEN QUALITY:: ADEQUATE

## 2023-02-26 NOTE — Progress Notes (Signed)
[facility] OFFICE PROGRESS NOTE  Lucky Cowboy, MD [address] [place] [address]  DIAGNOSIS: Stage IV (T2a, N0, M1a) non-small cell lung cancer, adenocarcinoma with positive EGFR mutation with deletion in exon 19 diagnosed in June 2016 and presented with large mass in the left lower lobe in addition to multiple bilateral pulmonary nodules   PRIOR THERAPY: 1) Gilotrif 40 mg by mouth daily started 05/11/2015, status post 9 months of treatment discontinued on 02/17/2016 secondary to extensive skin rash. 2) SBRT to the enlarging left upper lobe nodule under the care of Dr. Mitzi Hansen.  CURRENT THERAPY:  Gilotrif 30 mg by mouth daily started 02/21/2016 status post 84.5 months of treatment.   INTERVAL HISTORY: Elizabeth [name] 77 y.o. female returns to the clinic today for a follow-up visit.  She was last seen by Dr. Arbutus Ped in January 2024. 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 With her last CT scan, it showed slightly enlarging right lower lobe pulmonary nodule as well as soft tissue nodule in the posterior pararenal space which could be related to the lymphoma vs lung cancer. If she has disease progression, Dr. Arbutus Ped would recommend a PET scan and probable rebiopsy to rule out resistant mutation.   Today, she returns to the clinic for a follow up visit. The patient is feeling well today without any concerning complaints except she had a viral infection last month and is only just beginning to recover.  She tested negative for COVID.  She reports she had fevers, chills, sore throat, and cough.   The patient continues to tolerate treatment with targeted treatment with Giotrif well without any adverse effects.  Denies any night sweats or weight loss.  She is considering starting water aerobics.  She denies any chest pain except for occasional rib pain from her left rib fractures.  Denies any hemoptysis.  Denies any significant  change in her baseline dyspnea on exertion.  Denies any nausea, vomiting, or constipation. She sometimes has 1 episode of diarrhea per day. Denies any headache or visual changes except she was told she recently has mild cataracts. Denies any rashes or skin changes. She recently had a restaging CT scan. The patient is here today for evaluation and to review her scan results.   MEDICAL HISTORY: Past Medical History:  Diagnosis Date   Adenocarcinoma of left lung, stage 4 (HCC) 04/05/2015   Biopsy confirmed CT SCAN: There are innumerable bilateral pulmonary nodules. These range in size from about 5 mm to 2 cm. They demonstrate irregular indistinct borders, the larger ones demonstrating spiculation. PET SCAN: Diffuse pulmonary metastatic disease with a 4 cm dominant left lower low lung lesion. No enlarged or hypermetabolic mediastinal or hilar adenopathy.   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  MEDICATIONS:  Current Outpatient Medications  Medication Sig Dispense Refill   afatinib dimaleate (GILOTRIF) 30 MG tablet TAKE 1 TABLET (30MG )  BY MOUTH ONCE DAILY. TAKE ON AN EMPTY STOMACH 1 HOUR BEFORE OR 2 HOURS AFTER A MEAL 30 tablet 2   ALPRAZolam (XANAX) 1 MG tablet Take 1 mg by mouth at bedtime.     aspirin 81 MG tablet Take 81 mg by mouth at bedtime.      ezetimibe (ZETIA) 10 MG tablet Take 1 tablet (10 mg total) by mouth daily. 90 tablet 3   gabapentin (NEURONTIN) 600 MG tablet Take 1/2 to 1 tablet 2 to 3 x / Daily as needed for Pain  270 tablet 1   ibuprofen (ADVIL,MOTRIN) 200 MG tablet Take 400 mg by mouth every 6 (six) hours as needed for headache or moderate pain.     levothyroxine (SYNTHROID) 50 MCG tablet Take  1 tablet  Daily  on an empty stomach with only water for 30 minutes & no Antacid meds, Calcium or Magnesium for 4 hours & avoid Biotin                                             /                       TAKE                         BY            MOUTH 90 tablet 3   losartan (COZAAR) 50 MG tablet Take 1 tablet (50 mg total) by mouth daily. 30 tablet 11   montelukast (SINGULAIR) 10 MG tablet Take one tablet for allergies 90 tablet 3   No current facility-administered medications for this visit.    SURGICAL HISTORY:  Past Surgical History:  Procedure Laterality Date   ABDOMINAL HYSTERECTOMY  10/23/1993   w BSO   CYST REMOVAL HAND Right    EXCISION MASS ABDOMINAL N/A 08/11/2021   Procedure: EXCISIONAL BIOPSY OF LEFT RETROPERITONEAL MASS, POSSIBLE INTRAOPERATIVE LAPAROSCOPIC ULTRASOUND;  Surgeon: Karie Soda, MD;  Location: [facility];  Service: General;  Laterality: N/A;   LAPAROSCOPY N/A 08/11/2021   Procedure: LAPAROSCOPIC EXPLORATION;  Surgeon: Karie Soda, MD;  Location: [facility];  Service: General;  Laterality: N/A;   TONSILLECTOMY AND ADENOIDECTOMY      REVIEW OF SYSTEMS:   Review of Systems  Constitutional: Negative for appetite change, chills (none at this time), fatigue, fever (resolved) and unexpected weight change.  HENT: Negative for mouth sores, nosebleeds, sore throat (resolved) and trouble swallowing.   Eyes: Negative for eye problems and icterus.  Respiratory: Improving cough and dyspnea on exertion.  Negative for hemoptysis, and wheezing.   Cardiovascular: Positive for occasional rib pain.  Negative for leg swelling.  Gastrointestinal: Positive for diarrhea x1 per day. Negative for abdominal pain, constipation, nausea and vomiting.  Genitourinary: Negative for bladder incontinence, difficulty  urinating, dysuria, frequency and hematuria.   Musculoskeletal: Positive for chronic back pain and knee pain. Negative for gait problem, neck pain and neck stiffness.  Skin: Negative for itching and rash.  Neurological: Negative for dizziness, extremity weakness, gait problem, headaches, light-headedness and seizures.  Hematological: Negative for adenopathy. Does not bruise/bleed easily.  Psychiatric/Behavioral: Negative for confusion, depression and sleep disturbance. The patient is not nervous/anxious.     PHYSICAL EXAMINATION:  Blood pressure 137/82, pulse 88, temperature 97.7 F (36.5 C), temperature source Oral, resp. rate  17, weight 170 lb (77.1 kg), SpO2 96 %.  ECOG PERFORMANCE STATUS: 1  Physical Exam  Constitutional: Oriented to person, place, and time and well-developed, well-nourished, and in no distress.  HENT:  Head: Normocephalic and atraumatic.  Mouth/Throat: Oropharynx is clear and moist. No oropharyngeal exudate.  Eyes: Conjunctivae are normal. Right eye exhibits no discharge. Left eye exhibits no discharge. No scleral icterus.  Neck: Normal range of motion. Neck supple.  Cardiovascular: Normal rate, regular rhythm, normal heart sounds and intact distal pulses.   Pulmonary/Chest: Effort normal and breath sounds normal. No respiratory distress. No wheezes. No rales.  Abdominal: Soft. Bowel sounds are normal. Exhibits no distension and no mass. There is no tenderness.  Musculoskeletal: Normal range of motion. Exhibits no edema.  Lymphadenopathy:    No cervical adenopathy.  Neurological: Alert and oriented to person, place, and time. Exhibits normal muscle tone. Gait normal. Coordination normal.  Skin: Skin is warm and dry. No rash noted. Not diaphoretic. No erythema. No pallor.  Psychiatric: Mood, memory and judgment normal.  Vitals reviewed.  LABORATORY DATA: Lab Results  Component Value Date   WBC 5.7 03/01/2023   HGB 11.9 (L) 03/01/2023   HCT 37.0 03/01/2023    MCV 86.0 03/01/2023   PLT 227 03/01/2023      Chemistry      Component Value Date/Time   NA 142 03/01/2023 1020   NA 139 10/26/2017 1252   K 4.2 03/01/2023 1020   K 4.1 10/26/2017 1252   CL 108 03/01/2023 1020   CO2 28 03/01/2023 1020   CO2 28 10/26/2017 1252   BUN 16 03/01/2023 1020   BUN 15.7 10/26/2017 1252   CREATININE 0.90 03/01/2023 1020   CREATININE 0.95 12/21/2022 1057   CREATININE 1.0 10/26/2017 1252      Component Value Date/Time   CALCIUM 8.9 03/01/2023 1020   CALCIUM 8.8 10/26/2017 1252   ALKPHOS 72 03/01/2023 1020   ALKPHOS 70 10/26/2017 1252   AST 16 03/01/2023 1020   AST 19 10/26/2017 1252   ALT 13 03/01/2023 1020   ALT 22 10/26/2017 1252   BILITOT 0.9 03/01/2023 1020   BILITOT 0.90 10/26/2017 1252       RADIOGRAPHIC STUDIES:  CT Chest W Contrast  Result Date: 03/05/2023 CLINICAL DATA:  Restaging non-small cell lung cancer. * Tracking Code: BO *. EXAM: CT CHEST, ABDOMEN, AND PELVIS WITH CONTRAST TECHNIQUE: Multidetector CT imaging of the chest, abdomen and pelvis was performed following the standard protocol during bolus administration of intravenous contrast. RADIATION DOSE REDUCTION: This exam was performed according to the departmental dose-optimization program which includes automated exposure control, adjustment of the mA and/or kV according to patient size and/or use of iterative reconstruction technique. CONTRAST:  OMNIPAQUE IOHEXOL 300 MG/ML  SOLN COMPARISON:  11/09/2022 FINDINGS: CT CHEST FINDINGS Cardiovascular: Heart size appears normal. No pericardial effusion. Mild aortic atherosclerosis. Mediastinum/Nodes: Thyroid gland, trachea and esophagus are unremarkable. No enlarged mediastinal, hilar, or axillary lymph nodes. Lungs/Pleura: No pleural effusion. Similar appearance of geographically distributed, bandlike area of fibrosis and architectural distortion within the left upper lobe compatible with changes due to external beam radiation. Masslike  consolidation within the left midlung is also unchanged in the interval, image 73/4. Scattered lung nodules are again seen including: - -stable irregular part solid nodule in the right lower lobe measuring 0.9 x 0.6 cm, image 75/4. -Index nodule in the right upper lobe measures 0.9 x 0.8 cm, image 35/4. Previously this measured the same. Index solid nodule  within the posterior right upper lobe is unchanged measuring 7 mm, image 48/4. Index nodule within the right lower lobe measures 0.9 x 1.0 cm, image 79/4. Previously this measured 0.8 x 0.9 cm. On the exam from 07/13/2022 this measured 0.7 x 0.7 cm. Subpleural nodule within the left apex measures 1.5 cm, image 26/4. Previously 1.3 cm. On the exam from 03/14/2022 this measured 1 cm. Musculoskeletal: Fracture deformity involving the lateral aspect of the left third and fourth ribs appears similar to the previous exam. No suspicious bone lesions identified. CT ABDOMEN PELVIS FINDINGS Hepatobiliary: No suspicious liver lesions. Posterior right lobe of liver hemangioma. Gallbladder appears normal. No bile duct dilatation. Pancreas: Unremarkable. No pancreatic ductal dilatation or surrounding inflammatory changes. Spleen: Normal in size without focal abnormality. Adrenals/Urinary Tract: Adrenal glands are unremarkable. Kidneys are normal, without renal calculi, focal lesion, or hydronephrosis. Bladder is unremarkable. Stomach/Bowel: Stomach is within normal limits. Appendix appears normal. No evidence of bowel wall thickening, distention, or inflammatory changes. Vascular/Lymphatic: Aortic atherosclerosis. No aneurysm. No signs of abdominopelvic adenopathy. Reproductive: Status post hysterectomy. No adnexal masses. Other: Right retroperitoneal soft tissue nodule posterior to the kidney measures 1.5 by 1.2 cm, image 69/2. Previously this measured 1.4 x 1.3 cm. Along the right pericolic gutter there is a soft tissue nodule measuring 0.9 cm, image 78/2. Stable from  previous exam. Soft tissue nodule along the left peritoneal reflection measures 0.5 cm, image 79/2. Unchanged from previous exam. No ascites identified. No focal fluid collections. Musculoskeletal: No acute or significant osseous findings. Anterolisthesis of L4 on L5 noted. Moderate to marked L5-S1 degenerative disc disease. IMPRESSION: 1. Continued gradual increase in size of nodules within the medial left apex and within the right lower lobe. These remain suspicious for underlying neoplasm. 2. Nodule within the right retroperitoneal space and the tube peritoneal nodules are stable in the interval. These remain suspicious for metastatic disease. 3. Stable appearance of post treatment change within the left upper lobe and left midlung. 4. Unchanged appearance of left rib fractures. 5.  Aortic Atherosclerosis (ICD10-I70.0). Electronically Signed   By: Signa Kell M.D.   On: 03/05/2023 09:25   CT Abdomen Pelvis W Contrast  Result Date: 03/05/2023 CLINICAL DATA:  Restaging non-small cell lung cancer. * Tracking Code: BO *. EXAM: CT CHEST, ABDOMEN, AND PELVIS WITH CONTRAST TECHNIQUE: Multidetector CT imaging of the chest, abdomen and pelvis was performed following the standard protocol during bolus administration of intravenous contrast. RADIATION DOSE REDUCTION: This exam was performed according to the departmental dose-optimization program which includes automated exposure control, adjustment of the mA and/or kV according to patient size and/or use of iterative reconstruction technique. CONTRAST:  OMNIPAQUE IOHEXOL 300 MG/ML  SOLN COMPARISON:  11/09/2022 FINDINGS: CT CHEST FINDINGS Cardiovascular: Heart size appears normal. No pericardial effusion. Mild aortic atherosclerosis. Mediastinum/Nodes: Thyroid gland, trachea and esophagus are unremarkable. No enlarged mediastinal, hilar, or axillary lymph nodes. Lungs/Pleura: No pleural effusion. Similar appearance of geographically distributed, bandlike area of  fibrosis and architectural distortion within the left upper lobe compatible with changes due to external beam radiation. Masslike consolidation within the left midlung is also unchanged in the interval, image 73/4. Scattered lung nodules are again seen including: - -stable irregular part solid nodule in the right lower lobe measuring 0.9 x 0.6 cm, image 75/4. -Index nodule in the right upper lobe measures 0.9 x 0.8 cm, image 35/4. Previously this measured the same. Index solid nodule within the posterior right upper lobe is unchanged measuring 7 mm, image  48/4. Index nodule within the right lower lobe measures 0.9 x 1.0 cm, image 79/4. Previously this measured 0.8 x 0.9 cm. On the exam from 07/13/2022 this measured 0.7 x 0.7 cm. Subpleural nodule within the left apex measures 1.5 cm, image 26/4. Previously 1.3 cm. On the exam from 03/14/2022 this measured 1 cm. Musculoskeletal: Fracture deformity involving the lateral aspect of the left third and fourth ribs appears similar to the previous exam. No suspicious bone lesions identified. CT ABDOMEN PELVIS FINDINGS Hepatobiliary: No suspicious liver lesions. Posterior right lobe of liver hemangioma. Gallbladder appears normal. No bile duct dilatation. Pancreas: Unremarkable. No pancreatic ductal dilatation or surrounding inflammatory changes. Spleen: Normal in size without focal abnormality. Adrenals/Urinary Tract: Adrenal glands are unremarkable. Kidneys are normal, without renal calculi, focal lesion, or hydronephrosis. Bladder is unremarkable. Stomach/Bowel: Stomach is within normal limits. Appendix appears normal. No evidence of bowel wall thickening, distention, or inflammatory changes. Vascular/Lymphatic: Aortic atherosclerosis. No aneurysm. No signs of abdominopelvic adenopathy. Reproductive: Status post hysterectomy. No adnexal masses. Other: Right retroperitoneal soft tissue nodule posterior to the kidney measures 1.5 by 1.2 cm, image 69/2. Previously this  measured 1.4 x 1.3 cm. Along the right pericolic gutter there is a soft tissue nodule measuring 0.9 cm, image 78/2. Stable from previous exam. Soft tissue nodule along the left peritoneal reflection measures 0.5 cm, image 79/2. Unchanged from previous exam. No ascites identified. No focal fluid collections. Musculoskeletal: No acute or significant osseous findings. Anterolisthesis of L4 on L5 noted. Moderate to marked L5-S1 degenerative disc disease. IMPRESSION: 1. Continued gradual increase in size of nodules within the medial left apex and within the right lower lobe. These remain suspicious for underlying neoplasm. 2. Nodule within the right retroperitoneal space and the tube peritoneal nodules are stable in the interval. These remain suspicious for metastatic disease. 3. Stable appearance of post treatment change within the left upper lobe and left midlung. 4. Unchanged appearance of left rib fractures. 5.  Aortic Atherosclerosis (ICD10-I70.0). Electronically Signed   By: Signa Kell M.D.   On: 03/05/2023 09:25     ASSESSMENT/PLAN:  This is a very pleasant 77 year old Caucasian female with stage IV non-small cell lung cancer, adenocarcinoma with positive EGFR mutation with deletion in exon 19 and currently undergoing treatment with Gilotrif initially at a dose of 40 mg for 9 months followed by 30 mg by mouth daily status post 84.5 months.  She also underwent SBRT to the enlarging left upper lobe lung nodule.   Her PET scan showed hypermetabolic subcutaneous nodule anterior to the left shoulder musculature concerning for cutaneous metastasis in addition to the enlarging nodule in the retroperitoneum anterior to the left iliacus muscle that also hypermetabolic and concerning for metastatic deposit with enlarging hypermetabolic nodule and consolidation peripherally in the left upper lobe concerning for lung cancer recurrence. She had surgical resection of the soft tissue lesion in the retroperitoneum  anterior to the left iliacus muscle and this was consistent with follicular lymphoma.   Her last CT scan showed Her scan showed stable disease except for a slightly enlarging right lower lobe pulmonary nodule as well as soft tissue nodule in the posterior pararenal space.  If further disease progression, Dr. Arbutus Ped would recommend repeat PET scan and probably rebiopsy to rule out any resistant mutation.   She recently had a restaging CT scan. The patient was seen with Dr. Arbutus Ped. Dr. Arbutus Ped personally and independently reviewed the scan and discussed the results with the patient. The scan showed  minimal/mild increase in some of the nodules.  The right retroperitoneal nodule is stable.  Dr. Arbutus Ped recommends continuing to monitor for now.   We will see her back for follow-up visit in 2 months for evaluation repeat blood work, then we will arrange for another follow up 2 months after that with a repeat CT scan of the chest, abdomen, and pelvis.  The patient was advised to call immediately if she has any concerning symptoms in the interval. The patient voices understanding of current disease status and treatment options and is in agreement with the current care plan. All questions were answered. The patient knows to call the clinic with any problems, questions or concerns. We can certainly see the patient much sooner if necessary     Orders Placed This Encounter  Procedures   CBC with Differential (Cancer Center Only)    Standing Status:   Future    Standing Expiration Date:   03/04/2024   CMP (Cancer Center only)    Standing Status:   Future    Standing Expiration Date:   03/04/2024      Johnette Abraham Rajanae Mantia, PA-C 03/05/23  ADDENDUM:: Hematology/Oncology Attending: I had a face-to-face encounter with the patient today.  I reviewed her records, lab, scan and recommended her care plan.  This is a very pleasant 77 years old white female with a stage IV non-small cell lung cancer,  adenocarcinoma with positive EGFR mutation with deletion in exon 19 diagnosed in June 2016.  The patient is currently on treatment with GILOTRIF 30 mg p.o. daily status post almost 85 months.  She has been tolerating this treatment well except for occasional skin rash and scaly scalp as well as few episodes of diarrhea manageable with Imodium. She had repeat CT scan of the chest, abdomen and pelvis performed recently.  I personally and independently reviewed the scan images and discussed results with the patient today. Her scan showed very mild increase in some of the pulmonary nodules. I recommended for the patient to continue her current treatment with Giotrif with the same dose for now. We will see her back for follow-up visit in 2 months for evaluation and repeat blood work. Will consider repeating her imaging studies again in 4 months for close monitoring of the pulmonary nodules. The patient was advised to call immediately if she has any other concerning symptoms in the interval. The total time spent in the appointment was 30 minutes. Disclaimer: This note was dictated with voice recognition software. Similar sounding words can inadvertently be transcribed and may be missed upon review. Lajuana Matte, MD 03/05/23

## 2023-02-28 ENCOUNTER — Other Ambulatory Visit (HOSPITAL_COMMUNITY): Payer: Self-pay

## 2023-02-28 ENCOUNTER — Other Ambulatory Visit: Payer: Self-pay

## 2023-02-28 DIAGNOSIS — C3490 Malignant neoplasm of unspecified part of unspecified bronchus or lung: Secondary | ICD-10-CM

## 2023-03-01 ENCOUNTER — Other Ambulatory Visit (HOSPITAL_COMMUNITY): Payer: Self-pay

## 2023-03-01 ENCOUNTER — Other Ambulatory Visit: Payer: Self-pay

## 2023-03-01 ENCOUNTER — Ambulatory Visit (HOSPITAL_COMMUNITY)
Admission: RE | Admit: 2023-03-01 | Discharge: 2023-03-01 | Disposition: A | Discharge status: Home / Self Care | Payer: Medicare Other | Source: Ambulatory Visit | Attending: Internal Medicine | Admitting: Internal Medicine

## 2023-03-01 ENCOUNTER — Inpatient Hospital Stay: Payer: Medicare Other | Attending: Internal Medicine

## 2023-03-01 DIAGNOSIS — I10 Essential (primary) hypertension: Secondary | ICD-10-CM | POA: Insufficient documentation

## 2023-03-01 DIAGNOSIS — R509 Fever, unspecified: Secondary | ICD-10-CM | POA: Insufficient documentation

## 2023-03-01 DIAGNOSIS — Z90722 Acquired absence of ovaries, bilateral: Secondary | ICD-10-CM | POA: Insufficient documentation

## 2023-03-01 DIAGNOSIS — E039 Hypothyroidism, unspecified: Secondary | ICD-10-CM | POA: Insufficient documentation

## 2023-03-01 DIAGNOSIS — R059 Cough, unspecified: Secondary | ICD-10-CM | POA: Insufficient documentation

## 2023-03-01 DIAGNOSIS — M4316 Spondylolisthesis, lumbar region: Secondary | ICD-10-CM | POA: Insufficient documentation

## 2023-03-01 DIAGNOSIS — Z90710 Acquired absence of both cervix and uterus: Secondary | ICD-10-CM | POA: Insufficient documentation

## 2023-03-01 DIAGNOSIS — D1803 Hemangioma of intra-abdominal structures: Secondary | ICD-10-CM | POA: Insufficient documentation

## 2023-03-01 DIAGNOSIS — C3431 Malignant neoplasm of lower lobe, right bronchus or lung: Secondary | ICD-10-CM | POA: Insufficient documentation

## 2023-03-01 DIAGNOSIS — E785 Hyperlipidemia, unspecified: Secondary | ICD-10-CM | POA: Insufficient documentation

## 2023-03-01 DIAGNOSIS — C3490 Malignant neoplasm of unspecified part of unspecified bronchus or lung: Secondary | ICD-10-CM | POA: Insufficient documentation

## 2023-03-01 DIAGNOSIS — R197 Diarrhea, unspecified: Secondary | ICD-10-CM | POA: Insufficient documentation

## 2023-03-01 DIAGNOSIS — Z79899 Other long term (current) drug therapy: Secondary | ICD-10-CM | POA: Insufficient documentation

## 2023-03-01 DIAGNOSIS — I700 Atherosclerosis of aorta: Secondary | ICD-10-CM | POA: Insufficient documentation

## 2023-03-01 DIAGNOSIS — Z880 Allergy status to penicillin: Secondary | ICD-10-CM | POA: Insufficient documentation

## 2023-03-01 DIAGNOSIS — Z923 Personal history of irradiation: Secondary | ICD-10-CM | POA: Insufficient documentation

## 2023-03-01 DIAGNOSIS — M954 Acquired deformity of chest and rib: Secondary | ICD-10-CM | POA: Insufficient documentation

## 2023-03-01 DIAGNOSIS — F419 Anxiety disorder, unspecified: Secondary | ICD-10-CM | POA: Insufficient documentation

## 2023-03-01 DIAGNOSIS — M5137 Other intervertebral disc degeneration, lumbosacral region: Secondary | ICD-10-CM | POA: Insufficient documentation

## 2023-03-01 LAB — CBC WITH DIFFERENTIAL (CANCER CENTER ONLY)
Abs Immature Granulocytes: 0.02 10*3/uL (ref 0.00–0.07)
Basophils Absolute: 0.1 10*3/uL (ref 0.0–0.1)
Basophils Relative: 1 %
Eosinophils Absolute: 0.3 10*3/uL (ref 0.0–0.5)
Eosinophils Relative: 5 %
HCT: 37 % (ref 36.0–46.0)
Hemoglobin: 11.9 g/dL — ABNORMAL LOW (ref 12.0–15.0)
Immature Granulocytes: 0 %
Lymphocytes Relative: 21 %
Lymphs Abs: 1.2 10*3/uL (ref 0.7–4.0)
MCH: 27.7 pg (ref 26.0–34.0)
MCHC: 32.2 g/dL (ref 30.0–36.0)
MCV: 86 fL (ref 80.0–100.0)
Monocytes Absolute: 0.5 10*3/uL (ref 0.1–1.0)
Monocytes Relative: 8 %
Neutro Abs: 3.8 10*3/uL (ref 1.7–7.7)
Neutrophils Relative %: 65 %
Platelet Count: 227 10*3/uL (ref 150–400)
RBC: 4.3 MIL/uL (ref 3.87–5.11)
RDW: 13.7 % (ref 11.5–15.5)
WBC Count: 5.7 10*3/uL (ref 4.0–10.5)
nRBC: 0 % (ref 0.0–0.2)

## 2023-03-01 LAB — CMP (CANCER CENTER ONLY)
ALT: 13 U/L (ref 0–44)
AST: 16 U/L (ref 15–41)
Albumin: 4 g/dL (ref 3.5–5.0)
Alkaline Phosphatase: 72 U/L (ref 38–126)
Anion gap: 6 (ref 5–15)
BUN: 16 mg/dL (ref 8–23)
CO2: 28 mmol/L (ref 22–32)
Calcium: 8.9 mg/dL (ref 8.9–10.3)
Chloride: 108 mmol/L (ref 98–111)
Creatinine: 0.9 mg/dL (ref 0.44–1.00)
GFR, Estimated: >60 mL/min (ref >60)
Glucose, Bld: 96 mg/dL (ref 70–99)
Potassium: 4.2 mmol/L (ref 3.5–5.1)
Sodium: 142 mmol/L (ref 135–145)
Total Bilirubin: 0.9 mg/dL (ref 0.3–1.2)
Total Protein: 7.1 g/dL (ref 6.5–8.1)

## 2023-03-01 MED ORDER — SODIUM CHLORIDE (PF) 0.9 % IJ SOLN
INTRAMUSCULAR | Status: AC
  Filled 2023-03-01: qty 50

## 2023-03-01 MED ORDER — IOHEXOL 300 MG/ML  SOLN
100.0000 mL | Freq: Once | INTRAMUSCULAR | Status: AC | PRN
  Administered 2023-03-01: 100 mL via INTRAVENOUS

## 2023-03-05 ENCOUNTER — Inpatient Hospital Stay (HOSPITAL_BASED_OUTPATIENT_CLINIC_OR_DEPARTMENT_OTHER): Payer: Medicare Other | Admitting: Physician Assistant

## 2023-03-05 VITALS — BP 137/82 | HR 88 | Temp 97.7°F | Resp 17 | Wt 170.0 lb

## 2023-03-05 DIAGNOSIS — E785 Hyperlipidemia, unspecified: Secondary | ICD-10-CM | POA: Diagnosis not present

## 2023-03-05 DIAGNOSIS — M5137 Other intervertebral disc degeneration, lumbosacral region: Secondary | ICD-10-CM | POA: Diagnosis not present

## 2023-03-05 DIAGNOSIS — D1803 Hemangioma of intra-abdominal structures: Secondary | ICD-10-CM | POA: Diagnosis not present

## 2023-03-05 DIAGNOSIS — R059 Cough, unspecified: Secondary | ICD-10-CM | POA: Diagnosis not present

## 2023-03-05 DIAGNOSIS — Z90722 Acquired absence of ovaries, bilateral: Secondary | ICD-10-CM | POA: Diagnosis not present

## 2023-03-05 DIAGNOSIS — I10 Essential (primary) hypertension: Secondary | ICD-10-CM | POA: Diagnosis not present

## 2023-03-05 DIAGNOSIS — I700 Atherosclerosis of aorta: Secondary | ICD-10-CM | POA: Diagnosis not present

## 2023-03-05 DIAGNOSIS — C3431 Malignant neoplasm of lower lobe, right bronchus or lung: Secondary | ICD-10-CM | POA: Diagnosis present

## 2023-03-05 DIAGNOSIS — M4316 Spondylolisthesis, lumbar region: Secondary | ICD-10-CM | POA: Diagnosis not present

## 2023-03-05 DIAGNOSIS — Z880 Allergy status to penicillin: Secondary | ICD-10-CM | POA: Diagnosis not present

## 2023-03-05 DIAGNOSIS — C3492 Malignant neoplasm of unspecified part of left bronchus or lung: Secondary | ICD-10-CM | POA: Diagnosis not present

## 2023-03-05 DIAGNOSIS — Z90710 Acquired absence of both cervix and uterus: Secondary | ICD-10-CM | POA: Diagnosis not present

## 2023-03-05 DIAGNOSIS — R197 Diarrhea, unspecified: Secondary | ICD-10-CM | POA: Diagnosis not present

## 2023-03-05 DIAGNOSIS — Z923 Personal history of irradiation: Secondary | ICD-10-CM | POA: Diagnosis not present

## 2023-03-05 DIAGNOSIS — R509 Fever, unspecified: Secondary | ICD-10-CM | POA: Diagnosis not present

## 2023-03-05 DIAGNOSIS — Z79899 Other long term (current) drug therapy: Secondary | ICD-10-CM | POA: Diagnosis not present

## 2023-03-05 DIAGNOSIS — M954 Acquired deformity of chest and rib: Secondary | ICD-10-CM | POA: Diagnosis not present

## 2023-03-05 DIAGNOSIS — F419 Anxiety disorder, unspecified: Secondary | ICD-10-CM | POA: Diagnosis not present

## 2023-03-05 DIAGNOSIS — E039 Hypothyroidism, unspecified: Secondary | ICD-10-CM | POA: Diagnosis not present

## 2023-03-09 ENCOUNTER — Other Ambulatory Visit: Payer: Self-pay

## 2023-03-24 ENCOUNTER — Other Ambulatory Visit (HOSPITAL_COMMUNITY): Payer: Self-pay

## 2023-04-06 ENCOUNTER — Other Ambulatory Visit: Payer: Self-pay | Admitting: Internal Medicine

## 2023-04-06 ENCOUNTER — Other Ambulatory Visit (HOSPITAL_COMMUNITY): Payer: Self-pay

## 2023-04-06 ENCOUNTER — Other Ambulatory Visit: Payer: Self-pay

## 2023-04-06 MED ORDER — AFATINIB DIMALEATE 30 MG PO TABS
ORAL_TABLET | ORAL | 2 refills | Status: DC
  Filled 2023-04-06: qty 30, 30d supply, fill #0
  Filled 2023-05-09 (×2): qty 30, 30d supply, fill #1
  Filled 2023-06-05: qty 30, 30d supply, fill #2

## 2023-04-11 ENCOUNTER — Other Ambulatory Visit (HOSPITAL_COMMUNITY): Payer: Self-pay

## 2023-04-12 ENCOUNTER — Ambulatory Visit (HOSPITAL_BASED_OUTPATIENT_CLINIC_OR_DEPARTMENT_OTHER): Payer: Medicare Other | Attending: Orthopaedic Surgery | Admitting: Physical Therapy

## 2023-04-12 ENCOUNTER — Other Ambulatory Visit: Payer: Self-pay

## 2023-04-12 ENCOUNTER — Encounter (HOSPITAL_BASED_OUTPATIENT_CLINIC_OR_DEPARTMENT_OTHER): Payer: Self-pay | Admitting: Physical Therapy

## 2023-04-12 DIAGNOSIS — G8929 Other chronic pain: Secondary | ICD-10-CM | POA: Diagnosis present

## 2023-04-12 DIAGNOSIS — M6281 Muscle weakness (generalized): Secondary | ICD-10-CM | POA: Insufficient documentation

## 2023-04-12 DIAGNOSIS — M25561 Pain in right knee: Secondary | ICD-10-CM | POA: Diagnosis present

## 2023-04-12 DIAGNOSIS — R262 Difficulty in walking, not elsewhere classified: Secondary | ICD-10-CM | POA: Insufficient documentation

## 2023-04-12 DIAGNOSIS — M25562 Pain in left knee: Secondary | ICD-10-CM | POA: Diagnosis present

## 2023-04-12 NOTE — Therapy (Signed)
OUTPATIENT PHYSICAL THERAPY LOWER EXTREMITY EVALUATION   Patient Name: Elizabeth [name] MRN: [id number] DOB:03/02/1946, 77 y.o., female Today's Date: 04/12/2023  END OF SESSION:  PT End of Session - 04/12/23 1419     Visit Number 1    Number of Visits 13    Date for PT Re-Evaluation 05/24/23    Authorization Type [employer] MCR    Authorization Time Period 04/12/23 to 06/07/23    Progress Note Due on Visit 10    PT Start Time 1346    PT Stop Time 1426    PT Time Calculation (min) 40 min    Activity Tolerance Patient tolerated treatment well    Behavior During Therapy [facility] for tasks assessed/performed             Past Medical History:  Diagnosis Date   Adenocarcinoma of left lung, stage 4 (HCC) 04/05/2015   Biopsy confirmed CT SCAN: There are innumerable bilateral pulmonary nodules. These range in size from about 5 mm to 2 cm. They demonstrate irregular indistinct borders, the larger ones demonstrating spiculation. PET SCAN: Diffuse pulmonary metastatic disease with a 4 cm dominant left lower low lung lesion. No enlarged or hypermetabolic mediastinal or hilar adenopathy.   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   Cancer (HCC)    Change of skin related to chemotherapy 09/25/2017   Drug-induced skin rash 01/17/2016   History of radiation therapy 12/18/2019   SBRT left lung  12/11/2019-12/18/2019   Dr Antony Blackbird   History of radiation therapy 06/21/2021   left lung 06/14/2021-06/21/2021 Dr Antony Blackbird   Hyperlipidemia    Hypertension    Hypothyroidism    Insomnia    Prediabetes    Thyroid disease    Past Surgical History:  Procedure Laterality Date   ABDOMINAL HYSTERECTOMY  10/23/1993   w BSO   CYST REMOVAL HAND Right    EXCISION MASS ABDOMINAL N/A 08/11/2021   Procedure: EXCISIONAL BIOPSY OF LEFT RETROPERITONEAL MASS, POSSIBLE INTRAOPERATIVE LAPAROSCOPIC ULTRASOUND;  Surgeon: Karie Soda, MD;  Location: [facility];  Service: General;  Laterality:  N/A;   LAPAROSCOPY N/A 08/11/2021   Procedure: LAPAROSCOPIC EXPLORATION;  Surgeon: Karie Soda, MD;  Location: [facility];  Service: General;  Laterality: N/A;   TONSILLECTOMY AND ADENOIDECTOMY     Patient Active Problem List   Diagnosis Date Noted   Multiple closed fractures of ribs of left side 10/05/2022   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   Metastatic lung cancer (metastasis from lung to other site) (HCC) 08/11/2021   Chest pain 07/05/2021   Osteopenia 05/18/2021   Obesity (BMI 30.0-34.9) 05/16/2021   Aortic atherosclerosis (HCC) by Abd CT scan on 01/26/2-021 05/29/2016   Encounter for antineoplastic chemotherapy 10/19/2015   Adenocarcinoma of left lung, stage 4 (HCC) 04/05/2015   Medication management 11/03/2014   Vitamin D deficiency 10/21/2013   Hypothyroidism 10/21/2013   Essential hypertension 10/21/2013   Hyperlipidemia, mixed 10/21/2013   Abnormal glucose    Anxiety    Insomnia     PCP: Lucky Cowboy MD   REFERRING PROVIDER: Marcene Corning, MD  REFERRING DIAG: Bilateral knee DJD  THERAPY DIAG:  Chronic pain of left knee  Chronic pain of right knee  Muscle weakness (generalized)  Difficulty in walking, not elsewhere classified  Rationale for Evaluation and Treatment: Rehabilitation  ONSET DATE: 3 years ago   SUBJECTIVE:   SUBJECTIVE STATEMENT: My left knee has been bone on bone for about 3 years now, had  the gel shots but need them again. Right knee started hurting in November, the ortho diagnosed it as a Baker's Cyst and gave me a shot. Later got told by the ortho that there was OA, MRI also showed a small tear (she wasn't sure what/where and images not available), ortho ultimately ended up giving gel shots in the R knee which didn't help. Started Meloxicam, its been helping me sleep. I feel like I'm losing strength in my thighs, have a hx of lung cancer so its hard for me to breathe during exercise to help build strength back up.   PERTINENT  HISTORY: Stage 4 adenocarcinoma of L lung, anxiety, B-cell non-Hodgkin lymphoma, hx radiation therapy, HLD, HTN, hypothyroidism, hx laparoscopy  PAIN:  Are you having pain? 0/10 at rest, at worst 5/10 in the mornings   PRECAUTIONS: None  WEIGHT BEARING RESTRICTIONS: No  FALLS:  Has patient fallen in last 6 months? No  LIVING ENVIRONMENT: Lives with: lives with their spouse Lives in: House/apartment Stairs: No Has following equipment at home: None  OCCUPATION: retired   PLOF: Independent, Independent with basic ADLs, Independent with gait, and Independent with transfers  PATIENT GOALS: be pain free in knees, be able to walk without limping   NEXT MD VISIT: Dr. Jerl Santos tomorrow   OBJECTIVE:   DIAGNOSTIC FINDINGS: not available in EPIC   PATIENT SURVEYS:  FOTO 37  COGNITION: Overall cognitive status: Within functional limits for tasks assessed      MUSCLE LENGTH:  HS severe limitation B Piriformis mild limitation B Hip flexors moderate limitation per observation    LOWER EXTREMITY ROM:  Active ROM Right eval Left eval  Hip flexion    Hip extension    Hip abduction    Hip adduction    Hip internal rotation    Hip external rotation    Knee flexion 5* 128*  Knee extension 4* 132*  Ankle dorsiflexion    Ankle plantarflexion    Ankle inversion    Ankle eversion     (Blank rows = not tested)  LOWER EXTREMITY MMT:  MMT Right eval Left eval  Hip flexion 4+ 4+  Hip extension    Hip abduction 3+ 3+  Hip adduction    Hip internal rotation    Hip external rotation    Knee flexion 5 5  Knee extension 5 5  Ankle dorsiflexion 5 5  Ankle plantarflexion    Ankle inversion    Ankle eversion     (Blank rows = not tested)    GAIT: Distance walked: in clinic distances  Assistive device utilized: None Level of assistance: Complete Independence Comments: + trendelenburg, B pronation, narrow BOS    TODAY'S TREATMENT:                                                                                                                               DATE:   Eval  Objective measures, appropriate education, care planning  TherEx  Nustep L2 x6 minutes BLEs only  HS stretches 3x30 seconds B Bridges x5  Supine clams red TB x5       PATIENT EDUCATION:  Education details: exam findings, HEP, POC, benefits of water PT  Person educated: Patient Education method: Programmer, multimedia, Demonstration, and Handouts Education comprehension: verbalized understanding, returned demonstration, and needs further education  HOME EXERCISE PROGRAM: Access Code: [email or web address] URL: [email or web address] Date: 04/12/2023 Prepared by: Nedra Hai  Exercises - Supine Hamstring Stretch  - 1 x daily - 7 x weekly - 1 sets - 3 reps - 30 hold - Supine Bridge  - 1 x daily - 7 x weekly - 2 sets - 5 reps - 1 hold - Hooklying Clamshell with Resistance  - 1 x daily - 7 x weekly - 2 sets - 5 reps - 1 hold  ASSESSMENT:  CLINICAL IMPRESSION: Patient is a 77 y.o. F who was seen today for physical therapy evaluation and treatment for B knee DJD. Exam is typical and as expected, with some functional muscle weakness and flexibility impairments, also mild ROM deficits especially in extension. May really benefit from water PT given extent of reported OA. Will trial PT and make every attempt to address pain and functional concerns.    OBJECTIVE IMPAIRMENTS: Abnormal gait, difficulty walking, decreased ROM, decreased strength, impaired flexibility, obesity, and pain.   ACTIVITY LIMITATIONS: standing, sleeping, stairs, and locomotion level  PARTICIPATION LIMITATIONS: shopping, community activity, yard work, and church  PERSONAL FACTORS: Age, Behavior pattern, Fitness, Past/current experiences, Social background, and Time since onset of injury/illness/exacerbation are also affecting patient's functional outcome.   REHAB POTENTIAL: Fair chronicity of condition, hx of  radiation tx for CA, sedentary life style   CLINICAL DECISION MAKING: Stable/uncomplicated  EVALUATION COMPLEXITY: Low   GOALS: Goals reviewed with patient? Yes  SHORT TERM GOALS: Target date: 05/03/2023   Will be compliant with appropriate progressive HEP  Baseline: Goal status: INITIAL  2.  Flexibility impairments to have improved by 50% in all affected groups  Baseline:  Goal status: INITIAL  3.  Pain to be no more than 3/10 at worst  Baseline:  Goal status: INITIAL   LONG TERM GOALS: Target date: 05/24/2023    MMT to be 5/5 globally  Baseline:  Goal status: INITIAL  2.  Will be able to stand at least 45 minutes without leaning against something or sitting down with no increase from resting pain levels  Baseline:  Goal status: INITIAL  3.  Will be able to walk 1/2 mile on even surfaces without any rest breaks with no increase from resting pain levels  Baseline:  Goal status: INITIAL  4.  FOTO score to be within 5 points of predicted value to show significant subjective improvement  Baseline:  Goal status: INITIAL     PLAN:  PT FREQUENCY: 2x/week  PT DURATION: 6 weeks  PLANNED INTERVENTIONS: Therapeutic exercises, Therapeutic activity, Patient/Family education, Self Care, Joint mobilization, Stair training, Orthotic/Fit training, DME instructions, Aquatic Therapy, Dry Needling, Electrical stimulation, Cryotherapy, Moist heat, Taping, Ultrasound, Ionotophoresis 4mg /ml Dexamethasone, Manual therapy, and Re-evaluation  PLAN FOR NEXT SESSION: water therapy with progression to land as tolerated- strength, flexibility, extension ROM, functional activity tolerance   Nedra Hai, PT, DPT 04/12/23 2:27 PM

## 2023-04-19 NOTE — Progress Notes (Addendum)
MEDICARE ANNUAL WELLNESS AND FOLLOW UP  Assessment and Plan:   Annual Medicare Wellness Visit Due annually  Health maintenance reviewed  Atherosclerosis of aorta (HCC) 10/2019 Control blood pressure, cholesterol, glucose, increase exercise.  Discussed LDL goal <100  Essential hypertension Continue Losartan 50 mg daily and add hydrochlorothiazide 25 mg every day  Monitor blood pressure at home; call if consistently over 130/80 Continue DASH diet.   Reminder to go to the ER if any CP, SOB, nausea, dizziness, severe HA, changes vision/speech, left arm numbness and tingling and jaw pain. -     CBC with Differential/Platelet -     COMPLETE METABOLIC PANEL WITH GFR  Hyperlipidemia, mixed Has been off of statin secondary to cancer/chemo Discussed and she would prefer to start a gentle agent - zetia sent in with information. If no aggressive chemo with new lesions could transition to statin if needed. Follow up 3 months. Discussed dietary and exercise modifications Low fat diet -     Lipid panel  Acquired hypothyroidism Taking levothyroxine  Reminder to take on an empty stomach 30-55mins before first meal of the day. No antacid medications for 4 hours. 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 Shirline Frees  Vitamin D deficiency Continue supplementation to maintain goal of 60-100 Taking Vitamin D 10,000 IU three times a week.  Abnormal glucose Discussed dietary and exercise modifications  Anxiety Well managed by current regimen; continue medications Stress management techniques discussed, increase water, good sleep hygiene discussed, increase exercise, and increase veggies.   Insomnia, unspecified type Discussed good sleep hygiene, decrease stimulation prior to sleep Increase day time activity Avoid caffeine in evenings Uses alprazolam PRN after failing trazodone,  gabapentin; understands risks associated with this med and wishes to continue Getting through Dr. Marsh Dolly -   Obesity (BMI 30-34.9) Discussed dietary and exercise modifications Weight loss encouraged  Medication management Continued  Osteopenia of left femur Neck Continue weight bearing exercises DEXA ordered     Over of face to face interview, exam, chart review, counseling preformed this office visit with moderate decision making.  Discussed med's effects and SE's. Screening labs and tests as requested with regular follow-up as recommended. Future Appointments  Date Time Provider Department Center  05/02/2023  3:00 PM Jeanmarie Hubert, PT [facility]  05/04/2023  3:15 PM Carlson-Long, Victorino Dike L [facility]  05/07/2023  2:00 PM [facility] None  05/07/2023  2:30 PM Heilingoetter, Cassandra L, PA-C [facility] None  05/09/2023  3:00 PM Jeanmarie Hubert, PT [facility]  05/11/2023  3:15 PM Hodor, Donnel Saxon, PTA [facility]  05/16/2023  3:30 PM Susy Manor, PT [facility]  05/18/2023 11:00 AM Susy Manor, PT [facility]  05/23/2023  3:30 PM Susy Manor, PT [facility]  05/25/2023  1:45 PM Hodor, Donnel Saxon, PTA [facility]  07/23/2023 11:30 AM Lucky Cowboy, MD [facility] None  10/22/2023 11:00 AM Raynelle Dick, NP [facility] None  01/21/2024 11:00 AM Lucky Cowboy, MD [facility] None  04/24/2024 11:00 AM Raynelle Dick, NP [facility] None     Plan:   During the course of the visit the patient was educated and counseled about appropriate screening and preventive services including:   Pneumococcal vaccine  Prevnar 13 Influenza vaccine Td vaccine Screening electrocardiogram Bone densitometry screening Colorectal cancer screening Diabetes screening Glaucoma screening Nutrition counseling  Advanced directives:  requested   HPI  77 y.o. female  presents for annual medicare wellness and 6 month follow up. She has Abnormal glucose;  Anxiety; Insomnia; Vitamin D deficiency; Hypothyroidism; Essential hypertension; Hyperlipidemia, mixed; Medication management; Adenocarcinoma of left lung, stage 4 (HCC); Encounter for antineoplastic chemotherapy; Aortic atherosclerosis (HCC) by Abd CT scan on 01/26/2-021; Obesity (BMI 30.0-34.9); Osteopenia; Chest pain; Metastatic lung cancer (metastasis from lung to other site) [facility]); Multiple closed fractures of ribs of left side; and B-cell non-Hodgkin lymphoma (HCC) on their problem list.  She has history of stage 4 lung cancer, on Gilotrif maintenance therapy and following with Dr. Arbutus Ped. Recent PET scan showed recurrent tumor with concern for mets. She had biopsy of nodule and referred to Dr. Roselind Messier for consideration of SBRT to these 3 suspicious nodules and she will continue her current treatment with Gilotrif. 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 Shirline Frees, has follow up 05/07/23- every three months  She has anxiety/insomnia, currently prescribed xanax 1 mg by another office, tried gabapentin, trazodone without success. Now getting via Dr. Candie Chroman at another office.   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 She is doing gel injections in left knee(doesn't work on right knee) and is on Meloxicam. She is starting PT in July.   BMI is Body mass index is 33.32 kg/m., she has not been working on diet and exercise, going to chiropractor to help with back, She is walking her dogs, one is 5 and new dog is much more active. She is doing exercises daily and starting PT on July. Wt Readings from Last 3 Encounters:  04/20/23 170 lb 9.6 oz (77.4 kg)  03/05/23 170 lb (77.1 kg)  02/19/23 170 lb (77.1 kg)   Her blood pressure has been controlled at home, today their BP is BP: (!) 142/86.   BP Readings from Last 3 Encounters:  04/20/23 (!) 142/86  03/05/23 137/82  02/19/23 130/74  She does not workout. She denies chest  pain, dizziness. Denies wheezing/coughing.   She has aortic atherosclerosis per CT 10/2019.   She is on Zetia (stopped statin several years back with cancer diagnosis) and denies myalgias. Her cholesterol is not at goal. The cholesterol last visit was:  Lab Results  Component Value Date   CHOL 168 12/21/2022   HDL 60 12/21/2022   LDLCALC 87 12/21/2022   TRIG 118 12/21/2022   CHOLHDL 2.8 12/21/2022    Last A1C in the office was:  Lab Results  Component Value Date   HGBA1C 6.0 (H) 12/21/2022   Patient is on Vitamin D supplement.   Lab Results  Component Value Date   VD25OH 63 12/21/2022     She is on thyroid medication. Her medication was not changed last visit.She is not on biotin. Takes 50 mcg daily.  Lab Results  Component Value Date   TSH 5.75 (H) 12/21/2022  .   Current Medications:  Current Outpatient Medications on File Prior to Visit  Medication Sig Dispense Refill   afatinib dimaleate (GILOTRIF) 30 MG tablet TAKE 1 TABLET (30MG ) BY MOUTH ONCE DAILY. TAKE ON AN EMPTY STOMACH 1 HOUR BEFORE OR 2 HOURS AFTER A MEAL 30 tablet 2   ALPRAZolam (XANAX) 1 MG tablet Take 1 mg by mouth at bedtime.     aspirin 81 MG tablet Take 81 mg by mouth at bedtime.      ezetimibe (ZETIA) 10 MG tablet Take 1 tablet (10 mg total) by  mouth daily. 90 tablet 3   ibuprofen (ADVIL,MOTRIN) 200 MG tablet Take 400 mg by mouth every 6 (six) hours as needed for headache or moderate pain.     levothyroxine (SYNTHROID) 50 MCG tablet Take  1 tablet  Daily  on an empty stomach with only water for 30 minutes & no Antacid meds, Calcium or Magnesium for 4 hours & avoid Biotin                                             /                       TAKE                         BY            MOUTH 90 tablet 3   losartan (COZAAR) 50 MG tablet Take 1 tablet (50 mg total) by mouth daily. 30 tablet 11   meloxicam (MOBIC) 15 MG tablet Take 15 mg by mouth daily.     montelukast (SINGULAIR) 10 MG tablet Take one tablet for  allergies 90 tablet 3   gabapentin (NEURONTIN) 600 MG tablet Take 1/2 to 1 tablet 2 to 3 x / Daily as needed for Pain (Patient not taking: Reported on 04/20/2023) 270 tablet 1   No current facility-administered medications on file prior to visit.    Health Maintenance:   Immunization History  Administered Date(s) Administered   Influenza Split 07/16/2018   Influenza, High Dose Seasonal PF 07/31/2014, 07/31/2014, 08/06/2017, 08/06/2017, 07/09/2019, 07/09/2019, 07/07/2020, 08/02/2022, 08/02/2022   Influenza-Unspecified 09/13/2015, 07/23/2016, 08/06/2017, 07/23/2018   Moderna Covid-19 Vaccine Bivalent Booster 79yrs & up 09/09/2021   Moderna Sars-Covid-2 Vaccination 12/05/2019, 01/03/2020, 07/26/2020, 05/16/2021   Pneumococcal Conjugate-13 12/24/2015   Pneumococcal Polysaccharide-23 07/22/2012   Pneumococcal-Unspecified 07/22/2012   Td 12/25/2005   Zoster, Live 01/09/2007   Health Maintenance  Topic Date Due   COVID-19 Vaccine (6 - 2023-24 season) 05/06/2023 (Originally 06/23/2022)   Zoster Vaccines- Shingrix (1 of 2) 07/21/2023 (Originally 01/07/1965)   INFLUENZA VACCINE  05/24/2023   Medicare Annual Wellness (AWV)  04/19/2024   DEXA SCAN  07/24/2024   Pneumonia Vaccine 62+ Years old  Completed   HPV VACCINES  Aged Out   DTaP/Tdap/Td  Discontinued   Colonoscopy  Discontinued   Hepatitis C Screening  Discontinued     Vision: Dr. Ernesto Rutherford, last 12/2022 Dental: Dr. Jacolyn Reedy, last 12/2022, q66m  Patient Care Team: Lucky Cowboy, MD as PCP - General (Internal Medicine) Estrella Deeds, OD as Physician Assistant (Optometry) Corky Crafts, MD as Consulting Physician (Interventional Cardiology) Dorena Cookey, MD (Inactive) as Consulting Physician (Gastroenterology) Cherlyn Roberts, MD as Consulting Physician (Dermatology) Ernesto Rutherford, MD as Consulting Physician (Ophthalmology) Harriette Bouillon, MD as Consulting Physician (General Surgery) Marcene Corning, MD as Consulting  Physician (Orthopedic Surgery) Si Gaul, MD as Consulting Physician (Oncology)  Medical History:  Past Medical History:  Diagnosis Date   Adenocarcinoma of left lung, stage 4 (HCC) 04/05/2015   Biopsy confirmed CT SCAN: There are innumerable bilateral pulmonary nodules. These range in size from about 5 mm to 2 cm. They demonstrate irregular indistinct borders, the larger ones demonstrating spiculation. PET SCAN: Diffuse pulmonary metastatic disease with a 4 cm dominant left lower low lung lesion. No enlarged or hypermetabolic mediastinal  or hilar adenopathy.   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   Cancer (HCC)    Change of skin related to chemotherapy 09/25/2017   Drug-induced skin rash 01/17/2016   History of radiation therapy 12/18/2019   SBRT left lung  12/11/2019-12/18/2019   Dr Antony Blackbird   History of radiation therapy 06/21/2021   left lung 06/14/2021-06/21/2021 Dr Antony Blackbird   Hyperlipidemia    Hypertension    Hypothyroidism    Insomnia    Prediabetes    Thyroid disease    Allergies Allergies  Allergen Reactions   Penicillins Swelling and Rash   SURGICAL HISTORY She  has a past surgical history that includes Tonsillectomy and adenoidectomy; Abdominal hysterectomy (10/23/1993); Cyst removal hand (Right); laparoscopy (N/A, 08/11/2021); and Excision mass abdominal (N/A, 08/11/2021). FAMILY HISTORY Her family history includes ALS in her father; Alcohol abuse in her mother and paternal grandfather; Bone cancer in her maternal uncle; Breast cancer in her cousin; Dementia (age of onset: 42) in her paternal aunt; Dementia (age of onset: 42) in her maternal grandmother; Heart disease in her paternal grandfather; Hypertension in her father; Liver disease in her mother; Lung cancer (age of onset: 84) in her paternal grandmother; Lung disease in her maternal grandfather; Melanoma in her paternal aunt. SOCIAL HISTORY She  reports that she quit  smoking about 48 years ago. Her smoking use included cigarettes. She has a 5.00 pack-year smoking history. She has never used smokeless tobacco. She reports that she does not drink alcohol and does not use drugs.   Immunization History  Administered Date(s) Administered   Influenza Split 07/16/2018   Influenza, High Dose Seasonal PF 07/31/2014, 07/31/2014, 08/06/2017, 08/06/2017, 07/09/2019, 07/09/2019, 07/07/2020, 08/02/2022, 08/02/2022   Influenza-Unspecified 09/13/2015, 07/23/2016, 08/06/2017, 07/23/2018   Moderna Covid-19 Vaccine Bivalent Booster 37yrs & up 09/09/2021   Moderna Sars-Covid-2 Vaccination 12/05/2019, 01/03/2020, 07/26/2020, 05/16/2021   Pneumococcal Conjugate-13 12/24/2015   Pneumococcal Polysaccharide-23 07/22/2012   Pneumococcal-Unspecified 07/22/2012   Td 12/25/2005   Zoster, Live 01/09/2007   Health Maintenance  Topic Date Due   COVID-19 Vaccine (6 - 2023-24 season) 05/06/2023 (Originally 06/23/2022)   Zoster Vaccines- Shingrix (1 of 2) 07/21/2023 (Originally 01/07/1965)   INFLUENZA VACCINE  05/24/2023   Medicare Annual Wellness (AWV)  04/19/2024   DEXA SCAN  07/24/2024   Pneumonia Vaccine 67+ Years old  Completed   HPV VACCINES  Aged Out   DTaP/Tdap/Td  Discontinued   Colonoscopy  Discontinued   Hepatitis C Screening  Discontinued  Mammogram 05/04/22- negative  MEDICARE WELLNESS OBJECTIVES: Physical activity:  starting PT next month Cardiac risk factors: Cardiac Risk Factors include: advanced age (>27men, >54 women);obesity (BMI >30kg/m2);hypertension;dyslipidemia Depression/mood screen:      04/20/2023   11:20 AM  Depression screen PHQ 2/9  Decreased Interest 0  Down, Depressed, Hopeless 0  PHQ - 2 Score 0    ADLs:     04/20/2023   11:15 AM 12/20/2022   11:53 PM  In your present state of health, do you have any difficulty performing the following activities:  Hearing? 0 0  Vision? 0 0  Difficulty concentrating or making decisions? 0 0  Walking or  climbing stairs? 1 0  Dressing or bathing? 0 0  Doing errands, shopping? 0 0     Cognitive Testing  Alert? Yes  Normal Appearance?Yes  Oriented to person? Yes  Place? Yes   Time? Yes  Recall of three objects?  Yes  Can perform simple calculations? Yes  Displays appropriate judgment?Yes  Can read the correct time from a watch face?Yes  EOL planning: Does Patient Have a Medical Advance Directive?: Yes Does patient want to make changes to medical advance directive?: No - Patient declined Copy of Healthcare Power of Attorney in Chart?: No - copy requested   Review of Systems: Review of Systems  Constitutional:  Negative for chills, fever and malaise/fatigue.  HENT:  Negative for congestion, ear pain and sore throat.   Eyes: Negative.   Respiratory:  Positive for shortness of breath (Chronic since lung cancer). Negative for cough and wheezing.   Cardiovascular:  Negative for chest pain, palpitations and leg swelling.  Gastrointestinal:  Negative for abdominal pain, blood in stool, constipation, diarrhea, heartburn and melena.  Genitourinary: Negative.   Skin:  Negative for itching and rash.  Neurological:  Negative for dizziness, sensory change, loss of consciousness and headaches.  Psychiatric/Behavioral:  Negative for depression, memory loss, substance abuse and suicidal ideas. The patient has insomnia (managing well with meds). The patient is not nervous/anxious.     Physical Exam: Estimated body mass index is 33.32 kg/m as calculated from the following:   Height as of this encounter: 5' (1.524 m).   Weight as of this encounter: 170 lb 9.6 oz (77.4 kg). BP (!) 142/86   Pulse 71   Temp (!) 97.3 F (36.3 C)   Ht 5' (1.524 m)   Wt 170 lb 9.6 oz (77.4 kg)   SpO2 96%   BMI 33.32 kg/m   General Appearance: Very pleasant, obese, well developed, in no apparent distress.  Eyes: PERRLA, EOMs, conjunctiva no swelling or erythema ENT/Mouth: Ear canals normal without obstruction,  swelling, erythema, or discharge.  TMs normal bilaterally with no erythema, bulging, retraction, or loss of landmark.  Oropharynx moist and clear with no exudate, erythema, or swelling.  Frontal and maxillary tenderness noted. Neck: Supple, thyroid normal. No bruits.  No cervical adenopathy Respiratory: Respiratory effort normal, Breath sounds without rales.  Bilateral wheezing noted. Cardio: RRR without murmurs, rubs or gallops. Brisk peripheral pulses without edema.  retractions.  Abdomen: Soft, nontender, no guarding, rebound, hernias, masses, or organomegaly.  Lymphatics: Non tender without lymphadenopathy.  Musculoskeletal: Full ROM all peripheral extremities,5/5 strength, and normal gait.  Skin:  Non-tender, warm and dry Neuro: Awake and oriented X 3, Cranial nerves intact, reflexes equal bilaterally. Normal muscle tone, no cerebellar symptoms. Sensation intact.  Psych:  normal affect, Insight and Judgment appropriate.    Medicare Attestation I have personally reviewed: The patient's medical and social history Their use of alcohol, tobacco or illicit drugs Their current medications and supplements The patient's functional ability including ADLs,fall risks, home safety risks, cognitive, and hearing and visual impairment Diet and physical activities Evidence for depression or mood disorders  The patient's weight, height, BMI, and visual acuity have been recorded in the chart.  I have made referrals, counseling, and provided education to the patient based on review of the above and I have provided the patient with a written personalized care plan for preventive services.    Raynelle Dick, NP 12:58 PM [facility]

## 2023-04-20 ENCOUNTER — Encounter: Payer: Self-pay | Admitting: Nurse Practitioner

## 2023-04-20 ENCOUNTER — Ambulatory Visit (INDEPENDENT_AMBULATORY_CARE_PROVIDER_SITE_OTHER): Payer: Medicare Other | Admitting: Nurse Practitioner

## 2023-04-20 VITALS — BP 142/86 | HR 71 | Temp 97.3°F | Ht 60.0 in | Wt 170.6 lb

## 2023-04-20 DIAGNOSIS — Z79899 Other long term (current) drug therapy: Secondary | ICD-10-CM

## 2023-04-20 DIAGNOSIS — M85852 Other specified disorders of bone density and structure, left thigh: Secondary | ICD-10-CM

## 2023-04-20 DIAGNOSIS — Z0001 Encounter for general adult medical examination with abnormal findings: Secondary | ICD-10-CM | POA: Diagnosis not present

## 2023-04-20 DIAGNOSIS — R6889 Other general symptoms and signs: Secondary | ICD-10-CM | POA: Diagnosis not present

## 2023-04-20 DIAGNOSIS — R7309 Other abnormal glucose: Secondary | ICD-10-CM

## 2023-04-20 DIAGNOSIS — E559 Vitamin D deficiency, unspecified: Secondary | ICD-10-CM | POA: Diagnosis not present

## 2023-04-20 DIAGNOSIS — C3492 Malignant neoplasm of unspecified part of left bronchus or lung: Secondary | ICD-10-CM | POA: Diagnosis not present

## 2023-04-20 DIAGNOSIS — I10 Essential (primary) hypertension: Secondary | ICD-10-CM

## 2023-04-20 DIAGNOSIS — E669 Obesity, unspecified: Secondary | ICD-10-CM

## 2023-04-20 DIAGNOSIS — E782 Mixed hyperlipidemia: Secondary | ICD-10-CM

## 2023-04-20 DIAGNOSIS — I700 Atherosclerosis of aorta: Secondary | ICD-10-CM

## 2023-04-20 DIAGNOSIS — E039 Hypothyroidism, unspecified: Secondary | ICD-10-CM

## 2023-04-20 DIAGNOSIS — Z0000 Encounter for general adult medical examination without abnormal findings: Secondary | ICD-10-CM

## 2023-04-20 DIAGNOSIS — G4700 Insomnia, unspecified: Secondary | ICD-10-CM

## 2023-04-20 DIAGNOSIS — F419 Anxiety disorder, unspecified: Secondary | ICD-10-CM

## 2023-04-20 MED ORDER — HYDROCHLOROTHIAZIDE 25 MG PO TABS
25.0000 mg | ORAL_TABLET | Freq: Every day | ORAL | 3 refills | Status: DC
Start: 2023-04-20 — End: 2023-09-03

## 2023-04-20 NOTE — Addendum Note (Signed)
Addended by: Anda Kraft E on: 04/20/2023 11:45 AM   Modules accepted: Orders

## 2023-04-20 NOTE — Patient Instructions (Signed)

## 2023-04-21 LAB — LIPID PANEL
Cholesterol: 170 mg/dL (ref <200)
HDL: 50 mg/dL (ref >=50)
LDL Cholesterol (Calc): 101 mg/dL (calc) — ABNORMAL HIGH
Non-HDL Cholesterol (Calc): 120 mg/dL (calc) (ref <130)
Total CHOL/HDL Ratio: 3.4 (calc) (ref <5.0)
Triglycerides: 95 mg/dL (ref <150)

## 2023-04-21 LAB — TSH: TSH: 3.13 mIU/L (ref 0.40–4.50)

## 2023-05-02 ENCOUNTER — Ambulatory Visit (HOSPITAL_BASED_OUTPATIENT_CLINIC_OR_DEPARTMENT_OTHER): Payer: Medicare Other | Admitting: Physical Therapy

## 2023-05-02 NOTE — Progress Notes (Signed)
[facility] OFFICE PROGRESS NOTE  Elizabeth Cowboy, MD [address] [place] [address]  DIAGNOSIS: Stage IV (T2a, N0, M1a) non-small cell lung cancer, adenocarcinoma with positive EGFR mutation with deletion in exon 19 diagnosed in June 2016 and presented with large mass in the left lower lobe in addition to multiple bilateral pulmonary nodules   PRIOR THERAPY: 1) Gilotrif 40 mg by mouth daily started 05/11/2015, status post 9 months of treatment discontinued on 02/17/2016 secondary to extensive skin rash. 2) SBRT to the enlarging left upper lobe nodule under the care of Dr. Mitzi Hansen.  CURRENT THERAPY: Gilotrif 30 mg by mouth daily started 02/21/2016 status post 86.5 months of treatment   INTERVAL HISTORY: Elizabeth [name] 77 y.o. female returns to the clinic today for a follow-up visit.  She was last seen by myself and Dr. Arbutus Ped on 03/05/23. 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 She does have a slowly enlarging pulmonary nodule that we are watching closely. If she has further disease progression, then Dr. Arbutus Ped will likely consider PET and probably rebiopsy to rule out resistant mutation.   Today, she returns to the clinic for a follow up visit. She is currently on targeted treatment with Gilotrif since 2017. She tolerates this well overall. Her creatinine is a little elevated today. She started hydrochlorothiazide on 04/20/23. She also states she does not drink that much water. Her husband currently is recovering from a cold. She mentions that she has a mild scratchy throat. They did not test for flu or covid. She denies fevers. She sometimes has night sweats due to a warm sleeping environment. She reports similar dyspnea on exertion but mentions it is worse with the heat/warm weather. She denies nausea or vomiting. She had some diarrhea which she attributed to her mobic. She stopped this and states her diarrhea is back to  baseline. She may occasional get diarrhea from her gilotrif. However, at the moment, she states she is leaning more toward some mild constipation. Denies any headache or visual changes.  Denies any rashes or skin changes. She is here for evaluation and repeat blood work.         MEDICAL HISTORY: Past Medical History:  Diagnosis Date   Adenocarcinoma of left lung, stage 4 (HCC) 04/05/2015   Biopsy confirmed CT SCAN: There are innumerable bilateral pulmonary nodules. These range in size from about 5 mm to 2 cm. They demonstrate irregular indistinct borders, the larger ones demonstrating spiculation. PET SCAN: Diffuse pulmonary metastatic disease with a 4 cm dominant left lower low lung lesion. No enlarged or hypermetabolic mediastinal or hilar adenopathy.   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  MEDICATIONS:  Current Outpatient Medications  Medication Sig Dispense Refill   afatinib dimaleate (GILOTRIF) 30 MG tablet TAKE 1 TABLET (30MG ) BY MOUTH ONCE DAILY. TAKE ON AN EMPTY STOMACH 1 HOUR BEFORE OR 2 HOURS AFTER A  MEAL 30 tablet 2   ALPRAZolam (XANAX) 1 MG tablet Take 1 mg by mouth at bedtime.     aspirin 81 MG tablet Take 81 mg by mouth at bedtime.      ezetimibe (ZETIA) 10 MG tablet Take 1 tablet (10 mg total) by mouth daily. 90 tablet 3   gabapentin (NEURONTIN) 600 MG tablet Take 1/2 to 1 tablet 2 to 3 x / Daily as needed for Pain (Patient not taking: Reported on 04/20/2023) 270 tablet 1   hydrochlorothiazide (HYDRODIURIL)  25 MG tablet Take 1 tablet (25 mg total) by mouth daily. 30 tablet 3   ibuprofen (ADVIL,MOTRIN) 200 MG tablet Take 400 mg by mouth every 6 (six) hours as needed for headache or moderate pain.     levothyroxine (SYNTHROID) 50 MCG tablet Take  1 tablet  Daily  on an empty stomach with only water for 30 minutes & no Antacid meds, Calcium or Magnesium for 4 hours & avoid Biotin                                             /                       TAKE                         BY            MOUTH 90 tablet 3   losartan (COZAAR) 50 MG tablet Take 1 tablet (50 mg total) by mouth daily. 30 tablet 11   meloxicam (MOBIC) 15 MG tablet Take 15 mg by mouth daily.     montelukast (SINGULAIR) 10 MG tablet Take one tablet for allergies 90 tablet 3   No current facility-administered medications for this visit.    SURGICAL HISTORY:  Past Surgical History:  Procedure Laterality Date   ABDOMINAL HYSTERECTOMY  10/23/1993   w BSO   CYST REMOVAL HAND Right    EXCISION MASS ABDOMINAL N/A 08/11/2021   Procedure: EXCISIONAL BIOPSY OF LEFT RETROPERITONEAL MASS, POSSIBLE INTRAOPERATIVE LAPAROSCOPIC ULTRASOUND;  Surgeon: Karie Soda, MD;  Location: [facility];  Service: General;  Laterality: N/A;   LAPAROSCOPY N/A 08/11/2021   Procedure: LAPAROSCOPIC EXPLORATION;  Surgeon: Karie Soda, MD;  Location: [facility];  Service: General;  Laterality: N/A;   TONSILLECTOMY AND ADENOIDECTOMY      REVIEW OF SYSTEMS:   Review of Systems  Constitutional: Negative for appetite change, chills, fatigue, fever and unexpected weight change.  HENT: Positive for mild scratchy throat. Negative for mouth sores, nosebleeds, sore throat and trouble swallowing.   Eyes: Negative for eye problems and icterus.  Respiratory: Positive for dyspnea and cough. Negative for hemoptysis and wheezing.   Cardiovascular: Negative for chest pain and leg swelling.  Gastrointestinal: Positive for intermittent diarrhea. Negative for abdominal pain, constipation,  nausea and vomiting.  Genitourinary: Negative for bladder incontinence, difficulty urinating, dysuria, frequency and hematuria.   Musculoskeletal: Negative for back pain, gait problem, neck pain and neck stiffness.  Skin: Negative for itching and rash.  Neurological: Negative for dizziness, extremity weakness, gait problem, headaches, light-headedness and seizures.  Hematological: Negative for adenopathy. Does not bruise/bleed easily.  Psychiatric/Behavioral: Negative for confusion, depression and sleep disturbance. The patient is not nervous/anxious.     PHYSICAL EXAMINATION:  There were no vitals taken for  this visit.  ECOG PERFORMANCE STATUS: 1  Physical Exam  Constitutional: Oriented to person, place, and time and well-developed, well-nourished, and in no distress.  HENT:  Head: Normocephalic and atraumatic.  Mouth/Throat: Oropharynx is clear and moist. No oropharyngeal exudate.  Eyes: Conjunctivae are normal. Right eye exhibits no discharge. Left eye exhibits no discharge. No scleral icterus.  Neck: Normal range of motion. Neck supple.  Cardiovascular: mild tachycardia, regular rhythm, normal heart sounds and intact distal pulses.   Pulmonary/Chest: Effort normal and breath sounds normal. No respiratory distress. No wheezes. No rales.  Abdominal: Soft. Bowel sounds are normal. Exhibits no distension and no mass. There is no tenderness.  Musculoskeletal: Normal range of motion. Exhibits no edema.  Lymphadenopathy:    No cervical adenopathy.  Neurological: Alert and oriented to person, place, and time. Exhibits normal muscle tone. Gait normal. Coordination normal.  Skin: Skin is warm and dry. No rash noted. Not diaphoretic. No erythema. No pallor.  Psychiatric: Mood, memory and judgment normal.  Vitals reviewed.  LABORATORY DATA: Lab Results  Component Value Date   WBC 5.7 03/01/2023   HGB 11.9 (L) 03/01/2023   HCT 37.0 03/01/2023   MCV 86.0 03/01/2023   PLT 227 03/01/2023       Chemistry      Component Value Date/Time   NA 142 03/01/2023 1020   NA 139 10/26/2017 1252   K 4.2 03/01/2023 1020   K 4.1 10/26/2017 1252   CL 108 03/01/2023 1020   CO2 28 03/01/2023 1020   CO2 28 10/26/2017 1252   BUN 16 03/01/2023 1020   BUN 15.7 10/26/2017 1252   CREATININE 0.90 03/01/2023 1020   CREATININE 0.95 12/21/2022 1057   CREATININE 1.0 10/26/2017 1252      Component Value Date/Time   CALCIUM 8.9 03/01/2023 1020   CALCIUM 8.8 10/26/2017 1252   ALKPHOS 72 03/01/2023 1020   ALKPHOS 70 10/26/2017 1252   AST 16 03/01/2023 1020   AST 19 10/26/2017 1252   ALT 13 03/01/2023 1020   ALT 22 10/26/2017 1252   BILITOT 0.9 03/01/2023 1020   BILITOT 0.90 10/26/2017 1252       RADIOGRAPHIC STUDIES:  No results found.   ASSESSMENT/PLAN:  This is a very pleasant 77 year old Caucasian female with stage IV non-small cell lung cancer, adenocarcinoma with positive EGFR mutation with deletion in exon 19 and currently undergoing treatment with Gilotrif initially at a dose of 40 mg for 9 months followed by 30 mg by mouth daily status post 84.5 months.  She also underwent SBRT to the enlarging left upper lobe lung nodule.    Her PET scan showed hypermetabolic subcutaneous nodule anterior to the left shoulder musculature concerning for cutaneous metastasis in addition to the enlarging nodule in the retroperitoneum anterior to the left iliacus muscle that also hypermetabolic and concerning for metastatic deposit with enlarging hypermetabolic nodule and consolidation peripherally in the left upper lobe concerning for lung cancer recurrence. She had surgical resection of the soft tissue lesion in the retroperitoneum anterior to the left iliacus muscle and this was consistent with follicular lymphoma.    Her last few CT scans showed showed stable disease except for a slightly enlarging right lower lobe pulmonary nodule as well as soft tissue nodule in the posterior pararenal space.   If further disease progression, Dr. Arbutus Ped would recommend repeat PET scan and probably rebiopsy to rule out any resistant mutation.   Labs were reviewed. Recommend she proceed on the same treatment  at the same dose.   I will arrange for a restaging CT scan in 2 months and follow up a few days later to reassess the slowly enlarging pulmonary nodule.   We will see the patient back for labs and follow up visit a few days after the CT scan. We will review the results at that time.   I will route her CMP from today to her PCP to see if they need to monitor her CMP for her elevated creatinine which likely is from dehydration due to poor oral intake, hot weather, and her new hydrochlorothiazide medication. She was encouraged to hydrate well.   She has a mild scratchy throat. Her husband is getting over a cold. She overall is well appearing and afebrile. I offered to arrange CXR due to her mild increase in cough. She declined but knows to call for re-evaluation sooner if she develops new or worsening symptoms. We can always arrange for same day chest x-ray. Additionally, they can consider covid testing if worsening symptoms.   The patient was advised to call immediately if she has any concerning symptoms in the interval. The patient voices understanding of current disease status and treatment options and is in agreement with the current care plan. All questions were answered. The patient knows to call the clinic with any problems, questions or concerns. We can certainly see the patient much sooner if necessary    No orders of the defined types were placed in this encounter.    The total time spent in the appointment was 20-29 minutes  Sadira Standard L Dannisha Eckmann, PA-C 05/02/23

## 2023-05-04 ENCOUNTER — Ambulatory Visit (HOSPITAL_BASED_OUTPATIENT_CLINIC_OR_DEPARTMENT_OTHER): Payer: Medicare Other | Admitting: Physical Therapy

## 2023-05-07 ENCOUNTER — Other Ambulatory Visit: Payer: Self-pay

## 2023-05-07 ENCOUNTER — Inpatient Hospital Stay: Payer: Medicare Other | Admitting: Physician Assistant

## 2023-05-07 ENCOUNTER — Inpatient Hospital Stay: Payer: Medicare Other | Attending: Internal Medicine

## 2023-05-07 VITALS — BP 139/91 | HR 106 | Temp 98.1°F | Resp 17 | Wt 167.2 lb

## 2023-05-07 DIAGNOSIS — Z90710 Acquired absence of both cervix and uterus: Secondary | ICD-10-CM | POA: Diagnosis not present

## 2023-05-07 DIAGNOSIS — Z9221 Personal history of antineoplastic chemotherapy: Secondary | ICD-10-CM | POA: Insufficient documentation

## 2023-05-07 DIAGNOSIS — I10 Essential (primary) hypertension: Secondary | ICD-10-CM | POA: Diagnosis not present

## 2023-05-07 DIAGNOSIS — C3492 Malignant neoplasm of unspecified part of left bronchus or lung: Secondary | ICD-10-CM

## 2023-05-07 DIAGNOSIS — R059 Cough, unspecified: Secondary | ICD-10-CM | POA: Diagnosis not present

## 2023-05-07 DIAGNOSIS — R61 Generalized hyperhidrosis: Secondary | ICD-10-CM | POA: Diagnosis not present

## 2023-05-07 DIAGNOSIS — E039 Hypothyroidism, unspecified: Secondary | ICD-10-CM | POA: Insufficient documentation

## 2023-05-07 DIAGNOSIS — Z90722 Acquired absence of ovaries, bilateral: Secondary | ICD-10-CM | POA: Insufficient documentation

## 2023-05-07 DIAGNOSIS — K5900 Constipation, unspecified: Secondary | ICD-10-CM | POA: Diagnosis not present

## 2023-05-07 DIAGNOSIS — R197 Diarrhea, unspecified: Secondary | ICD-10-CM | POA: Insufficient documentation

## 2023-05-07 DIAGNOSIS — C3432 Malignant neoplasm of lower lobe, left bronchus or lung: Secondary | ICD-10-CM | POA: Insufficient documentation

## 2023-05-07 DIAGNOSIS — R0989 Other specified symptoms and signs involving the circulatory and respiratory systems: Secondary | ICD-10-CM | POA: Insufficient documentation

## 2023-05-07 DIAGNOSIS — R911 Solitary pulmonary nodule: Secondary | ICD-10-CM | POA: Diagnosis not present

## 2023-05-07 DIAGNOSIS — Z79899 Other long term (current) drug therapy: Secondary | ICD-10-CM | POA: Insufficient documentation

## 2023-05-07 DIAGNOSIS — R0609 Other forms of dyspnea: Secondary | ICD-10-CM | POA: Diagnosis not present

## 2023-05-07 DIAGNOSIS — F419 Anxiety disorder, unspecified: Secondary | ICD-10-CM | POA: Diagnosis not present

## 2023-05-07 DIAGNOSIS — Z923 Personal history of irradiation: Secondary | ICD-10-CM | POA: Insufficient documentation

## 2023-05-07 DIAGNOSIS — R0600 Dyspnea, unspecified: Secondary | ICD-10-CM | POA: Insufficient documentation

## 2023-05-07 DIAGNOSIS — E785 Hyperlipidemia, unspecified: Secondary | ICD-10-CM | POA: Diagnosis not present

## 2023-05-07 DIAGNOSIS — C8298 Follicular lymphoma, unspecified, lymph nodes of multiple sites: Secondary | ICD-10-CM | POA: Insufficient documentation

## 2023-05-07 DIAGNOSIS — Z880 Allergy status to penicillin: Secondary | ICD-10-CM | POA: Insufficient documentation

## 2023-05-07 DIAGNOSIS — R7989 Other specified abnormal findings of blood chemistry: Secondary | ICD-10-CM | POA: Diagnosis not present

## 2023-05-07 LAB — CMP (CANCER CENTER ONLY)
ALT: 19 U/L (ref 0–44)
AST: 23 U/L (ref 15–41)
Albumin: 4 g/dL (ref 3.5–5.0)
Alkaline Phosphatase: 71 U/L (ref 38–126)
Anion gap: 7 (ref 5–15)
BUN: 24 mg/dL — ABNORMAL HIGH (ref 8–23)
CO2: 27 mmol/L (ref 22–32)
Calcium: 9.2 mg/dL (ref 8.9–10.3)
Chloride: 101 mmol/L (ref 98–111)
Creatinine: 1.34 mg/dL — ABNORMAL HIGH (ref 0.44–1.00)
GFR, Estimated: 41 mL/min — ABNORMAL LOW (ref >60)
Glucose, Bld: 92 mg/dL (ref 70–99)
Potassium: 4.3 mmol/L (ref 3.5–5.1)
Sodium: 135 mmol/L (ref 135–145)
Total Bilirubin: 0.8 mg/dL (ref 0.3–1.2)
Total Protein: 6.7 g/dL (ref 6.5–8.1)

## 2023-05-07 LAB — CBC WITH DIFFERENTIAL (CANCER CENTER ONLY)
Abs Immature Granulocytes: 0.03 10*3/uL (ref 0.00–0.07)
Basophils Absolute: 0.1 10*3/uL (ref 0.0–0.1)
Basophils Relative: 1 %
Eosinophils Absolute: 0.2 10*3/uL (ref 0.0–0.5)
Eosinophils Relative: 3 %
HCT: 35.7 % — ABNORMAL LOW (ref 36.0–46.0)
Hemoglobin: 12.2 g/dL (ref 12.0–15.0)
Immature Granulocytes: 0 %
Lymphocytes Relative: 8 %
Lymphs Abs: 0.7 10*3/uL (ref 0.7–4.0)
MCH: 27.9 pg (ref 26.0–34.0)
MCHC: 34.2 g/dL (ref 30.0–36.0)
MCV: 81.5 fL (ref 80.0–100.0)
Monocytes Absolute: 0.9 10*3/uL (ref 0.1–1.0)
Monocytes Relative: 10 %
Neutro Abs: 6.6 10*3/uL (ref 1.7–7.7)
Neutrophils Relative %: 78 %
Platelet Count: 194 10*3/uL (ref 150–400)
RBC: 4.38 MIL/uL (ref 3.87–5.11)
RDW: 13.3 % (ref 11.5–15.5)
WBC Count: 8.5 10*3/uL (ref 4.0–10.5)
nRBC: 0 % (ref 0.0–0.2)

## 2023-05-08 ENCOUNTER — Other Ambulatory Visit: Payer: Self-pay | Admitting: Nurse Practitioner

## 2023-05-08 ENCOUNTER — Encounter: Payer: Self-pay | Admitting: Nurse Practitioner

## 2023-05-08 DIAGNOSIS — N1831 Chronic kidney disease, stage 3a: Secondary | ICD-10-CM

## 2023-05-09 ENCOUNTER — Other Ambulatory Visit (HOSPITAL_COMMUNITY): Payer: Self-pay

## 2023-05-09 ENCOUNTER — Ambulatory Visit (HOSPITAL_BASED_OUTPATIENT_CLINIC_OR_DEPARTMENT_OTHER): Payer: Medicare Other | Admitting: Physical Therapy

## 2023-05-09 ENCOUNTER — Encounter (HOSPITAL_COMMUNITY): Payer: Self-pay

## 2023-05-11 ENCOUNTER — Encounter (HOSPITAL_BASED_OUTPATIENT_CLINIC_OR_DEPARTMENT_OTHER): Payer: Medicare Other

## 2023-05-11 ENCOUNTER — Other Ambulatory Visit (HOSPITAL_COMMUNITY): Payer: Self-pay

## 2023-05-16 ENCOUNTER — Encounter (HOSPITAL_BASED_OUTPATIENT_CLINIC_OR_DEPARTMENT_OTHER): Payer: Medicare Other | Admitting: Physical Therapy

## 2023-05-18 ENCOUNTER — Encounter (HOSPITAL_BASED_OUTPATIENT_CLINIC_OR_DEPARTMENT_OTHER): Payer: Medicare Other | Admitting: Physical Therapy

## 2023-05-21 ENCOUNTER — Ambulatory Visit: Payer: Medicare Other | Admitting: Nurse Practitioner

## 2023-05-22 ENCOUNTER — Ambulatory Visit (INDEPENDENT_AMBULATORY_CARE_PROVIDER_SITE_OTHER): Payer: Medicare Other

## 2023-05-22 DIAGNOSIS — N1831 Chronic kidney disease, stage 3a: Secondary | ICD-10-CM

## 2023-05-23 ENCOUNTER — Encounter (HOSPITAL_BASED_OUTPATIENT_CLINIC_OR_DEPARTMENT_OTHER): Payer: Medicare Other | Admitting: Physical Therapy

## 2023-05-25 ENCOUNTER — Encounter (HOSPITAL_BASED_OUTPATIENT_CLINIC_OR_DEPARTMENT_OTHER): Payer: Medicare Other

## 2023-06-04 ENCOUNTER — Telehealth: Payer: Self-pay | Admitting: Internal Medicine

## 2023-06-04 NOTE — Telephone Encounter (Signed)
Called patient regarding upcoming September appointments, patient is notified.  

## 2023-06-05 ENCOUNTER — Other Ambulatory Visit (HOSPITAL_COMMUNITY): Payer: Self-pay

## 2023-06-11 ENCOUNTER — Ambulatory Visit: Payer: Medicare Other | Admitting: Nurse Practitioner

## 2023-06-11 ENCOUNTER — Encounter: Payer: Self-pay | Admitting: Nurse Practitioner

## 2023-06-11 VITALS — BP 122/64 | HR 91 | Temp 97.5°F | Ht 60.0 in | Wt 165.4 lb

## 2023-06-11 DIAGNOSIS — R110 Nausea: Secondary | ICD-10-CM

## 2023-06-11 DIAGNOSIS — C3492 Malignant neoplasm of unspecified part of left bronchus or lung: Secondary | ICD-10-CM

## 2023-06-11 DIAGNOSIS — I10 Essential (primary) hypertension: Secondary | ICD-10-CM

## 2023-06-11 DIAGNOSIS — R8290 Unspecified abnormal findings in urine: Secondary | ICD-10-CM

## 2023-06-11 DIAGNOSIS — R0982 Postnasal drip: Secondary | ICD-10-CM

## 2023-06-11 DIAGNOSIS — L304 Erythema intertrigo: Secondary | ICD-10-CM | POA: Diagnosis not present

## 2023-06-11 MED ORDER — ONDANSETRON HCL 4 MG PO TABS: 4.0000 mg | ORAL_TABLET | Freq: Every day | ORAL | 1 refills | Status: DC | PRN

## 2023-06-11 MED ORDER — IPRATROPIUM BROMIDE 0.03 % NA SOLN
2.0000 | Freq: Three times a day (TID) | NASAL | 2 refills | Status: DC
Start: 2023-06-11 — End: 2023-08-09

## 2023-06-11 MED ORDER — NYSTATIN 100000 UNIT/GM EX POWD
1.0000 | Freq: Three times a day (TID) | CUTANEOUS | 0 refills | Status: DC
Start: 2023-06-11 — End: 2023-06-26

## 2023-06-11 NOTE — Patient Instructions (Addendum)
Postnasal Drip- use Mucinex DM as needed to thin mucus. Use Atrovent nasal spray to help stop post nasal drip Use Zofran as needed for nausea Postnasal drip is the feeling of mucus going down the back of your throat. Mucus is a slimy substance that moistens and cleans your nose and throat, as well as the air pockets in face bones near your forehead and cheeks (sinuses). Small amounts of mucus pass from your nose and sinuses down the back of your throat all the time. This is normal. When you produce too much mucus or the mucus gets too thick, you can feel it. Some common causes of postnasal drip include: Having more mucus because of: A cold or the flu. Allergies. Cold air. Certain medicines. Gastroesophageal reflux. Having more mucus that is thicker because of: A sinus or nasal infection. Dry air. A food allergy. Follow these instructions at home: Relieving discomfort  Gargle with a mixture of salt and water 3-4 times a day or as needed. To make salt water, completely dissolve -1 tsp (3-6 g) of salt in 1 cup (237 mL) of warm water. If the air in your home is dry, use a humidifier to add moisture to the air. Use a saline spray or a container (neti pot) to flush out the nose (nasal irrigation). These methods can help clear away mucus and keep the nasal passages moist. General instructions Take over-the-counter and prescription medicines only as told by your health care provider. Follow instructions from your health care provider about eating or drinking restrictions. You may need to avoid caffeine. Avoid things that you know you are allergic to (allergens), like dust, mold, pollen, pets, or certain foods. Drink enough fluid to keep your urine pale yellow. Keep all follow-up visits. This is important. Contact a health care provider if: You have a fever. You have a sore throat or difficulty swallowing. You have a headache. You have sinus or ear pain. You have a cough that does not go  away. The mucus from your nose becomes thick and is green or yellow in color. You have cold or flu symptoms that last more than 10 days. Summary Postnasal drip is the feeling of mucus going down the back of your throat. Use nasal irrigation or a nasal spray to help clear away mucus and keep the nasal passages moist. Avoid things that you know you are allergic to (allergens), like dust, mold, pollen, pets, or certain foods. This information is not intended to replace advice given to you by your health care provider. Make sure you discuss any questions you have with your health care provider. Document Revised: 09/08/2021 Document Reviewed: 09/08/2021 Elsevier Patient Education  2024 [employer].

## 2023-06-11 NOTE — Progress Notes (Signed)
Assessment and Plan:  Donelle was seen today for urinary tract infection.  Diagnoses and all orders for this visit:  Adenocarcinoma of left lung, stage 4 (HCC) Continue to follow with oncology Continue Gilotrif  Essential hypertension - continue medications, DASH diet, exercise and monitor at home. Call if greater than 130/80.   Intertrigo Keep area clean and dry Use Nystatin powder TID until healed -     CBC with Differential/Platelet -     nystatin powder; Apply 1 Application topically 3 (three) times daily.  Nausea Use Mucinex and Atrovent nasal spray to possibly decrease post nasal drip and hopefully resolve nausea Can use Zofran as needed for nausea -     ondansetron (ZOFRAN) 4 MG tablet; Take 1 tablet (4 mg total) by mouth daily as needed for nausea or vomiting.  Urine abnormality Will await results of urine to treat -     Urine Culture -     Urinalysis, Routine w reflex microscopic  Post-nasal drip Use Mucinex DM Use atrovent nasal spray as needed -     ipratropium (ATROVENT) 0.03 % nasal spray; Place 2 sprays into the nose 3 (three) times daily.      Further disposition pending results of labs. Discussed med's effects and SE's.   Over 30 minutes of exam, counseling, chart review, and critical decision making was performed.   Future Appointments  Date Time Provider Department Center  06/28/2023  2:00 PM [facility] None  06/28/2023  3:00 PM [facility] 2 [facility] [place]  07/05/2023 11:00 AM Si Gaul, MD [facility] None  07/23/2023 11:30 AM Lucky Cowboy, MD [facility] None  10/22/2023 11:00 AM Raynelle Dick, NP [facility] None  01/21/2024 11:00 AM Lucky Cowboy, MD [facility] None  04/24/2024 11:00 AM Raynelle Dick, NP [facility] None    ------------------------------------------------------------------------------------------------------------------   HPI BP 122/64   Pulse 91   Temp (!) 97.5 F (36.4 C)   Ht 5' (1.524 m)    Wt 165 lb 6.4 oz (75 kg)   SpO2 96%   BMI 32.30 kg/m  77 y.o.female presents for urine is cloudy which began yesterday, denies hematuria, dysuria and frequency. Is having a lot of nausea  She has a rash under her pannus which is red and itches. She has been using regular powder and hydrocortisone with minimal relief.   She has also had post nasal drip with dry cough in the AM.  Body aches and fatigue. She can not cough up any phlegm but drains into stomach and she gets nauseated.   BP well controlled with hydrochlorothiazide 25 mg every day and Losartan 50 mg every day. BP Readings from Last 3 Encounters:  06/11/23 122/64  05/22/23 130/76  05/07/23 (!) 139/91  Denies headaches, chest pain, shortness of breath and dizziness   BMI is Body mass index is 32.3 kg/m., she has not been working on diet and exercise. Wt Readings from Last 3 Encounters:  06/11/23 165 lb 6.4 oz (75 kg)  05/22/23 168 lb (76.2 kg)  05/07/23 167 lb 3.2 oz (75.8 kg)     She has history of stage 4 lung cancer, on Gilotrif maintenance therapy and following with Dr. Arbutus Ped. Recent PET scan showed recurrent tumor with concern for mets. She had biopsy of nodule and referred to Dr. Roselind Messier for consideration of SBRT to these 3 suspicious nodules and she will continue her current treatment with Gilotrif. 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 Shirline Frees, has follow up  05/07/23. If further disease progression, Dr. Arbutus Ped would recommend repeat PET scan and probably rebiopsy to rule out any resistant mutation. Next CT is ordered for 06/2023  Past Medical History:  Diagnosis Date   Adenocarcinoma of left lung, stage 4 (HCC) 04/05/2015   Biopsy confirmed CT SCAN: There are innumerable bilateral pulmonary nodules. These range in size from about 5 mm to 2 cm. They demonstrate irregular indistinct borders, the larger ones demonstrating spiculation. PET SCAN: Diffuse pulmonary metastatic  disease with a 4 cm dominant left lower low lung lesion. No enlarged or hypermetabolic mediastinal or hilar adenopathy.   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   Cancer (HCC)    Change of skin related to chemotherapy 09/25/2017   Drug-induced skin rash 01/17/2016   History of radiation therapy 12/18/2019   SBRT left lung  12/11/2019-12/18/2019   Dr Antony Blackbird   History of radiation therapy 06/21/2021   left lung 06/14/2021-06/21/2021 Dr Antony Blackbird   Hyperlipidemia    Hypertension    Hypothyroidism    Insomnia    Prediabetes    Thyroid disease      Allergies  Allergen Reactions   Penicillins Swelling and Rash    Current Outpatient Medications on File Prior to Visit  Medication Sig   afatinib dimaleate (GILOTRIF) 30 MG tablet TAKE 1 TABLET (30MG ) BY MOUTH ONCE DAILY. TAKE ON AN EMPTY STOMACH 1 HOUR BEFORE OR 2 HOURS AFTER A MEAL   ALPRAZolam (XANAX) 1 MG tablet Take 1 mg by mouth at bedtime.   aspirin 81 MG tablet Take 81 mg by mouth at bedtime.    CELEBREX 200 MG capsule Take 200 mg by mouth daily as needed.   ezetimibe (ZETIA) 10 MG tablet Take 1 tablet (10 mg total) by mouth daily.   hydrochlorothiazide (HYDRODIURIL) 25 MG tablet Take 1 tablet (25 mg total) by mouth daily.   ibuprofen (ADVIL,MOTRIN) 200 MG tablet Take 400 mg by mouth every 6 (six) hours as needed for headache or moderate pain.   levothyroxine (SYNTHROID) 50 MCG tablet Take  1 tablet  Daily  on an empty stomach with only water for 30 minutes & no Antacid meds, Calcium or Magnesium for 4 hours & avoid Biotin                                             /                       TAKE                         BY            MOUTH   losartan (COZAAR) 50 MG tablet Take 1 tablet (50 mg total) by mouth daily.   montelukast (SINGULAIR) 10 MG tablet Take one tablet for allergies   gabapentin (NEURONTIN) 600 MG tablet Take 1/2 to 1 tablet 2 to 3 x / Daily as needed for Pain (Patient not taking:  Reported on 04/20/2023)   meloxicam (MOBIC) 15 MG tablet Take 15 mg by mouth as needed. (Patient not taking: Reported on 06/11/2023)   No current facility-administered medications on file prior to visit.    ROS: all negative except above.   Physical Exam:  BP 122/64   Pulse 91  Temp (!) 97.5 F (36.4 C)   Ht 5' (1.524 m)   Wt 165 lb 6.4 oz (75 kg)   SpO2 96%   BMI 32.30 kg/m   General Appearance: Well nourished, in no apparent distress. Eyes: PERRLA, EOMs, conjunctiva no swelling or erythema Sinuses: No Frontal/maxillary tenderness ENT/Mouth: Ext aud canals clear, TMs without erythema, bulging. No erythema, swelling, or exudate on post pharynx.  Tonsils not swollen or erythematous. Hearing normal.  Neck: Supple, thyroid normal.  Respiratory: Respiratory effort normal, BS equal bilaterally without rales, rhonchi, wheezing or stridor.  Cardio: RRR with no MRGs. Brisk peripheral pulses without edema.  Abdomen: Soft, + BS.  Non tender, no guarding, rebound, hernias, masses. Lymphatics: Non tender without lymphadenopathy.  Musculoskeletal: Full ROM, 5/5 strength, normal gait. Negative CVA tenderness Skin: Warm, dry . Red raised border rash of lower abdomen Neuro: Cranial nerves intact. Normal muscle tone, no cerebellar symptoms. Sensation intact.  Psych: Awake and oriented X 3, normal affect, Insight and Judgment appropriate.     Raynelle Dick, NP 4:15 PM [facility]

## 2023-06-12 ENCOUNTER — Other Ambulatory Visit: Payer: Self-pay | Admitting: Nurse Practitioner

## 2023-06-12 DIAGNOSIS — N3090 Cystitis, unspecified without hematuria: Secondary | ICD-10-CM

## 2023-06-12 LAB — CBC WITH DIFFERENTIAL/PLATELET
Absolute Monocytes: 806 cells/uL (ref 200–950)
Basophils Absolute: 58 {cells}/uL (ref 0–200)
Basophils Relative: 0.6 %
Eosinophils Absolute: 163 {cells}/uL (ref 15–500)
Eosinophils Relative: 1.7 %
HCT: 37 % (ref 35.0–45.0)
Hemoglobin: 11.9 g/dL (ref 11.7–15.5)
Lymphs Abs: 1421 {cells}/uL (ref 850–3900)
MCH: 26.9 pg — ABNORMAL LOW (ref 27.0–33.0)
MCHC: 32.2 g/dL (ref 32.0–36.0)
MCV: 83.7 fL (ref 80.0–100.0)
MPV: 11.3 fL (ref 7.5–12.5)
Monocytes Relative: 8.4 %
Neutro Abs: 7152 {cells}/uL (ref 1500–7800)
Neutrophils Relative %: 74.5 %
Platelets: 246 10*3/uL (ref 140–400)
RBC: 4.42 10*6/uL (ref 3.80–5.10)
RDW: 13.6 % (ref 11.0–15.0)
Total Lymphocyte: 14.8 %
WBC: 9.6 10*3/uL (ref 3.8–10.8)

## 2023-06-12 LAB — URINALYSIS, ROUTINE W REFLEX MICROSCOPIC
Bilirubin Urine: NEGATIVE
Glucose, UA: NEGATIVE
Ketones, ur: NEGATIVE
Nitrite: NEGATIVE
RBC / HPF: NONE SEEN /HPF (ref 0–2)
Specific Gravity, Urine: 1.01 (ref 1.001–1.035)
Squamous Epithelial / HPF: NONE SEEN /HPF (ref <=5)
WBC, UA: >=60 /HPF — AB (ref 0–5)
pH: 5.5 (ref 5.0–8.0)

## 2023-06-12 LAB — URINE CULTURE
MICRO NUMBER:: 15350543
SPECIMEN QUALITY:: ADEQUATE

## 2023-06-12 LAB — MICROSCOPIC MESSAGE

## 2023-06-12 MED ORDER — NITROFURANTOIN MONOHYD MACRO 100 MG PO CAPS
100.0000 mg | ORAL_CAPSULE | Freq: Two times a day (BID) | ORAL | 0 refills | Status: AC
Start: 2023-06-12 — End: 2023-06-19

## 2023-06-13 ENCOUNTER — Other Ambulatory Visit (HOSPITAL_COMMUNITY): Payer: Self-pay

## 2023-06-13 ENCOUNTER — Other Ambulatory Visit: Payer: Self-pay

## 2023-06-23 ENCOUNTER — Other Ambulatory Visit: Payer: Self-pay | Admitting: Nurse Practitioner

## 2023-06-25 ENCOUNTER — Other Ambulatory Visit: Payer: Self-pay | Admitting: Nurse Practitioner

## 2023-06-25 DIAGNOSIS — L304 Erythema intertrigo: Secondary | ICD-10-CM

## 2023-06-26 ENCOUNTER — Other Ambulatory Visit: Payer: Self-pay

## 2023-06-26 ENCOUNTER — Other Ambulatory Visit (HOSPITAL_COMMUNITY): Payer: Self-pay

## 2023-06-26 MED ORDER — EZETIMIBE 10 MG PO TABS
10.0000 mg | ORAL_TABLET | Freq: Every day | ORAL | 3 refills | Status: DC
  Filled 2023-06-26: qty 90, 90d supply, fill #0
  Filled 2023-09-24: qty 90, 90d supply, fill #1
  Filled 2023-12-19: qty 90, 90d supply, fill #2
  Filled 2024-03-20: qty 90, 90d supply, fill #3

## 2023-06-28 ENCOUNTER — Inpatient Hospital Stay: Payer: Medicare Other | Attending: Internal Medicine

## 2023-06-28 ENCOUNTER — Ambulatory Visit (HOSPITAL_COMMUNITY)
Admission: RE | Admit: 2023-06-28 | Discharge: 2023-06-28 | Disposition: A | Discharge status: Home / Self Care | Payer: Medicare Other | Source: Ambulatory Visit | Attending: Physician Assistant | Admitting: Physician Assistant

## 2023-06-28 DIAGNOSIS — K449 Diaphragmatic hernia without obstruction or gangrene: Secondary | ICD-10-CM | POA: Insufficient documentation

## 2023-06-28 DIAGNOSIS — I700 Atherosclerosis of aorta: Secondary | ICD-10-CM | POA: Insufficient documentation

## 2023-06-28 DIAGNOSIS — Z79890 Hormone replacement therapy (postmenopausal): Secondary | ICD-10-CM | POA: Insufficient documentation

## 2023-06-28 DIAGNOSIS — E785 Hyperlipidemia, unspecified: Secondary | ICD-10-CM | POA: Insufficient documentation

## 2023-06-28 DIAGNOSIS — Z880 Allergy status to penicillin: Secondary | ICD-10-CM | POA: Insufficient documentation

## 2023-06-28 DIAGNOSIS — Z923 Personal history of irradiation: Secondary | ICD-10-CM | POA: Insufficient documentation

## 2023-06-28 DIAGNOSIS — Z79899 Other long term (current) drug therapy: Secondary | ICD-10-CM | POA: Insufficient documentation

## 2023-06-28 DIAGNOSIS — Z90710 Acquired absence of both cervix and uterus: Secondary | ICD-10-CM | POA: Insufficient documentation

## 2023-06-28 DIAGNOSIS — C3492 Malignant neoplasm of unspecified part of left bronchus or lung: Secondary | ICD-10-CM | POA: Insufficient documentation

## 2023-06-28 DIAGNOSIS — Z90722 Acquired absence of ovaries, bilateral: Secondary | ICD-10-CM | POA: Insufficient documentation

## 2023-06-28 DIAGNOSIS — R110 Nausea: Secondary | ICD-10-CM | POA: Insufficient documentation

## 2023-06-28 DIAGNOSIS — C3432 Malignant neoplasm of lower lobe, left bronchus or lung: Secondary | ICD-10-CM | POA: Insufficient documentation

## 2023-06-28 DIAGNOSIS — F419 Anxiety disorder, unspecified: Secondary | ICD-10-CM | POA: Insufficient documentation

## 2023-06-28 DIAGNOSIS — J984 Other disorders of lung: Secondary | ICD-10-CM | POA: Insufficient documentation

## 2023-06-28 DIAGNOSIS — E039 Hypothyroidism, unspecified: Secondary | ICD-10-CM | POA: Insufficient documentation

## 2023-06-28 DIAGNOSIS — K5730 Diverticulosis of large intestine without perforation or abscess without bleeding: Secondary | ICD-10-CM | POA: Insufficient documentation

## 2023-06-28 LAB — CMP (CANCER CENTER ONLY)
ALT: 12 U/L (ref 0–44)
AST: 16 U/L (ref 15–41)
Albumin: 3.9 g/dL (ref 3.5–5.0)
Alkaline Phosphatase: 66 U/L (ref 38–126)
Anion gap: 7 (ref 5–15)
BUN: 16 mg/dL (ref 8–23)
CO2: 29 mmol/L (ref 22–32)
Calcium: 8.9 mg/dL (ref 8.9–10.3)
Chloride: 101 mmol/L (ref 98–111)
Creatinine: 1.02 mg/dL — ABNORMAL HIGH (ref 0.44–1.00)
GFR, Estimated: 57 mL/min — ABNORMAL LOW (ref >60)
Glucose, Bld: 85 mg/dL (ref 70–99)
Potassium: 4 mmol/L (ref 3.5–5.1)
Sodium: 137 mmol/L (ref 135–145)
Total Bilirubin: 0.9 mg/dL (ref 0.3–1.2)
Total Protein: 6.8 g/dL (ref 6.5–8.1)

## 2023-06-28 LAB — CBC WITH DIFFERENTIAL (CANCER CENTER ONLY)
Abs Immature Granulocytes: 0.02 10*3/uL (ref 0.00–0.07)
Basophils Absolute: 0.1 10*3/uL (ref 0.0–0.1)
Basophils Relative: 1 %
Eosinophils Absolute: 0.2 10*3/uL (ref 0.0–0.5)
Eosinophils Relative: 3 %
HCT: 34.5 % — ABNORMAL LOW (ref 36.0–46.0)
Hemoglobin: 11.5 g/dL — ABNORMAL LOW (ref 12.0–15.0)
Immature Granulocytes: 0 %
Lymphocytes Relative: 16 %
Lymphs Abs: 1.1 10*3/uL (ref 0.7–4.0)
MCH: 27.6 pg (ref 26.0–34.0)
MCHC: 33.3 g/dL (ref 30.0–36.0)
MCV: 82.9 fL (ref 80.0–100.0)
Monocytes Absolute: 0.6 10*3/uL (ref 0.1–1.0)
Monocytes Relative: 8 %
Neutro Abs: 4.9 10*3/uL (ref 1.7–7.7)
Neutrophils Relative %: 72 %
Platelet Count: 236 10*3/uL (ref 150–400)
RBC: 4.16 MIL/uL (ref 3.87–5.11)
RDW: 13.5 % (ref 11.5–15.5)
WBC Count: 6.8 10*3/uL (ref 4.0–10.5)
nRBC: 0 % (ref 0.0–0.2)

## 2023-06-28 MED ORDER — SODIUM CHLORIDE (PF) 0.9 % IJ SOLN
INTRAMUSCULAR | Status: AC
  Filled 2023-06-28: qty 50

## 2023-06-28 MED ORDER — IOHEXOL 300 MG/ML  SOLN
100.0000 mL | Freq: Once | INTRAMUSCULAR | Status: AC | PRN
  Administered 2023-06-28: 100 mL via INTRAVENOUS

## 2023-07-02 ENCOUNTER — Other Ambulatory Visit: Payer: Self-pay | Admitting: Nurse Practitioner

## 2023-07-05 ENCOUNTER — Other Ambulatory Visit (HOSPITAL_COMMUNITY): Payer: Self-pay

## 2023-07-05 ENCOUNTER — Other Ambulatory Visit: Payer: Self-pay | Admitting: Internal Medicine

## 2023-07-05 ENCOUNTER — Inpatient Hospital Stay (HOSPITAL_BASED_OUTPATIENT_CLINIC_OR_DEPARTMENT_OTHER): Payer: Medicare Other | Admitting: Internal Medicine

## 2023-07-05 VITALS — BP 119/66 | HR 74 | Temp 97.6°F | Resp 16 | Ht 60.0 in | Wt 166.9 lb

## 2023-07-05 DIAGNOSIS — R110 Nausea: Secondary | ICD-10-CM | POA: Diagnosis not present

## 2023-07-05 DIAGNOSIS — E039 Hypothyroidism, unspecified: Secondary | ICD-10-CM | POA: Diagnosis not present

## 2023-07-05 DIAGNOSIS — C3492 Malignant neoplasm of unspecified part of left bronchus or lung: Secondary | ICD-10-CM

## 2023-07-05 DIAGNOSIS — E785 Hyperlipidemia, unspecified: Secondary | ICD-10-CM | POA: Diagnosis not present

## 2023-07-05 DIAGNOSIS — Z79899 Other long term (current) drug therapy: Secondary | ICD-10-CM | POA: Diagnosis not present

## 2023-07-05 DIAGNOSIS — K449 Diaphragmatic hernia without obstruction or gangrene: Secondary | ICD-10-CM | POA: Diagnosis not present

## 2023-07-05 DIAGNOSIS — K5730 Diverticulosis of large intestine without perforation or abscess without bleeding: Secondary | ICD-10-CM | POA: Diagnosis not present

## 2023-07-05 DIAGNOSIS — Z90722 Acquired absence of ovaries, bilateral: Secondary | ICD-10-CM | POA: Diagnosis not present

## 2023-07-05 DIAGNOSIS — Z79890 Hormone replacement therapy (postmenopausal): Secondary | ICD-10-CM | POA: Diagnosis not present

## 2023-07-05 DIAGNOSIS — I700 Atherosclerosis of aorta: Secondary | ICD-10-CM | POA: Diagnosis not present

## 2023-07-05 DIAGNOSIS — C3432 Malignant neoplasm of lower lobe, left bronchus or lung: Secondary | ICD-10-CM | POA: Diagnosis present

## 2023-07-05 DIAGNOSIS — J984 Other disorders of lung: Secondary | ICD-10-CM | POA: Diagnosis not present

## 2023-07-05 DIAGNOSIS — Z90710 Acquired absence of both cervix and uterus: Secondary | ICD-10-CM | POA: Diagnosis not present

## 2023-07-05 DIAGNOSIS — F419 Anxiety disorder, unspecified: Secondary | ICD-10-CM | POA: Diagnosis not present

## 2023-07-05 DIAGNOSIS — Z923 Personal history of irradiation: Secondary | ICD-10-CM | POA: Diagnosis not present

## 2023-07-05 DIAGNOSIS — Z880 Allergy status to penicillin: Secondary | ICD-10-CM | POA: Diagnosis not present

## 2023-07-05 MED ORDER — AFATINIB DIMALEATE 30 MG PO TABS
ORAL_TABLET | ORAL | 2 refills | Status: DC
  Filled 2023-07-06: qty 30, 30d supply, fill #0
  Filled 2023-08-07: qty 30, 30d supply, fill #1
  Filled 2023-09-03: qty 30, 30d supply, fill #2

## 2023-07-05 NOTE — Progress Notes (Signed)
Gilmer Mor, DO  Leodis Rains D OK for CT guided retroperitoneal mass/nodule, posterior to right kidney.  CT 06/28/23, image 66 series 2  [address]

## 2023-07-05 NOTE — Progress Notes (Signed)
[facility] [phone]   [phone]  OFFICE PROGRESS NOTE  Lucky Cowboy, MD [address] [place] [address]  DIAGNOSIS: Stage IV (T2a, N0, M1a) non-small cell lung cancer, adenocarcinoma with positive EGFR mutation with deletion in exon 19 diagnosed in June 2016 and presented with large mass in the left lower lobe in addition to multiple bilateral pulmonary nodules  PRIOR THERAPY:  1) Gilotrif 40 mg by mouth daily started 05/11/2015, status post 9 months of treatment discontinued on 02/17/2016 secondary to extensive skin rash. 2) SBRT to the enlarging left upper lobe nodule under the care of Dr. Mitzi Hansen.  CURRENT THERAPY: Gilotrif 30 mg by mouth daily started 02/21/2016 status post 82 months of treatment.  INTERVAL HISTORY: Elizabeth [name] 77 y.o. female returns to the clinic today for follow-up visit.  The patient is feeling fine today with no concerning complaints except for fatigue and occasional diarrhea.  She is Imodium on as-needed basis.  She denied having any chest pain, shortness of breath, cough or hemoptysis.  She has intermittent episode of nausea with no vomiting, abdominal pain or constipation.  She has no headache or visual changes.  She denied having any recent weight loss or night sweats.  She continues to tolerate her treatment with GILOTRIF fairly well.  She is here for evaluation and repeat CT scan of the chest, abdomen and pelvis for restaging of her disease.  MEDICAL HISTORY: Past Medical History:  Diagnosis Date   Adenocarcinoma of left lung, stage 4 (HCC) 04/05/2015   Biopsy confirmed CT SCAN: There are innumerable bilateral pulmonary nodules. These range in size from about 5 mm to 2 cm. They demonstrate irregular indistinct borders, the larger ones demonstrating spiculation. PET SCAN: Diffuse pulmonary metastatic disease with a 4 cm dominant left lower low lung lesion. No enlarged or hypermetabolic  mediastinal or hilar adenopathy.   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  MEDICATIONS:  Current Outpatient Medications  Medication Sig Dispense Refill   afatinib dimaleate (GILOTRIF) 30 MG tablet TAKE 1 TABLET (30MG ) BY MOUTH ONCE DAILY. TAKE ON AN EMPTY STOMACH 1 HOUR BEFORE OR 2 HOURS AFTER A MEAL 30 tablet 2   ALPRAZolam (XANAX) 1 MG tablet Take 1 mg by mouth at bedtime.     aspirin 81 MG tablet Take 81 mg by mouth at bedtime.      CELEBREX 200 MG capsule Take 200 mg by mouth daily as needed.     ezetimibe (ZETIA) 10 MG tablet Take 1 tablet (10 mg total) by mouth daily. 90 tablet 3   hydrochlorothiazide (HYDRODIURIL) 25 MG tablet Take 1 tablet (25 mg total) by mouth daily. 30 tablet 3   ibuprofen (ADVIL,MOTRIN) 200 MG tablet Take 400 mg by mouth every 6 (six) hours as needed for headache or moderate pain.     ipratropium (ATROVENT) 0.03 % nasal spray Place 2 sprays into the nose 3 (  three) times daily. 30 mL 2   levothyroxine (SYNTHROID) 50 MCG tablet Take  1 tablet  Daily  on an empty stomach with only water for 30 minutes & no Antacid meds, Calcium or Magnesium for 4 hours & avoid Biotin                                             /                       TAKE                         BY            MOUTH 90 tablet 3   losartan (COZAAR) 50 MG tablet Take 1 tablet (50 mg total) by mouth daily. 30 tablet 11   montelukast (SINGULAIR) 10 MG tablet TAKE 1 TABLET BY MOUTH FOR ALLERGIES 90 tablet 3   NYSTATIN powder APPLY TOPICALLY  THREE TIMES DAILY 15 g 0   ondansetron (ZOFRAN) 4 MG tablet Take 1 tablet (4 mg total) by mouth daily as needed for nausea or vomiting. 30 tablet 1   No current facility-administered medications for this visit.    SURGICAL HISTORY:  Past Surgical History:  Procedure Laterality Date   ABDOMINAL HYSTERECTOMY  10/23/1993   w BSO   CYST REMOVAL HAND Right    EXCISION MASS ABDOMINAL N/A 08/11/2021   Procedure: EXCISIONAL BIOPSY OF LEFT RETROPERITONEAL MASS, POSSIBLE INTRAOPERATIVE LAPAROSCOPIC ULTRASOUND;  Surgeon: Karie Soda, MD;  Location: [facility];  Service: General;  Laterality: N/A;   LAPAROSCOPY N/A 08/11/2021   Procedure: LAPAROSCOPIC EXPLORATION;  Surgeon: Karie Soda, MD;  Location: [facility];  Service: General;  Laterality: N/A;   TONSILLECTOMY AND ADENOIDECTOMY      REVIEW OF SYSTEMS:  Constitutional: positive for fatigue Eyes: negative Ears, nose, mouth, throat, and face: negative Respiratory: negative Cardiovascular: negative Gastrointestinal: positive for diarrhea and nausea Genitourinary:negative Integument/breast: negative Hematologic/lymphatic: negative Musculoskeletal:negative Neurological: negative Behavioral/Psych: negative Endocrine: negative Allergic/Immunologic: negative   PHYSICAL EXAMINATION: General appearance: alert, cooperative, fatigued, and no distress Head: Normocephalic, without obvious abnormality, atraumatic Neck: no adenopathy, no JVD, supple, symmetrical, trachea midline, and thyroid not enlarged, symmetric, no tenderness/mass/nodules Lymph nodes: Cervical, supraclavicular, and axillary nodes normal. Resp: clear to auscultation bilaterally Back: symmetric, no curvature. ROM normal. No CVA tenderness. Cardio: regular rate and rhythm, S1, S2 normal, no murmur, click, rub or gallop GI: soft, non-tender; bowel sounds normal; no masses,  no organomegaly Extremities: extremities normal, atraumatic, no cyanosis or edema Neurologic: Alert and oriented  X 3, normal strength and tone. Normal symmetric reflexes. Normal coordination and gait  ECOG PERFORMANCE STATUS: 1 - Symptomatic but completely ambulatory  Blood pressure 119/66, pulse 74, temperature 97.6 F (36.4 C), temperature source Oral, resp. rate 16, height 5' (1.524 m), weight 166 lb 14.4 oz (75.7 kg), SpO2 100%.  LABORATORY DATA: Lab Results  Component Value Date   WBC 6.8 06/28/2023   HGB 11.5 (L) 06/28/2023   HCT 34.5 (L) 06/28/2023   MCV 82.9 06/28/2023   PLT 236 06/28/2023      Chemistry      Component Value Date/Time   NA 137 06/28/2023 1329   NA 139 10/26/2017 1252   K 4.0 06/28/2023 1329   K 4.1 10/26/2017 1252   CL 101  06/28/2023 1329   CO2 29 06/28/2023 1329   CO2 28 10/26/2017 1252   BUN 16 06/28/2023 1329   BUN 15.7 10/26/2017 1252   CREATININE 1.02 (H) 06/28/2023 1329   CREATININE 0.94 05/22/2023 1158   CREATININE 1.0 10/26/2017 1252      Component Value Date/Time   CALCIUM 8.9 06/28/2023 1329   CALCIUM 8.8 10/26/2017 1252   ALKPHOS 66 06/28/2023 1329   ALKPHOS 70 10/26/2017 1252   AST 16 06/28/2023 1329   AST 19 10/26/2017 1252   ALT 12 06/28/2023 1329   ALT 22 10/26/2017 1252   BILITOT 0.9 06/28/2023 1329   BILITOT 0.90 10/26/2017 1252       RADIOGRAPHIC STUDIES: CT CHEST ABDOMEN PELVIS W CONTRAST  Result Date: 07/02/2023 CLINICAL DATA:  Metastatic lung cancer restaging * Tracking Code: BO * EXAM: CT CHEST, ABDOMEN, AND PELVIS WITH CONTRAST TECHNIQUE: Multidetector CT imaging of the chest, abdomen and pelvis was performed following the standard protocol during bolus administration of intravenous contrast. RADIATION DOSE REDUCTION: This exam was performed according to the departmental dose-optimization program which includes automated exposure control, adjustment of the mA and/or kV according to patient size and/or use of iterative reconstruction technique. CONTRAST:  OMNIPAQUE IOHEXOL 300 MG/ML  SOLN COMPARISON:  03/01/2023 FINDINGS: CT  CHEST FINDINGS Cardiovascular: Scattered aortic atherosclerosis. Normal heart size. No pericardial effusion. Mediastinum/Nodes: No enlarged mediastinal, hilar, or axillary lymph nodes. Small hiatal hernia. Thyroid gland, trachea, and esophagus demonstrate no significant findings. Lungs/Pleura: Unchanged treated mass of the perihilar left lung with associated scarring and volume loss (series 4, image 74). Unchanged subsolid nodule of the right lower lobe (series 4, image 74). Unchanged irregular subsolid nodule just posteriorly measuring 1.0 x 0.6 cm (series 4, image 69). Numerous additional irregular and ground-glass opacities throughout the right lung, not significantly changed. Unchanged irregular nodular consolidation of the medial left apex measuring 1.6 x 0.8 cm (series 4, image 24). No pleural effusion or pneumothorax. Musculoskeletal: No chest wall abnormality. No acute osseous findings. Nonacute, partially callused left rib fractures (series 2, image 17). CT ABDOMEN PELVIS FINDINGS Hepatobiliary: No solid liver abnormality is seen. Unchanged, benign hemangioma of the posterior liver dome, requiring no specific further follow-up or characterization. Contracted gallbladder. No gallstones, gallbladder wall thickening, or biliary dilatation. Pancreas: Unremarkable. No pancreatic ductal dilatation or surrounding inflammatory changes. Spleen: Normal in size without significant abnormality. Adrenals/Urinary Tract: Adrenal glands are unremarkable. Kidneys are normal, without renal calculi, solid lesion, or hydronephrosis. Bladder is unremarkable. Stomach/Bowel: Stomach is within normal limits. Appendix appears normal. No evidence of bowel wall thickening, distention, or inflammatory changes. Descending and sigmoid diverticulosis. Vascular/Lymphatic: Aortic atherosclerosis. No enlarged abdominal or pelvic lymph nodes. Reproductive: Status post hysterectomy. Other: No abdominal wall hernia or abnormality. No ascites.  Significant enlargement of a retroperitoneal nodule posterior to the right kidney measuring 2.1 x 1.7 cm, previously 1.5 x 1.2 cm (series 2, image 66). Unchanged, ill-defined nodule in the right retroperitoneum in the vicinity of the paracolic gutter measuring 0.7 cm (series 2, image 74). Previously described left hemiabdominal peritoneal nodule not clearly appreciated on today's examination (series 2, image 79). Musculoskeletal: No acute osseous findings. IMPRESSION: 1. Unchanged treated mass of the perihilar left lung with associated scarring and volume loss of the left lung. 2. Multiple right lung nodules unchanged, as well as a background of irregular and ground-glass opacities. Unchanged nodular consolidation of the medial left apex. Attention on follow-up. 3. Significant enlargement of a retroperitoneal nodule posterior to the right  kidney measuring 2.1 x 1.7 cm, previously 1.5 x 1.2 cm, consistent with a worsened soft tissue metastasis. Other small nodules unchanged or not clearly appreciated. Aortic Atherosclerosis (ICD10-I70.0). Electronically Signed   By: Jearld Lesch M.D.   On: 07/02/2023 07:27     ASSESSMENT AND PLAN:  This is a very pleasant 77 years old white female with stage IV non-small cell lung cancer, adenocarcinoma with positive EGFR mutation with deletion in exon 19 and currently undergoing treatment with Gilotrif initially at a dose of 40 mg for 9 months followed by 30 mg by mouth daily status post 82 months.  She also underwent SBRT to the enlarging left upper lobe lung nodule. Her PET scan showed hypermetabolic subcutaneous nodule anterior to the left shoulder musculature concerning for cutaneous metastasis in addition to the enlarging nodule in the retroperitoneum anterior to the left iliacus muscle that also hypermetabolic and concerning for metastatic deposit with enlarging hypermetabolic nodule and consolidation peripherally in the left upper lobe concerning for lung cancer  recurrence. She had surgical resection of the soft tissue lesion in the retroperitoneum anterior to the left iliacus muscle and this was consistent with follicular lymphoma. The patient has been tolerating her treatment with GILOTRIF fairly well with no concerning adverse effects except for occasional nausea and few episodes of diarrhea. She had repeat CT scan of the chest, abdomen and pelvis performed recently.  I personally and independently reviewed the scan images and discussed the result with the patient today. Her scan showed no concerning findings for disease progression except for enlargement of her retroperitoneal nodule posterior to the right kidney currently measuring 2.1 x 1.7 cm. I recommended for the patient to continue her current treatment with GILOTRIF but I will send her to IR for consideration of CT-guided core biopsy of the enlarging retroperitoneal nodule to rule out any disease progression or another follicular lymphoma. The patient will come back for follow-up visit in 1 months for evaluation and repeat blood work and discussion of her scan results. She was advised to call if she has any other concerning symptoms in the interval. The patient voices understanding of current disease status and treatment options and is in agreement with the current care plan. All questions were answered. The patient knows to call the clinic with any problems, questions or concerns. We can certainly see the patient much sooner if necessary. The total time spent in the appointment was 35 minutes.  Disclaimer: This note was dictated with voice recognition software. Similar sounding words can inadvertently be transcribed and may not be corrected upon review.

## 2023-07-06 ENCOUNTER — Other Ambulatory Visit: Payer: Self-pay

## 2023-07-22 ENCOUNTER — Encounter: Payer: Self-pay | Admitting: Internal Medicine

## 2023-07-22 NOTE — Patient Instructions (Signed)

## 2023-07-22 NOTE — Progress Notes (Unsigned)
Future Appointments  Date Time Provider Department  07/23/2023                  6 mo ov 11:30 AM Lucky Cowboy, MD [facility]  08/09/2023  1:45 PM Si Gaul, MD [facility]  10/22/2023                  9 mo 11:00 AM Raynelle Dick, NP [facility]  01/21/2024                  cpe 11:00 AM Lucky Cowboy, MD [facility]  04/24/2024                  wellness 11:00 AM Raynelle Dick, NP [facility]    History of Present Illness:      This very nice 77 y.o. MWF with HTN, HLD, Prediabetes  and Vitamin D Deficiency  who presents for 6 month follow up with HTN, HLD, Pre-Diabetes and Vitamin D Deficiency.  Abd CT scan in 2021 revealed Aortic Atherosclerosis .                                                      Patient has been on ongoing Tx with Gilotrif for Stage IV non small cell lung cancer since 2016  by Dr Arbutus Ped. She also has been dx'd with an indolent Follicular Lymphoma.          Patient is treated for HTN (1997)  & BP has been controlled at home. Today's BP is at goal - 120/70 .    Patient has had no complaints of any cardiac type chest pain, palpitations, dyspnea Pollyann Kennedy /PND, dizziness, claudication or dependent edema.        Hyperlipidemia is controlled with diet & meds. Patient denies myalgias or other med SE's. Last Lipids were near goal :  Lab Results  Component Value Date   CHOL 170 04/20/2023   HDL 50 04/20/2023   LDLCALC 101 (H) 04/20/2023   TRIG 95 04/20/2023   CHOLHDL 3.4 04/20/2023     Also, the patient has history of  PreDiabetes  (A1c 5.7% /2012) and has had no symptoms of reactive hypoglycemia, diabetic polys, paresthesias or visual blurring.  Last A1c was not at goal :  Lab Results  Component Value Date   HGBA1C 6.0 (H) 12/21/2022          Patient has been on thyroid replacement  since 1996 when dx'd Hypothyroid.                                                     Further, the patient also has history of Vitamin D Deficiency  ("34" /2016) and supplements vitamin D.   Last vitamin D was at goal :  Lab Results  Component Value Date   VD25OH 73 12/21/2022      Current Outpatient Medications on File Prior to Visit  Medication Sig   afatinib (GILOTRIF) 30 MG  TAKE 1 TABLET  DAILY   ALPRAZolam (XANAX) 1 MG tablet Take at bedtime.   aspirin 81 MG tablet Take at bedtime.    CELEBREX 200 MG capsule Take daily as needed.  ezetimibe 10 MG tablet Take 1 tablet  daily.   hydrochlorothiazide 25 MG tablet Take 1 tablet  daily.   ibuprofen  200 MG tablet Take 400 mg every 6  hours as needed    ipratropium 0.03 % nasal spray Place 2 sprays into the nose 3  times daily.   levothyroxine  50 MCG tablet Take  1 tablet  Daily     losartan  50 MG tablet Take 1 tablet daily.   montelukast 10 MG tablet TAKE 1 TABLET  FOR ALLERGIES   NYSTATIN powder APPLY TOPICALLY THREE TIMES DAILY   ondansetron  4 MG tablet Take 1 tablet  daily as needed for nausea or vomiting.     Allergies  Allergen Reactions   Penicillins Swelling and Rash     PMHx:   Past Medical History:  Diagnosis Date   Adenocarcinoma of left lung, stage 4 (HCC) 04/05/2015   Biopsy confirmed CT SCAN: There are innumerable bilateral pulmonary nodules. These range in size from about 5 mm to 2 cm. They demonstrate irregular indistinct borders, the larger ones demonstrating spiculation. PET SCAN: Diffuse pulmonary metastatic disease with a 4 cm dominant left lower low lung lesion. No enlarged or hypermetabolic mediastinal or hilar adenopathy.   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   Cancer (HCC)    Change of skin related to chemotherapy 09/25/2017   Drug-induced skin rash 01/17/2016   History of radiation therapy 12/18/2019   SBRT left lung  12/11/2019-12/18/2019   Dr Antony Blackbird   History of radiation therapy 06/21/2021   left lung 06/14/2021-06/21/2021 Dr Antony Blackbird   Hyperlipidemia    Hypertension    Hypothyroidism     Insomnia    Prediabetes    Thyroid disease      Immunization History  Administered Date(s) Administered   Influenza Split 07/16/2018   Influenza, High Dose  07/09/2019, 07/07/2020, 08/02/2022, 08/02/2022   Influenza 09/13/2015, 07/23/2016, 08/06/2017, 07/23/2018   Moderna Covid-19 Bival Booster 09/09/2021   Moderna Sars-Covid-2 Vacc 12/05/2019, 01/03/2020, 07/26/2020, 05/16/2021   Pneumococcal -13 12/24/2015   Pneumococcal -23 07/22/2012   Pneumococcal-23 07/22/2012   Td 12/25/2005   Zoster, Live 01/09/2007     Past Surgical History:  Procedure Laterality Date   ABDOMINAL HYSTERECTOMY  10/23/1993   w BSO   CYST REMOVAL HAND Right    EXCISION MASS ABDOMINAL N/A 08/11/2021   Procedure: EXCISIONAL BIOPSY OF LEFT RETROPERITONEAL MASS, POSSIBLE INTRAOPERATIVE LAPAROSCOPIC ULTRASOUND;  Surgeon: Karie Soda, MD;  Location: [facility];  Service: General;  Laterality: N/A;   LAPAROSCOPY N/A 08/11/2021   Procedure: LAPAROSCOPIC EXPLORATION;  Surgeon: Karie Soda, MD;  Location: [facility];  Service: General;  Laterality: N/A;   TONSILLECTOMY AND ADENOIDECTOMY      FHx:    Reviewed / unchanged   SHx:    Reviewed / unchanged    Systems Review:  Constitutional: Denies fever, chills, wt changes, headaches, insomnia, fatigue, night sweats, change in appetite. Eyes: Denies redness, blurred vision, diplopia, discharge, itchy, watery eyes.  ENT: Denies discharge, congestion, post nasal drip, epistaxis, sore throat, earache, hearing loss, dental pain, tinnitus, vertigo, sinus pain, snoring.  CV: Denies chest pain, palpitations, irregular heartbeat, syncope, dyspnea, diaphoresis, orthopnea, PND, claudication or edema. Respiratory: denies cough, dyspnea, DOE, pleurisy, hoarseness, laryngitis, wheezing.  Gastrointestinal: Denies dysphagia, odynophagia, heartburn, reflux, water brash, abdominal pain or cramps, nausea, vomiting, bloating, diarrhea, constipation, hematemesis, melena, hematochezia   or hemorrhoids. Genitourinary: Denies dysuria, frequency, urgency, nocturia,  hesitancy, discharge, hematuria or flank pain. Musculoskeletal: Denies arthralgias, myalgias, stiffness, jt. swelling, pain, limping or strain/sprain.  Skin: Denies pruritus, rash, hives, warts, acne, eczema or change in skin lesion(s). Neuro: No weakness, tremor, incoordination, spasms, paresthesia or pain. Psychiatric: Denies confusion, memory loss or sensory loss. Endo: Denies change in weight, skin or hair change.  Heme/Lymph: No excessive bleeding, bruising or enlarged lymph nodes.   Physical Exam  BP 120/70   Pulse 82   Temp 97.9 F (36.6 C)   Resp 16   Ht 5' (1.524 m)   Wt 168 lb 6.4 oz (76.4 kg)   SpO2 99%   BMI 32.89 kg/m   Appears  well nourished, well groomed  and in no distress.  Eyes: PERRLA, EOMs, conjunctiva no swelling or erythema. Sinuses: No frontal/maxillary tenderness ENT/Mouth: EAC's clear, TM's nl w/o erythema, bulging. Nares clear w/o erythema, swelling, exudates. Oropharynx clear without erythema or exudates. Oral hygiene is good. Tongue normal, non obstructing. Hearing intact.  Neck: Supple. Thyroid not palpable. Car 2+/2+ without bruits, nodes or JVD. Chest: Respirations nl with BS clear & equal w/o rales, rhonchi, wheezing or stridor.  Cor: Heart sounds normal w/ regular rate and rhythm without sig. murmurs, gallops, clicks or rubs. Peripheral pulses normal and equal  without edema.  Abdomen: Soft & bowel sounds normal. Non-tender w/o guarding, rebound, hernias, masses or organomegaly.  Lymphatics: Unremarkable.  Musculoskeletal: Full ROM all peripheral extremities, joint stability, 5/5 strength and normal gait.  Skin: Warm, dry without exposed rashes, lesions or ecchymosis apparent.  Neuro: Cranial nerves intact, reflexes equal bilaterally. Sensory-motor testing grossly intact. Tendon reflexes grossly intact.  Pysch: Alert & oriented x 3.  Insight and judgement nl &  appropriate. No ideations.   Assessment and Plan:  1. Essential hypertension  - Continue medication, monitor blood pressure at home.  - Continue DASH diet.  Reminder to go to the ER if any CP,  SOB, nausea, dizziness, severe HA, changes vision/speech.    - Magnesium - TSH   2. Hyperlipidemia, mixed  - Continue diet/meds, exercise,& lifestyle modifications.  - Continue monitor periodic cholesterol/liver & renal functions     - Lipid panel - TSH   3. Abnormal glucose  - Continue diet, exercise  - Lifestyle modifications.  - Monitor appropriate labs    - Hemoglobin A1c - Insulin, random   4. Hypothyroidism  - TSH   5. Vitamin D deficiency  - Continue supplementation    - VITAMIN D 25 Hydroxy    6. Aortic atherosclerosis (HCC) by Abd CT scan on 01/26/2-021  - Lipid panel   7. Adenocarcinoma of left lung, stage 4 (HCC)   8. Medication management  - Magnesium - Lipid panel - TSH - Hemoglobin A1c - Insulin, random - VITAMIN D 25 Hydroxy         Discussed  regular exercise, BP monitoring, weight control to achieve/maintain BMI less than 25 and discussed med and SE's. Recommended labs to assess /monitor clinical status .  I discussed the assessment and treatment plan with the patient. The patient was provided an opportunity to ask questions and all were answered. The patient agreed with the plan and demonstrated an understanding of the instructions.  I provided over 30 minutes of exam, counseling, chart review and  complex critical decision making.      The patient was advised to call back or seek an in-person evaluation if the symptoms worsen or if the condition fails to improve as anticipated.  Marinus Maw, MD

## 2023-07-23 ENCOUNTER — Encounter: Payer: Self-pay | Admitting: Internal Medicine

## 2023-07-23 ENCOUNTER — Ambulatory Visit (INDEPENDENT_AMBULATORY_CARE_PROVIDER_SITE_OTHER): Payer: Medicare Other | Admitting: Internal Medicine

## 2023-07-23 VITALS — BP 120/70 | HR 82 | Temp 97.9°F | Resp 16 | Ht 60.0 in | Wt 168.4 lb

## 2023-07-23 DIAGNOSIS — E559 Vitamin D deficiency, unspecified: Secondary | ICD-10-CM

## 2023-07-23 DIAGNOSIS — I10 Essential (primary) hypertension: Secondary | ICD-10-CM | POA: Diagnosis not present

## 2023-07-23 DIAGNOSIS — E039 Hypothyroidism, unspecified: Secondary | ICD-10-CM

## 2023-07-23 DIAGNOSIS — R7309 Other abnormal glucose: Secondary | ICD-10-CM

## 2023-07-23 DIAGNOSIS — I700 Atherosclerosis of aorta: Secondary | ICD-10-CM

## 2023-07-23 DIAGNOSIS — E782 Mixed hyperlipidemia: Secondary | ICD-10-CM

## 2023-07-23 DIAGNOSIS — C3492 Malignant neoplasm of unspecified part of left bronchus or lung: Secondary | ICD-10-CM

## 2023-07-23 DIAGNOSIS — Z79899 Other long term (current) drug therapy: Secondary | ICD-10-CM

## 2023-07-24 LAB — LIPID PANEL
Cholesterol: 163 mg/dL (ref <200)
HDL: 55 mg/dL (ref >=50)
LDL Cholesterol (Calc): 91 mg/dL
Non-HDL Cholesterol (Calc): 108 mg/dL (ref <130)
Total CHOL/HDL Ratio: 3 (calc) (ref <5.0)
Triglycerides: 81 mg/dL (ref <150)

## 2023-07-24 LAB — INSULIN, RANDOM: Insulin: 58 u[IU]/mL — ABNORMAL HIGH

## 2023-07-24 LAB — MAGNESIUM: Magnesium: 2 mg/dL (ref 1.5–2.5)

## 2023-07-24 LAB — TSH: TSH: 2.17 m[IU]/L (ref 0.40–4.50)

## 2023-07-24 LAB — HEMOGLOBIN A1C
Hgb A1c MFr Bld: 5.8 %{Hb} — ABNORMAL HIGH (ref <5.7)
Mean Plasma Glucose: 120 mg/dL
eAG (mmol/L): 6.6 mmol/L

## 2023-07-24 LAB — VITAMIN D 25 HYDROXY (VIT D DEFICIENCY, FRACTURES): Vit D, 25-Hydroxy: 77 ng/mL (ref 30–100)

## 2023-07-24 NOTE — Progress Notes (Signed)
<>*<>*<>*<>*<>*<>*<>*<>*<>*<>*<>*<>*<>*<>*<>*<>*<>*<>*<>*<>*<>*<>*<>*<>*<> <>*<>*<>*<>*<>*<>*<>*<>*<>*<>*<>*<>*<>*<>*<>*<>*<>*<>*<>*<>*<>*<>*<>*<>*<>  -Test results slightly outside the reference range are not unusual. If there is anything important, I will review this with you,  otherwise it is considered normal test values.  If you have further questions,  please do not hesitate to contact me at the office or via My Chart.   <>*<>*<>*<>*<>*<>*<>*<>*<>*<>*<>*<>*<>*<>*<>*<>*<>*<>*<>*<>*<>*<>*<>*<>*<> <>*<>*<>*<>*<>*<>*<>*<>*<>*<>*<>*<>*<>*<>*<>*<>*<>*<>*<>*<>*<>*<>*<>*<>*<>  -  A1c = 5.8% - better ( normal is less than 5.7%) and was 6.0% - So Better  !   So  . . . . . 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  <>*<>*<>*<>*<>*<>*<>*<>*<>*<>*<>*<>*<>*<>*<>*<>*<>*<>*<>*<>*<>*<>*<>*<>*<> <>*<>*<>*<>*<>*<>*<>*<>*<>*<>*<>*<>*<>*<>*<>*<>*<>*<>*<>*<>*<>*<>*<>*<>*<>  -  Chol = 163 -  Excellent   - Very low risk for Heart Attack  / Stroke - > continue Zetia  !   <>*<>*<>*<>*<>*<>*<>*<>*<>*<>*<>*<>*<>*<>*<>*<>*<>*<>*<>*<>*<>*<>*<>*<>*<> <>*<>*<>*<>*<>*<>*<>*<>*<>*<>*<>*<>*<>*<>*<>*<>*<>*<>*<>*<>*<>*<>*<>*<>*<>  -  Vitamin D = 77 - > Excellent   - Vitamin D goal is between 70-100.   - Please continue  your Vitamin D dosage SAME   - It is very important as a  natural anti-inflammatory and helping the                   immune system protect against viral infections, like the Covid-19    helping hair, skin, and nails, as well as reducing stroke and heart attack risk, flu, etc  - It helps your bones and helps with mood.  - It also decreases numerous cancer risks so please                                                                                           take it as directed.   - Low Vit D is associated with a 200-300% higher risk for CANCER   and 200-300% higher risk for HEART   ATTACK  &  STROKE.    - It is also associated with higher death rate at younger ages,   autoimmune diseases like Rheumatoid arthritis, Lupus, Multiple Sclerosis.     - Also many other serious conditions, like Depression, Alzheimer's                                             Dementia,  muscle aches, fatigue, fibromyalgia   <>*<>*<>*<>*<>*<>*<>*<>*<>*<>*<>*<>*<>*<>*<>*<>*<>*<>*<>*<>*<>*<>*<>*<>*<> <>*<>*<>*<>*<>*<>*<>*<>*<>*<>*<>*<>*<>*<>*<>*<>*<>*<>*<>*<>*<>*<>*<>*<>*<>  - All Else - CBC - Kidneys - Electrolytes - Liver - Magnesium & Thyroid    - all  Normal / OK  <>*<>*<>*<>*<>*<>*<>*<>*<>*<>*<>*<>*<>*<>*<>*<>*<>*<>*<>*<>*<>*<>*<>*<>*<> <>*<>*<>*<>*<>*<>*<>*<>*<>*<>*<>*<>*<>*<>*<>*<>*<>*<>*<>*<>*<>*<>*<>*<>*<>

## 2023-07-26 ENCOUNTER — Other Ambulatory Visit: Payer: Self-pay | Admitting: Radiology

## 2023-07-26 DIAGNOSIS — C3492 Malignant neoplasm of unspecified part of left bronchus or lung: Secondary | ICD-10-CM

## 2023-07-26 NOTE — H&P (Signed)
Referring Physician(s): Mohamed,Mohamed  Supervising Physician: Simonne Come  Patient Status:  [facility]  Chief Complaint:  " I'm here for  a biopsy"  Subjective: Patient familiar to IR team from superior segment left lower lobe lung mass biopsy in 2016, and biopsy of superficial soft tissue nodule overlying left shoulder musculature in 2022.  She is a 77 year old female with past medical history significant for anxiety, non-Hodgkin's lymphoma/follicular lymphoma, hyperlipidemia, hypertension, hypothyroidism, prediabetes, and stage IV lung adenocarcinoma.  She underwent recent CT chest abdomen pelvis on 06/28/2023 which revealed:   1. Unchanged treated mass of the perihilar left lung with associated scarring and volume loss of the left lung. 2. Multiple right lung nodules unchanged, as well as a background of irregular and ground-glass opacities. Unchanged nodular consolidation of the medial left apex. Attention on follow-up. 3. Significant enlargement of a retroperitoneal nodule posterior to the right kidney measuring 2.1 x 1.7 cm, previously 1.5 x 1.2 cm, consistent with a worsened soft tissue metastasis. Other small nodules unchanged or not clearly appreciated.  She presents today for CT-guided biopsy of the enlarging right retroperitoneal mass/nodule. She denies fever,HA,CP, dyspnea, cough, abd pain, N/V or bleeding. She does have back pain.    Past Medical History:  Diagnosis Date   Adenocarcinoma of left lung, stage 4 (HCC) 04/05/2015   Biopsy confirmed CT SCAN: There are innumerable bilateral pulmonary nodules. These range in size from about 5 mm to 2 cm. They demonstrate irregular indistinct borders, the larger ones demonstrating spiculation. PET SCAN: Diffuse pulmonary metastatic disease with a 4 cm dominant left lower low lung lesion. No enlarged or hypermetabolic mediastinal or hilar adenopathy.   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   Cancer  (HCC)    Change of skin related to chemotherapy 09/25/2017   Drug-induced skin rash 01/17/2016   History of radiation therapy 12/18/2019   SBRT left lung  12/11/2019-12/18/2019   Dr Antony Blackbird   History of radiation therapy 06/21/2021   left lung 06/14/2021-06/21/2021 Dr Antony Blackbird   Hyperlipidemia    Hypertension    Hypothyroidism    Insomnia    Prediabetes    Thyroid disease    Past Surgical History:  Procedure Laterality Date   ABDOMINAL HYSTERECTOMY  10/23/1993   w BSO   CYST REMOVAL HAND Right    EXCISION MASS ABDOMINAL N/A 08/11/2021   Procedure: EXCISIONAL BIOPSY OF LEFT RETROPERITONEAL MASS, POSSIBLE INTRAOPERATIVE LAPAROSCOPIC ULTRASOUND;  Surgeon: Karie Soda, MD;  Location: [facility];  Service: General;  Laterality: N/A;   LAPAROSCOPY N/A 08/11/2021   Procedure: LAPAROSCOPIC EXPLORATION;  Surgeon: Karie Soda, MD;  Location: [facility];  Service: General;  Laterality: N/A;   TONSILLECTOMY AND ADENOIDECTOMY        Aortic Atherosclerosis Allergies: Penicillins  Medications: Prior to Admission medications   Medication Sig Start Date End Date Taking? Authorizing Provider  afatinib dimaleate (GILOTRIF) 30 MG tablet TAKE 1 TABLET (30MG ) BY MOUTH ONCE DAILY. TAKE ON AN EMPTY STOMACH 1 HOUR BEFORE OR 2 HOURS AFTER A MEAL 07/05/23 07/04/24  Si Gaul, MD  ALPRAZolam Prudy Feeler) 1 MG tablet Take 1 mg by mouth at bedtime.    [provider]  aspirin 81 MG tablet Take 81 mg by mouth at bedtime.     [provider]  CELEBREX 200 MG capsule Take 200 mg by mouth daily as needed. 05/09/23   [provider]  ezetimibe (ZETIA) 10 MG tablet Take 1 tablet (10  mg total) by mouth daily. 06/26/23   Raynelle Dick, NP  hydrochlorothiazide (HYDRODIURIL) 25 MG tablet Take 1 tablet (25 mg total) by mouth daily. 04/20/23   Raynelle Dick, NP  ibuprofen (ADVIL,MOTRIN) 200 MG tablet Take 400 mg by mouth every 6 (six) hours as needed for headache or moderate pain.     [provider]  ipratropium (ATROVENT) 0.03 % nasal spray Place 2 sprays into the nose 3 (three) times daily. 06/11/23 06/10/24  Raynelle Dick, NP  levothyroxine (SYNTHROID) 50 MCG tablet Take  1 tablet  Daily  on an empty stomach with only water for 30 minutes & no Antacid meds, Calcium or Magnesium for 4 hours & avoid Biotin                                             /                       TAKE                         BY            MOUTH 02/11/23   Lucky Cowboy, MD  losartan (COZAAR) 50 MG tablet Take 1 tablet (50 mg total) by mouth daily. 09/20/22 09/20/23  Raynelle Dick, NP  montelukast (SINGULAIR) 10 MG tablet TAKE 1 TABLET BY MOUTH FOR ALLERGIES 07/02/23   Raynelle Dick, NP  NYSTATIN powder APPLY TOPICALLY THREE TIMES DAILY 06/26/23   Raynelle Dick, NP  ondansetron (ZOFRAN) 4 MG tablet Take 1 tablet (4 mg total) by mouth daily as needed for nausea or vomiting. 06/11/23 06/10/24  Raynelle Dick, NP     Vital Signs: Vitals:   07/27/23 0713  BP: 134/66  Pulse: 74  Resp: 16  Temp: 97.6 F (36.4 C)  SpO2: 93%      Code Status: FULL CODE  Physical Exam Vitals reviewed.  Constitutional:      Appearance: Normal appearance.  Cardiovascular:     Rate and Rhythm: Normal rate and regular rhythm.     Pulses: Normal pulses.  Pulmonary:     Effort: Pulmonary effort is normal.     Breath sounds: Normal breath sounds. No wheezing.  Abdominal:     General: Bowel sounds are normal. There is no distension.     Palpations: Abdomen is soft.     Tenderness: There is no abdominal tenderness.  Musculoskeletal:        General: No swelling.     Right lower leg: No edema.     Left lower leg: No edema.  Skin:    General: Skin is warm and dry.  Neurological:     Mental Status: She is alert.  Psychiatric:        Mood and Affect: Mood normal.        Behavior: Behavior normal.     Imaging: No results found.  Labs:  CBC: Recent Labs    03/01/23 1020  05/07/23 1345 06/11/23 1632 06/28/23 1329  WBC 5.7 8.5 9.6 6.8  HGB 11.9* 12.2 11.9 11.5*  HCT 37.0 35.7* 37.0 34.5*  PLT 227 194 246 236    COAGS: No results for input(s): "INR", "APTT" in the last 8760 hours.  BMP: Recent Labs    11/09/22 0927 12/21/22 1057  03/01/23 1020 05/07/23 1345 05/22/23 1158 06/28/23 1329  NA 141   < > 142 135 136 137  K 4.2   < > 4.2 4.3 3.8 4.0  CL 107   < > 108 101 100 101  CO2 27   < > 28 27 29 29   GLUCOSE 80   < > 96 92 83 85  BUN 17   < > 16 24* 18 16  CALCIUM 9.1   < > 8.9 9.2 8.6 8.9  CREATININE 0.89   < > 0.90 1.34* 0.94 1.02*  GFRNONAA >60  --  >60 41*  --  57*   < > = values in this interval not displayed.    LIVER FUNCTION TESTS: Recent Labs    11/09/22 6962 12/21/22 1057 03/01/23 1020 05/07/23 1345 06/28/23 1329  BILITOT 0.7 0.8 0.9 0.8 0.9  AST 18 24 16 23 16   ALT 13 26 13 19 12   ALKPHOS 65  --  72 71 66  PROT 6.4* 6.7 7.1 6.7 6.8  ALBUMIN 3.7  --  4.0 4.0 3.9    Assessment and Plan: 77 year old female with past medical history significant for anxiety, non-Hodgkin's lymphoma/follicular lymphoma(2022), hyperlipidemia, hypertension, hypothyroidism, prediabetes, and stage IV lung adenocarcinoma (2016).  She underwent recent CT chest abdomen pelvis on 06/28/2023 which revealed:   1. Unchanged treated mass of the perihilar left lung with associated scarring and volume loss of the left lung. 2. Multiple right lung nodules unchanged, as well as a background of irregular and ground-glass opacities. Unchanged nodular consolidation of the medial left apex. Attention on follow-up. 3. Significant enlargement of a retroperitoneal nodule posterior to the right kidney measuring 2.1 x 1.7 cm, previously 1.5 x 1.2 cm, consistent with a worsened soft tissue metastasis. Other small nodules unchanged or not clearly appreciated.  She presents today for CT-guided biopsy of the enlarging right retroperitoneal mass/nodule.Risks and benefits  of procedure was discussed with the patient including, but not limited to bleeding, infection, damage to adjacent structures or low yield requiring additional tests.  All of the questions were answered and there is agreement to proceed.  Consent signed and in chart.    Electronically Signed: D. Jeananne Rama, PA-C/Teresa Crite,NP 07/26/2023, 3:02 PM   I spent a total of 25 minutes at the the patient's bedside AND on the patient's hospital floor or unit, greater than 50% of which was counseling/coordinating care for CT-guided biopsy of right retroperitoneal mass/nodule

## 2023-07-27 ENCOUNTER — Ambulatory Visit (HOSPITAL_COMMUNITY)
Admission: RE | Admit: 2023-07-27 | Discharge: 2023-07-27 | Disposition: A | Discharge status: Home / Self Care | Payer: Medicare Other | Source: Ambulatory Visit | Attending: Internal Medicine | Admitting: Internal Medicine

## 2023-07-27 ENCOUNTER — Ambulatory Visit (HOSPITAL_COMMUNITY)
Admission: RE | Admit: 2023-07-27 | Discharge: 2023-07-27 | Disposition: A | Discharge status: Home / Self Care | Payer: Medicare Other | Source: Ambulatory Visit | Attending: Emergency Medicine | Admitting: Emergency Medicine

## 2023-07-27 ENCOUNTER — Encounter (HOSPITAL_COMMUNITY): Payer: Self-pay

## 2023-07-27 DIAGNOSIS — C3492 Malignant neoplasm of unspecified part of left bronchus or lung: Secondary | ICD-10-CM | POA: Diagnosis present

## 2023-07-27 DIAGNOSIS — E039 Hypothyroidism, unspecified: Secondary | ICD-10-CM | POA: Insufficient documentation

## 2023-07-27 DIAGNOSIS — F419 Anxiety disorder, unspecified: Secondary | ICD-10-CM | POA: Diagnosis not present

## 2023-07-27 DIAGNOSIS — M549 Dorsalgia, unspecified: Secondary | ICD-10-CM | POA: Diagnosis not present

## 2023-07-27 DIAGNOSIS — I10 Essential (primary) hypertension: Secondary | ICD-10-CM | POA: Insufficient documentation

## 2023-07-27 DIAGNOSIS — Z8572 Personal history of non-Hodgkin lymphomas: Secondary | ICD-10-CM | POA: Insufficient documentation

## 2023-07-27 DIAGNOSIS — E785 Hyperlipidemia, unspecified: Secondary | ICD-10-CM | POA: Diagnosis not present

## 2023-07-27 DIAGNOSIS — R1909 Other intra-abdominal and pelvic swelling, mass and lump: Secondary | ICD-10-CM | POA: Diagnosis not present

## 2023-07-27 LAB — CBC WITH DIFFERENTIAL/PLATELET
Abs Immature Granulocytes: 0.02 10*3/uL (ref 0.00–0.07)
Basophils Absolute: 0 10*3/uL (ref 0.0–0.1)
Basophils Relative: 1 %
Eosinophils Absolute: 0.2 10*3/uL (ref 0.0–0.5)
Eosinophils Relative: 3 %
HCT: 34.9 % — ABNORMAL LOW (ref 36.0–46.0)
Hemoglobin: 11.4 g/dL — ABNORMAL LOW (ref 12.0–15.0)
Immature Granulocytes: 0 %
Lymphocytes Relative: 20 %
Lymphs Abs: 1.4 10*3/uL (ref 0.7–4.0)
MCH: 27.3 pg (ref 26.0–34.0)
MCHC: 32.7 g/dL (ref 30.0–36.0)
MCV: 83.7 fL (ref 80.0–100.0)
Monocytes Absolute: 0.8 10*3/uL (ref 0.1–1.0)
Monocytes Relative: 12 %
Neutro Abs: 4.3 10*3/uL (ref 1.7–7.7)
Neutrophils Relative %: 64 %
Platelets: 212 10*3/uL (ref 150–400)
RBC: 4.17 MIL/uL (ref 3.87–5.11)
RDW: 13.4 % (ref 11.5–15.5)
WBC: 6.8 10*3/uL (ref 4.0–10.5)
nRBC: 0 % (ref 0.0–0.2)

## 2023-07-27 LAB — BASIC METABOLIC PANEL
Anion gap: 11 (ref 5–15)
BUN: 18 mg/dL (ref 8–23)
CO2: 23 mmol/L (ref 22–32)
Calcium: 8.8 mg/dL — ABNORMAL LOW (ref 8.9–10.3)
Chloride: 98 mmol/L (ref 98–111)
Creatinine, Ser: 1.03 mg/dL — ABNORMAL HIGH (ref 0.44–1.00)
GFR, Estimated: 56 mL/min — ABNORMAL LOW (ref >60)
Glucose, Bld: 91 mg/dL (ref 70–99)
Potassium: 3.9 mmol/L (ref 3.5–5.1)
Sodium: 132 mmol/L — ABNORMAL LOW (ref 135–145)

## 2023-07-27 LAB — PROTIME-INR
INR: 1 (ref 0.8–1.2)
Prothrombin Time: 13 s (ref 11.4–15.2)

## 2023-07-27 MED ORDER — FENTANYL CITRATE (PF) 100 MCG/2ML IJ SOLN
INTRAMUSCULAR | Status: AC | PRN
Start: 2023-07-27 — End: 2023-07-27
  Administered 2023-07-27 (×3): 25 ug via INTRAVENOUS

## 2023-07-27 MED ORDER — MIDAZOLAM HCL 2 MG/2ML IJ SOLN
INTRAMUSCULAR | Status: AC | PRN
Start: 2023-07-27 — End: 2023-07-27
  Administered 2023-07-27 (×3): .5 mg via INTRAVENOUS

## 2023-07-27 MED ORDER — MIDAZOLAM HCL 2 MG/2ML IJ SOLN
INTRAMUSCULAR | Status: AC
  Filled 2023-07-27: qty 2

## 2023-07-27 MED ORDER — SODIUM CHLORIDE 0.9 % IV SOLN: INTRAVENOUS | Status: DC

## 2023-07-27 MED ORDER — FENTANYL CITRATE (PF) 100 MCG/2ML IJ SOLN
INTRAMUSCULAR | Status: AC
  Filled 2023-07-27: qty 2

## 2023-07-27 NOTE — Procedures (Signed)
Pre procedural Dx: Retroperitoneal lymphadenopathy Post procedural Dx: Same  Technically successful CT guided biopsy of enlarging right sided retroperitoneal lymph node.   EBL: None.  Complications: None immediate.   Katherina Right, MD Pager #: [phone]

## 2023-07-31 LAB — SURGICAL PATHOLOGY

## 2023-08-07 ENCOUNTER — Other Ambulatory Visit (HOSPITAL_COMMUNITY): Payer: Self-pay | Admitting: Pharmacy Technician

## 2023-08-07 ENCOUNTER — Other Ambulatory Visit (HOSPITAL_COMMUNITY): Payer: Self-pay

## 2023-08-07 NOTE — Progress Notes (Signed)
Specialty Pharmacy Refill Coordination Note  Elizabeth [name] is a 77 y.o. female contacted today regarding refills of specialty medication(s) Afatinib Dimaleate   Patient requested Delivery   Delivery date: 08/14/23   Verified address: [address]  [place]   Medication will be filled on 08/13/23.

## 2023-08-09 ENCOUNTER — Other Ambulatory Visit: Payer: Self-pay

## 2023-08-09 ENCOUNTER — Inpatient Hospital Stay: Payer: Medicare Other | Admitting: Internal Medicine

## 2023-08-09 ENCOUNTER — Inpatient Hospital Stay: Payer: Medicare Other | Attending: Internal Medicine

## 2023-08-09 VITALS — BP 136/80 | HR 80 | Temp 97.6°F | Resp 17 | Ht 60.0 in | Wt 168.0 lb

## 2023-08-09 DIAGNOSIS — C8290 Follicular lymphoma, unspecified, unspecified site: Secondary | ICD-10-CM | POA: Diagnosis not present

## 2023-08-09 DIAGNOSIS — Z90710 Acquired absence of both cervix and uterus: Secondary | ICD-10-CM | POA: Insufficient documentation

## 2023-08-09 DIAGNOSIS — Z923 Personal history of irradiation: Secondary | ICD-10-CM | POA: Insufficient documentation

## 2023-08-09 DIAGNOSIS — F419 Anxiety disorder, unspecified: Secondary | ICD-10-CM | POA: Insufficient documentation

## 2023-08-09 DIAGNOSIS — E785 Hyperlipidemia, unspecified: Secondary | ICD-10-CM | POA: Diagnosis not present

## 2023-08-09 DIAGNOSIS — I10 Essential (primary) hypertension: Secondary | ICD-10-CM | POA: Diagnosis not present

## 2023-08-09 DIAGNOSIS — Z90722 Acquired absence of ovaries, bilateral: Secondary | ICD-10-CM | POA: Insufficient documentation

## 2023-08-09 DIAGNOSIS — C3432 Malignant neoplasm of lower lobe, left bronchus or lung: Secondary | ICD-10-CM | POA: Diagnosis present

## 2023-08-09 DIAGNOSIS — C3492 Malignant neoplasm of unspecified part of left bronchus or lung: Secondary | ICD-10-CM

## 2023-08-09 DIAGNOSIS — Z79899 Other long term (current) drug therapy: Secondary | ICD-10-CM | POA: Diagnosis not present

## 2023-08-09 DIAGNOSIS — E039 Hypothyroidism, unspecified: Secondary | ICD-10-CM | POA: Insufficient documentation

## 2023-08-09 DIAGNOSIS — Z880 Allergy status to penicillin: Secondary | ICD-10-CM | POA: Insufficient documentation

## 2023-08-09 LAB — CBC WITH DIFFERENTIAL (CANCER CENTER ONLY)
Abs Immature Granulocytes: 0.02 10*3/uL (ref 0.00–0.07)
Basophils Absolute: 0.1 10*3/uL (ref 0.0–0.1)
Basophils Relative: 1 %
Eosinophils Absolute: 0.2 10*3/uL (ref 0.0–0.5)
Eosinophils Relative: 3 %
HCT: 35.6 % — ABNORMAL LOW (ref 36.0–46.0)
Hemoglobin: 11.8 g/dL — ABNORMAL LOW (ref 12.0–15.0)
Immature Granulocytes: 0 %
Lymphocytes Relative: 17 %
Lymphs Abs: 1.2 10*3/uL (ref 0.7–4.0)
MCH: 27.6 pg (ref 26.0–34.0)
MCHC: 33.1 g/dL (ref 30.0–36.0)
MCV: 83.2 fL (ref 80.0–100.0)
Monocytes Absolute: 0.7 10*3/uL (ref 0.1–1.0)
Monocytes Relative: 10 %
Neutro Abs: 4.8 10*3/uL (ref 1.7–7.7)
Neutrophils Relative %: 69 %
Platelet Count: 240 10*3/uL (ref 150–400)
RBC: 4.28 MIL/uL (ref 3.87–5.11)
RDW: 13.5 % (ref 11.5–15.5)
WBC Count: 7 10*3/uL (ref 4.0–10.5)
nRBC: 0 % (ref 0.0–0.2)

## 2023-08-09 LAB — CMP (CANCER CENTER ONLY)
ALT: 14 U/L (ref 0–44)
AST: 18 U/L (ref 15–41)
Albumin: 4 g/dL (ref 3.5–5.0)
Alkaline Phosphatase: 69 U/L (ref 38–126)
Anion gap: 6 (ref 5–15)
BUN: 15 mg/dL (ref 8–23)
CO2: 29 mmol/L (ref 22–32)
Calcium: 9.2 mg/dL (ref 8.9–10.3)
Chloride: 101 mmol/L (ref 98–111)
Creatinine: 0.98 mg/dL (ref 0.44–1.00)
GFR, Estimated: 60 mL/min — ABNORMAL LOW (ref >60)
Glucose, Bld: 90 mg/dL (ref 70–99)
Potassium: 4.3 mmol/L (ref 3.5–5.1)
Sodium: 136 mmol/L (ref 135–145)
Total Bilirubin: 0.7 mg/dL (ref 0.3–1.2)
Total Protein: 7.1 g/dL (ref 6.5–8.1)

## 2023-08-09 NOTE — Addendum Note (Signed)
Addended by: Charma Igo on: 08/09/2023 04:30 PM   Modules accepted: Orders

## 2023-08-09 NOTE — Progress Notes (Signed)
[facility] Telephone:([phone]) [phone]   Fax:([phone]  OFFICE PROGRESS NOTE  Elizabeth Cowboy, MD [address] [place] [address]  DIAGNOSIS: Stage IV (T2a, N0, M1a) non-small cell lung cancer, adenocarcinoma with positive EGFR mutation with deletion in exon 19 diagnosed in June 2016 and presented with large mass in the left lower lobe in addition to multiple bilateral pulmonary nodules  PRIOR THERAPY:  1) Gilotrif 40 mg by mouth daily started 05/11/2015, status post 9 months of treatment discontinued on 02/17/2016 secondary to extensive skin rash. 2) SBRT to the enlarging left upper lobe nodule under the care of Dr. Mitzi Hansen.  CURRENT THERAPY: Gilotrif 30 mg by mouth daily started 02/21/2016 status post 89 months of treatment.  INTERVAL HISTORY: Elizabeth [name] 77 y.o. female returns to the clinic today for follow-up visit.Discussed the use of AI scribe software for clinical note transcription with the patient, who gave verbal consent to proceed.  History of Present Illness   Elizabeth [name], a 77 year old patient with a history of stage four non-small cell lung cancer (adenocarcinoma with positive EGFR mutation), was diagnosed in June 2016. Treatment with Gilotrif was initiated in July 2016, initially at 40mg  but later reduced to 30mg  due to side effects. She has been on this treatment since then.  She reports handling the treatment well, with no significant complaints. However, she does experience some side effects, including occasional diarrhea and a runny nose. She is unsure if the runny nose is a side effect of the Gilotrif, but it is manageable and does not require medication.  Previously, she experienced a rash, which has since resolved, with only occasional bumps appearing. She denies any breathing difficulties, except for shortness of breath during long walks. There are no reports of chest pain or coughing. Her weight has remained  stable.  In addition to lung cancer, she has a history of follicular lymphoma. A recent scan revealed a nodule in the retroperitoneal area, which was biopsied and found to be similar to her previous lymphoma. She reports no symptoms related to this lymphoma.  Her lab work has been satisfactory, and she has been managing her conditions with her current treatment regimen.       MEDICAL HISTORY: Past Medical History:  Diagnosis Date   Adenocarcinoma of left lung, stage 4 (HCC) 04/05/2015   Biopsy confirmed CT SCAN: There are innumerable bilateral pulmonary nodules. These range in size from about 5 mm to 2 cm. They demonstrate irregular indistinct borders, the larger ones demonstrating spiculation. PET SCAN: Diffuse pulmonary metastatic disease with a 4 cm dominant left lower low lung lesion. No enlarged or hypermetabolic mediastinal or hilar adenopathy.   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  MEDICATIONS:  Current Outpatient Medications  Medication Sig Dispense Refill   afatinib dimaleate (GILOTRIF) 30 MG tablet TAKE 1 TABLET (30MG ) BY MOUTH ONCE DAILY. TAKE ON AN EMPTY STOMACH 1 HOUR BEFORE OR  2 HOURS AFTER A MEAL 30 tablet 2   ALPRAZolam (XANAX) 1 MG tablet Take 1 mg by mouth at bedtime.     aspirin 81 MG tablet Take 81 mg by mouth at bedtime.      CELEBREX 200 MG capsule Take 200 mg by mouth daily as needed.     ezetimibe (ZETIA) 10 MG tablet Take 1 tablet (10 mg total) by mouth daily. 90 tablet 3   hydrochlorothiazide  (HYDRODIURIL) 25 MG tablet Take 1 tablet (25 mg total) by mouth daily. 30 tablet 3   ibuprofen (ADVIL,MOTRIN) 200 MG tablet Take 400 mg by mouth every 6 (six) hours as needed for headache or moderate pain.     ipratropium (ATROVENT) 0.03 % nasal spray Place 2 sprays into the nose 3 (three) times daily. 30 mL 2   levothyroxine (SYNTHROID) 50 MCG tablet Take  1 tablet  Daily  on an empty stomach with only water for 30 minutes & no Antacid meds, Calcium or Magnesium for 4 hours & avoid Biotin                                             /                       TAKE                         BY            MOUTH 90 tablet 3   losartan (COZAAR) 50 MG tablet Take 1 tablet (50 mg total) by mouth daily. 30 tablet 11   montelukast (SINGULAIR) 10 MG tablet TAKE 1 TABLET BY MOUTH FOR ALLERGIES 90 tablet 3   NYSTATIN powder APPLY TOPICALLY THREE TIMES DAILY 15 g 0   ondansetron (ZOFRAN) 4 MG tablet Take 1 tablet (4 mg total) by mouth daily as needed for nausea or vomiting. 30 tablet 1   No current facility-administered medications for this visit.    SURGICAL HISTORY:  Past Surgical History:  Procedure Laterality Date   ABDOMINAL HYSTERECTOMY  10/23/1993   w BSO   CYST REMOVAL HAND Right    EXCISION MASS ABDOMINAL N/A 08/11/2021   Procedure: EXCISIONAL BIOPSY OF LEFT RETROPERITONEAL MASS, POSSIBLE INTRAOPERATIVE LAPAROSCOPIC ULTRASOUND;  Surgeon: Karie Soda, MD;  Location: [facility];  Service: General;  Laterality: N/A;   LAPAROSCOPY N/A 08/11/2021   Procedure: LAPAROSCOPIC EXPLORATION;  Surgeon: Karie Soda, MD;  Location: [facility];  Service: General;  Laterality: N/A;   TONSILLECTOMY AND ADENOIDECTOMY      REVIEW OF SYSTEMS:  Constitutional: negative Eyes: negative Ears, nose, mouth, throat, and face: negative Respiratory: negative Cardiovascular: negative Gastrointestinal: positive for diarrhea Genitourinary:negative Integument/breast: negative Hematologic/lymphatic:  negative Musculoskeletal:negative Neurological: negative Behavioral/Psych: negative Endocrine: negative Allergic/Immunologic: negative   PHYSICAL EXAMINATION: General appearance: alert, cooperative, and no distress Head: Normocephalic, without obvious abnormality, atraumatic Neck: no adenopathy, no JVD, supple, symmetrical, trachea midline, and thyroid not enlarged, symmetric, no tenderness/mass/nodules Lymph nodes: Cervical, supraclavicular, and axillary nodes normal. Resp: clear to auscultation bilaterally Back: symmetric, no curvature. ROM normal. No CVA tenderness. Cardio: regular rate and rhythm, S1, S2 normal, no murmur, click, rub or gallop GI: soft, non-tender; bowel sounds normal; no masses,  no organomegaly Extremities: extremities normal, atraumatic, no cyanosis or edema Neurologic: Alert and oriented X 3,  normal strength and tone. Normal symmetric reflexes. Normal coordination and gait  ECOG PERFORMANCE STATUS: 1 - Symptomatic but completely ambulatory  Blood pressure 136/80, pulse 80, temperature 97.6 F (36.4 C), temperature source Oral, resp. rate 17, height 5' (1.524 m), weight 168 lb (76.2 kg), SpO2 100%.  LABORATORY DATA: Lab Results  Component Value Date   WBC 7.0 08/09/2023   HGB 11.8 (L) 08/09/2023   HCT 35.6 (L) 08/09/2023   MCV 83.2 08/09/2023   PLT 240 08/09/2023      Chemistry      Component Value Date/Time   NA 132 (L) 07/27/2023 0727   NA 139 10/26/2017 1252   K 3.9 07/27/2023 0727   K 4.1 10/26/2017 1252   CL 98 07/27/2023 0727   CO2 23 07/27/2023 0727   CO2 28 10/26/2017 1252   BUN 18 07/27/2023 0727   BUN 15.7 10/26/2017 1252   CREATININE 1.03 (H) 07/27/2023 0727   CREATININE 1.02 (H) 06/28/2023 1329   CREATININE 0.94 05/22/2023 1158   CREATININE 1.0 10/26/2017 1252      Component Value Date/Time   CALCIUM 8.8 (L) 07/27/2023 0727   CALCIUM 8.8 10/26/2017 1252   ALKPHOS 66 06/28/2023 1329   ALKPHOS 70 10/26/2017 1252   AST 16  06/28/2023 1329   AST 19 10/26/2017 1252   ALT 12 06/28/2023 1329   ALT 22 10/26/2017 1252   BILITOT 0.9 06/28/2023 1329   BILITOT 0.90 10/26/2017 1252       RADIOGRAPHIC STUDIES: CT ABDOMINAL MASS BIOPSY  Result Date: 07/27/2023 INDICATION: History of lung cancer, now with enlarging right-sided retroperitoneal nodule worrisome for metastatic disease. Please perform CT-guided biopsy for diagnostic purposes. EXAM: CT-GUIDED RIGHT RETROPERITONEAL NODULE BIOPSY COMPARISON:  CT of the chest, abdomen and pelvis-06/28/2019; 03/01/2023 MEDICATIONS: None. ANESTHESIA/SEDATION: Moderate (conscious) sedation was employed during this procedure as administered by the Interventional Radiology RN. A total of Versed 1.5 mg and Fentanyl 75 mcg was administered intravenously. Moderate Sedation Time: 21 minutes. The patient's level of consciousness and vital signs were monitored continuously by radiology nursing throughout the procedure under my direct supervision. CONTRAST:  None. COMPLICATIONS: None immediate. PROCEDURE: Informed consent was obtained from the patient following an explanation of the procedure, risks, benefits and alternatives. A time out was performed prior to the initiation of the procedure. The patient was positioned prone on the CT table and a limited CT was performed for procedural planning demonstrating grossly unchanged size and appearance of adjacent retroperitoneal nodules with dominant 2.1 x 2.0 cm right-sided retroperitoneal nodule (image 45, series 2) and adjacent nodule measuring 1.3 x 1.2 cm (image 42, series 2). The CT gantry table position was marked however neither nodule was not confidently identified with ultrasound. As such, the decision was made to target the dominant retroperitoneal nodule with CT guidance. The procedure was planned. The operative site was prepped and draped in the usual sterile fashion. Appropriate trajectory was confirmed with a 22 gauge spinal needle after the  adjacent tissues were anesthetized with 1% Lidocaine with epinephrine. Under intermittent CT guidance, a 17 gauge coaxial needle was advanced into the peripheral aspect of the right-sided retroperitoneal nodule (image 16, series 6). Appropriate positioning was confirmed and 6 core needle biopsy samples were obtained with an 18 gauge core needle biopsy device. The co-axial needle was removed following the administration of a Gel-Foam slurry and hemostasis was achieved with manual compression. A limited postprocedural CT was negative for hemorrhage or additional complication. A dressing was placed. The patient tolerated  the procedure well without immediate postprocedural complication. IMPRESSION: Technically successful CT guided core needle biopsy of dominant indeterminate right-sided retroperitoneal nodule. Electronically Signed   By: Simonne Come M.D.   On: 07/27/2023 11:54     ASSESSMENT AND PLAN:  This is a very pleasant 77 years old white female with stage IV non-small cell lung cancer, adenocarcinoma with positive EGFR mutation with deletion in exon 19 and currently undergoing treatment with Gilotrif initially at a dose of 40 mg for 9 months followed by 30 mg by mouth daily status post 82 months.  She also underwent SBRT to the enlarging left upper lobe lung nodule. Her PET scan showed hypermetabolic subcutaneous nodule anterior to the left shoulder musculature concerning for cutaneous metastasis in addition to the enlarging nodule in the retroperitoneum anterior to the left iliacus muscle that also hypermetabolic and concerning for metastatic deposit with enlarging hypermetabolic nodule and consolidation peripherally in the left upper lobe concerning for lung cancer recurrence. She had surgical resection of the soft tissue lesion in the retroperitoneum anterior to the left iliacus muscle and this was consistent with follicular lymphoma. The patient has been tolerating her treatment with GILOTRIF fairly  well with no concerning adverse effects except for occasional nausea and few episodes of diarrhea. Her scan in September 2024 showed no concerning findings for disease progression except for enlargement of her retroperitoneal nodule posterior to the right kidney currently measuring 2.1 x 1.7 cm. 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    Stage 4 Non-Small Cell Lung Cancer (Adenocarcinoma with positive EGFR mutation with exon 19 deletion) Stable on Gilotrif 30mg  daily since July 2016. No significant side effects except for intermittent diarrhea and runny nose. No new respiratory symptoms or weight loss. -Continue Gilotrif 30mg  daily. -Monitor for side effects and manage symptomatically.  Follicular Lymphoma New diagnosis based on biopsy of retroperitoneal nodule. Currently asymptomatic and not causing any organ compression or systemic symptoms. -Plan to monitor without treatment at this time. -Explain to patient that treatment would be considered if lymphoma becomes symptomatic or causes organ compression.  Follow-up in 2 months with repeat lab work. No imaging planned at next visit.   She was advised to call immediately if she has any concerning symptoms in the interval. The patient voices understanding of current disease status and treatment options and is in agreement with the current care plan. All questions were answered. The patient knows to call the clinic with any problems, questions or concerns. We can certainly see the patient much sooner if necessary. The total time spent in the appointment was 30 minutes.  Disclaimer: This note was dictated with voice recognition software. Similar sounding words can inadvertently be transcribed and may not be corrected upon review.

## 2023-08-31 ENCOUNTER — Other Ambulatory Visit: Payer: Self-pay | Admitting: Nurse Practitioner

## 2023-08-31 DIAGNOSIS — I10 Essential (primary) hypertension: Secondary | ICD-10-CM

## 2023-09-03 ENCOUNTER — Other Ambulatory Visit: Payer: Self-pay

## 2023-09-03 NOTE — Progress Notes (Signed)
Specialty Pharmacy Ongoing Clinical Assessment Note  Elizabeth [name] is a 77 y.o. female who is being followed by the specialty pharmacy service for [facility] Oncology   Patient's specialty medication(s) reviewed today: Afatinib Dimaleate   Missed doses in the last 4 weeks: 0   Patient/Caregiver did not have any additional questions or concerns.   Therapeutic benefit summary: Patient is achieving benefit   Adverse events/side effects summary: No adverse events/side effects   Patient's therapy is appropriate to: Continue    Goals Addressed             This Visit's Progress    Slow Disease Progression       Patient is on track. Patient will maintain adherence. Per provider note from 08/09/23, September 2024 scans did not show disease progression aside from enlargement of nodule to right kidney which was determined to be consistent with B-cell lymphoma.          Follow up:  6 months  Otto Herb Specialty Pharmacist

## 2023-09-03 NOTE — Progress Notes (Signed)
Specialty Pharmacy Refill Coordination Note  Elizabeth [name] is a 77 y.o. female contacted today regarding refills of specialty medication(s) Afatinib Dimaleate   Patient requested Delivery   Delivery date: 09/10/23   Verified address: [address]  [place]    Medication will be filled on 09/07/23.

## 2023-09-07 ENCOUNTER — Other Ambulatory Visit: Payer: Self-pay

## 2023-09-25 ENCOUNTER — Other Ambulatory Visit: Payer: Self-pay

## 2023-09-25 ENCOUNTER — Other Ambulatory Visit: Payer: Self-pay | Admitting: Internal Medicine

## 2023-09-27 ENCOUNTER — Other Ambulatory Visit: Payer: Self-pay | Admitting: Internal Medicine

## 2023-09-27 ENCOUNTER — Other Ambulatory Visit: Payer: Self-pay

## 2023-09-27 MED ORDER — AFATINIB DIMALEATE 30 MG PO TABS
ORAL_TABLET | ORAL | 2 refills | Status: DC
  Filled 2023-09-28: qty 30, 30d supply, fill #0
  Filled 2023-11-01: qty 30, 30d supply, fill #1
  Filled 2023-12-06: qty 30, 30d supply, fill #2

## 2023-09-27 NOTE — Progress Notes (Signed)
Specialty Pharmacy Refill Coordination Note  Elizabeth [name] is a 77 y.o. female contacted today regarding refills of specialty medication(s) Afatinib Dimaleate   Patient requested Delivery   Delivery date: 10/11/23   Verified address: [address]   Medication will be filled on 10/10/23. Pending Refill Request.

## 2023-09-28 ENCOUNTER — Other Ambulatory Visit: Payer: Self-pay

## 2023-10-01 ENCOUNTER — Other Ambulatory Visit: Payer: Self-pay | Admitting: Nurse Practitioner

## 2023-10-01 DIAGNOSIS — I10 Essential (primary) hypertension: Secondary | ICD-10-CM

## 2023-10-10 ENCOUNTER — Other Ambulatory Visit: Payer: Self-pay

## 2023-10-11 ENCOUNTER — Inpatient Hospital Stay: Payer: Medicare Other

## 2023-10-11 ENCOUNTER — Inpatient Hospital Stay: Payer: Medicare Other | Attending: Internal Medicine | Admitting: Internal Medicine

## 2023-10-11 ENCOUNTER — Other Ambulatory Visit (HOSPITAL_COMMUNITY): Payer: Self-pay

## 2023-10-11 ENCOUNTER — Ambulatory Visit: Payer: Medicare Other | Admitting: Internal Medicine

## 2023-10-11 ENCOUNTER — Other Ambulatory Visit: Payer: Medicare Other

## 2023-10-11 VITALS — BP 136/77 | HR 98 | Temp 97.9°F | Resp 16 | Ht 60.0 in | Wt 165.8 lb

## 2023-10-11 DIAGNOSIS — C3490 Malignant neoplasm of unspecified part of unspecified bronchus or lung: Secondary | ICD-10-CM | POA: Diagnosis not present

## 2023-10-11 DIAGNOSIS — F419 Anxiety disorder, unspecified: Secondary | ICD-10-CM | POA: Diagnosis not present

## 2023-10-11 DIAGNOSIS — E039 Hypothyroidism, unspecified: Secondary | ICD-10-CM | POA: Diagnosis not present

## 2023-10-11 DIAGNOSIS — Z880 Allergy status to penicillin: Secondary | ICD-10-CM | POA: Diagnosis not present

## 2023-10-11 DIAGNOSIS — D649 Anemia, unspecified: Secondary | ICD-10-CM | POA: Insufficient documentation

## 2023-10-11 DIAGNOSIS — I10 Essential (primary) hypertension: Secondary | ICD-10-CM | POA: Diagnosis not present

## 2023-10-11 DIAGNOSIS — R1310 Dysphagia, unspecified: Secondary | ICD-10-CM | POA: Insufficient documentation

## 2023-10-11 DIAGNOSIS — Z923 Personal history of irradiation: Secondary | ICD-10-CM | POA: Insufficient documentation

## 2023-10-11 DIAGNOSIS — Z90710 Acquired absence of both cervix and uterus: Secondary | ICD-10-CM | POA: Diagnosis not present

## 2023-10-11 DIAGNOSIS — E785 Hyperlipidemia, unspecified: Secondary | ICD-10-CM | POA: Insufficient documentation

## 2023-10-11 DIAGNOSIS — C3432 Malignant neoplasm of lower lobe, left bronchus or lung: Secondary | ICD-10-CM | POA: Insufficient documentation

## 2023-10-11 DIAGNOSIS — Z79899 Other long term (current) drug therapy: Secondary | ICD-10-CM | POA: Diagnosis not present

## 2023-10-11 DIAGNOSIS — Z90722 Acquired absence of ovaries, bilateral: Secondary | ICD-10-CM | POA: Insufficient documentation

## 2023-10-11 DIAGNOSIS — C3492 Malignant neoplasm of unspecified part of left bronchus or lung: Secondary | ICD-10-CM

## 2023-10-11 LAB — CBC WITH DIFFERENTIAL (CANCER CENTER ONLY)
Abs Immature Granulocytes: 0.04 10*3/uL (ref 0.00–0.07)
Basophils Absolute: 0 10*3/uL (ref 0.0–0.1)
Basophils Relative: 0 %
Eosinophils Absolute: 0.2 10*3/uL (ref 0.0–0.5)
Eosinophils Relative: 3 %
HCT: 37 % (ref 36.0–46.0)
Hemoglobin: 12.4 g/dL (ref 12.0–15.0)
Immature Granulocytes: 1 %
Lymphocytes Relative: 15 %
Lymphs Abs: 1.2 10*3/uL (ref 0.7–4.0)
MCH: 27.2 pg (ref 26.0–34.0)
MCHC: 33.5 g/dL (ref 30.0–36.0)
MCV: 81.1 fL (ref 80.0–100.0)
Monocytes Absolute: 0.8 10*3/uL (ref 0.1–1.0)
Monocytes Relative: 10 %
Neutro Abs: 5.5 10*3/uL (ref 1.7–7.7)
Neutrophils Relative %: 71 %
Platelet Count: 265 10*3/uL (ref 150–400)
RBC: 4.56 MIL/uL (ref 3.87–5.11)
RDW: 13.3 % (ref 11.5–15.5)
WBC Count: 7.7 10*3/uL (ref 4.0–10.5)
nRBC: 0 % (ref 0.0–0.2)

## 2023-10-11 LAB — CMP (CANCER CENTER ONLY)
ALT: 20 U/L (ref 0–44)
AST: 23 U/L (ref 15–41)
Albumin: 3.9 g/dL (ref 3.5–5.0)
Alkaline Phosphatase: 72 U/L (ref 38–126)
Anion gap: 6 (ref 5–15)
BUN: 18 mg/dL (ref 8–23)
CO2: 30 mmol/L (ref 22–32)
Calcium: 8.7 mg/dL — ABNORMAL LOW (ref 8.9–10.3)
Chloride: 99 mmol/L (ref 98–111)
Creatinine: 1.06 mg/dL — ABNORMAL HIGH (ref 0.44–1.00)
GFR, Estimated: 54 mL/min — ABNORMAL LOW (ref >60)
Glucose, Bld: 91 mg/dL (ref 70–99)
Potassium: 3.8 mmol/L (ref 3.5–5.1)
Sodium: 135 mmol/L (ref 135–145)
Total Bilirubin: 0.9 mg/dL (ref <1.2)
Total Protein: 6.6 g/dL (ref 6.5–8.1)

## 2023-10-11 LAB — LACTATE DEHYDROGENASE: LDH: 179 U/L (ref 98–192)

## 2023-10-11 MED ORDER — HYDROCODONE BIT-HOMATROP MBR 5-1.5 MG/5ML PO SOLN
5.0000 mL | Freq: Four times a day (QID) | ORAL | 0 refills | Status: DC | PRN
  Filled 2023-10-11 (×2): qty 120, 6d supply, fill #0

## 2023-10-11 NOTE — Progress Notes (Signed)
[facility] [phone]   [phone]  OFFICE PROGRESS NOTE  Lucky Cowboy, MD [address] [place] [address]  DIAGNOSIS: Stage IV (T2a, N0, M1a) non-small cell lung cancer, adenocarcinoma with positive EGFR mutation with deletion in exon 19 diagnosed in June 2016 and presented with large mass in the left lower lobe in addition to multiple bilateral pulmonary nodules  PRIOR THERAPY:  1) Gilotrif 40 mg by mouth daily started 05/11/2015, status post 9 months of treatment discontinued on 02/17/2016 secondary to extensive skin rash. 2) SBRT to the enlarging left upper lobe nodule under the care of Dr. Mitzi Hansen.  CURRENT THERAPY: Gilotrif 30 mg by mouth daily started 02/21/2016 status post 91 months of treatment.  INTERVAL HISTORY: Elizabeth [name] 77 y.o. female returns to the clinic today for follow-up visit.Discussed the use of AI scribe software for clinical note transcription with the patient, who gave verbal consent to proceed.  History of Present Illness   Elizabeth [name], a 76 year old patient with a history of stage 4 small cell lung cancer, has been on Erlotinib (Gilotrif) treatment for a total of 100 months. The patient initially started on a 40mg  dose for nine months, which was then reduced to 30mg  for the subsequent 91 months.  Recently, the patient has been experiencing increased difficulty with swallowing and breathing, which she describes as a sensation of choking. This has been accompanied by a cough that varies in severity. The patient has been self-medicating with Delsym, an over-the-counter cough suppressant, which provides temporary relief. She has also been taking Nexium, an acid reflux medication, when symptoms become severe.  The patient has requested a prescription for codeine cough medicine, which she believes helps to relax the spasms and improve her breathing. She has not consulted with a gastroenterologist  regarding these symptoms. The patient acknowledges the potential for constipation with codeine use.  The patient's recent blood work showed improvement in her anemia, with a hemoglobin level now over 12. However, there were changes noted on her last scan, the specifics of which were not discussed in the conversation.       MEDICAL HISTORY: Past Medical History:  Diagnosis Date   Adenocarcinoma of left lung, stage 4 (HCC) 04/05/2015   Biopsy confirmed CT SCAN: There are innumerable bilateral pulmonary nodules. These range in size from about 5 mm to 2 cm. They demonstrate irregular indistinct borders, the larger ones demonstrating spiculation. PET SCAN: Diffuse pulmonary metastatic disease with a 4 cm dominant left lower low lung lesion. No enlarged or hypermetabolic mediastinal or hilar adenopathy.   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  MEDICATIONS:  Current Outpatient Medications  Medication Sig Dispense Refill   afatinib dimaleate (GILOTRIF) 30 MG tablet TAKE 1 TABLET (30MG ) BY MOUTH ONCE DAILY. TAKE ON AN EMPTY STOMACH 1 HOUR BEFORE OR 2 HOURS AFTER A MEAL 30 tablet 2   ALPRAZolam (XANAX) 1 MG tablet  Take 1 mg by mouth at bedtime.     aspirin 81 MG tablet Take 81 mg by mouth at bedtime.      CELEBREX 200 MG capsule Take 200 mg by mouth daily as needed.     ezetimibe (ZETIA) 10 MG tablet Take 1 tablet (10 mg total) by mouth daily. 90 tablet 3   hydrochlorothiazide (HYDRODIURIL) 25 MG tablet TAKE 1 TABLET(25 MG) BY MOUTH  DAILY 30 tablet 3   ibuprofen (ADVIL,MOTRIN) 200 MG tablet Take 400 mg by mouth every 6 (six) hours as needed for headache or moderate pain.     levothyroxine (SYNTHROID) 50 MCG tablet Take  1 tablet  Daily  on an empty stomach with only water for 30 minutes & no Antacid meds, Calcium or Magnesium for 4 hours & avoid Biotin                                             /                       TAKE                         BY            MOUTH 90 tablet 3   losartan (COZAAR) 50 MG tablet TAKE 1 TABLET(50 MG) BY MOUTH DAILY 30 tablet 11   montelukast (SINGULAIR) 10 MG tablet TAKE 1 TABLET BY MOUTH FOR ALLERGIES 90 tablet 3   No current facility-administered medications for this visit.    SURGICAL HISTORY:  Past Surgical History:  Procedure Laterality Date   ABDOMINAL HYSTERECTOMY  10/23/1993   w BSO   CYST REMOVAL HAND Right    EXCISION MASS ABDOMINAL N/A 08/11/2021   Procedure: EXCISIONAL BIOPSY OF LEFT RETROPERITONEAL MASS, POSSIBLE INTRAOPERATIVE LAPAROSCOPIC ULTRASOUND;  Surgeon: Karie Soda, MD;  Location: [facility];  Service: General;  Laterality: N/A;   LAPAROSCOPY N/A 08/11/2021   Procedure: LAPAROSCOPIC EXPLORATION;  Surgeon: Karie Soda, MD;  Location: [facility];  Service: General;  Laterality: N/A;   TONSILLECTOMY AND ADENOIDECTOMY      REVIEW OF SYSTEMS:  A comprehensive review of systems was negative except for: Respiratory: positive for cough Gastrointestinal: positive for dyspepsia   PHYSICAL EXAMINATION: General appearance: alert, cooperative, and no distress Head: Normocephalic, without obvious abnormality, atraumatic Neck: no adenopathy, no JVD, supple, symmetrical, trachea midline, and thyroid not enlarged, symmetric, no tenderness/mass/nodules Lymph nodes: Cervical, supraclavicular, and axillary nodes normal. Resp: clear to auscultation bilaterally Back: symmetric, no curvature. ROM normal. No CVA tenderness. Cardio: regular rate and rhythm, S1, S2 normal, no murmur, click,  rub or gallop GI: soft, non-tender; bowel sounds normal; no masses,  no organomegaly Extremities: extremities normal, atraumatic, no cyanosis or edema  ECOG PERFORMANCE STATUS: 1 - Symptomatic but completely ambulatory  Blood pressure 136/77, pulse 98, temperature 97.9 F (36.6 C), resp. rate 16, height 5' (1.524 m), weight 165 lb 12.8 oz (75.2 kg), SpO2 99%.  LABORATORY DATA: Lab Results  Component Value Date   WBC 7.7 10/11/2023   HGB 12.4 10/11/2023   HCT 37.0 10/11/2023   MCV 81.1 10/11/2023   PLT 265 10/11/2023      Chemistry      Component Value Date/Time   NA 135 10/11/2023 1207   NA 139 10/26/2017 1252  K 3.8 10/11/2023 1207   K 4.1 10/26/2017 1252   CL 99 10/11/2023 1207   CO2 30 10/11/2023 1207   CO2 28 10/26/2017 1252   BUN 18 10/11/2023 1207   BUN 15.7 10/26/2017 1252   CREATININE 1.06 (H) 10/11/2023 1207   CREATININE 0.94 05/22/2023 1158   CREATININE 1.0 10/26/2017 1252      Component Value Date/Time   CALCIUM 8.7 (L) 10/11/2023 1207   CALCIUM 8.8 10/26/2017 1252   ALKPHOS 72 10/11/2023 1207   ALKPHOS 70 10/26/2017 1252   AST 23 10/11/2023 1207   AST 19 10/26/2017 1252   ALT 20 10/11/2023 1207   ALT 22 10/26/2017 1252   BILITOT 0.9 10/11/2023 1207   BILITOT 0.90 10/26/2017 1252       RADIOGRAPHIC STUDIES: No results found.   ASSESSMENT AND PLAN:  This is a very pleasant 77 years old white female with stage IV non-small cell lung cancer, adenocarcinoma with positive EGFR mutation with deletion in exon 19 and currently undergoing treatment with Gilotrif initially at a dose of 40 mg for 9 months followed by 30 mg by mouth daily status post 91  months.  She also underwent SBRT to the enlarging left upper lobe lung nodule. Her PET scan showed hypermetabolic subcutaneous nodule anterior to the left shoulder musculature concerning for cutaneous metastasis in addition to the enlarging nodule in the retroperitoneum anterior to the left iliacus muscle that  also hypermetabolic and concerning for metastatic deposit with enlarging hypermetabolic nodule and consolidation peripherally in the left upper lobe concerning for lung cancer recurrence. She had surgical resection of the soft tissue lesion in the retroperitoneum anterior to the left iliacus muscle and this was consistent with follicular lymphoma. Her scan in September 2024 showed no concerning findings for disease progression except for enlargement of her retroperitoneal nodule posterior to the right kidney currently measuring 2.1 x 1.7 cm. 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 She has been tolerating her treatment with GILOTRIF fairly well.  Non-Small Cell Lung Cancer (NSCLC) with EGFR Exon 19 Mutation NSCLC with EGFR Exon 19 mutation, diagnosed in 2016. On afatinib (Gilotrif) for 100 months, initially at 40 mg for 9 months, then 30 mg for 91 months. Currently experiencing dysphagia and dyspnea, possibly due to disease progression or treatment side effects. Blood work shows improved anemia with hemoglobin over 12. Discussed codeine cough medicine for dyspnea and spasms, with caution about constipation and dependency. - Prescribe Hycodan cough medicine - Order follow-up scan in two months - Schedule scan 10 days before next appointment  Gastroesophageal Reflux Disease (GERD) Uses Nexium (esomeprazole) for acid reflux, which alleviates symptoms. GERD may contribute to dysphagia and dyspnea. Discussed the importance of managing GERD. - Continue using Nexium as needed - Consider referral to gastroenterologist if symptoms persist  Anemia Anemia improved with hemoglobin levels over 12. No further action required at this time. - Monitor hemoglobin levels in future blood work  Follow-up - Schedule follow-up appointment in two months - Order scan 10 days before  next appointment.   She was advised to call immediately if she has any concerning symptoms in the interval. The patient voices understanding of current disease status and treatment options and is in agreement with the current care plan. All questions were answered. The patient knows to call the clinic with any problems, questions or concerns. We can certainly see the patient much  sooner if necessary. The total time spent in the appointment was 20 minutes.  Disclaimer: This note was dictated with voice recognition software. Similar sounding words can inadvertently be transcribed and may not be corrected upon review.

## 2023-10-21 NOTE — Progress Notes (Unsigned)
Assessment and Plan:  Rushelle was seen today for urinary tract infection.  Diagnoses and all orders for this visit:  Adenocarcinoma of left lung, stage 4 (HCC) Continue to follow with oncology Continue Gilotrif  Essential hypertension - continue medications- Losartan 50 mg every day ,hydrochlorothiazide 25 mg every day  DASH diet, exercise and monitor at home. Call if greater than 130/80.  - CBC - CMP  Aortic atherosclerosis (HCC) by Abd CT scan on 01/26/2-021 Control BP. Weight, blood glucose and cholesterol  Hyperlipidemia, mixed Continue Zetia 10 mg every day . Low saturated fat diet and exercise as tolerated -     Lipid panel  Abnormal glucose Continue diet, and exercise as tolerated  Hypothyroidism, unspecified type Please take your thyroid medication greater than 30 min before breakfast, separated by at least 4 hours  from antacids, calcium, iron, and multivitamins.  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 Discussed med's effects and SE's.   Over 30 minutes of exam, counseling, chart review, and critical decision making was performed.   Future Appointments  Date Time Provider Department Center  01/21/2024 11:00 AM Lucky Cowboy, MD [facility] None  04/24/2024 11:00 AM Raynelle Dick, NP [facility] None    ------------------------------------------------------------------------------------------------------------------   HPI BP 124/76   Pulse 88   Temp (!) 97.3 F (36.3 C)   Ht 5' (1.524 m)   Wt 168 lb 3.2 oz (76.3 kg)   SpO2 99%   BMI 32.85 kg/m   77 y.o.female presents for 3 month follow up. has Abnormal  glucose; Anxiety; Insomnia; Vitamin D deficiency; Hypothyroidism; Essential hypertension; Hyperlipidemia, mixed; Medication management; Adenocarcinoma of left lung, stage 4 (HCC); Encounter for antineoplastic chemotherapy; Aortic atherosclerosis (HCC) by Abd CT scan on 01/26/2-021; Obesity (BMI 30.0-34.9); Osteopenia; Chest pain; Metastatic lung cancer (metastasis from lung to other site) [facility]); Multiple closed fractures of ribs of left side; and B-cell non-Hodgkin lymphoma (HCC) on their problem list.   She is getting leg cramps at night between 3-4 o'clock at night- relieves with use of pickle juice.  She has been having issues with GERD symptoms and is taking Nexium with good results.   She is getting gel shots in her knees again those wont start until end of January- and also had hydrocortisone shots in both knees 2 weeks ago  She is possibly going to start PT.   BP well controlled with hydrochlorothiazide 25 mg every day and Losartan 50 mg every day. BP Readings from Last 3 Encounters:  10/22/23 124/76  10/11/23 136/77  08/09/23 136/80  Denies headaches, chest pain, shortness of breath and dizziness   BMI is Body mass index is 32.85 kg/m., she has not been working on diet and exercise. Wt Readings from Last 3 Encounters:  10/22/23 168 lb 3.2 oz (76.3 kg)  10/11/23 165 lb 12.8 oz (75.2 kg)  08/09/23 168 lb (76.2 kg)    She has history of stage 4 lung cancer, on Gilotrif maintenance therapy and following with Dr.  Mohamed. Recent PET scan showed recurrent tumor with concern for mets. She had biopsy of nodule and referred to Dr. Roselind Messier for consideration of SBRT to these 3 suspicious nodules and she will continue her current treatment with Gilotrif. 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 Shirline Frees, has follow up 05/07/23. If further disease progression, Dr. Arbutus Ped would recommend repeat PET scan and probably rebiopsy to rule out any resistant  mutation. Next CT  06/2023 follicular lymphoma that is just being followed  She has not been drinking much water, has noticed some cloudy urine.  Denies burning, frequency.  Lab Results  Component Value Date   EGFR 63 05/22/2023     Past Medical History:  Diagnosis Date   Adenocarcinoma of left lung, stage 4 (HCC) 04/05/2015   Biopsy confirmed CT SCAN: There are innumerable bilateral pulmonary nodules. These range in size from about 5 mm to 2 cm. They demonstrate irregular indistinct borders, the larger ones demonstrating spiculation. PET SCAN: Diffuse pulmonary metastatic disease with a 4 cm dominant left lower low lung lesion. No enlarged or hypermetabolic mediastinal or hilar adenopathy.   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   Cancer (HCC)    Change of skin related to chemotherapy 09/25/2017   Drug-induced skin rash 01/17/2016   History of radiation therapy 12/18/2019   SBRT left lung  12/11/2019-12/18/2019   Dr Antony Blackbird   History of radiation therapy 06/21/2021   left lung 06/14/2021-06/21/2021 Dr Antony Blackbird   Hyperlipidemia    Hypertension    Hypothyroidism    Insomnia    Prediabetes    Thyroid disease      Allergies  Allergen Reactions   Penicillins Swelling and Rash    Current Outpatient Medications on File Prior to Visit  Medication Sig   afatinib dimaleate (GILOTRIF) 30 MG tablet TAKE 1 TABLET (30MG ) BY MOUTH ONCE DAILY. TAKE ON AN EMPTY STOMACH 1 HOUR BEFORE OR 2 HOURS AFTER A MEAL   ALPRAZolam (XANAX) 1 MG tablet Take 1 mg by mouth at bedtime.   aspirin 81 MG tablet Take 81 mg by mouth at bedtime.    CELEBREX 200 MG capsule Take 200 mg by mouth daily as needed.   ezetimibe (ZETIA) 10 MG tablet Take 1 tablet (10 mg total) by mouth daily.   hydrochlorothiazide (HYDRODIURIL) 25 MG tablet TAKE 1 TABLET(25 MG) BY MOUTH DAILY   HYDROcodone bit-homatropine (HYCODAN) 5-1.5 MG/5ML syrup Take 5 mLs by mouth every 6 (six) hours as needed for  cough.   ibuprofen (ADVIL,MOTRIN) 200 MG tablet Take 400 mg by mouth every 6 (six) hours as needed for headache or moderate pain.   levothyroxine (SYNTHROID) 50 MCG tablet Take  1 tablet  Daily  on an empty stomach with only water for 30 minutes & no Antacid meds, Calcium or Magnesium for 4 hours & avoid Biotin                                             /                       TAKE                         BY  MOUTH   losartan (COZAAR) 50 MG tablet TAKE 1 TABLET(50 MG) BY MOUTH DAILY   montelukast (SINGULAIR) 10 MG tablet TAKE 1 TABLET BY MOUTH FOR ALLERGIES   No current facility-administered medications on file prior to visit.    ROS: all negative except above.   Physical Exam:  BP 124/76   Pulse 88   Temp (!) 97.3 F (36.3 C)   Ht 5' (1.524 m)   Wt 168 lb 3.2 oz (76.3 kg)   SpO2 99%   BMI 32.85 kg/m   General Appearance: Well nourished, in no apparent distress. Eyes: PERRLA, EOMs, conjunctiva no swelling or erythema Sinuses: No Frontal/maxillary tenderness ENT/Mouth: Ext aud canals clear, TMs without erythema, bulging. No erythema, swelling, or exudate on post pharynx.  Tonsils not swollen or erythematous. Hearing normal.  Neck: Supple, thyroid normal.  Respiratory: Respiratory effort normal, BS equal bilaterally without rales, rhonchi, wheezing or stridor.  Cardio: RRR with no MRGs. Brisk peripheral pulses without edema.  Abdomen: Soft, + BS.  Non tender, no guarding, rebound, hernias, masses. Lymphatics: Non tender without lymphadenopathy.  Musculoskeletal: Full ROM, 5/5 strength, normal gait.  Skin: Warm, dry . Red raised border rash of lower abdomen Neuro: Cranial nerves intact. Normal muscle tone, no cerebellar symptoms. Sensation intact.  Psych: Awake and oriented X 3, normal affect, Insight and Judgment appropriate.     Raynelle Dick, NP 11:47 AM [facility]

## 2023-10-22 ENCOUNTER — Encounter: Payer: Self-pay | Admitting: Nurse Practitioner

## 2023-10-22 ENCOUNTER — Ambulatory Visit (INDEPENDENT_AMBULATORY_CARE_PROVIDER_SITE_OTHER): Payer: Medicare Other | Admitting: Nurse Practitioner

## 2023-10-22 VITALS — BP 124/76 | HR 88 | Temp 97.3°F | Ht 60.0 in | Wt 168.2 lb

## 2023-10-22 DIAGNOSIS — E039 Hypothyroidism, unspecified: Secondary | ICD-10-CM

## 2023-10-22 DIAGNOSIS — E782 Mixed hyperlipidemia: Secondary | ICD-10-CM

## 2023-10-22 DIAGNOSIS — R7309 Other abnormal glucose: Secondary | ICD-10-CM

## 2023-10-22 DIAGNOSIS — N1831 Chronic kidney disease, stage 3a: Secondary | ICD-10-CM

## 2023-10-22 DIAGNOSIS — C3492 Malignant neoplasm of unspecified part of left bronchus or lung: Secondary | ICD-10-CM

## 2023-10-22 DIAGNOSIS — I700 Atherosclerosis of aorta: Secondary | ICD-10-CM

## 2023-10-22 DIAGNOSIS — I10 Essential (primary) hypertension: Secondary | ICD-10-CM | POA: Diagnosis not present

## 2023-10-22 DIAGNOSIS — Z79899 Other long term (current) drug therapy: Secondary | ICD-10-CM

## 2023-10-22 DIAGNOSIS — E559 Vitamin D deficiency, unspecified: Secondary | ICD-10-CM

## 2023-10-22 NOTE — Patient Instructions (Signed)

## 2023-10-23 LAB — COMPLETE METABOLIC PANEL WITH GFR
AG Ratio: 1.3 (calc) (ref 1.0–2.5)
ALT: 12 U/L (ref 6–29)
AST: 16 U/L (ref 10–35)
Albumin: 3.5 g/dL — ABNORMAL LOW (ref 3.6–5.1)
Alkaline phosphatase (APISO): 68 U/L (ref 37–153)
BUN/Creatinine Ratio: 18 (calc) (ref 6–22)
BUN: 19 mg/dL (ref 7–25)
CO2: 30 mmol/L (ref 20–32)
Calcium: 8.9 mg/dL (ref 8.6–10.4)
Chloride: 102 mmol/L (ref 98–110)
Creat: 1.04 mg/dL — ABNORMAL HIGH (ref 0.60–1.00)
Globulin: 2.7 g/dL (ref 1.9–3.7)
Glucose, Bld: 81 mg/dL (ref 65–99)
Potassium: 4.2 mmol/L (ref 3.5–5.3)
Sodium: 139 mmol/L (ref 135–146)
Total Bilirubin: 0.8 mg/dL (ref 0.2–1.2)
Total Protein: 6.2 g/dL (ref 6.1–8.1)
eGFR: 56 mL/min/{1.73_m2} — ABNORMAL LOW (ref >=60)

## 2023-10-23 LAB — LIPID PANEL
Cholesterol: 162 mg/dL (ref <200)
HDL: 48 mg/dL — ABNORMAL LOW (ref >=50)
LDL Cholesterol (Calc): 94 mg/dL
Non-HDL Cholesterol (Calc): 114 mg/dL (ref <130)
Total CHOL/HDL Ratio: 3.4 (calc) (ref <5.0)
Triglycerides: 100 mg/dL (ref <150)

## 2023-10-23 LAB — CBC WITH DIFFERENTIAL/PLATELET
Absolute Lymphocytes: 1132 {cells}/uL (ref 850–3900)
Absolute Monocytes: 642 {cells}/uL (ref 200–950)
Basophils Absolute: 37 {cells}/uL (ref 0–200)
Basophils Relative: 0.5 %
Eosinophils Absolute: 219 {cells}/uL (ref 15–500)
Eosinophils Relative: 3 %
HCT: 35.2 % (ref 35.0–45.0)
Hemoglobin: 11.2 g/dL — ABNORMAL LOW (ref 11.7–15.5)
MCH: 26.9 pg — ABNORMAL LOW (ref 27.0–33.0)
MCHC: 31.8 g/dL — ABNORMAL LOW (ref 32.0–36.0)
MCV: 84.4 fL (ref 80.0–100.0)
MPV: 11.3 fL (ref 7.5–12.5)
Monocytes Relative: 8.8 %
Neutro Abs: 5271 {cells}/uL (ref 1500–7800)
Neutrophils Relative %: 72.2 %
Platelets: 230 10*3/uL (ref 140–400)
RBC: 4.17 10*6/uL (ref 3.80–5.10)
RDW: 13.2 % (ref 11.0–15.0)
Total Lymphocyte: 15.5 %
WBC: 7.3 10*3/uL (ref 3.8–10.8)

## 2023-10-23 LAB — MAGNESIUM: Magnesium: 1.9 mg/dL (ref 1.5–2.5)

## 2023-10-23 LAB — TSH: TSH: 2.28 m[IU]/L (ref 0.40–4.50)

## 2023-11-01 ENCOUNTER — Other Ambulatory Visit: Payer: Self-pay

## 2023-11-01 ENCOUNTER — Other Ambulatory Visit (HOSPITAL_COMMUNITY): Payer: Self-pay

## 2023-11-01 NOTE — Progress Notes (Signed)
 Specialty Pharmacy Refill Coordination Note  Elizabeth [name] is a 78 y.o. female contacted today regarding refills of specialty medication(s) Afatinib  Dimaleate (GILOTRIF )   Patient requested Delivery   Delivery date: 11/09/23   Verified address: [address]   Medication will be filled on 11/08/23.

## 2023-11-08 ENCOUNTER — Other Ambulatory Visit: Payer: Self-pay

## 2023-11-16 ENCOUNTER — Other Ambulatory Visit: Payer: Self-pay | Admitting: Internal Medicine

## 2023-11-16 DIAGNOSIS — E039 Hypothyroidism, unspecified: Secondary | ICD-10-CM

## 2023-12-03 ENCOUNTER — Ambulatory Visit (HOSPITAL_COMMUNITY)
Admission: RE | Admit: 2023-12-03 | Discharge: 2023-12-03 | Disposition: A | Discharge status: Home / Self Care | Payer: Medicare Other | Source: Ambulatory Visit | Attending: Internal Medicine | Admitting: Internal Medicine

## 2023-12-03 ENCOUNTER — Inpatient Hospital Stay: Payer: Medicare Other | Attending: Internal Medicine

## 2023-12-03 DIAGNOSIS — D1803 Hemangioma of intra-abdominal structures: Secondary | ICD-10-CM | POA: Insufficient documentation

## 2023-12-03 DIAGNOSIS — E039 Hypothyroidism, unspecified: Secondary | ICD-10-CM | POA: Insufficient documentation

## 2023-12-03 DIAGNOSIS — Z880 Allergy status to penicillin: Secondary | ICD-10-CM | POA: Diagnosis not present

## 2023-12-03 DIAGNOSIS — K449 Diaphragmatic hernia without obstruction or gangrene: Secondary | ICD-10-CM | POA: Diagnosis not present

## 2023-12-03 DIAGNOSIS — I10 Essential (primary) hypertension: Secondary | ICD-10-CM | POA: Insufficient documentation

## 2023-12-03 DIAGNOSIS — E785 Hyperlipidemia, unspecified: Secondary | ICD-10-CM | POA: Diagnosis not present

## 2023-12-03 DIAGNOSIS — R059 Cough, unspecified: Secondary | ICD-10-CM | POA: Insufficient documentation

## 2023-12-03 DIAGNOSIS — Z79899 Other long term (current) drug therapy: Secondary | ICD-10-CM | POA: Diagnosis not present

## 2023-12-03 DIAGNOSIS — J984 Other disorders of lung: Secondary | ICD-10-CM | POA: Diagnosis not present

## 2023-12-03 DIAGNOSIS — M160 Bilateral primary osteoarthritis of hip: Secondary | ICD-10-CM | POA: Insufficient documentation

## 2023-12-03 DIAGNOSIS — C3432 Malignant neoplasm of lower lobe, left bronchus or lung: Secondary | ICD-10-CM | POA: Diagnosis present

## 2023-12-03 DIAGNOSIS — Z90722 Acquired absence of ovaries, bilateral: Secondary | ICD-10-CM | POA: Diagnosis not present

## 2023-12-03 DIAGNOSIS — R21 Rash and other nonspecific skin eruption: Secondary | ICD-10-CM | POA: Insufficient documentation

## 2023-12-03 DIAGNOSIS — R0609 Other forms of dyspnea: Secondary | ICD-10-CM | POA: Insufficient documentation

## 2023-12-03 DIAGNOSIS — M4317 Spondylolisthesis, lumbosacral region: Secondary | ICD-10-CM | POA: Diagnosis not present

## 2023-12-03 DIAGNOSIS — K5730 Diverticulosis of large intestine without perforation or abscess without bleeding: Secondary | ICD-10-CM | POA: Diagnosis not present

## 2023-12-03 DIAGNOSIS — C3490 Malignant neoplasm of unspecified part of unspecified bronchus or lung: Secondary | ICD-10-CM

## 2023-12-03 DIAGNOSIS — M4316 Spondylolisthesis, lumbar region: Secondary | ICD-10-CM | POA: Diagnosis not present

## 2023-12-03 DIAGNOSIS — R0602 Shortness of breath: Secondary | ICD-10-CM | POA: Diagnosis not present

## 2023-12-03 DIAGNOSIS — F419 Anxiety disorder, unspecified: Secondary | ICD-10-CM | POA: Diagnosis not present

## 2023-12-03 DIAGNOSIS — M51369 Other intervertebral disc degeneration, lumbar region without mention of lumbar back pain or lower extremity pain: Secondary | ICD-10-CM | POA: Insufficient documentation

## 2023-12-03 DIAGNOSIS — I700 Atherosclerosis of aorta: Secondary | ICD-10-CM | POA: Diagnosis not present

## 2023-12-03 DIAGNOSIS — Z79890 Hormone replacement therapy (postmenopausal): Secondary | ICD-10-CM | POA: Diagnosis not present

## 2023-12-03 DIAGNOSIS — Z90710 Acquired absence of both cervix and uterus: Secondary | ICD-10-CM | POA: Diagnosis not present

## 2023-12-03 LAB — CMP (CANCER CENTER ONLY)
ALT: 11 U/L (ref 0–44)
AST: 16 U/L (ref 15–41)
Albumin: 3.9 g/dL (ref 3.5–5.0)
Alkaline Phosphatase: 72 U/L (ref 38–126)
Anion gap: 5 (ref 5–15)
BUN: 20 mg/dL (ref 8–23)
CO2: 31 mmol/L (ref 22–32)
Calcium: 9.2 mg/dL (ref 8.9–10.3)
Chloride: 104 mmol/L (ref 98–111)
Creatinine: 0.9 mg/dL (ref 0.44–1.00)
GFR, Estimated: >60 mL/min (ref >60)
Glucose, Bld: 88 mg/dL (ref 70–99)
Potassium: 4.3 mmol/L (ref 3.5–5.1)
Sodium: 140 mmol/L (ref 135–145)
Total Bilirubin: 0.9 mg/dL (ref 0.0–1.2)
Total Protein: 6.9 g/dL (ref 6.5–8.1)

## 2023-12-03 LAB — CBC WITH DIFFERENTIAL (CANCER CENTER ONLY)
Abs Immature Granulocytes: 0.02 10*3/uL (ref 0.00–0.07)
Basophils Absolute: 0.1 10*3/uL (ref 0.0–0.1)
Basophils Relative: 1 %
Eosinophils Absolute: 0.2 10*3/uL (ref 0.0–0.5)
Eosinophils Relative: 3 %
HCT: 36.8 % (ref 36.0–46.0)
Hemoglobin: 11.6 g/dL — ABNORMAL LOW (ref 12.0–15.0)
Immature Granulocytes: 0 %
Lymphocytes Relative: 21 %
Lymphs Abs: 1.3 10*3/uL (ref 0.7–4.0)
MCH: 26.4 pg (ref 26.0–34.0)
MCHC: 31.5 g/dL (ref 30.0–36.0)
MCV: 83.6 fL (ref 80.0–100.0)
Monocytes Absolute: 0.6 10*3/uL (ref 0.1–1.0)
Monocytes Relative: 9 %
Neutro Abs: 4.1 10*3/uL (ref 1.7–7.7)
Neutrophils Relative %: 66 %
Platelet Count: 228 10*3/uL (ref 150–400)
RBC: 4.4 MIL/uL (ref 3.87–5.11)
RDW: 13.3 % (ref 11.5–15.5)
WBC Count: 6.2 10*3/uL (ref 4.0–10.5)
nRBC: 0 % (ref 0.0–0.2)

## 2023-12-03 MED ORDER — IOHEXOL 300 MG/ML  SOLN
100.0000 mL | Freq: Once | INTRAMUSCULAR | Status: AC | PRN
  Administered 2023-12-03: 100 mL via INTRAVENOUS

## 2023-12-05 ENCOUNTER — Other Ambulatory Visit (HOSPITAL_COMMUNITY): Payer: Self-pay

## 2023-12-06 ENCOUNTER — Other Ambulatory Visit: Payer: Self-pay

## 2023-12-06 NOTE — Progress Notes (Signed)
Specialty Pharmacy Refill Coordination Note  Elizabeth [name] is a 78 y.o. female contacted today regarding refills of specialty medication(s) Afatinib Dimaleate (GILOTRIF)   Patient requested Delivery   Delivery date: 12/11/23   Verified address: [address]   Medication will be filled on 2/17.

## 2023-12-10 ENCOUNTER — Inpatient Hospital Stay (HOSPITAL_BASED_OUTPATIENT_CLINIC_OR_DEPARTMENT_OTHER): Payer: Medicare Other | Admitting: Internal Medicine

## 2023-12-10 VITALS — BP 129/73 | HR 73 | Temp 97.8°F | Resp 18 | Ht 60.0 in | Wt 169.3 lb

## 2023-12-10 DIAGNOSIS — C3492 Malignant neoplasm of unspecified part of left bronchus or lung: Secondary | ICD-10-CM

## 2023-12-10 DIAGNOSIS — C3432 Malignant neoplasm of lower lobe, left bronchus or lung: Secondary | ICD-10-CM | POA: Diagnosis not present

## 2023-12-10 NOTE — Progress Notes (Signed)
 [facility] [phone]   [phone]  OFFICE PROGRESS NOTE  Lucky Cowboy, MD [address] [place] [address]  DIAGNOSIS: Stage IV (T2a, N0, M1a) non-small cell lung cancer, adenocarcinoma with positive EGFR mutation with deletion in exon 19 diagnosed in June 2016 and presented with large mass in the left lower lobe in addition to multiple bilateral pulmonary nodules  PRIOR THERAPY:  1) Gilotrif 40 mg by mouth daily started 05/11/2015, status post 9 months of treatment discontinued on 02/17/2016 secondary to extensive skin rash. 2) SBRT to the enlarging left upper lobe nodule under the care of Dr. Mitzi Hansen.  CURRENT THERAPY: Gilotrif 30 mg by mouth daily started 02/21/2016 status post 93 months of treatment.  INTERVAL HISTORY: Elizabeth [name] 78 y.o. female returns to the clinic today for follow-up visit. Discussed the use of AI scribe software for clinical note transcription with the patient, who gave verbal consent to proceed.  History of Present Illness   Elizabeth [name] is a 78 year old female with stage four non-small cell lung cancer who presents for routine follow-up.  She has a history of stage four non-small cell lung cancer diagnosed in June 2016, with a tumor positive for EGFR mutation with deletion in exon 19. She has been on Gilotrif for approximately eight years.  She has no new complaints since her last visit two months ago. She experiences shortness of breath on exertion but not at rest. No new chest pain, cough, or hemoptysis.  She experiences diarrhea that 'comes and goes' and manages it with Merrem as needed, typically once a day. No nausea, vomiting, or rash.  Her weight has remained stable, and she has not experienced any weight loss.       MEDICAL HISTORY: Past Medical History:  Diagnosis Date   Adenocarcinoma of left lung, stage 4 (HCC) 04/05/2015   Biopsy confirmed CT SCAN: There are innumerable  bilateral pulmonary nodules. These range in size from about 5 mm to 2 cm. They demonstrate irregular indistinct borders, the larger ones demonstrating spiculation. PET SCAN: Diffuse pulmonary metastatic disease with a 4 cm dominant left lower low lung lesion. No enlarged or hypermetabolic mediastinal or hilar adenopathy.   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  MEDICATIONS:  Current Outpatient Medications  Medication Sig Dispense Refill   afatinib dimaleate (GILOTRIF) 30 MG tablet TAKE 1 TABLET (30MG ) BY MOUTH ONCE DAILY. TAKE ON AN EMPTY STOMACH 1 HOUR BEFORE OR 2 HOURS AFTER A MEAL 30 tablet 2   ALPRAZolam (XANAX) 1 MG tablet Take 1 mg by mouth at bedtime.     aspirin 81 MG tablet Take 81 mg by mouth at bedtime.      CELEBREX 200 MG capsule Take 200 mg by mouth daily as needed.     ezetimibe (ZETIA) 10 MG tablet Take 1 tablet (10 mg total) by mouth daily. 90 tablet 3  hydrochlorothiazide (HYDRODIURIL) 25 MG tablet TAKE 1 TABLET(25 MG) BY MOUTH DAILY 30 tablet 3   HYDROcodone bit-homatropine (HYCODAN) 5-1.5 MG/5ML syrup Take 5 mLs by mouth every 6 (six) hours as needed for cough. 120 mL 0   ibuprofen (ADVIL,MOTRIN) 200 MG tablet Take 400 mg by mouth every 6 (six) hours as needed for headache or moderate pain.     levothyroxine (SYNTHROID) 50 MCG tablet TAKE 1 TABLET BY MOUTH EVERY DAY ON AN EMPTY STOMACH WITH ONLY WATER, WAIT AT LEAST 30 MINUTES BEFORE TAKING ANYTHING ELSE 90 tablet 3   losartan (COZAAR) 50 MG tablet TAKE 1  TABLET(50 MG) BY MOUTH DAILY 30 tablet 11   montelukast (SINGULAIR) 10 MG tablet TAKE 1 TABLET BY MOUTH FOR ALLERGIES 90 tablet 3   No current facility-administered medications for this visit.    SURGICAL HISTORY:  Past Surgical History:  Procedure Laterality Date   ABDOMINAL HYSTERECTOMY  10/23/1993   w BSO   CYST REMOVAL HAND Right    EXCISION MASS ABDOMINAL N/A 08/11/2021   Procedure: EXCISIONAL BIOPSY OF LEFT RETROPERITONEAL MASS, POSSIBLE INTRAOPERATIVE LAPAROSCOPIC ULTRASOUND;  Surgeon: Karie Soda, MD;  Location: [facility];  Service: General;  Laterality: N/A;   LAPAROSCOPY N/A 08/11/2021   Procedure: LAPAROSCOPIC EXPLORATION;  Surgeon: Karie Soda, MD;  Location: [facility];  Service: General;  Laterality: N/A;   TONSILLECTOMY AND ADENOIDECTOMY      REVIEW OF SYSTEMS:  Constitutional: negative Eyes: negative Ears, nose, mouth, throat, and face: negative Respiratory: positive for cough and dyspnea on exertion Cardiovascular: negative Gastrointestinal: negative Genitourinary:negative Integument/breast: negative Hematologic/lymphatic: negative Musculoskeletal:negative Neurological: negative Behavioral/Psych: negative Endocrine: negative Allergic/Immunologic: negative   PHYSICAL EXAMINATION: General appearance: alert, cooperative, and no distress Head: Normocephalic, without obvious abnormality, atraumatic Neck: no adenopathy, no JVD, supple, symmetrical, trachea midline, and thyroid not enlarged, symmetric, no tenderness/mass/nodules Lymph nodes: Cervical, supraclavicular, and axillary nodes normal. Resp: clear to auscultation bilaterally Back: symmetric, no curvature. ROM normal. No CVA tenderness. Cardio: regular rate and rhythm, S1, S2 normal, no murmur, click, rub or gallop GI: soft, non-tender; bowel sounds normal; no masses,  no organomegaly Extremities: extremities normal, atraumatic, no cyanosis or edema Neurologic: Alert and oriented X 3, normal strength and  tone. Normal symmetric reflexes. Normal coordination and gait  ECOG PERFORMANCE STATUS: 1 - Symptomatic but completely ambulatory  Blood pressure 129/73, pulse 73, temperature 97.8 F (36.6 C), temperature source Temporal, resp. rate 18, height 5' (1.524 m), weight 169 lb 4.8 oz (76.8 kg), SpO2 98%.  LABORATORY DATA: Lab Results  Component Value Date   WBC 6.2 12/03/2023   HGB 11.6 (L) 12/03/2023   HCT 36.8 12/03/2023   MCV 83.6 12/03/2023   PLT 228 12/03/2023      Chemistry      Component Value Date/Time   NA 140 12/03/2023 1111   NA 139 10/26/2017 1252   K 4.3 12/03/2023 1111   K 4.1 10/26/2017 1252   CL 104 12/03/2023 1111   CO2 31 12/03/2023 1111   CO2 28 10/26/2017 1252   BUN 20 12/03/2023 1111   BUN 15.7 10/26/2017 1252   CREATININE 0.90 12/03/2023 1111   CREATININE 1.04 (H) 10/22/2023 1218   CREATININE 1.0 10/26/2017 1252      Component Value Date/Time   CALCIUM 9.2 12/03/2023 1111   CALCIUM 8.8 10/26/2017 1252   ALKPHOS 72 12/03/2023 1111   ALKPHOS 70 10/26/2017 1252   AST 16 12/03/2023 1111   AST 19 10/26/2017 1252   ALT 11 12/03/2023 1111  ALT 22 10/26/2017 1252   BILITOT 0.9 12/03/2023 1111   BILITOT 0.90 10/26/2017 1252       RADIOGRAPHIC STUDIES: CT Chest W Contrast Result Date: 12/08/2023 CLINICAL DATA:  Non-small cell lung cancer restaging. * Tracking Code: BO * EXAM: CT CHEST, ABDOMEN, AND PELVIS WITH CONTRAST TECHNIQUE: Multidetector CT imaging of the chest, abdomen and pelvis was performed following the standard protocol during bolus administration of intravenous contrast. RADIATION DOSE REDUCTION: This exam was performed according to the departmental dose-optimization program which includes automated exposure control, adjustment of the mA and/or kV according to patient size and/or use of iterative reconstruction technique. CONTRAST:  OMNIPAQUE IOHEXOL 300 MG/ML  SOLN COMPARISON:  /5/24 FINDINGS: CT CHEST FINDINGS Cardiovascular: Mild  atheromatous vascular calcification in the thoracic aorta. Mediastinum/Nodes: Small type 1 hiatal hernia. Lungs/Pleura: Scattered scarring and faint indistinct nodularity in the lungs similar to prior. Crescentic band of scarring or therapy related findings in the left upper lobe. Stable bandlike volume loss and bronchial truncation in the left upper lobe and extending across the fissure into the left lower lobe, likely representing therapy related findings, measuring about 1.9 cm in thickness on image 67 series 6 which is stable from prior. Overall morphologically unchanged from prior. Along with the more scattered and indistinct nodularity there is a solid 1.3 by 1.1 cm right lower lobe nodule on image 67 series 6 previously measuring 1.1 by 0.9 cm by my measurements, mildly increased in size. A confluent nodular did subpleural density along the left lung apex measures 2.1 by 0.8 cm on image 18 series 6, formerly 1.8 by 0.7 cm. Musculoskeletal: Osteoid deposition and healing response associated with fractures of the left lateral third and fourth ribs similar to prior. Later phase healing of a left anterior fifth rib fracture noted. CT ABDOMEN PELVIS FINDINGS Hepatobiliary: 3.6 cm benign hemangioma posteriorly in the right hepatic lobe, image 43 series 2. Otherwise unremarkable. Pancreas: Unremarkable Spleen: Unremarkable Adrenals/Urinary Tract: The adrenal glands, kidneys, and urinary bladder appear unremarkable. Soft tissue nodule in the right posterior perirenal space measures 2.1 by 1.6 cm on image 65 series 2, formerly 2.0 by 1.7 cm by my measurements (essentially stable). Another nodule in the retrocaval/right perirenal space measures 1.6 by 1.1 cm on image 59 series 2, previously same. Stomach/Bowel: Mild sigmoid colon diverticulosis. Vascular/Lymphatic: Atherosclerosis is present, including aortoiliac atherosclerotic disease. Soft atheromatous plaque proximally in the celiac trunk. Dorsal atheromatous  calcification at the origin of the SMA without critical stenosis or occlusion. Reproductive: Uterus absent.  Adnexa unremarkable. Other: No supplemental non-categorized findings. Musculoskeletal: Degenerative arthropathy of both hips. Grade 1 degenerative anterolisthesis at L4-5 with loss of disc height at L5-S1. Degenerative disc disease at L3-4. There is central narrowing of the thecal sac and likely mild left foraminal stenosis at the L4-5 level. IMPRESSION: 1. Modest increase in size of a solid 1.3 by 1.1 cm right lower lobe nodule and a confluent nodular subpleural density along the left lung apex. These are suspicious for metastatic disease. 2. Stable soft tissue nodules in the right posterior perirenal space and retrocaval/right perirenal space. 3. Stable bandlike volume loss and bronchial truncation in the left upper lobe and extending across the fissure into the left lower lobe, likely representing therapy related findings. 4. Small type 1 hiatal hernia. 5. Mild sigmoid colon diverticulosis. 6. Degenerative arthropathy of both hips. 7. Grade 1 degenerative anterolisthesis at L4-5 with loss of disc height at L5-S1. There is central narrowing of the thecal sac and likely  mild left foraminal stenosis at the L4-5 level. 8. Aortic atherosclerosis. Aortic Atherosclerosis (ICD10-I70.0). Electronically Signed   By: Gaylyn Rong M.D.   On: 12/08/2023 13:30   CT ABDOMEN PELVIS W CONTRAST Result Date: 12/08/2023 CLINICAL DATA:  Non-small cell lung cancer restaging. * Tracking Code: BO * EXAM: CT CHEST, ABDOMEN, AND PELVIS WITH CONTRAST TECHNIQUE: Multidetector CT imaging of the chest, abdomen and pelvis was performed following the standard protocol during bolus administration of intravenous contrast. RADIATION DOSE REDUCTION: This exam was performed according to the departmental dose-optimization program which includes automated exposure control, adjustment of the mA and/or kV according to patient size and/or  use of iterative reconstruction technique. CONTRAST:  OMNIPAQUE IOHEXOL 300 MG/ML  SOLN COMPARISON:  /5/24 FINDINGS: CT CHEST FINDINGS Cardiovascular: Mild atheromatous vascular calcification in the thoracic aorta. Mediastinum/Nodes: Small type 1 hiatal hernia. Lungs/Pleura: Scattered scarring and faint indistinct nodularity in the lungs similar to prior. Crescentic band of scarring or therapy related findings in the left upper lobe. Stable bandlike volume loss and bronchial truncation in the left upper lobe and extending across the fissure into the left lower lobe, likely representing therapy related findings, measuring about 1.9 cm in thickness on image 67 series 6 which is stable from prior. Overall morphologically unchanged from prior. Along with the more scattered and indistinct nodularity there is a solid 1.3 by 1.1 cm right lower lobe nodule on image 67 series 6 previously measuring 1.1 by 0.9 cm by my measurements, mildly increased in size. A confluent nodular did subpleural density along the left lung apex measures 2.1 by 0.8 cm on image 18 series 6, formerly 1.8 by 0.7 cm. Musculoskeletal: Osteoid deposition and healing response associated with fractures of the left lateral third and fourth ribs similar to prior. Later phase healing of a left anterior fifth rib fracture noted. CT ABDOMEN PELVIS FINDINGS Hepatobiliary: 3.6 cm benign hemangioma posteriorly in the right hepatic lobe, image 43 series 2. Otherwise unremarkable. Pancreas: Unremarkable Spleen: Unremarkable Adrenals/Urinary Tract: The adrenal glands, kidneys, and urinary bladder appear unremarkable. Soft tissue nodule in the right posterior perirenal space measures 2.1 by 1.6 cm on image 65 series 2, formerly 2.0 by 1.7 cm by my measurements (essentially stable). Another nodule in the retrocaval/right perirenal space measures 1.6 by 1.1 cm on image 59 series 2, previously same. Stomach/Bowel: Mild sigmoid colon diverticulosis.  Vascular/Lymphatic: Atherosclerosis is present, including aortoiliac atherosclerotic disease. Soft atheromatous plaque proximally in the celiac trunk. Dorsal atheromatous calcification at the origin of the SMA without critical stenosis or occlusion. Reproductive: Uterus absent.  Adnexa unremarkable. Other: No supplemental non-categorized findings. Musculoskeletal: Degenerative arthropathy of both hips. Grade 1 degenerative anterolisthesis at L4-5 with loss of disc height at L5-S1. Degenerative disc disease at L3-4. There is central narrowing of the thecal sac and likely mild left foraminal stenosis at the L4-5 level. IMPRESSION: 1. Modest increase in size of a solid 1.3 by 1.1 cm right lower lobe nodule and a confluent nodular subpleural density along the left lung apex. These are suspicious for metastatic disease. 2. Stable soft tissue nodules in the right posterior perirenal space and retrocaval/right perirenal space. 3. Stable bandlike volume loss and bronchial truncation in the left upper lobe and extending across the fissure into the left lower lobe, likely representing therapy related findings. 4. Small type 1 hiatal hernia. 5. Mild sigmoid colon diverticulosis. 6. Degenerative arthropathy of both hips. 7. Grade 1 degenerative anterolisthesis at L4-5 with loss of disc height at L5-S1. There is central  narrowing of the thecal sac and likely mild left foraminal stenosis at the L4-5 level. 8. Aortic atherosclerosis. Aortic Atherosclerosis (ICD10-I70.0). Electronically Signed   By: Gaylyn Rong M.D.   On: 12/08/2023 13:30     ASSESSMENT AND PLAN:  This is a very pleasant 78 years old white female with stage IV non-small cell lung cancer, adenocarcinoma with positive EGFR mutation with deletion in exon 19 and currently undergoing treatment with Gilotrif initially at a dose of 40 mg for 9 months followed by 30 mg by mouth daily status post 93  months.  She also underwent SBRT to the enlarging left upper  lobe lung nodule. Her PET scan showed hypermetabolic subcutaneous nodule anterior to the left shoulder musculature concerning for cutaneous metastasis in addition to the enlarging nodule in the retroperitoneum anterior to the left iliacus muscle that also hypermetabolic and concerning for metastatic deposit with enlarging hypermetabolic nodule and consolidation peripherally in the left upper lobe concerning for lung cancer recurrence. She had surgical resection of the soft tissue lesion in the retroperitoneum anterior to the left iliacus muscle and this was consistent with follicular lymphoma. Her scan in September 2024 showed no concerning findings for disease progression except for enlargement of her retroperitoneal nodule posterior to the right kidney currently measuring 2.1 x 1.7 cm. 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 She has been tolerating her treatment with GILOTRIF fairly well. The patient had repeat CT scan of the chest, abdomen and pelvis performed recently.  I personally independently reviewed the scan images and discussed the result with the patient today.  Stage IV Non-Small Cell Lung Cancer (NSCLC) with EGFR Mutation Stage IV NSCLC diagnosed in June 2016 with EGFR exon 19 deletion. On Gilotrif for more than eight years. Recent scan shows modest increase in a 1.3 x 1.1 cm right lower lobe nodule and confluent subpleural density in the left lung apex, suspicious for metastasis. Overall stability in other areas with no significant growth. Reports stable weight, no new chest pain, cough, or rash. Persistent exertional dyspnea. Intermittent diarrhea managed with Merrem. Reviewed scan images, showing no significant changes in most areas, providing reassurance. Plan to continue current treatment and monitoring. - Continue Gilotrif - Follow-up in two months  with blood work - Follow-up in four months with blood work and scan.   She was advised to call immediately if she has any other concerning symptoms in the interval. The patient voices understanding of current disease status and treatment options and is in agreement with the current care plan. All questions were answered. The patient knows to call the clinic with any problems, questions or concerns. We can certainly see the patient much sooner if necessary. The total time spent in the appointment was 30 minutes.  Disclaimer: This note was dictated with voice recognition software. Similar sounding words can inadvertently be transcribed and may not be corrected upon review.

## 2023-12-19 ENCOUNTER — Other Ambulatory Visit (HOSPITAL_COMMUNITY): Payer: Self-pay

## 2023-12-24 ENCOUNTER — Encounter: Payer: Medicare Other | Admitting: Internal Medicine

## 2023-12-26 ENCOUNTER — Other Ambulatory Visit (HOSPITAL_COMMUNITY): Payer: Self-pay

## 2023-12-27 ENCOUNTER — Other Ambulatory Visit (HOSPITAL_COMMUNITY): Payer: Self-pay

## 2023-12-31 ENCOUNTER — Other Ambulatory Visit: Payer: Self-pay | Admitting: Internal Medicine

## 2023-12-31 ENCOUNTER — Other Ambulatory Visit: Payer: Self-pay

## 2023-12-31 NOTE — Progress Notes (Signed)
 Specialty Pharmacy Refill Coordination Note  Elizabeth [name] is a 78 y.o. female contacted today regarding refills of specialty medication(s) Afatinib Dimaleate (GILOTRIF)   Patient requested (Patient-Rptd) Delivery   Delivery date: (Patient-Rptd) 01/09/24   Verified address: (Patient-Rptd) [address] [place], [place] [address]   Medication will be filled on 03.18.25.   This fill date is pending response to refill request from provider. Patient is aware and if they have not received fill by intended date they must follow up with pharmacy.

## 2024-01-01 ENCOUNTER — Other Ambulatory Visit (HOSPITAL_COMMUNITY): Payer: Self-pay

## 2024-01-01 ENCOUNTER — Other Ambulatory Visit: Payer: Self-pay

## 2024-01-01 MED ORDER — AFATINIB DIMALEATE 30 MG PO TABS
ORAL_TABLET | ORAL | 2 refills | Status: DC
  Filled 2024-01-01: qty 30, 30d supply, fill #0
  Filled 2024-01-31: qty 30, 30d supply, fill #1
  Filled 2024-03-03: qty 30, 30d supply, fill #2

## 2024-01-04 ENCOUNTER — Other Ambulatory Visit: Payer: Self-pay

## 2024-01-04 DIAGNOSIS — I10 Essential (primary) hypertension: Secondary | ICD-10-CM

## 2024-01-04 MED ORDER — HYDROCHLOROTHIAZIDE 25 MG PO TABS: 25.0000 mg | ORAL_TABLET | Freq: Every day | ORAL | 3 refills | Status: DC

## 2024-01-21 ENCOUNTER — Encounter: Payer: Medicare Other | Admitting: Internal Medicine

## 2024-01-24 ENCOUNTER — Ambulatory Visit (INDEPENDENT_AMBULATORY_CARE_PROVIDER_SITE_OTHER): Payer: Medicare Other | Admitting: Family Medicine

## 2024-01-24 ENCOUNTER — Encounter: Payer: Self-pay | Admitting: Family Medicine

## 2024-01-24 VITALS — BP 122/82 | HR 70 | Temp 97.7°F | Ht 60.0 in | Wt 168.0 lb

## 2024-01-24 DIAGNOSIS — G4700 Insomnia, unspecified: Secondary | ICD-10-CM | POA: Diagnosis not present

## 2024-01-24 DIAGNOSIS — E039 Hypothyroidism, unspecified: Secondary | ICD-10-CM

## 2024-01-24 DIAGNOSIS — I10 Essential (primary) hypertension: Secondary | ICD-10-CM | POA: Diagnosis not present

## 2024-01-24 DIAGNOSIS — Z7689 Persons encountering health services in other specified circumstances: Secondary | ICD-10-CM

## 2024-01-24 DIAGNOSIS — E782 Mixed hyperlipidemia: Secondary | ICD-10-CM | POA: Diagnosis not present

## 2024-01-24 DIAGNOSIS — F419 Anxiety disorder, unspecified: Secondary | ICD-10-CM

## 2024-01-24 DIAGNOSIS — J302 Other seasonal allergic rhinitis: Secondary | ICD-10-CM | POA: Insufficient documentation

## 2024-01-24 LAB — LIPID PANEL
Cholesterol: 147 mg/dL (ref 0–200)
HDL: 49.2 mg/dL (ref >39.00)
LDL Cholesterol: 81 mg/dL (ref 0–99)
NonHDL: 98.04
Total CHOL/HDL Ratio: 3
Triglycerides: 83 mg/dL (ref 0.0–149.0)
VLDL: 16.6 mg/dL (ref 0.0–40.0)

## 2024-01-24 LAB — TSH: TSH: 2.7 u[IU]/mL (ref 0.35–5.50)

## 2024-01-24 MED ORDER — ALPRAZOLAM 1 MG PO TABS: 1.0000 mg | ORAL_TABLET | Freq: Every day | ORAL | Status: DC

## 2024-01-24 NOTE — Assessment & Plan Note (Signed)
 Stable. Continue with Montelukast 10mg  daily.

## 2024-01-24 NOTE — Assessment & Plan Note (Addendum)
 Agreed to take over Alprazolam for anxiety and insomnia. Patient is taking 1mg  at night time and 1/2 tablet if needed. She is aware of the risk of taking medication at her age. Patient signed Controlled Substance Agreement that will need to renewed yearly and UDS obtained. This will need to be completed periodically. Advised to cancel appointment with [facility]. Reviewed PDMP, last refill on 02/24. Refilled medication.

## 2024-01-24 NOTE — Assessment & Plan Note (Signed)
Stable. Continue Levothyroxine daily. Ordered TSH.

## 2024-01-24 NOTE — Assessment & Plan Note (Signed)
 Stable. Continue taking Zetia 10mg . Ordered lipid panel. CMP was completed in Feb 2025 with stable liver enzymes.

## 2024-01-24 NOTE — Patient Instructions (Addendum)
-  It was nice to meet you and look forward to taking care of you.  -Continue all prescribed medication. -Agreed to take over the management of Alprazolam for anxiety and insomnia. Signed controlled substance agreement today. This will need to be renewed yearly. Urine drug screen obtained today and will need to be completed periodically.  -Ordered labs.  -Follow up in 3 months.

## 2024-01-24 NOTE — Progress Notes (Signed)
 New Patient Office Visit  Subjective   Patient ID: Elizabeth [name], female    DOB: 04/12/1946  Age: 78 y.o. MRN: [id number]  CC:  Chief Complaint  Patient presents with   Establish Care    HPI Elizabeth [name] presents to establish care with new provider.   Patients previous primary care provider was [facility] with Dr. Lucky Cowboy.   Specialist: [facility]-Dr. Si Gaul  [facility]-Dr. Kip Corrington  [facility]  [facility]-Dr. Marcene Corning   HTN: Chronic. Patient is taking Losartan 50mg  and HCTZ 25mg  daily. Denies monitoring her blood pressure at home. Denies CP, dizziness, lightheadedness, or lower extremity edema.  SHOB on exertion, but not at rest.  BP Readings from Last 3 Encounters:  01/24/24 122/82  12/10/23 129/73  10/22/23 124/76    Hyperlipidemia: Chronic. Patient is taking Zetia 10mg  daily. Never been on a statin for cholesterol.  Lab Results  Component Value Date   CHOL 162 10/22/2023   HDL 48 (L) 10/22/2023   LDLCALC 94 10/22/2023   TRIG 100 10/22/2023   CHOLHDL 3.4 10/22/2023    Hypothyroidism: Chronic. Patient is taking Levothyroxine daily.  Lab Results  Component Value Date   TSH 2.28 10/22/2023    Seasonal allergies: Chronic. Patient is taking Montelukast 10mg  daily. Effective.   Insomnia/Anxiety: Patient is taking Alprazolam 1mg  every night for insomnia and anxiety. Sometimes she will take an additional 1/2 tablet to help her sleep. She reports a lot of times her brain will not just shut down. Dr. Royston Sinner Corrington with [facility] has been managing this medication since her previous PCP, Dr. Lucky Cowboy, didn't like to manage Alprazolam. She has been on the medication for years. Based on chart review from previous provider, she has been compliant with taking medication.   Outpatient Encounter  Medications as of 01/24/2024  Medication Sig   afatinib dimaleate (GILOTRIF) 30 MG tablet TAKE 1 TABLET (30MG ) BY MOUTH ONCE DAILY. TAKE ON AN EMPTY STOMACH 1 HOUR BEFORE OR 2 HOURS AFTER A MEAL   aspirin 81 MG tablet Take 81 mg by mouth at bedtime.    CELEBREX 200 MG capsule Take 200 mg by mouth daily as needed.   ezetimibe (ZETIA) 10 MG tablet Take 1 tablet (10 mg total) by mouth daily.   hydrochlorothiazide (HYDRODIURIL) 25 MG tablet Take 1 tablet (25 mg total) by mouth daily.   HYDROcodone bit-homatropine (HYCODAN) 5-1.5 MG/5ML syrup Take 5 mLs by mouth every 6 (six) hours as needed for cough.   ibuprofen (ADVIL,MOTRIN) 200 MG tablet Take 400 mg by mouth every 6 (six) hours as needed for headache or moderate pain.   levothyroxine (SYNTHROID) 50 MCG tablet TAKE 1 TABLET BY MOUTH EVERY DAY ON AN EMPTY STOMACH WITH ONLY WATER, WAIT AT LEAST 30 MINUTES BEFORE TAKING ANYTHING ELSE   losartan (COZAAR) 50 MG tablet TAKE 1 TABLET(50 MG) BY MOUTH DAILY   montelukast (SINGULAIR) 10 MG tablet TAKE 1 TABLET BY MOUTH FOR ALLERGIES   omeprazole (PRILOSEC) 20 MG capsule Take 20 mg by mouth daily as needed.   [DISCONTINUED] ALPRAZolam (XANAX) 1 MG tablet Take 1 mg by mouth at bedtime. TAKE 1 TABLET BY MOUTH AT NIGHT. MAY TAKE 1/2 TABLET EXTRA AS NEEDED   ALPRAZolam (XANAX) 1 MG tablet Take 1 tablet (1 mg total) by mouth at bedtime. TAKE 1 TABLET BY MOUTH AT NIGHT. MAY TAKE 1/2  TABLET EXTRA AS NEEDED.   No facility-administered encounter medications on file as of 01/24/2024.    Past Medical History:  Diagnosis Date   Adenocarcinoma of left lung, stage 4 (HCC) 04/05/2015   Biopsy confirmed CT SCAN: There are innumerable bilateral pulmonary nodules. These range in size from about 5 mm to 2 cm. They demonstrate irregular indistinct borders, the larger ones demonstrating spiculation. PET SCAN: Diffuse pulmonary metastatic disease with a 4 cm dominant left lower low lung lesion. No enlarged or hypermetabolic  mediastinal or hilar adenopathy.   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   Cancer (HCC)    Change of skin related to chemotherapy 09/25/2017   Drug-induced skin rash 01/17/2016   History of radiation therapy 12/18/2019   SBRT left lung  12/11/2019-12/18/2019   Dr Antony Blackbird   History of radiation therapy 06/21/2021   left lung 06/14/2021-06/21/2021 Dr Antony Blackbird   Hyperlipidemia    Hypertension    Hypothyroidism    Insomnia    Prediabetes    Thyroid disease     Past Surgical History:  Procedure Laterality Date   ABDOMINAL HYSTERECTOMY  10/23/1993   w BSO   CYST REMOVAL HAND Right    EXCISION MASS ABDOMINAL N/A 08/11/2021   Procedure: EXCISIONAL BIOPSY OF LEFT RETROPERITONEAL MASS, POSSIBLE INTRAOPERATIVE LAPAROSCOPIC ULTRASOUND;  Surgeon: Karie Soda, MD;  Location: [facility];  Service: General;  Laterality: N/A;   LAPAROSCOPY N/A 08/11/2021   Procedure: LAPAROSCOPIC EXPLORATION;  Surgeon: Karie Soda, MD;  Location: [facility];  Service: General;  Laterality: N/A;   TONSILLECTOMY AND ADENOIDECTOMY      Family History  Problem Relation Age of Onset   Liver disease Mother    Alcohol abuse Mother    ALS Father    Hypertension Father    Melanoma Paternal Aunt    Dementia Paternal Aunt 50   Bone cancer Paternal Uncle    Dementia Maternal Grandmother 25   Lung disease Maternal Grandfather    Lung cancer Paternal Grandmother 2   Alcohol abuse Paternal Grandfather    Heart disease Paternal Grandfather    Breast cancer Cousin     Social History   Socioeconomic History   Marital status: Married    Spouse name: Not on file   Number of children: 2   Years of education: Not on file   Highest education level: Associate degree: occupational, Scientist, product/process development, or vocational program  Occupational History   Not on file  Tobacco Use   Smoking status: Former    Current packs/day: 0.00    Average packs/day: 0.5 packs/day for 10.0 years (5.0 ttl pk-yrs)     Types: Cigarettes    Start date: 10/23/1964    Quit date: 10/23/1974    Years since quitting: 49.2   Smokeless tobacco: Never  Vaping Use   Vaping status: Never Used  Substance and Sexual Activity   Alcohol use: No   Drug use: No   Sexual activity: Not on file  Other Topics Concern   Not on file  Social History Narrative   Not on file   Social Drivers of Health   Financial Resource Strain: Low Risk  (01/22/2024)   Overall Financial Resource Strain (CARDIA)    Difficulty of Paying Living Expenses: Not hard at all  Food Insecurity: No Food Insecurity (01/22/2024)   Hunger Vital Sign    Worried About Running Out of Food in the Last Year: Never true    Ran Out  of Food in the Last Year: Never true  Transportation Needs: No Transportation Needs (01/22/2024)   PRAPARE - Administrator, Civil Service (Medical): No    Lack of Transportation (Non-Medical): No  Physical Activity: Inactive (01/24/2024)   Exercise Vital Sign    Days of Exercise per Week: 0 days    Minutes of Exercise per Session: 0 min  Stress: Stress Concern Present (01/22/2024)   [employer] of Occupational Health - Occupational Stress Questionnaire    Feeling of Stress : To some extent  Social Connections: Moderately Isolated (01/22/2024)   Social Connection and Isolation Panel [NHANES]    Frequency of Communication with Friends and Family: More than three times a week    Frequency of Social Gatherings with Friends and Family: Once a week    Attends Religious Services: Never    Database administrator or Organizations: No    Attends Engineer, structural: Not on file    Marital Status: Married  Catering manager Violence: Not At Risk (04/17/2023)   Received from [employer], [facility]   HITS    Over the last 12 months how often did your partner physically hurt you?: Never    Over the last 12 months how often did your partner insult you or talk down to you?: Never    Over the last 12 months how often  did your partner threaten you with physical harm?: Never    Over the last 12 months how often did your partner scream or curse at you?: Never    ROS See HPI above    Objective  BP 122/82   Pulse 70   Temp 97.7 F (36.5 C) (Oral)   Ht 5' (1.524 m)   Wt 168 lb (76.2 kg)   SpO2 98%   BMI 32.81 kg/m   Physical Exam Vitals reviewed.  Constitutional:      General: She is not in acute distress.    Appearance: Normal appearance. She is not ill-appearing, toxic-appearing or diaphoretic.  HENT:     Head: Normocephalic and atraumatic.  Eyes:     General:        Right eye: No discharge.        Left eye: No discharge.     Conjunctiva/sclera: Conjunctivae normal.  Cardiovascular:     Rate and Rhythm: Normal rate and regular rhythm.     Heart sounds: Normal heart sounds. No murmur heard.    No friction rub. No gallop.  Pulmonary:     Effort: Pulmonary effort is normal. No respiratory distress.     Breath sounds: Normal breath sounds.  Musculoskeletal:        General: Normal range of motion.  Skin:    General: Skin is warm and dry.  Neurological:     General: No focal deficit present.     Mental Status: She is alert and oriented to person, place, and time. Mental status is at baseline.  Psychiatric:        Mood and Affect: Mood normal.        Behavior: Behavior normal.        Thought Content: Thought content normal.        Judgment: Judgment normal.      Assessment & Plan:  Essential hypertension Assessment & Plan: Stable. Continue with Losartan 50mg  and HCTZ 25mg  daily. Last CMP in February, she had stable kidney function and potassium was in normal limits.    Acquired hypothyroidism Assessment & Plan:  Stable. Continue Levothyroxine daily. Ordered TSH.   Orders: -     TSH  Hyperlipidemia, mixed Assessment & Plan: Stable. Continue taking Zetia 10mg . Ordered lipid panel. CMP was completed in Feb 2025 with stable liver enzymes.   Orders: -     Lipid  panel  Insomnia, unspecified type Assessment & Plan: Agreed to take over Alprazolam for anxiety and insomnia. Patient is taking 1mg  at night time and 1/2 tablet if needed. She is aware of the risk of taking medication at her age. Patient signed Controlled Substance Agreement that will need to renewed yearly and UDS obtained. This will need to be completed periodically. Advised to cancel appointment with [facility]. Reviewed PDMP, last refill on 02/24. Refilled medication.   Orders: -     DRUG MONITOR, PANEL 1, SCREEN, URINE -     ALPRAZolam; Take 1 tablet (1 mg total) by mouth at bedtime. TAKE 1 TABLET BY MOUTH AT NIGHT. MAY TAKE 1/2 TABLET EXTRA AS NEEDED.  Anxiety Assessment & Plan: Agreed to take over Alprazolam for anxiety and insomnia. Patient is taking 1mg  at night time and 1/2 tablet if needed. She is aware of the risk of taking medication at her age. Patient signed Controlled Substance Agreement that will need to renewed yearly and UDS obtained. This will need to be completed periodically. Advised to cancel appointment with [facility]. Reviewed PDMP, last refill on 02/24. Refilled medication.   Orders: -     DRUG MONITOR, PANEL 1, SCREEN, URINE -     ALPRAZolam; Take 1 tablet (1 mg total) by mouth at bedtime. TAKE 1 TABLET BY MOUTH AT NIGHT. MAY TAKE 1/2 TABLET EXTRA AS NEEDED.  Seasonal allergies Assessment & Plan: Stable. Continue with Montelukast 10mg  daily.    Encounter to establish care  1.Review health maintenance:  -Covid booster: Declines  -Zoster vaccine: Declines  -Colonoscopy: Decided against it after the age of 57 years  -Tdap: Local pharmacy or if injury occurs   Return in about 3 months (around 04/24/2024) for chronic management.   Zandra Abts, NP

## 2024-01-24 NOTE — Assessment & Plan Note (Signed)
 Stable. Continue with Losartan 50mg  and HCTZ 25mg  daily. Last CMP in February, she had stable kidney function and potassium was in normal limits.

## 2024-01-25 ENCOUNTER — Encounter: Payer: Self-pay | Admitting: Family Medicine

## 2024-01-25 LAB — DRUG MONITOR, PANEL 1, SCREEN, URINE
Amphetamines: NEGATIVE ng/mL (ref <500)
Barbiturates: NEGATIVE ng/mL (ref <300)
Benzodiazepines: POSITIVE ng/mL — AB (ref <100)
Cocaine Metabolite: NEGATIVE ng/mL (ref <150)
Creatinine: 135.9 mg/dL (ref >=20.0)
Marijuana Metabolite: NEGATIVE ng/mL (ref <20)
Methadone Metabolite: NEGATIVE ng/mL (ref <100)
Opiates: NEGATIVE ng/mL (ref <100)
Oxidant: NEGATIVE ug/mL (ref <200)
Oxycodone: NEGATIVE ng/mL (ref <100)
Phencyclidine: NEGATIVE ng/mL (ref <25)
pH: 6.6 (ref 4.5–9.0)

## 2024-01-25 LAB — DM TEMPLATE

## 2024-01-31 ENCOUNTER — Other Ambulatory Visit: Payer: Self-pay

## 2024-01-31 NOTE — Progress Notes (Signed)
 Specialty Pharmacy Refill Coordination Note  Elizabeth [name] is a 78 y.o. female contacted today regarding refills of specialty medication(s) Afatinib Dimaleate (GILOTRIF)   Patient requested (Patient-Rptd) Delivery   Delivery date: (Patient-Rptd) 02/08/24   Verified address: (Patient-Rptd) [address], [place] [address]   Medication will be filled on 04.17.25.

## 2024-02-07 ENCOUNTER — Inpatient Hospital Stay (HOSPITAL_BASED_OUTPATIENT_CLINIC_OR_DEPARTMENT_OTHER): Payer: Medicare Other | Admitting: Internal Medicine

## 2024-02-07 ENCOUNTER — Inpatient Hospital Stay: Payer: Medicare Other | Attending: Internal Medicine

## 2024-02-07 ENCOUNTER — Other Ambulatory Visit: Payer: Self-pay

## 2024-02-07 ENCOUNTER — Telehealth: Payer: Self-pay | Admitting: Internal Medicine

## 2024-02-07 VITALS — BP 123/75 | HR 72 | Temp 97.6°F | Resp 17 | Ht 60.0 in | Wt 167.5 lb

## 2024-02-07 DIAGNOSIS — C3492 Malignant neoplasm of unspecified part of left bronchus or lung: Secondary | ICD-10-CM

## 2024-02-07 DIAGNOSIS — Z7982 Long term (current) use of aspirin: Secondary | ICD-10-CM | POA: Insufficient documentation

## 2024-02-07 DIAGNOSIS — Z79899 Other long term (current) drug therapy: Secondary | ICD-10-CM | POA: Diagnosis not present

## 2024-02-07 DIAGNOSIS — K219 Gastro-esophageal reflux disease without esophagitis: Secondary | ICD-10-CM | POA: Diagnosis not present

## 2024-02-07 DIAGNOSIS — C3490 Malignant neoplasm of unspecified part of unspecified bronchus or lung: Secondary | ICD-10-CM | POA: Diagnosis not present

## 2024-02-07 DIAGNOSIS — Z87440 Personal history of urinary (tract) infections: Secondary | ICD-10-CM | POA: Insufficient documentation

## 2024-02-07 DIAGNOSIS — C3432 Malignant neoplasm of lower lobe, left bronchus or lung: Secondary | ICD-10-CM | POA: Diagnosis present

## 2024-02-07 DIAGNOSIS — D649 Anemia, unspecified: Secondary | ICD-10-CM | POA: Insufficient documentation

## 2024-02-07 LAB — CBC WITH DIFFERENTIAL (CANCER CENTER ONLY)
Abs Immature Granulocytes: 0.02 10*3/uL (ref 0.00–0.07)
Basophils Absolute: 0.1 10*3/uL (ref 0.0–0.1)
Basophils Relative: 1 %
Eosinophils Absolute: 0.1 10*3/uL (ref 0.0–0.5)
Eosinophils Relative: 2 %
HCT: 36 % (ref 36.0–46.0)
Hemoglobin: 11.9 g/dL — ABNORMAL LOW (ref 12.0–15.0)
Immature Granulocytes: 0 %
Lymphocytes Relative: 17 %
Lymphs Abs: 1.2 10*3/uL (ref 0.7–4.0)
MCH: 26.2 pg (ref 26.0–34.0)
MCHC: 33.1 g/dL (ref 30.0–36.0)
MCV: 79.3 fL — ABNORMAL LOW (ref 80.0–100.0)
Monocytes Absolute: 0.7 10*3/uL (ref 0.1–1.0)
Monocytes Relative: 10 %
Neutro Abs: 5 10*3/uL (ref 1.7–7.7)
Neutrophils Relative %: 70 %
Platelet Count: 223 10*3/uL (ref 150–400)
RBC: 4.54 MIL/uL (ref 3.87–5.11)
RDW: 13.5 % (ref 11.5–15.5)
WBC Count: 7 10*3/uL (ref 4.0–10.5)
nRBC: 0 % (ref 0.0–0.2)

## 2024-02-07 LAB — CMP (CANCER CENTER ONLY)
ALT: 12 U/L (ref 0–44)
AST: 16 U/L (ref 15–41)
Albumin: 4 g/dL (ref 3.5–5.0)
Alkaline Phosphatase: 63 U/L (ref 38–126)
Anion gap: 5 (ref 5–15)
BUN: 18 mg/dL (ref 8–23)
CO2: 30 mmol/L (ref 22–32)
Calcium: 9.1 mg/dL (ref 8.9–10.3)
Chloride: 102 mmol/L (ref 98–111)
Creatinine: 0.96 mg/dL (ref 0.44–1.00)
GFR, Estimated: >60 mL/min (ref >60)
Glucose, Bld: 93 mg/dL (ref 70–99)
Potassium: 4 mmol/L (ref 3.5–5.1)
Sodium: 137 mmol/L (ref 135–145)
Total Bilirubin: 0.9 mg/dL (ref 0.0–1.2)
Total Protein: 6.9 g/dL (ref 6.5–8.1)

## 2024-02-07 NOTE — Progress Notes (Signed)
 [facility] [phone]   [phone]  OFFICE PROGRESS NOTE  No primary care provider on file. No primary provider on file.  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 2) SBRT to the enlarging left upper lobe nodule under the care of Dr. Mitzi Hansen.  CURRENT THERAPY: Gilotrif 30 mg by mouth daily started 02/21/2016 status post 95 months of treatment.  INTERVAL HISTORY: Elizabeth [name] 78 y.o. female returns to the clinic today for follow-up visit. Discussed the use of AI scribe software for clinical note transcription with the patient, who gave verbal consent to proceed.  History of Present Illness   Elizabeth [name] is a 78 year old female with stage four non-small cell lung cancer who presents for evaluation and repeat blood work.  Diagnosed with stage four non-small cell lung cancer, adenocarcinoma with a positive EGFR mutation with deletion in exon nineteen, in June 2016. She has been on Gilotrif since July 2016, currently on a reduced dose of 30 mg per day, having completed 95 months of treatment.  Experiences intermittent pain in the left ribcage and down the arm, associated with a previous rib fracture that has calcified. The pain is intermittent, and she sometimes perceives the ribs as 'rolling around'.  Experiences shortness of breath and has adapted by limiting activities. No new chest pain or cough. Regular use of omeprazole has alleviated symptoms attributed to acid reflux, and she has not required cough medicine recently.  Reports a past episode of creamy urine,  initially suspected as a urinary tract infection, later attributed to white blood cells in the urine. No burning or discomfort with urination, but urine remains cloudy at times. She associates this with her mild anemia, which she believes is due to losing white blood cells through urine.  Current medications include Gilotrif 30 mg daily and omeprazole. History of mild anemia, with hemoglobin slightly below normal but improved from previous levels.       MEDICAL HISTORY: Past Medical History:  Diagnosis Date   Adenocarcinoma of left lung, stage 4 (HCC) 04/05/2015   Biopsy confirmed CT SCAN: There are innumerable bilateral pulmonary nodules. These range in size from about 5 mm to 2 cm. They demonstrate irregular indistinct borders, the larger ones demonstrating spiculation. PET SCAN: Diffuse pulmonary metastatic disease with a 4 cm dominant left lower low lung lesion. No enlarged or hypermetabolic mediastinal or hilar adenopathy.   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  MEDICATIONS:  Current Outpatient Medications  Medication Sig Dispense Refill   afatinib dimaleate (GILOTRIF) 30 MG tablet TAKE  1 TABLET (30MG ) BY MOUTH ONCE DAILY. TAKE ON AN EMPTY STOMACH 1 HOUR BEFORE OR 2 HOURS AFTER A MEAL 30 tablet 2   ALPRAZolam (XANAX) 1 MG tablet Take 1 tablet (1 mg total) by mouth at bedtime. TAKE 1 TABLET BY MOUTH AT NIGHT. MAY TAKE 1/2 TABLET EXTRA AS NEEDED.     aspirin 81 MG tablet Take 81 mg by mouth at bedtime.      CELEBREX 200 MG  capsule Take 200 mg by mouth daily as needed.     ezetimibe (ZETIA) 10 MG tablet Take 1 tablet (10 mg total) by mouth daily. 90 tablet 3   hydrochlorothiazide (HYDRODIURIL) 25 MG tablet Take 1 tablet (25 mg total) by mouth daily. 30 tablet 3   HYDROcodone bit-homatropine (HYCODAN) 5-1.5 MG/5ML syrup Take 5 mLs by mouth every 6 (six) hours as needed for cough. 120 mL 0   ibuprofen (ADVIL,MOTRIN) 200 MG tablet Take 400 mg by mouth every 6 (six) hours as needed for headache or moderate pain.     levothyroxine (SYNTHROID) 50 MCG tablet TAKE 1 TABLET BY MOUTH EVERY DAY ON AN EMPTY STOMACH WITH ONLY WATER, WAIT AT LEAST 30 MINUTES BEFORE TAKING ANYTHING ELSE 90 tablet 3   losartan (COZAAR) 50 MG tablet TAKE 1 TABLET(50 MG) BY MOUTH DAILY 30 tablet 11   montelukast (SINGULAIR) 10 MG tablet TAKE 1 TABLET BY MOUTH FOR ALLERGIES 90 tablet 3   omeprazole (PRILOSEC) 20 MG capsule Take 20 mg by mouth daily as needed.     No current facility-administered medications for this visit.    SURGICAL HISTORY:  Past Surgical History:  Procedure Laterality Date   ABDOMINAL HYSTERECTOMY  10/23/1993   w BSO   CYST REMOVAL HAND Right    EXCISION MASS ABDOMINAL N/A 08/11/2021   Procedure: EXCISIONAL BIOPSY OF LEFT RETROPERITONEAL MASS, POSSIBLE INTRAOPERATIVE LAPAROSCOPIC ULTRASOUND;  Surgeon: Karie Soda, MD;  Location: [facility];  Service: General;  Laterality: N/A;   LAPAROSCOPY N/A 08/11/2021   Procedure: LAPAROSCOPIC EXPLORATION;  Surgeon: Karie Soda, MD;  Location: [facility];  Service: General;  Laterality: N/A;   TONSILLECTOMY AND ADENOIDECTOMY      REVIEW OF SYSTEMS:  Constitutional: positive for fatigue Eyes: negative Ears, nose, mouth, throat, and face: negative Respiratory: positive for dyspnea on exertion and pleurisy/chest pain Cardiovascular: negative Gastrointestinal: negative Genitourinary:negative Integument/breast: negative Hematologic/lymphatic:  negative Musculoskeletal:negative Neurological: negative Behavioral/Psych: negative Endocrine: negative Allergic/Immunologic: negative   PHYSICAL EXAMINATION: General appearance: alert, cooperative, fatigued, and no distress Head: Normocephalic, without obvious abnormality, atraumatic Neck: no adenopathy, no JVD, supple, symmetrical, trachea midline, and thyroid not enlarged, symmetric, no tenderness/mass/nodules Lymph nodes: Cervical, supraclavicular, and axillary nodes normal. Resp: clear to auscultation bilaterally Back: symmetric, no curvature. ROM normal. No CVA tenderness. Cardio: regular rate and rhythm, S1, S2 normal, no murmur, click, rub or gallop GI: soft, non-tender; bowel sounds normal; no masses,  no organomegaly Extremities: extremities normal, atraumatic, no cyanosis or edema Neurologic: Alert and oriented X 3, normal strength and tone. Normal symmetric reflexes. Normal coordination and gait  ECOG PERFORMANCE STATUS: 1 - Symptomatic but completely ambulatory  Blood pressure 123/75, pulse 72, temperature 97.6 F (36.4 C), temperature source Temporal, resp. rate 17, height 5' (1.524 m), weight 167 lb 8 oz (76 kg), SpO2 99%.  LABORATORY DATA: Lab Results  Component Value Date   WBC 7.0 02/07/2024   HGB 11.9 (L) 02/07/2024   HCT 36.0 02/07/2024   MCV 79.3 (L) 02/07/2024   PLT 223 02/07/2024  Chemistry      Component Value Date/Time   NA 140 12/03/2023 1111   NA 139 10/26/2017 1252   K 4.3 12/03/2023 1111   K 4.1 10/26/2017 1252   CL 104 12/03/2023 1111   CO2 31 12/03/2023 1111   CO2 28 10/26/2017 1252   BUN 20 12/03/2023 1111   BUN 15.7 10/26/2017 1252   CREATININE 0.90 12/03/2023 1111   CREATININE 1.04 (H) 10/22/2023 1218   CREATININE 1.0 10/26/2017 1252      Component Value Date/Time   CALCIUM 9.2 12/03/2023 1111   CALCIUM 8.8 10/26/2017 1252   ALKPHOS 72 12/03/2023 1111   ALKPHOS 70 10/26/2017 1252   AST 16 12/03/2023 1111   AST 19 10/26/2017  1252   ALT 11 12/03/2023 1111   ALT 22 10/26/2017 1252   BILITOT 0.9 12/03/2023 1111   BILITOT 0.90 10/26/2017 1252       RADIOGRAPHIC STUDIES: No results found.    ASSESSMENT AND PLAN:  This is a very pleasant 78 years old white female with stage IV non-small cell lung cancer, adenocarcinoma with positive EGFR mutation with deletion in exon 19 and currently undergoing treatment with Gilotrif initially at a dose of 40 mg for 9 months followed by 30 mg by mouth daily status post 95  months.  She also underwent SBRT to the enlarging left upper lobe lung nodule. Her PET scan showed hypermetabolic subcutaneous nodule anterior to the left shoulder musculature concerning for cutaneous metastasis in addition to the enlarging nodule in the retroperitoneum anterior to the left iliacus muscle that also hypermetabolic and concerning for metastatic deposit with enlarging hypermetabolic nodule and consolidation peripherally in the left upper lobe concerning for lung cancer recurrence. She had surgical resection of the soft tissue lesion in the retroperitoneum anterior to the left iliacus muscle and this was consistent with follicular lymphoma. Her scan in September 2024 showed no concerning findings for disease progression except for enlargement of her retroperitoneal nodule posterior to the right kidney currently measuring 2.1 x 1.7 cm. 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 She has been tolerating her treatment with GILOTRIF fairly well.    Stage IV non-small cell lung cancer (NSCLC) with EGFR mutation Stage IV NSCLC, adenocarcinoma subtype, with positive EGFR mutation (exon 19 deletion), diagnosed in June 2016. Currently on 96th month of afatinib at a reduced dose of 30 mg daily. She reports doing well with no new significant complaints. Mild anemia noted, but no  concerning findings in CBC. No new chest pain or cough, and shortness of breath is managed by limiting activity. Continues to experience intermittent left ribcage and arm pain, possibly related to previous rib fracture. She has been on afatinib for 104 months, showing remarkable response to treatment. - Continue afatinib 30 mg daily - Order scan in two months to assess disease status - Monitor symptoms, especially left ribcage and arm pain - Perform lab work and scan 10 days prior to next appointment  Mild anemia Mild anemia with hemoglobin at 11.9, which is an improvement from previous levels. Anemia may be related to cancer treatment or other factors. No immediate concerns from CBC results. - Monitor hemoglobin levels in upcoming lab work  Urinary symptoms with possible past UTI Reports past urinary symptoms with cloudy urine and was treated for a UTI, but symptoms persist without burning or discomfort. No current urine analysis available for review. White  blood cell count is normal, suggesting no active infection. She suspects anemia may be related to urinary issues. - Consider urine analysis if symptoms persist or worsen  Gastroesophageal reflux disease (GERD) Reports improvement in symptoms with regular use of omeprazole. No current need for cough medicine, indicating control of reflux symptoms. - Continue omeprazole as needed for GERD symptoms   The patient was advised to call immediately if she has any concerning symptoms in the interval. The patient voices understanding of current disease status and treatment options and is in agreement with the current care plan. All questions were answered. The patient knows to call the clinic with any problems, questions or concerns. We can certainly see the patient much sooner if necessary. The total time spent in the appointment was 30 minutes.  Disclaimer: This note was dictated with voice recognition software. Similar sounding words can  inadvertently be transcribed and may not be corrected upon review.

## 2024-02-07 NOTE — Telephone Encounter (Signed)
 Scheduled appointments around a CT scan expected date. The patient is aware of the appointment details and is active on MyChart.

## 2024-03-03 ENCOUNTER — Other Ambulatory Visit (HOSPITAL_COMMUNITY): Payer: Self-pay

## 2024-03-03 NOTE — Progress Notes (Signed)
 Specialty Pharmacy Ongoing Clinical Assessment Note  Elizabeth [name] is a 78 y.o. female who is being followed by the specialty pharmacy service for RxSp Oncology   Patient's specialty medication(s) reviewed today: Afatinib  Dimaleate (GILOTRIF )   Missed doses in the last 4 weeks: 0   Patient/Caregiver did not have any additional questions or concerns.   Therapeutic benefit summary: Patient is achieving benefit   Adverse events/side effects summary: No adverse events/side effects   Patient's therapy is appropriate to: Continue    Goals Addressed             This Visit's Progress    Slow Disease Progression   On track    Patient is on track. Patient will maintain adherence. Per Dr. Bing Buff note from 02/07/24 she is seeing a remarkable response to treatment and she will have repeat scans in 2 months.          Follow up: 6 months  Malachi Screws Specialty Pharmacist

## 2024-03-03 NOTE — Progress Notes (Signed)
 Specialty Pharmacy Refill Coordination Note  Elizabeth [name] is a 78 y.o. female contacted today regarding refills of specialty medication(s) Afatinib  Dimaleate (GILOTRIF )   Patient requested Delivery   Delivery date: 03/07/24   Verified address: [address], [place] [address]   Medication will be filled on 03/06/24.

## 2024-03-05 ENCOUNTER — Encounter: Payer: Self-pay | Admitting: Family Medicine

## 2024-03-06 ENCOUNTER — Other Ambulatory Visit: Payer: Self-pay | Admitting: Family Medicine

## 2024-03-06 DIAGNOSIS — F419 Anxiety disorder, unspecified: Secondary | ICD-10-CM

## 2024-03-06 DIAGNOSIS — G4700 Insomnia, unspecified: Secondary | ICD-10-CM

## 2024-03-06 MED ORDER — ALPRAZOLAM 1 MG PO TABS: 1.0000 mg | ORAL_TABLET | Freq: Every day | ORAL | 0 refills | Status: DC

## 2024-03-06 NOTE — Progress Notes (Signed)
 Refilling medication. PDMP reviewed. Last refilled on 01/26/2024. UDS and controlled substance contract UTD.

## 2024-03-21 ENCOUNTER — Other Ambulatory Visit: Payer: Self-pay

## 2024-03-28 ENCOUNTER — Ambulatory Visit (HOSPITAL_COMMUNITY)

## 2024-03-28 ENCOUNTER — Inpatient Hospital Stay: Attending: Internal Medicine

## 2024-03-28 ENCOUNTER — Encounter (HOSPITAL_COMMUNITY): Payer: Self-pay

## 2024-03-28 ENCOUNTER — Other Ambulatory Visit: Payer: Self-pay | Admitting: Internal Medicine

## 2024-03-28 ENCOUNTER — Ambulatory Visit (HOSPITAL_COMMUNITY)
Admission: RE | Admit: 2024-03-28 | Discharge: 2024-03-28 | Disposition: A | Discharge status: Home / Self Care | Source: Ambulatory Visit | Attending: Internal Medicine | Admitting: Internal Medicine

## 2024-03-28 DIAGNOSIS — I2510 Atherosclerotic heart disease of native coronary artery without angina pectoris: Secondary | ICD-10-CM | POA: Insufficient documentation

## 2024-03-28 DIAGNOSIS — E785 Hyperlipidemia, unspecified: Secondary | ICD-10-CM | POA: Diagnosis not present

## 2024-03-28 DIAGNOSIS — C3490 Malignant neoplasm of unspecified part of unspecified bronchus or lung: Secondary | ICD-10-CM

## 2024-03-28 DIAGNOSIS — Z90722 Acquired absence of ovaries, bilateral: Secondary | ICD-10-CM | POA: Insufficient documentation

## 2024-03-28 DIAGNOSIS — Z880 Allergy status to penicillin: Secondary | ICD-10-CM | POA: Diagnosis not present

## 2024-03-28 DIAGNOSIS — F419 Anxiety disorder, unspecified: Secondary | ICD-10-CM | POA: Diagnosis not present

## 2024-03-28 DIAGNOSIS — R0609 Other forms of dyspnea: Secondary | ICD-10-CM | POA: Insufficient documentation

## 2024-03-28 DIAGNOSIS — I10 Essential (primary) hypertension: Secondary | ICD-10-CM | POA: Insufficient documentation

## 2024-03-28 DIAGNOSIS — K449 Diaphragmatic hernia without obstruction or gangrene: Secondary | ICD-10-CM | POA: Insufficient documentation

## 2024-03-28 DIAGNOSIS — R058 Other specified cough: Secondary | ICD-10-CM | POA: Insufficient documentation

## 2024-03-28 DIAGNOSIS — Z90710 Acquired absence of both cervix and uterus: Secondary | ICD-10-CM | POA: Insufficient documentation

## 2024-03-28 DIAGNOSIS — R21 Rash and other nonspecific skin eruption: Secondary | ICD-10-CM | POA: Insufficient documentation

## 2024-03-28 DIAGNOSIS — I700 Atherosclerosis of aorta: Secondary | ICD-10-CM | POA: Insufficient documentation

## 2024-03-28 DIAGNOSIS — Z79899 Other long term (current) drug therapy: Secondary | ICD-10-CM | POA: Diagnosis not present

## 2024-03-28 DIAGNOSIS — Z7982 Long term (current) use of aspirin: Secondary | ICD-10-CM | POA: Diagnosis not present

## 2024-03-28 DIAGNOSIS — Z923 Personal history of irradiation: Secondary | ICD-10-CM | POA: Diagnosis not present

## 2024-03-28 DIAGNOSIS — E039 Hypothyroidism, unspecified: Secondary | ICD-10-CM | POA: Insufficient documentation

## 2024-03-28 DIAGNOSIS — C3432 Malignant neoplasm of lower lobe, left bronchus or lung: Secondary | ICD-10-CM | POA: Insufficient documentation

## 2024-03-28 LAB — CMP (CANCER CENTER ONLY)
ALT: 11 U/L (ref 0–44)
AST: 16 U/L (ref 15–41)
Albumin: 3.8 g/dL (ref 3.5–5.0)
Alkaline Phosphatase: 64 U/L (ref 38–126)
Anion gap: 4 — ABNORMAL LOW (ref 5–15)
BUN: 15 mg/dL (ref 8–23)
CO2: 30 mmol/L (ref 22–32)
Calcium: 8.6 mg/dL — ABNORMAL LOW (ref 8.9–10.3)
Chloride: 107 mmol/L (ref 98–111)
Creatinine: 0.88 mg/dL (ref 0.44–1.00)
GFR, Estimated: >60 mL/min (ref >60)
Glucose, Bld: 89 mg/dL (ref 70–99)
Potassium: 4.3 mmol/L (ref 3.5–5.1)
Sodium: 141 mmol/L (ref 135–145)
Total Bilirubin: 0.9 mg/dL (ref 0.0–1.2)
Total Protein: 6.7 g/dL (ref 6.5–8.1)

## 2024-03-28 LAB — CBC WITH DIFFERENTIAL (CANCER CENTER ONLY)
Abs Immature Granulocytes: 0.02 10*3/uL (ref 0.00–0.07)
Basophils Absolute: 0 10*3/uL (ref 0.0–0.1)
Basophils Relative: 1 %
Eosinophils Absolute: 0.3 10*3/uL (ref 0.0–0.5)
Eosinophils Relative: 4 %
HCT: 35.5 % — ABNORMAL LOW (ref 36.0–46.0)
Hemoglobin: 11.6 g/dL — ABNORMAL LOW (ref 12.0–15.0)
Immature Granulocytes: 0 %
Lymphocytes Relative: 20 %
Lymphs Abs: 1.2 10*3/uL (ref 0.7–4.0)
MCH: 26.4 pg (ref 26.0–34.0)
MCHC: 32.7 g/dL (ref 30.0–36.0)
MCV: 80.7 fL (ref 80.0–100.0)
Monocytes Absolute: 0.6 10*3/uL (ref 0.1–1.0)
Monocytes Relative: 9 %
Neutro Abs: 4 10*3/uL (ref 1.7–7.7)
Neutrophils Relative %: 66 %
Platelet Count: 189 10*3/uL (ref 150–400)
RBC: 4.4 MIL/uL (ref 3.87–5.11)
RDW: 14.3 % (ref 11.5–15.5)
WBC Count: 6.1 10*3/uL (ref 4.0–10.5)
nRBC: 0 % (ref 0.0–0.2)

## 2024-03-28 MED ORDER — IOHEXOL 300 MG/ML  SOLN
100.0000 mL | Freq: Once | INTRAMUSCULAR | Status: AC | PRN
Start: 2024-03-28 — End: 2024-03-28
  Administered 2024-03-28: 100 mL via INTRAVENOUS

## 2024-04-02 ENCOUNTER — Other Ambulatory Visit: Payer: Self-pay

## 2024-04-02 ENCOUNTER — Inpatient Hospital Stay (HOSPITAL_BASED_OUTPATIENT_CLINIC_OR_DEPARTMENT_OTHER): Admitting: Internal Medicine

## 2024-04-02 ENCOUNTER — Other Ambulatory Visit: Payer: Self-pay | Admitting: Pharmacy Technician

## 2024-04-02 ENCOUNTER — Other Ambulatory Visit: Payer: Self-pay | Admitting: Internal Medicine

## 2024-04-02 VITALS — BP 138/76 | HR 71 | Temp 97.4°F | Resp 18 | Ht 60.0 in | Wt 166.1 lb

## 2024-04-02 DIAGNOSIS — C3432 Malignant neoplasm of lower lobe, left bronchus or lung: Secondary | ICD-10-CM | POA: Diagnosis not present

## 2024-04-02 DIAGNOSIS — C3490 Malignant neoplasm of unspecified part of unspecified bronchus or lung: Secondary | ICD-10-CM | POA: Diagnosis not present

## 2024-04-02 MED ORDER — AFATINIB DIMALEATE 30 MG PO TABS
ORAL_TABLET | ORAL | 2 refills | Status: DC
Start: 2024-04-02 — End: 2024-06-27
  Filled 2024-04-02: qty 30, 30d supply, fill #0
  Filled 2024-04-30: qty 30, 30d supply, fill #1
  Filled 2024-05-29: qty 30, 30d supply, fill #2

## 2024-04-02 NOTE — Progress Notes (Signed)
 Specialty Pharmacy Refill Coordination Note  Elizabeth [name] is a 78 y.o. female contacted today regarding refills of specialty medication(s) Afatinib  Dimaleate (GILOTRIF )   Patient requested Delivery   Delivery date: 04/09/24   Verified address: [address]  [place]   Medication will be filled on 04/08/24.  This fill date is pending response to refill request from provider. Patient is aware and if they have not received fill by intended date they must follow up with pharmacy.

## 2024-04-02 NOTE — Progress Notes (Signed)
 [facility] [phone]   [phone]  OFFICE PROGRESS NOTE  Francenia Ingle, NP [address] 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 2) SBRT to the enlarging left upper lobe nodule under the care of Dr. Jeryl Moris.  CURRENT THERAPY: Gilotrif  30 mg by mouth daily started 02/21/2016 status post 97 months of treatment.  INTERVAL HISTORY: Elizabeth [name] 78 y.o. female returns to the clinic today for follow-up visit. Discussed the use of AI scribe software for clinical note transcription with the patient, who gave verbal consent to proceed.  History of Present Illness   Elizabeth [name] is a 78 year old female with stage four non-small cell lung cancer who presents for evaluation and repeat CT scan for restaging of her disease.  She was diagnosed with stage four non-small cell lung cancer, adenocarcinoma, with a positive EGFR mutation with exon nineteen deletion in June 2016 and has been on Gilotrif  since July 2016.  She has experienced a productive cough for the past three weeks, which she attributes to nasal drainage. She has been using Mucinex to aid expectoration and nasal sprays to reduce drainage. Hydrocodone  cough medicine has been helpful in relaxing the cough reflex. Despite these measures, she continues to have a slight cough and suspects a rib fracture due to the coughing, as it feels similar to a previous experience.  She mentions that her knees are not in good condition and inquires about the possibility of undergoing knee  replacement surgery.       MEDICAL HISTORY: Past Medical History:  Diagnosis Date   Adenocarcinoma of left lung, stage 4 (HCC) 04/05/2015   Biopsy confirmed CT SCAN: There are innumerable bilateral pulmonary nodules. These range in size from about 5 mm to 2 cm. They demonstrate irregular indistinct borders, the larger ones demonstrating spiculation. PET SCAN: Diffuse pulmonary metastatic disease with a 4 cm dominant left lower low lung lesion. No enlarged or hypermetabolic mediastinal or hilar adenopathy.   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  MEDICATIONS:  Current Outpatient Medications  Medication Sig Dispense Refill   afatinib  dimaleate (GILOTRIF ) 30 MG tablet TAKE 1 TABLET (30MG ) BY MOUTH ONCE DAILY. TAKE ON AN EMPTY STOMACH 1 HOUR BEFORE OR 2 HOURS AFTER A MEAL 30 tablet 2   ALPRAZolam  (XANAX ) 1 MG tablet Take 1 tablet (1 mg total) by mouth at bedtime. TAKE 1 TABLET BY MOUTH AT NIGHT. MAY TAKE 1/2 TABLET EXTRA AS NEEDED. 45 tablet 0   aspirin  81 MG tablet Take 81 mg by  mouth at bedtime.      CELEBREX 200 MG capsule Take 200 mg by mouth daily as needed.     ezetimibe  (ZETIA ) 10 MG tablet Take 1 tablet (10 mg total) by mouth daily. 90 tablet 3   hydrochlorothiazide  (HYDRODIURIL ) 25 MG tablet Take 1 tablet (25 mg total) by mouth daily. 30 tablet 3   HYDROcodone  bit-homatropine (HYCODAN) 5-1.5 MG/5ML syrup Take 5 mLs by mouth every 6 (six) hours as needed for cough. 120 mL 0   ibuprofen  (ADVIL ,MOTRIN ) 200 MG tablet Take  400 mg by mouth every 6 (six) hours as needed for headache or moderate pain.     levothyroxine  (SYNTHROID ) 50 MCG tablet TAKE 1 TABLET BY MOUTH EVERY DAY ON AN EMPTY STOMACH WITH ONLY WATER , WAIT AT LEAST 30 MINUTES BEFORE TAKING ANYTHING ELSE 90 tablet 3   losartan  (COZAAR ) 50 MG tablet TAKE 1 TABLET(50 MG) BY MOUTH DAILY 30 tablet 11   montelukast  (SINGULAIR ) 10 MG tablet TAKE 1 TABLET BY MOUTH FOR ALLERGIES 90 tablet 3   omeprazole (PRILOSEC) 20 MG capsule Take 20 mg by mouth daily as needed.     No current facility-administered medications for this visit.    SURGICAL HISTORY:  Past Surgical History:  Procedure Laterality Date   ABDOMINAL HYSTERECTOMY  10/23/1993   w BSO   CYST REMOVAL HAND Right    EXCISION MASS ABDOMINAL N/A 08/11/2021   Procedure: EXCISIONAL BIOPSY OF LEFT RETROPERITONEAL MASS, POSSIBLE INTRAOPERATIVE LAPAROSCOPIC ULTRASOUND;  Surgeon: Candyce Champagne, MD;  Location: [facility];  Service: General;  Laterality: N/A;   LAPAROSCOPY N/A 08/11/2021   Procedure: LAPAROSCOPIC EXPLORATION;  Surgeon: Candyce Champagne, MD;  Location: [facility];  Service: General;  Laterality: N/A;   TONSILLECTOMY AND ADENOIDECTOMY      REVIEW OF SYSTEMS:  Constitutional: negative Eyes: negative Ears, nose, mouth, throat, and face: negative Respiratory: positive for cough and dyspnea on exertion Cardiovascular: negative Gastrointestinal: negative Genitourinary:negative Integument/breast: negative Hematologic/lymphatic: negative Musculoskeletal:negative Neurological: negative Behavioral/Psych: negative Endocrine: negative Allergic/Immunologic: negative   PHYSICAL EXAMINATION: General appearance: alert, cooperative, fatigued, and no distress Head: Normocephalic, without obvious abnormality, atraumatic Neck: no adenopathy, no JVD, supple, symmetrical, trachea midline, and thyroid  not enlarged, symmetric, no tenderness/mass/nodules Lymph nodes: Cervical, supraclavicular, and axillary nodes  normal. Resp: clear to auscultation bilaterally Back: symmetric, no curvature. ROM normal. No CVA tenderness. Cardio: regular rate and rhythm, S1, S2 normal, no murmur, click, rub or gallop GI: soft, non-tender; bowel sounds normal; no masses,  no organomegaly Extremities: extremities normal, atraumatic, no cyanosis or edema Neurologic: Alert and oriented X 3, normal strength and tone. Normal symmetric reflexes. Normal coordination and gait  ECOG PERFORMANCE STATUS: 1 - Symptomatic but completely ambulatory  Blood pressure 138/76, pulse 71, temperature (!) 97.4 F (36.3 C), temperature source Tympanic, resp. rate 18, height 5' (1.524 m), weight 166 lb 1.6 oz (75.3 kg), SpO2 97%.  LABORATORY DATA: Lab Results  Component Value Date   WBC 6.1 03/28/2024   HGB 11.6 (L) 03/28/2024   HCT 35.5 (L) 03/28/2024   MCV 80.7 03/28/2024   PLT 189 03/28/2024      Chemistry      Component Value Date/Time   NA 141 03/28/2024 1046   NA 139 10/26/2017 1252   K 4.3 03/28/2024 1046   K 4.1 10/26/2017 1252   CL 107 03/28/2024 1046   CO2 30 03/28/2024 1046   CO2 28 10/26/2017 1252   BUN 15 03/28/2024 1046   BUN 15.7 10/26/2017 1252   CREATININE 0.88  03/28/2024 1046   CREATININE 1.04 (H) 10/22/2023 1218   CREATININE 1.0 10/26/2017 1252      Component Value Date/Time   CALCIUM  8.6 (L) 03/28/2024 1046   CALCIUM  8.8 10/26/2017 1252   ALKPHOS 64 03/28/2024 1046   ALKPHOS 70 10/26/2017 1252   AST 16 03/28/2024 1046   AST 19 10/26/2017 1252   ALT 11 03/28/2024 1046   ALT 22 10/26/2017 1252   BILITOT 0.9 03/28/2024 1046   BILITOT 0.90 10/26/2017 1252       RADIOGRAPHIC STUDIES: CT CHEST ABDOMEN PELVIS W CONTRAST Result Date: 03/28/2024 CLINICAL DATA:  Non-small cell lung cancer restaging * Tracking Code: BO * EXAM: CT CHEST, ABDOMEN, AND PELVIS WITH CONTRAST TECHNIQUE: Multidetector CT imaging of the chest, abdomen and pelvis was performed following the standard protocol during bolus  administration of intravenous contrast. RADIATION DOSE REDUCTION: This exam was performed according to the departmental dose-optimization program which includes automated exposure control, adjustment of the mA and/or kV according to patient size and/or use of iterative reconstruction technique. CONTRAST:  OMNIPAQUE  IOHEXOL  300 MG/ML  SOLN COMPARISON:  12/03/2023 FINDINGS: CT CHEST FINDINGS Cardiovascular: No significant vascular findings. Normal heart size. Scattered left coronary artery calcifications. No pericardial effusion. Mediastinum/Nodes: No enlarged mediastinal, hilar, or axillary lymph nodes. Small hiatal hernia. Thyroid  gland, trachea, and esophagus demonstrate no significant findings. Lungs/Pleura: Unchanged post treatment appearance of a left lower lobe mass with dense associated consolidation and radiation fibrosis measuring 4.8 x 3.0 cm (series 7, image 69). Interval enlargement of an adjacent satellite nodule measuring 0.9 x 0.8 cm, previously 0.5 cm (series 7, image 65). Additional unchanged bandlike consolidation in the left lung. Slight interval enlargement of a nodule in the right lower lobe measuring 1.5 x 1.0 cm, previously 1.2 x 0.9 cm (series 7, image 70). Unchanged irregular consolidation in the paramedian left apex measuring 2.0 x 1.4 cm (series 7, image 18). Numerous irregular and ground-glass opacities throughout the remainder of the lungs unchanged. No pleural effusion or pneumothorax. Musculoskeletal: Unchanged chronic fractures of the lateral left ribs (series 7, image 31). No acute osseous findings. CT ABDOMEN PELVIS FINDINGS Hepatobiliary: No solid liver abnormality is seen. Benign hemangioma of the posterior liver dome requiring no specific further follow-up or characterization (series 2, image 44). No gallstones, gallbladder wall thickening, or biliary dilatation. Pancreas: Unremarkable. No pancreatic ductal dilatation or surrounding inflammatory changes. Spleen: Normal in  size without significant abnormality. Adrenals/Urinary Tract: Adrenal glands are unremarkable. Kidneys are normal, without renal calculi, solid lesion, or hydronephrosis. Mild, diffuse mucosal hyperenhancement of the bladder (series 6, image 118). Stomach/Bowel: Stomach is within normal limits. Appendix appears normal. No evidence of bowel wall thickening, distention, or inflammatory changes. Vascular/Lymphatic: Aortic atherosclerosis. Unchanged right retroperitoneal lymph node measuring 1.5 x 1.0 cm (series 2, image 59) Reproductive: Status post hysterectomy. Other: No abdominal wall hernia or abnormality. No ascites. Unchanged soft tissue nodule of the posterior right perirenal fat measuring 1.9 x 1.6 cm (series 12, image 66). Musculoskeletal: No acute osseous findings. IMPRESSION: 1. Unchanged post treatment appearance of a left lower lobe mass with dense associated consolidation and radiation fibrosis. 2. There is however interval enlargement of an adjacent satellite nodule measuring 0.9 x 0.8 cm, previously 0.5 cm. 3. Slight interval enlargement of a nodule in the right lower lobe measuring 1.5 x 1.0 cm, previously 1.2 x 0.9 cm. 4. Unchanged irregular consolidation in the paramedian left apex measuring 2.0 x 1.4 cm. Numerous irregular and ground-glass opacities throughout the remainder of  the lungs unchanged. 5. Unchanged right retroperitoneal lymph node and unchanged soft tissue nodule of the posterior right perirenal fat. 6. Mild, diffuse mucosal hyperenhancement of the bladder, suggestive of cystitis. Correlate with urinalysis. 7. Coronary artery disease. Aortic Atherosclerosis (ICD10-I70.0). Electronically Signed   By: Fredricka Jenny M.D.   On: 03/28/2024 12:23      ASSESSMENT AND PLAN:  This is a very pleasant 78 years old white female with stage IV non-small cell lung cancer, adenocarcinoma with positive EGFR mutation with deletion in exon 19 and currently undergoing treatment with Gilotrif  initially  at a dose of 40 mg for 9 months followed by 30 mg by mouth daily status post 97  months.  She also underwent SBRT to the enlarging left upper lobe lung nodule. Her PET scan showed hypermetabolic subcutaneous nodule anterior to the left shoulder musculature concerning for cutaneous metastasis in addition to the enlarging nodule in the retroperitoneum anterior to the left iliacus muscle that also hypermetabolic and concerning for metastatic deposit with enlarging hypermetabolic nodule and consolidation peripherally in the left upper lobe concerning for lung cancer recurrence. She had surgical resection of the soft tissue lesion in the retroperitoneum anterior to the left iliacus muscle and this was consistent with follicular lymphoma. Her scan in September 2024 showed no concerning findings for disease progression except for enlargement of her retroperitoneal nodule posterior to the right kidney currently measuring 2.1 x 1.7 cm. 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 She has been tolerating her treatment with GILOTRIF  fairly well. She had repeat CT scan of the chest, abdomen and pelvis performed recently.  I personally and independently reviewed the scan images and discussed the result with the patient today.  Her scan showed stable disease except for slight increase on 2 of the pulmonary nodules that need close monitoring. Assessment and Plan    Stage 4 non-small cell lung cancer, adenocarcinoma Stage 4 non-small cell lung cancer, adenocarcinoma with positive EGFR mutation (exon 19 deletion), diagnosed in June 2016. Currently on Gilotrif  since July 2016. Recent CT scan shows slight increase in nodule size, indicating possible resistance due to resistant clones. Plan to continue current therapy and monitor with another scan in 3 months. Discussed potential switch to  Tagrisso if disease progression continues, emphasizing strategy to exhaust current therapy before switching. - Continue Gilotrif  - Order CT scan in 3 months - Consider switch to Tagrisso if disease progression continues - Consider radiation therapy if nodules increase in size  Cough Productive cough for three weeks, likely due to nasal drainage. Managed with Mucinex and hydrocodone  cough syrup. Cough has improved but persists slightly. - Continue Mucinex and hydrocodone  cough syrup - Provide refill of hydrocodone  cough syrup if needed  Fracture due to coughing Suspected rib fracture due to persistent coughing, similar to previous episodes. Symptoms started last night.   The patient was advised to call immediately if she has any concerning symptoms in the interval. The patient voices understanding of current disease status and treatment options and is in agreement with the current care plan. All questions were answered. The patient knows to call the clinic with any problems, questions or concerns. We can certainly see the patient much sooner if necessary. The total time spent in the appointment was 30 minutes.  Disclaimer: This note was dictated with voice recognition software. Similar sounding words can inadvertently be transcribed and may not be corrected upon  review.

## 2024-04-05 ENCOUNTER — Other Ambulatory Visit: Payer: Self-pay | Admitting: Internal Medicine

## 2024-04-07 ENCOUNTER — Other Ambulatory Visit (HOSPITAL_COMMUNITY): Payer: Self-pay

## 2024-04-07 MED ORDER — HYDROCODONE BIT-HOMATROP MBR 5-1.5 MG/5ML PO SOLN
5.0000 mL | Freq: Four times a day (QID) | ORAL | 0 refills | Status: AC | PRN
  Filled 2024-04-07: qty 120, 6d supply, fill #0

## 2024-04-08 ENCOUNTER — Other Ambulatory Visit: Payer: Self-pay

## 2024-04-11 LAB — HM MAMMOGRAPHY

## 2024-04-13 ENCOUNTER — Other Ambulatory Visit: Payer: Self-pay | Admitting: Family Medicine

## 2024-04-13 DIAGNOSIS — F419 Anxiety disorder, unspecified: Secondary | ICD-10-CM

## 2024-04-13 DIAGNOSIS — G4700 Insomnia, unspecified: Secondary | ICD-10-CM

## 2024-04-14 ENCOUNTER — Ambulatory Visit: Payer: Self-pay | Admitting: Family Medicine

## 2024-04-14 ENCOUNTER — Encounter: Payer: Self-pay | Admitting: Family Medicine

## 2024-04-17 ENCOUNTER — Ambulatory Visit: Admitting: Family Medicine

## 2024-04-17 NOTE — Progress Notes (Deleted)
 PATIENT CHECK-IN and HEALTH RISK ASSESSMENT QUESTIONNAIRE:  -completed by phone/video for upcoming Medicare Preventive Visit  -PLEASE SELECT NOT IN PERSON for the method of visit.   FIRST check to see if the patient completed the online questionnaire - if so, this can be found under the rooming tab, then go to the questionnaires tab. Some of the questions are the same and you can use the answers to complete all of the bold questions below before calling the patient. Question #s below: 6 (fall risk screening), under habits # 1, 2, 3, 8 and 9,  under everyday activities #2, 3 and 8.   Pre-Visit Check-in: 1)Vitals (height, wt, BP, etc) - record in vitals section for visit on day of visit Request home vitals (wt, BP, etc.) and enter into vitals, THEN update Vital Signs SmartPhrase below at the top of the HPI. See below.  2)Review and Update Medications, Allergies PMH, Surgeries, Social history in Epic 3)Hospitalizations in the last year with date/reason? ***  4)Review and Update Care Team (patient's specialists) in Epic 5) Complete PHQ9 in Epic  6) Complete Fall Screening in Epic 7)Review all Health Maintenance Due and order under PCP if not done.  Medicare Wellness Patient Questionnaire:  Answer theses question about your habits: How often do you have a drink containing alcohol?*** How many drinks containing alcohol do you have on a typical day when you are drinking?*** How often do you have six or more drinks on one occasion?*** Have you ever smoked?*** Quit date if applicable? ***  How many packs a day do/did you smoke? *** Do you use smokeless tobacco?*** Do you use an illicit drugs?*** On average, how many days per week do you engage in moderate to strenuous exercise (like a brisk walk)?*** On average, how many minutes do you engage in exercise at this level?*** Are you sexually active? ***Number of partners?*** Typical breakfast**** Typical lunch*** Typical dinner*** Typical  snacks:****  Beverages: ***  Answer theses question about your everyday activities: Can you perform most household chores?*** Are you deaf or have significant trouble hearing?*** Do you feel that you have a problem with memory?*** Do you feel safe at home?*** Last dentist visit?*** 8. Do you have any difficulty performing your everyday activities?*** Are you having any difficulty walking, taking medications on your own, and or difficulty managing daily home needs?*** Do you have difficulty walking or climbing stairs?*** Do you have difficulty dressing or bathing?*** Do you have difficulty doing errands alone such as visiting a doctor's office or shopping?*** Do you currently have any difficulty preparing food and eating?*** Do you currently have any difficulty using the toilet?*** Do you have any difficulty managing your finances?*** Do you have any difficulties with housekeeping of managing your housekeeping?***   Do you have Advanced Directives in place (Living Will, Healthcare Power or Attorney)? ***   Last eye Exam and location?***   Do you currently use prescribed or non-prescribed narcotic or opioid pain medications?***  Do you have a history or close family history of breast, ovarian, tubal or peritoneal cancer or a family member with BRCA (breast cancer susceptibility 1 and 2) gene mutations?  ***Request home vitals (wt, BP, etc.) and enter into vitals, THEN update Vital Signs SmartPhrase below at the top of the HPI. See below.   Nurse/Assistant Credentials/time stamp:    ----------------------------------------------------------------------------------------------------------------------------------------------------------------------------------------------------------------------  Because this visit was a virtual/telehealth visit, some criteria may be missing or patient reported. Any vitals not documented were not able to be obtained and  vitals that have been  documented are patient reported.    MEDICARE ANNUAL PREVENTIVE VISIT WITH PROVIDER: (Welcome to Medicare, initial annual wellness or annual wellness exam)  Virtual Visit via Video***Phone Note  I connected with Elizabeth [name] on 04/17/24 by phone *** a video enabled telemedicine application and verified that I am speaking with the correct person using two identifiers.  Location patient: home Location provider:work or home office Persons participating in the virtual visit: patient, provider  Concerns and/or follow up today:   See HM section in Epic for other details of completed HM.    ROS: negative for report of fevers, unintentional weight loss, vision changes, vision loss, hearing loss or change, chest pain, sob, hemoptysis, melena, hematochezia, hematuria, falls, bleeding or bruising, thoughts of suicide or self harm, memory loss  Patient-completed extensive health risk assessment - reviewed and discussed with the patient: See Health Risk Assessment completed with patient prior to the visit either above or in recent phone note. This was reviewed in detailed with the patient today and appropriate recommendations, orders and referrals were placed as needed per Summary below and patient instructions.   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 These range in size from about 5 mm to 2 cm. They demonstrate irregular indistinct borders, the larger ones demonstrating  spiculation. PET SCAN: Diffuse pulmonary metastatic disease with a 4 cm dominant left lower low lung lesion. No enlarged or hypermetabolic mediastinal or hilar adenopathy.   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   Cancer (HCC)    Change of skin related to chemotherapy 09/25/2017   Drug-induced skin rash 01/17/2016   History of radiation therapy 12/18/2019   SBRT left lung  12/11/2019-12/18/2019   Dr Lynwood Nasuti   History of radiation therapy 06/21/2021   left lung 06/14/2021-06/21/2021 Dr Lynwood Nasuti   Hyperlipidemia    Hypertension    Hypothyroidism    Insomnia    Prediabetes    Thyroid  disease     Past Surgical History:  Procedure Laterality Date   ABDOMINAL HYSTERECTOMY  10/23/1993   w BSO   CYST REMOVAL HAND Right    EXCISION MASS ABDOMINAL N/A 08/11/2021   Procedure: EXCISIONAL BIOPSY OF LEFT RETROPERITONEAL MASS, POSSIBLE INTRAOPERATIVE LAPAROSCOPIC ULTRASOUND;  Surgeon: Sheldon Standing, MD;  Location: [facility];  Service: General;  Laterality: N/A;   LAPAROSCOPY N/A 08/11/2021   Procedure: LAPAROSCOPIC EXPLORATION;  Surgeon: Sheldon Standing, MD;  Location: [facility];  Service: General;  Laterality: N/A;   TONSILLECTOMY AND ADENOIDECTOMY      Social History   Socioeconomic History   Marital status: Married    Spouse name: Not on file   Number of children: 2   Years of education: Not on file   Highest education level: Associate degree: occupational, Scientist, product/process development, or vocational program  Occupational History   Not on file  Tobacco Use   Smoking status: Former    Current packs/day: 0.00    Average packs/day: 0.5 packs/day for 10.0 years (5.0 ttl  pk-yrs)    Types: Cigarettes    Start date: 10/23/1964    Quit date: 10/23/1974    Years since quitting: 49.5   Smokeless tobacco: Never  Vaping Use   Vaping status: Never Used  Substance and Sexual Activity   Alcohol use: No   Drug use: No   Sexual activity: Not on file  Other Topics Concern   Not on file   Social History Narrative   Not on file   Social Drivers of Health   Financial Resource Strain: Low Risk  (04/14/2024)   Overall Financial Resource Strain (CARDIA)    Difficulty of Paying Living Expenses: Not hard at all  Food Insecurity: No Food Insecurity (04/14/2024)   Hunger Vital Sign    Worried About Running Out of Food in the Last Year: Never true    Ran Out of Food in the Last Year: Never true  Transportation Needs: No Transportation Needs (04/14/2024)   PRAPARE - Administrator, Civil Service (Medical): No    Lack of Transportation (Non-Medical): No  Physical Activity: Inactive (04/14/2024)   Exercise Vital Sign    Days of Exercise per Week: 0 days    Minutes of Exercise per Session: Not on file  Stress: No Stress Concern Present (04/14/2024)   [employer] of Occupational Health - Occupational Stress Questionnaire    Feeling of Stress: Only a little  Recent Concern: Stress - Stress Concern Present (01/22/2024)   [employer] of Occupational Health - Occupational Stress Questionnaire    Feeling of Stress : To some extent  Social Connections: Moderately Isolated (04/14/2024)   Social Connection and Isolation Panel    Frequency of Communication with Friends and Family: More than three times a week    Frequency of Social Gatherings with Friends and Family: More than three times a week    Attends Religious Services: Never    Database administrator or Organizations: No    Attends Engineer, structural: Not on file    Marital Status: Married  Catering manager Violence: Not At Risk (04/17/2023)   Received from [facility]   HITS    Over the last 12 months how often did your partner physically hurt you?: Never    Over the last 12 months how often did your partner insult you or talk down to you?: Never    Over the last 12 months how often did your partner threaten you with physical harm?: Never    Over the last 12 months how often did your partner scream  or curse at you?: Never    Family History  Problem Relation Age of Onset   Liver disease Mother    Alcohol abuse Mother    ALS Father    Hypertension Father    Melanoma Paternal Aunt    Dementia Paternal Aunt 57   Bone cancer Paternal Uncle    Dementia Maternal Grandmother 73   Lung disease Maternal Grandfather    Lung cancer Paternal Grandmother 53   Alcohol abuse Paternal Grandfather    Heart disease Paternal Grandfather    Breast cancer Cousin     Current Outpatient Medications on File Prior to Visit  Medication Sig Dispense Refill   afatinib  dimaleate (GILOTRIF ) 30 MG tablet TAKE 1 TABLET (30MG ) BY MOUTH ONCE DAILY. TAKE ON AN EMPTY STOMACH 1 HOUR BEFORE OR 2 HOURS AFTER A MEAL 30 tablet 2   ALPRAZolam  (XANAX ) 1 MG tablet TAKE 1 TABLET(1 MG) BY MOUTH AT BEDTIME  AND AT NIGHT. MAY TAKE 1/2 TABLET EXTRA AS NEEDED 45 tablet 0   aspirin  81 MG tablet Take 81 mg by mouth at bedtime.      CELEBREX 200 MG capsule Take 200 mg by mouth daily as needed.     ezetimibe  (ZETIA ) 10 MG tablet Take 1 tablet (10 mg total) by mouth daily. 90 tablet 3   hydrochlorothiazide  (HYDRODIURIL ) 25 MG tablet Take 1 tablet (25 mg total) by mouth daily. 30 tablet 3   HYDROcodone  bit-homatropine (HYCODAN) 5-1.5 MG/5ML syrup Take 5 mLs by mouth every 6 (six) hours as needed for cough. 120 mL 0   ibuprofen  (ADVIL ,MOTRIN ) 200 MG tablet Take 400 mg by mouth every 6 (six) hours as needed for headache or moderate pain.     levothyroxine  (SYNTHROID ) 50 MCG tablet TAKE 1 TABLET BY MOUTH EVERY DAY ON AN EMPTY STOMACH WITH ONLY WATER , WAIT AT LEAST 30 MINUTES BEFORE TAKING ANYTHING ELSE 90 tablet 3   losartan  (COZAAR ) 50 MG tablet TAKE 1 TABLET(50 MG) BY MOUTH DAILY 30 tablet 11   montelukast  (SINGULAIR ) 10 MG tablet TAKE 1 TABLET BY MOUTH FOR ALLERGIES 90 tablet 3   omeprazole (PRILOSEC) 20 MG capsule Take 20 mg by mouth daily as needed.     No current facility-administered medications on file prior to visit.     Allergies  Allergen Reactions   Penicillins Swelling and Rash       Physical Exam Vitals requested from patient and listed below if patient had equipment and was able to obtain at home for this virtual visit: There were no vitals filed for this visit. Estimated body mass index is 32.44 kg/m as calculated from the following:   Height as of 04/02/24: 5' (1.524 m).   Weight as of 04/02/24: 166 lb 1.6 oz (75.3 kg).  EKG (optional): deferred due to virtual visit  GENERAL: alert, oriented, no acute distress detected, full vision exam deferred due to pandemic and/or virtual encounter  *** HEENT: atraumatic, conjunttiva clear, no obvious abnormalities on inspection of external nose and ears  NECK: normal movements of the head and neck  LUNGS: on inspection no signs of respiratory distress, breathing rate appears normal, no obvious gross SOB, gasping or wheezing  CV: no obvious cyanosis  MS: moves all visible extremities without noticeable abnormality  PSYCH/NEURO: pleasant and cooperative, no obvious depression or anxiety, speech and thought processing grossly intact, Cognitive function grossly intact  Flowsheet Row Office Visit from [facility]/24/2024 in [facility]  PHQ-9 Total Score 4        01/24/2024   10:45 AM 04/20/2023   11:20 AM 12/20/2022   11:54 PM 12/20/2022   11:53 PM 05/18/2022   11:42 AM  Depression screen PHQ 2/9  Decreased Interest 0 0 0 0 0  Down, Depressed, Hopeless 0 0 0 0 0  PHQ - 2 Score 0 0 0 0 0  Altered sleeping 1      Tired, decreased energy 3      Change in appetite 0      Feeling bad or failure about yourself  0      Trouble concentrating 0      Moving slowly or fidgety/restless 0      Suicidal thoughts 0      PHQ-9 Score 4      Difficult doing work/chores Somewhat difficult           10/16/2022    5:22 AM 12/20/2022   11:52 PM  04/20/2023   11:19 AM 01/24/2024   10:46 AM 04/14/2024   11:11 AM  Fall Risk  Falls in  the past year?  0 0 1 0  Was there an injury with Fall?   0 0   Fall Risk Category Calculator   0 2   (RETIRED) Patient Fall Risk Level Low fall risk       Patient at Risk for Falls Due to  No Fall Risks Impaired mobility    Fall risk Follow up  Falls evaluation completed;Education provided;Falls prevention discussed Falls evaluation completed;Falls prevention discussed Falls evaluation completed      Data saved with a previous flowsheet row definition     SUMMARY AND PLAN:  No diagnosis found.  Visit coding *** [phone] (annual wellness visit -initial); G0439 (annual wellness subsequent); G0402 Welcome to Medicare(initial preventive physical exam)   Discussed applicable health maintenance/preventive health measures and advised and referred or ordered per patient preferences:  Health Maintenance  Topic Date Due   Zoster Vaccines- Shingrix (1 of 2) 01/07/1966   COVID-19 Vaccine (6 - 2024-25 season) 06/24/2023   Medicare Annual Wellness (AWV)  04/19/2024   INFLUENZA VACCINE  05/23/2024   DEXA SCAN  07/24/2024   MAMMOGRAM  04/11/2025   Pneumococcal Vaccine: 50+ Years  Completed   Hepatitis B Vaccines  Aged Out   HPV VACCINES  Aged Out   Meningococcal B Vaccine  Aged Out   DTaP/Tdap/Td  Discontinued   Colonoscopy  Discontinued   Hepatitis C Screening  Discontinued      Education and counseling on the following was provided based on the above review of health and a plan/checklist for the patient, along with additional information discussed, was provided for the patient in the patient instructions :  -Advised on importance of completing advanced directives, discussed options for completing and provided information in patient instructions as well -Provided counseling and plan for difficulty hearing  -Provided counseling and plan for increased risk of falling if applicable per above screening. Reviewed and demonstrated safe balance exercises that can be done at home to improve balance  and discussed exercise guidelines for adults with include balance exercises at least 3 days per week.  -Advised and counseled on a healthy lifestyle - including the importance of a healthy diet, regular physical activity, social connections and stress management. -Reviewed patient's current diet. Advised and counseled on a whole foods based healthy diet. A summary of a healthy diet was provided in the Patient Instructions.  -reviewed patient's current physical activity level and discussed exercise guidelines for adults. Discussed community resources and ideas for safe exercise at home to assist in meeting exercise guideline recommendations in a safe and healthy way.  -Advise yearly dental visits at minimum and regular eye exams -Advised and counseled on alcohol safe limits, risks/ tobacco use, risks of smoking and offered counseling/help, drug, opoid use/misuse   Follow up: see patient instructions     There are no Patient Instructions on file for this visit.  Chiquita JONELLE Cramp, DO

## 2024-04-24 ENCOUNTER — Ambulatory Visit: Payer: Medicare Other | Admitting: Nurse Practitioner

## 2024-04-24 ENCOUNTER — Encounter: Payer: Self-pay | Admitting: Family Medicine

## 2024-04-24 ENCOUNTER — Ambulatory Visit: Admitting: Family Medicine

## 2024-04-24 VITALS — BP 130/82 | HR 79 | Temp 98.1°F | Ht 60.0 in | Wt 164.0 lb

## 2024-04-24 DIAGNOSIS — G4700 Insomnia, unspecified: Secondary | ICD-10-CM | POA: Diagnosis not present

## 2024-04-24 DIAGNOSIS — F419 Anxiety disorder, unspecified: Secondary | ICD-10-CM

## 2024-04-24 DIAGNOSIS — I10 Essential (primary) hypertension: Secondary | ICD-10-CM

## 2024-04-24 DIAGNOSIS — R252 Cramp and spasm: Secondary | ICD-10-CM | POA: Diagnosis not present

## 2024-04-24 DIAGNOSIS — R935 Abnormal findings on diagnostic imaging of other abdominal regions, including retroperitoneum: Secondary | ICD-10-CM

## 2024-04-24 DIAGNOSIS — N3090 Cystitis, unspecified without hematuria: Secondary | ICD-10-CM

## 2024-04-24 DIAGNOSIS — E782 Mixed hyperlipidemia: Secondary | ICD-10-CM

## 2024-04-24 LAB — POCT URINALYSIS DIPSTICK
Bilirubin, UA: NEGATIVE
Blood, UA: POSITIVE
Glucose, UA: NEGATIVE
Ketones, UA: NEGATIVE
Nitrite, UA: POSITIVE
Protein, UA: NEGATIVE
Spec Grav, UA: 1.015 (ref 1.010–1.025)
Urobilinogen, UA: 0.2 U/dL
pH, UA: 6 (ref 5.0–8.0)

## 2024-04-24 MED ORDER — HYDROCHLOROTHIAZIDE 25 MG PO TABS: 12.5000 mg | ORAL_TABLET | Freq: Every day | ORAL | Status: DC

## 2024-04-24 MED ORDER — CEPHALEXIN 500 MG PO CAPS: 500.0000 mg | ORAL_CAPSULE | Freq: Three times a day (TID) | ORAL | 0 refills | Status: AC

## 2024-04-24 NOTE — Progress Notes (Signed)
 Established Patient Office Visit   Subjective:  Patient ID: Elizabeth [name], female    DOB: 05-20-1946  Age: 78 y.o. MRN: [id number]  Chief Complaint  Patient presents with   Medical Management of Chronic Issues    3 month follow up     HPI HTN: Chronic. Patient is taking Losartan  50mg  daily. Patient has stopped taking HCTZ 25mg  right after last appointment due to muscle cramps in legs. Denies monitoring her blood pressure at home. Denies CP, dizziness, lightheadedness, or lower extremity edema.  SHOB on exertion, but not at rest. Since not taking HCTZ, lower extremity edema has not present.  BP Readings from Last 3 Encounters:  04/24/24 130/82  04/02/24 138/76  02/07/24 123/75     Hyperlipidemia: Chronic. Patient is taking Zetia  10mg  daily. Never been on a statin for cholesterol.  Lab Results  Component Value Date   CHOL 147 01/24/2024   HDL 49.20 01/24/2024   LDLCALC 81 01/24/2024   TRIG 83.0 01/24/2024   CHOLHDL 3 01/24/2024    Hypothyroidism: Chronic. Patient is taking Levothyroxine  50mcg daily. Not changed dose in a long time.  Lab Results  Component Value Date   TSH 2.70 01/24/2024    Seasonal allergies: Chronic. Patient is taking Montelukast  10mg  daily. Effective.    Insomnia/Anxiety: Patient is taking Alprazolam  1mg  every night for insomnia and anxiety. Sometimes she will take an additional 1/2 tablet to help her sleep. Effective. Takes medication everynight.    Patient had a CT abd/pelvis scan with Dr. Sherrod. Based on the scan, there was some mild, diffuse mucosal hyper enhancement of the bladder, suggestive of cystitis. Recommend to correlate with urinalysis.  ROS See HPI above     Objective:   BP 130/82   Pulse 79   Temp 98.1 F (36.7 C) (Oral)   Ht 5' (1.524 m)   Wt 164 lb (74.4 kg)   SpO2 96%   BMI 32.03 kg/m    Physical Exam Vitals reviewed.  Constitutional:      General: She is not in acute distress.    Appearance: Normal appearance. She is  obese. She is not ill-appearing, toxic-appearing or diaphoretic.  HENT:     Head: Normocephalic and atraumatic.  Eyes:     General:        Right eye: No discharge.        Left eye: No discharge.     Conjunctiva/sclera: Conjunctivae normal.  Cardiovascular:     Rate and Rhythm: Normal rate and regular rhythm.     Heart sounds: Normal heart sounds. No murmur heard.    No friction rub. No gallop.  Pulmonary:     Effort: Pulmonary effort is normal. No respiratory distress.     Breath sounds: Normal breath sounds.  Musculoskeletal:        General: Normal range of motion.  Skin:    General: Skin is warm and dry.  Neurological:     General: No focal deficit present.     Mental Status: She is alert and oriented to person, place, and time. Mental status is at baseline.  Psychiatric:        Mood and Affect: Mood normal.        Behavior: Behavior normal.        Thought Content: Thought content normal.        Judgment: Judgment normal.     Latest Reference Range & Units 04/24/24 14:38  Appearance  Pend  Bilirubin, UA  neg  Clarity, UA  cloudy  Color, UA  yellow  Glucose Negative  Negative  Ketones, UA  neg  Leukocytes,UA Negative  Large (3+) !  Nitrite, UA  positive  pH, UA 5.0 - 8.0  6.0  Protein,UA Negative  Negative  Specific Gravity, UA 1.010 - 1.025  1.015  Urobilinogen, UA 0.2 or 1.0 E.U./dL 0.2  RBC, UA  pos  !: Data is abnormal    Assessment & Plan:  Abnormal CT of the abdomen -     POCT urinalysis dipstick  Anxiety Assessment & Plan: Stable. Continue Alprazolam  1mg  every night for insomnia and anxiety. UDS ordered and controlled substance agreement is UTD.   Orders: -     DRUG MONITOR, PANEL 1, SCREEN, URINE  Insomnia, unspecified type Assessment & Plan: Stable. Continue Alprazolam  1mg  every night for insomnia and anxiety. UDS ordered and controlled substance agreement is UTD.   Orders: -     DRUG MONITOR, PANEL 1, SCREEN, URINE  Muscle cramps -      Magnesium  -     Comprehensive metabolic panel with GFR  Essential hypertension Assessment & Plan: Stable. Continue with Losartan  50mg  daily. Decreased Hydrochlorothiazide  to 12.5mg  instead of 25mg  daily to see if this is related to muscle cramps. Ordered CMP and magnesium  to assess for kidney function and reason for muscle cramps.   Orders: -     hydroCHLOROthiazide ; Take 0.5 tablets (12.5 mg total) by mouth daily. -     Comprehensive metabolic panel with GFR  Cystitis -     Cephalexin ; Take 1 capsule (500 mg total) by mouth 3 (three) times daily for 7 days.  Dispense: 21 capsule; Refill: 0 -     Urine Culture  Hyperlipidemia, mixed Assessment & Plan: Stable. Continue taking Zetia  10mg . Didn't do lipid due to not fasting for appointment. Will do at next appointment.      Other orders -     DM TEMPLATE  -Urinalysis completed due to abnormal findings on CT scan. Urinalysis was positive for leukocytes (white blood cells), nitrate positive, and blood. Signs of infection. Prescribed Keflex  500mg  tablet, take 1 tablet every 8 hours for 5 days. Will send urine for culture.   Return in about 3 months (around 07/25/2024) for physical.   Harutyun Monteverde, NP

## 2024-04-26 LAB — COMPREHENSIVE METABOLIC PANEL WITH GFR
AG Ratio: 1.5 (calc) (ref 1.0–2.5)
ALT: 33 U/L — ABNORMAL HIGH (ref 6–29)
AST: 19 U/L (ref 10–35)
Albumin: 4 g/dL (ref 3.6–5.1)
Alkaline phosphatase (APISO): 115 U/L (ref 37–153)
BUN: 22 mg/dL (ref 7–25)
CO2: 26 mmol/L (ref 20–32)
Calcium: 8.8 mg/dL (ref 8.6–10.4)
Chloride: 105 mmol/L (ref 98–110)
Creat: 0.92 mg/dL (ref 0.60–1.00)
Globulin: 2.6 g/dL (ref 1.9–3.7)
Glucose, Bld: 81 mg/dL (ref 65–99)
Potassium: 4.3 mmol/L (ref 3.5–5.3)
Sodium: 140 mmol/L (ref 135–146)
Total Bilirubin: 0.9 mg/dL (ref 0.2–1.2)
Total Protein: 6.6 g/dL (ref 6.1–8.1)
eGFR: 64 mL/min/1.73m2 (ref >=60)

## 2024-04-26 LAB — URINE CULTURE
MICRO NUMBER:: 16657772
SPECIMEN QUALITY:: ADEQUATE

## 2024-04-26 LAB — DRUG MONITOR, PANEL 1, SCREEN, URINE
Amphetamines: NEGATIVE ng/mL (ref <500)
Barbiturates: NEGATIVE ng/mL (ref <300)
Benzodiazepines: POSITIVE ng/mL — AB (ref <100)
Cocaine Metabolite: NEGATIVE ng/mL (ref <150)
Creatinine: 86.7 mg/dL (ref >=20.0)
Marijuana Metabolite: NEGATIVE ng/mL (ref <20)
Methadone Metabolite: NEGATIVE ng/mL (ref <100)
Opiates: NEGATIVE ng/mL (ref <100)
Oxidant: NEGATIVE ug/mL (ref <200)
Oxycodone: NEGATIVE ng/mL (ref <100)
Phencyclidine: NEGATIVE ng/mL (ref <25)
pH: 6.8 (ref 4.5–9.0)

## 2024-04-26 LAB — DM TEMPLATE

## 2024-04-26 LAB — MAGNESIUM: Magnesium: 1.9 mg/dL (ref 1.5–2.5)

## 2024-04-28 ENCOUNTER — Ambulatory Visit: Payer: Self-pay | Admitting: Family Medicine

## 2024-04-29 NOTE — Assessment & Plan Note (Addendum)
 Stable. Continue with Losartan  50mg  daily. Decreased Hydrochlorothiazide  to 12.5mg  instead of 25mg  daily to see if this is related to muscle cramps. Ordered CMP and magnesium  to assess for kidney function and reason for muscle cramps.

## 2024-04-29 NOTE — Patient Instructions (Addendum)
-  It was great to see you. -Urinalysis completed due to abnormal findings on CT scan. Urinalysis was positive for leukocytes (white blood cells), nitrate positive, and blood. Signs of infection. Prescribed Keflex  500mg  tablet, take 1 tablet every 8 hours for 5 days. Will send urine for culture.  - Continue with Losartan  50mg  daily. Decreased Hydrochlorothiazide  to 12.5mg  instead of 25mg  daily to see if this is related to muscle cramps. -Continue all medications.  -Urine drug screen obtained for management of Alprazolam  for anxiety and depression. -Follow up in 3 months for a physical and please be fasting.

## 2024-04-29 NOTE — Assessment & Plan Note (Addendum)
 Stable. Continue taking Zetia  10mg . Didn't do lipid due to not fasting for appointment. Will do at next appointment.

## 2024-04-29 NOTE — Assessment & Plan Note (Addendum)
 Stable. Continue Alprazolam  1mg  every night for insomnia and anxiety. UDS ordered and controlled substance agreement is UTD.

## 2024-04-29 NOTE — Assessment & Plan Note (Signed)
 Stable. Continue Alprazolam  1mg  every night for insomnia and anxiety. UDS ordered and controlled substance agreement is UTD.

## 2024-04-30 ENCOUNTER — Other Ambulatory Visit: Payer: Self-pay

## 2024-04-30 ENCOUNTER — Other Ambulatory Visit: Payer: Self-pay | Admitting: Pharmacy Technician

## 2024-04-30 ENCOUNTER — Encounter (INDEPENDENT_AMBULATORY_CARE_PROVIDER_SITE_OTHER): Payer: Self-pay

## 2024-04-30 NOTE — Progress Notes (Signed)
 Specialty Pharmacy Refill Coordination Note  Elizabeth [name] is a 78 y.o. female contacted today regarding refills of specialty medication(s) Afatinib  Dimaleate (GILOTRIF )   Patient requested (Patient-Rptd) Delivery   Delivery date: 05/08/24 Verified address: (Patient-Rptd) [address] [place] [address]   Medication will be filled on 05/07/24.

## 2024-05-07 ENCOUNTER — Other Ambulatory Visit: Payer: Self-pay

## 2024-05-13 ENCOUNTER — Other Ambulatory Visit: Payer: Self-pay

## 2024-05-13 ENCOUNTER — Encounter: Payer: Self-pay | Admitting: Family Medicine

## 2024-05-13 MED ORDER — LEVOTHYROXINE SODIUM 50 MCG PO TABS: 50.0000 ug | ORAL_TABLET | Freq: Every day | ORAL | 3 refills | Status: AC

## 2024-05-17 ENCOUNTER — Other Ambulatory Visit: Payer: Self-pay | Admitting: Family Medicine

## 2024-05-17 DIAGNOSIS — F419 Anxiety disorder, unspecified: Secondary | ICD-10-CM

## 2024-05-17 DIAGNOSIS — G4700 Insomnia, unspecified: Secondary | ICD-10-CM

## 2024-05-27 ENCOUNTER — Ambulatory Visit: Admitting: Family Medicine

## 2024-05-28 ENCOUNTER — Encounter (INDEPENDENT_AMBULATORY_CARE_PROVIDER_SITE_OTHER): Payer: Self-pay

## 2024-05-29 ENCOUNTER — Ambulatory Visit: Admitting: Family Medicine

## 2024-05-29 ENCOUNTER — Other Ambulatory Visit: Payer: Self-pay | Admitting: Pharmacy Technician

## 2024-05-29 ENCOUNTER — Other Ambulatory Visit: Payer: Self-pay

## 2024-05-29 NOTE — Progress Notes (Signed)
 Specialty Pharmacy Refill Coordination Note  KEIR FOLAND is a 78 y.o. female contacted today regarding refills of specialty medication(s) Afatinib  Dimaleate (GILOTRIF )   Patient requested (Patient-Rptd) Delivery   Delivery date: 06/05/24 Verified address: (Patient-Rptd) [address] [place] [address]   Medication will be filled on 06/04/24.

## 2024-06-08 ENCOUNTER — Emergency Department (HOSPITAL_BASED_OUTPATIENT_CLINIC_OR_DEPARTMENT_OTHER)

## 2024-06-08 ENCOUNTER — Encounter (HOSPITAL_BASED_OUTPATIENT_CLINIC_OR_DEPARTMENT_OTHER): Payer: Self-pay | Admitting: *Deleted

## 2024-06-08 ENCOUNTER — Other Ambulatory Visit: Payer: Self-pay

## 2024-06-08 ENCOUNTER — Emergency Department (HOSPITAL_BASED_OUTPATIENT_CLINIC_OR_DEPARTMENT_OTHER)
Admission: EM | Admit: 2024-06-08 | Discharge: 2024-06-08 | Disposition: A | Discharge status: Home / Self Care | Attending: Emergency Medicine | Admitting: Emergency Medicine

## 2024-06-08 DIAGNOSIS — Z7982 Long term (current) use of aspirin: Secondary | ICD-10-CM | POA: Diagnosis not present

## 2024-06-08 DIAGNOSIS — T7840XA Allergy, unspecified, initial encounter: Secondary | ICD-10-CM | POA: Diagnosis present

## 2024-06-08 DIAGNOSIS — I10 Essential (primary) hypertension: Secondary | ICD-10-CM | POA: Diagnosis not present

## 2024-06-08 DIAGNOSIS — R0602 Shortness of breath: Secondary | ICD-10-CM | POA: Insufficient documentation

## 2024-06-08 DIAGNOSIS — Z85118 Personal history of other malignant neoplasm of bronchus and lung: Secondary | ICD-10-CM | POA: Diagnosis not present

## 2024-06-08 LAB — CBC WITH DIFFERENTIAL/PLATELET
Abs Immature Granulocytes: 0.02 K/uL (ref 0.00–0.07)
Basophils Absolute: 0 K/uL (ref 0.0–0.1)
Basophils Relative: 0 %
Eosinophils Absolute: 0.1 K/uL (ref 0.0–0.5)
Eosinophils Relative: 1 %
HCT: 39.8 % (ref 36.0–46.0)
Hemoglobin: 13.2 g/dL (ref 12.0–15.0)
Immature Granulocytes: 0 %
Lymphocytes Relative: 30 %
Lymphs Abs: 2.5 K/uL (ref 0.7–4.0)
MCH: 28 pg (ref 26.0–34.0)
MCHC: 33.2 g/dL (ref 30.0–36.0)
MCV: 84.5 fL (ref 80.0–100.0)
Monocytes Absolute: 0.7 K/uL (ref 0.1–1.0)
Monocytes Relative: 8 %
Neutro Abs: 4.9 K/uL (ref 1.7–7.7)
Neutrophils Relative %: 61 %
Platelets: 247 K/uL (ref 150–400)
RBC: 4.71 MIL/uL (ref 3.87–5.11)
RDW: 13.7 % (ref 11.5–15.5)
WBC: 8.2 K/uL (ref 4.0–10.5)
nRBC: 0 % (ref 0.0–0.2)

## 2024-06-08 LAB — TROPONIN T, HIGH SENSITIVITY: Troponin T High Sensitivity: <15 ng/L (ref 0–19)

## 2024-06-08 LAB — BASIC METABOLIC PANEL WITH GFR
Anion gap: 16 — ABNORMAL HIGH (ref 5–15)
BUN: 25 mg/dL — ABNORMAL HIGH (ref 8–23)
CO2: 20 mmol/L — ABNORMAL LOW (ref 22–32)
Calcium: 9.1 mg/dL (ref 8.9–10.3)
Chloride: 101 mmol/L (ref 98–111)
Creatinine, Ser: 1.12 mg/dL — ABNORMAL HIGH (ref 0.44–1.00)
GFR, Estimated: 50 mL/min — ABNORMAL LOW (ref >60)
Glucose, Bld: 107 mg/dL — ABNORMAL HIGH (ref 70–99)
Potassium: 3.5 mmol/L (ref 3.5–5.1)
Sodium: 137 mmol/L (ref 135–145)

## 2024-06-08 LAB — D-DIMER, QUANTITATIVE: D-Dimer, Quant: 2.49 ug{FEU}/mL — ABNORMAL HIGH (ref 0.00–0.50)

## 2024-06-08 MED ORDER — IPRATROPIUM-ALBUTEROL 0.5-2.5 (3) MG/3ML IN SOLN
3.0000 mL | Freq: Once | RESPIRATORY_TRACT | Status: AC
  Administered 2024-06-08: 3 mL via RESPIRATORY_TRACT
  Filled 2024-06-08: qty 3

## 2024-06-08 MED ORDER — FAMOTIDINE IN NACL 20-0.9 MG/50ML-% IV SOLN
20.0000 mg | Freq: Once | INTRAVENOUS | Status: AC
  Administered 2024-06-08: 20 mg via INTRAVENOUS
  Filled 2024-06-08: qty 50

## 2024-06-08 MED ORDER — EPINEPHRINE 0.3 MG/0.3ML IJ SOAJ: 0.3000 mg | INTRAMUSCULAR | 0 refills | Status: AC | PRN

## 2024-06-08 MED ORDER — METHYLPREDNISOLONE SODIUM SUCC 125 MG IJ SOLR
125.0000 mg | Freq: Once | INTRAMUSCULAR | Status: AC
  Administered 2024-06-08: 125 mg via INTRAVENOUS
  Filled 2024-06-08: qty 2

## 2024-06-08 MED ORDER — IOHEXOL 350 MG/ML SOLN
75.0000 mL | Freq: Once | INTRAVENOUS | Status: AC | PRN
  Administered 2024-06-08: 75 mL via INTRAVENOUS

## 2024-06-08 MED ORDER — PREDNISONE 10 MG PO TABS: 20.0000 mg | ORAL_TABLET | Freq: Every day | ORAL | 0 refills | Status: AC

## 2024-06-08 MED ORDER — EPINEPHRINE 0.3 MG/0.3ML IJ SOAJ
INTRAMUSCULAR | Status: AC
  Filled 2024-06-08: qty 0.3

## 2024-06-08 NOTE — ED Notes (Signed)
 Dr. Ruthe in to reassess pt.

## 2024-06-08 NOTE — Discharge Instructions (Addendum)
 Overall I have given you a prescription for an EpiPen  to use if you have a severe allergic reaction as we discussed.  I given you a prescription for prednisone  to take the next couple days to help prevent any further symptoms.  Take Benadryl  25 mg every 6-8 hours as needed as well for itching.  Follow-up with allergist.  Follow-up with your primary care doctor.  Return if symptoms worsen.

## 2024-06-08 NOTE — ED Notes (Signed)
To CT with this RN

## 2024-06-08 NOTE — ED Notes (Signed)
 Awaiting CT scan.

## 2024-06-08 NOTE — ED Notes (Signed)
 Ginger ale given at d/c home. Voiced understanding of d/c instructions.

## 2024-06-08 NOTE — ED Notes (Addendum)
 Returned from CT. This RN went with pt

## 2024-06-08 NOTE — ED Provider Notes (Signed)
 [place] EMERGENCY DEPARTMENT AT [facility] Provider Note   CSN: [id number] Arrival date & time: 06/08/24  2004     Patient presents with: Allergic Reaction   Elizabeth [name] is a 78 y.o. female.   Patient here shortness of breath and concern for for allergic reaction.  She ate some shrimp shortly afterwards started having some tingling in her throat trouble breathing took Benadryl  prior to coming here feels little bit better.  But she reports shortness of breath chest tightness and nausea.  She did not develop any rash or swelling.  She is not having any swelling of her lips or tongue.  She has history of hypertension high cholesterol lung cancer on oral maintenance meds.  Overall patient states that when she has had shrimp in the past she had tingling similar to this.  So she is avoided it for a long time.  Has never needed EpiPen  before.  The history is provided by the patient.       Prior to Admission medications   Medication Sig Start Date End Date Taking? Authorizing Provider  EPINEPHrine  0.3 mg/0.3 mL IJ SOAJ injection Inject 0.3 mg into the muscle as needed for anaphylaxis. 06/08/24  Yes Ranen Doolin, DO  predniSONE  (DELTASONE ) 10 MG tablet Take 2 tablets (20 mg total) by mouth daily for 3 days. 06/08/24 06/11/24 Yes Ashwini Jago, DO  afatinib  dimaleate (GILOTRIF ) 30 MG tablet TAKE 1 TABLET (30MG ) BY MOUTH ONCE DAILY. TAKE ON AN EMPTY STOMACH 1 HOUR BEFORE OR 2 HOURS AFTER A MEAL 04/02/24 04/02/25  Sherrod Sherrod, MD  ALPRAZolam  (XANAX ) 1 MG tablet TAKE 1 TABLET BY MOUTH AT BEDTIME. MAY TAKE 1/2 TABLET EXTRA DAILY AS NEEDED 05/18/24   Webb, Padonda B, FNP  aspirin  81 MG tablet Take 81 mg by mouth at bedtime.     [provider]  ezetimibe  (ZETIA ) 10 MG tablet Take 1 tablet (10 mg total) by mouth daily. 06/26/23   Wilkinson, Dana E, NP  hydrochlorothiazide  (HYDRODIURIL ) 25 MG tablet Take 0.5 tablets (12.5 mg total) by mouth daily. 04/24/24   Billy Philippe SAUNDERS,  NP  HYDROcodone  bit-homatropine [facility]) 5-1.5 MG/5ML syrup Take 5 mLs by mouth every 6 (six) hours as needed for cough. 04/07/24   Sherrod Sherrod, MD  ibuprofen  (ADVIL ,MOTRIN ) 200 MG tablet Take 400 mg by mouth every 6 (six) hours as needed for headache or moderate pain.    [provider]  levothyroxine  (SYNTHROID ) 50 MCG tablet Take 1 tablet (50 mcg total) by mouth daily. 05/13/24   Billy Philippe SAUNDERS, NP  losartan  (COZAAR ) 50 MG tablet TAKE 1 TABLET(50 MG) BY MOUTH DAILY 10/01/23   Wilkinson, Dana E, NP  montelukast  (SINGULAIR ) 10 MG tablet TAKE 1 TABLET BY MOUTH FOR ALLERGIES 07/02/23   Wilkinson, Dana E, NP  omeprazole (PRILOSEC) 20 MG capsule Take 20 mg by mouth daily as needed.    [provider]    Allergies: Shellfish allergy and Penicillins    Review of Systems  Updated Vital Signs BP 125/60   Pulse 75   Temp 97.9 F (36.6 C) (Oral)   Resp 15   SpO2 98%   Physical Exam Vitals and nursing note reviewed.  Constitutional:      General: She is in acute distress.     Appearance: She is well-developed. She is not ill-appearing.  HENT:     Head: Normocephalic and atraumatic.     Nose: Nose normal.     Mouth/Throat:     Mouth:  Mucous membranes are moist.     Comments: No swelling of the tongue or lips Eyes:     Extraocular Movements: Extraocular movements intact.     Conjunctiva/sclera: Conjunctivae normal.     Pupils: Pupils are equal, round, and reactive to light.  Cardiovascular:     Rate and Rhythm: Normal rate and regular rhythm.     Pulses: Normal pulses.     Heart sounds: Normal heart sounds. No murmur heard. Pulmonary:     Effort: Pulmonary effort is normal. No respiratory distress.     Breath sounds: Normal breath sounds.  Abdominal:     Palpations: Abdomen is soft.     Tenderness: There is no abdominal tenderness.  Musculoskeletal:        General: No swelling.     Cervical back: Neck supple.  Skin:    General: Skin is warm and dry.      Capillary Refill: Capillary refill takes less than 2 seconds.  Neurological:     General: No focal deficit present.     Mental Status: She is alert and oriented to person, place, and time.  Psychiatric:        Mood and Affect: Mood normal.     (all labs ordered are listed, but only abnormal results are displayed) Labs Reviewed  BASIC METABOLIC PANEL WITH GFR - Abnormal; Notable for the following components:      Result Value   CO2 20 (*)    Glucose, Bld 107 (*)    BUN 25 (*)    Creatinine, Ser 1.12 (*)    GFR, Estimated 50 (*)    Anion gap 16 (*)    All other components within normal limits  D-DIMER, QUANTITATIVE (NOT AT [facility]) - Abnormal; Notable for the following components:   D-Dimer, Quant 2.49 (*)    All other components within normal limits  CBC WITH DIFFERENTIAL/PLATELET  TROPONIN T, HIGH SENSITIVITY    EKG: EKG Interpretation Date/Time:  Sunday June 08 2024 20:20:12 EDT Ventricular Rate:  103 PR Interval:  147 QRS Duration:  104 QT Interval:  364 QTC Calculation: 477 R Axis:   53  Text Interpretation: Sinus tachycardia Confirmed by Ruthe Cornet [phone]) on 06/08/2024 8:26:10 PM  Radiology: CT Angio Chest PE W and/or Wo Contrast Result Date: 06/08/2024 CLINICAL DATA:  Shortness of breath.  PE suspected. EXAM: CT ANGIOGRAPHY CHEST WITH CONTRAST TECHNIQUE: Multidetector CT imaging of the chest was performed using the standard protocol during bolus administration of intravenous contrast. Multiplanar CT image reconstructions and MIPs were obtained to evaluate the vascular anatomy. RADIATION DOSE REDUCTION: This exam was performed according to the departmental dose-optimization program which includes automated exposure control, adjustment of the mA and/or kV according to patient size and/or use of iterative reconstruction technique. CONTRAST:  75mL OMNIPAQUE  IOHEXOL  350 MG/ML SOLN COMPARISON:  Same day chest radiograph and CT 03/28/2024 FINDINGS: Cardiovascular: Normal  caliber thoracic aorta. Mild aortic atherosclerotic calcification. No pericardial effusion. Mild coronary artery calcification. Negative for pulmonary embolism. Mediastinum/Nodes: Trachea and esophagus are unremarkable. Increased size of a subcarinal lymph node now measuring 7 mm in short axis previously 4 mm (series 5, image 60). Lungs/Pleura: Increased size of the consolidation in the left lower lobe measuring 6.3 x 3.3 cm in the axial plane (7/62), previously 4.8 x 3.0 cm on 03/28/2024. This is concerning for residual or recurrent malignancy. There is increased size of the satellite nodule now measuring 10 x 11 mm (7/60), previously 9 x 8 mm. The nodule in  the right lower lobe has also increased in size now measuring 16 x 12 mm (7/66), previously 15 x 10 mm. Subpleural nodularity in the left apex is unchanged and likely represents pleuroparenchymal scarring. There is new diffuse bronchial wall thickening and mosaic attenuation/air trapping suggesting airway infection/inflammation. No pleural effusion or pneumothorax. Upper Abdomen: No acute abnormality. Hypoattenuation in the posterior right hepatic lobe corresponds to a hemangioma. Musculoskeletal: No acute fracture or destructive osseous lesion. Unchanged chronic fractures of the lateral left ribs. Review of the MIP images confirms the above findings. IMPRESSION: 1. Negative for pulmonary embolism. 2. Findings concerning for progressive malignancy including increased size of the consolidation and satellite nodularity in the left lung and increased size of the nodule in the right lower lobe. 3. Bronchial wall thickening with scattered centrilobular micro nodules and air trapping compatible with small airway infection/inflammation. 4. Increased size of a subcarinal lymph node now measuring 7 mm in short axis previously 4 mm. Continued attention on follow-up. 5. Aortic Atherosclerosis (ICD10-I70.0). Electronically Signed   By: Norman Gatlin M.D.   On: 06/08/2024  22:56   DG Chest Portable 1 View Result Date: 06/08/2024 CLINICAL DATA:  Shortness of breath EXAM: PORTABLE CHEST 1 VIEW COMPARISON:  07/05/2021.  CT 03/28/2024 FINDINGS: Heart and mediastinal contours are within normal limits. Platelike density in the left upper lobe is stable since recent CT. No acute confluent airspace opacities or effusions. No acute bony abnormality. Chronic left rib fractures again noted. IMPRESSION: No acute cardiopulmonary disease. Electronically Signed   By: Franky Crease M.D.   On: 06/08/2024 21:27     Procedures   Medications Ordered in the ED  ipratropium-albuterol  (DUONEB) 0.5-2.5 (3) MG/3ML nebulizer solution 3 mL (3 mLs Nebulization Given 06/08/24 2030)  methylPREDNISolone  sodium succinate (SOLU-MEDROL ) 125 mg/2 mL injection 125 mg (125 mg Intravenous Given 06/08/24 2028)  famotidine  (PEPCID ) IVPB 20 mg premix (0 mg Intravenous Stopped 06/08/24 2114)  iohexol  (OMNIPAQUE ) 350 MG/ML injection 75 mL (75 mLs Intravenous Contrast Given 06/08/24 2228)                                    Medical Decision Making Amount and/or Complexity of Data Reviewed Labs: ordered. Radiology: ordered.  Risk Prescription drug management.   Gaetana LOISE Blush is here with shortness of breath concern for allergic reaction.  History of lung cancer.  History of hypertension high cholesterol.  She is on maintenance medications for her cancer.  She had some intolerance to shrimp in the past she usually avoids it but she decided to have some shrimp oil tonight.  She started to have some tingling throat closing off feeling shortly after eating this.  She is feeling lightheaded dizzy.  She somewhat syncopized in triage room.  Blood pressure was low but quickly responded.  She had already taken Benadryl .  She did not have any obvious tongue or lip swelling.  No wheezing.  No GI symptoms.  Overall her husband was with her and did not really notice any allergy symptoms himself other than she states  that she was feeling really itchy.  Given her history I decided to evaluate for pulmonary issues including PE pneumonia pneumothorax ACS basic labs.  I gave her Solu-Medrol  Pepcid  breathing treatment.  She did not seem to be having any symptoms consistent with anaphylaxis EpiPen  was helped.  D-dimer was elevated therefore pursued a CT scan of her chest.  Chest x-ray  showed no evidence of pneumonia or pneumothorax.  Per my further review interpretation of labs no significant leukocytosis anemia or electrolyte abnormality.  Troponin normal.  She was given IV fluids.  She was observed in the ED for several hours.  She felt much better.  Her hemodynamics were normal.  There is no other further evidence of any allergic reaction.  CT scan is pending.  CT scan showed no PE.  Overall CT scan does show some progression of malignancy including increased size of consolidation and satellite nodularity of the left lung.  May be some increased size and subcarinal lymph nodes increase as well.  She is made aware of this.  She will talk with her oncologist about this.  Otherwise she is feeling better.  Seems back to her baseline.  Will prescribe prednisone  for home prescribed EpiPen .  Will follow-up with allergist primary care.  Discharged in good condition.  This chart was dictated using voice recognition software.  Despite best efforts to proofread,  errors can occur which can change the documentation meaning.      Final diagnoses:  Allergic reaction, initial encounter  SOB (shortness of breath)    ED Discharge Orders          Ordered    predniSONE  (DELTASONE ) 10 MG tablet  Daily        06/08/24 2239    EPINEPHrine  0.3 mg/0.3 mL IJ SOAJ injection  As needed        06/08/24 2239               Ruthe Cornet, DO 06/08/24 2302

## 2024-06-08 NOTE — ED Triage Notes (Signed)
 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 No hx of Epipen  prescription. Benadryl  taken 30 minutes ago with ingestion 45 minutes ago.   Patient reports shortness of breath, chest tightness and nausea. No obvious swelling, rash or airway swelling.

## 2024-06-08 NOTE — ED Notes (Addendum)
 Pt was taken directly to the resus room and evaluated by Carlo, [place] and Dr. Ruthe. Pt felt like she may pass out states vision was black Lifted patient into bed. Pt felt better after laying down and states her vision returned. Pt. Feels like her lips were swollen and she was itching all over after eating shrimp.No obvious rash noted at present. Lips do not appear swollen at present. Pt did take Benadryl  25mg  pta. B/p 94/57. 02 sats were 100% on RA. Denies any cp/sob. No distress. Pt was hyperventilating initially, but improved after getting to room.

## 2024-06-08 NOTE — ED Notes (Signed)
 Pt came in with an allergic reaction to shell fish. SATS 98% RR 20. HR 80. Pt taken to resus room and given a duo neb by this RT, per MD.

## 2024-06-09 ENCOUNTER — Encounter: Payer: Self-pay | Admitting: Family Medicine

## 2024-06-09 ENCOUNTER — Encounter: Payer: Self-pay | Admitting: Internal Medicine

## 2024-06-09 ENCOUNTER — Telehealth: Payer: Self-pay

## 2024-06-09 NOTE — Transitions of Care (Post Inpatient/ED Visit) (Signed)
   06/09/2024  Name: ADELI FROST MRN: [id number] DOB: 04-22-1946  Today's TOC FU Call Status:    Attempted to reach the patient regarding the most recent Inpatient/ED visit.  Follow Up Plan: Additional outreach attempts will be made to reach the patient to complete the Transitions of Care (Post Inpatient/ED visit) call.   Signature: Marquette Piontek, CMA

## 2024-06-16 ENCOUNTER — Other Ambulatory Visit: Payer: Self-pay

## 2024-06-16 ENCOUNTER — Encounter: Payer: Self-pay | Admitting: Family Medicine

## 2024-06-16 MED ORDER — MONTELUKAST SODIUM 10 MG PO TABS: ORAL_TABLET | ORAL | 3 refills | Status: AC

## 2024-06-16 MED ORDER — EZETIMIBE 10 MG PO TABS: 10.0000 mg | ORAL_TABLET | Freq: Every day | ORAL | 3 refills | Status: AC

## 2024-06-18 ENCOUNTER — Other Ambulatory Visit: Payer: Self-pay | Admitting: Family

## 2024-06-18 DIAGNOSIS — F419 Anxiety disorder, unspecified: Secondary | ICD-10-CM

## 2024-06-18 DIAGNOSIS — G4700 Insomnia, unspecified: Secondary | ICD-10-CM

## 2024-06-20 ENCOUNTER — Other Ambulatory Visit: Payer: Self-pay | Admitting: Family Medicine

## 2024-06-20 DIAGNOSIS — G4700 Insomnia, unspecified: Secondary | ICD-10-CM

## 2024-06-20 DIAGNOSIS — F419 Anxiety disorder, unspecified: Secondary | ICD-10-CM

## 2024-06-20 MED ORDER — ALPRAZOLAM 1 MG PO TABS
ORAL_TABLET | ORAL | 0 refills | Status: DC
Start: 2024-06-20 — End: 2024-07-23

## 2024-06-20 NOTE — Progress Notes (Signed)
 Requesting a refill on Alprazolam . UDS and controlled substance agreement UTD. PDMP reviewed. Last refill 05/19/2024.

## 2024-06-24 ENCOUNTER — Telehealth: Payer: Self-pay | Admitting: Medical Oncology

## 2024-06-24 NOTE — Telephone Encounter (Signed)
 CT scan date changed . Pt r/s with Mohamed.

## 2024-06-25 ENCOUNTER — Ambulatory Visit (HOSPITAL_COMMUNITY)

## 2024-06-25 ENCOUNTER — Inpatient Hospital Stay: Attending: Internal Medicine

## 2024-06-25 DIAGNOSIS — Z7982 Long term (current) use of aspirin: Secondary | ICD-10-CM | POA: Insufficient documentation

## 2024-06-25 DIAGNOSIS — M160 Bilateral primary osteoarthritis of hip: Secondary | ICD-10-CM | POA: Insufficient documentation

## 2024-06-25 DIAGNOSIS — Z8572 Personal history of non-Hodgkin lymphomas: Secondary | ICD-10-CM | POA: Diagnosis not present

## 2024-06-25 DIAGNOSIS — K449 Diaphragmatic hernia without obstruction or gangrene: Secondary | ICD-10-CM | POA: Insufficient documentation

## 2024-06-25 DIAGNOSIS — Z90722 Acquired absence of ovaries, bilateral: Secondary | ICD-10-CM | POA: Diagnosis not present

## 2024-06-25 DIAGNOSIS — Z79899 Other long term (current) drug therapy: Secondary | ICD-10-CM | POA: Insufficient documentation

## 2024-06-25 DIAGNOSIS — Z90710 Acquired absence of both cervix and uterus: Secondary | ICD-10-CM | POA: Insufficient documentation

## 2024-06-25 DIAGNOSIS — D1803 Hemangioma of intra-abdominal structures: Secondary | ICD-10-CM | POA: Diagnosis not present

## 2024-06-25 DIAGNOSIS — T7802XA Anaphylactic reaction due to shellfish (crustaceans), initial encounter: Secondary | ICD-10-CM | POA: Diagnosis not present

## 2024-06-25 DIAGNOSIS — E785 Hyperlipidemia, unspecified: Secondary | ICD-10-CM | POA: Insufficient documentation

## 2024-06-25 DIAGNOSIS — F419 Anxiety disorder, unspecified: Secondary | ICD-10-CM | POA: Insufficient documentation

## 2024-06-25 DIAGNOSIS — M954 Acquired deformity of chest and rib: Secondary | ICD-10-CM | POA: Insufficient documentation

## 2024-06-25 DIAGNOSIS — Z880 Allergy status to penicillin: Secondary | ICD-10-CM | POA: Diagnosis not present

## 2024-06-25 DIAGNOSIS — Z923 Personal history of irradiation: Secondary | ICD-10-CM | POA: Insufficient documentation

## 2024-06-25 DIAGNOSIS — Z91013 Allergy to seafood: Secondary | ICD-10-CM | POA: Diagnosis not present

## 2024-06-25 DIAGNOSIS — I700 Atherosclerosis of aorta: Secondary | ICD-10-CM | POA: Insufficient documentation

## 2024-06-25 DIAGNOSIS — Z85118 Personal history of other malignant neoplasm of bronchus and lung: Secondary | ICD-10-CM | POA: Insufficient documentation

## 2024-06-25 DIAGNOSIS — K529 Noninfective gastroenteritis and colitis, unspecified: Secondary | ICD-10-CM | POA: Diagnosis not present

## 2024-06-25 DIAGNOSIS — I10 Essential (primary) hypertension: Secondary | ICD-10-CM | POA: Diagnosis not present

## 2024-06-25 DIAGNOSIS — M47816 Spondylosis without myelopathy or radiculopathy, lumbar region: Secondary | ICD-10-CM | POA: Diagnosis not present

## 2024-06-25 DIAGNOSIS — C3490 Malignant neoplasm of unspecified part of unspecified bronchus or lung: Secondary | ICD-10-CM

## 2024-06-25 DIAGNOSIS — K5730 Diverticulosis of large intestine without perforation or abscess without bleeding: Secondary | ICD-10-CM | POA: Insufficient documentation

## 2024-06-25 DIAGNOSIS — E039 Hypothyroidism, unspecified: Secondary | ICD-10-CM | POA: Diagnosis not present

## 2024-06-25 LAB — CBC WITH DIFFERENTIAL (CANCER CENTER ONLY)
Abs Immature Granulocytes: 0.01 K/uL (ref 0.00–0.07)
Basophils Absolute: 0 K/uL (ref 0.0–0.1)
Basophils Relative: 0 %
Eosinophils Absolute: 0.2 K/uL (ref 0.0–0.5)
Eosinophils Relative: 3 %
HCT: 36.5 % (ref 36.0–46.0)
Hemoglobin: 12.1 g/dL (ref 12.0–15.0)
Immature Granulocytes: 0 %
Lymphocytes Relative: 19 %
Lymphs Abs: 1.1 K/uL (ref 0.7–4.0)
MCH: 27.7 pg (ref 26.0–34.0)
MCHC: 33.2 g/dL (ref 30.0–36.0)
MCV: 83.5 fL (ref 80.0–100.0)
Monocytes Absolute: 0.5 K/uL (ref 0.1–1.0)
Monocytes Relative: 9 %
Neutro Abs: 3.9 K/uL (ref 1.7–7.7)
Neutrophils Relative %: 69 %
Platelet Count: 186 K/uL (ref 150–400)
RBC: 4.37 MIL/uL (ref 3.87–5.11)
RDW: 13.5 % (ref 11.5–15.5)
WBC Count: 5.6 K/uL (ref 4.0–10.5)
nRBC: 0 % (ref 0.0–0.2)

## 2024-06-25 LAB — CMP (CANCER CENTER ONLY)
ALT: 16 U/L (ref 0–44)
AST: 17 U/L (ref 15–41)
Albumin: 3.6 g/dL (ref 3.5–5.0)
Alkaline Phosphatase: 66 U/L (ref 38–126)
Anion gap: 6 (ref 5–15)
BUN: 14 mg/dL (ref 8–23)
CO2: 29 mmol/L (ref 22–32)
Calcium: 8.7 mg/dL — ABNORMAL LOW (ref 8.9–10.3)
Chloride: 106 mmol/L (ref 98–111)
Creatinine: 0.8 mg/dL (ref 0.44–1.00)
GFR, Estimated: >60 mL/min (ref >60)
Glucose, Bld: 86 mg/dL (ref 70–99)
Potassium: 4.2 mmol/L (ref 3.5–5.1)
Sodium: 141 mmol/L (ref 135–145)
Total Bilirubin: 0.9 mg/dL (ref 0.0–1.2)
Total Protein: 6.7 g/dL (ref 6.5–8.1)

## 2024-06-27 ENCOUNTER — Other Ambulatory Visit: Payer: Self-pay

## 2024-06-27 ENCOUNTER — Other Ambulatory Visit: Payer: Self-pay | Admitting: Internal Medicine

## 2024-06-27 MED ORDER — AFATINIB DIMALEATE 30 MG PO TABS
ORAL_TABLET | ORAL | 2 refills | Status: DC
  Filled 2024-06-27: qty 30, fill #0
  Filled 2024-06-30: qty 30, 30d supply, fill #0
  Filled 2024-07-24: qty 30, 30d supply, fill #1
  Filled 2024-08-27: qty 30, 30d supply, fill #2

## 2024-06-30 ENCOUNTER — Other Ambulatory Visit: Payer: Self-pay | Admitting: Pharmacy Technician

## 2024-06-30 ENCOUNTER — Other Ambulatory Visit: Payer: Self-pay

## 2024-06-30 NOTE — Progress Notes (Signed)
 Specialty Pharmacy Refill Coordination Note  Elizabeth [name] is a 78 y.o. female contacted today regarding refills of specialty medication(s) Afatinib  Dimaleate (GILOTRIF )   Patient requested Delivery   Delivery date: 07/04/24   Verified address: [address]  [place]   Medication will be filled on 07/03/24.

## 2024-07-01 ENCOUNTER — Ambulatory Visit (HOSPITAL_COMMUNITY)
Admission: RE | Admit: 2024-07-01 | Discharge: 2024-07-01 | Disposition: A | Discharge status: Home / Self Care | Source: Ambulatory Visit | Attending: Internal Medicine | Admitting: Internal Medicine

## 2024-07-01 ENCOUNTER — Encounter (HOSPITAL_COMMUNITY): Payer: Self-pay

## 2024-07-01 DIAGNOSIS — C3490 Malignant neoplasm of unspecified part of unspecified bronchus or lung: Secondary | ICD-10-CM | POA: Insufficient documentation

## 2024-07-01 MED ORDER — IOHEXOL 300 MG/ML  SOLN
100.0000 mL | Freq: Once | INTRAMUSCULAR | Status: AC | PRN
  Administered 2024-07-01: 78 mL via INTRAVENOUS

## 2024-07-02 ENCOUNTER — Inpatient Hospital Stay: Admitting: Internal Medicine

## 2024-07-02 ENCOUNTER — Other Ambulatory Visit: Payer: Self-pay

## 2024-07-10 ENCOUNTER — Inpatient Hospital Stay (HOSPITAL_BASED_OUTPATIENT_CLINIC_OR_DEPARTMENT_OTHER): Admitting: Internal Medicine

## 2024-07-10 ENCOUNTER — Telehealth: Payer: Self-pay | Admitting: Internal Medicine

## 2024-07-10 VITALS — BP 136/82 | HR 78 | Temp 97.6°F | Resp 17 | Ht 60.0 in | Wt 164.2 lb

## 2024-07-10 DIAGNOSIS — C3490 Malignant neoplasm of unspecified part of unspecified bronchus or lung: Secondary | ICD-10-CM | POA: Diagnosis not present

## 2024-07-10 DIAGNOSIS — Z85118 Personal history of other malignant neoplasm of bronchus and lung: Secondary | ICD-10-CM | POA: Diagnosis not present

## 2024-07-10 NOTE — Telephone Encounter (Signed)
 Scheduled patient appointments for December. Called and spoke with the patient, she is aware.

## 2024-07-10 NOTE — Progress Notes (Signed)
 [name] [phone]   [phone]  OFFICE PROGRESS NOTE  Elizabeth Philippe SAUNDERS, Elizabeth [name] [address] [place] [address]  DIAGNOSIS: Stage IV (T2a, N0, M1a) non-small cell lung cancer, adenocarcinoma with positive EGFR mutation with deletion in exon 19 diagnosed in June 2016 and presented with large mass in the left lower lobe in addition to multiple bilateral pulmonary nodules  PRIOR THERAPY:  1) Gilotrif  40 mg by mouth daily started 05/11/2015, status post 9 months of treatment discontinued on 02/17/2016 secondary to extensive skin rash. 2) SBRT to the enlarging left upper lobe nodule under the care of Dr. Dewey.  CURRENT THERAPY: Gilotrif  30 mg by mouth daily started 02/21/2016 status post 100 months of treatment.  INTERVAL HISTORY: Elizabeth [name] 78 y.o. female returns to the clinic today for follow-up visit.  Discussed the use of AI scribe software for clinical note transcription with the patient, who gave verbal consent to proceed.  History of Present Illness Elizabeth [name] is a 78 year old female with stage four non-small cell lung cancer who presents for evaluation with repeat CT scan of the chest, abdomen, and pelvis.  She was diagnosed with stage four non-small cell lung cancer, adenocarcinoma, with a positive EGFR mutation in June 2016. She began treatment with Gilotrif  (afatinib ) 40 mg PO daily on May 11, 2015, which was reduced to 30 mg PO daily in May 2017. She has been on this treatment for a total of 109 months.  She experiences chronic diarrhea, which she attributes to her medication, Gilotrif . She considers this a positive side effect as it prevents constipation. No new chest pain or breathing issues beyond her usual symptoms.  She experienced abdominal pain on Tuesday, which did not cause nausea or diarrhea, and her appetite remained good. The pain did not worsen with palpation. A CT scan of the abdomen appeared  normal.  She recalls a previous allergic reaction to shrimp on August 17, which caused swelling of her lips and a scratchy throat. She had not consumed shrimp for nine years prior to this incident. During the reaction, she passed out and experienced low blood pressure, requiring medical attention. A CT scan at that time showed some density on the left side.     MEDICAL HISTORY: Past Medical History:  Diagnosis Date   Adenocarcinoma of left lung, stage 4 (HCC) 04/05/2015   Biopsy confirmed CT SCAN: There are innumerable bilateral pulmonary nodules. These range in size from about 5 mm to 2 cm. They demonstrate irregular indistinct borders, the larger ones demonstrating spiculation. PET SCAN: Diffuse pulmonary metastatic disease with a 4 cm dominant left lower low lung lesion. No enlarged or hypermetabolic mediastinal or hilar adenopathy.   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  MEDICATIONS:  Current Outpatient Medications  Medication Sig Dispense Refill   afatinib  dimaleate (GILOTRIF ) 30 MG tablet TAKE 1 TABLET (30MG ) BY  MOUTH ONCE DAILY. TAKE ON AN EMPTY STOMACH 1 HOUR BEFORE OR 2 HOURS AFTER A MEAL 30 tablet 2   ALPRAZolam  (XANAX ) 1 MG tablet TAKE 1 TABLET BY MOUTH AT BEDTIME. MAY TAKE 1/2 TABLET EXTRA DAILY AS NEEDED 45 tablet 0   aspirin  81 MG tablet Take 81 mg by mouth at bedtime.      EPINEPHrine  0.3 mg/0.3 mL IJ SOAJ injection Inject 0.3 mg into the muscle as needed for anaphylaxis. 1 each 0    ezetimibe  (ZETIA ) 10 MG tablet Take 1 tablet (10 mg total) by mouth daily. 90 tablet 3   hydrochlorothiazide  (HYDRODIURIL ) 25 MG tablet Take 0.5 tablets (12.5 mg total) by mouth daily.     HYDROcodone  bit-homatropine (HYCODAN) 5-1.5 MG/5ML syrup Take 5 mLs by mouth every 6 (six) hours as needed for cough. 120 mL 0   ibuprofen  (ADVIL ,MOTRIN ) 200 MG tablet Take 400 mg by mouth every 6 (six) hours as needed for headache or moderate pain.     levothyroxine  (SYNTHROID ) 50 MCG tablet Take 1 tablet (50 mcg total) by mouth daily. 90 tablet 3   losartan  (COZAAR ) 50 MG tablet TAKE 1 TABLET(50 MG) BY MOUTH DAILY 30 tablet 11   montelukast  (SINGULAIR ) 10 MG tablet TAKE 1 TABLET BY MOUTH FOR ALLERGIES 90 tablet 3   omeprazole (PRILOSEC) 20 MG capsule Take 20 mg by mouth daily as needed.     No current facility-administered medications for this visit.    SURGICAL HISTORY:  Past Surgical History:  Procedure Laterality Date   ABDOMINAL HYSTERECTOMY  10/23/1993   w BSO   CYST REMOVAL HAND Right    EXCISION MASS ABDOMINAL N/A 08/11/2021   Procedure: EXCISIONAL BIOPSY OF LEFT RETROPERITONEAL MASS, POSSIBLE INTRAOPERATIVE LAPAROSCOPIC ULTRASOUND;  Surgeon: Sheldon Standing, MD;  Location: [facility];  Service: General;  Laterality: N/A;   LAPAROSCOPY N/A 08/11/2021   Procedure: LAPAROSCOPIC EXPLORATION;  Surgeon: Sheldon Standing, MD;  Location: [facility];  Service: General;  Laterality: N/A;   TONSILLECTOMY AND ADENOIDECTOMY      REVIEW OF SYSTEMS:  Constitutional: positive for fatigue Eyes: negative Ears, nose, mouth, throat, and face: negative Respiratory: positive for dyspnea on exertion Cardiovascular: negative Gastrointestinal: negative Genitourinary:negative Integument/breast: negative Hematologic/lymphatic: negative Musculoskeletal:negative Neurological: negative Behavioral/Psych: negative Endocrine: negative Allergic/Immunologic: negative   PHYSICAL EXAMINATION: General appearance: alert,  cooperative, fatigued, and no distress Head: Normocephalic, without obvious abnormality, atraumatic Neck: no adenopathy, no JVD, supple, symmetrical, trachea midline, and thyroid  not enlarged, symmetric, no tenderness/mass/nodules Lymph nodes: Cervical, supraclavicular, and axillary nodes normal. Resp: clear to auscultation bilaterally Back: symmetric, no curvature. ROM normal. No CVA tenderness. Cardio: regular rate and rhythm, S1, S2 normal, no murmur, click, rub or gallop GI: soft, non-tender; bowel sounds normal; no masses,  no organomegaly Extremities: extremities normal, atraumatic, no cyanosis or edema Neurologic: Alert and oriented X 3, normal strength and tone. Normal symmetric reflexes. Normal coordination and gait  ECOG PERFORMANCE STATUS: 1 - Symptomatic but completely ambulatory  Blood pressure 136/82, pulse 78, temperature 97.6 F (36.4 C), resp. rate 17, height 5' (1.524 m), weight 164 lb 3.2 oz (74.5 kg), SpO2 97%.  LABORATORY DATA: Lab Results  Component Value Date   WBC 5.6 06/25/2024   HGB 12.1 06/25/2024   HCT 36.5 06/25/2024   MCV 83.5 06/25/2024   PLT 186 06/25/2024      Chemistry      Component Value Date/Time   NA 141 06/25/2024 1039   NA 139 10/26/2017 1252   K 4.2 06/25/2024 1039  K 4.1 10/26/2017 1252   CL 106 06/25/2024 1039   CO2 29 06/25/2024 1039   CO2 28 10/26/2017 1252   BUN 14 06/25/2024 1039   BUN 15.7 10/26/2017 1252   CREATININE 0.80 06/25/2024 1039   CREATININE 0.92 04/24/2024 1442   CREATININE 1.0 10/26/2017 1252      Component Value Date/Time   CALCIUM  8.7 (L) 06/25/2024 1039   CALCIUM  8.8 10/26/2017 1252   ALKPHOS 66 06/25/2024 1039   ALKPHOS 70 10/26/2017 1252   AST 17 06/25/2024 1039   AST 19 10/26/2017 1252   ALT 16 06/25/2024 1039   ALT 22 10/26/2017 1252   BILITOT 0.9 06/25/2024 1039   BILITOT 0.90 10/26/2017 1252       RADIOGRAPHIC STUDIES: CT ABDOMEN PELVIS W CONTRAST Result Date: 07/09/2024 CLINICAL DATA:   History of non-small cell lung cancer. Progressive findings on recent chest CT (06/08/2024). * Tracking Code: BO * EXAM: CT ABDOMEN AND PELVIS WITH CONTRAST TECHNIQUE: Multidetector CT imaging of the abdomen and pelvis was performed using the standard protocol following bolus administration of intravenous contrast. RADIATION DOSE REDUCTION: This exam was performed according to the departmental dose-optimization program which includes automated exposure control, adjustment of the mA and/or kV according to patient size and/or use of iterative reconstruction technique. CONTRAST:  78mL OMNIPAQUE  IOHEXOL  300 MG/ML  SOLN COMPARISON:  CT scan 03/28/2024 FINDINGS: Lower chest: Stable appearing post treatment changes in the left lower lobe with adjacent enlarging soft tissue nodule. Central right lower lobe lung lesion measures 16 x 11 mm and previously measured 15 x 10 mm. No pleural effusions or pleural lesions. The heart is normal in size. No pericardial effusion. Stable pectus deformity. Stable hiatal hernia. Hepatobiliary: Stable segment 7 hemangioma. No worrisome hepatic lesions to suggest metastatic disease. The gallbladder is unremarkable. No common bile duct dilatation. Pancreas: Unremarkable. No pancreatic ductal dilatation or surrounding inflammatory changes. Spleen: Normal in size without focal abnormality. Adrenals/Urinary Tract: No adrenal gland lesions. No renal lesions. There is a persistent soft tissue mass posterior to the right kidney. This measures 19 x 16 mm and is unchanged. The bladder is unremarkable. Stomach/Bowel: The stomach, duodenum, small and colon are unremarkable. No acute inflammatory process, mass lesions or obstructive findings. The terminal ileum and appendix are normal. Scattered colonic diverticulosis. Vascular/Lymphatic: The aorta and branch vessels are patent. Stable atherosclerotic calcifications. The major venous structures are patent. Stable right retroperitoneal lymph node just  posterior to the IVC and right renal vein measuring 11 mm. Reproductive: Surgically absent. Other: No pelvic mass or adenopathy. No free pelvic fluid collections. No inguinal mass or adenopathy. No abdominal wall hernia or subcutaneous lesions. Musculoskeletal: No significant bony findings. Stable degenerative changes involving the lumbar spine and hips. IMPRESSION: 1. Stable appearing post treatment changes in the left lower lobe with adjacent enlarging soft tissue nodule. 2. Central right lower lobe lung lesion measures 16 x 11 mm and previously measured 15 x 10 mm. 3. Stable right retroperitoneal lymph node and soft tissue mass posterior to the right kidney. 4. No other findings for abdominal/pelvic metastatic disease. 5. Stable segment 7 hepatic hemangioma. 6. Stable hiatal hernia. 7. Aortic atherosclerosis. Aortic Atherosclerosis (ICD10-I70.0). Electronically Signed   By: MYRTIS Stammer M.D.   On: 07/09/2024 16:19      ASSESSMENT AND PLAN:  This is a very pleasant 78 years old white female with stage IV non-small cell lung cancer, adenocarcinoma with positive EGFR mutation with deletion in exon 19 and currently undergoing treatment with  Gilotrif  initially at a dose of 40 mg for 9 months followed by 30 mg by mouth daily status post 97  months.  She also underwent SBRT to the enlarging left upper lobe lung nodule. Her PET scan showed hypermetabolic subcutaneous nodule anterior to the left shoulder musculature concerning for cutaneous metastasis in addition to the enlarging nodule in the retroperitoneum anterior to the left iliacus muscle that also hypermetabolic and concerning for metastatic deposit with enlarging hypermetabolic nodule and consolidation peripherally in the left upper lobe concerning for lung cancer recurrence. She had surgical resection of the soft tissue lesion in the retroperitoneum anterior to the left iliacus muscle and this was consistent with follicular lymphoma. Her scan in  September 2024 showed no concerning findings for disease progression except for enlargement of her retroperitoneal nodule posterior to the right kidney currently measuring 2.1 x 1.7 cm. 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 She has been tolerating her treatment with GILOTRIF  fairly well. She had CT angiogram of the chest performed in August 2025 in addition to CT scan of the abdomen and pelvis recently.  I personally independently reviewed the scan imaging and discussed the result and showed the images to the patient today.  Her scan showed no concerning finding for disease progression except for a slightly increased density in the left lung that need close monitoring. Assessment and Plan Assessment & Plan Stage IV non-small cell lung cancer with EGFR mutation (exon 19 deletion) Diagnosed in June 2016, currently on afatinib  therapy for 109 months. Recent CT scan shows mild increase in nodule size, less significant than previously described. No new symptoms such as chest pain or dyspnea. Abdominal pain present but CT scan of abdomen is unremarkable. 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 Recent exposure resulted in severe allergic reaction requiring medical attention. - Advise to avoid shrimp and related allergens She was advised to call immediately if she has any concerning symptoms in the interval.  The patient voices understanding of current disease status and treatment options and is in agreement with the current care  plan. All questions were answered. The patient knows to call the clinic with any problems, questions or concerns. We can certainly see the patient much sooner if necessary. The total time spent in the appointment was 30 minutes.  Disclaimer: This note was dictated with voice recognition software. Similar sounding words can inadvertently be transcribed and may not be corrected upon review.

## 2024-07-22 ENCOUNTER — Encounter: Payer: Self-pay | Admitting: Family Medicine

## 2024-07-23 ENCOUNTER — Other Ambulatory Visit: Payer: Self-pay | Admitting: Family Medicine

## 2024-07-23 DIAGNOSIS — F419 Anxiety disorder, unspecified: Secondary | ICD-10-CM

## 2024-07-23 DIAGNOSIS — G4700 Insomnia, unspecified: Secondary | ICD-10-CM

## 2024-07-23 MED ORDER — ALPRAZOLAM 1 MG PO TABS: ORAL_TABLET | ORAL | 0 refills | Status: DC

## 2024-07-23 NOTE — Progress Notes (Signed)
 Needs refill on medication. PDMP reviewed. Last refill was on 06/21/2023. UDS will need to be collected on 10/03 at scheduled appointment. Controlled substance agreement is UTD.

## 2024-07-24 ENCOUNTER — Other Ambulatory Visit: Payer: Self-pay

## 2024-07-24 ENCOUNTER — Other Ambulatory Visit: Payer: Self-pay | Admitting: Pharmacy Technician

## 2024-07-24 ENCOUNTER — Encounter (INDEPENDENT_AMBULATORY_CARE_PROVIDER_SITE_OTHER): Payer: Self-pay

## 2024-07-24 NOTE — Progress Notes (Signed)
 Specialty Pharmacy Refill Coordination Note  Elizabeth [name] is a 78 y.o. female contacted today regarding refills of specialty medication(s) Afatinib  Dimaleate (GILOTRIF )   Patient requested (Patient-Rptd) Delivery   Delivery date: 08/05/24 Verified address: (Patient-Rptd) [address] [place] [phone]   Medication will be filled on 08/04/24.

## 2024-07-25 ENCOUNTER — Encounter: Payer: Self-pay | Admitting: Family Medicine

## 2024-07-25 ENCOUNTER — Ambulatory Visit (INDEPENDENT_AMBULATORY_CARE_PROVIDER_SITE_OTHER): Admitting: Family Medicine

## 2024-07-25 VITALS — BP 126/64 | HR 89 | Temp 97.8°F | Ht 60.0 in | Wt 165.0 lb

## 2024-07-25 DIAGNOSIS — Z0000 Encounter for general adult medical examination without abnormal findings: Secondary | ICD-10-CM | POA: Diagnosis not present

## 2024-07-25 DIAGNOSIS — G4700 Insomnia, unspecified: Secondary | ICD-10-CM | POA: Diagnosis not present

## 2024-07-25 DIAGNOSIS — I10 Essential (primary) hypertension: Secondary | ICD-10-CM

## 2024-07-25 DIAGNOSIS — F419 Anxiety disorder, unspecified: Secondary | ICD-10-CM

## 2024-07-25 DIAGNOSIS — E782 Mixed hyperlipidemia: Secondary | ICD-10-CM

## 2024-07-25 DIAGNOSIS — E039 Hypothyroidism, unspecified: Secondary | ICD-10-CM

## 2024-07-25 DIAGNOSIS — M8580 Other specified disorders of bone density and structure, unspecified site: Secondary | ICD-10-CM

## 2024-07-25 DIAGNOSIS — E66811 Obesity, class 1: Secondary | ICD-10-CM

## 2024-07-25 MED ORDER — HYDROCHLOROTHIAZIDE 12.5 MG PO CAPS: 12.5000 mg | ORAL_CAPSULE | Freq: Every day | ORAL | 1 refills | Status: AC

## 2024-07-25 NOTE — Patient Instructions (Addendum)
 It was nice to see you! Today we: -Completed a physical exam and reviewed health maintenance. -Continue all medications. -Refilled Hydrochlorothiazide  12.5 mg. -Ordered future urine drug screen for Alprazolam  compliance. -Ordered future labs: TSH, lipids, A1c. Please schedule labs within the next 2 weeks. -Referral sent for DEXA bone density scan. If they do not reach out to schedule within 2 weeks, please let the provider know. Follow up in 3 months.

## 2024-07-25 NOTE — Progress Notes (Addendum)
 Complete physical exam  Patient: Elizabeth [name]   DOB: 04/04/1946   78 y.o. Female  MRN: [id number]  Subjective:    Chief Complaint  Patient presents with   Annual Exam   Elizabeth [name] is a 78 y.o. female who presents today for a complete physical exam. She reports consuming a general diet. The patient does not participate in regular exercise at present. She generally feels fairly well. She reports sleeping fairly well. She does not have additional problems to discuss today.   Has plans to see her orthopedic specialist for back and knee problems.  Most recent fall risk assessment:    07/25/2024   11:05 AM  Fall Risk   Falls in the past year? 0  Number falls in past yr: 0  Injury with Fall? 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   Metastatic lung cancer (metastasis from lung to other site) [facility]) 08/11/2021   Chest pain 07/05/2021   Osteopenia 05/18/2021   Obesity (BMI 30.0-34.9) 05/16/2021   Aortic atherosclerosis (HCC) by Abd CT scan on 01/26/2-021 05/29/2016   Encounter for antineoplastic chemotherapy 10/19/2015   Adenocarcinoma of left lung, stage 4 (HCC) 04/05/2015   Medication management 11/03/2014   Hypothyroidism 10/21/2013   Essential hypertension 10/21/2013   Hyperlipidemia, mixed 10/21/2013   Abnormal glucose    Anxiety    Insomnia    Patient Care Team: Billy Philippe SAUNDERS, NP as PCP - General (Family Medicine) Octavia Charleston, MD as Consulting Physician (Ophthalmology) Sheril Coy, MD as  Consulting Physician (Orthopedic Surgery) Sherrod Sherrod, MD as Consulting Physician (Oncology)   Outpatient Medications Prior to Visit  Medication Sig   afatinib  dimaleate (GILOTRIF ) 30 MG tablet TAKE 1 TABLET (30MG ) BY MOUTH ONCE DAILY. TAKE ON AN EMPTY STOMACH 1 HOUR BEFORE OR 2 HOURS AFTER A MEAL   ALPRAZolam  (XANAX ) 1 MG tablet TAKE 1 TABLET BY MOUTH AT BEDTIME. MAY TAKE 1/2 TABLET EXTRA DAILY AS NEEDED   aspirin  81 MG tablet Take 81 mg by mouth at bedtime.    EPINEPHrine  0.3 mg/0.3 mL IJ SOAJ injection Inject 0.3 mg into the muscle as needed for anaphylaxis.   ezetimibe  (ZETIA ) 10 MG tablet Take 1 tablet (10 mg total) by mouth daily.   HYDROcodone  bit-homatropine (HYCODAN) 5-1.5 MG/5ML syrup Take 5 mLs by mouth every 6 (six) hours as needed for cough.   ibuprofen  (ADVIL ,MOTRIN ) 200 MG tablet Take 400 mg by mouth every 6 (six) hours as needed for headache or moderate pain.   levothyroxine  (SYNTHROID ) 50 MCG tablet Take 1 tablet (50 mcg total) by mouth daily.   losartan  (COZAAR ) 50 MG tablet TAKE 1 TABLET(50 MG) BY MOUTH DAILY   montelukast  (SINGULAIR ) 10 MG tablet TAKE 1 TABLET BY MOUTH FOR ALLERGIES   [DISCONTINUED] hydrochlorothiazide  (HYDRODIURIL ) 25 MG tablet Take 0.5 tablets (12.5 mg total) by mouth daily.   [DISCONTINUED] omeprazole (PRILOSEC) 20 MG capsule Take 20 mg by mouth daily as needed.  No facility-administered medications prior to visit.   Review of Systems  Constitutional: Negative.   HENT: Negative.    Eyes: Negative.   Respiratory: Negative.    Cardiovascular: Negative.   Gastrointestinal: Negative.   Genitourinary: Negative.   Musculoskeletal: Negative.   Skin: Negative.   Neurological: Negative.   Psychiatric/Behavioral: Negative.       Objective:    BP 126/64   Pulse 89   Temp 97.8 F (36.6 C) (Oral)   Ht 5' (1.524 m)   Wt 165 lb (74.8 kg)   SpO2 98%   BMI 32.22 kg/m  BP Readings from Last 3 Encounters:  07/25/24 126/64  07/10/24 136/82   06/08/24 128/81   Wt Readings from Last 3 Encounters:  07/25/24 165 lb (74.8 kg)  07/10/24 164 lb 3.2 oz (74.5 kg)  04/24/24 164 lb (74.4 kg)   Physical Exam Constitutional:      Appearance: Normal appearance. She is obese.  HENT:     Head: Normocephalic.     Right Ear: Tympanic membrane, ear canal and external ear normal.     Left Ear: Tympanic membrane, ear canal and external ear normal.     Nose: Nose normal.     Mouth/Throat:     Mouth: Mucous membranes are moist.     Pharynx: Oropharynx is clear.  Eyes:     Extraocular Movements: Extraocular movements intact.     Conjunctiva/sclera: Conjunctivae normal.     Pupils: Pupils are equal, round, and reactive to light.  Cardiovascular:     Rate and Rhythm: Normal rate and regular rhythm.     Pulses: Normal pulses.     Heart sounds: Normal heart sounds.  Pulmonary:     Effort: Pulmonary effort is normal.     Breath sounds: Normal breath sounds.  Abdominal:     General: Bowel sounds are normal.     Palpations: Abdomen is soft.  Musculoskeletal:        General: Normal range of motion.     Cervical back: Normal range of motion.  Skin:    General: Skin is warm and dry.  Neurological:     General: No focal deficit present.     Mental Status: She is alert and oriented to person, place, and time. Mental status is at baseline.  Psychiatric:        Mood and Affect: Mood normal.        Behavior: Behavior normal.        Thought Content: Thought content normal.        Judgment: Judgment normal.       Assessment & Plan:    Routine Health Maintenance and Physical Exam Immunization History  Administered Date(s) Administered   Fluad Trivalent(High Dose 65+) 08/28/2023   INFLUENZA, HIGH DOSE SEASONAL PF 07/31/2014, 07/31/2014, 08/06/2017, 08/06/2017, 07/09/2019, 07/09/2019, 07/07/2020, 08/02/2022, 08/02/2022   Influenza Split 07/16/2018   Influenza-Unspecified 09/13/2015, 07/23/2016, 08/06/2017, 07/23/2018   Moderna Covid-19  Vaccine Bivalent Booster 5yrs & up 09/09/2021   Moderna Sars-Covid-2 Vaccination 12/05/2019, 01/03/2020, 07/26/2020, 05/16/2021   Pneumococcal Conjugate-13 12/24/2015   Pneumococcal Polysaccharide-23 07/22/2012   Pneumococcal-Unspecified 07/22/2012   Td 12/25/2005   Zoster, Live 01/09/2007   Health Maintenance  Topic Date Due   Zoster Vaccines- Shingrix (1 of 2) 01/07/1966   Medicare Annual Wellness (AWV)  04/19/2024   Influenza Vaccine  05/23/2024   COVID-19 Vaccine (6 - 2025-26 season) 06/23/2024   DEXA SCAN  07/24/2024   Mammogram  04/11/2025   Pneumococcal Vaccine: 50+  Years  Completed   HPV VACCINES  Aged Out   Meningococcal B Vaccine  Aged Out   DTaP/Tdap/Td  Discontinued   Colonoscopy  Discontinued   Hepatitis C Screening  Discontinued   -Zoster: Denies. -Influenza: Plans on getting at local pharmacy. -COVID: Denies. -DEXA: Last done on 07/21/22. Referral sent. -AWV: Denies.  Discussed health benefits of physical activity, and encouraged her to engage in regular exercise appropriate for her age and condition.  Annual physical exam  Insomnia, unspecified type -     DRUG MONITOR, PANEL 1, SCREEN, URINE; Future  Anxiety -     DRUG MONITOR, PANEL 1, SCREEN, URINE; Future  Essential hypertension -     hydroCHLOROthiazide ; Take 1 capsule (12.5 mg total) by mouth daily.  Dispense: 90 capsule; Refill: 1  Osteopenia, unspecified location -     DG Bone Density; Future  Hyperlipidemia, mixed -     Lipid panel; Future  Acquired hypothyroidism -     TSH; Future  Obesity (BMI 30.0-34.9) -     Hemoglobin A1c; Future   -Completed a physical exam and reviewed health maintenance. -Continue all medications. -Refilled Hydrochlorothiazide  12.5 mg. -Reviewed PDMP. Last Alprazolam  (Xanax ) 1 mg refilled on 07/23/2024.  -Ordered future urine drug screen for Alprazolam  compliance. -Ordered future labs: TSH, lipids, A1c. Please schedule labs within the next 2 weeks. CBC and  CMP last completed on 06/25/2024 by oncology.  -Referral sent for DEXA bone density scan. If they do not reach out to schedule within 2 weeks, please let the provider know. Follow up in 3 months.\    I personally was present during the history, physical exam, and medical decision-making activities of this service and have verified that the service and findings are accurately documented in the nurse practitioner student's note.  JoAnna Williamson, NP  JoAnna Williamson, NP

## 2024-07-30 ENCOUNTER — Other Ambulatory Visit

## 2024-07-30 DIAGNOSIS — E782 Mixed hyperlipidemia: Secondary | ICD-10-CM

## 2024-07-30 DIAGNOSIS — E039 Hypothyroidism, unspecified: Secondary | ICD-10-CM

## 2024-07-30 DIAGNOSIS — E66811 Obesity, class 1: Secondary | ICD-10-CM

## 2024-07-30 DIAGNOSIS — F419 Anxiety disorder, unspecified: Secondary | ICD-10-CM

## 2024-07-30 DIAGNOSIS — G4700 Insomnia, unspecified: Secondary | ICD-10-CM

## 2024-07-30 LAB — LIPID PANEL
Cholesterol: 154 mg/dL (ref 0–200)
HDL: 48.8 mg/dL (ref >39.00)
LDL Cholesterol: 89 mg/dL (ref 0–99)
NonHDL: 105.16
Total CHOL/HDL Ratio: 3
Triglycerides: 82 mg/dL (ref 0.0–149.0)
VLDL: 16.4 mg/dL (ref 0.0–40.0)

## 2024-07-30 LAB — HEMOGLOBIN A1C: Hgb A1c MFr Bld: 6 % (ref 4.6–6.5)

## 2024-07-30 LAB — TSH: TSH: 4.28 u[IU]/mL (ref 0.35–5.50)

## 2024-07-31 ENCOUNTER — Ambulatory Visit: Payer: Self-pay | Admitting: Family Medicine

## 2024-07-31 LAB — DRUG MONITOR, PANEL 1, SCREEN, URINE
Amphetamines: NEGATIVE ng/mL (ref <500)
Barbiturates: NEGATIVE ng/mL (ref <300)
Benzodiazepines: POSITIVE ng/mL — AB (ref <100)
Cocaine Metabolite: NEGATIVE ng/mL (ref <150)
Creatinine: 189.9 mg/dL (ref >=20.0)
Marijuana Metabolite: NEGATIVE ng/mL (ref <20)
Methadone Metabolite: NEGATIVE ng/mL (ref <100)
Opiates: NEGATIVE ng/mL (ref <100)
Oxidant: NEGATIVE ug/mL (ref <200)
Oxycodone: NEGATIVE ng/mL (ref <100)
Phencyclidine: NEGATIVE ng/mL (ref <25)
pH: 8 (ref 4.5–9.0)

## 2024-07-31 LAB — DM TEMPLATE

## 2024-08-04 ENCOUNTER — Other Ambulatory Visit: Payer: Self-pay

## 2024-08-25 ENCOUNTER — Encounter: Payer: Self-pay | Admitting: Family Medicine

## 2024-08-27 ENCOUNTER — Encounter (INDEPENDENT_AMBULATORY_CARE_PROVIDER_SITE_OTHER): Payer: Self-pay

## 2024-08-27 ENCOUNTER — Other Ambulatory Visit: Payer: Self-pay

## 2024-08-27 ENCOUNTER — Other Ambulatory Visit: Payer: Self-pay | Admitting: Pharmacy Technician

## 2024-08-27 NOTE — Progress Notes (Signed)
 Specialty Pharmacy Refill Coordination Note  KIELY COUSAR is a 78 y.o. female contacted today regarding refills of specialty medication(s) Afatinib  Dimaleate (GILOTRIF )   Patient requested (Patient-Rptd) Delivery   Delivery date: 09/03/2024 Verified address: (Patient-Rptd) [address] [facility] [phone]   Medication will be filled on: 09/02/2024

## 2024-08-28 ENCOUNTER — Other Ambulatory Visit: Payer: Self-pay | Admitting: Family Medicine

## 2024-08-28 ENCOUNTER — Other Ambulatory Visit: Payer: Self-pay

## 2024-08-28 DIAGNOSIS — G4700 Insomnia, unspecified: Secondary | ICD-10-CM

## 2024-08-28 DIAGNOSIS — F419 Anxiety disorder, unspecified: Secondary | ICD-10-CM

## 2024-08-28 MED ORDER — ALPRAZOLAM 1 MG PO TABS: ORAL_TABLET | ORAL | 0 refills | Status: DC

## 2024-08-28 NOTE — Progress Notes (Signed)
 Patient is requesting refill on Alprazolam . PMPD reviewed. Last refill was 07/23/2024. UDS and controlled substance contract is UTD.

## 2024-09-01 ENCOUNTER — Other Ambulatory Visit: Payer: Self-pay

## 2024-09-03 ENCOUNTER — Other Ambulatory Visit: Payer: Self-pay

## 2024-09-03 NOTE — Progress Notes (Signed)
 Specialty Pharmacy Ongoing Clinical Assessment Note  Elizabeth [name] is a 78 y.o. female who is being followed by the specialty pharmacy service for RxSp Oncology   Patient's specialty medication(s) reviewed today: Afatinib  Dimaleate (GILOTRIF )   Missed doses in the last 4 weeks: 0   Patient/Caregiver did not have any additional questions or concerns.   Therapeutic benefit summary: Patient is achieving benefit   Adverse events/side effects summary: No adverse events/side effects   Patient's therapy is appropriate to: Continue    Goals Addressed             This Visit's Progress    Slow Disease Progression   On track    Patient is on track. Patient will maintain adherence. Per Dr. Jeannett note from 07/10/24, CT of abdomen and pelvis showed no concerning findings for disease progression but slightly increased density in left lung that [name] monitoring. Pt to have repeat scans in 3 months         Follow up: 6 months  [facility]

## 2024-09-25 ENCOUNTER — Other Ambulatory Visit: Payer: Self-pay | Admitting: Internal Medicine

## 2024-09-25 ENCOUNTER — Other Ambulatory Visit: Payer: Self-pay

## 2024-09-25 ENCOUNTER — Other Ambulatory Visit: Payer: Self-pay | Admitting: Pharmacy Technician

## 2024-09-25 ENCOUNTER — Other Ambulatory Visit (HOSPITAL_COMMUNITY): Payer: Self-pay

## 2024-09-25 MED ORDER — AFATINIB DIMALEATE 30 MG PO TABS
ORAL_TABLET | ORAL | 2 refills | Status: AC
  Filled 2024-09-26: qty 30, 30d supply, fill #0
  Filled 2024-10-24: qty 30, 30d supply, fill #1
  Filled 2024-11-26: qty 30, 30d supply, fill #2

## 2024-09-25 NOTE — Progress Notes (Signed)
 Specialty Pharmacy Refill Coordination Note  Elizabeth [name] is a 78 y.o. female contacted today regarding refills of specialty medication(s) Afatinib  Dimaleate (GILOTRIF )   Patient requested (Patient-Rptd) Delivery   Delivery date: 10/02/2024 Verified address: (Patient-Rptd) [address] [facility] [address] [facility]. [phone]   Medication [name] be filled on: 10/01/2024

## 2024-09-26 ENCOUNTER — Other Ambulatory Visit (HOSPITAL_COMMUNITY): Payer: Self-pay

## 2024-09-26 ENCOUNTER — Other Ambulatory Visit: Payer: Self-pay

## 2024-09-29 ENCOUNTER — Other Ambulatory Visit: Payer: Self-pay | Admitting: Family Medicine

## 2024-09-29 DIAGNOSIS — F419 Anxiety disorder, unspecified: Secondary | ICD-10-CM

## 2024-09-29 DIAGNOSIS — G4700 Insomnia, unspecified: Secondary | ICD-10-CM

## 2024-09-30 ENCOUNTER — Ambulatory Visit (HOSPITAL_COMMUNITY)
Admission: RE | Admit: 2024-09-30 | Discharge: 2024-09-30 | Disposition: A | Source: Ambulatory Visit | Attending: Internal Medicine | Admitting: Internal Medicine

## 2024-09-30 ENCOUNTER — Inpatient Hospital Stay: Attending: Internal Medicine

## 2024-09-30 DIAGNOSIS — Z880 Allergy status to penicillin: Secondary | ICD-10-CM | POA: Insufficient documentation

## 2024-09-30 DIAGNOSIS — C3490 Malignant neoplasm of unspecified part of unspecified bronchus or lung: Secondary | ICD-10-CM | POA: Insufficient documentation

## 2024-09-30 DIAGNOSIS — Z90722 Acquired absence of ovaries, bilateral: Secondary | ICD-10-CM | POA: Diagnosis not present

## 2024-09-30 DIAGNOSIS — R21 Rash and other nonspecific skin eruption: Secondary | ICD-10-CM | POA: Insufficient documentation

## 2024-09-30 DIAGNOSIS — F419 Anxiety disorder, unspecified: Secondary | ICD-10-CM | POA: Insufficient documentation

## 2024-09-30 DIAGNOSIS — E039 Hypothyroidism, unspecified: Secondary | ICD-10-CM | POA: Diagnosis not present

## 2024-09-30 DIAGNOSIS — Z7982 Long term (current) use of aspirin: Secondary | ICD-10-CM | POA: Diagnosis not present

## 2024-09-30 DIAGNOSIS — K449 Diaphragmatic hernia without obstruction or gangrene: Secondary | ICD-10-CM | POA: Insufficient documentation

## 2024-09-30 DIAGNOSIS — Z79890 Hormone replacement therapy (postmenopausal): Secondary | ICD-10-CM | POA: Diagnosis not present

## 2024-09-30 DIAGNOSIS — K429 Umbilical hernia without obstruction or gangrene: Secondary | ICD-10-CM | POA: Diagnosis not present

## 2024-09-30 DIAGNOSIS — R490 Dysphonia: Secondary | ICD-10-CM | POA: Insufficient documentation

## 2024-09-30 DIAGNOSIS — I700 Atherosclerosis of aorta: Secondary | ICD-10-CM | POA: Diagnosis not present

## 2024-09-30 DIAGNOSIS — J029 Acute pharyngitis, unspecified: Secondary | ICD-10-CM | POA: Insufficient documentation

## 2024-09-30 DIAGNOSIS — Z923 Personal history of irradiation: Secondary | ICD-10-CM | POA: Insufficient documentation

## 2024-09-30 DIAGNOSIS — Z79899 Other long term (current) drug therapy: Secondary | ICD-10-CM | POA: Diagnosis not present

## 2024-09-30 DIAGNOSIS — Z8572 Personal history of non-Hodgkin lymphomas: Secondary | ICD-10-CM | POA: Insufficient documentation

## 2024-09-30 DIAGNOSIS — R059 Cough, unspecified: Secondary | ICD-10-CM | POA: Insufficient documentation

## 2024-09-30 DIAGNOSIS — K760 Fatty (change of) liver, not elsewhere classified: Secondary | ICD-10-CM | POA: Diagnosis not present

## 2024-09-30 DIAGNOSIS — E785 Hyperlipidemia, unspecified: Secondary | ICD-10-CM | POA: Insufficient documentation

## 2024-09-30 DIAGNOSIS — T300 Burn of unspecified body region, unspecified degree: Secondary | ICD-10-CM | POA: Insufficient documentation

## 2024-09-30 DIAGNOSIS — Z85118 Personal history of other malignant neoplasm of bronchus and lung: Secondary | ICD-10-CM | POA: Insufficient documentation

## 2024-09-30 DIAGNOSIS — Z90710 Acquired absence of both cervix and uterus: Secondary | ICD-10-CM | POA: Insufficient documentation

## 2024-09-30 DIAGNOSIS — I10 Essential (primary) hypertension: Secondary | ICD-10-CM | POA: Diagnosis not present

## 2024-09-30 LAB — CMP (CANCER CENTER ONLY)
ALT: 20 U/L (ref 0–44)
AST: 28 U/L (ref 15–41)
Albumin: 3.9 g/dL (ref 3.5–5.0)
Alkaline Phosphatase: 61 U/L (ref 38–126)
Anion gap: 10 (ref 5–15)
BUN: 13 mg/dL (ref 8–23)
CO2: 26 mmol/L (ref 22–32)
Calcium: 9.1 mg/dL (ref 8.9–10.3)
Chloride: 105 mmol/L (ref 98–111)
Creatinine: 0.92 mg/dL (ref 0.44–1.00)
GFR, Estimated: >60 mL/min (ref >60)
Glucose, Bld: 98 mg/dL (ref 70–99)
Potassium: 4 mmol/L (ref 3.5–5.1)
Sodium: 141 mmol/L (ref 135–145)
Total Bilirubin: 0.7 mg/dL (ref 0.0–1.2)
Total Protein: 7 g/dL (ref 6.5–8.1)

## 2024-09-30 LAB — CBC WITH DIFFERENTIAL (CANCER CENTER ONLY)
Abs Immature Granulocytes: 0.05 K/uL (ref 0.00–0.07)
Basophils Absolute: 0.1 K/uL (ref 0.0–0.1)
Basophils Relative: 1 %
Eosinophils Absolute: 0.2 K/uL (ref 0.0–0.5)
Eosinophils Relative: 3 %
HCT: 37.2 % (ref 36.0–46.0)
Hemoglobin: 12.4 g/dL (ref 12.0–15.0)
Immature Granulocytes: 1 %
Lymphocytes Relative: 21 %
Lymphs Abs: 1.5 K/uL (ref 0.7–4.0)
MCH: 27.7 pg (ref 26.0–34.0)
MCHC: 33.3 g/dL (ref 30.0–36.0)
MCV: 83.2 fL (ref 80.0–100.0)
Monocytes Absolute: 0.7 K/uL (ref 0.1–1.0)
Monocytes Relative: 10 %
Neutro Abs: 4.7 K/uL (ref 1.7–7.7)
Neutrophils Relative %: 64 %
Platelet Count: 277 K/uL (ref 150–400)
RBC: 4.47 MIL/uL (ref 3.87–5.11)
RDW: 13.1 % (ref 11.5–15.5)
WBC Count: 7.2 K/uL (ref 4.0–10.5)
nRBC: 0 % (ref 0.0–0.2)

## 2024-09-30 MED ORDER — IOHEXOL 300 MG/ML  SOLN
100.0000 mL | Freq: Once | INTRAMUSCULAR | Status: AC | PRN
  Administered 2024-09-30: 100 mL via INTRAVENOUS

## 2024-10-01 ENCOUNTER — Other Ambulatory Visit: Payer: Self-pay

## 2024-10-02 ENCOUNTER — Encounter: Payer: Self-pay | Admitting: Family Medicine

## 2024-10-03 ENCOUNTER — Other Ambulatory Visit: Payer: Self-pay

## 2024-10-03 DIAGNOSIS — I10 Essential (primary) hypertension: Secondary | ICD-10-CM

## 2024-10-03 MED ORDER — LOSARTAN POTASSIUM 50 MG PO TABS: 50.0000 mg | ORAL_TABLET | Freq: Every day | ORAL | 3 refills | Status: AC

## 2024-10-09 ENCOUNTER — Inpatient Hospital Stay: Admitting: Internal Medicine

## 2024-10-09 VITALS — BP 119/70 | HR 84 | Temp 97.6°F | Resp 17 | Ht 60.0 in | Wt 162.0 lb

## 2024-10-09 DIAGNOSIS — C3492 Malignant neoplasm of unspecified part of left bronchus or lung: Secondary | ICD-10-CM | POA: Diagnosis not present

## 2024-10-09 DIAGNOSIS — C3490 Malignant neoplasm of unspecified part of unspecified bronchus or lung: Secondary | ICD-10-CM | POA: Diagnosis not present
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
## 2024-10-24 ENCOUNTER — Other Ambulatory Visit: Payer: Self-pay

## 2024-10-24 NOTE — Progress Notes (Signed)
 Specialty Pharmacy Refill Coordination Note  Elizabeth [name] is a 79 y.o. female contacted today regarding refills of specialty medication(s) Afatinib  Dimaleate (GILOTRIF )   Patient requested Delivery   Delivery date: 11/04/24   Verified address: [address] [facility]. [phone]   Medication will be filled on: 11/03/24  Patient aware of $65 copay and gave pharmacy consent to use cc on file.

## 2024-10-27 ENCOUNTER — Other Ambulatory Visit: Payer: Self-pay

## 2024-10-27 NOTE — Progress Notes (Signed)
 Clinical Intervention Note  Clinical Intervention Notes: Patient reported starting methocarbamol . No DDIs identified with Gilotrif    Clinical Intervention Outcomes: Prevention of an adverse drug event   [employer]vertising Account [employer]anner

## 2024-10-29 ENCOUNTER — Encounter: Payer: Self-pay | Admitting: Family Medicine

## 2024-10-29 LAB — HM DEXA SCAN

## 2024-10-30 ENCOUNTER — Ambulatory Visit: Payer: Self-pay | Admitting: Family Medicine

## 2024-10-30 ENCOUNTER — Other Ambulatory Visit: Payer: Self-pay | Admitting: Family Medicine

## 2024-10-30 ENCOUNTER — Encounter: Payer: Self-pay | Admitting: Family Medicine

## 2024-10-30 DIAGNOSIS — F419 Anxiety disorder, unspecified: Secondary | ICD-10-CM

## 2024-10-30 DIAGNOSIS — G4700 Insomnia, unspecified: Secondary | ICD-10-CM

## 2024-10-30 MED ORDER — ALPRAZOLAM 1 MG PO TABS: ORAL_TABLET | ORAL | 0 refills | Status: AC

## 2024-10-30 NOTE — Progress Notes (Signed)
 Request refill for Alprazolam . PDMP reviewed. Last refill was on 10/01/2024. Needs an appointment before next refill. Last UDS was 07/30/2024. Controlled substance agreement is UTD.

## 2024-11-03 ENCOUNTER — Other Ambulatory Visit: Payer: Self-pay

## 2024-11-11 ENCOUNTER — Ambulatory Visit (INDEPENDENT_AMBULATORY_CARE_PROVIDER_SITE_OTHER): Admitting: Family Medicine

## 2024-11-11 ENCOUNTER — Encounter: Payer: Self-pay | Admitting: Family Medicine

## 2024-11-11 VITALS — BP 130/82 | HR 81 | Temp 98.1°F | Ht 60.0 in | Wt 163.0 lb

## 2024-11-11 DIAGNOSIS — I10 Essential (primary) hypertension: Secondary | ICD-10-CM

## 2024-11-11 DIAGNOSIS — J302 Other seasonal allergic rhinitis: Secondary | ICD-10-CM | POA: Diagnosis not present

## 2024-11-11 DIAGNOSIS — F419 Anxiety disorder, unspecified: Secondary | ICD-10-CM

## 2024-11-11 DIAGNOSIS — E782 Mixed hyperlipidemia: Secondary | ICD-10-CM

## 2024-11-11 DIAGNOSIS — G8929 Other chronic pain: Secondary | ICD-10-CM | POA: Diagnosis not present

## 2024-11-11 DIAGNOSIS — M25561 Pain in right knee: Secondary | ICD-10-CM

## 2024-11-11 DIAGNOSIS — M545 Low back pain, unspecified: Secondary | ICD-10-CM | POA: Diagnosis not present

## 2024-11-11 DIAGNOSIS — E039 Hypothyroidism, unspecified: Secondary | ICD-10-CM

## 2024-11-11 DIAGNOSIS — M25562 Pain in left knee: Secondary | ICD-10-CM | POA: Diagnosis not present

## 2024-11-11 DIAGNOSIS — M8580 Other specified disorders of bone density and structure, unspecified site: Secondary | ICD-10-CM

## 2024-11-11 DIAGNOSIS — C3492 Malignant neoplasm of unspecified part of left bronchus or lung: Secondary | ICD-10-CM | POA: Diagnosis not present

## 2024-11-11 DIAGNOSIS — G4700 Insomnia, unspecified: Secondary | ICD-10-CM | POA: Diagnosis not present

## 2024-11-11 NOTE — Assessment & Plan Note (Signed)
 Stable  Continue Levothyroxine 50 mcg daily

## 2024-11-11 NOTE — Assessment & Plan Note (Signed)
 Stable. Taking Calcium  citrate 500 mg twice daily.

## 2024-11-11 NOTE — Assessment & Plan Note (Signed)
 Continues to see Oncology.

## 2024-11-11 NOTE — Progress Notes (Signed)
 "  Established Patient Office Visit   Subjective:  Patient ID: Elizabeth [name], female    DOB: Feb 27, 1946  Age: 79 y.o. MRN: [id number]  Chief Complaint  Patient presents with   Medical Management of Chronic Issues    3 month follow up    HPI  HTN: Chronic. Is taking Losartan  50 mg and Hydrochlorothiazide  12.5 mg daily. Denies monitoring her blood pressure at home. Denies CP, dizziness, lightheadedness, or lower extremity edema.  SHOB on exertion, but not at rest. CMP last done on 09/30/24 and resulted WNL.   Hyperlipidemia: Chronic. Is taking Zetia  10 mg daily. Never been on a statin for cholesterol.  Lab Results  Component Value Date   CHOL 154 07/30/2024   HDL 48.80 07/30/2024   LDLCALC 89 07/30/2024   TRIG 82.0 07/30/2024   CHOLHDL 3 07/30/2024    Hypothyroidism: Chronic. Is taking Levothyroxine  50 mcg daily. Not changed dose in a long time.  Lab Results  Component Value Date   TSH 4.28 07/30/2024   FREET4 1.38 (H) 07/05/2021    Seasonal allergies: Chronic. Is taking Montelukast  10 mg daily. Effective.    Insomnia/Anxiety: Is taking Alprazolam  1 mg every night for insomnia and anxiety. Sometimes she will take an additional 1/2 tablet to help her sleep. Effective. Takes medication every night.   Lung Cancer: Continues to see Sherrod Sherrod regularly with Oncology. Management is with Oncology.  Chronic Lower Back Pain: In December, started Methocarbonal 500 mg and Meloxicam  15 mg for arthritic pain, prescribed by [employer]. Kidneys stable per last CMP (09/30/24). States that these medications have significantly relieved her symptoms. Also, receiving gel injections every 6 months.   Osteopenia: Is taking Calcium  and Vitamin D . Last bone density scan was 10/2024.   Review of Systems  Constitutional: Negative.   HENT: Negative.    Eyes: Negative.   Respiratory: Negative.    Cardiovascular: Negative.   Gastrointestinal: Negative.   Genitourinary: Negative.    Musculoskeletal: Negative.   Skin: Negative.   Neurological: Negative.   Psychiatric/Behavioral: Negative.      Objective:   BP 130/82   Pulse 81   Temp 98.1 F (36.7 C) (Oral)   Ht 5' (1.524 m)   Wt 73.9 kg   SpO2 98%   BMI 31.83 kg/m  BP Readings from Last 3 Encounters:  11/11/24 130/82  10/09/24 119/70  07/25/24 126/64   Wt Readings from Last 3 Encounters:  11/11/24 73.9 kg  10/09/24 73.5 kg  07/25/24 74.8 kg   Physical Exam Constitutional:      Appearance: Normal appearance.  Cardiovascular:     Rate and Rhythm: Normal rate and regular rhythm.     Heart sounds: Normal heart sounds.  Pulmonary:     Effort: Pulmonary effort is normal.     Breath sounds: Normal breath sounds.  Skin:    General: Skin is warm and dry.  Neurological:     General: No focal deficit present.     Mental Status: She is alert and oriented to person, place, and time. Mental status is at baseline.  Psychiatric:        Mood and Affect: Mood normal.        Behavior: Behavior normal.        Thought Content: Thought content normal.        Judgment: Judgment normal.    The 10-year ASCVD risk score (Arnett DK, et al., 2019) is: 26%   Assessment & Plan:  Essential hypertension Assessment &  Plan: Stable. Continue with Losartan  50mg  and Hydrochlorothiazide  12.5mg  daily. Last CMP on 09/30/24 resulted WNL.    Insomnia, unspecified type Assessment & Plan: Stable. Continue Alprazolam  1mg  every night for insomnia and anxiety. UDS ordered and controlled substance agreement is UTD. PDMP review last done on 10/30/2024.   Anxiety Assessment & Plan: Stable. Continue Alprazolam  1mg  every night for insomnia and anxiety. UDS ordered and controlled substance agreement is UTD. PDMP review last refill was 10/30/2024.  Orders: -     DRUG MONITOR, PANEL 1, SCREEN, URINE  Hyperlipidemia, mixed Assessment & Plan: Stable. Continue taking Zetia  10mg .    Hypothyroidism, unspecified type Assessment &  Plan: Stable. Continue Levothyroxine  50mcg daily.    Seasonal allergies Assessment & Plan: Stable. Continue with Montelukast  10mg  daily.    Chronic bilateral low back pain, unspecified whether sciatica present Assessment & Plan: Stable. In December, started Methocarbonal 500 mg and Meloxicam  15 mg for arthritic pain, prescribed by Orthopedics. Kidneys stable per last CMP (09/30/24).    Adenocarcinoma of left lung, stage 4 (HCC) Assessment & Plan: Continues to see Oncology.    Osteopenia, unspecified location Assessment & Plan: Stable. Taking Calcium  citrate 500 mg twice daily.   Chronic pain of both knees Assessment & Plan: Stable. In December, started Methocarbonal 500 mg and Meloxicam  15 mg for arthritic pain, prescribed by Orthopedics. Kidneys stable per last CMP (09/30/24).    Health Maintenance: Influenza: Had in October Medicare Annual Wellness: Denies  COVID: Denies Zoster: Denies  -Discussed management of chronic conditions. -Continue all medications. -Obtained urine drug screen for Alprazolam  compliance. -PDMP review last done on 10/30/2024 for 45 days. Controlled substance last completed on 01/24/2024. Will complete again at next visit. -Follow up in 3 months for chronic management. Please be fasting, as at next visit we will check labs: TSH, lipids, HgA1c.  Morna Kipper, RN  "

## 2024-11-11 NOTE — Assessment & Plan Note (Signed)
 Stable. Continue with Montelukast 10mg  daily.

## 2024-11-11 NOTE — Assessment & Plan Note (Signed)
 Stable. In December, started Methocarbonal 500 mg and Meloxicam  15 mg for arthritic pain, prescribed by Orthopedics. Kidneys stable per last CMP (09/30/24).

## 2024-11-11 NOTE — Assessment & Plan Note (Addendum)
 Stable. Continue with Losartan  50mg  and Hydrochlorothiazide  12.5mg  daily. Last CMP on 09/30/24 resulted WNL.

## 2024-11-11 NOTE — Assessment & Plan Note (Signed)
 Stable. Continue Alprazolam  1mg  every night for insomnia and anxiety. UDS ordered and controlled substance agreement is UTD. PDMP review last refill was 10/30/2024.

## 2024-11-11 NOTE — Patient Instructions (Addendum)
 It was nice to see you today! Today we: -Discussed management of chronic conditions. -Continue all medications. -Obtained urine drug screen for Alprazolam  compliance. -Follow up in 3 months for chronic management. Please be fasting, as at next visit we will check labs: TSH, lipids, HgA1c.

## 2024-11-11 NOTE — Assessment & Plan Note (Signed)
 Stable. Continue Alprazolam  1mg  every night for insomnia and anxiety. UDS ordered and controlled substance agreement is UTD. PDMP review last done on 10/30/2024.

## 2024-11-11 NOTE — Assessment & Plan Note (Signed)
 Stable. Continue taking Zetia  10mg .

## 2024-11-12 LAB — DRUG MONITOR, PANEL 1, SCREEN, URINE
Amphetamines: NEGATIVE ng/mL
Barbiturates: NEGATIVE ng/mL
Benzodiazepines: POSITIVE ng/mL — AB
Cocaine Metabolite: NEGATIVE ng/mL
Creatinine: 80.5 mg/dL
Marijuana Metabolite: NEGATIVE ng/mL
Methadone Metabolite: NEGATIVE ng/mL
Opiates: NEGATIVE ng/mL
Oxidant: NEGATIVE ug/mL
Oxycodone: NEGATIVE ng/mL
Phencyclidine: NEGATIVE ng/mL
pH: 6 (ref 4.5–9.0)

## 2024-11-12 LAB — DM TEMPLATE

## 2024-11-13 ENCOUNTER — Ambulatory Visit: Payer: Self-pay | Admitting: Family Medicine

## 2024-11-26 ENCOUNTER — Other Ambulatory Visit (HOSPITAL_COMMUNITY): Payer: Self-pay

## 2024-11-27 ENCOUNTER — Other Ambulatory Visit: Payer: Self-pay

## 2024-11-27 NOTE — Progress Notes (Signed)
 Specialty Pharmacy Refill Coordination Note  Elizabeth [name] is a 79 y.o. female contacted today regarding refills of specialty medication(s) Afatinib  Dimaleate (GILOTRIF )   Patient requested Delivery   Delivery date: 12/05/24   Verified address: [address]   Medication will be filled on: 12/04/24

## 2025-01-01 ENCOUNTER — Inpatient Hospital Stay: Admitting: Internal Medicine

## 2025-01-01 ENCOUNTER — Inpatient Hospital Stay: Attending: Internal Medicine

## 2025-02-09 ENCOUNTER — Ambulatory Visit: Admitting: Family Medicine
